# Patient Record
Sex: Male | Born: 1955 | Race: Black or African American | Hispanic: No | Marital: Single | Smoking: Current every day smoker
Health system: Southern US, Community
[De-identification: ages and names within clinical notes are randomized; demographics above are authoritative.]

## PROBLEM LIST (undated history)

## (undated) DIAGNOSIS — I480 Paroxysmal atrial fibrillation: Secondary | ICD-10-CM

## (undated) DIAGNOSIS — Z720 Tobacco use: Secondary | ICD-10-CM

## (undated) DIAGNOSIS — I2510 Atherosclerotic heart disease of native coronary artery without angina pectoris: Secondary | ICD-10-CM

## (undated) DIAGNOSIS — I493 Ventricular premature depolarization: Secondary | ICD-10-CM

## (undated) DIAGNOSIS — N529 Male erectile dysfunction, unspecified: Secondary | ICD-10-CM

## (undated) DIAGNOSIS — I739 Peripheral vascular disease, unspecified: Secondary | ICD-10-CM

## (undated) DIAGNOSIS — I10 Essential (primary) hypertension: Secondary | ICD-10-CM

## (undated) DIAGNOSIS — Z9289 Personal history of other medical treatment: Secondary | ICD-10-CM

## (undated) DIAGNOSIS — M109 Gout, unspecified: Secondary | ICD-10-CM

## (undated) DIAGNOSIS — I5022 Chronic systolic (congestive) heart failure: Secondary | ICD-10-CM

## (undated) DIAGNOSIS — E785 Hyperlipidemia, unspecified: Secondary | ICD-10-CM

## (undated) DIAGNOSIS — I4819 Other persistent atrial fibrillation: Secondary | ICD-10-CM

## (undated) HISTORY — DX: Male erectile dysfunction, unspecified: N52.9

## (undated) HISTORY — DX: Paroxysmal atrial fibrillation: I48.0

## (undated) HISTORY — DX: Peripheral vascular disease, unspecified: I73.9

## (undated) HISTORY — DX: Other persistent atrial fibrillation: I48.19

## (undated) HISTORY — PX: CARDIAC CATHETERIZATION: SHX172

## (undated) HISTORY — DX: Ventricular premature depolarization: I49.3

## (undated) HISTORY — DX: Essential (primary) hypertension: I10

## (undated) HISTORY — DX: Personal history of other medical treatment: Z92.89

---

## 2005-03-14 ENCOUNTER — Emergency Department: Payer: Self-pay | Admitting: General Practice

## 2005-04-09 ENCOUNTER — Other Ambulatory Visit: Payer: Self-pay

## 2005-04-09 ENCOUNTER — Emergency Department: Payer: Self-pay | Admitting: Emergency Medicine

## 2005-10-10 ENCOUNTER — Emergency Department: Payer: Self-pay | Admitting: Emergency Medicine

## 2006-07-20 ENCOUNTER — Emergency Department: Payer: Self-pay | Admitting: Emergency Medicine

## 2006-12-06 ENCOUNTER — Emergency Department: Payer: Self-pay | Admitting: Emergency Medicine

## 2007-05-06 ENCOUNTER — Emergency Department: Payer: Self-pay | Admitting: Emergency Medicine

## 2007-12-24 ENCOUNTER — Emergency Department: Payer: Self-pay | Admitting: Emergency Medicine

## 2008-02-01 ENCOUNTER — Emergency Department: Payer: Self-pay | Admitting: Emergency Medicine

## 2008-02-17 ENCOUNTER — Emergency Department: Payer: Self-pay | Admitting: Emergency Medicine

## 2008-09-02 ENCOUNTER — Emergency Department: Payer: Self-pay | Admitting: Emergency Medicine

## 2009-01-15 ENCOUNTER — Emergency Department: Payer: Self-pay | Admitting: Emergency Medicine

## 2009-01-15 ENCOUNTER — Emergency Department: Payer: Self-pay

## 2009-02-02 ENCOUNTER — Emergency Department: Payer: Self-pay | Admitting: Unknown Physician Specialty

## 2009-06-03 ENCOUNTER — Emergency Department: Payer: Self-pay | Admitting: Internal Medicine

## 2009-10-04 ENCOUNTER — Inpatient Hospital Stay: Payer: Self-pay | Admitting: Internal Medicine

## 2009-11-04 ENCOUNTER — Emergency Department: Payer: Self-pay | Admitting: Unknown Physician Specialty

## 2010-06-29 ENCOUNTER — Emergency Department: Payer: Self-pay | Admitting: Emergency Medicine

## 2010-07-18 ENCOUNTER — Emergency Department: Payer: Self-pay | Admitting: Emergency Medicine

## 2010-08-15 ENCOUNTER — Emergency Department: Payer: Self-pay | Admitting: Emergency Medicine

## 2010-08-16 ENCOUNTER — Emergency Department: Payer: Self-pay | Admitting: Emergency Medicine

## 2010-08-31 ENCOUNTER — Inpatient Hospital Stay: Payer: Self-pay | Admitting: Psychiatry

## 2010-11-01 ENCOUNTER — Emergency Department: Payer: Self-pay | Admitting: Unknown Physician Specialty

## 2010-11-11 ENCOUNTER — Emergency Department: Payer: Self-pay | Admitting: Unknown Physician Specialty

## 2010-12-17 ENCOUNTER — Emergency Department: Payer: Self-pay | Admitting: Emergency Medicine

## 2011-01-09 ENCOUNTER — Emergency Department: Payer: Self-pay | Admitting: Unknown Physician Specialty

## 2011-01-30 ENCOUNTER — Emergency Department: Payer: Self-pay | Admitting: Emergency Medicine

## 2011-02-10 ENCOUNTER — Inpatient Hospital Stay: Payer: Self-pay | Admitting: Internal Medicine

## 2011-02-10 DIAGNOSIS — I4891 Unspecified atrial fibrillation: Secondary | ICD-10-CM

## 2011-07-06 ENCOUNTER — Emergency Department: Payer: Self-pay | Admitting: Emergency Medicine

## 2011-07-06 LAB — COMPREHENSIVE METABOLIC PANEL
Albumin: 4.2 g/dL (ref 3.4–5.0)
BUN: 11 mg/dL (ref 7–18)
Calcium, Total: 8.7 mg/dL (ref 8.5–10.1)
Chloride: 105 mmol/L (ref 98–107)
Creatinine: 0.88 mg/dL (ref 0.60–1.30)
EGFR (African American): >60
EGFR (Non-African Amer.): >60
Glucose: 59 mg/dL — ABNORMAL LOW (ref 65–99)
Osmolality: 280 (ref 275–301)
SGOT(AST): 36 U/L (ref 15–37)
SGPT (ALT): 24 U/L
Total Protein: 8.2 g/dL (ref 6.4–8.2)

## 2011-07-06 LAB — CBC
HCT: 44 % (ref 40.0–52.0)
HGB: 14.9 g/dL (ref 13.0–18.0)
MCH: 32.2 pg (ref 26.0–34.0)
MCHC: 33.8 g/dL (ref 32.0–36.0)
MCV: 95 fL (ref 80–100)

## 2011-07-06 LAB — ETHANOL: Ethanol %: 0.327 % (ref 0.000–0.080)

## 2011-07-07 LAB — DRUG SCREEN, URINE
Cannabinoid 50 Ng, Ur ~~LOC~~: NEGATIVE (ref ?–50)
Cocaine Metabolite,Ur ~~LOC~~: NEGATIVE (ref ?–300)
MDMA (Ecstasy)Ur Screen: NEGATIVE (ref ?–500)
Phencyclidine (PCP) Ur S: NEGATIVE (ref ?–25)

## 2011-07-08 ENCOUNTER — Emergency Department: Payer: Self-pay | Admitting: Emergency Medicine

## 2011-07-08 LAB — ACETAMINOPHEN LEVEL: Acetaminophen: <2 ug/mL

## 2011-07-08 LAB — URINALYSIS, COMPLETE
Bacteria: NONE SEEN
Blood: NEGATIVE
Ketone: NEGATIVE
Protein: NEGATIVE
Specific Gravity: 1.002 (ref 1.003–1.030)
WBC UR: <1 /HPF (ref 0–5)

## 2011-07-08 LAB — DRUG SCREEN, URINE
Amphetamines, Ur Screen: NEGATIVE (ref ?–1000)
Barbiturates, Ur Screen: NEGATIVE (ref ?–200)
Benzodiazepine, Ur Scrn: NEGATIVE (ref ?–200)
Cannabinoid 50 Ng, Ur ~~LOC~~: NEGATIVE (ref ?–50)
Cocaine Metabolite,Ur ~~LOC~~: NEGATIVE (ref ?–300)
MDMA (Ecstasy)Ur Screen: NEGATIVE (ref ?–500)

## 2011-07-08 LAB — CBC
HGB: 14.5 g/dL (ref 13.0–18.0)
MCH: 32.3 pg (ref 26.0–34.0)
MCHC: 34.4 g/dL (ref 32.0–36.0)
RDW: 14.9 % — ABNORMAL HIGH (ref 11.5–14.5)

## 2011-07-08 LAB — COMPREHENSIVE METABOLIC PANEL
Alkaline Phosphatase: 81 U/L (ref 50–136)
BUN: 7 mg/dL (ref 7–18)
Bilirubin,Total: 0.3 mg/dL (ref 0.2–1.0)
Calcium, Total: 8.5 mg/dL (ref 8.5–10.1)
Chloride: 100 mmol/L (ref 98–107)
EGFR (Non-African Amer.): >60
Osmolality: 268 (ref 275–301)
Potassium: 3.6 mmol/L (ref 3.5–5.1)
SGOT(AST): 44 U/L — ABNORMAL HIGH (ref 15–37)
Sodium: 135 mmol/L — ABNORMAL LOW (ref 136–145)
Total Protein: 7.5 g/dL (ref 6.4–8.2)

## 2011-07-08 LAB — TSH: Thyroid Stimulating Horm: 1.18 u[IU]/mL

## 2011-07-09 LAB — ETHANOL
Ethanol %: <0.003 % (ref 0.000–0.080)
Ethanol: <3 mg/dL

## 2011-08-08 ENCOUNTER — Emergency Department: Payer: Self-pay | Admitting: Emergency Medicine

## 2011-08-08 LAB — URINALYSIS, COMPLETE
Bilirubin,UR: NEGATIVE
Blood: NEGATIVE
Glucose,UR: NEGATIVE mg/dL (ref 0–75)
Ketone: NEGATIVE
Ph: 6 (ref 4.5–8.0)
Squamous Epithelial: NONE SEEN
WBC UR: NONE SEEN /HPF (ref 0–5)

## 2011-08-08 LAB — CBC
HGB: 13.8 g/dL (ref 13.0–18.0)
MCH: 32.4 pg (ref 26.0–34.0)
MCHC: 34 g/dL (ref 32.0–36.0)
MCV: 95 fL (ref 80–100)
Platelet: 226 10*3/uL (ref 150–440)
RDW: 15.3 % — ABNORMAL HIGH (ref 11.5–14.5)

## 2011-08-08 LAB — COMPREHENSIVE METABOLIC PANEL
Albumin: 4.3 g/dL (ref 3.4–5.0)
Alkaline Phosphatase: 71 U/L (ref 50–136)
Calcium, Total: 8.9 mg/dL (ref 8.5–10.1)
Co2: 21 mmol/L (ref 21–32)
Creatinine: 0.85 mg/dL (ref 0.60–1.30)
EGFR (African American): >60
EGFR (Non-African Amer.): >60
Glucose: 69 mg/dL (ref 65–99)
Osmolality: 268 (ref 275–301)
SGOT(AST): 55 U/L — ABNORMAL HIGH (ref 15–37)

## 2011-08-08 LAB — DRUG SCREEN, URINE
Amphetamines, Ur Screen: NEGATIVE (ref ?–1000)
Benzodiazepine, Ur Scrn: NEGATIVE (ref ?–200)
Cannabinoid 50 Ng, Ur ~~LOC~~: NEGATIVE (ref ?–50)
Cocaine Metabolite,Ur ~~LOC~~: NEGATIVE (ref ?–300)
MDMA (Ecstasy)Ur Screen: NEGATIVE (ref ?–500)
Phencyclidine (PCP) Ur S: NEGATIVE (ref ?–25)
Tricyclic, Ur Screen: NEGATIVE (ref ?–1000)

## 2011-08-08 LAB — TROPONIN I: Troponin-I: 0.02 ng/mL

## 2011-08-08 LAB — SALICYLATE LEVEL: Salicylates, Serum: 5.2 mg/dL — ABNORMAL HIGH

## 2011-08-09 LAB — ETHANOL
Ethanol %: 0.125 % — ABNORMAL HIGH (ref 0.000–0.080)
Ethanol: 125 mg/dL

## 2011-09-07 ENCOUNTER — Emergency Department: Payer: Self-pay | Admitting: Emergency Medicine

## 2011-09-07 LAB — COMPREHENSIVE METABOLIC PANEL
Alkaline Phosphatase: 72 U/L (ref 50–136)
Anion Gap: 9 (ref 7–16)
BUN: 10 mg/dL (ref 7–18)
Co2: 27 mmol/L (ref 21–32)
Creatinine: 0.99 mg/dL (ref 0.60–1.30)
EGFR (Non-African Amer.): >60
Glucose: 80 mg/dL (ref 65–99)
Potassium: 4.7 mmol/L (ref 3.5–5.1)
SGPT (ALT): 44 U/L
Sodium: 140 mmol/L (ref 136–145)

## 2011-09-07 LAB — CBC
HCT: 43.3 % (ref 40.0–52.0)
HGB: 14.3 g/dL (ref 13.0–18.0)
MCH: 31.5 pg (ref 26.0–34.0)
MCHC: 33 g/dL (ref 32.0–36.0)
Platelet: 224 10*3/uL (ref 150–440)
RBC: 4.53 10*6/uL (ref 4.40–5.90)
WBC: 5.2 10*3/uL (ref 3.8–10.6)

## 2011-09-07 LAB — DRUG SCREEN, URINE
Barbiturates, Ur Screen: NEGATIVE (ref ?–200)
MDMA (Ecstasy)Ur Screen: NEGATIVE (ref ?–500)
Methadone, Ur Screen: NEGATIVE (ref ?–300)
Phencyclidine (PCP) Ur S: NEGATIVE (ref ?–25)
Tricyclic, Ur Screen: NEGATIVE (ref ?–1000)

## 2011-09-07 LAB — URINALYSIS, COMPLETE
Bacteria: NONE SEEN
Ph: 5 (ref 4.5–8.0)
Protein: NEGATIVE
Specific Gravity: 1.017 (ref 1.003–1.030)
Squamous Epithelial: 1
WBC UR: <1 /HPF (ref 0–5)

## 2011-09-07 LAB — ETHANOL: Ethanol: 5 mg/dL

## 2011-09-07 LAB — TSH: Thyroid Stimulating Horm: 0.334 u[IU]/mL — ABNORMAL LOW

## 2011-11-19 ENCOUNTER — Emergency Department: Payer: Self-pay | Admitting: Emergency Medicine

## 2011-12-05 ENCOUNTER — Inpatient Hospital Stay: Payer: Self-pay | Admitting: Internal Medicine

## 2011-12-05 LAB — COMPREHENSIVE METABOLIC PANEL
Albumin: 3.6 g/dL (ref 3.4–5.0)
Alkaline Phosphatase: 70 U/L (ref 50–136)
Calcium, Total: 8.5 mg/dL (ref 8.5–10.1)
Co2: 29 mmol/L (ref 21–32)
Creatinine: 1.42 mg/dL — ABNORMAL HIGH (ref 0.60–1.30)
Osmolality: 284 (ref 275–301)
Potassium: 4.1 mmol/L (ref 3.5–5.1)
SGOT(AST): 24 U/L (ref 15–37)
Sodium: 140 mmol/L (ref 136–145)
Total Protein: 6.9 g/dL (ref 6.4–8.2)

## 2011-12-05 LAB — PRO B NATRIURETIC PEPTIDE: B-Type Natriuretic Peptide: 157 pg/mL — ABNORMAL HIGH (ref 0–125)

## 2011-12-05 LAB — CK TOTAL AND CKMB (NOT AT ARMC)
CK, Total: 212 U/L (ref 35–232)
CK, Total: 168 U/L (ref 35–232)
CK-MB: 2.3 ng/mL (ref 0.5–3.6)
CK-MB: 2.5 ng/mL (ref 0.5–3.6)

## 2011-12-05 LAB — PROTIME-INR
INR: 0.8
Prothrombin Time: 11.2 secs — ABNORMAL LOW (ref 11.5–14.7)

## 2011-12-05 LAB — CBC
HCT: 43.4 % (ref 40.0–52.0)
HGB: 14.7 g/dL (ref 13.0–18.0)
MCHC: 33.9 g/dL (ref 32.0–36.0)
Platelet: 264 10*3/uL (ref 150–440)
RBC: 4.71 10*6/uL (ref 4.40–5.90)
RDW: 13.3 % (ref 11.5–14.5)

## 2011-12-06 LAB — LIPID PANEL
HDL Cholesterol: 34 mg/dL — ABNORMAL LOW (ref 40–60)
Ldl Cholesterol, Calc: 124 mg/dL — ABNORMAL HIGH (ref 0–100)
Triglycerides: 381 mg/dL — ABNORMAL HIGH (ref 0–200)
VLDL Cholesterol, Calc: 76 mg/dL — ABNORMAL HIGH (ref 5–40)

## 2011-12-06 LAB — CK TOTAL AND CKMB (NOT AT ARMC): CK-MB: 1.8 ng/mL (ref 0.5–3.6)

## 2011-12-06 LAB — TROPONIN I: Troponin-I: 0.03 ng/mL

## 2012-10-25 ENCOUNTER — Emergency Department: Payer: Self-pay | Admitting: Internal Medicine

## 2013-04-04 LAB — TSH: TSH: 1.13 u[IU]/mL (ref 0.41–5.90)

## 2013-04-16 ENCOUNTER — Emergency Department: Payer: Self-pay | Admitting: Emergency Medicine

## 2013-10-26 ENCOUNTER — Emergency Department: Payer: Self-pay | Admitting: Emergency Medicine

## 2013-10-26 LAB — URINALYSIS, COMPLETE
Bacteria: NONE SEEN
Bilirubin,UR: NEGATIVE
Blood: NEGATIVE
Glucose,UR: NEGATIVE mg/dL (ref 0–75)
Ketone: NEGATIVE
Nitrite: NEGATIVE
PH: 5 (ref 4.5–8.0)
PROTEIN: NEGATIVE
SQUAMOUS EPITHELIAL: NONE SEEN
Specific Gravity: 1.013 (ref 1.003–1.030)
WBC UR: 6 /HPF (ref 0–5)

## 2013-10-26 LAB — COMPREHENSIVE METABOLIC PANEL
ALBUMIN: 3.8 g/dL (ref 3.4–5.0)
AST: 28 U/L (ref 15–37)
Alkaline Phosphatase: 92 U/L
Anion Gap: 2 — ABNORMAL LOW (ref 7–16)
BUN: 14 mg/dL (ref 7–18)
Bilirubin,Total: 0.2 mg/dL (ref 0.2–1.0)
CO2: 28 mmol/L (ref 21–32)
Calcium, Total: 8.8 mg/dL (ref 8.5–10.1)
Chloride: 111 mmol/L — ABNORMAL HIGH (ref 98–107)
Creatinine: 1.2 mg/dL (ref 0.60–1.30)
EGFR (African American): >60
EGFR (Non-African Amer.): >60
Glucose: 63 mg/dL — ABNORMAL LOW (ref 65–99)
Osmolality: 280 (ref 275–301)
POTASSIUM: 3.9 mmol/L (ref 3.5–5.1)
SGPT (ALT): 25 U/L (ref 12–78)
Sodium: 141 mmol/L (ref 136–145)
Total Protein: 7.3 g/dL (ref 6.4–8.2)

## 2013-10-26 LAB — TROPONIN I
Troponin-I: <0.02 ng/mL
Troponin-I: <0.02 ng/mL

## 2013-10-26 LAB — CBC WITH DIFFERENTIAL/PLATELET
Basophil #: 0.1 10*3/uL (ref 0.0–0.1)
Basophil %: 1.1 %
Eosinophil #: 0.2 10*3/uL (ref 0.0–0.7)
Eosinophil %: 2.4 %
HCT: 46.7 % (ref 40.0–52.0)
HGB: 15.3 g/dL (ref 13.0–18.0)
Lymphocyte #: 3.1 10*3/uL (ref 1.0–3.6)
Lymphocyte %: 37.7 %
MCH: 29.8 pg (ref 26.0–34.0)
MCHC: 32.7 g/dL (ref 32.0–36.0)
MCV: 91 fL (ref 80–100)
Monocyte #: 0.7 x10 3/mm (ref 0.2–1.0)
Monocyte %: 8.6 %
NEUTROS PCT: 50.2 %
Neutrophil #: 4.1 10*3/uL (ref 1.4–6.5)
Platelet: 276 10*3/uL (ref 150–440)
RBC: 5.13 10*6/uL (ref 4.40–5.90)
RDW: 15.3 % — AB (ref 11.5–14.5)
WBC: 8.2 10*3/uL (ref 3.8–10.6)

## 2013-10-26 LAB — MAGNESIUM: Magnesium: 1.8 mg/dL

## 2013-10-26 LAB — CK TOTAL AND CKMB (NOT AT ARMC)
CK, TOTAL: 266 U/L
CK, Total: 237 U/L
CK-MB: 2.4 ng/mL (ref 0.5–3.6)
CK-MB: 2.1 ng/mL (ref 0.5–3.6)

## 2013-10-26 LAB — PROTIME-INR
INR: 0.9
Prothrombin Time: 11.9 secs (ref 11.5–14.7)

## 2013-10-26 LAB — TSH: Thyroid Stimulating Horm: 0.95 u[IU]/mL

## 2013-10-26 LAB — APTT: Activated PTT: 30.1 secs (ref 23.6–35.9)

## 2014-01-27 ENCOUNTER — Encounter (HOSPITAL_COMMUNITY): Payer: Self-pay | Admitting: Cardiovascular Disease

## 2014-01-27 ENCOUNTER — Ambulatory Visit: Payer: Self-pay | Admitting: Internal Medicine

## 2014-01-27 ENCOUNTER — Inpatient Hospital Stay (HOSPITAL_COMMUNITY)
Admission: EM | Admit: 2014-01-27 | Discharge: 2014-01-29 | DRG: 282 | Disposition: A | Discharge status: Home / Self Care | Payer: BC Managed Care – PPO | Attending: Cardiovascular Disease | Admitting: Cardiovascular Disease

## 2014-01-27 ENCOUNTER — Inpatient Hospital Stay
Admission: EM | Admit: 2014-01-27 | Payer: Self-pay | Source: Other Acute Inpatient Hospital | Admitting: Cardiovascular Disease

## 2014-01-27 ENCOUNTER — Encounter (HOSPITAL_COMMUNITY)
Admission: EM | Disposition: A | Discharge status: Home / Self Care | Payer: Self-pay | Source: Home / Self Care | Attending: Cardiovascular Disease

## 2014-01-27 ENCOUNTER — Inpatient Hospital Stay (HOSPITAL_COMMUNITY): Payer: BC Managed Care – PPO

## 2014-01-27 DIAGNOSIS — Z7901 Long term (current) use of anticoagulants: Secondary | ICD-10-CM

## 2014-01-27 DIAGNOSIS — Z7982 Long term (current) use of aspirin: Secondary | ICD-10-CM | POA: Diagnosis not present

## 2014-01-27 DIAGNOSIS — F1021 Alcohol dependence, in remission: Secondary | ICD-10-CM

## 2014-01-27 DIAGNOSIS — Z8249 Family history of ischemic heart disease and other diseases of the circulatory system: Secondary | ICD-10-CM

## 2014-01-27 DIAGNOSIS — I2109 ST elevation (STEMI) myocardial infarction involving other coronary artery of anterior wall: Principal | ICD-10-CM | POA: Diagnosis present

## 2014-01-27 DIAGNOSIS — R079 Chest pain, unspecified: Secondary | ICD-10-CM | POA: Diagnosis present

## 2014-01-27 DIAGNOSIS — F17200 Nicotine dependence, unspecified, uncomplicated: Secondary | ICD-10-CM | POA: Diagnosis present

## 2014-01-27 DIAGNOSIS — Z716 Tobacco abuse counseling: Secondary | ICD-10-CM

## 2014-01-27 DIAGNOSIS — Z720 Tobacco use: Secondary | ICD-10-CM | POA: Diagnosis present

## 2014-01-27 DIAGNOSIS — I214 Non-ST elevation (NSTEMI) myocardial infarction: Secondary | ICD-10-CM

## 2014-01-27 DIAGNOSIS — I480 Paroxysmal atrial fibrillation: Secondary | ICD-10-CM

## 2014-01-27 DIAGNOSIS — E785 Hyperlipidemia, unspecified: Secondary | ICD-10-CM | POA: Diagnosis present

## 2014-01-27 DIAGNOSIS — R7309 Other abnormal glucose: Secondary | ICD-10-CM | POA: Diagnosis present

## 2014-01-27 DIAGNOSIS — Z79899 Other long term (current) drug therapy: Secondary | ICD-10-CM

## 2014-01-27 DIAGNOSIS — I4891 Unspecified atrial fibrillation: Secondary | ICD-10-CM | POA: Diagnosis present

## 2014-01-27 DIAGNOSIS — I213 ST elevation (STEMI) myocardial infarction of unspecified site: Secondary | ICD-10-CM

## 2014-01-27 DIAGNOSIS — I2510 Atherosclerotic heart disease of native coronary artery without angina pectoris: Secondary | ICD-10-CM

## 2014-01-27 DIAGNOSIS — I10 Essential (primary) hypertension: Secondary | ICD-10-CM | POA: Diagnosis present

## 2014-01-27 HISTORY — PX: LEFT HEART CATH: SHX5478

## 2014-01-27 HISTORY — DX: Hyperlipidemia, unspecified: E78.5

## 2014-01-27 HISTORY — DX: Atherosclerotic heart disease of native coronary artery without angina pectoris: I25.10

## 2014-01-27 HISTORY — DX: Tobacco use: Z72.0

## 2014-01-27 LAB — CBC WITH DIFFERENTIAL/PLATELET
BASOS ABS: 0 10*3/uL (ref 0.0–0.1)
BASOS PCT: 0.6 %
Basophil #: 0 10*3/uL (ref 0.0–0.1)
Basophils Relative: 0 % (ref 0–1)
EOS ABS: 0.1 10*3/uL (ref 0.0–0.7)
EOS ABS: 0.1 10*3/uL (ref 0.0–0.7)
EOS PCT: 1.5 %
Eosinophils Relative: 1 % (ref 0–5)
HCT: 49.5 % (ref 40.0–52.0)
HCT: 42.6 % (ref 39.0–52.0)
HGB: 15.7 g/dL (ref 13.0–18.0)
Hemoglobin: 14.5 g/dL (ref 13.0–17.0)
LYMPHS ABS: 3.3 10*3/uL (ref 1.0–3.6)
LYMPHS ABS: 3.1 10*3/uL (ref 0.7–4.0)
LYMPHS PCT: 46.4 %
LYMPHS PCT: 47 % — AB (ref 12–46)
MCH: 29.3 pg (ref 26.0–34.0)
MCH: 29.8 pg (ref 26.0–34.0)
MCHC: 31.7 g/dL — AB (ref 32.0–36.0)
MCHC: 34 g/dL (ref 30.0–36.0)
MCV: 92 fL (ref 80–100)
MCV: 87.5 fL (ref 78.0–100.0)
Monocyte #: 0.6 x10 3/mm (ref 0.2–1.0)
Monocyte %: 8.1 %
Monocytes Absolute: 0.5 10*3/uL (ref 0.1–1.0)
Monocytes Relative: 8 % (ref 3–12)
NEUTROS ABS: 3.1 10*3/uL (ref 1.4–6.5)
NEUTROS PCT: 43.4 %
NEUTROS PCT: 44 % (ref 43–77)
Neutro Abs: 3 10*3/uL (ref 1.7–7.7)
Platelet: 284 10*3/uL (ref 150–440)
Platelets: 280 10*3/uL (ref 150–400)
RBC: 5.36 10*6/uL (ref 4.40–5.90)
RBC: 4.87 MIL/uL (ref 4.22–5.81)
RDW: 15.4 % — ABNORMAL HIGH (ref 11.5–14.5)
RDW: 16.2 % — AB (ref 11.5–15.5)
WBC: 7.1 10*3/uL (ref 3.8–10.6)
WBC: 6.8 10*3/uL (ref 4.0–10.5)

## 2014-01-27 LAB — COMPREHENSIVE METABOLIC PANEL
ALBUMIN: 3.6 g/dL (ref 3.4–5.0)
ALK PHOS: 77 U/L (ref 39–117)
ALT: 16 U/L (ref 0–53)
AST: 28 U/L (ref 15–37)
AST: 18 U/L (ref 0–37)
Albumin: 3.4 g/dL — ABNORMAL LOW (ref 3.5–5.2)
Alkaline Phosphatase: 87 U/L
Anion Gap: 8 (ref 7–16)
Anion gap: 10 (ref 5–15)
BUN: 10 mg/dL (ref 7–18)
BUN: 11 mg/dL (ref 6–23)
Bilirubin,Total: 0.4 mg/dL (ref 0.2–1.0)
CHLORIDE: 108 mmol/L — AB (ref 98–107)
CHLORIDE: 107 meq/L (ref 96–112)
CO2: 27 mmol/L (ref 21–32)
CO2: 23 mEq/L (ref 19–32)
Calcium, Total: 9 mg/dL (ref 8.5–10.1)
Calcium: 8.7 mg/dL (ref 8.4–10.5)
Creatinine, Ser: 0.94 mg/dL (ref 0.50–1.35)
Creatinine: 1.34 mg/dL — ABNORMAL HIGH (ref 0.60–1.30)
EGFR (Non-African Amer.): 58 — ABNORMAL LOW
GFR calc non Af Amer: >90 mL/min (ref >90)
GLUCOSE: 84 mg/dL (ref 70–99)
Glucose: 100 mg/dL — ABNORMAL HIGH (ref 65–99)
Osmolality: 284 (ref 275–301)
POTASSIUM: 4.1 meq/L (ref 3.7–5.3)
Potassium: 3.8 mmol/L (ref 3.5–5.1)
SGPT (ALT): 24 U/L
Sodium: 143 mmol/L (ref 136–145)
Sodium: 140 mEq/L (ref 137–147)
TOTAL PROTEIN: 7 g/dL (ref 6.4–8.2)
Total Bilirubin: 0.3 mg/dL (ref 0.3–1.2)
Total Protein: 6.1 g/dL (ref 6.0–8.3)

## 2014-01-27 LAB — CK TOTAL AND CKMB (NOT AT ARMC)
CK, TOTAL: 170 U/L
CK-MB: 1.8 ng/mL (ref 0.5–3.6)

## 2014-01-27 LAB — TROPONIN I
Troponin I: <0.3 ng/mL (ref <0.30)
Troponin I: <0.3 ng/mL (ref <0.30)
Troponin-I: <0.02 ng/mL

## 2014-01-27 LAB — PROTIME-INR
INR: 0.9
Prothrombin Time: 12.4 secs (ref 11.5–14.7)

## 2014-01-27 LAB — TSH: TSH: 0.64 u[IU]/mL (ref 0.350–4.500)

## 2014-01-27 LAB — MRSA PCR SCREENING: MRSA by PCR: NEGATIVE

## 2014-01-27 LAB — PRO B NATRIURETIC PEPTIDE: Pro B Natriuretic peptide (BNP): 145.5 pg/mL — ABNORMAL HIGH (ref 0–125)

## 2014-01-27 SURGERY — LEFT HEART CATH
Anesthesia: LOCAL | Laterality: Right

## 2014-01-27 MED ORDER — LIDOCAINE HCL (PF) 1 % IJ SOLN
INTRAMUSCULAR | Status: AC
  Filled 2014-01-27: qty 30

## 2014-01-27 MED ORDER — HEPARIN SODIUM (PORCINE) 1000 UNIT/ML IJ SOLN
INTRAMUSCULAR | Status: AC
  Filled 2014-01-27: qty 1

## 2014-01-27 MED ORDER — ACETAMINOPHEN 325 MG PO TABS: 650.0000 mg | ORAL_TABLET | ORAL | Status: DC | PRN

## 2014-01-27 MED ORDER — DILTIAZEM LOAD VIA INFUSION
20.0000 mg | Freq: Once | INTRAVENOUS | Status: AC
  Administered 2014-01-27: 20 mg via INTRAVENOUS
  Filled 2014-01-27: qty 20

## 2014-01-27 MED ORDER — DILTIAZEM HCL 60 MG PO TABS
60.0000 mg | ORAL_TABLET | Freq: Three times a day (TID) | ORAL | Status: DC
  Administered 2014-01-27 – 2014-01-28 (×2): 60 mg via ORAL
  Filled 2014-01-27 (×5): qty 1

## 2014-01-27 MED ORDER — HEPARIN (PORCINE) IN NACL 2-0.9 UNIT/ML-% IJ SOLN
INTRAMUSCULAR | Status: AC
  Filled 2014-01-27: qty 1000

## 2014-01-27 MED ORDER — ONDANSETRON HCL 4 MG/2ML IJ SOLN: 4.0000 mg | Freq: Four times a day (QID) | INTRAMUSCULAR | Status: DC | PRN

## 2014-01-27 MED ORDER — MIDAZOLAM HCL 2 MG/2ML IJ SOLN
INTRAMUSCULAR | Status: AC
  Filled 2014-01-27: qty 2

## 2014-01-27 MED ORDER — ASPIRIN EC 81 MG PO TBEC
81.0000 mg | DELAYED_RELEASE_TABLET | Freq: Every day | ORAL | Status: DC
  Administered 2014-01-27 – 2014-01-29 (×3): 81 mg via ORAL
  Filled 2014-01-27 (×3): qty 1

## 2014-01-27 MED ORDER — FENTANYL CITRATE 0.05 MG/ML IJ SOLN
INTRAMUSCULAR | Status: AC
  Filled 2014-01-27: qty 2

## 2014-01-27 MED ORDER — SODIUM CHLORIDE 0.9 % IV SOLN
INTRAVENOUS | Status: AC
  Administered 2014-01-27: 17:00:00 via INTRAVENOUS

## 2014-01-27 MED ORDER — METOPROLOL TARTRATE 1 MG/ML IV SOLN
INTRAVENOUS | Status: AC
  Filled 2014-01-27: qty 5

## 2014-01-27 MED ORDER — DILTIAZEM HCL 100 MG IV SOLR
5.0000 mg/h | INTRAVENOUS | Status: DC
  Administered 2014-01-27: 5 mg/h via INTRAVENOUS
  Filled 2014-01-27: qty 100

## 2014-01-27 MED ORDER — SODIUM CHLORIDE 0.9 % IV SOLN
INTRAVENOUS | Status: DC
  Administered 2014-01-27: 1000 mL via INTRAVENOUS

## 2014-01-27 MED ORDER — VERAPAMIL HCL 2.5 MG/ML IV SOLN
INTRAVENOUS | Status: AC
  Filled 2014-01-27: qty 2

## 2014-01-27 NOTE — ED Provider Notes (Signed)
Pt seen on arrival with dr cooper with cardiology Dr Excell Seltzer has decided to take patient emergently to cardiac cath lab. Pt awake/alert, stable for transport to cath lab   Joya Gaskins, MD 01/27/14 [phone]

## 2014-01-27 NOTE — Progress Notes (Addendum)
eLink Physician-Brief Progress Note Patient Name: Darren Rogers DOB: [birth year] MRN: [id number]   Date of Service  01/27/2014  HPI/Events of Note  Patient presented with palpatations and changes on EKG concerning for acute MI.  Patient also noted to have Afib.  Emergent cath performed and nonobstructive CAD found.  Patient on diltiazem drip and stable from hemodynamic and resp standpoint. Need for anticoagulation addressed by cardiology.  eICU Interventions  No acute interventions needed.  Chart reviewed.       Intervention Category Evaluation Type: New Patient Evaluation  Henry Russel, P 01/27/2014, 5:07 PM

## 2014-01-27 NOTE — H&P (Signed)
History and Physical  Patient ID: Darren Rogers MRN: [id number], SOB: 01-25-1956 58 y.o. Date of Encounter: 01/27/2014, 3:26 PM  Primary Physician: No primary provider on file. Primary Cardiologist: none  Chief Complaint: Chest pain/palpitations  HPI: 58 y.o. male w/ PMHx significant for palpitations who presented to [facility] on 01/27/2014 with complaints of chest pain.  The patient awoke this morning with palpitations. He has had this symptom periodically, last about 3 months ago. He has been told that he has atrial fibrillation in the past. The patient has never been on anticoagulant drugs. He went on to develop substernal chest discomfort this morning. He describes this as a pressure-like sensation. He went to the emergency department at [facility]. He was noted to have anteroseptal ST segment elevation and a code STEMI was called. The patient was given sublingual nitroglycerin and his chest pain improved with this. Upon arrival here, he is chest pain-free. Recheck another EKG but he continues to have residual ST segment elevation. We have elected to proceed with emergency cardiac catheterization and possible PCI.  The patient denies shortness of breath, orthopnea, or PND. He's had no diaphoresis, nausea, or vomiting. He denies any history of stroke or TIA. He denies any bleeding problems.  The patient takes simvastatin, diltiazem, and aspirin as his home medications. He denies any drug allergies.   Past Medical History  Diagnosis Date  . Hypertension   . Hyperlipidemia   . Tobacco abuse      Surgical History: No past surgical history on file.   Home Meds: Prior to Admission medications   Not on File    Allergies: Allergies not on file  History   Social History  . Marital Status: Single    Spouse Name: N/A    Number of Children: N/A  . Years of Education: N/A   Occupational History  . Not on file.   Social History Main Topics  .  Smoking status: Not on file  . Smokeless tobacco: Not on file  . Alcohol Use: Not on file  . Drug Use: Not on file  . Sexual Activity: Not on file   Other Topics Concern  . Not on file   Social History Narrative   The patient lives in [place] with his sister. He works in a substance abuse program. 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 He is a long-time smoker, one pack per day. No illicit drugs. He is not married.     Family History  Problem Relation Age of Onset  . Coronary artery disease Father 68    Review of Systems: General: negative for chills, fever, night sweats or weight changes.  ENT: negative for rhinorrhea or epistaxis Cardiovascular: See history of present illness Dermatological: negative for rash Respiratory: negative for cough or wheezing GI: negative for nausea, vomiting, diarrhea, bright red blood per rectum, melena, or hematemesis GU: no hematuria, urgency, or frequency Neurologic: negative for visual changes, syncope, headache, or dizziness Heme: no easy bruising or bleeding Endo: negative for excessive thirst, thyroid disorder, or flushing Musculoskeletal: negative for joint pain or swelling, negative for myalgias All other systems reviewed and are otherwise negative except as noted above.  Physical Exam: There were no vitals taken for this visit. General: Well developed, well nourished, alert and oriented, in no acute distress. HEENT: Normocephalic, atraumatic, sclera non-icteric, no xanthomas, nares are without discharge.  Neck: Supple. Carotids 2+ without bruits. JVP normal  Lungs: Clear bilaterally to auscultation without wheezes, rales, or rhonchi. Breathing is unlabored. Heart: RRR with normal S1 and S2. No murmurs, rubs, or gallops appreciated. Abdomen: Soft, non-tender, non-distended with normoactive bowel sounds. No hepatomegaly. No rebound/guarding. No obvious abdominal masses. Back: No CVA  tenderness Msk:  Strength and tone appear normal for age. Extremities: No clubbing, cyanosis, or edema.  Distal pedal pulses are 2+ and equal bilaterally. Neuro: CNII-XII intact, moves all extremities spontaneously. Psych:  Responds to questions appropriately with a normal affect.   Labs:   No results found for this basename: WBC,  HGB,  HCT,  MCV,  PLT   No results found for this basename: NA, K, CL, CO2, BUN, CREATININE, CALCIUM, LABALBU, PROT, BILITOT, ALKPHOS, ALT, AST, GLUCOSE,  in the last 168 hours No results found for this basename: CKTOTAL, CKMB, TROPONINI,  in the last 72 hours No results found for this basename: CHOL,  HDL,  LDLCALC,  TRIG   No results found for this basename: DDIMER    Radiology/Studies:  No results found.   EKG: Atrial fibrillation with rapid ventricular response, anteroseptal ST segment elevation consider acute injury. Diffuse ST/T-wave abnormality consider inferolateral ischemia.  ASSESSMENT AND PLAN:  58 year old gentleman with cardiovascular risk factors of hypertension, hyperlipidemia, and long-standing tobacco abuse, presenting with an acute anteroseptal myocardial infarction. His chest pain has resolved with nitroglycerin and heparin. However, he continues to have injury current on his EKG. We will proceed with emergency cardiac catheterization and possible PCI. I have reviewed the risks, indications, and alternatives with the patient. Emergency implied consent was obtained. Further plans pending cardiac catheterization results.  Atrial fibrillation with RVR. Will treat with either metoprolol or IV diltiazem depending on his stability following cardiac catheterization. He may ultimately require anticoagulation but we will need to further evaluate his history and medical compliance  Hypertension, essential. Currently stable.  Hyperlipidemia. Will check lipids and LFTs.  Tobacco abuse. Tobacco cessation counseling will be done.  Disposition: Pending  cardiac catheterization results.    Signed, Tonny Bollman MD 01/27/2014, 3:26 PM

## 2014-01-27 NOTE — CV Procedure (Signed)
    Cardiac Catheterization Procedure Note  Name: Jedediah Al MRN: [id number] DOB: 04-19-1956  Procedure: Left Heart Cath, Selective Coronary Angiography, LV angiography  Indication: ST segment elevation MI. This is a 58 year old gentleman who developed symptomatic atrial fibrillation this morning. He went to the [facility] emergency department and was found to have ST segment elevation in anteroseptal leads. He was referred emergently to the cardiac catheterization lab for diagnostic catheterization and possible PCI.   Procedural Details: The right wrist was prepped, draped, and anesthetized with 1% lidocaine. Using the modified Seldinger technique, a 5/6 French Slender sheath was introduced into the right radial artery. 3 mg of verapamil was administered through the sheath, weight-based unfractionated heparin was administered intravenously. Standard Judkins catheters were used for selective coronary angiography and left ventriculography. Catheter exchanges were performed over an exchange length guidewire. There were no immediate procedural complications. A TR band was used for radial hemostasis at the completion of the procedure.  The patient was transferred to the post catheterization recovery area for further monitoring.  Procedural Findings: Hemodynamics: AO 99/69 LV 96/10  Coronary angiography: Coronary dominance: Left  Left mainstem: Widely patent, arises from the left cusp, divides into the LAD and left circumflex. No obstruction noted.  Left anterior descending (LAD): Marked tortuosity noted. The LAD has mild diffuse disease without high-grade obstruction. The proximal vessel has luminal irregularities. The diagonal branches are patent. The mid vessel has 30-40% stenosis. The distal vessel is patent and it wraps around the LV apex.  Left circumflex (LCx): The left circumflex is tortuous. The vessel does a corkscrew up in its midportion. There are 3 obtuse  marginals and the PDA branch, none of which have significant obstruction. There are scattered 20-30% stenoses in the mid left circumflex.  Right coronary artery (RCA): Nondominant vessel, no obstructive disease identified.  Left ventriculography: LV function is hyperdynamic. The mid and distal ventricular chamber obliterates during systole. The LVEF is estimated at 70%. There is no significant mitral regurgitation.  Estimated Blood Loss: Minimal  Final Conclusions:   1. Mild diffuse nonobstructive coronary artery disease 2. Vigorous LV systolic function without regional wall motion abnormalities, but evidence of LVH  Recommendations: Will focus treatment on this patient's atrial fibrillation. His CHADS-Vasc score is 1. Would favor ASA alone. Start IV dilt for ventricular rate control.  Tonny Bollman MD, [facility] 01/27/2014, 4:02 PM

## 2014-01-27 NOTE — Progress Notes (Signed)
Chaplain responded to Code STEMI page for pt coming from [facility]. Chaplain called ED and learned that pt is in Cath Lab. Chaplain went to cath lab waiting area and found no family.

## 2014-01-27 NOTE — Progress Notes (Signed)
UR Completed.  Lautaro Koral Jane [phone] 01/27/2014  

## 2014-01-27 NOTE — Interval H&P Note (Signed)
Cath Lab Visit (complete for each Cath Lab visit)  Clinical Evaluation Leading to the Procedure:   ACS: Yes.    Non-ACS:    Anginal Classification: CCS IV  Anti-ischemic medical therapy: Minimal Therapy (1 class of medications)  Non-Invasive Test Results: No non-invasive testing performed  Prior CABG: No previous CABG      History and Physical Interval Note:  01/27/2014 3:30 PM  Darren Rogers  has presented today for surgery, with the diagnosis of STEMI  The various methods of treatment have been discussed with the patient and family. After consideration of risks, benefits and other options for treatment, the patient has consented to  Procedure(s): LEFT HEART CATH (Right) as a surgical intervention .  The patient's history has been reviewed, patient examined, no change in status, stable for surgery.  I have reviewed the patient's chart and labs.  Questions were answered to the patient's satisfaction.     Tonny Bollman

## 2014-01-28 DIAGNOSIS — I4891 Unspecified atrial fibrillation: Secondary | ICD-10-CM

## 2014-01-28 DIAGNOSIS — I219 Acute myocardial infarction, unspecified: Secondary | ICD-10-CM

## 2014-01-28 LAB — BASIC METABOLIC PANEL
Anion gap: 13 (ref 5–15)
BUN: 15 mg/dL (ref 6–23)
CALCIUM: 9.4 mg/dL (ref 8.4–10.5)
CO2: 21 mEq/L (ref 19–32)
CREATININE: 1.04 mg/dL (ref 0.50–1.35)
Chloride: 106 mEq/L (ref 96–112)
GFR calc Af Amer: 90 mL/min — ABNORMAL LOW (ref >90)
GFR calc non Af Amer: 77 mL/min — ABNORMAL LOW (ref >90)
Glucose, Bld: 93 mg/dL (ref 70–99)
POTASSIUM: 4.3 meq/L (ref 3.7–5.3)
SODIUM: 140 meq/L (ref 137–147)

## 2014-01-28 LAB — LIPID PANEL
CHOL/HDL RATIO: 6 ratio
Cholesterol: 191 mg/dL (ref 0–200)
HDL: 32 mg/dL — ABNORMAL LOW (ref >39)
LDL CALC: 114 mg/dL — AB (ref 0–99)
Triglycerides: 226 mg/dL — ABNORMAL HIGH (ref <150)
VLDL: 45 mg/dL — ABNORMAL HIGH (ref 0–40)

## 2014-01-28 LAB — T4, FREE: Free T4: 0.93 ng/dL (ref 0.80–1.80)

## 2014-01-28 LAB — HEMOGLOBIN A1C
Hgb A1c MFr Bld: 6.3 % — ABNORMAL HIGH (ref <5.7)
MEAN PLASMA GLUCOSE: 134 mg/dL — AB (ref <117)

## 2014-01-28 LAB — TROPONIN I

## 2014-01-28 MED ORDER — NICOTINE 14 MG/24HR TD PT24
14.0000 mg | MEDICATED_PATCH | Freq: Every day | TRANSDERMAL | Status: DC
  Administered 2014-01-28 – 2014-01-29 (×2): 14 mg via TRANSDERMAL
  Filled 2014-01-28 (×2): qty 1

## 2014-01-28 MED ORDER — DILTIAZEM HCL ER COATED BEADS 180 MG PO CP24
180.0000 mg | ORAL_CAPSULE | Freq: Every day | ORAL | Status: DC
  Administered 2014-01-28 – 2014-01-29 (×2): 180 mg via ORAL
  Filled 2014-01-28 (×2): qty 1

## 2014-01-28 MED ORDER — RIVAROXABAN 20 MG PO TABS
20.0000 mg | ORAL_TABLET | Freq: Every day | ORAL | Status: DC
  Administered 2014-01-28: 20 mg via ORAL
  Filled 2014-01-28 (×2): qty 1

## 2014-01-28 NOTE — Progress Notes (Signed)
Report called to 3W. Preparing to transfer pt now.  Emotional support provided.

## 2014-01-28 NOTE — Progress Notes (Addendum)
SUBJECTIVE:  No complaints  OBJECTIVE:   Vitals:   Filed Vitals:   01/28/14 0500 01/28/14 0600 01/28/14 0700 01/28/14 0728  BP: 96/64 95/72 100/64   Pulse: 78 92 83 93  Temp:      TempSrc:      Resp: 16 14 16 17   Height:      Weight:      SpO2: 97% 98% 99% 97%   I&O's:   Intake/Output Summary (Last 24 hours) at 01/28/14 0838 Last data filed at 01/28/14 0600  Gross per 24 hour  Intake 1051.25 ml  Output    550 ml  Net 501.25 ml   TELEMETRY: Reviewed telemetry pt in atrial fibrillation with CVR in the 90's     PHYSICAL EXAM General: Well developed, well nourished, in no acute distress Head: Eyes PERRLA, No xanthomas.   Normal cephalic and atramatic  Lungs:   Clear bilaterally to auscultation and percussion. Heart:   Irregularly irregular S1 S2 Pulses are 2+ & equal. Abdomen: Bowel sounds are positive, abdomen soft and non-tender without masses Extremities:   No clubbing, cyanosis or edema.  DP +1.  Right radial artery cath site without hematoma Neuro: Alert and oriented X 3. Psych:  Good affect, responds appropriately   LABS: Basic Metabolic Panel:  Recent Labs  52/08/02 1705 01/28/14 0431  NA 140 140  K 4.1 4.3  CL 107 106  CO2 23 21  GLUCOSE 84 93  BUN 11 15  CREATININE 0.94 1.04  CALCIUM 8.7 9.4   Liver Function Tests:  Recent Labs  01/27/14 1705  AST 18  ALT 16  ALKPHOS 77  BILITOT 0.3  PROT 6.1  ALBUMIN 3.4*   No results found for this basename: LIPASE, AMYLASE,  in the last 72 hours CBC:  Recent Labs  01/27/14 1705  WBC 6.8  NEUTROABS 3.0  HGB 14.5  HCT 42.6  MCV 87.5  PLT 280   Cardiac Enzymes:  Recent Labs  01/27/14 1705 01/27/14 2214 01/28/14 0431  TROPONINI <0.30 <0.30 <0.30   BNP: No components found with this basename: POCBNP,  D-Dimer: No results found for this basename: DDIMER,  in the last 72 hours Hemoglobin A1C: No results found for this basename: HGBA1C,  in the last 72 hours Fasting Lipid  Panel:  Recent Labs  01/28/14 0431  CHOL 191  HDL 32*  LDLCALC 114*  TRIG 226*  CHOLHDL 6.0   Thyroid Function Tests:  Recent Labs  01/27/14 1705  TSH 0.640   Anemia Panel: No results found for this basename: VITAMINB12, FOLATE, FERRITIN, TIBC, IRON, RETICCTPCT,  in the last 72 hours Coag Panel:   No results found for this basename: INR, PROTIME    RADIOLOGY: Portable Chest X-ray 1 View  01/27/2014   CLINICAL DATA:  Atrial fibrillation.  Shortness breath.  EXAM: PORTABLE CHEST - 1 VIEW  COMPARISON:  Chest x-ray 01/27/2014.  FINDINGS: Previously noted transcutaneous defibrillator pads been removed. Lung volumes are normal. No consolidative airspace disease. No pleural effusions. No pneumothorax. No pulmonary nodule or mass noted. Pulmonary vasculature and the cardiomediastinal silhouette are within normal limits. Atherosclerosis in the thoracic aorta.  IMPRESSION: 1. No radiographic evidence of acute cardiopulmonary disease. 2. Atherosclerosis.   Electronically Signed   By: Trudie Reed M.D.   On: 01/27/2014 17:22   ASSESSMENT AND PLAN:  58 year old gentleman with cardiovascular risk factors of hypertension, hyperlipidemia, and long-standing tobacco abuse, presenting with chest pain.  1.  Acute chest pain with STEMI in anteroseptal  leads- cath showed mild diffuse nonobstructive ASCAD with normal LVF and LVH.  Cardiac enzymes negative 2.  Atrial fibrillation with RVR - now rate controlled but remains in atrial fibrillation.  Will change Cardizem to CD  daily.  Start Xarelto  now and then daily. Will plan for TEE DCCV on Tuesday after his 3rd dose of Xarelto.  Of note he is on Cardizem at home and says that he has been having palpitations despite taking it.  He does not remember the dose so he will have his girlfriend bring it in.  We may need to consider adding antiarrhythmic therapy since he is having breakthrough on home dose of cardizem. 3.  Hypertension - controlled and  now on the soft side 4.  Hyperlipidemia - LDL is 114 - he is on simvastatin at home but he does not know the dose.  He is going to have his girlfriend bring his meds in so we can see what dose he is on and then adjust the dose upward to get LDL <70 5.  Tobacco abuse. Tobacco cessation counseling will be done.   Patient is stable for transfer to tele bed  Quintella Reichert, MD  01/28/2014  8:38 AM

## 2014-01-29 ENCOUNTER — Encounter: Payer: Self-pay | Admitting: Cardiovascular Disease

## 2014-01-29 ENCOUNTER — Encounter (HOSPITAL_COMMUNITY): Payer: Self-pay | Admitting: Physician Assistant

## 2014-01-29 DIAGNOSIS — Z7189 Other specified counseling: Secondary | ICD-10-CM

## 2014-01-29 DIAGNOSIS — F17200 Nicotine dependence, unspecified, uncomplicated: Secondary | ICD-10-CM

## 2014-01-29 DIAGNOSIS — Z720 Tobacco use: Secondary | ICD-10-CM | POA: Diagnosis present

## 2014-01-29 DIAGNOSIS — I214 Non-ST elevation (NSTEMI) myocardial infarction: Secondary | ICD-10-CM

## 2014-01-29 DIAGNOSIS — E785 Hyperlipidemia, unspecified: Secondary | ICD-10-CM | POA: Diagnosis present

## 2014-01-29 DIAGNOSIS — I213 ST elevation (STEMI) myocardial infarction of unspecified site: Secondary | ICD-10-CM

## 2014-01-29 LAB — POCT I-STAT, CHEM 8
BUN: 10 mg/dL (ref 6–23)
Calcium, Ion: 1.28 mmol/L — ABNORMAL HIGH (ref 1.12–1.23)
Chloride: 105 mEq/L (ref 96–112)
Creatinine, Ser: 1 mg/dL (ref 0.50–1.35)
Glucose, Bld: 87 mg/dL (ref 70–99)
HEMATOCRIT: 45 % (ref 39.0–52.0)
HEMOGLOBIN: 15.3 g/dL (ref 13.0–17.0)
POTASSIUM: 3.7 meq/L (ref 3.7–5.3)
SODIUM: 139 meq/L (ref 137–147)
TCO2: 23 mmol/L (ref 0–100)

## 2014-01-29 MED ORDER — ATORVASTATIN CALCIUM 80 MG PO TABS: 80.0000 mg | ORAL_TABLET | Freq: Every day | ORAL | Status: DC

## 2014-01-29 MED ORDER — RIVAROXABAN 20 MG PO TABS: 20.0000 mg | ORAL_TABLET | Freq: Every day | ORAL | Status: DC

## 2014-01-29 MED ORDER — NICOTINE 14 MG/24HR TD PT24: 14.0000 mg | MEDICATED_PATCH | Freq: Every day | TRANSDERMAL | Status: DC

## 2014-01-29 MED ORDER — DILTIAZEM HCL ER COATED BEADS 180 MG PO CP24: 180.0000 mg | ORAL_CAPSULE | Freq: Every day | ORAL | Status: DC

## 2014-01-29 NOTE — Discharge Summary (Signed)
Discharge Summary   Patient ID: Darren Rogers MRN: [id number], DOB/AGE: 59/21/1957 59 y.o. Admit date: 01/27/2014 D/C date:     01/29/2014  Primary Cardiologist: Dr. Kirke Rogers (new)  Principal Problem:   STEMI (ST elevation myocardial infarction) Active Problems:   Atrial fibrillation with rapid ventricular response   CAD (coronary artery disease)   Tobacco abuse   Hyperlipidemia   Hypertension   Admission Dates: 01/27/14-01/29/14 Discharge Diagnosis: atrial fibrillation with RVR and acute anteroseptal STEMI s/p LHC with mild diffuse nonobstructive CAD with no interventions.   HPI: Darren Rogers is a 58 y.o. male with a history of hypertension, hyperlipidemia, paroxysmal atrial fibrillation and long-standing tobacco abuse who presented to [facility] on 01/27/14 with chest pain and palpitations. He was noted to be in atrial fibrillation with RVR and have anteroseptal ST segment elevation and a code STEMI was called. He was transferred to [facility] for an emergent cardiac catheterization.   Hospital Course  Acute chest pain with STEMI in anteroseptal leads- cath showed mild diffuse nonobstructive ASCAD with normal LVF and LVH. Cardiac enzymes negative  -- Continue ASA, statin and BB  Atrial fibrillation with RVR - spontaneously converted to NSR on 01/28/14 during the night.  -- Currently on Cardizem to CD 180mg  daily and he was started on Xarelto on 01/28/14.  -- The plan was for TEE DCCV on Tuesday after his 3rd dose of Xarelto, but he spontaneously converted into NSR. Of note he was on Cardizem 120mg  at home and reported having palpitations despite taking it. Per Dr. Mayford Rogers "we may need to consider adding antiarrhythmic therapy since he is having breakthrough on home dose of cardizem. " His Cardizem was increased to 180mg  qd. If he continues to have break through palpitations on this dose then antiarrythmic therapy can be addressed as an outpatient.  Hypertension - controlled and have been on the soft  side. Continue Cardizem at 180 mg qd  Hyperlipidemia - TC 191; TG 226; HDL 32; LDL is 114 - he was on simvastatin 20mg  at home. Will change simvastatin to atorvastatin 80mg  to allow for more aggressive therapy particularly with his atherogenic lipid profile  Tobacco abuse. Tobacco cessation counseling - he states he is not willing to quit but will slow down some. Girlfriend offered to quit with him as well. Dr. Tresa Rogers stressed importance of quitting and I have sent him home with nicotine patches.   Pre- Diabetes- HgA1c 6.3. Counseled on diet and exercise. He will follow up with PCP about this.   The patient has had an uncomplicated hospital course and is recovering well. The radial catheter site is stable. He has been seen by Dr. Tresa Rogers today and deemed ready for discharge home. All follow-up appointments have been scheduled. Smoking cessation was disscussed in length. A work note was provided for the patient. He will stay out of work and abstain from sexual activity for 1 week or until he is medically cleared by Dr. Kirke Rogers on 02/06/14 at his follow up appointment.  Discharge medications are listed below.    Discharge Vitals: Blood pressure 111/66, pulse 60, temperature 97.8 F (36.6 C), temperature source Oral, resp. rate 18, height 5\' 6"  (1.676 m), weight 163 lb 2.3 oz (74 kg), SpO2 99.00%.  Labs: Lab Results  Component Value Date   WBC 6.8 01/27/2014   HGB 14.5 01/27/2014   HCT 42.6 01/27/2014   MCV 87.5 01/27/2014   PLT 280 01/27/2014     Recent Labs Lab 01/27/14 1705 01/28/14 0431  NA 140 140  K 4.1 4.3  CL 107 106  CO2 23 21  BUN 11 15  CREATININE 0.94 1.04  CALCIUM 8.7 9.4  PROT 6.1  --   BILITOT 0.3  --   ALKPHOS 77  --   ALT 16  --   AST 18  --   GLUCOSE 84 93    Recent Labs  01/27/14 1705 01/27/14 2214 01/28/14 0431  TROPONINI <0.30 <0.30 <0.30   Lab Results  Component Value Date   CHOL 191 01/28/2014   HDL 32* 01/28/2014   LDLCALC 114* 01/28/2014   TRIG 226*  01/28/2014     Diagnostic Studies/Procedures   Portable Chest X-ray 1 View  01/27/2014   CLINICAL DATA:  Atrial fibrillation.  Shortness breath.  EXAM: PORTABLE CHEST - 1 VIEW  COMPARISON:  Chest x-ray 01/27/2014.  FINDINGS: Previously noted transcutaneous defibrillator pads been removed. Lung volumes are normal. No consolidative airspace disease. No pleural effusions. No pneumothorax. No pulmonary nodule or mass noted. Pulmonary vasculature and the cardiomediastinal silhouette are within normal limits. Atherosclerosis in the thoracic aorta.  IMPRESSION: 1. No radiographic evidence of acute cardiopulmonary disease. 2. Atherosclerosis.      Cardiac Catheterization Procedure Note  Name: Darren Rogers  MRN: [id number]  DOB: 31-May-1956  Procedure: Left Heart Cath, Selective Coronary Angiography, LV angiography  Indication: ST segment elevation MI. This is a 58 year old gentleman who developed symptomatic atrial fibrillation this morning. He went to the [facility] emergency department and was found to have ST segment elevation in anteroseptal leads. He was referred emergently to the cardiac catheterization lab for diagnostic catheterization and possible PCI.  Procedural Details: The right wrist was prepped, draped, and anesthetized with 1% lidocaine. Using the modified Seldinger technique, a 5/6 French Slender sheath was introduced into the right radial artery. 3 mg of verapamil was administered through the sheath, weight-based unfractionated heparin was administered intravenously. Standard Judkins catheters were used for selective coronary angiography and left ventriculography. Catheter exchanges were performed over an exchange length guidewire. There were no immediate procedural complications. A TR band was used for radial hemostasis at the completion of the procedure. The patient was transferred to the post catheterization recovery area for further monitoring.  Procedural Findings:    Hemodynamics:  AO 99/69  LV 96/10  Coronary angiography:  Coronary dominance: Left  Left mainstem: Widely patent, arises from the left cusp, divides into the LAD and left circumflex. No obstruction noted.  Left anterior descending (LAD): Marked tortuosity noted. The LAD has mild diffuse disease without high-grade obstruction. The proximal vessel has luminal irregularities. The diagonal branches are patent. The mid vessel has 30-40% stenosis. The distal vessel is patent and it wraps around the LV apex.  Left circumflex (LCx): The left circumflex is tortuous. The vessel does a corkscrew up in its midportion. There are 3 obtuse marginals and the PDA branch, none of which have significant obstruction. There are scattered 20-30% stenoses in the mid left circumflex.  Right coronary artery (RCA): Nondominant vessel, no obstructive disease identified.  Left ventriculography: LV function is hyperdynamic. The mid and distal ventricular chamber obliterates during systole. The LVEF is estimated at 70%. There is no significant mitral regurgitation.  Estimated Blood Loss: Minimal  Final Conclusions:  1. Mild diffuse nonobstructive coronary artery disease  2. Vigorous LV systolic function without regional wall motion abnormalities, but evidence of LVH  Recommendations: Will focus treatment on this patient's atrial fibrillation. His CHADS-Vasc score is 1. Would favor ASA  alone. Start IV dilt for ventricular rate control.      Discharge Medications     Medication List    STOP taking these medications       BC HEADACHE POWDER PO     ibuprofen 200 MG tablet  Commonly known as:  ADVIL,MOTRIN     simvastatin 20 MG tablet  Commonly known as:  ZOCOR      TAKE these medications       aspirin 81 MG tablet  Take 81 mg by mouth daily.     atorvastatin 80 MG tablet  Commonly known as:  LIPITOR  Take 1 tablet (80 mg total) by mouth daily.     diltiazem 180 MG 24 hr capsule  Commonly known as:   CARDIZEM CD  Take 1 capsule (180 mg total) by mouth daily.     multivitamin tablet  Take 1 tablet by mouth daily.     nicotine 14 mg/24hr patch  Commonly known as:  NICODERM CQ - dosed in mg/24 hours  Place 1 patch (14 mg total) onto the skin daily.     rivaroxaban 20 MG Tabs tablet  Commonly known as:  XARELTO  Take 1 tablet (20 mg total) by mouth daily with supper.        Disposition   The patient will be discharged in stable condition to home.  Follow-up Information   Follow up with Lorine Bears, MD On 02/06/2014. (3:30pm)    Specialty:  Cardiology   Contact information:   [address] [place] [address] [phone]         Duration of Discharge Encounter: Greater than 30 minutes including physician and PA time.  Byrd Hesselbach R PA-C 01/29/2014, 11:54 AM

## 2014-01-29 NOTE — Care Management Note (Signed)
    Page 1 of 1   01/29/2014     12:36:24 PM CARE MANAGEMENT NOTE 01/29/2014  Patient:  Darren Rogers, Darren Rogers   Account Number:  [id number]  Date Initiated:  01/29/2014  Documentation initiated by:  GRAVES-BIGELOW,Jakaiya Netherland  Subjective/Objective Assessment:   Pt admitted for stemi. Plan for home on xarelto.     Action/Plan:   CM provided pt with coupon for 12 month @ no cost. Pt to call and activate. No furhter needs from CM at this time.   Anticipated DC Date:  01/29/2014   Anticipated DC Plan:  HOME/SELF CARE      DC Planning Services  CM consult      Choice offered to / List presented to:             Status of service:  Completed, signed off Medicare Important Message given?  NO (If response is "NO", the following Medicare IM given date fields will be blank) Date Medicare IM given:   Medicare IM given by:   Date Additional Medicare IM given:   Additional Medicare IM given by:    Discharge Disposition:  HOME/SELF CARE  Per UR Regulation:  Reviewed for med. necessity/level of care/duration of stay  If discussed at Long Length of Stay Meetings, dates discussed:    Comments:

## 2014-01-29 NOTE — Discharge Instructions (Signed)
Radial Site Care Refer to this sheet in the next few weeks. These instructions provide you with information on caring for yourself after your procedure. Your caregiver may also give you more specific instructions. Your treatment has been planned according to current medical practices, but problems sometimes occur. Call your caregiver if you have any problems or questions after your procedure. HOME CARE INSTRUCTIONS  You may shower the day after the procedure.Remove the bandage (dressing) and gently wash the site with plain soap and water.Gently pat the site dry.   Do not apply powder or lotion to the site.   Do not submerge the affected site in water for 3 to 5 days.   Inspect the site at least twice daily.   Do not flex or bend the affected arm for 24 hours.   No lifting over 5 pounds (2.3 kg) for 5 days after your procedure.   Do not drive home if you are discharged the same day of the procedure. Have someone else drive you.   You may drive 24 hours after the procedure unless otherwise instructed by your caregiver.   No sexual activity for 1 week What to expect:  Any bruising will usually fade within 1 to 2 weeks.   Blood that collects in the tissue (hematoma) may be painful to the touch. It should usually decrease in size and tenderness within 1 to 2 weeks.  SEEK IMMEDIATE MEDICAL CARE IF:  You have unusual pain at the radial site.   You have redness, warmth, swelling, or pain at the radial site.   You have drainage (other than a small amount of blood on the dressing).   You have chills.   You have a fever or persistent symptoms for more than 72 hours.   You have a fever and your symptoms suddenly get worse.   Your arm becomes pale, cool, tingly, or numb.   You have heavy bleeding from the site. Hold pressure on the site.

## 2014-01-29 NOTE — Progress Notes (Signed)
Patient Name: Darren Rogers Date of Encounter: 01/29/2014     Active Problems:   Atrial fibrillation   Atrial fibrillation with rapid ventricular response    SUBJECTIVE  Wants to go home. No more chest pressure or palpitations.   CURRENT MEDS . aspirin EC  81 mg Oral Daily  . diltiazem  180 mg Oral Daily  . nicotine  14 mg Transdermal Daily  . rivaroxaban  20 mg Oral Q supper    OBJECTIVE  Filed Vitals:   01/28/14 1018 01/28/14 1515 01/28/14 2055 01/29/14 0500  BP: 97/53 100/65 112/65 111/66  Pulse: 125 58 60 60  Temp: 98.3 F (36.8 C) 98.2 F (36.8 C) 98.3 F (36.8 C) 97.8 F (36.6 C)  TempSrc: Oral Oral Oral Oral  Resp: Height:      Weight:    163 lb 2.3 oz (74 kg)  SpO2: 96% 98% 99% 99%    Intake/Output Summary (Last 24 hours) at 01/29/14 1016 Last data filed at 01/29/14 0945  Gross per 24 hour  Intake    720 ml  Output      0 ml  Net    720 ml   Filed Weights   01/27/14 1620 01/28/14 0400 01/29/14 0500  Weight: 162 lb 7.7 oz (73.7 kg) 163 lb 2.3 oz (74 kg) 163 lb 2.3 oz (74 kg)    PHYSICAL EXAM  General: Pleasant, NAD. Neuro: Alert and oriented X 3. Moves all extremities spontaneously. Psych: Normal affect. HEENT:  Normal  Neck: Supple without bruits or JVD. Lungs:  Resp regular and unlabored, CTA. Heart: RRR no s3, s4, or murmurs. Abdomen: Soft, non-tender, non-distended, BS + x 4.  Extremities: No clubbing, cyanosis or edema. DP/PT/Radials 2+ and equal bilaterally.  Accessory Clinical Findings  CBC  Recent Labs  01/27/14 1705  WBC 6.8  NEUTROABS 3.0  HGB 14.5  HCT 42.6  MCV 87.5  PLT 280   Basic Metabolic Panel  Recent Labs  01/27/14 1705 01/28/14 0431  NA 140 140  K 4.1 4.3  CL 107 106  CO2 23 21  GLUCOSE 84 93  BUN 11 15  CREATININE 0.94 1.04  CALCIUM 8.7 9.4   Liver Function Tests  Recent Labs  01/27/14 1705  AST 18  ALT 16  ALKPHOS 77  BILITOT 0.3  PROT 6.1  ALBUMIN 3.4*    Cardiac  Enzymes  Recent Labs  01/27/14 1705 01/27/14 2214 01/28/14 0431  TROPONINI <0.30 <0.30 <0.30   Hemoglobin A1C  Recent Labs  01/27/14 1705  HGBA1C 6.3*   Fasting Lipid Panel  Recent Labs  01/28/14 0431  CHOL 191  HDL 32*  LDLCALC 114*  TRIG 226*  CHOLHDL 6.0   Thyroid Function Tests  Recent Labs  01/27/14 1705  TSH 0.640    TELE  NSR   Radiology/Studies  Portable Chest X-ray 1 View  01/27/2014   CLINICAL DATA:  Atrial fibrillation.  Shortness breath.  EXAM: PORTABLE CHEST - 1 VIEW  COMPARISON:  Chest x-ray 01/27/2014.  FINDINGS: Previously noted transcutaneous defibrillator pads been removed. Lung volumes are normal. No consolidative airspace disease. No pleural effusions. No pneumothorax. No pulmonary nodule or mass noted. Pulmonary vasculature and the cardiomediastinal silhouette are within normal limits. Atherosclerosis in the thoracic aorta.  IMPRESSION: 1. No radiographic evidence of acute cardiopulmonary disease. 2. Atherosclerosis.     ASSESSMENT AND PLAN 58 year old gentleman with a history of hypertension, hyperlipidemia, atrial fibrillation and long-standing tobacco abuse who presented  to [facility] on 01/27/14 with chest pain and palpitations. He was noted to have anteroseptal ST segment elevation and a code STEMI was called. He was transferred to [facility] for an emergent cardiac catheterization.   Acute chest pain with STEMI in anteroseptal leads- cath showed mild diffuse nonobstructive ASCAD with normal LVF and LVH. Cardiac enzymes negative   Atrial fibrillation with RVR - spontaneously converted to NSR sometime last night.  -- Currently on Cardizem to CD 180mg  daily and he was started on Xarelto yesterday.  -- The plan was for TEE DCCV on Tuesday after his 3rd dose of Xarelto, but he has now converted into NSR. Of note he was on Cardizem 120mg  at home and reported having palpitations despite taking it. Per Dr. Mayford Knife "we may need to consider adding antiarrhythmic  therapy since he is having breakthrough on home dose of cardizem. "  Hypertension - controlled and now on the soft side   Hyperlipidemia - TC 191; TG 226; HDL 32; LDL is 114 - he is on simvastatin 20mg  at home. Consider titrating dose upward to get LDL <70   Tobacco abuse. Tobacco cessation counseling - he states he is not willing to quit but will slow down some. Girlfriend offered to quit with him as well.   Dispo- Wants to go home. Asks when he is cleared for sexual activity. MD to see.    Billy Fischer PA-C  Pager [phone]   Patient seen and examined. Agree with assessment and plan. Converted to sinus ryhthm. Continue cardizem at 180 mg. Discussed complete smoking cessation. Will change simvastatin to atorvastatin to allow for more aggressive therapy particularly with his atherogenic lipid profile. Will dc today; f/u OV next week in [place].   Lennette Bihari, MD, [facility] 01/29/2014 11:06 AM

## 2014-02-06 ENCOUNTER — Encounter: Payer: Self-pay | Admitting: Cardiovascular Disease

## 2014-02-06 ENCOUNTER — Ambulatory Visit (INDEPENDENT_AMBULATORY_CARE_PROVIDER_SITE_OTHER): Payer: BC Managed Care – PPO | Admitting: Cardiovascular Disease

## 2014-02-06 ENCOUNTER — Encounter: Payer: Self-pay | Admitting: *Deleted

## 2014-02-06 VITALS — BP 110/70 | HR 69 | Ht 66.0 in | Wt 164.8 lb

## 2014-02-06 DIAGNOSIS — I480 Paroxysmal atrial fibrillation: Secondary | ICD-10-CM

## 2014-02-06 DIAGNOSIS — E785 Hyperlipidemia, unspecified: Secondary | ICD-10-CM

## 2014-02-06 DIAGNOSIS — I10 Essential (primary) hypertension: Secondary | ICD-10-CM

## 2014-02-06 DIAGNOSIS — R0602 Shortness of breath: Secondary | ICD-10-CM

## 2014-02-06 DIAGNOSIS — I4891 Unspecified atrial fibrillation: Secondary | ICD-10-CM

## 2014-02-06 MED ORDER — ATORVASTATIN CALCIUM 40 MG PO TABS: 40.0000 mg | ORAL_TABLET | Freq: Every day | ORAL | Status: DC

## 2014-02-06 NOTE — Assessment & Plan Note (Signed)
I decreased the dose of atorvastatin 40 mg once daily considering that there was no evidence of obstructive coronary artery disease on recent cath .

## 2014-02-06 NOTE — Patient Instructions (Signed)
Your physician has requested that you have an echocardiogram. Echocardiography is a painless test that uses sound waves to create images of your heart. It provides your doctor with information about the size and shape of your heart and how well your heart's chambers and valves are working. This procedure takes approximately one hour. There are no restrictions for this procedure.  Your physician has recommended that you wear a holter monitor. Holter monitors are medical devices that record the heart's electrical activity. Doctors most often use these monitors to diagnose arrhythmias. Arrhythmias are problems with the speed or rhythm of the heartbeat. The monitor is a small, portable device. You can wear one while you do your normal daily activities. This is usually used to diagnose what is causing palpitations/syncope (passing out).   Your physician has recommended you make the following change in your medication:  Stop Aspirin  Decrease Atorvastatin to 40 mg once daily   Your physician recommends that you schedule a follow-up appointment in:  1 month

## 2014-02-06 NOTE — Assessment & Plan Note (Signed)
The patient is maintaining in sinus rhythm. However, he is complaining of exertional dyspnea and palpitations which might represent breakthrough atrial fibrillation. He is usually very symptomatic when he goes into atrial fibrillation. For that reason, I requested a 48-hour Holter monitor. I also requested an echocardiogram to evaluate for left ventricular hypertrophy and takes an antiarrhythmic medication is needed. I favor continuing anticoagulation for now. However, this can probably be switched to aspirin in the near future given that his CAHDS2 VASc score is 1.

## 2014-02-06 NOTE — Assessment & Plan Note (Signed)
Blood pressure is controlled on diltiazem. The dose was recently increased for atrial fibrillation.

## 2014-02-06 NOTE — Progress Notes (Signed)
HPI  This is a 58 year old African American male who is here today for a followup visit after recent hospitalization at [facility] for atrial fibrillation with rapid ventricular response. He reports a history of paroxysmal atrial fibrillation over the last 2 years which required multiple hospitalizations. He was treated with metoprolol in the past and then transitioned to diltiazem. He reports possibly having an ablation procedure about 20 years ago at [facility]. However, the details are not available. He has known history of hypertension over the last 2 years and tobacco use.  he initially presented with symptoms suggestive of unstable angina with EKG changes worrisome for anterior ST elevation. He underwent cardiac catheterization which showed mild nonobstructive coronary artery disease. He was in atrial fibrillation with rapid ventricular response and was treated with IV diltiazem. TEE/cardioversion was planned but the patient converted to normal sinus rhythm. He reports 4 episodes of symptomatic atrial fibrillation this year alone. He is not aware of history of sleep apnea and does not have symptoms suggestive of it. He denies alcohol use.   he continues to complain of fatigue, shortness of breath and palpitations if he overexerts himself.  No Known Allergies   Current Outpatient Prescriptions on File Prior to Visit  Medication Sig Dispense Refill  . diltiazem (CARDIZEM CD) 180 MG 24 hr capsule Take 1 capsule (180 mg total) by mouth daily.  30 capsule  11  . Multiple Vitamin (MULTIVITAMIN) tablet Take 1 tablet by mouth daily.      . rivaroxaban (XARELTO) 20 MG TABS tablet Take 1 tablet (20 mg total) by mouth daily with supper.  30 tablet  11   No current facility-administered medications on file prior to visit.     Past Medical History  Diagnosis Date  . Tobacco abuse   . Paroxysmal atrial fibrillation   . Hypertension   . Hyperlipidemia      Past Surgical History  Procedure  Laterality Date  . Cardiac catheterization    . Cardiac catheterization      [place]     Family History  Problem Relation Age of Onset  . Coronary artery disease Father 30     History   Social History  . Marital Status: Single    Spouse Name: N/A    Number of Children: N/A  . Years of Education: N/A   Occupational History  . Not on file.   Social History Main Topics  . Smoking status: Current Every Day Smoker -- 1.00 packs/day for 30 years    Types: Cigarettes  . Smokeless tobacco: Never Used  . Alcohol Use: No  . Drug Use: No  . Sexual Activity: Not on file   Other Topics Concern  . Not on file   Social History Narrative   The patient lives in [place] with his sister. He works in a substance abuse program. 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 He is a long-time smoker, one pack per day. No illicit drugs. He is not married.     ROS A 10 point review of system was performed. It is negative other than that mentioned in the history of present illness.   PHYSICAL EXAM   BP 110/70  Pulse 69  Ht  (1.676 m)  Wt 164 lb 12.8 oz (74.753 kg)  BMI 26.61 kg/m2 Constitutional: He is oriented to person, place, and time. He appears well-developed and well-nourished. No distress.  HENT: No nasal discharge.  Head: Normocephalic  and atraumatic.  Eyes: Pupils are equal and round.  No discharge. Neck: Normal range of motion. Neck supple. No JVD present. No thyromegaly present.  Cardiovascular: Normal rate, regular rhythm, normal heart sounds. Exam reveals no gallop and no friction rub. No murmur heard.  Pulmonary/Chest: Effort normal and breath sounds normal. No stridor. No respiratory distress. He has no wheezes. He has no rales. He exhibits no tenderness.  Abdominal: Soft. Bowel sounds are normal. He exhibits no distension. There is no tenderness. There is no rebound and no guarding.  Musculoskeletal: Normal range of motion. He  exhibits no edema and no tenderness.  Neurological: He is alert and oriented to person, place, and time. Coordination normal.  Skin: Skin is warm and dry. No rash noted. He is not diaphoretic. No erythema. No pallor.  Psychiatric: He has a normal mood and affect. His behavior is normal. Judgment and thought content normal.       [id number]  Rhythm  -  T-abnormality  -Possible  Anterolateral  ischemia.   ABNORMAL     ASSESSMENT AND PLAN

## 2014-02-13 ENCOUNTER — Other Ambulatory Visit: Payer: BC Managed Care – PPO

## 2014-02-13 DIAGNOSIS — R0989 Other specified symptoms and signs involving the circulatory and respiratory systems: Secondary | ICD-10-CM

## 2014-03-02 ENCOUNTER — Other Ambulatory Visit: Payer: 59

## 2014-03-02 DIAGNOSIS — R0989 Other specified symptoms and signs involving the circulatory and respiratory systems: Secondary | ICD-10-CM

## 2014-03-12 ENCOUNTER — Ambulatory Visit: Payer: BC Managed Care – PPO | Admitting: Cardiovascular Disease

## 2014-03-27 ENCOUNTER — Other Ambulatory Visit: Payer: 59

## 2014-03-28 ENCOUNTER — Observation Stay: Payer: Self-pay | Admitting: Internal Medicine

## 2014-03-28 DIAGNOSIS — I255 Ischemic cardiomyopathy: Secondary | ICD-10-CM

## 2014-03-28 DIAGNOSIS — I4891 Unspecified atrial fibrillation: Secondary | ICD-10-CM

## 2014-03-28 DIAGNOSIS — I2510 Atherosclerotic heart disease of native coronary artery without angina pectoris: Secondary | ICD-10-CM

## 2014-03-28 LAB — URINALYSIS, COMPLETE
BLOOD: NEGATIVE
Bacteria: NONE SEEN
Bilirubin,UR: NEGATIVE
GLUCOSE, UR: NEGATIVE mg/dL (ref 0–75)
Ketone: NEGATIVE
LEUKOCYTE ESTERASE: NEGATIVE
Nitrite: NEGATIVE
Ph: 6 (ref 4.5–8.0)
Protein: NEGATIVE
RBC,UR: 1 /HPF (ref 0–5)
SQUAMOUS EPITHELIAL: NONE SEEN
Specific Gravity: 1.019 (ref 1.003–1.030)

## 2014-03-28 LAB — COMPREHENSIVE METABOLIC PANEL
ALK PHOS: 97 U/L
ALT: 28 U/L
Albumin: 3.8 g/dL (ref 3.4–5.0)
Anion Gap: 9 (ref 7–16)
BUN: 18 mg/dL (ref 7–18)
Bilirubin,Total: 0.3 mg/dL (ref 0.2–1.0)
CHLORIDE: 110 mmol/L — AB (ref 98–107)
CO2: 23 mmol/L (ref 21–32)
Calcium, Total: 9.5 mg/dL (ref 8.5–10.1)
Creatinine: 1.17 mg/dL (ref 0.60–1.30)
EGFR (African American): >60
EGFR (Non-African Amer.): >60
Glucose: 110 mg/dL — ABNORMAL HIGH (ref 65–99)
OSMOLALITY: 286 (ref 275–301)
POTASSIUM: 3.9 mmol/L (ref 3.5–5.1)
SGOT(AST): 25 U/L (ref 15–37)
Sodium: 142 mmol/L (ref 136–145)
Total Protein: 7.5 g/dL (ref 6.4–8.2)

## 2014-03-28 LAB — CBC
HCT: 49 % (ref 40.0–52.0)
HGB: 16.3 g/dL (ref 13.0–18.0)
MCH: 30.1 pg (ref 26.0–34.0)
MCHC: 33.2 g/dL (ref 32.0–36.0)
MCV: 91 fL (ref 80–100)
Platelet: 294 10*3/uL (ref 150–440)
RBC: 5.41 10*6/uL (ref 4.40–5.90)
RDW: 15.1 % — AB (ref 11.5–14.5)
WBC: 8.3 10*3/uL (ref 3.8–10.6)

## 2014-03-28 LAB — CK-MB
CK-MB: 4.6 ng/mL — ABNORMAL HIGH (ref 0.5–3.6)
CK-MB: 4.4 ng/mL — ABNORMAL HIGH (ref 0.5–3.6)

## 2014-03-28 LAB — PROTIME-INR
INR: 0.9
Prothrombin Time: 12.4 secs (ref 11.5–14.7)

## 2014-03-28 LAB — HEMOGLOBIN A1C: HEMOGLOBIN A1C: 6.4 % — AB (ref 4.2–6.3)

## 2014-03-28 LAB — TROPONIN I
TROPONIN-I: 0.36 ng/mL — AB
Troponin-I: 0.2 ng/mL — ABNORMAL HIGH

## 2014-03-28 LAB — PRO B NATRIURETIC PEPTIDE: B-Type Natriuretic Peptide: 156 pg/mL — ABNORMAL HIGH (ref 0–125)

## 2014-03-28 LAB — APTT: ACTIVATED PTT: 28.7 s (ref 23.6–35.9)

## 2014-03-29 DIAGNOSIS — I517 Cardiomegaly: Secondary | ICD-10-CM

## 2014-03-29 LAB — CK-MB: CK-MB: 4.1 ng/mL — AB (ref 0.5–3.6)

## 2014-03-30 ENCOUNTER — Ambulatory Visit: Payer: BC Managed Care – PPO | Admitting: Cardiovascular Disease

## 2014-03-30 ENCOUNTER — Encounter: Payer: Self-pay | Admitting: Cardiovascular Disease

## 2014-04-06 ENCOUNTER — Encounter: Payer: Self-pay | Admitting: Physician Assistant

## 2014-04-11 ENCOUNTER — Encounter: Payer: Self-pay | Admitting: Physician Assistant

## 2014-04-11 ENCOUNTER — Encounter: Payer: Self-pay | Admitting: *Deleted

## 2014-04-11 ENCOUNTER — Encounter: Payer: 59 | Admitting: Physician Assistant

## 2014-04-11 DIAGNOSIS — I2510 Atherosclerotic heart disease of native coronary artery without angina pectoris: Secondary | ICD-10-CM | POA: Insufficient documentation

## 2014-04-11 DIAGNOSIS — Z9289 Personal history of other medical treatment: Secondary | ICD-10-CM | POA: Insufficient documentation

## 2014-04-11 DIAGNOSIS — I4891 Unspecified atrial fibrillation: Secondary | ICD-10-CM

## 2014-04-11 NOTE — Progress Notes (Signed)
Patient was a no show.   This encounter was created in error - please disregard. 

## 2014-04-28 ENCOUNTER — Emergency Department: Payer: Self-pay | Admitting: Emergency Medicine

## 2014-04-28 LAB — BASIC METABOLIC PANEL
Anion Gap: 5 — ABNORMAL LOW (ref 7–16)
BUN: 17 mg/dL (ref 7–18)
Calcium, Total: 8.7 mg/dL (ref 8.5–10.1)
Chloride: 107 mmol/L (ref 98–107)
Co2: 27 mmol/L (ref 21–32)
Creatinine: 1.11 mg/dL (ref 0.60–1.30)
EGFR (African American): >60
EGFR (Non-African Amer.): >60
Glucose: 107 mg/dL — ABNORMAL HIGH (ref 65–99)
Osmolality: 280 (ref 275–301)
POTASSIUM: 4.6 mmol/L (ref 3.5–5.1)
SODIUM: 139 mmol/L (ref 136–145)

## 2014-04-28 LAB — CBC
HCT: 44.2 % (ref 40.0–52.0)
HGB: 14.7 g/dL (ref 13.0–18.0)
MCH: 30.1 pg (ref 26.0–34.0)
MCHC: 33.3 g/dL (ref 32.0–36.0)
MCV: 91 fL (ref 80–100)
PLATELETS: 286 10*3/uL (ref 150–440)
RBC: 4.88 10*6/uL (ref 4.40–5.90)
RDW: 14.7 % — ABNORMAL HIGH (ref 11.5–14.5)
WBC: 8.6 10*3/uL (ref 3.8–10.6)

## 2014-04-28 LAB — TROPONIN I: Troponin-I: <0.02 ng/mL

## 2014-04-28 LAB — ETHANOL: Ethanol: <3 mg/dL

## 2014-05-10 ENCOUNTER — Encounter (HOSPITAL_COMMUNITY): Payer: Self-pay | Admitting: Cardiovascular Disease

## 2014-05-12 ENCOUNTER — Emergency Department: Payer: Self-pay | Admitting: Emergency Medicine

## 2014-05-12 LAB — TROPONIN I
Troponin-I: <0.02 ng/mL
Troponin-I: 0.03 ng/mL

## 2014-05-12 LAB — COMPREHENSIVE METABOLIC PANEL
AST: 21 U/L (ref 15–37)
Albumin: 3.4 g/dL (ref 3.4–5.0)
Alkaline Phosphatase: 86 U/L
Anion Gap: 8 (ref 7–16)
BILIRUBIN TOTAL: 0.3 mg/dL (ref 0.2–1.0)
BUN: 14 mg/dL (ref 7–18)
CHLORIDE: 111 mmol/L — AB (ref 98–107)
CO2: 22 mmol/L (ref 21–32)
CREATININE: 1.07 mg/dL (ref 0.60–1.30)
Calcium, Total: 8.7 mg/dL (ref 8.5–10.1)
Glucose: 113 mg/dL — ABNORMAL HIGH (ref 65–99)
Osmolality: 283 (ref 275–301)
Potassium: 3.8 mmol/L (ref 3.5–5.1)
SGPT (ALT): 24 U/L
Sodium: 141 mmol/L (ref 136–145)
TOTAL PROTEIN: 6.9 g/dL (ref 6.4–8.2)

## 2014-05-12 LAB — CBC
HCT: 47.1 % (ref 40.0–52.0)
HGB: 15.2 g/dL (ref 13.0–18.0)
MCH: 29.5 pg (ref 26.0–34.0)
MCHC: 32.3 g/dL (ref 32.0–36.0)
MCV: 91 fL (ref 80–100)
Platelet: 272 10*3/uL (ref 150–440)
RBC: 5.15 10*6/uL (ref 4.40–5.90)
RDW: 15.3 % — ABNORMAL HIGH (ref 11.5–14.5)
WBC: 8.3 10*3/uL (ref 3.8–10.6)

## 2014-05-18 ENCOUNTER — Telehealth: Payer: Self-pay | Admitting: *Deleted

## 2014-05-18 NOTE — Telephone Encounter (Signed)
LVM to inform patient that per Dr. Kirke Corin his holter showed:  NSR with no significant arrhythmia  No Afib

## 2014-05-21 ENCOUNTER — Ambulatory Visit (INDEPENDENT_AMBULATORY_CARE_PROVIDER_SITE_OTHER): Payer: 59 | Admitting: *Deleted

## 2014-05-21 ENCOUNTER — Other Ambulatory Visit: Payer: Self-pay

## 2014-05-21 DIAGNOSIS — I4891 Unspecified atrial fibrillation: Secondary | ICD-10-CM

## 2014-07-03 ENCOUNTER — Telehealth: Payer: Self-pay | Admitting: *Deleted

## 2014-07-03 NOTE — Telephone Encounter (Signed)
Metoprolol 25mg twice a day 

## 2014-07-04 ENCOUNTER — Other Ambulatory Visit: Payer: Self-pay | Admitting: *Deleted

## 2014-07-04 MED ORDER — METOPROLOL TARTRATE 25 MG PO TABS: 25.0000 mg | ORAL_TABLET | Freq: Two times a day (BID) | ORAL | Status: DC | PRN

## 2014-07-04 NOTE — Telephone Encounter (Signed)
Pt is calling stating that he is now out two days on Metoprolol, please call him he needs it to be sent today.

## 2014-07-04 NOTE — Telephone Encounter (Signed)
Pt did not have metoprolol tartrate 25 mg that was prescribed by Kirke Corin in Oct. 2015 @ [facility] due to last admission. Pt was requesting refill #30 R#3 sent to [facility].

## 2014-07-31 ENCOUNTER — Emergency Department: Payer: Self-pay | Admitting: Internal Medicine

## 2014-09-19 ENCOUNTER — Emergency Department
Admit: 2014-09-19 | Disposition: A | Discharge status: Home / Self Care | Payer: Self-pay | Admitting: Emergency Medicine

## 2014-09-19 LAB — BASIC METABOLIC PANEL
Anion Gap: 7 (ref 7–16)
BUN: 24 mg/dL — AB
Calcium, Total: 8.9 mg/dL
Chloride: 108 mmol/L
Co2: 24 mmol/L
Creatinine: 1.15 mg/dL
EGFR (Non-African Amer.): >60
Glucose: 100 mg/dL — ABNORMAL HIGH
POTASSIUM: 3.9 mmol/L
Sodium: 139 mmol/L

## 2014-09-19 LAB — CBC WITH DIFFERENTIAL/PLATELET
BASOS PCT: 0.8 %
Basophil #: 0.1 10*3/uL (ref 0.0–0.1)
Eosinophil #: 0.2 10*3/uL (ref 0.0–0.7)
Eosinophil %: 1.9 %
HCT: 41.2 % (ref 40.0–52.0)
HGB: 13.6 g/dL (ref 13.0–18.0)
Lymphocyte #: 4.3 10*3/uL — ABNORMAL HIGH (ref 1.0–3.6)
Lymphocyte %: 53.5 %
MCH: 29.6 pg (ref 26.0–34.0)
MCHC: 33 g/dL (ref 32.0–36.0)
MCV: 90 fL (ref 80–100)
MONO ABS: 0.7 x10 3/mm (ref 0.2–1.0)
Monocyte %: 8.3 %
Neutrophil #: 2.9 10*3/uL (ref 1.4–6.5)
Neutrophil %: 35.5 %
PLATELETS: 263 10*3/uL (ref 150–440)
RBC: 4.6 10*6/uL (ref 4.40–5.90)
RDW: 14.9 % — ABNORMAL HIGH (ref 11.5–14.5)
WBC: 8.1 10*3/uL (ref 3.8–10.6)

## 2014-09-19 LAB — TROPONIN I

## 2014-09-21 ENCOUNTER — Observation Stay
Admit: 2014-09-21 | Disposition: A | Discharge status: Home / Self Care | Payer: Self-pay | Attending: Internal Medicine | Admitting: Internal Medicine

## 2014-09-21 DIAGNOSIS — I517 Cardiomegaly: Secondary | ICD-10-CM | POA: Diagnosis not present

## 2014-09-21 DIAGNOSIS — I2510 Atherosclerotic heart disease of native coronary artery without angina pectoris: Secondary | ICD-10-CM | POA: Diagnosis not present

## 2014-09-21 DIAGNOSIS — I480 Paroxysmal atrial fibrillation: Secondary | ICD-10-CM | POA: Diagnosis not present

## 2014-09-21 LAB — CBC
HCT: 43.3 % (ref 40.0–52.0)
HGB: 14.3 g/dL (ref 13.0–18.0)
MCH: 29.8 pg (ref 26.0–34.0)
MCHC: 33 g/dL (ref 32.0–36.0)
MCV: 90 fL (ref 80–100)
Platelet: 286 10*3/uL (ref 150–440)
RBC: 4.79 10*6/uL (ref 4.40–5.90)
RDW: 15 % — ABNORMAL HIGH (ref 11.5–14.5)
WBC: 9 10*3/uL (ref 3.8–10.6)

## 2014-09-21 LAB — BASIC METABOLIC PANEL
Anion Gap: 7 (ref 7–16)
BUN: 15 mg/dL
Calcium, Total: 8.9 mg/dL
Chloride: 109 mmol/L
Co2: 23 mmol/L
Creatinine: 1.1 mg/dL
EGFR (African American): >60
EGFR (Non-African Amer.): >60
Glucose: 114 mg/dL — ABNORMAL HIGH
POTASSIUM: 3.5 mmol/L
Sodium: 139 mmol/L

## 2014-09-21 LAB — TSH: Thyroid Stimulating Horm: 1.738 u[IU]/mL

## 2014-09-21 LAB — CK-MB
CK-MB: 3 ng/mL
CK-MB: 3.4 ng/mL
CK-MB: 3.3 ng/mL

## 2014-09-21 LAB — TROPONIN I
Troponin-I: <0.03 ng/mL
Troponin-I: <0.03 ng/mL

## 2014-09-21 LAB — MAGNESIUM: MAGNESIUM: 1.8 mg/dL

## 2014-09-22 NOTE — H&P (Signed)
PATIENT NAME:  Darren Rogers, Darren Rogers MR#:  [id number] DATE OF BIRTH:  [birth year]  DATE OF ADMISSION:  03/28/2014  PRIMARY CARE PHYSICIAN: None.  CARDIOLOGY: Muhammad A. Kirke Corin, MD   CHIEF COMPLAINT: Low blood sugars and palpitations with chest pain.   HISTORY OF PRESENT ILLNESS: A 59 year old African American male patient with history of paroxysmal atrial fibrillation, alcohol abuse in the past, hypertension, who has been off his rate control medications, was found to have dizziness and weakness at work. He also had palpitations and chest pain. His blood sugars were checked, which were low at 20, per patient, and he was given some orange juice and peanut butter. His blood sugars improved, was sent to the Emergency Room through EMS. He had elevated heart rate into the 160s with atrial fibrillation, was given a dose of 25 metoprolol and 1 liter of normal saline and his heart rate improved into the 80s. Initially troponin was normal. Plan was to send the patient home but his repeat troponin has come back elevated at 0.20. Presently, he is chest pain free, feels back to baseline.   The patient did have some changes on the EKG which is concerning for possible ST-elevation MI. An old EKG was compared from August and May of 2015, which looked similar. The patient's case was discussed with Dr. Juliann Pares who did not feel this was ST-elevation MI. As patient, for similar EKG changes, was shipped to [facility] in [place], had a cardiac catheterization which showed no obstructions per patient. No stents placed.   The patient mentions that he stopped taking his Cardizem as the patient has been more constipated since his Cardizem dose was increased from 120 mg to 180 mg at [facility]. He saw Dr. Kirke Corin in September 2015, but mentions he is unable to see the cardiologist secondary to the co-pay.    PAST MEDICAL HISTORY: 1.  Paroxysmal atrial fibrillation.  2.  History of alcohol abuse, but has not had any alcohol in 4  years.  3.  Hypertension.  4.  Anxiety.  5.  Hyperlipidemia.  6.  Tobacco abuse.  7.  Sleep apnea.  8.  Constipation.   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 He continues to smoke a pack a day. He has not had any alcohol in 4 years, but did abuse alcohol in the past.   CODE STATUS: Full code.   FAMILY HISTORY: Hypertension.  ALLERGIES: No known drug allergies.   HOME MEDICATIONS: Aspirin 81 mg daily. The patient is not taking his Cardizem or simvastatin.   REVIEW OF SYSTEMS: CONSTITUTIONAL: Complains of some fatigue.  EYES: No blurred vision, pain or redness.  EARS, NOSE AND THROAT: No tinnitus, ear pain, hearing loss.  RESPIRATORY: No cough, wheeze, bronchitis.  CARDIOVASCULAR: Had chest pain, palpitations earlier which are resolved.  GASTROINTESTINAL: No nausea, vomiting, diarrhea, abdominal pain.  GENITOURINARY: No dysuria, hematuria, frequency.  ENDOCRINE: No polyuria, nocturia, thyroid problems.  HEMATOLOGIC AND LYMPHATIC: No anemia, easy bruising, bleeding.  INTEGUMENTARY: No acne, rash, lesion.  MUSCULOSKELETAL: No back pain, arthritis.  NEUROLOGIC: No focal numbness, weakness, seizure.  PSYCHIATRIC: No anxiety or depression .  PHYSICAL EXAMINATION:  VITAL SIGNS: Shows temperature 97.8, blood pressure 140/99, initially pulse was 160, presently it is 75 in normal sinus rhythm.  GENERAL: Obese African American male patient lying in bed, seems comfortable, conversational, cooperative with examination.  PSYCHIATRIC: He is alert and oriented x 3. Mood and affect appropriate. Judgment intact.  HEENT: Atraumatic, normocephalic. Oral mucosa moist  and pink. External ears and nose normal. No pallor. No icterus. Pupils bilaterally equal and reactive to light.  NECK: Supple. No thyromegaly. No palpable lymph nodes. Trachea midline. No carotid bruit or JVD. CARDIOVASCULAR: S1, S2, without any murmurs. Peripheral pulses 2+. No edema.  RESPIRATORY: Normal  work of breathing. Clear to auscultation  GASTROINTESTINAL: Soft, abdomen nontender. Bowel sounds present. No organomegaly palpable.  SKIN: Warm and dry. No petechiae, rash, ulcers.  MUSCULOSKELETAL: No joint swelling, redness, effusion or enlarged joints. Normal muscle tone.  NEUROLOGICAL: Motor strength 5/5 in upper and lower extremities. Sensation was intact all over. LYMPHATICS: No cervical lymphadenopathy.   LABORATORY STUDIES: Show glucose of 137. BNP of 156, BUN 18, creatinine 1.17, sodium 142, potassium 3.9, chloride 110. HDL, ALT, alkaline phosphatase, bilirubin normal. Troponin less than 0.02 and 0.20.   WBC 8.3, hemoglobin 16.3, platelets of 294,000. INR and PTT normal. Urinalysis shows no bacteria, no wbc's.   EKG shows atrial fibrillation with some ST elevation in V2-3 leads with inversions in lateral leads.   Chest x-ray, portable, showed stable cardiomegaly, nothing acute.   ASSESSMENT AND PLAN:  1.  Atrial fibrillation with rapid ventricular rate secondary to the patient stopping his medications. He has not had any of his Cardizem and metoprolol in 2 weeks, which he stopped secondary to constipation. I have discussed with him regarding restarting medications. He is not keen on starting his Cardizem so I will start him on metoprolol 50 two times a day at this point as he responded to the Toprol given in the Emergency Room. The patient does have elevated troponin from less than 0.02 to 0.20, presently chest pain free. Once heart rate is improved, the elevation is secondary to the atrial fibrillation with RVR, but we will admit under observation on the telemetry floor for further enzymes. We will also have the patient evaluated by cardiology, Dr. Kirke Corin. Start aspirin, no need of any therapeutic Lovenox, heparin. The patient had a recent cardiac catheterization in August for some EKG changes which are stable at this point. He did not have any obstruction. No percutaneous coronary  intervention needed.  2.  Constipation. Put him on Colace daily along with MiraLax daily as needed. Increase fiber in diet. Discussed with the patient.  3.  Tobacco abuse. Counseled the patient to quit smoking for greater than 3 minutes.  4.  Hypoglycemia. 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 He is not diabetic, not on diabetes medications or insulin. Has been eating well. At this point, we will put him on Accu-Cheks without any insulin coverage. The patient will need further workup if he does not have any low blood sugars. Presently, his blood sugars are at 156.  5.  Deep venous thrombosis prophylaxis with Lovenox.   CODE STATUS: Full code.   TIME SPENT: Today on this case was 40 minutes.   ____________________________ Molinda Bailiff Jasaiah Karwowski, MD srs:TT D: 03/28/2014 16:19:58 ET T: 03/28/2014 16:58:02 ET JOB#: [id number]  cc: Wardell Heath R. Sorina Derrig, MD, <Dictator> Muhammad A. Kirke Corin, MD Orie Fisherman MD ELECTRONICALLY SIGNED 04/05/2014 14:56

## 2014-09-22 NOTE — Consult Note (Signed)
PATIENT NAME:  Darren Rogers, Darren Rogers MR#:  [id number] DATE OF BIRTH:  1955/06/16  DATE OF CONSULTATION:  05/12/2014  REFERRING PHYSICIAN:  Coolidge Breeze, MD in the Emergency Room.   FAMILY PHYSICIAN:  Muhammad A. Kirke Corin, MD.  REASON FOR CONSULTATION:   Rapid atrial fibrillation.   HISTORY OF PRESENT ILLNESS: The patient is a 59 year old male with a history of chronic atrial fibrillation and medical noncompliance. Has not been taking his Xarelto or diltiazem because of constipation. Presents to the Emergency Room with minimal chest pain and tachypalpitations. In the Emergency Room, the patient was noted to be in rapid atrial fibrillation. He was given IV and oral diltiazem with improvement of his rate. He is currently not having chest pain. Consultation was subsequently requested. Of note, the patient had cardiac catheterization done at [facility] 3 months ago which was negative for any significant coronary artery disease.   PAST MEDICAL HISTORY: 1.  Chronic atrial fibrillation.  2.  History of alcohol abuse.  3.  Cardiomyopathy.  4.  Benign hypertension.  5.  Hyperlipidemia.  6.  Anxiety/depression.  7.  History of sleep apnea.   MEDICATIONS: None.   ALLERGIES: No known drug allergies.   SOCIAL HISTORY: The patient does smoke. Does drink alcohol. Denies illicit drug use.   FAMILY HISTORY: Positive for coronary artery disease and diabetes.   REVIEW OF SYSTEMS:  CONSTITUTIONAL: No fever or change in weight.  EYES: No blurred or double vision. No glaucoma.   ENT: No tinnitus or hearing loss. No nasal discharge or bleeding. No difficulty swallowing.  RESPIRATORY: No cough or wheezing. Denies hemoptysis.  CARDIOVASCULAR: No orthopnea or syncope. Currently without chest pain.  GASTROINTESTINAL: No nausea, vomiting, or diarrhea. No abdominal pain. No change in bowel habits.  GENITOURINARY: No dysuria or hematuria. No incontinence.  ENDOCRINE: No polyuria or polydipsia. No heat or cold  intolerance.  HEMATOLOGIC: The patient denies anemia, easy bruising or bleeding.  LYMPHATIC: No swollen glands.  MUSCULOSKELETAL: The patient has pain in his neck, back, shoulders, knees, and hips. No gout.  NEUROLOGIC: No numbness or migraines. Denies stroke or seizures.  PSYCHIATRIC: The patient denies anxiety, insomnia or depression.   PHYSICAL EXAMINATION: GENERAL: The patient is in no acute distress.  VITAL SIGNS: Currently remarkable for a blood pressure of 117/78 with a heart rate of 97, respiratory rate of 16, temperature of 97.7, sat 96% on room air.  HEENT: Normocephalic, atraumatic. Pupils are equally round and reactive to light and accommodation. Extraocular movements are intact. Sclerae are not icteric. Conjunctivae are clear.   Oropharynx is clear.  NECK: Supple without JVD. No adenopathy or thyromegaly is noted.  LUNGS: Clear to auscultation and percussion without wheezes, rales or rhonchi. No dullness. Respiratory effort is normal.  CARDIAC: Irregularly irregular rhythm. No significant rubs or gallops. PMI is nondisplaced. Chest wall is nontender.  ABDOMEN: Soft, nontender, with normoactive bowel sounds. No organomegaly or masses were appreciated. No hernias or bruits were noted.  EXTREMITIES: Without clubbing, cyanosis or edema. Pulses were 2+ bilaterally.  SKIN: Warm and dry without rash or lesions.  NEUROLOGIC: Revealed cranial nerves II through XII grossly intact. Deep tendon reflexes were symmetric. Motor and sensory examination is nonfocal.  PSYCHIATRIC: Revealed a patient who is alert and oriented to person, place, and time. He was cooperative and used good judgment.   LABORATORY DATA: EKG revealed atrial fibrillation with no acute ischemic changes. Chest x-ray was unremarkable. His troponin was less than 0.02. Potassium 3.8. CBC was within  normal limits.   ASSESSMENT: 1.  Rapid atrial fibrillation, resolved.  2.  Medical noncompliance.  3.  Benign hypertension.  4.   Hyperlipidemia.   PLAN: The patient was reassured. He is to begin full-strength aspirin 325 mg p.o. daily with Lopressor 25 mg p.o. b.i.d. He will follow up with Dr. Kirke Corin,  his cardiologist, later this week. He is to return to the Emergency Room if his symptoms should recur or worsen.   Thank you for the consultation.   Total time spent on this patient was 45 minutes.    ____________________________ Duane Lope Judithann Sheen, MD jds:DT D: 05/12/2014 12:32:00 ET T: 05/12/2014 12:59:00 ET JOB#: [id number]  cc: Duane Lope. Judithann Sheen, MD, <Dictator> JEFFREY Rodena Medin MD ELECTRONICALLY SIGNED 05/13/2014 11:13

## 2014-09-22 NOTE — Consult Note (Signed)
General Aspect Primary Cardiologist: Dr. Fletcher Anon, MD ________________  59 year old male with history of nonobstructive CAD, PAF, HTN, HLD, & ongoing tobacco abuse who presents to [facility] ED today with dizziness, visual disturbance, palpitations and chest pain. While in the ED he was found to be in a-fib with RVR rate of 160 and an elevated TnI of <0.02--0.20. He has history of being quite symptomatic when in a-fib with RVR. Cardiology was consulted for further evaluation.  ________________  PMH:  1. Nonobstructive CAD - cath 01/27/2014 at [facility] 2. PAF 3. Ongoing tobacco abuse 4. HTN 5. HLD ________________   Present Illness 60 year old male with the above problem list who presented to [facility] ED today with dizziness, visual disturbance, palpitations, and chest pain. He was found to be in a-fib with RVR, rates at 160. TnI found to be <0.02--0.20.   Patient with history of longstanding PAF. He was initially treated with metoprolol then transitioned to diltiazem. Has undergone an ablation procedure a [facility] 20 years ago. He has been hospitalized multiple times over the past 2 years 2/2 a-fib with RVR. He recently had a hospitalization at [facility] in late August for symptoms concerning of unstable angina. EKG changes were concerning for anterior ST elevation. He underwent cardiac catheterization the showed mild nonobstructive disease. He did have vigorous LV systolic function without regional WMA. No LVH. He was in a-fib with RVR and he was treated accordingly with IV diltiazem. TEE/cardioversion was planned but the patient converted to normal sinus rhythm. He reports 4 episodes of symptomatic atrial fibrillation this year alone. He is not aware of history of sleep apnea and does not have symptoms suggestive of it. He denies alcohol use. He was last seen in our outpatient office on 02/06/2014 and advised to have an echo to evaluate for left ventricular hypertrophy. He was also advised to wear a Holter monitor to assess  how requently he is in a-fib as he is quite symptomatic when he goes into a-fib with RVR. He did not follow through with these plans.   Today he comes into [facility] with complaints of dizziness, visual disturbances (seeing blue spots today), palpitations, and chest pain while at work this morning. Patient was in his usual state of health upon waking this morning and had a breakfast of eggs and sausage. Upon arriving at work around 8-9 AM he began to develop the above symptoms. He checked in with the nurse who checked his blood sugar. It was found to be 20. He was given candy, OJ, and a glucose tab. He was taken to [facility]. It the process of all of that he began to develop palpitations and chest pain. His chest pain was located substernally and did not radiate. There was no associated diaphoresis, SOB, nausea, vomiting, presyncope, syncope, or edema.   Of note he complains of polydipsia, polyphagia, and polyuria over the past couple of months. While he was admitted at [facility] he was advised to keep an eye on his blood sugar and follow up with his PCP - he was yet to do so.   He also reports significant constipation with the increase of diltiazem from 120 mg to 180 mg. Because of this he stopped all of his medications sometime between his office visit with Dr. Fletcher Anon and now.   In the ED his TnI was found to be <0.02--0.20. Blood sugar was 110 (POCT glucose 137).   Patient is in NSR on telemetry with HR in the 70s. He is currently  chest pain free.   Physical Exam:  GEN well developed, well nourished, no acute distress   HEENT PERRL, hearing intact to voice, moist oral mucosa   NECK supple   RESP normal resp effort  bilateral wheezing   CARD Regular rate and rhythm  Normal, S1, S2  No murmur   ABD denies tenderness  soft  normal BS   EXTR negative edema   SKIN normal to palpation   NEURO cranial nerves intact   PSYCH alert, A+O to time, place, person, good insight   Review of Systems:  General:  Fatigue  Weakness   Skin: No Complaints   ENT: No Complaints   Eyes: No Complaints   Neck: No Complaints   Respiratory: No Complaints   Cardiovascular: Chest pain or discomfort  Tightness   Gastrointestinal: No Complaints   Genitourinary: Frequent urination   Vascular: No Complaints   Musculoskeletal: No Complaints   Neurologic: No Complaints   Hematologic: No Complaints   Endocrine: Excessive thirst or urination   Psychiatric: No Complaints   Review of Systems: All other systems were reviewed and found to be negative   Medications/Allergies Reviewed Medications/Allergies reviewed   Family & Social History:  Family and Social History:  Family History father cad   Social History negative ETOH, negative Illicit drugs   + Tobacco Current (within 1 year)     Atrial Fibrillation:    Anxiety:    Sleep apnea:    Depression:    Hypercholesterolemia:    HTN:    enlarged heart:    cardiac cath:    Alcohol Abuse:    card cath:   Home Medications: Medication Instructions Status  Diltiazem Hydrochloride CD 180 mg/24 hours oral capsule, extended release 1 cap(s) orally once a day Active  atorvastatin 80 mg oral tablet 1 tab(s) orally once a day (at bedtime) Active   Lab Results:  Hepatic:  28-Oct-15 11:28   Bilirubin, Total 0.3  Alkaline Phosphatase 97 (46-116 NOTE: New Reference Range 12/19/13)  SGPT (ALT) 28 (14-63 NOTE: New Reference Range 12/19/13)  SGOT (AST) 25  Total Protein, Serum 7.5  Albumin, Serum 3.8  Routine Chem:  28-Oct-15 11:28   Result Comment potassium/bun/ast - Slight hemolysis, interpret results with  - caution.  Result(s) reported on 28 Mar 2014 at 12:17PM.  B-Type Natriuretic Peptide [facility])  156 (Result(s) reported on 28 Mar 2014 at 12:35PM.)  Glucose, Serum  110  BUN 18  Creatinine (comp) 1.17  Sodium, Serum 142  Potassium, Serum 3.9  Chloride, Serum  110  CO2, Serum 23  Calcium (Total), Serum 9.5  Osmolality  (calc) 286  eGFR (African American) >60  eGFR (Non-African American) >60 (eGFR values <39m/min/1.73 m2 may be an indication of chronic kidney disease (CKD). Calculated eGFR, using the MRDR Study equation, is useful in  patients with stable renal function. The eGFR calculation will not be reliable in acutely ill patients when serum creatinine is changing rapidly. It is not useful in patients on dialysis. The eGFR calculation may not be applicable to patients at the low and high extremes of body sizes, pregnant women, and vetetarians.)  Anion Gap 9  Cardiac:  28-Oct-15 11:28   Troponin I < 0.02 (0.00-0.05 0.05 ng/mL or less: NEGATIVE  Repeat testing in 3-6 hrs  if clinically indicated. >0.05 ng/mL: POTENTIAL  MYOCARDIAL INJURY. Repeat  testing in 3-6 hrs if  clinically indicated. NOTE: An increase or decrease  of 30% or more on serial  testing suggests a  clinically important change)  Routine Coag:  28-Oct-15 11:28   Activated PTT (APTT) 28.7 (A HCT value >55% may artifactually increase the APTT. In one study, the increase was an average of 19%. Reference: "Effect on Routine and Special Coagulation Testing Values of Citrate Anticoagulant Adjustment in Patients with High HCT Values." American Journal of Clinical Pathology 2006;126:400-405.)  Prothrombin 12.4  INR 0.9 (INR reference interval applies to patients on anticoagulant therapy. A single INR therapeutic range for coumarins is not optimal for all indications; however, the suggested range for most indications is 2.0 - 3.0. Exceptions to the INR Reference Range may include: Prosthetic heart valves, acute myocardial infarction, prevention of myocardial infarction, and combinations of aspirin and anticoagulant. The need for a higher or lower target INR must be assessed individually. Reference: The Pharmacology and Management of the Vitamin K  antagonists: the seventh ACCP Conference on Antithrombotic and Thrombolytic  Therapy. ZESPQ.3300 Sept:126 (3suppl): [id number]. A HCT value >55% may artifactually increase the PT.  In one study,  the increase was an average of 25%. Reference:  "Effect on Routine and Special Coagulation Testing Values of Citrate Anticoagulant Adjustment in Patients with High HCT Values." American Journal of Clinical Pathology 2006;126:400-405.)  Routine Hem:  28-Oct-15 11:28   WBC (CBC) 8.3  RBC (CBC) 5.41  Hemoglobin (CBC) 16.3  Hematocrit (CBC) 49.0  Platelet Count (CBC) 294 (Result(s) reported on 28 Mar 2014 at 12:06PM.)  MCV 91  MCH 30.1  MCHC 33.2  RDW  15.1   Radiology Results: XRay:    28-Oct-15 12:21, Chest Portable Single View  Chest Portable Single View   REASON FOR EXAM:    Chest pain  COMMENTS:       PROCEDURE: DXR - DXR PORTABLE CHEST SINGLE VIEW  - Mar 28 2014 12:21PM     CLINICAL DATA:  Chest pain.  Atrial fibrillation.    EXAM:  PORTABLE CHEST - 1 VIEW    COMPARISON:  01/27/2014.    FINDINGS:  Mediastinum and hilar structures are normal. Stable cardiomegaly. No  evidence of over pulmonary edema. No pleural effusion or  pneumothorax. No acute bony abnormality. Degenerative changes both  shoulders and thoracic spine.     IMPRESSION:  1. Stablecardiomegaly. No evidence of overt congestive heart  failure.  2. No acute pulmonary disease.      Electronically Signed    By: Marcello Moores  Register    On: 03/28/2014 12:45         Verified By: Osa Craver, M.D., MD    No Known Allergies:    Impression 59 year old male with history of nonobstructive CAD, PAF, HTN, HLD, & ongoing tobacco abuse who presents to [facility] ED today with chest pain. While in the ED he was found to be in a-fib with RVR rate of 160 and an elevated TnI of <0.02--0.20. He has history of being quite symptomatic when in a-fib with RVR. Cardiology was consulted for further evaluation.   1. A-fib with RVR: -Currently in NSR with a rate in the 70s - self converted -Check an echo in  the morning to evaluate LV function and valve status - nl see below -Given his recent cardiac cath in August 2015 that did not show coronary disease recommend restarting low dose diltiazem CD 120 mg daily & flecainide 50 mg prn a-fib (it was discussed with him the risks of this antiarrhythmic, as well as the fact that he must remain on diltiazem while taking this medication)  -CHADSVASc: likely 2 at  this time (HTN and potentially DM) recommend continuing anticoagulation with Xarelto 20 mg qhs at this time -Should his a-fib remain symptomatic as an outpatient we can refer him to the a-fib clinic in [place]  2. Demand ischemia: -Likely 2/2 the above tachycardic heart rate seen with his RVR -He most likely has some small vessel disease wcich accounts for his chest pain -Would discontinue the aspirin at this time as he will be on Xarelto (no need for dual therapy currently)  3. Nonobstructive CAD: -Patient with cardiac cath 01/27/2014 at [facility] that demonstrated mild nonobstructive disease  4. Reported hypoglycemia in the field: -Patient reports a glucose of 20 this AM along with a history of glucose intolerance, polydipsia, polyphagia, and polyuria. He was likely experiencing elevated glucose levels, his body was over compensating with increased insulin secretion this morning when he did not eat a breakfast high in sugar, thus leading to a hypoglycemic event -Check an A1C -Routine glucometer checks  5. Constipation: -Diltiazem can lead to some constipation, it is unfortunate that he stopped his medication -Moving bowels currently, if problem persists can re visit/ obtain outpatient GI referral  -Miralax   Electronic Signatures for Addendum Section:  Leonie Man (MD) (Signed Addendum 29-Oct-15 00:28)  I have seen and examined the patient along with Mr. Idolina Primer  PA--C.   I have reviewed all of the available data  in the chart & on Epic.   I agree with hhis findings on examination, impression and  recommendations as we have discussed.  --  only notable difference is the dose of flecainide is higher.  200 mg  instead of 50 mg for PRN dosing.   recent cath nonobbstructive CAD would make positive troponin in  the ssetting of dementia ischemia related  event.   I would not evaluate further beyond the planned echocardiogram.  He spontaneously converted in normmal sinus rhythm , but is very symptomatic in A. fib.   He also ddid not tolerate thhe increased dose of calcium chaannnel very well --  complainiing of constipation and erectile dysfunction. -Check an echo in the morning to evaluate LV function and valve status - nl see below -Restart low dose diltiazem CD 120 mg daily & flecainide 200 mg prn a-fib (it was discussed with him the risks of this antiarrhythmic, as well as the fact that he must remain on diltiazem while taking this medication)  -CHADSVASc: likely 2 at this time (HTN and potentially DM) recommend continuing anticoagulation with Xarelto 20 mg qhs at this time -Should his a-fib remain symptomatic as an outpatient we can refer him to the a-fib clinic in [place]: Rise Mu (PA-C)  (Signed 28-Oct-15 18:22)  Authored: General Aspect/Present Illness, History and Physical Exam, Review of System, Family & Social History, Past Medical History, Home Medications, Labs, Radiology, Allergies, Impression/Plan Leonie Man (MD)  (Signed 29-Oct-15 00:28)  Co-Signer: General Aspect/Present Illness, Family & Social History, Past Medical History, Home Medications, Labs, Radiology, Allergies, Impression/Plan   Last Updated: 29-Oct-15 00:28 by Leonie Man (MD)

## 2014-09-22 NOTE — Discharge Summary (Signed)
PATIENT NAME:  Darren Rogers, Darren Rogers MR#:  [id number] DATE OF BIRTH:  1956-03-15  DATE OF ADMISSION:  03/28/2014 DATE OF DISCHARGE:  03/29/2014  ADMITTING PHYSICIAN: Srikar R. Sudini, MD   DISCHARGING PHYSICIAN: Enid Baas, MD   PRIMARY MD: None.   CONSULTATIONS IN THE HOSPITAL: Cardiology consultation by Antonieta Iba, MD.    DISCHARGE DIAGNOSES:  1.  Atrial fibrillation with rapid ventricular response.  2.  Paroxysmal atrial fibrillation.  3.  Hypertension.  4.  Hyperlipidemia.  5.  Tobacco use disorder.  6.  One episode of hypoglycemia of unknown significance.   DISCHARGE HOME MEDICATIONS:  1.  Atorvastatin 80 mg p.o. daily.  2.  Cardizem 120 mg p.o. daily.  3.  Colace 100 mg p.o. b.i.d.  4.  Aspirin 325 mg p.o. daily.  5.  Metoprolol 25 mg p.o. daily p.r.n. for palpitations, tachycardia.   DISCHARGE DIET: Low-sodium diet.   DISCHARGE ACTIVITY: As tolerated.    FOLLOWUP INSTRUCTIONS: Cardiology followup in 2 weeks.   LABORATORIES AND IMAGING STUDIES PRIOR TO DISCHARGE: WBC 8.3, hemoglobin 16.3, hematocrit 49.0, platelet count 294.   Sodium 142, potassium 3.9, chloride 110, bicarb 23, BUN 18, creatinine 1.17, glucose 110 and calcium of 9.5.   ALT 28, AST 25, alkaline phosphatase 97, total bilirubin 0.3 and albumin of 3.8. BNP is elevated at 156. Chest x-ray showing stable cardiomegaly. No evidence of congestive heart failure. Urinalysis negative for any infection. Troponin slightly elevated at 0.36. Echo Doppler showing normal LV ejection fraction, EF of 55 to 60%, mild LVH noted.   BRIEF HOSPITAL COURSE: Darren Rogers is a 59 year old African American male with past medical history significant for history of paroxysmal atrial fibrillation, hyperlipidemia, alcohol abuse and smoking, presented to the hospital secondary to atrial fibrillation and RVR.  1.  Atrial fibrillation and RVR. The patient is symptomatic whenever his rhythm changes. He was supposed to be on Cardizem and  metoprolol, which he stopped secondary to constipation. He immediately converted to normal sinus rhythm with medication and was seen by cardiologist. Echo was normal. Recommended to stay on Cardizem 120 mg p.o. daily. He also had a cardiac catheterization done recently at [facility], which showed normal coronaries. His CHADS score was 2 and he was advised to be on anticoagulation but the patient refused anticoagulation and just wanted to take an aspirin. The risks of strokes explained and he acknowledged but still wanted to be just on aspirin. He is being discharged on p.r.n. metoprolol whenever he feels the palpitations and rhythm change.  Otherwise he can take a daily Cardizem and aspirin for that.  2.  Hyperlipidemia.  He is on statin.  3.  Constipation. Take Colace b.i.d. every day and milk of magnesia p.r.n. for constipation.   His course has been otherwise uneventful in the hospital.   DISCHARGE CONDITION: Stable.   DISCHARGE DISPOSITION: Home.   TIME SPENT ON DISCHARGE: 45 minutes.    ____________________________ Enid Baas, MD rk:AT D: 03/29/2014 14:11:00 ET T: 03/29/2014 23:22:16 ET JOB#: [id number]  cc: Antonieta Iba, MD Enid Baas, MD, <Dictator>  Enid Baas MD ELECTRONICALLY SIGNED 04/03/2014 10:55

## 2014-09-23 NOTE — Discharge Summary (Signed)
PATIENT NAME:  Darren Rogers, Darren Rogers MR#:  [id number] DATE OF BIRTH:  15-Oct-1955  DATE OF ADMISSION:  12/05/2011 DATE OF DISCHARGE:  12/06/2011  PRESENTING COMPLAINT: Palpitations.  DISCHARGE DIAGNOSES:  1. Rapid atrial fibrillation, resolved, now in sinus rhythm.  2. Hypertension.  3. Chronic alcoholism undergoing treatment at [facility]. The patient has been sober for about 90 days.  4. Hyperlipidemia. The patient wants to try dietary restrictions.  LABORATORY, DIAGNOSTIC AND RADIOLOGIC DATA: Echo Doppler showed ejection fraction of 55%. Right ventricular systolic function is normal. There is moderate to severe tricuspid regurgitation. Left atrial size is normal. Right atrial size is normal.   Magnesium 1.8. Cholesterol 234 and LDL 124. Cardiac enzymes x3 were negative. No acute disease of the chest.   CBC within normal limits.   DISCHARGE FOLLOWUP/INSTRUCTIONS: Followup with the [facility] on your 12/10/2011 appointment.   DISCHARGE MEDICATIONS: 1. Aspirin 81 mg daily.  2. Vistaril 25 mg 1 tablet every 6 hours as needed for anxiety.  3. Visine eyedrops as needed.  4. Cardizem CD 120 mg p.o. daily.  DIET: Low sodium diet, low fat, low cholesterol diet.  BRIEF SUMMARY OF HOSPITAL COURSE: Mr. Millender is a 59 year old African American gentleman who comes in from [facility] where he is getting treatment for chronic alcoholism who comes in with palpitations and was admitted with:  1. Rapid atrial fibrillation. The patient was found to have heart rate in the 130s, received some IV Cardizem and p.o. Cardizem. His heart rate was stabilized and prior to discharge was down in the 60s. He is back in sinus rhythm. He will be continued on a baby aspirin. He was deemed not a good candidate for anticoagulation in the past due to his chronic alcoholism. The patient denies any chest pain.  2. Relative hypotension, resolved after IV fluids. Blood pressure stable.  3. Hyperlipidemia. The patient was counseled regarding  low fat, low cholesterol diet and exercise. The patient wants to try diet prior to starting statins.  4. History of chronic alcoholism. He is still at [facility]. He will return upon discharge and complete his treatment. The patient has been sober for about 90 days. He has his follow-up appointment with the [facility] on 12/10/2011, for which he is recommended to followup.   TIME SPENT: 40 minutes.  ____________________________ Wylie Hail Allena Katz, MD sap:slb D: 12/06/2011 11:51:30 ET T: 12/07/2011 14:50:10 ET JOB#: [id number]  cc: Nikiyah Fackler A. Allena Katz, MD, <Dictator> [facility] Willow Ora MD ELECTRONICALLY SIGNED 12/17/2011 13:46

## 2014-09-23 NOTE — H&P (Signed)
PATIENT NAME:  Darren Rogers, HOMEWOOD MR#:  [id number] DATE OF BIRTH:  1956-05-27  DATE OF ADMISSION:  12/05/2011  PRIMARY CARE PHYSICIAN:  None.  PRESENTING COMPLAINT: Palpitations, heart racing.   HISTORY OF PRESENT ILLNESS: Darren Rogers is a 59 year old African American gentleman well known to our service from previous admissions. Last admission was in September 2012 when he came in at that time with rapid atrial fibrillation. The patient has a history of paroxysmal atrial fibrillation. He was at that time discharged on metoprolol twice a day. However, the patient did not pick up his prescription and has not been on metoprolol. He has a longstanding history of chronic alcoholism. He currently has been sober more than 90 days and is undergoing treatment at residential treatment services. The patient said he is not going to be drinking alcohol anymore. He started noticing some palpitations last night that continued into this morning. He came to the ER and was found in rapid atrial fibrillation with heart rate in the 130s. He received IV diltiazem and Cardizem CD 120 mg. His heart rate during my evaluation was 85 to 95. He denies any complaints. His blood pressure was in the upper 90s; hence, he is being admitted for further evaluation and management.  The patient denies any chest pain or shortness of breath.   PAST MEDICAL HISTORY:  1. History of paroxysmal atrial fibrillation.  2. History of chronic alcoholism, now sober for more than 90 days at [facility].  3. Hypertension with relative hypotension at present.  4. Depression/anxiety.  5. Hypercholesterolemia.  6. Tobacco abuse.  7. History of sleep apnea.   SOCIAL HISTORY:  He smokes every day, less than a pack. He denies any drinking. He is at [facility] for his chronic alcoholism.   FAMILY HISTORY: Positive for hypertension.   CURRENT MEDICATIONS:  1. Vistaril 25 mg every six hours.  2. Visine 0.05 ophthalmic solution, two drops each eye once a day.   3. Clonidine 0.1 mg b.i.d.  4. Aspirin 81 mg daily.  ALLERGIES:  No known drug allergies.   REVIEW OF SYSTEMS:  CONSTITUTIONAL: No fever, fatigue, or weakness. EYES: No blurred or double vision. ENT: No tinnitus, ear pain, or hearing loss. RESPIRATORY: No cough, wheezing, or hemoptysis. CARDIOVASCULAR: Positive for palpitations and arrhythmia. GI: No nausea, vomiting, diarrhea, or abdominal pain. GU: No dysuria or hematuria.  ENDOCRINE: No polyuria or nocturia. HEMATOLOGY: No anemia or easy bruising. SKIN: No acne or rash. PSYCH: No anxiety or depression. All other systems reviewed and negative.   LABORATORY, DIAGNOSTIC, AND RADIOLOGICAL DATA: EKG shows rapid atrial fibrillation. Chest x-ray: No acute cardiopulmonary abnormality. B-type natriuretic peptide 157. Cardiac enzymes negative. CBC within normal limits. Comprehensive metabolic panel within normal limits except glucose of 107, BUN 23, creatinine 1.42. PT-INR within normal limits.   ASSESSMENT AND PLAN: 59 year old Darren Rogers with:  1. Rapid atrial fibrillation. The patient has history of paroxysmal atrial fibrillation, noncompliant with metoprolol.  He never got his prescription filled from September 2012. The patient currently has received Cardizem CD 120 mg.  His heart rate is 85 to 95. His blood pressure is in the upper 90s. He received one dose of IV diltiazem as well. We will admit the patient to the telemetry floor. We will give IV fluids, about two liters. He will be continued on Cardizem CD for now. Monitor his blood pressure. He was deemed not a candidate for anticoagulation in the past due to his chronic alcoholism. However, we will curbside cardiology to see  if the patient can be on anticoagulation. For now, will continue heparin for deep vein thrombosis prophylaxis and aspirin.  2. Relative hypotension in the setting of atrial fibrillation. We will give some IV fluids. Blood pressure is much better now.  3. Ex- alcoholism. The  patient is from [facility] and will return there upon discharge.   The above was discussed with the patient and the patient's brother who was present in the ER. Further work-up according to the patient's clinical course.       TIME SPENT: 50 minutes.  ____________________________ Wylie Hail Allena Katz, MD sap:bjt D: 12/05/2011 13:19:36 ET T: 12/05/2011 13:59:42 ET JOB#: [id number]  cc: Brenden Rudman A. Allena Katz, MD, <Dictator> Willow Ora MD ELECTRONICALLY SIGNED 12/17/2011 13:46

## 2014-09-28 ENCOUNTER — Telehealth: Payer: Self-pay | Admitting: Cardiovascular Disease

## 2014-09-28 NOTE — Telephone Encounter (Signed)
Called patient to see if he can come in today at 230 for appt.  Patient is hf and on wailist.  Left vm for patient to call back.

## 2014-09-30 NOTE — Discharge Summary (Signed)
PATIENT NAME:  Darren Rogers, Darren Rogers MR#:  [id number] DATE OF BIRTH:  [birth year]  DATE OF ADMISSION:  09/21/2014 DATE OF DISCHARGE:  09/21/2014  ADMISSION DIAGNOSES: Atrial fibrillation.  DISCHARGE DIAGNOSES: 1. Atrial fibrillation with rapid ventricular response, better control.  2. Essential hypertension.  CONSULTATION: Muhammad A. Kirke Corin, MD   LABORATORIES: Troponins x 3 were negative.   HOSPITAL COURSE: This is a 59 year old male status post ablation over 20 years ago for atrial fibrillation, presented with palpitations, found to have atrial fibrillation with RVR. For details, please refer to H and P. 1. Atrial fibrillation rapid ventricular response, paroxysmal. The patient is now converted to normal sinus rhythm. Heart rates are better controlled. He has been not compliant with his outpatient medications. Dr. Kirke Corin saw him in consultation and recommended diltiazem 180 mg daily. He has erectile dysfunction with metoprolol; therefore, not to take metoprolol. His CHADS score is 1. He does have prediabetes and will need outpatient monitoring for this. He will be on aspirin and diltiazem. Will follow up with Dr. Kirke Corin in 2 weeks.  2. Prediabetes. The patient is being referred to diabetes education as an outpatient and will also need to be followed up with his primary care physician.  3. Essential hypertension. The patient will continue metoprolol and have close follow-up. 4. Hyperlipidemia. Continue atorvastatin. 5. Tobacco (Dictation Anomaly). The patient was encouraged to stop smoking. He was counseled for 3 minutes. He will be discharged with nicotine patch.  DISCHARGE MEDICATIONS:  1. Aspirin 325 daily.  2. Diltiazem 160 mg daily. 3. Docusate 100 mg b.i.d.  4. Promethazine 5 mL q. 6 hours p.r.n.  5. Lidocaine topical 5 mL with 5 mL of Phenergan for swish and swallow. 6. Nicotine patch 21 mg for 24 hours  7. Cardizem 180 mg daily.  DISCHARGE DIET: Low-sodium.  DISCHARGE ACTIVITY: As  tolerated.  DISCHARGE FOLLOWUP: With Dr. Kirke Corin in 2 weeks.  TIME SPENT: Approximately 40 minutes.   The patient was stable for discharge.     ____________________________ Yetta Flock, MD ahm:mw D: 09/21/2014 14:10:00 ET T: 09/21/2014 16:54:08 ET JOB#: [id number]  cc: Yetta Flock, MD, <Dictator>

## 2014-09-30 NOTE — Discharge Summary (Signed)
PATIENT NAME:  Darren Rogers, Darren Rogers MR#:  [id number] DATE OF BIRTH:  1955/07/24  DATE OF ADMISSION:  09/21/2014 DATE OF DISCHARGE:  09/21/2014  ADMISSION DIAGNOSES: Atrial fibrillation.09/21/2014  DISCHARGE DIAGNOSES: 1. Atrial fibrillation with rapid ventricular response, better control.  2. Essential hypertension.  CONSULTATION: Muhammad A. Kirke Corin, MD   LABORATORIES: Troponins x 3 were negative.   HOSPITAL COURSE: This is a 59 year old male status post ablation over 20 years ago for atrial fibrillation, presented with palpitations, found to have atrial fibrillation with RVR. For details, please refer to H and P. 1. Atrial fibrillation rapid ventricular response, paroxysmal. The patient is now converted to normal sinus rhythm. Heart rates are better controlled. He has been not compliant with his outpatient medications. Dr. Kirke Corin saw him in consultation and recommended diltiazem 180 mg daily. He has erectile dysfunction with metoprolol; therefore, not to take metoprolol. His CHADS score is 1. He does have prediabetes and will need outpatient monitoring for this. He will be on aspirin and diltiazem. Will follow up with Dr. Kirke Corin in 2 weeks.  2. Prediabetes. The patient is being referred to diabetes education as an outpatient and will also need to be followed up with his primary care physician.  3. Essential hypertension. The patient will continue metoprolol and have close follow-up. 4. Hyperlipidemia. Continue atorvastatin. 5. Tobacco abuse. The patient was encouraged to stop smoking. He was counseled for 3 minutes. He will be discharged with nicotine patch.  DISCHARGE MEDICATIONS:  1. Aspirin 325 daily.  2. Diltiazem 160 mg daily. 3. Docusate 100 mg b.i.d.  4. Promethazine 5 mL q. 6 hours p.r.n.  5. Lidocaine topical 5 mL with 5 mL of Phenergan for swish and swallow. 6. Nicotine patch 21 mg for 24 hours  7. Cardizem 180 mg daily.  DISCHARGE DIET: Low-sodium.  DISCHARGE ACTIVITY: As  tolerated.  DISCHARGE FOLLOWUP: With Dr. Kirke Corin in 2 weeks.  TIME SPENT: Approximately 40 minutes.   The patient was stable for discharge.    ____________________________ Jaqlyn Gruenhagen P. Juliene Pina, MD spm:mw D: 09/21/2014 14:10:00 ET T: 09/21/2014 16:54:08 ET JOB#: [id number]  cc: Robi Mitter P. Juliene Pina, MD, <Dictator> Janyth Contes Aiman Noe MD ELECTRONICALLY SIGNED 09/27/2014 20:39

## 2014-09-30 NOTE — Consult Note (Signed)
General Aspect Primary Cardiologist: Dr. Fletcher Anon, MD ___________________  59 year old male with history of PAF (does not take daily long term anticoagulation, will take intermittently), nonobstructive CAD, HTN, HLD, and ongoing tobacco abuse who presented to [facility] ED on 4/20 with recurrence of a-fib with RVR and was discharged home. He returned to [facility] on 4/22 with a-fib with RVR and was admitted. ___________________  PMH: 1. PAF (does not take daily long term anticoagulation, will take intermittently) 2. Nonobstructive CAD by cardiac cath 12/2013 at [facility] 3. HTN 4. HLD 5. Ongoing tobacco abuse ____________________   Present Illness 59 year old male with the above problem list who presented to [facility] x 2 this week with a-fib with RVR. He has undergone an ablation procedure at [place] 20 years ago. He has been hospitalized multiple times over the past 2 years 2/2 a-fib with RVR. He recently had a hospitalization at [facility] in late August for symptoms concerning of unstable angina. EKG changes were concerning for anterior ST elevation. He underwent cardiac catheterization the showed mild nonobstructive disease. He did have vigorous LV systolic function without regional WMA. No LVH. He was in a-fib with RVR and he was treated accordingly with IV diltiazem. TEE/cardioversion was planned but the patient converted to normal sinus rhythm. He reports 4 episodes of symptomatic atrial fibrillation in 2015 alone. He is not aware of history of sleep apnea and does not have symptoms suggestive of it. He denies alcohol use.   He was admitted to [facility] 03/2014 for a-fib with RVR and treated accoringly with rate controlling agents with conversion to NSR. It was recommended to be discharged on Xarelto (CHADSVASc of 2) he however refused this medication stating he would only take aspirin. He apparently had a refill on file from his [facility] admission and began taking the Xarelto on his own accord. He stopped this approximately 2 weeks  ago on his own 2/2 "constipation." He was noted to have constipation at the time of his 03/2014 admission and self discontinued his diltiazem at that time as well. He has not seen GI for this.   He was last seen in our outpatient office on 02/06/2014 and advised to have an echo to evaluate for left ventricular hypertrophy. Holter 05/2014 no arrhythmia.   He presented to [facility] ED 4/20 and 4/22 for a-fib with RVR. He was discharged home on 4/20 and admitted on 4/22. He complains of sudden onset of tachy-palpitations with associated chest pain, rated 9/10. No associated SOB, diaphoresis, nausea, vomiting, presyncope, or syncope. He reports working at a very stressful job and has been under a lot of stress lately. He would like to get out of this job but states he is 59 years old (actual age is 54) and cannot leave. He asks for disability, he reports he has done this previously. He called EMS for his tachy-palpitations and was found to be in a-fib. He received IV Lopressor 5 mg x 1 with improvement in symptoms and asa 325 mg x 1.  Upon his arrival to [facility] he was found to be in a-fib with RVR. He received Cardizem CD 120 mg x 1. Heart rate has remained in the 150s-120s. Vitals show pulse in the 60s. Troponin negative x 3. K+ 3.9-->3.5. TSH pending. Mg pending. he notes palpitations during me exam.   Physical Exam:  GEN no acute distress   HEENT PERRL, hearing intact to voice, moist oral mucosa   NECK supple  trachea midline  no JVD  RESP normal resp effort  clear BS   CARD Irregular rate and rhythm  Tachycardic  No murmur   ABD denies tenderness  soft   EXTR negative edema   SKIN normal to palpation   NEURO cranial nerves intact   PSYCH alert, A+O to time, place, person   Review of Systems:  General: No Complaints   Skin: No Complaints   ENT: No Complaints   Eyes: No Complaints   Neck: No Complaints   Respiratory: No Complaints   Cardiovascular: Tightness  Palpitations    Gastrointestinal: No Complaints   Genitourinary: No Complaints   Vascular: No Complaints   Musculoskeletal: No Complaints   Neurologic: No Complaints   Hematologic: No Complaints   Endocrine: No Complaints   Psychiatric: No Complaints   Review of Systems: All other systems were reviewed and found to be negative   Medications/Allergies Reviewed Medications/Allergies reviewed   Family & Social History:  Family and Social History:  Family History Coronary Artery Disease  Hypertension  Diabetes Mellitus   Social History negative ETOH, negative Illicit drugs   + Tobacco Current (within 1 year)   Place of Living Home  lives with GF     Atrial Fibrillation:    Anxiety:    Sleep apnea:    Depression:    Hypercholesterolemia:    HTN:    enlarged heart:    Alcohol Abuse:    card cath:   Home Medications: Medication Instructions Status  IBU 800 mg oral tablet 1 tab(s) orally 3 times a day, As Needed - for Fever - for Headache  - for Pain  Active  lidocaine topical 2% mucous membrane solution 5 milliliter(s) mix with 5 ml of Phenergan DM for swish and swallow. Active  Promethazine DM 15 mg-6.25 mg/5 mL oral syrup 5 milliliter(s) orally every 6 hours, mix with 5 ml of Viscous Lidocaine for swish and swallow. Active  aspirin 325 mg oral tablet 1 tab(s) orally once a day Active  atorvastatin 80 mg oral tablet 1 tab(s) orally once a day (at bedtime) Active  Cardizem CD 120 mg/24 hours oral capsule, extended release 1 cap(s) orally once a day Active  docusate sodium 100 mg oral capsule 1 cap(s) orally 2 times a day Active   Lab Results:  Routine Chem:  22-Apr-16 02:00   Glucose, Serum  114 (65-99 NOTE: New Reference Range  08/07/14)  BUN 15 (6-20 NOTE: New Reference Range  08/07/14)  Creatinine (comp) 1.10 (0.61-1.24 NOTE: New Reference Range  08/07/14)  Sodium, Serum 139 (135-145 NOTE: New Reference Range  08/07/14)  Potassium, Serum 3.5 (3.5-5.1 NOTE: New  Reference Range  08/07/14)  Chloride, Serum 109 (101-111 NOTE: New Reference Range  08/07/14)  CO2, Serum 23 (22-32 NOTE: New Reference Range  08/07/14)  Calcium (Total), Serum 8.9 (8.9-10.3 NOTE: New Reference Range  08/07/14)  Anion Gap 7  eGFR (African American) >60  eGFR (Non-African American) >60 (eGFR values <56mL/min/1.73 m2 may be an indication of chronic kidney disease (CKD). Calculated eGFR is useful in patients with stable renal function. The eGFR calculation will not be reliable in acutely ill patients when serum creatinine is changing rapidly. It is not useful in patients on dialysis. The eGFR calculation may not be applicable to patients at the low and high extremes of body sizes, pregnant women, and vegetarians.)  Cardiac:  22-Apr-16 02:00   Troponin I <0.03 (0.00-0.03 0.03 ng/mL or less: NEGATIVE  Repeat testing in 3-6 hrs  if clinically indicated. >0.05 ng/mL:  POTENTIAL  MYOCARDIAL INJURY. Repeat  testing in 3-6 hrs if  clinically indicated. NOTE: An increase or decrease  of 30% or more on serial  testing suggests a  clinically important change NOTE: New Reference Range  08/07/14)    02:09   CPK-MB, Serum 3.0 (0.5-5.0 NOTE: New Reference Range  08/07/14)    06:32   Troponin I <0.03 (0.00-0.03 0.03 ng/mL or less: NEGATIVE  Repeat testing in 3-6 hrs  if clinically indicated. >0.05 ng/mL: POTENTIAL  MYOCARDIAL INJURY. Repeat  testing in 3-6 hrs if  clinically indicated. NOTE: An increase or decrease  of 30% or more on serial  testing suggests a  clinically important change NOTE: New Reference Range  08/07/14)  CPK-MB, Serum 3.4 (0.5-5.0 NOTE: New Reference Range  08/07/14)  Routine Hem:  22-Apr-16 02:00   WBC (CBC) 9.0  RBC (CBC) 4.79  Hemoglobin (CBC) 14.3  Hematocrit (CBC) 43.3  Platelet Count (CBC) 286 (Result(s) reported on 21 Sep 2014 at 07:14AM.)  MCV 90  MCH 29.8  MCHC 33.0  RDW  15.0   EKG:  EKG Interp. by me    Interpretation a-fib, 77 bpm, TWI I, II, V4-V6, 1 mm st depression aVF    No Known Allergies:   Vital Signs/Nurse's Notes: **Vital Signs.:   22-Apr-16 08:02  Vital Signs Type Routine  Temperature Temperature (F) 97.9  Celsius 36.6  Pulse Pulse 66  Respirations Respirations 19  Systolic BP Systolic BP 921  Diastolic BP (mmHg) Diastolic BP (mmHg) 83  Mean BP 96  Pulse Ox % Pulse Ox % 96  Pulse Ox Activity Level  At rest  Oxygen Delivery Room Air/ 21 %    Impression 59 year old male with history of PAF (does not take daily long term anticoagulation, will take intermittently), nonobstructive CAD, HTN, HLD, and ongoing tobacco abuse who presented to [facility] ED on 4/20 with recurrence of a-fib with RVR and was discharged home. He returned to [facility] on 4/22 with a-fib with RVR and was admitted.  1. Paroxysmal a-fib with RVR s/p ablation 20+ years ago at [facility]: - He has been taking Metoprolol only once daily due to concerns about ED.  -Has been taking Xarelto 20 mg intermittently at home (refused medication at time of discharge 03/2014), had refills on file with pharmacy from [facility] admission dating back to 12/2013, he filled Rx and began taking, stopped 2 weeks ago 2/2 constipation -CHADSVASc 1 (HTN ) - He is worried about anticoagulation side effects. Can use Aspirin for now and will consider anticoagulation if he develops DM.  -Should his episodes become more frequent and he become compliant could consider antiarrhythmics - Ok to discharge home on Diltiazem ER 180 mg once daily and Aspirin .  Follow up with me in 2 weeks.    2. Nonobstructive CAD by cardiac cath 12/2013: -Troponin negative x 3 -Anginal symptoms likely tachy-mediated  -Check echo to evaluate LV function and wall motion   3. HTN: -Well controlled -Continue current medications as above  4. Hyperglycemia:   Plan The patient was seen and examined. I modified the note to reflect my changes.   Rod Can   Electronic  Signatures: Kathlyn Sacramento (MD)  (Signed 22-Apr-16 14:07)  Authored: Impression/Plan  Co-Signer: General Aspect/Present Illness, History and Physical Exam, Review of System, Family & Social History, Past Medical History, Home Medications, Labs, EKG , Allergies, Vital Signs/Nurse's Notes, Impression/Plan Tiajah Oyster M (PA-C)  (Signed 22-Apr-16 11:06)  Authored: General Aspect/Present Illness, History and Physical  Exam, Review of System, Family & Social History, Past Medical History, Home Medications, Labs, EKG , Allergies, Vital Signs/Nurse's Notes, Impression/Plan   Last Updated: 22-Apr-16 14:07 by Kathlyn Sacramento (MD)

## 2014-09-30 NOTE — H&P (Signed)
PATIENT NAME:  Darren Rogers, Darren Rogers MR#:  [id number] DATE OF BIRTH:  07/01/1955  DATE OF ADMISSION:  09/21/2014  REFERRING PHYSICIAN: Enedina Finner. Manson Passey, MD   PRIMARY CARE PHYSICIAN: Nonlocal.   PRIMARY CARDIOLOGIST: Muhammad A. Kirke Corin, MD  ADMISSION DIAGNOSIS: Atrial fibrillation with rapid ventricular response.   HISTORY OF PRESENT ILLNESS: This is a 59 year old African American male who presents to the Emergency Department via EMS after complaining of palpitations and chest pain. The chest pain was 9/10 in severity and substernal. It did not radiate but was associated with shortness of breath as well as nausea. The patient denies any vomiting or diarrhea. EMS gave the patient aspirin 325 mg as well as 5 mg of IV  metoprolol that cause the pain to cease. The patient states this has happened many times before. In the Emergency Department, the patient was found to still have atrial fibrillation rhythm with rapid ventricular rate response, which prompted the Emergency Department to call for admission.   REVIEW OF SYSTEMS:  CONSTITUTIONAL: The patient denies fevers or weakness.  EYES: Denies blurred vision or inflammation.  EARS, NOSE AND THROAT: Denies tinnitus or sore throat.  RESPIRATORY: Denies cough, but admits to shortness of breath that was associated with chest pain. It is now resolved.  CARDIOVASCULAR: Admits to chest pain that is now resolved as well as palpitations, which he occasionally still feels. He denies orthopnea or paroxysmal nocturnal dyspnea.  GASTROINTESTINAL: Admits to nausea, which has resolved. He denies vomiting, diarrhea, or abdominal pain.  GENITOURINARY: Denies dysuria, increased frequency, or hesitancy of urination.  ENDOCRINE: Denies polyuria, polydipsia.  HEMATOLOGIC AND LYMPHATIC: Denies easy bruising or bleeding.  INTEGUMENTARY: Denies rashes or lesions.  MUSCULOSKELETAL: Denies arthralgias or myalgias.  NEUROLOGIC: Denies with numbness in his extremities or dysarthria.   PSYCHIATRIC: Denies depression or suicidal ideation.   PAST MEDICAL HISTORY: Atrial fibrillation.   SURGICAL HISTORY: Heart catheterization more than 30 years ago.   SOCIAL HISTORY: The patient lives with his sister. He has a 25-pack-year smoking history. He denies any drugs or alcohol use.   FAMILY HISTORY: The patient's mother and 2 sisters all suffer from diabetes. His father is deceased of coronary artery disease.   MEDICATIONS:  1.  Aspirin 325 mg 1 tablet p.o. daily.  2.  Atorvastatin 80 mg 1 tablet p.o. at bedtime.  3.  Cardizem CD 120 mg/24-hour capsule 1 capsule p.o. daily.  4.  Ibuprofen 800 mg 1 tablet p.o. t.i.d. as needed for fever, headache, or pain.  5.  Viscous lidocaine with Phenergan to swish and swallow as needed.  6.  Promethazine DM 15 mg/6.25 mg per 5 mL oral syrup 5 mL p.o. every 6 hours mixed with viscous lidocaine solution for swishing and swallowing.   ALLERGIES: No known drug allergies.   PERTINENT LABORATORY RESULTS AND RADIOGRAPHIC FINDINGS: Serum glucose is 114, BUN 15, creatinine 1.1, serum sodium 139, potassium 3.5, chloride is 109, bicarbonate is 23, calcium is 8.9. Troponin is negative. There is no chest x-ray.   PHYSICAL EXAMINATION:  VITAL SIGNS: Temperature is 98.2; pulse 97, although at bedside monitor is 116; respirations 18; blood pressure is 106/64; pulse oximetry is 98% on room air.  GENERAL: The patient is alert and oriented x 3 in no apparent distress.  HEENT: Normocephalic, atraumatic. Pupils equal, round, and reactive to light and accommodation. Extraocular movements are intact. Mucous membranes are moist.  NECK: Trachea is midline. No adenopathy. Thyroid nonpalpable and nontender.  CHEST: Symmetric and atraumatic.  CARDIOVASCULAR: Regular rate  and rhythm. Normal S1 and S2. No rubs, clicks, or murmurs appreciated.  LUNGS: Clear to auscultation bilaterally. Normal effort and excursion.  ABDOMEN: Positive bowel sounds. Soft, nontender,  nondistended. No hepatosplenomegaly.  GENITOURINARY: Deferred.  MUSCULOSKELETAL: The patient moves all 4 extremities equally. There is 5/5 strength in upper and lower extremities bilaterally. I have not observed the patient's gait.  SKIN: Warm and dry. There are no rashes or lesions.  EXTREMITIES: No clubbing, cyanosis, or edema.  NEUROLOGIC: Cranial nerves II-XII are grossly intact.  PSYCHIATRIC: Mood is normal. Affect is congruent. The patient has good insight and judgment into his medical condition.   ASSESSMENT AND PLAN: This is a 59 year old male admitted for atrial fibrillation with rapid ventricular rate.  1.  Atrial fibrillation: The patient's heart rate has slowed, but he still in the rapid ventricular rate. We will restart his oral medications, which should hopefully achieve rate control. I have consulted primary cardiologist, Dr. Kirke Corin. Also of note, the patient has chronic atrial fibrillation but is not on anticoagulation perhaps due to being a fall risk or perhaps work related hazard. We will continue his diltiazem for now.  2.  Chest pain: This was associated with palpitations but is currently resolved. We will follow the patient's cardiac biomarkers.  3.  Overweight: The patient's BMI is 27.5. I have encouraged a diet and exercise. 4.  Deep vein thrombosis prophylaxis: Heparin.  5.  Gastrointestinal prophylaxis: None, as the patient is not critically ill.   CODE STATUS: The patient is a full code.   TIME SPENT ON ADMISSION ORDERS AND PATIENT CARE: Approximately 40 minutes.   ____________________________ Kelton Pillar. Sheryle Hail, MD msd:bm D: 09/21/2014 05:32:51 ET T: 09/21/2014 05:42:48 ET JOB#: [id number]  cc: Kelton Pillar. Sheryle Hail, MD, <Dictator> Kelton Pillar Yamir Carignan MD ELECTRONICALLY SIGNED 09/23/2014 3:32

## 2014-10-04 ENCOUNTER — Encounter: Payer: 59 | Attending: Internal Medicine | Admitting: *Deleted

## 2014-10-04 DIAGNOSIS — R7303 Prediabetes: Secondary | ICD-10-CM

## 2014-10-04 NOTE — Progress Notes (Signed)
Pt came to initial appointment for diabetes accompanied by his fiance. He reported that he didn't have diabetes. He was referred by [facility] hospitalist for Type 2 program when he was hospitalized last month due to A-Fib. Pt doesn't have PCP and is followed by cardiologist. Per EPIC records his last A1C was 6.4 % done in October 2015. No recent A1C on file. Discussed FBG's and A1C for pre-diabetes and diabetes. Explained to patient that he could stay for this appointment or come to classes or be seen by RD or come to Pre-Diabetes Class.  He reports that he would rather have his labs drawn first to see if he actually has diabetes before coming to other appointments. He is scheduled to see Dr Kirke Corin this month. Pt decided not to stay today and will call back if he needs to schedule classes. Gave him phone number for [facility].

## 2014-10-18 ENCOUNTER — Encounter: Payer: Self-pay | Admitting: *Deleted

## 2014-10-18 ENCOUNTER — Other Ambulatory Visit: Payer: Self-pay | Admitting: Cardiovascular Disease

## 2014-10-18 ENCOUNTER — Encounter: Payer: Self-pay | Admitting: Cardiovascular Disease

## 2014-11-16 ENCOUNTER — Inpatient Hospital Stay: Payer: 59

## 2014-11-16 ENCOUNTER — Encounter: Payer: Self-pay | Admitting: Emergency Medicine

## 2014-11-16 ENCOUNTER — Inpatient Hospital Stay
Admission: EM | Admit: 2014-11-16 | Discharge: 2014-11-17 | DRG: 069 | Disposition: A | Discharge status: Home / Self Care | Payer: 59 | Attending: Internal Medicine | Admitting: Internal Medicine

## 2014-11-16 ENCOUNTER — Inpatient Hospital Stay: Admit: 2014-11-16 | Payer: 59

## 2014-11-16 ENCOUNTER — Emergency Department: Payer: 59

## 2014-11-16 DIAGNOSIS — I2510 Atherosclerotic heart disease of native coronary artery without angina pectoris: Secondary | ICD-10-CM | POA: Diagnosis present

## 2014-11-16 DIAGNOSIS — Z7982 Long term (current) use of aspirin: Secondary | ICD-10-CM | POA: Diagnosis not present

## 2014-11-16 DIAGNOSIS — G451 Carotid artery syndrome (hemispheric): Secondary | ICD-10-CM

## 2014-11-16 DIAGNOSIS — I482 Chronic atrial fibrillation: Secondary | ICD-10-CM | POA: Diagnosis present

## 2014-11-16 DIAGNOSIS — G8194 Hemiplegia, unspecified affecting left nondominant side: Secondary | ICD-10-CM | POA: Diagnosis present

## 2014-11-16 DIAGNOSIS — F17210 Nicotine dependence, cigarettes, uncomplicated: Secondary | ICD-10-CM | POA: Diagnosis present

## 2014-11-16 DIAGNOSIS — E785 Hyperlipidemia, unspecified: Secondary | ICD-10-CM | POA: Diagnosis present

## 2014-11-16 DIAGNOSIS — Z8249 Family history of ischemic heart disease and other diseases of the circulatory system: Secondary | ICD-10-CM | POA: Diagnosis not present

## 2014-11-16 DIAGNOSIS — G459 Transient cerebral ischemic attack, unspecified: Secondary | ICD-10-CM | POA: Diagnosis present

## 2014-11-16 DIAGNOSIS — I10 Essential (primary) hypertension: Secondary | ICD-10-CM | POA: Diagnosis present

## 2014-11-16 DIAGNOSIS — I639 Cerebral infarction, unspecified: Secondary | ICD-10-CM | POA: Diagnosis present

## 2014-11-16 DIAGNOSIS — H6122 Impacted cerumen, left ear: Secondary | ICD-10-CM | POA: Diagnosis present

## 2014-11-16 DIAGNOSIS — Z7901 Long term (current) use of anticoagulants: Secondary | ICD-10-CM | POA: Diagnosis not present

## 2014-11-16 DIAGNOSIS — I480 Paroxysmal atrial fibrillation: Secondary | ICD-10-CM | POA: Diagnosis present

## 2014-11-16 LAB — URINE DRUG SCREEN, QUALITATIVE (ARMC ONLY)
Amphetamines, Ur Screen: NOT DETECTED
BARBITURATES, UR SCREEN: NOT DETECTED
Benzodiazepine, Ur Scrn: NOT DETECTED
COCAINE METABOLITE, UR ~~LOC~~: NOT DETECTED
Cannabinoid 50 Ng, Ur ~~LOC~~: NOT DETECTED
MDMA (Ecstasy)Ur Screen: NOT DETECTED
Methadone Scn, Ur: NOT DETECTED
Opiate, Ur Screen: NOT DETECTED
Phencyclidine (PCP) Ur S: NOT DETECTED
TRICYCLIC, UR SCREEN: NOT DETECTED

## 2014-11-16 LAB — CBC WITH DIFFERENTIAL/PLATELET
Basophils Absolute: 0 10*3/uL (ref 0–0.1)
Basophils Relative: 1 %
EOS PCT: 3 %
Eosinophils Absolute: 0.2 10*3/uL (ref 0–0.7)
HEMATOCRIT: 40.9 % (ref 40.0–52.0)
Hemoglobin: 13.2 g/dL (ref 13.0–18.0)
LYMPHS ABS: 2.4 10*3/uL (ref 1.0–3.6)
Lymphocytes Relative: 33 %
MCH: 29.2 pg (ref 26.0–34.0)
MCHC: 32.4 g/dL (ref 32.0–36.0)
MCV: 90.3 fL (ref 80.0–100.0)
MONO ABS: 0.8 10*3/uL (ref 0.2–1.0)
Monocytes Relative: 11 %
Neutro Abs: 3.7 10*3/uL (ref 1.4–6.5)
Neutrophils Relative %: 52 %
Platelets: 240 10*3/uL (ref 150–440)
RBC: 4.53 MIL/uL (ref 4.40–5.90)
RDW: 15.2 % — AB (ref 11.5–14.5)
WBC: 7.2 10*3/uL (ref 3.8–10.6)

## 2014-11-16 LAB — COMPREHENSIVE METABOLIC PANEL
ALBUMIN: 3.8 g/dL (ref 3.5–5.0)
ALK PHOS: 71 U/L (ref 38–126)
ALT: 15 U/L — ABNORMAL LOW (ref 17–63)
ANION GAP: 6 (ref 5–15)
AST: 20 U/L (ref 15–41)
BUN: 16 mg/dL (ref 6–20)
CO2: 28 mmol/L (ref 22–32)
Calcium: 9.3 mg/dL (ref 8.9–10.3)
Chloride: 109 mmol/L (ref 101–111)
Creatinine, Ser: 1.17 mg/dL (ref 0.61–1.24)
GFR calc Af Amer: >60 mL/min (ref >60)
GFR calc non Af Amer: >60 mL/min (ref >60)
Glucose, Bld: 81 mg/dL (ref 65–99)
Potassium: 3.9 mmol/L (ref 3.5–5.1)
SODIUM: 143 mmol/L (ref 135–145)
TOTAL PROTEIN: 6.9 g/dL (ref 6.5–8.1)
Total Bilirubin: 0.4 mg/dL (ref 0.3–1.2)

## 2014-11-16 LAB — URINALYSIS COMPLETE WITH MICROSCOPIC (ARMC ONLY)
BACTERIA UA: NONE SEEN
Bilirubin Urine: NEGATIVE
GLUCOSE, UA: NEGATIVE mg/dL
Hgb urine dipstick: NEGATIVE
Ketones, ur: NEGATIVE mg/dL
Leukocytes, UA: NEGATIVE
NITRITE: NEGATIVE
Protein, ur: NEGATIVE mg/dL
Specific Gravity, Urine: 1.021 (ref 1.005–1.030)
pH: 5 (ref 5.0–8.0)

## 2014-11-16 LAB — APTT: APTT: 29 s (ref 24–36)

## 2014-11-16 LAB — TROPONIN I: Troponin I: <0.03 ng/mL (ref <0.031)

## 2014-11-16 LAB — PROTIME-INR
INR: 0.93
Prothrombin Time: 12.7 seconds (ref 11.4–15.0)

## 2014-11-16 MED ORDER — ACETAMINOPHEN 325 MG PO TABS
650.0000 mg | ORAL_TABLET | Freq: Four times a day (QID) | ORAL | Status: DC | PRN
Start: 2014-11-16 — End: 2014-11-17

## 2014-11-16 MED ORDER — ACETAMINOPHEN 650 MG RE SUPP: 650.0000 mg | Freq: Four times a day (QID) | RECTAL | Status: DC | PRN

## 2014-11-16 MED ORDER — ENOXAPARIN SODIUM 40 MG/0.4ML ~~LOC~~ SOLN
40.0000 mg | SUBCUTANEOUS | Status: DC
  Filled 2014-11-16 (×2): qty 0.4

## 2014-11-16 MED ORDER — ASPIRIN EC 81 MG PO TBEC
81.0000 mg | DELAYED_RELEASE_TABLET | Freq: Every day | ORAL | Status: DC
  Administered 2014-11-17: 81 mg via ORAL
  Filled 2014-11-16: qty 1

## 2014-11-16 MED ORDER — ASPIRIN 81 MG PO CHEW
CHEWABLE_TABLET | ORAL | Status: AC
  Administered 2014-11-16: 324 mg via ORAL
  Filled 2014-11-16: qty 4

## 2014-11-16 MED ORDER — ATORVASTATIN CALCIUM 20 MG PO TABS
40.0000 mg | ORAL_TABLET | Freq: Every day | ORAL | Status: DC
  Administered 2014-11-16 – 2014-11-17 (×2): 40 mg via ORAL
  Filled 2014-11-16: qty 2

## 2014-11-16 MED ORDER — CARBAMIDE PEROXIDE 6.5 % OT SOLN
5.0000 [drp] | Freq: Two times a day (BID) | OTIC | Status: DC
  Administered 2014-11-16: 5 [drp] via OTIC
  Filled 2014-11-16: qty 15

## 2014-11-16 MED ORDER — SODIUM CHLORIDE 0.9 % IJ SOLN
3.0000 mL | Freq: Two times a day (BID) | INTRAMUSCULAR | Status: DC
  Administered 2014-11-16 (×2): 3 mL via INTRAVENOUS

## 2014-11-16 MED ORDER — CLOPIDOGREL BISULFATE 75 MG PO TABS
75.0000 mg | ORAL_TABLET | Freq: Every day | ORAL | Status: DC
  Administered 2014-11-16 – 2014-11-17 (×2): 75 mg via ORAL
  Filled 2014-11-16 (×2): qty 1

## 2014-11-16 MED ORDER — ASPIRIN 81 MG PO CHEW
324.0000 mg | CHEWABLE_TABLET | Freq: Once | ORAL | Status: AC
  Administered 2014-11-16: 324 mg via ORAL

## 2014-11-16 NOTE — ED Provider Notes (Signed)
[facility] Emergency Department Provider Note  ____________________________________________  Time seen: On arrival  I have reviewed the triage vital signs and the nursing notes.   HISTORY  Chief Complaint Weakness      HPI Darren Rogers is a 59 y.o. male who presents with left arm weakness for 2 days. He notes his left arm has felt heavy and not right 2 days. He denies injury. He reports a history of atrial fibrillation and he says he takes metoprolol for this but no blood thinners. Denies fevers chills. He has no other weakness. No blurry vision. No chest pain. No difficult in speaking     Past Medical History  Diagnosis Date  . Tobacco abuse     a. ongoing  . Paroxysmal atrial fibrillation     a. s/p ablation procedure ~ 20 yrs ago; b. very symptomatic when goes into a-fib w/ RVR  . Hypertension   . Hyperlipidemia   . History of cardiac cath     a. cath 01/27/2014: LM widely patent, LAD mild diff dz w/o obs, LCx no sig obs - scattered 20-30% mLCx, RCA no obs dz, EF 70%  . History of echocardiogram     a. 03/29/2014: EF 55-60%, mild LVH, normal RVSP  . CAD (coronary artery disease) 04/11/2014    Patient Active Problem List   Diagnosis Date Noted  . CAD in native artery 04/11/2014  . History of echocardiogram   . Paroxysmal atrial fibrillation   . STEMI (ST elevation myocardial infarction) 01/29/2014  . Tobacco abuse   . Hyperlipidemia   . Hypertension   . Atrial fibrillation with rapid ventricular response 01/27/2014    Past Surgical History  Procedure Laterality Date  . Cardiac catheterization    . Cardiac catheterization      [facility]  . Left heart cath Right 01/27/2014    Procedure: LEFT HEART CATH;  Surgeon: Micheline Chapman, MD;  Location: [facility] CATH LAB;  Service: Cardiovascular;  Laterality: Right;    Current Outpatient Rx  Name  Route  Sig  Dispense  Refill  . aspirin EC 81 MG tablet   Oral   Take 81 mg by mouth daily.          Marland Kitchen diltiazem (CARDIZEM CD) 180 MG 24 hr capsule   Oral   Take 1 capsule (180 mg total) by mouth daily.   30 capsule   11   . metoprolol (LOPRESSOR) 50 MG tablet      TAKE ONE TABLET TWICE DAILY AS NEEDED   30 tablet   3   . Multiple Vitamin (MULTIVITAMIN) tablet   Oral   Take 1 tablet by mouth daily.         Marland Kitchen atorvastatin (LIPITOR) 40 MG tablet   Oral   Take 1 tablet (40 mg total) by mouth daily. Patient not taking: Reported on 11/16/2014   90 tablet   3   . rivaroxaban (XARELTO) 20 MG TABS tablet   Oral   Take 1 tablet (20 mg total) by mouth daily with supper. Patient not taking: Reported on 11/16/2014   30 tablet   11     Allergies Review of patient's allergies indicates no known allergies.  Family History  Problem Relation Age of Onset  . Coronary artery disease Father 77    Social History History  Substance Use Topics  . Smoking status: Current Every Day Smoker -- 1.00 packs/day for 30 years    Types: Cigarettes  . Smokeless tobacco:  Never Used  . Alcohol Use: No    Review of Systems  Constitutional: Negative for fever. Eyes: Negative for visual changes. ENT: Negative for sore throat Cardiovascular: Negative for chest pain. Respiratory: Negative for shortness of breath. Gastrointestinal: Negative for abdominal pain, vomiting and diarrhea. Genitourinary: Negative for dysuria. Musculoskeletal: Negative for back pain. Skin: Negative for rash. Neurological: Negative for headaches, positive for left arm weakness   10-point ROS otherwise negative.  ____________________________________________   PHYSICAL EXAM:  VITAL SIGNS: ED Triage Vitals  Enc Vitals Group     BP 11/16/14 0912 100/61 mmHg     Pulse Rate 11/16/14 0912 57     Resp 11/16/14 0912 18     Temp 11/16/14 0912 97.6 F (36.4 C)     Temp src --      SpO2 11/16/14 0912 98 %     Weight 11/16/14 0912 170 lb (77.111 kg)     Height 11/16/14 0912  (1.676 m)     Head Cir --       Peak Flow --      Pain Score --      Pain Loc --      Pain Edu? --      Excl. in GC? --      Constitutional: Alert and oriented. Well appearing and in no distress. Eyes: Conjunctivae are normal. PERRL. ENT   Head: Normocephalic and atraumatic.   Nose: No rhinnorhea.   Mouth/Throat: Mucous membranes are moist. Cardiovascular: Normal rate, regular rhythm. Normal and symmetric distal pulses are present in all extremities. No murmurs, rubs, or gallops. Respiratory: Normal respiratory effort without tachypnea nor retractions. Breath sounds are clear and equal bilaterally.  Gastrointestinal: Soft and non-tender in all quadrants. No distention. There is no CVA tenderness. Genitourinary: deferred Musculoskeletal: Mild objective weakness in the left arm Neurologic:  Normal speech and language. Left arm weakness which is mild. No other focal deficits noted. No facial droop Skin:  Skin is warm, dry and intact. No rash noted. Psychiatric: Mood and affect are normal. Patient exhibits appropriate insight and judgment.  ____________________________________________    LABS (pertinent positives/negatives)  Labs Reviewed  COMPREHENSIVE METABOLIC PANEL - Abnormal; Notable for the following:    ALT 15 (*)    All other components within normal limits  CBC WITH DIFFERENTIAL/PLATELET - Abnormal; Notable for the following:    RDW 15.2 (*)    All other components within normal limits  APTT  TROPONIN I  PROTIME-INR  URINE RAPID DRUG SCREEN, HOSP PERFORMED  URINALYSIS COMPLETEWITH MICROSCOPIC ([facility] ONLY)    ____________________________________________   EKG  ED ECG REPORT I, Jene Every, the attending physician, personally viewed and interpreted this ECG.   Date: 11/16/2014  EKG Time: 9:42 AM  Rate: 57  Rhythm: sinus bradycardia  Axis: Normal  Intervals:none  ST&T Change: T wave inversions V5 and V6   ____________________________________________    RADIOLOGY  CT  concerning for right internal capsule stroke  ____________________________________________   PROCEDURES  Procedure(s) performed: none  Critical Care performed: none  ____________________________________________   INITIAL IMPRESSION / ASSESSMENT AND PLAN / ED COURSE  Pertinent labs & imaging results that were available during my care of the patient were reviewed by me and considered in my medical decision making (see chart for details).  Patient with isolated left arm weakness concerning for CVA, especially given history of hypertension hyperlipidemia and A. Fib. Symptoms began greater than 48 hours ago. He is not a TPA candidate. NIH stroke scale:  1  ____________________________________________ ----------------------------------------- 11:05 AM on 11/16/2014 ----------------------------------------- CT concerning for right internal capsule stroke which would correlate with his left arm weakness. We will give aspirin and admit to the hospital service for further workup   FINAL CLINICAL IMPRESSION(S) / ED DIAGNOSES  Final diagnoses:  Cerebral infarction due to unspecified mechanism     Jene Every, MD 11/16/14 1106

## 2014-11-16 NOTE — ED Notes (Signed)
Patient transported to CT 

## 2014-11-16 NOTE — Consult Note (Signed)
CC: L sided weakness   HPI: Darren Rogers is an 59 y.o. male male with a known history of paroxysmal atrial fibrillation. Patient states that he started developing weakness on Wednesday morning in his LUE.  has some incoordination of his fingers especially when trying to call his pants. The patient had stopped his Xarelto a long time ago secondary to constipation. MRI brain no acute abnormality.   Past Medical History  Diagnosis Date  . Tobacco abuse     a. ongoing  . Paroxysmal atrial fibrillation     a. s/p ablation procedure ~ 20 yrs ago; b. very symptomatic when goes into a-fib w/ RVR  . Hypertension   . Hyperlipidemia   . History of cardiac cath     a. cath 01/27/2014: LM widely patent, LAD mild diff dz w/o obs, LCx no sig obs - scattered 20-30% mLCx, RCA no obs dz, EF 70%  . History of echocardiogram     a. 03/29/2014: EF 55-60%, mild LVH, normal RVSP  . CAD (coronary artery disease) 04/11/2014    Past Surgical History  Procedure Laterality Date  . Cardiac catheterization    . Cardiac catheterization      [facility]  . Left heart cath Right 01/27/2014    Procedure: LEFT HEART CATH;  Surgeon: Micheline Chapman, MD;  Location: [facility] CATH LAB;  Service: Cardiovascular;  Laterality: Right;    Family History  Problem Relation Age of Onset  . Coronary artery disease Father 66    Social History:  reports that he has been smoking Cigarettes.  He has a 30 pack-year smoking history. He has never used smokeless tobacco. He reports that he does not drink alcohol or use illicit drugs.  No Known Allergies  Medications: I have reviewed the patient's current medications.  ROS: History obtained from the patient  General ROS: negative for - chills, fatigue, fever, night sweats, weight gain or weight loss Psychological ROS: negative for - behavioral disorder, hallucinations, memory difficulties, mood swings or suicidal ideation Ophthalmic ROS: negative for - blurry vision, double vision, eye pain  or loss of vision ENT ROS: negative for - epistaxis, nasal discharge, oral lesions, sore throat, tinnitus or vertigo Allergy and Immunology ROS: negative for - hives or itchy/watery eyes Hematological and Lymphatic ROS: negative for - bleeding problems, bruising or swollen lymph nodes Endocrine ROS: negative for - galactorrhea, hair pattern changes, polydipsia/polyuria or temperature intolerance Respiratory ROS: negative for - cough, hemoptysis, shortness of breath or wheezing Cardiovascular ROS: negative for - chest pain, dyspnea on exertion, edema or irregular heartbeat Gastrointestinal ROS: negative for - abdominal pain, diarrhea, hematemesis, nausea/vomiting or stool incontinence Genito-Urinary ROS: negative for - dysuria, hematuria, incontinence or urinary frequency/urgency Musculoskeletal ROS: negative for - joint swelling or muscular weakness Neurological ROS: as noted in HPI Dermatological ROS: negative for rash and skin lesion changes  Physical Examination: Blood pressure 122/69, pulse 65, temperature 98 F (36.7 C), temperature source Oral, resp. rate 19, height  (1.676 m), weight 72.621 kg (160 lb 1.6 oz), SpO2 99 %.   Neurological Examination Mental Status: Alert, oriented, thought content appropriate.  Speech fluent without evidence of aphasia.  Able to follow 3 step commands without difficulty. Cranial Nerves: II: Discs flat bilaterally; Visual fields grossly normal, pupils equal, round, reactive to light and accommodation III,IV, VI: ptosis not present, extra-ocular motions intact bilaterally V,VII: smile symmetric, facial light touch sensation normal bilaterally VIII: hearing normal bilaterally IX,X: gag reflex present XI: bilateral shoulder shrug XII: midline  tongue extension Motor: Right : Upper extremity   5/5    Left:     Upper extremity   5/5  Lower extremity   5/5     Lower extremity   5/5  Weak L hand grip Tone and bulk:normal tone throughout; no atrophy  noted Sensory: Pinprick and light touch intact throughout, bilaterally Deep Tendon Reflexes: 2+ and symmetric throughout Plantars: Right: downgoing   Left: downgoing Cerebellar: normal finger-to-nose, normal rapid alternating movements and normal heel-to-shin test Gait: normal gait and station      Laboratory Studies:   Basic Metabolic Panel:  Recent Labs Lab 11/16/14 0949  NA 143  K 3.9  CL 109  CO2 28  GLUCOSE 81  BUN 16  CREATININE 1.17  CALCIUM 9.3    Liver Function Tests:  Recent Labs Lab 11/16/14 0949  AST 20  ALT 15*  ALKPHOS 71  BILITOT 0.4  PROT 6.9  ALBUMIN 3.8   No results for input(s): LIPASE, AMYLASE in the last 168 hours. No results for input(s): AMMONIA in the last 168 hours.  CBC:  Recent Labs Lab 11/16/14 0949  WBC 7.2  NEUTROABS 3.7  HGB 13.2  HCT 40.9  MCV 90.3  PLT 240    Cardiac Enzymes:  Recent Labs Lab 11/16/14 0949  TROPONINI <0.03    BNP: Invalid input(s): POCBNP  CBG: No results for input(s): GLUCAP in the last 168 hours.  Microbiology: Results for orders placed or performed during the hospital encounter of 01/27/14  MRSA PCR Screening     Status: None   Collection Time: 01/27/14  4:19 PM  Result Value Ref Range Status   MRSA by PCR NEGATIVE NEGATIVE Final    Comment:        The GeneXpert MRSA Assay (FDA approved for NASAL specimens only), is one component of a comprehensive MRSA colonization surveillance program. It is not intended to diagnose MRSA infection nor to guide or monitor treatment for MRSA infections.    Coagulation Studies:  Recent Labs  11/16/14 0949  LABPROT 12.7  INR 0.93    Urinalysis:  Recent Labs Lab 11/16/14 1255  COLORURINE YELLOW*  LABSPEC 1.021  PHURINE 5.0  GLUCOSEU NEGATIVE  HGBUR NEGATIVE  BILIRUBINUR NEGATIVE  KETONESUR NEGATIVE  PROTEINUR NEGATIVE  NITRITE NEGATIVE  LEUKOCYTESUR NEGATIVE    Lipid Panel:     Component Value Date/Time   CHOL 191  01/28/2014 0431   TRIG 226* 01/28/2014 0431   HDL 32* 01/28/2014 0431   CHOLHDL 6.0 01/28/2014 0431   VLDL 45* 01/28/2014 0431   LDLCALC 114* 01/28/2014 0431    HgbA1C:  Lab Results  Component Value Date   HGBA1C 6.3* 01/27/2014    Urine Drug Screen:     Component Value Date/Time   LABOPIA NONE DETECTED 11/16/2014 1255   LABBENZ NONE DETECTED 11/16/2014 1255   AMPHETMU NONE DETECTED 11/16/2014 1255   THCU NONE DETECTED 11/16/2014 1255   LABBARB NONE DETECTED 11/16/2014 1255    Alcohol Level: No results for input(s): ETH in the last 168 hours.  Other results: EKG: atrial fibrillation, rate 80s.  Imaging: Ct Head Wo Contrast  11/16/2014   CLINICAL DATA:  LEFT-sided weakness since 11/14/2014. No other symptoms.  EXAM: CT HEAD WITHOUT CONTRAST  TECHNIQUE: Contiguous axial images were obtained from the base of the skull through the vertex without intravenous contrast.  COMPARISON:  None.  FINDINGS: Subtle asymmetric hypoattenuation in the region of the RIGHT posterior limb internal capsule, possibly 1 cm in diameter,  could represent developing acute infarction. If no contraindications, MRI could better assess.  No cortical hypodensity, hemorrhage, mass lesion, hydrocephalus, or extra-axial fluid.  Slight premature for age atrophy. Possible early chronic microvascular ischemic change. Mild vascular calcification in the carotid, LEFT vertebral, and basilar arteries. No features suggestive of large vessel occlusion. No osseous lesions. Sinuses and mastoids are clear.  IMPRESSION: Cannot exclude a deep white matter infarct affecting the RIGHT posterior limb internal capsule. Correlate clinically for LEFT-sided weakness. If no contraindications, MRI brain without contrast recommended.   Electronically Signed   By: Elsie Stain M.D.   On: 11/16/2014 10:21   Mr Brain Wo Contrast  11/16/2014   CLINICAL DATA:  Left-sided weakness since 2 days ago.  EXAM: MRI HEAD WITHOUT CONTRAST  TECHNIQUE:  Multiplanar, multiecho pulse sequences of the brain and surrounding structures were obtained without intravenous contrast.  COMPARISON:  CT same day  FINDINGS: Diffusion imaging does not show any acute or subacute infarction. The brainstem and cerebellum are normal. The cerebral hemispheres are normal. No evidence of old or acute small or large vessel infarction. No mass lesion, hemorrhage, hydrocephalus or extra-axial collection. No pituitary mass. 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 Major vessels at the base of the brain show flow.  IMPRESSION: Normal appearance of the brain itself.  Left maxillary sinusitis.   Electronically Signed   By: Paulina Fusi M.D.   On: 11/16/2014 15:26     Assessment/Plan:  HPI: Darren Rogers is an 59 y.o. male male with a known history of paroxysmal atrial fibrillation. Patient states that he started developing weakness on Wednesday morning in his LUE.  has some incoordination of his fingers especially when trying to call his pants. The patient had stopped his Xarelto a long time ago secondary to constipation. MRI brain no acute abnormality. Only deficits on neuro exam is slight weakness on L hand grip.  - Chronic A-fib- would try switching to Elaquis. Pt states he would consider it - s/p echo and carotid doppler, awaiting for report - d/c planning in AM [facility] [place]    11/16/2014, 4:08 PM

## 2014-11-16 NOTE — ED Notes (Signed)
Reports left arm weakness since last night

## 2014-11-16 NOTE — Progress Notes (Signed)
PT Cancellation Note  Patient Details Name: Darren Rogers MRN: [id number] DOB: 10-08-1955   Cancelled Treatment:    Reason Eval/Treat Not Completed: Patient at procedure or test/unavailable (Consult received and chart reviewed.  Patient currently off unit for diagnostic testing.  Will re-attempt at later time/date as patient available and medically appropriate.)   Laury Axon 11/16/2014, 2:32 PM

## 2014-11-16 NOTE — H&P (Addendum)
[facility] - [place] at [facility]   PATIENT NAME: Darren Rogers    MR#:  [id number]  DATE OF BIRTH:  1955-11-07  DATE OF ADMISSION:  11/16/2014  PRIMARY CARE PHYSICIAN: No PCP Per Patient , cardiology Dr. Kirke Corin  REQUESTING/REFERRING PHYSICIAN: Jene Every  CHIEF COMPLAINT:   Chief Complaint  Patient presents with  . Weakness    HISTORY OF PRESENT ILLNESS:  Darren Rogers  is a 59 y.o. male with a known history of paroxysmal atrial fibrillation. Patient states that he started developing weakness on Wednesday morning. The weakness was on his left arm. He states that his motor skills of been off. His arm feels like there is a tickle and it. He has some incoordination of his fingers especially when trying to call his pants. The patient had stopped his Xarelto a long time ago secondary to constipation. He states that he will not go back on Xarelto ever. In the ER he had a CT scan of the head that could not rule out a infarct in the right posterior limb internal capsule. Hospitalist services were contacted for further evaluation.  PAST MEDICAL HISTORY:   Past Medical History  Diagnosis Date  . Tobacco abuse     a. ongoing  . Paroxysmal atrial fibrillation     a. s/p ablation procedure ~ 20 yrs ago; b. very symptomatic when goes into a-fib w/ RVR  . Hypertension   . Hyperlipidemia   . History of cardiac cath     a. cath 01/27/2014: LM widely patent, LAD mild diff dz w/o obs, LCx no sig obs - scattered 20-30% mLCx, RCA no obs dz, EF 70%  . History of echocardiogram     a. 03/29/2014: EF 55-60%, mild LVH, normal RVSP  . CAD (coronary artery disease) 04/11/2014    PAST SURGICAL HISTORY:   Past Surgical History  Procedure Laterality Date  . Cardiac catheterization    . Cardiac catheterization      [place]  . Left heart cath Right 01/27/2014    Procedure: LEFT HEART CATH;  Surgeon: Micheline Chapman, MD;  Location: [facility] CATH LAB;  Service: Cardiovascular;   Laterality: Right;    SOCIAL HISTORY:   History  Substance Use Topics  . Smoking status: Current Every Day Smoker -- 1.00 packs/day for 30 years    Types: Cigarettes  . Smokeless tobacco: Never Used  . Alcohol Use: No    FAMILY HISTORY:   Family History  Problem Relation Age of Onset  . Coronary artery disease Father 38    DRUG ALLERGIES:  No Known Allergies  REVIEW OF SYSTEMS:  CONSTITUTIONAL: No fever, fatigue, positive for left arm weakness  EYES: No blurred or double vision.  EARS, NOSE, AND THROAT: No tinnitus or ear pain. No sore throat RESPIRATORY: No cough, positive for occasional shortness of breath, no wheezing or hemoptysis.  CARDIOVASCULAR: No chest pain, orthopnea, edema.  GASTROINTESTINAL: No nausea, vomiting, diarrhea or abdominal pain. No blood in bowel movements. History of constipation. GENITOURINARY: No dysuria, hematuria.  ENDOCRINE: No polyuria, nocturia,  HEMATOLOGY: No anemia, easy bruising or bleeding SKIN: No rash or lesion. MUSCULOSKELETAL: No joint pain or arthritis.   NEUROLOGIC: Left sided weakness 4+ out of 5 on the left upper and lower extremity. Sensation intact bilaterally. Babinski negative. Cranial nerves II through XII grossly intact. PSYCHIATRY: No anxiety or depression.   MEDICATIONS AT HOME:   Prior to Admission medications   Medication Sig Start Date End Date Taking? Authorizing  Provider  aspirin EC 81 MG tablet Take 81 mg by mouth daily.   Yes Historical Provider, MD  diltiazem (CARDIZEM CD) 180 MG 24 hr capsule Take 1 capsule (180 mg total) by mouth daily. 01/29/14  Yes Janetta Hora, PA-C  metoprolol (LOPRESSOR) 50 MG tablet TAKE ONE TABLET TWICE DAILY AS NEEDED 10/19/14  Yes Iran Ouch, MD  Multiple Vitamin (MULTIVITAMIN) tablet Take 1 tablet by mouth daily.   Yes Historical Provider, MD  atorvastatin (LIPITOR) 40 MG tablet Take 1 tablet (40 mg total) by mouth daily. Patient not taking: Reported on 11/16/2014 02/06/14    Iran Ouch, MD  rivaroxaban (XARELTO) 20 MG TABS tablet Take 1 tablet (20 mg total) by mouth daily with supper. Patient not taking: Reported on 11/16/2014 01/29/14   Janetta Hora, PA-C    The patient states that the only medication that he is taking is aspirin and metoprolol.  VITAL SIGNS:  Blood pressure 103/65, pulse 57, temperature 97.6 F (36.4 C), resp. rate 18, height 5\' 6"  (1.676 m), weight 77.111 kg (170 lb), SpO2 98 %.  PHYSICAL EXAMINATION:  GENERAL:  59 y.o.-year-old patient lying in the bed with no acute distress.  EYES: Pupils equal, round, reactive to light and accommodation. No scleral icterus. Extraocular muscles intact.  HEENT: Head atraumatic, normocephalic. Oropharynx and nasopharynx clear. Left tympanic membrane cannot be visualized secondary to wax impaction. Right tympanic membrane no erythema. NECK:  Supple, no jugular venous distention. No thyroid enlargement, no tenderness.  LUNGS: Normal breath sounds bilaterally, no wheezing, rales,rhonchi or crepitation. No use of accessory muscles of respiration.  CARDIOVASCULAR: S1, S2 normal. No murmurs, rubs, or gallops.  ABDOMEN: Soft, nontender, nondistended. Bowel sounds present. No organomegaly or mass.  EXTREMITIES: No pedal edema, cyanosis, or clubbing.  NEUROLOGIC: Cranial nerves II through XII are intact. Muscle strength 5/5 in all extremities. Sensation intact. Gait not checked.  PSYCHIATRIC: The patient is alert and oriented x 3.  SKIN: No rash, lesion, or ulcer.   LABORATORY PANEL:   CBC  Recent Labs Lab 11/16/14 0949  WBC 7.2  HGB 13.2  HCT 40.9  PLT 240   Chemistries   Recent Labs Lab 11/16/14 0949  NA 143  K 3.9  CL 109  CO2 28  GLUCOSE 81  BUN 16  CREATININE 1.17  CALCIUM 9.3  AST 20  ALT 15*  ALKPHOS 71  BILITOT 0.4   Cardiac Enzymes  Recent Labs Lab 11/16/14 0949  TROPONINI <0.03   RADIOLOGY:  Ct Head Wo Contrast  11/16/2014   CLINICAL DATA:  LEFT-sided weakness  since 11/14/2014. No other symptoms.  EXAM: CT HEAD WITHOUT CONTRAST  TECHNIQUE: Contiguous axial images were obtained from the base of the skull through the vertex without intravenous contrast.  COMPARISON:  None.  FINDINGS: Subtle asymmetric hypoattenuation in the region of the RIGHT posterior limb internal capsule, possibly 1 cm in diameter, could represent developing acute infarction. If no contraindications, MRI could better assess.  No cortical hypodensity, hemorrhage, mass lesion, hydrocephalus, or extra-axial fluid.  Slight premature for age atrophy. Possible early chronic microvascular ischemic change. Mild vascular calcification in the carotid, LEFT vertebral, and basilar arteries. No features suggestive of large vessel occlusion. No osseous lesions. Sinuses and mastoids are clear.  IMPRESSION: Cannot exclude a deep white matter infarct affecting the RIGHT posterior limb internal capsule. Correlate clinically for LEFT-sided weakness. If no contraindications, MRI brain without contrast recommended.   Electronically Signed   By: [place].D.  On: 11/16/2014 10:21    EKG:   Sinus bradycardia 57 bpm left atrial enlargement nonspecific ST-T wave changes.  IMPRESSION AND PLAN:   1. Acute CVA with left-sided weakness. Since the patient takes aspirin at home I will give a dose of Plavix at this point. If an acute stroke is confirmed on MRI then a step up to Eliquis should be done. I will get an MRI of the brain, carotid ultrasound, echocardiogram. I will get a physical therapy and occupational therapy consultation. Start statin and check lipid profile 2. Essential hypertension- blood pressure on the lower side will hold metoprolol today. 3. History of atrial fibrillation. Currently in normal sinus rhythm. Monitor on telemetry. If this turns out being her stroke may have to start Eliquis. Patient is rate controlled at this point. 4. Tobacco abuse- smoking cessation counseling done 3 minutes by  me. 5. Impacted cerumen left ear- Debrox otic solution 5 drops twice a day.  All the records are reviewed and case discussed with ED provider. Management plans discussed with the patient, family and they are in agreement.  CODE STATUS: Full code TOTAL TIME TAKING CARE OF THIS PATIENT: 55 minutes.    Alford Highland M.D on 11/16/2014 at 12:16 PM  Between 7am to 6pm - Pager - [phone]  After 6pm call admission pager [phone]  [facility]  Office  [phone]  CC: Primary care physician; cardiologist Darrick Meigs

## 2014-11-16 NOTE — ED Notes (Signed)
Admitting MD at bedside.

## 2014-11-17 LAB — BASIC METABOLIC PANEL
Anion gap: 8 (ref 5–15)
BUN: 21 mg/dL — AB (ref 6–20)
CO2: 23 mmol/L (ref 22–32)
Calcium: 8.7 mg/dL — ABNORMAL LOW (ref 8.9–10.3)
Chloride: 109 mmol/L (ref 101–111)
Creatinine, Ser: 1.08 mg/dL (ref 0.61–1.24)
GFR calc non Af Amer: >60 mL/min (ref >60)
Glucose, Bld: 110 mg/dL — ABNORMAL HIGH (ref 65–99)
Potassium: 4 mmol/L (ref 3.5–5.1)
Sodium: 140 mmol/L (ref 135–145)

## 2014-11-17 LAB — LIPID PANEL
CHOLESTEROL: 260 mg/dL — AB (ref 0–200)
HDL: 40 mg/dL — ABNORMAL LOW (ref >40)
LDL Cholesterol: 191 mg/dL — ABNORMAL HIGH (ref 0–99)
TRIGLYCERIDES: 147 mg/dL (ref <150)
Total CHOL/HDL Ratio: 6.5 RATIO
VLDL: 29 mg/dL (ref 0–40)

## 2014-11-17 LAB — CBC
HCT: 41.3 % (ref 40.0–52.0)
Hemoglobin: 13.3 g/dL (ref 13.0–18.0)
MCH: 29 pg (ref 26.0–34.0)
MCHC: 32.3 g/dL (ref 32.0–36.0)
MCV: 90 fL (ref 80.0–100.0)
PLATELETS: 246 10*3/uL (ref 150–440)
RBC: 4.59 MIL/uL (ref 4.40–5.90)
RDW: 15.2 % — AB (ref 11.5–14.5)
WBC: 6.7 10*3/uL (ref 3.8–10.6)

## 2014-11-17 MED ORDER — AMOXICILLIN 500 MG PO TABS: 500.0000 mg | ORAL_TABLET | Freq: Two times a day (BID) | ORAL | Status: DC

## 2014-11-17 MED ORDER — APIXABAN 5 MG PO TABS: 5.0000 mg | ORAL_TABLET | Freq: Two times a day (BID) | ORAL | Status: DC

## 2014-11-17 NOTE — Discharge Instructions (Signed)
°  DIET:  Cardiac diet  DISCHARGE CONDITION:  Stable  ACTIVITY:  Activity as tolerated  OXYGEN:  Home Oxygen: No.   Oxygen Delivery: room air  DISCHARGE LOCATION:  home    ADDITIONAL DISCHARGE INSTRUCTION: make sure to take medication as prescribed   If you experience worsening of your admission symptoms, develop shortness of breath, life threatening emergency, suicidal or homicidal thoughts you must seek medical attention immediately by calling 911 or calling your MD immediately  if symptoms less severe.  You Must read complete instructions/literature along with all the possible adverse reactions/side effects for all the Medicines you take and that have been prescribed to you. Take any new Medicines after you have completely understood and accpet all the possible adverse reactions/side effects.   Please note  You were cared for by a hospitalist during your hospital stay. If you have any questions about your discharge medications or the care you received while you were in the hospital after you are discharged, you can call the unit and asked to speak with the hospitalist on call if the hospitalist that took care of you is not available. Once you are discharged, your primary care physician will handle any further medical issues. Please note that NO REFILLS for any discharge medications will be authorized once you are discharged, as it is imperative that you return to your primary care physician (or establish a relationship with a primary care physician if you do not have one) for your aftercare needs so that they can reassess your need for medications and monitor your lab values.

## 2014-11-17 NOTE — Discharge Summary (Signed)
Darren Rogers, 59 y.o., DOB [birth year], MRN [id number]. Admission date: 11/16/2014 Discharge Date 11/17/2014 Primary MD No PCP Per Patient Admitting Physician Alford Highland, MD  Admission Diagnosis   :  Left upper extremity weakness  Discharge Diagnosis    TIA History of paroxysmal A. Fib Hypertension Hyperlipidemia Tobacco abuse Coronary artery disease       Hospital Course  Patient is a 59 year old African-American male with history of paroxysmal atrial fibrillation who was on Xarelto at home but has not taken this for months due to the fear of erectile dysfunction according to him. Patient came to the hospital with complaint of left-sided weakness CT scan of the head was done which was negative. There was high suspicion for a CVA. He had MRI of the brain which was negative. Patient was seen by neurology and with his history of atrial fibrillation he strongly recommended the patient be on anticoagulation. He was switched to Eliquis. Patient had carotid Dopplers which did show some plaque but no significant stenosis. Patient also had an echocardiogram of the heart ordered but he stated that he had this recently and did not want to stay to have this done. I strongly recommended that he have this done. But he is not willing to wait for that. He was also offered outpatient physical therapy but states that he is not interested in outpatient physical therapy due to him him working.             Consults  neurology  Significant Tests:  See full reports for all details    Ct Head Wo Contrast  11/16/2014   CLINICAL DATA:  LEFT-sided weakness since 11/14/2014. No other symptoms.  EXAM: CT HEAD WITHOUT CONTRAST  TECHNIQUE: Contiguous axial images were obtained from the base of the skull through the vertex without intravenous contrast.  COMPARISON:  None.  FINDINGS: Subtle asymmetric hypoattenuation in the region of the RIGHT posterior limb internal capsule, possibly 1 cm in diameter,  could represent developing acute infarction. If no contraindications, MRI could better assess.  No cortical hypodensity, hemorrhage, mass lesion, hydrocephalus, or extra-axial fluid.  Slight premature for age atrophy. Possible early chronic microvascular ischemic change. Mild vascular calcification in the carotid, LEFT vertebral, and basilar arteries. No features suggestive of large vessel occlusion. No osseous lesions. Sinuses and mastoids are clear.  IMPRESSION: Cannot exclude a deep white matter infarct affecting the RIGHT posterior limb internal capsule. Correlate clinically for LEFT-sided weakness. If no contraindications, MRI brain without contrast recommended.   Electronically Signed   By: Elsie Stain M.D.   On: 11/16/2014 10:21   Mr Brain Wo Contrast  11/16/2014   CLINICAL DATA:  Left-sided weakness since 2 days ago.  EXAM: MRI HEAD WITHOUT CONTRAST  TECHNIQUE: Multiplanar, multiecho pulse sequences of the brain and surrounding structures were obtained without intravenous contrast.  COMPARISON:  CT same day  FINDINGS: Diffusion imaging does not show any acute or subacute infarction. The brainstem and cerebellum are normal. The cerebral hemispheres are normal. No evidence of old or acute small or large vessel infarction. No mass lesion, hemorrhage, hydrocephalus or extra-axial collection. No pituitary mass. 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 Major vessels at the base of the brain show flow.  IMPRESSION: Normal appearance of the brain itself.  Left maxillary sinusitis.   Electronically Signed   By: Paulina Fusi M.D.   On: 11/16/2014 15:26   US Carotid Bilateral  11/16/2014   CLINICAL DATA:  59 year old male with symptoms of transient  ischemic attack  EXAM: BILATERAL CAROTID DUPLEX ULTRASOUND  TECHNIQUE: Wallace Cullens scale imaging, color Doppler and duplex ultrasound were performed of bilateral carotid and vertebral arteries in the neck.  COMPARISON:  Brain MRI 11/16/2014  FINDINGS:  Criteria: Quantification of carotid stenosis is based on velocity parameters that correlate the residual internal carotid diameter with NASCET-based stenosis levels, using the diameter of the distal internal carotid lumen as the denominator for stenosis measurement.  The following velocity measurements were obtained:  RIGHT  ICA:  70/29 cm/sec  CCA:  88/18 cm/sec  SYSTOLIC ICA/CCA RATIO:  0.8  DIASTOLIC ICA/CCA RATIO:  1.6  ECA:  86 cm/sec  LEFT  ICA:  95/48 cm/sec  CCA:  84/16 cm/sec  SYSTOLIC ICA/CCA RATIO:  1.1  DIASTOLIC ICA/CCA RATIO:  2.9  ECA:  87 cm/sec  RIGHT CAROTID ARTERY: Trace heterogeneous atherosclerotic plaque in the carotid bifurcation without evidence of stenosis.  RIGHT VERTEBRAL ARTERY:  Pain with normal antegrade flow.  LEFT CAROTID ARTERY: Trace heterogeneous atherosclerotic plaque in the carotid bifurcation without evidence of stenosis.  LEFT VERTEBRAL ARTERY:  Patent with normal antegrade flow.  IMPRESSION: Trace heterogeneous atherosclerotic plaque in the bilateral carotid bifurcations without evidence of stenosis.  Vertebral arteries are patent with normal antegrade flow.  Signed,  Sterling Big, MD  [facility]  [facility]   Electronically Signed   By: Malachy Moan M.D.   On: 11/16/2014 16:53       Today   Subjective:   Darren Rogers  feels better left upper extremity weakness is mostly resolved  Objective:   Blood pressure 136/73, pulse 75, temperature 98.8 F (37.1 C), temperature source Oral, resp. rate 16, height  (1.676 m), weight 71.85 kg (158 lb 6.4 oz), SpO2 100 %.  .  Intake/Output Summary (Last 24 hours) at 11/17/14 1039 Last data filed at 11/17/14 0830  Gross per 24 hour  Intake    360 ml  Output    400 ml  Net    -40 ml    Exam VITAL SIGNS: Blood pressure 136/73, pulse 75, temperature 98.8 F (37.1 C), temperature source Oral, resp. rate 16, height  (1.676 m), weight 71.85 kg (158 lb  6.4 oz), SpO2 100 %.  GENERAL:  59 y.o.-year-old patient lying in the bed with no acute distress.  EYES: Pupils equal, round, reactive to light and accommodation. No scleral icterus. Extraocular muscles intact.  HEENT: Head atraumatic, normocephalic. Oropharynx and nasopharynx clear.  NECK:  Supple, no jugular venous distention. No thyroid enlargement, no tenderness.  LUNGS: Normal breath sounds bilaterally, no wheezing, rales,rhonchi or crepitation. No use of accessory muscles of respiration.  CARDIOVASCULAR: S1, S2 normal. No murmurs, rubs, or gallops.  ABDOMEN: Soft, nontender, nondistended. Bowel sounds present. No organomegaly or mass.  EXTREMITIES: No pedal edema, cyanosis, or clubbing.  NEUROLOGIC: Cranial nerves II through XII are intact. Muscle strength 5/5 in all extremities. Sensation intact. Gait not checked.  PSYCHIATRIC: The patient is alert and oriented x 3.  SKIN: No obvious rash, lesion, or ulcer.   Data Review     CBC w Diff: Lab Results  Component Value Date   WBC 6.7 11/17/2014   WBC 9.0 09/21/2014   HGB 13.3 11/17/2014   HGB 14.3 09/21/2014   HCT 41.3 11/17/2014   HCT 43.3 09/21/2014   PLT 246 11/17/2014   PLT 286 09/21/2014   LYMPHOPCT 33 11/16/2014   LYMPHOPCT 53.5 09/19/2014   MONOPCT 11 11/16/2014   MONOPCT  8.3 09/19/2014   EOSPCT 3 11/16/2014   EOSPCT 1.9 09/19/2014   BASOPCT 1 11/16/2014   BASOPCT 0.8 09/19/2014   CMP: Lab Results  Component Value Date   NA 140 11/17/2014   NA 139 09/21/2014   K 4.0 11/17/2014   K 3.5 09/21/2014   CL 109 11/17/2014   CL 109 09/21/2014   CO2 23 11/17/2014   CO2 23 09/21/2014   BUN 21* 11/17/2014   BUN 15 09/21/2014   CREATININE 1.08 11/17/2014   CREATININE 1.10 09/21/2014   PROT 6.9 11/16/2014   PROT 6.9 05/12/2014   ALBUMIN 3.8 11/16/2014   ALBUMIN 3.4 05/12/2014   BILITOT 0.4 11/16/2014   ALKPHOS 71 11/16/2014   ALKPHOS 86 05/12/2014   AST 20 11/16/2014   AST 21 05/12/2014   ALT 15* 11/16/2014    ALT 24 05/12/2014  .  Micro Results No results found for this or any previous visit (from the past 240 hour(s)).      Code Status Orders        Start     Ordered   11/16/14 1212  Full code   Continuous     11/16/14 1212          Follow-up Information    Follow up with pcp In 7 days.      Discharge Medications     Medication List    STOP taking these medications        rivaroxaban 20 MG Tabs tablet  Commonly known as:  XARELTO      TAKE these medications        amoxicillin 500 MG tablet  Commonly known as:  AMOXIL  Take 1 tablet (500 mg total) by mouth 2 (two) times daily.     apixaban 5 MG Tabs tablet  Commonly known as:  ELIQUIS  Take 1 tablet (5 mg total) by mouth 2 (two) times daily.     aspirin EC 81 MG tablet  Take 81 mg by mouth daily.     atorvastatin 40 MG tablet  Commonly known as:  LIPITOR  Take 1 tablet (40 mg total) by mouth daily.     diltiazem 180 MG 24 hr capsule  Commonly known as:  CARDIZEM CD  Take 1 capsule (180 mg total) by mouth daily.     metoprolol 50 MG tablet  Commonly known as:  LOPRESSOR  TAKE ONE TABLET TWICE DAILY AS NEEDED     multivitamin tablet  Take 1 tablet by mouth daily.           Total Time in preparing paper work, data evaluation and todays exam - 35 minutes  Auburn Bilberry M.D on 11/17/2014 at 10:39 AM  [facility]   Office  [phone]

## 2014-11-17 NOTE — Progress Notes (Signed)
Pt to be discharged today. Iv and tele removed. disch instructions with handouts given to pt to his understanding. Discharged to home accompanied by friend.

## 2014-12-03 ENCOUNTER — Encounter: Payer: Self-pay | Admitting: *Deleted

## 2014-12-03 ENCOUNTER — Emergency Department
Admission: EM | Admit: 2014-12-03 | Discharge: 2014-12-03 | Disposition: A | Discharge status: Home / Self Care | Payer: 59 | Attending: Emergency Medicine | Admitting: Emergency Medicine

## 2014-12-03 DIAGNOSIS — Z79899 Other long term (current) drug therapy: Secondary | ICD-10-CM | POA: Insufficient documentation

## 2014-12-03 DIAGNOSIS — M542 Cervicalgia: Secondary | ICD-10-CM | POA: Diagnosis present

## 2014-12-03 DIAGNOSIS — I10 Essential (primary) hypertension: Secondary | ICD-10-CM | POA: Diagnosis not present

## 2014-12-03 DIAGNOSIS — Z720 Tobacco use: Secondary | ICD-10-CM | POA: Insufficient documentation

## 2014-12-03 DIAGNOSIS — K115 Sialolithiasis: Secondary | ICD-10-CM | POA: Insufficient documentation

## 2014-12-03 MED ORDER — OXYCODONE-ACETAMINOPHEN 5-325 MG PO TABS
1.0000 | ORAL_TABLET | Freq: Once | ORAL | Status: AC
  Administered 2014-12-03: 1 via ORAL

## 2014-12-03 MED ORDER — OXYCODONE-ACETAMINOPHEN 5-325 MG PO TABS: 1.0000 | ORAL_TABLET | Freq: Three times a day (TID) | ORAL | Status: DC | PRN

## 2014-12-03 MED ORDER — AMOXICILLIN-POT CLAVULANATE 875-125 MG PO TABS: 1.0000 | ORAL_TABLET | Freq: Two times a day (BID) | ORAL | Status: AC

## 2014-12-03 MED ORDER — OXYCODONE-ACETAMINOPHEN 5-325 MG PO TABS
ORAL_TABLET | ORAL | Status: AC
  Administered 2014-12-03: 1 via ORAL
  Filled 2014-12-03: qty 1

## 2014-12-03 NOTE — Discharge Instructions (Signed)
Take medication as prescribed. Monitor area. Drink plenty of water. Suck on hard candies like lemon candies.  Follow up closely with her primary care physician this week. Follow-up in 2-3 days.  Return to the ER for increased pain, swelling, redness, fever, difficulty eating or swallowing., New or worsening concerns.  Salivary Stone Your exam shows you have a stone in one of your saliva glands. These small stones form around a mucous plug in the ducts of the glands and cause the saliva in the gland to be blocked. This makes the gland swollen and painful, especially when you eat. If repeated episodes occur, the gland can become infected. Sometimes these stones can be seen on x-ray. Treatment includes stimulating the production of saliva to push the stone out. You should suck on a lemon or sour candies several times daily. Antibiotic medicine may be needed if the gland is infected. Increasing fluids, applying warm compresses to the swollen area 3-4 times daily, and massaging the gland from back to front may encourage drainage and passage of the stone. Surgical treatment to remove the stone is sometimes necessary, so proper medical follow up is very important. Call your doctor for an appointment as recommended. Call right away if you have a high fever, severe headache, vomiting, uncontrolled pain, or other serious symptoms. Document Released: 06/25/2004 Document Revised: 08/10/2011 Document Reviewed: 05/18/2005 [facility] Patient Information 2015 [place], [place]. This information is not intended to replace advice given to you by your health care provider. Make sure you discuss any questions you have with your health care provider.

## 2014-12-03 NOTE — ED Provider Notes (Signed)
[facility] Emergency Department Provider Note  ____________________________________________  Time seen: Approximately 1:46 PM  I have reviewed the triage vital signs and the nursing notes.   HISTORY  Chief Complaint Neck Pain   HPI Darren Rogers is a 59 y.o. male presents to the ER for complaints of right neck pain below right jaw. Patient states that he has had pain in this area for one week. She reports history of similar but states that it resolved after a few days. States no previous treatment to make pain resolve .Patient states that pain is present at all times and increases with with eating as well as thinking of eating. Also reports pain increases with movement. Patient states pain stays in the same area and does not radiate. Also reports pain increases with palpation to that area.patient reports noticing intermittent swelling that comes and goes. States no swelling at this time.  Denies fever, posterior neck pain, difficulty swallowing, recent sickness, congestion, chest pain or shortness of breath.   Patient reports that he was recently admitted the hospital to 3 weeks ago for right arm weakness and states was admitted due to concerns for CVA. Patient states that no stroke was found patient states that while in the hospital he was put on oral amoxicillin due to left upper dental pain and swelling and states finish amoxicillin prescription last week. Denies dental pain. Denies changes in oral intake. Reports continues to eat and drink well.denies weakness, weakness in arms or legs or other complaints.   Past Medical History  Diagnosis Date  . Tobacco abuse     a. ongoing  . Paroxysmal atrial fibrillation     a. s/p ablation procedure ~ 20 yrs ago; b. very symptomatic when goes into a-fib w/ RVR  . Hypertension   . Hyperlipidemia   . History of cardiac cath     a. cath 01/27/2014: LM widely patent, LAD mild diff dz w/o obs, LCx no sig obs - scattered  20-30% mLCx, RCA no obs dz, EF 70%  . History of echocardiogram     a. 03/29/2014: EF 55-60%, mild LVH, normal RVSP  . CAD (coronary artery disease) 04/11/2014   Recently admitted to the hospital to rule out CVA. MRI during hospitalization negative for CVA. Bilateral carotid ultrasound completed during hospitalization which showed bilateral plaque without evidence of stenosis.     Patient Active Problem List   Diagnosis Date Noted  . CVA (cerebral infarction) 11/16/2014  . CAD in native artery 04/11/2014  . History of echocardiogram   . Paroxysmal atrial fibrillation   . STEMI (ST elevation myocardial infarction) 01/29/2014  . Tobacco abuse   . Hyperlipidemia   . Hypertension   . Atrial fibrillation with rapid ventricular response 01/27/2014    Past Surgical History  Procedure Laterality Date  . Cardiac catheterization    . Cardiac catheterization      [facility]  . Left heart cath Right 01/27/2014    Procedure: LEFT HEART CATH;  Surgeon: Micheline Chapman, MD;  Location: [facility] CATH LAB;  Service: Cardiovascular;  Laterality: Right;    Current Outpatient Rx  Name  Route  Sig  Dispense  Refill  . amoxicillin (AMOXIL) 500 MG tablet   Oral   Take 1 tablet (500 mg total) by mouth 2 (two) times daily.   28 tablet   0   . amoxicillin-clavulanate (AUGMENTIN) 875-125 MG per tablet   Oral   Take 1 tablet by mouth every 12 (twelve) hours.  20 tablet   0   . apixaban (ELIQUIS) 5 MG TABS tablet   Oral   Take 1 tablet (5 mg total) by mouth 2 (two) times daily.   60 tablet   3   . aspirin EC 81 MG tablet   Oral   Take 81 mg by mouth daily.         Marland Kitchen atorvastatin (LIPITOR) 40 MG tablet   Oral   Take 1 tablet (40 mg total) by mouth daily. Patient not taking: Reported on 11/16/2014   90 tablet   3   . diltiazem (CARDIZEM CD) 180 MG 24 hr capsule   Oral   Take 1 capsule (180 mg total) by mouth daily.   30 capsule   11   . metoprolol (LOPRESSOR) 50 MG tablet      TAKE ONE  TABLET TWICE DAILY AS NEEDED   30 tablet   3   . Multiple Vitamin (MULTIVITAMIN) tablet   Oral   Take 1 tablet by mouth daily.         Marland Kitchen oxyCODONE-acetaminophen (ROXICET) 5-325 MG per tablet   Oral   Take 1 tablet by mouth every 8 (eight) hours as needed for moderate pain or severe pain (Do not drive or operate heavy machinery while taking as can cause drowsiness.).   10 tablet   0     Allergies Review of patient's allergies indicates no known allergies.  Family History  Problem Relation Age of Onset  . Coronary artery disease Father 42    Social History History  Substance Use Topics  . Smoking status: Current Every Day Smoker -- 1.00 packs/day for 30 years    Types: Cigarettes  . Smokeless tobacco: Never Used  . Alcohol Use: No    Review of Systems Constitutional: No fever/chills Eyes: No visual changes. ENT: No sore throat.positive for right neck pain. Cardiovascular: Denies chest pain. Respiratory: Denies shortness of breath. Gastrointestinal: No abdominal pain.  No nausea, no vomiting.  No diarrhea.  No constipation. Genitourinary: Negative for dysuria. Musculoskeletal: Negative for back pain. Skin: Negative for rash. Neurological: Negative for headaches, focal weakness or numbness.  10-point ROS otherwise negative.  ____________________________________________   PHYSICAL EXAM:  VITAL SIGNS: ED Triage Vitals  Enc Vitals Group     BP 12/03/14 1212 122/70 mmHg     Pulse Rate 12/03/14 1212 51     Resp 12/03/14 1212 20     Temp 12/03/14 1212 98.6 F (37 C)     Temp Source 12/03/14 1212 Oral     SpO2 12/03/14 1212 99 %     Weight 12/03/14 1212 168 lb (76.204 kg)     Height 12/03/14 1212 5\' 6"  (1.676 m)     Head Cir --      Peak Flow --      Pain Score 12/03/14 1213 5     Pain Loc --      Pain Edu? --      Excl. in GC? --     Constitutional: Alert and oriented. Well appearing and in no acute distress. Eyes: Conjunctivae are normal. PERRL.  EOMI. Head: Atraumatic. Ears: no erythema, normal TMs.  Nose: No congestion/rhinnorhea. Mouth/Throat: Mucous membranes are moist.  Oropharynx non-erythematous. No tonsillar swelling. No uvular shift or deviation.widespread dental decay. No palpable dental tenderness. No visualized or palpable dental abscess. Neck: No stridor.  No cervical spine tenderness to palpation.no posterior cervical spine tenderness. No left-sided neck tenderness.moderate tender to palpation right submandibular area.  No carotid bruit auscultated. 2+ bilateral carotid pulses. No erythema or swelling. Hematological/Lymphatic/Immunilogical: No cervical lymphadenopathy. Cardiovascular: Normal rate, regular rhythm. Grossly normal heart sounds.  Good peripheral circulation. Respiratory: Normal respiratory effort.  No retractions. Lungs CTAB. Gastrointestinal: Soft and nontender. No distention. No abdominal bruits. No CVA tenderness. Musculoskeletal: No lower extremity tenderness nor edema.  No joint effusions.no thoracic or lumbar tenderness to palpation.steady gait. Changes positions quickly without difficulty or distress. Neurologic:  Normal speech and language. No gross focal neurologic deficits are appreciated. Speech is normal. No gait instability. Skin:  Skin is warm, dry and intact. No rash noted. Psychiatric: Mood and affect are normal. Speech and behavior are normal.  ____________________________________________   LABS (all labs ordered are listed, but only abnormal results are displayed)  Labs Reviewed - No data to display ____________________________________________    INITIAL IMPRESSION / ASSESSMENT AND PLAN / ED COURSE  Pertinent labs & imaging results that were available during my care of the patient were reviewed by me and considered in my medical decision making (see chart for details).  Discussed patient and planning care with Dr. Lenard Lance who also saw and examined patient. In discussion with Dr  Lenard Lance, suspect pain related to salivary sialoadenitis as pain is is located directly in the region of right submandibular salivary gland versus anterior cervical lymphadenitis..  Very well-appearing patient. No acute distress. Presents the ER for complaints of right submandibular neck pain present 1 week with history of similar. Reports nontraumatic, denies fever, denies appetite changes or other complaints. Denies pain radiation. Continues to eat and drink well  Discussed in detail with patient and spouse regarding further evaluation. Discussed evaluation by CT. Patient verbalized that he does not want a CT or lab work at this time. Patient refused CT and lab work. Patient alert and oriented with decisional capacity.Discussed importance of close follow-up and strict return parameters. We will treat patient with oral antibiotics Augmentin as well as when necessary pain medication as concerned for sialoadenitis. Counseled using hard sour candies lozenges to promote ductal secretions. Discussed follow-up with the primary care physician 2-3 days.patient and spouse verbalized understanding and agreed to plan. ____________________________________________   FINAL CLINICAL IMPRESSION(S) / ED DIAGNOSES  Final diagnoses:  Salivary stone and right submandibular sialoadenitis  Right neck pain      Renford Dills, NP 12/03/14 1626  Minna Antis, MD 12/03/14 2238

## 2014-12-03 NOTE — ED Notes (Signed)
Pt is riding home with his significant other

## 2014-12-03 NOTE — ED Notes (Signed)
Has pain in right neck for a week, has some pain around right ear, hx same in the paist

## 2014-12-03 NOTE — ED Provider Notes (Signed)
-----------------------------------------   2:28 PM on 12/03/2014 -----------------------------------------  Patient seen and evaluated by myself. Patient having complaints of right-sided neck pain times one week. Of note the patient was recently admitted to the hospital approximately 2-3 weeks ago for right arm symptoms, and concern for CVA. Ultimately CVA was ruled out with an MRI. Patient also notes recent dental infection on the left upper jaw, recently finished a course of amoxicillin for this. Has no dental complaints today. Patient's pain today is to the right neck/submandibular region. No bruit auscultated, 2+ carotid pulses. Patient denies fevers. States the pain is worse when turning his head. Patient has moderate tenderness to palpation over this area. The area of concern is in the region of the right submandibular salivary gland. I discussed the options with the patient as far as empiric treatment with Augmentin, and a mild amount of pain medication, versus CT scan to evaluate for abscess. Given his tenderness, and location of pain I believe the 2 most likely culprits for his symptoms or sialoadenitis versus anterior cervical lymphadenitis. No tenderness to tracheal rocking, do not suspect retropharyngeal abscess, do not suspect PTA. Patient has no sore throat, with a normal pharyngeal exam. Patient has no dental pain, and no sign of dental abscess on exam. Patient has opted to avoid a CT scan at this time. We'll place the patient on Augmentin, Norco as needed for pain control and the patient is to follow-up with his primary care doctor. I discussed with the patient and his significant other very strict return precautions including fever, worsening pain or any appreciable swelling, the patient is to return to the emergency department for CT scan if these were to occur. Patient is agreeable to plan and we'll discharge the patient home at this time.  Minna Antis, MD 12/03/14 [phone]

## 2014-12-03 NOTE — ED Notes (Signed)
Having pain to right side of neck for several days.feels a hard area under right ear

## 2014-12-05 ENCOUNTER — Telehealth: Payer: Self-pay

## 2014-12-05 NOTE — Telephone Encounter (Signed)
l mom to schedule ED f/u. Pt has seen Dr. Kirke Corin in 2015

## 2014-12-11 ENCOUNTER — Telehealth: Payer: Self-pay | Admitting: Cardiovascular Disease

## 2014-12-11 NOTE — Telephone Encounter (Signed)
Patient called for metoprolol refill but pharmacy has 2 refills for him.  Patient is OD for appt.  Scheduled with Kirke Corin for August.

## 2015-01-10 ENCOUNTER — Emergency Department
Admission: EM | Admit: 2015-01-10 | Discharge: 2015-01-10 | Disposition: A | Discharge status: Home / Self Care | Payer: 59 | Attending: Emergency Medicine | Admitting: Emergency Medicine

## 2015-01-10 ENCOUNTER — Emergency Department: Payer: 59

## 2015-01-10 ENCOUNTER — Encounter: Payer: Self-pay | Admitting: Emergency Medicine

## 2015-01-10 DIAGNOSIS — I10 Essential (primary) hypertension: Secondary | ICD-10-CM | POA: Diagnosis not present

## 2015-01-10 DIAGNOSIS — R079 Chest pain, unspecified: Secondary | ICD-10-CM | POA: Diagnosis present

## 2015-01-10 DIAGNOSIS — Z7982 Long term (current) use of aspirin: Secondary | ICD-10-CM | POA: Insufficient documentation

## 2015-01-10 DIAGNOSIS — Z720 Tobacco use: Secondary | ICD-10-CM | POA: Diagnosis not present

## 2015-01-10 DIAGNOSIS — Z79899 Other long term (current) drug therapy: Secondary | ICD-10-CM | POA: Insufficient documentation

## 2015-01-10 DIAGNOSIS — Z9889 Other specified postprocedural states: Secondary | ICD-10-CM | POA: Diagnosis not present

## 2015-01-10 DIAGNOSIS — I480 Paroxysmal atrial fibrillation: Secondary | ICD-10-CM | POA: Insufficient documentation

## 2015-01-10 LAB — BASIC METABOLIC PANEL
ANION GAP: 5 (ref 5–15)
BUN: 18 mg/dL (ref 6–20)
CALCIUM: 8.8 mg/dL — AB (ref 8.9–10.3)
CHLORIDE: 107 mmol/L (ref 101–111)
CO2: 25 mmol/L (ref 22–32)
Creatinine, Ser: 1.13 mg/dL (ref 0.61–1.24)
GFR calc Af Amer: >60 mL/min (ref >60)
GFR calc non Af Amer: >60 mL/min (ref >60)
Glucose, Bld: 102 mg/dL — ABNORMAL HIGH (ref 65–99)
POTASSIUM: 4.1 mmol/L (ref 3.5–5.1)
SODIUM: 137 mmol/L (ref 135–145)

## 2015-01-10 LAB — URINE DRUG SCREEN, QUALITATIVE (ARMC ONLY)
AMPHETAMINES, UR SCREEN: NOT DETECTED
Barbiturates, Ur Screen: NOT DETECTED
Benzodiazepine, Ur Scrn: NOT DETECTED
CANNABINOID 50 NG, UR ~~LOC~~: NOT DETECTED
Cocaine Metabolite,Ur ~~LOC~~: NOT DETECTED
MDMA (Ecstasy)Ur Screen: NOT DETECTED
Methadone Scn, Ur: NOT DETECTED
Opiate, Ur Screen: NOT DETECTED
Phencyclidine (PCP) Ur S: NOT DETECTED
Tricyclic, Ur Screen: NOT DETECTED

## 2015-01-10 LAB — CBC
HCT: 43 % (ref 40.0–52.0)
Hemoglobin: 14.2 g/dL (ref 13.0–18.0)
MCH: 29.8 pg (ref 26.0–34.0)
MCHC: 33.1 g/dL (ref 32.0–36.0)
MCV: 89.9 fL (ref 80.0–100.0)
Platelets: 233 10*3/uL (ref 150–440)
RBC: 4.78 MIL/uL (ref 4.40–5.90)
RDW: 15.5 % — AB (ref 11.5–14.5)
WBC: 7.4 10*3/uL (ref 3.8–10.6)

## 2015-01-10 LAB — PROTIME-INR
INR: 0.89
Prothrombin Time: 12.2 seconds (ref 11.4–15.0)

## 2015-01-10 LAB — TROPONIN I: Troponin I: <0.03 ng/mL (ref <0.031)

## 2015-01-10 LAB — BRAIN NATRIURETIC PEPTIDE: B Natriuretic Peptide: 50 pg/mL (ref 0.0–100.0)

## 2015-01-10 LAB — ETHANOL

## 2015-01-10 MED ORDER — ASPIRIN 81 MG PO CHEW
324.0000 mg | CHEWABLE_TABLET | Freq: Once | ORAL | Status: AC
  Administered 2015-01-10: 324 mg via ORAL
  Filled 2015-01-10: qty 4

## 2015-01-10 MED ORDER — DILTIAZEM HCL 25 MG/5ML IV SOLN
10.0000 mg | Freq: Once | INTRAVENOUS | Status: AC
  Administered 2015-01-10: 10 mg via INTRAVENOUS

## 2015-01-10 MED ORDER — DILTIAZEM HCL ER COATED BEADS 180 MG PO CP24
180.0000 mg | ORAL_CAPSULE | Freq: Once | ORAL | Status: AC
  Administered 2015-01-10: 180 mg via ORAL
  Filled 2015-01-10: qty 1

## 2015-01-10 MED ORDER — DILTIAZEM HCL 25 MG/5ML IV SOLN
10.0000 mg | Freq: Once | INTRAVENOUS | Status: AC
  Administered 2015-01-10: 10 mg via INTRAVENOUS
  Filled 2015-01-10: qty 5

## 2015-01-10 NOTE — Discharge Instructions (Signed)
Your records indicate that you were to stop your metoprolol but continue taking diltiazem. Please call your cardiologist today to clarify which medication you should be taking. Return to the ER for worsening or recurrent symptoms, persistent vomiting, difficulty breathing or other concerns.  Atrial Fibrillation Atrial fibrillation is a type of irregular heart rhythm (arrhythmia). During atrial fibrillation, the upper chambers of the heart (atria) quiver continuously in a chaotic pattern. This causes an irregular and often rapid heart rate.  Atrial fibrillation is the result of the heart becoming overloaded with disorganized signals that tell it to beat. These signals are normally released one at a time by a part of the right atrium called the sinoatrial node. They then travel from the atria to the lower chambers of the heart (ventricles), causing the atria and ventricles to contract and pump blood as they pass. In atrial fibrillation, parts of the atria outside of the sinoatrial node also release these signals. This results in two problems. First, the atria receive so many signals that they do not have time to fully contract. Second, the ventricles, which can only receive one signal at a time, beat irregularly and out of rhythm with the atria.  There are three types of atrial fibrillation:   Paroxysmal. Paroxysmal atrial fibrillation starts suddenly and stops on its own within a week.  Persistent. Persistent atrial fibrillation lasts for more than a week. It may stop on its own or with treatment.  Permanent. Permanent atrial fibrillation does not go away. Episodes of atrial fibrillation may lead to permanent atrial fibrillation. Atrial fibrillation can prevent your heart from pumping blood normally. It increases your risk of stroke and can lead to heart failure.  CAUSES   Heart conditions, including a heart attack, heart failure, coronary artery disease, and heart valve conditions.   Inflammation  of the sac that surrounds the heart (pericarditis).  Blockage of an artery in the lungs (pulmonary embolism).  Pneumonia or other infections.  Chronic lung disease.  Thyroid problems, especially if the thyroid is overactive (hyperthyroidism).  Caffeine, excessive alcohol use, and use of some illegal drugs.   Use of some medicines, including certain decongestants and diet pills.  Heart surgery.   Birth defects.  Sometimes, no cause can be found. When this happens, the atrial fibrillation is called lone atrial fibrillation. The risk of complications from atrial fibrillation increases if you have lone atrial fibrillation and you are age 88 years or older. RISK FACTORS  Heart failure.  Coronary artery disease.  Diabetes mellitus.   High blood pressure (hypertension).   Obesity.   Other arrhythmias.   Increased age. SIGNS AND SYMPTOMS   A feeling that your heart is beating rapidly or irregularly.   A feeling of discomfort or pain in your chest.   Shortness of breath.   Sudden light-headedness or weakness.   Getting tired easily when exercising.   Urinating more often than normal (mainly when atrial fibrillation first begins).  In paroxysmal atrial fibrillation, symptoms may start and suddenly stop. DIAGNOSIS  Your health care provider may be able to detect atrial fibrillation when taking your pulse. Your health care provider may have you take a test called an ambulatory electrocardiogram (ECG). An ECG records your heartbeat patterns over a 24-hour period. You may also have other tests, such as:  Transthoracic echocardiogram (TTE). During echocardiography, sound waves are used to evaluate how blood flows through your heart.  Transesophageal echocardiogram (TEE).  Stress test. There is more than one type of stress  test. If a stress test is needed, ask your health care provider about which type is best for you.  Chest X-ray exam.  Blood  tests.  Computed tomography (CT). TREATMENT  Treatment may include:  Treating any underlying conditions. For example, if you have an overactive thyroid, treating the condition may correct atrial fibrillation.  Taking medicine. Medicines may be given to control a rapid heart rate or to prevent blood clots, heart failure, or a stroke.  Having a procedure to correct the rhythm of the heart:  Electrical cardioversion. During electrical cardioversion, a controlled, low-energy shock is delivered to the heart through your skin. If you have chest pain, very low blood pressure, or sudden heart failure, this procedure may need to be done as an emergency.  Catheter ablation. During this procedure, heart tissues that send the signals that cause atrial fibrillation are destroyed.  Surgical ablation. During this surgery, thin lines of heart tissue that carry the abnormal signals are destroyed. This procedure can either be an open-heart surgery or a minimally invasive surgery. With the minimally invasive surgery, small cuts are made to access the heart instead of a large opening.  Pulmonary venous isolation. During this surgery, tissue around the veins that carry blood from the lungs (pulmonary veins) is destroyed. This tissue is thought to carry the abnormal signals. HOME CARE INSTRUCTIONS   Take medicines only as directed by your health care provider. Some medicines can make atrial fibrillation worse or recur.  If blood thinners were prescribed by your health care provider, take them exactly as directed. Too much blood-thinning medicine can cause bleeding. If you take too little, you will not have the needed protection against stroke and other problems.  Perform blood tests at home if directed by your health care provider. Perform blood tests exactly as directed.  Quit smoking if you smoke.  Do not drink alcohol.  Do not drink caffeinated beverages such as coffee, soda, and some teas. You may drink  decaffeinated coffee, soda, or tea.   Maintain a healthy weight.Do not use diet pills unless your health care provider approves. They may make heart problems worse.   Follow diet instructions as directed by your health care provider.  Exercise regularly as directed by your health care provider.  Keep all follow-up visits as directed by your health care provider. This is important. PREVENTION  The following substances can cause atrial fibrillation to recur:   Caffeinated beverages.  Alcohol.  Certain medicines, especially those used for breathing problems.  Certain herbs and herbal medicines, such as those containing ephedra or ginseng.  Illegal drugs, such as cocaine and amphetamines. Sometimes medicines are given to prevent atrial fibrillation from recurring. Proper treatment of any underlying condition is also important in helping prevent recurrence.  SEEK MEDICAL CARE IF:  You notice a change in the rate, rhythm, or strength of your heartbeat.  You suddenly begin urinating more frequently.  You tire more easily when exerting yourself or exercising. SEEK IMMEDIATE MEDICAL CARE IF:   You have chest pain, abdominal pain, sweating, or weakness.  You feel nauseous.  You have shortness of breath.  You suddenly have swollen feet and ankles.  You feel dizzy.  Your face or limbs feel numb or weak.  You have a change in your vision or speech. MAKE SURE YOU:   Understand these instructions.  Will watch your condition.  Will get help right away if you are not doing well or get worse. Document Released: 05/18/2005 Document Revised: 10/02/2013  Document Reviewed: 06/28/2012 [facility] Patient Information 2015 [place], [place]. This information is not intended to replace advice given to you by your health care provider. Make sure you discuss any questions you have with your health care provider.  Chest Pain (Nonspecific) It is often hard to give a specific diagnosis for the  cause of chest pain. There is always a chance that your pain could be related to something serious, such as a heart attack or a blood clot in the lungs. You need to follow up with your health care provider for further evaluation. CAUSES   Heartburn.  Pneumonia or bronchitis.  Anxiety or stress.  Inflammation around your heart (pericarditis) or lung (pleuritis or pleurisy).  A blood clot in the lung.  A collapsed lung (pneumothorax). It can develop suddenly on its own (spontaneous pneumothorax) or from trauma to the chest.  Shingles infection (herpes zoster virus). The chest wall is composed of bones, muscles, and cartilage. Any of these can be the source of the pain.  The bones can be bruised by injury.  The muscles or cartilage can be strained by coughing or overwork.  The cartilage can be affected by inflammation and become sore (costochondritis). DIAGNOSIS  Lab tests or other studies may be needed to find the cause of your pain. Your health care provider may have you take a test called an ambulatory electrocardiogram (ECG). An ECG records your heartbeat patterns over a 24-hour period. You may also have other tests, such as:  Transthoracic echocardiogram (TTE). During echocardiography, sound waves are used to evaluate how blood flows through your heart.  Transesophageal echocardiogram (TEE).  Cardiac monitoring. This allows your health care provider to monitor your heart rate and rhythm in real time.  Holter monitor. This is a portable device that records your heartbeat and can help diagnose heart arrhythmias. It allows your health care provider to track your heart activity for several days, if needed.  Stress tests by exercise or by giving medicine that makes the heart beat faster. TREATMENT   Treatment depends on what may be causing your chest pain. Treatment may include:  Acid blockers for heartburn.  Anti-inflammatory medicine.  Pain medicine for inflammatory  conditions.  Antibiotics if an infection is present.  You may be advised to change lifestyle habits. This includes stopping smoking and avoiding alcohol, caffeine, and chocolate.  You may be advised to keep your head raised (elevated) when sleeping. This reduces the chance of acid going backward from your stomach into your esophagus. Most of the time, nonspecific chest pain will improve within 2-3 days with rest and mild pain medicine.  HOME CARE INSTRUCTIONS   If antibiotics were prescribed, take them as directed. Finish them even if you start to feel better.  For the next few days, avoid physical activities that bring on chest pain. Continue physical activities as directed.  Do not use any tobacco products, including cigarettes, chewing tobacco, or electronic cigarettes.  Avoid drinking alcohol.  Only take medicine as directed by your health care provider.  Follow your health care provider's suggestions for further testing if your chest pain does not go away.  Keep any follow-up appointments you made. If you do not go to an appointment, you could develop lasting (chronic) problems with pain. If there is any problem keeping an appointment, call to reschedule. SEEK MEDICAL CARE IF:   Your chest pain does not go away, even after treatment.  You have a rash with blisters on your chest.  You have a  fever. SEEK IMMEDIATE MEDICAL CARE IF:   You have increased chest pain or pain that spreads to your arm, neck, jaw, back, or abdomen.  You have shortness of breath.  You have an increasing cough, or you cough up blood.  You have severe back or abdominal pain.  You feel nauseous or vomit.  You have severe weakness.  You faint.  You have chills. This is an emergency. Do not wait to see if the pain will go away. Get medical help at once. Call your local emergency services (911 in [place] Do not drive yourself to the hospital. MAKE SURE YOU:   Understand these  instructions.  Will watch your condition.  Will get help right away if you are not doing well or get worse. Document Released: 02/25/2005 Document Revised: 05/23/2013 Document Reviewed: 12/22/2007 [facility] Patient Information 2015 [place], [place]. This information is not intended to replace advice given to you by your health care provider. Make sure you discuss any questions you have with your health care provider.

## 2015-01-10 NOTE — ED Notes (Signed)
Pt to room 19 via w/c with no distress noted; pt reports left sided CP since 1230am accomp by dizziness; pt st hx afib

## 2015-01-10 NOTE — ED Notes (Signed)
Pharmacy called to get med

## 2015-01-10 NOTE — ED Provider Notes (Signed)
[facility] Emergency Department Provider Note  ____________________________________________  Time seen: Approximately 2:47 AM  I have reviewed the triage vital signs and the nursing notes.   HISTORY  Chief Complaint Chest Pain    HPI Darren Rogers is a 59 y.o. male who presents to the ED from home with complaints of palpitations and left-sided chest pain. Patient has a history of paroxysmal atrial fibrillation onaspirin therapy only. States he was resting in bed approximately 11:30 PM when he noted he was in A. Fib. Subsequently he experienced left-sided chest tightness not associated with diaphoresis, shortness of breath or nausea/vomiting. Patient currently takes metoprolol and reports he was taken off diltiazem approximately 2 months ago but does not know why. Denies recent travel/surgery. Denies fever, chills, abdominal pain, diarrhea, dysuria, headache, weakness, numbness/tingling.   Past Medical History  Diagnosis Date  . Tobacco abuse     a. ongoing  . Paroxysmal atrial fibrillation     a. s/p ablation procedure ~ 20 yrs ago; b. very symptomatic when goes into a-fib w/ RVR  . Hypertension   . Hyperlipidemia   . History of cardiac cath     a. cath 01/27/2014: LM widely patent, LAD mild diff dz w/o obs, LCx no sig obs - scattered 20-30% mLCx, RCA no obs dz, EF 70%  . History of echocardiogram     a. 03/29/2014: EF 55-60%, mild LVH, normal RVSP  . CAD (coronary artery disease) 04/11/2014    Patient Active Problem List   Diagnosis Date Noted  . CVA (cerebral infarction) 11/16/2014  . CAD in native artery 04/11/2014  . History of echocardiogram   . Paroxysmal atrial fibrillation   . STEMI (ST elevation myocardial infarction) 01/29/2014  . Tobacco abuse   . Hyperlipidemia   . Hypertension   . Atrial fibrillation with rapid ventricular response 01/27/2014    Past Surgical History  Procedure Laterality Date  . Cardiac catheterization    .  Cardiac catheterization      [facility]  . Left heart cath Right 01/27/2014    Procedure: LEFT HEART CATH;  Surgeon: Micheline Chapman, MD;  Location: [facility] CATH LAB;  Service: Cardiovascular;  Laterality: Right;    Current Outpatient Rx  Name  Route  Sig  Dispense  Refill  . aspirin EC 81 MG tablet   Oral   Take 81 mg by mouth 2 (two) times daily.          . metoprolol (LOPRESSOR) 50 MG tablet      TAKE ONE TABLET TWICE DAILY AS NEEDED   30 tablet   3   . amoxicillin (AMOXIL) 500 MG tablet   Oral   Take 1 tablet (500 mg total) by mouth 2 (two) times daily. Patient not taking: Reported on 01/10/2015   28 tablet   0   . apixaban (ELIQUIS) 5 MG TABS tablet   Oral   Take 1 tablet (5 mg total) by mouth 2 (two) times daily. Patient not taking: Reported on 01/10/2015   60 tablet   3   . atorvastatin (LIPITOR) 40 MG tablet   Oral   Take 1 tablet (40 mg total) by mouth daily. Patient not taking: Reported on 11/16/2014   90 tablet   3   . diltiazem (CARDIZEM CD) 180 MG 24 hr capsule   Oral   Take 1 capsule (180 mg total) by mouth daily.   30 capsule   11   . oxyCODONE-acetaminophen (ROXICET) 5-325 MG per tablet  Oral   Take 1 tablet by mouth every 8 (eight) hours as needed for moderate pain or severe pain (Do not drive or operate heavy machinery while taking as can cause drowsiness.).   10 tablet   0     Allergies Review of patient's allergies indicates no known allergies.  Family History  Problem Relation Age of Onset  . Coronary artery disease Father 33    Social History Social History  Substance Use Topics  . Smoking status: Current Every Day Smoker -- 1.00 packs/day for 30 years    Types: Cigarettes  . Smokeless tobacco: Never Used  . Alcohol Use: No    Review of Systems Constitutional: No fever/chills Eyes: No visual changes. ENT: No sore throat. Cardiovascular: Positive for chest pain. Respiratory: Denies shortness of breath. Gastrointestinal: No  abdominal pain.  No nausea, no vomiting.  No diarrhea.  No constipation. Genitourinary: Negative for dysuria. Musculoskeletal: Negative for back pain. Skin: Negative for rash. Neurological: Negative for headaches, focal weakness or numbness.  10-point ROS otherwise negative.  ____________________________________________   PHYSICAL EXAM:  VITAL SIGNS: ED Triage Vitals  Enc Vitals Group     BP 01/10/15 0221 135/90 mmHg     Pulse Rate 01/10/15 0221 123     Resp 01/10/15 0221 18     Temp 01/10/15 0221 97.1 F (36.2 C)     Temp Source 01/10/15 0221 Oral     SpO2 01/10/15 0221 99 %     Weight 01/10/15 0221 170 lb (77.111 kg)     Height 01/10/15 0221 5\' 6"  (1.676 m)     Head Cir --      Peak Flow --      Pain Score 01/10/15 0212 2     Pain Loc --      Pain Edu? --      Excl. in GC? --     Constitutional: Alert and oriented. Well appearing and in no acute distress. Eyes: Conjunctivae are normal. PERRL. EOMI. Head: Atraumatic. Nose: No congestion/rhinnorhea. Mouth/Throat: Mucous membranes are moist.  Oropharynx non-erythematous. Neck: No stridor.   Cardiovascular: Normal rate, irregular rhythm. Grossly normal heart sounds.  Good peripheral circulation. Respiratory: Normal respiratory effort.  No retractions. Lungs CTAB. Gastrointestinal: Soft and nontender. No distention. No abdominal bruits. No CVA tenderness. Musculoskeletal: No lower extremity tenderness nor edema.  No joint effusions. Neurologic:  Normal speech and language. No gross focal neurologic deficits are appreciated. No gait instability. Skin:  Skin is warm, dry and intact. No rash noted. Psychiatric: Mood and affect are normal. Speech and behavior are normal.  ____________________________________________   LABS (all labs ordered are listed, but only abnormal results are displayed)  Labs Reviewed  BASIC METABOLIC PANEL - Abnormal; Notable for the following:    Glucose, Bld 102 (*)    Calcium 8.8 (*)    All  other components within normal limits  CBC - Abnormal; Notable for the following:    RDW 15.5 (*)    All other components within normal limits  TROPONIN I  PROTIME-INR  BRAIN NATRIURETIC PEPTIDE  ETHANOL  URINE DRUG SCREEN, QUALITATIVE ([facility] ONLY)   ____________________________________________  EKG  ED ECG REPORT I, Carnie Bruemmer J, the attending physician, personally viewed and interpreted this ECG.   Date: 01/10/2015  EKG Time: 0215  Rate: 124  Rhythm: atrial fibrillation, rate 124  Axis: Normal  Intervals:none  ST&T Change: Nonspecific  ____________________________________________  RADIOLOGY  Portable chest x-ray (viewed by me, interpreted per Dr. Margarita Grizzle): No edema  or consolidation. ____________________________________________   PROCEDURES  Procedure(s) performed: None  Critical Care performed: No  ____________________________________________   INITIAL IMPRESSION / ASSESSMENT AND PLAN / ED COURSE  Pertinent labs & imaging results that were available during my care of the patient were reviewed by me and considered in my medical decision making (see chart for details).  59 year old male who presents with atrial fibrillation with rapid ventricular rate associated with left-sided chest tightness. IV diltiazem was administered with prompt resolution of patient's chest discomfort. He is currently resting at a rate of 97 in atrial fibrillation. Will check screening labs including troponin, chest x-ray and reassess.  ----------------------------------------- 5:11 AM on 01/10/2015 -----------------------------------------  Patient sleeping in no acute distress. Review of patient's records revealed that he was to discontinue metoprolol but continued diltiazem. Patient was under the assumption that he was to stop diltiazem 2 months ago and has only been taking metoprolol. PO diltiazem was given as well as a second IV bolus. Patient is currently resting in NAD. Will  continue to monitor.  ----------------------------------------- 6:17 AM on 01/10/2015 -----------------------------------------  Heart rate 80s to 90s. Patient denies chest pain; overall feeling better and smiling. I discussed with him extensively that he should call Dr. Jari Sportsman office this morning to clarify which medication (metoprolol versus diltiazem) he should be taking. Strict return precautions given. Patient verbalizes understanding and agrees with plan of care. ____________________________________________   FINAL CLINICAL IMPRESSION(S) / ED DIAGNOSES  Final diagnoses:  Chest pain, unspecified chest pain type  Paroxysmal atrial fibrillation      Irean Hong, MD 01/10/15 [phone]

## 2015-01-11 ENCOUNTER — Emergency Department (HOSPITAL_COMMUNITY)
Admission: EM | Admit: 2015-01-11 | Discharge: 2015-01-11 | Disposition: A | Discharge status: Home / Self Care | Payer: 59 | Attending: Emergency Medicine | Admitting: Emergency Medicine

## 2015-01-11 ENCOUNTER — Emergency Department
Admission: EM | Admit: 2015-01-11 | Discharge: 2015-01-11 | Payer: 59 | Attending: Emergency Medicine | Admitting: Emergency Medicine

## 2015-01-11 ENCOUNTER — Other Ambulatory Visit: Payer: Self-pay

## 2015-01-11 ENCOUNTER — Emergency Department (HOSPITAL_COMMUNITY): Payer: 59

## 2015-01-11 ENCOUNTER — Ambulatory Visit (HOSPITAL_COMMUNITY): Admit: 2015-01-11 | Payer: Self-pay | Admitting: Cardiovascular Disease

## 2015-01-11 ENCOUNTER — Encounter (HOSPITAL_COMMUNITY): Payer: Self-pay | Admitting: Emergency Medicine

## 2015-01-11 ENCOUNTER — Encounter: Payer: Self-pay | Admitting: Emergency Medicine

## 2015-01-11 ENCOUNTER — Emergency Department: Payer: 59

## 2015-01-11 DIAGNOSIS — Z79899 Other long term (current) drug therapy: Secondary | ICD-10-CM | POA: Insufficient documentation

## 2015-01-11 DIAGNOSIS — I2510 Atherosclerotic heart disease of native coronary artery without angina pectoris: Secondary | ICD-10-CM | POA: Insufficient documentation

## 2015-01-11 DIAGNOSIS — Z7901 Long term (current) use of anticoagulants: Secondary | ICD-10-CM | POA: Insufficient documentation

## 2015-01-11 DIAGNOSIS — Z720 Tobacco use: Secondary | ICD-10-CM | POA: Insufficient documentation

## 2015-01-11 DIAGNOSIS — R079 Chest pain, unspecified: Secondary | ICD-10-CM | POA: Insufficient documentation

## 2015-01-11 DIAGNOSIS — Z7982 Long term (current) use of aspirin: Secondary | ICD-10-CM | POA: Insufficient documentation

## 2015-01-11 DIAGNOSIS — I10 Essential (primary) hypertension: Secondary | ICD-10-CM | POA: Insufficient documentation

## 2015-01-11 DIAGNOSIS — Z9889 Other specified postprocedural states: Secondary | ICD-10-CM | POA: Insufficient documentation

## 2015-01-11 DIAGNOSIS — I480 Paroxysmal atrial fibrillation: Secondary | ICD-10-CM | POA: Diagnosis not present

## 2015-01-11 DIAGNOSIS — R9431 Abnormal electrocardiogram [ECG] [EKG]: Secondary | ICD-10-CM | POA: Diagnosis not present

## 2015-01-11 DIAGNOSIS — Z8679 Personal history of other diseases of the circulatory system: Secondary | ICD-10-CM

## 2015-01-11 DIAGNOSIS — R0789 Other chest pain: Secondary | ICD-10-CM

## 2015-01-11 DIAGNOSIS — I213 ST elevation (STEMI) myocardial infarction of unspecified site: Secondary | ICD-10-CM | POA: Insufficient documentation

## 2015-01-11 DIAGNOSIS — E785 Hyperlipidemia, unspecified: Secondary | ICD-10-CM | POA: Insufficient documentation

## 2015-01-11 LAB — BASIC METABOLIC PANEL
Anion gap: 8 (ref 5–15)
BUN: 12 mg/dL (ref 6–20)
CO2: 26 mmol/L (ref 22–32)
CREATININE: 1.3 mg/dL — AB (ref 0.61–1.24)
Calcium: 9.1 mg/dL (ref 8.9–10.3)
Chloride: 105 mmol/L (ref 101–111)
GFR calc Af Amer: >60 mL/min (ref >60)
GFR calc non Af Amer: 59 mL/min — ABNORMAL LOW (ref >60)
Glucose, Bld: 101 mg/dL — ABNORMAL HIGH (ref 65–99)
Potassium: 4.3 mmol/L (ref 3.5–5.1)
Sodium: 139 mmol/L (ref 135–145)

## 2015-01-11 LAB — COMPREHENSIVE METABOLIC PANEL
ALK PHOS: 70 U/L (ref 38–126)
ALT: 16 U/L — AB (ref 17–63)
AST: 24 U/L (ref 15–41)
Albumin: 3.8 g/dL (ref 3.5–5.0)
Anion gap: 10 (ref 5–15)
BILIRUBIN TOTAL: 0.5 mg/dL (ref 0.3–1.2)
BUN: 15 mg/dL (ref 6–20)
CHLORIDE: 107 mmol/L (ref 101–111)
CO2: 22 mmol/L (ref 22–32)
Calcium: 9.2 mg/dL (ref 8.9–10.3)
Creatinine, Ser: 1.19 mg/dL (ref 0.61–1.24)
GLUCOSE: 80 mg/dL (ref 65–99)
POTASSIUM: 3.7 mmol/L (ref 3.5–5.1)
Sodium: 139 mmol/L (ref 135–145)
Total Protein: 6.6 g/dL (ref 6.5–8.1)

## 2015-01-11 LAB — CBC
HCT: 43.4 % (ref 40.0–52.0)
HCT: 40.6 % (ref 39.0–52.0)
HEMOGLOBIN: 13.7 g/dL (ref 13.0–17.0)
Hemoglobin: 14.3 g/dL (ref 13.0–18.0)
MCH: 29.7 pg (ref 26.0–34.0)
MCH: 30.4 pg (ref 26.0–34.0)
MCHC: 33 g/dL (ref 32.0–36.0)
MCHC: 33.7 g/dL (ref 30.0–36.0)
MCV: 89.9 fL (ref 80.0–100.0)
MCV: 90 fL (ref 78.0–100.0)
Platelets: 236 10*3/uL (ref 150–440)
Platelets: 259 10*3/uL (ref 150–400)
RBC: 4.82 MIL/uL (ref 4.40–5.90)
RBC: 4.51 MIL/uL (ref 4.22–5.81)
RDW: 15.8 % — ABNORMAL HIGH (ref 11.5–14.5)
RDW: 15.9 % — AB (ref 11.5–15.5)
WBC: 9 10*3/uL (ref 3.8–10.6)
WBC: 9.1 10*3/uL (ref 4.0–10.5)

## 2015-01-11 LAB — TROPONIN I: Troponin I: <0.03 ng/mL (ref <0.031)

## 2015-01-11 LAB — APTT: APTT: 29 s (ref 24–36)

## 2015-01-11 LAB — I-STAT TROPONIN, ED: TROPONIN I, POC: 0.01 ng/mL (ref 0.00–0.08)

## 2015-01-11 LAB — PROTIME-INR
INR: 0.89
PROTHROMBIN TIME: 12.3 s (ref 11.4–15.0)

## 2015-01-11 SURGERY — LEFT HEART CATH AND CORONARY ANGIOGRAPHY
Anesthesia: LOCAL

## 2015-01-11 MED ORDER — HEPARIN SODIUM (PORCINE) 5000 UNIT/ML IJ SOLN
4000.0000 [IU] | Freq: Once | INTRAMUSCULAR | Status: AC
  Administered 2015-01-11: 4000 [IU] via INTRAVENOUS

## 2015-01-11 MED ORDER — NITROGLYCERIN 2 % TD OINT
1.0000 [in_us] | TOPICAL_OINTMENT | Freq: Four times a day (QID) | TRANSDERMAL | Status: DC
  Administered 2015-01-11: 1 [in_us] via TOPICAL

## 2015-01-11 MED ORDER — HEPARIN (PORCINE) IN NACL 100-0.45 UNIT/ML-% IJ SOLN
900.0000 [IU]/h | INTRAMUSCULAR | Status: DC
  Administered 2015-01-11: 900 [IU]/h via INTRAVENOUS
  Filled 2015-01-11: qty 250

## 2015-01-11 MED ORDER — METOPROLOL TARTRATE 100 MG PO TABS: 100.0000 mg | ORAL_TABLET | Freq: Two times a day (BID) | ORAL | Status: DC

## 2015-01-11 NOTE — ED Notes (Signed)
Pt verbalized understanding of d/c instructions to up dose of metoprolol and follow up with cardiology. Pt stable and NAD.

## 2015-01-11 NOTE — ED Notes (Signed)
Patient transported to X-ray 

## 2015-01-11 NOTE — ED Notes (Signed)
Pt brought by EMS from [facility] ED for further evaluation for possible stemi. Pt had a episode of Afib at work and when to ED on [place] ED EKG shows some ST depresion and pt transfer to [facility]. Pt seen by Cardiology on arrival code stemi cancel by Cardiology. Pt denies any CP or SOB at this time. SR on monitor.

## 2015-01-11 NOTE — ED Provider Notes (Signed)
CSN: [id number]     Arrival date & time 01/11/15  1927 History   First MD Initiated Contact with Patient 01/11/15 1946     Chief Complaint  Patient presents with  . Code STEMI     (Consider location/radiation/quality/duration/timing/severity/associated sxs/prior Treatment) The history is provided by the patient and medical records. No language interpreter was used.   patient was brought to our facility today as a code STEMI from [facility] emergency department. Patient was at work around 4 PM today whenever he had an episode of palpitations which he states is consistent with when he has A. fib with RVR. He states that at that point he felt lightheaded and also had chest pain as well. He went to the nurse that his work and she had started to check his blood pressure and also called 911. Patient relates that when EMS was on their way to evaluate him he spontaneously went back into a normal rhythm. He relates at that point his chest pain began to ease off however he was taken to [facility] for further evaluation. At that point they did note findings consistent on his EKG that appeared to be ST depressions and patient was socially taken to our facility for further evaluation. Patient states that 2 days ago he was seen again at the [facility] emergency department and was in A. fib with RVR. At that point a were able to chemically convert him with diltiazem. He relates that the following day he discontinued his normal metoprolol per ED instructions and started taking diltiazem. He relates that his episode today occurred shortly after that. He denies any recent fevers chills nausea vomiting or diarrhea. He relates his chest pain is totally resolved. He denies any lightheadedness currently. Patient denies any worsening or alleviating factors.  Past Medical History  Diagnosis Date  . Tobacco abuse     a. ongoing  . Paroxysmal atrial fibrillation     a. s/p ablation procedure ~ 20 yrs ago; b. very symptomatic when  goes into a-fib w/ RVR  . Hypertension   . Hyperlipidemia   . History of cardiac cath     a. cath 01/27/2014: LM widely patent, LAD mild diff dz w/o obs, LCx no sig obs - scattered 20-30% mLCx, RCA no obs dz, EF 70%  . History of echocardiogram     a. 03/29/2014: EF 55-60%, mild LVH, normal RVSP  . CAD (coronary artery disease) 04/11/2014   Past Surgical History  Procedure Laterality Date  . Cardiac catheterization    . Cardiac catheterization      [facility]  . Left heart cath Right 01/27/2014    Procedure: LEFT HEART CATH;  Surgeon: Micheline Chapman, MD;  Location: [facility] CATH LAB;  Service: Cardiovascular;  Laterality: Right;   Family History  Problem Relation Age of Onset  . Coronary artery disease Father 45   Social History  Substance Use Topics  . Smoking status: Current Every Day Smoker -- 1.00 packs/day for 30 years    Types: Cigarettes  . Smokeless tobacco: Never Used  . Alcohol Use: No    Review of Systems  Constitutional: Negative for fever and chills.  HENT: Negative for congestion and rhinorrhea.   Eyes: Negative for photophobia and visual disturbance.  Respiratory: Positive for shortness of breath (earlier, now resolved). Negative for chest tightness.   Cardiovascular: Positive for chest pain (earlier, as in HPI, now resolved) and palpitations (now resolved). Negative for leg swelling.  Gastrointestinal: Negative for nausea, vomiting and abdominal pain.  Genitourinary: Negative for dysuria and hematuria.  Musculoskeletal: Negative for back pain and neck pain.  Skin: Negative for color change and pallor.  Neurological: Negative for dizziness, light-headedness and numbness.  Psychiatric/Behavioral: Negative for confusion and agitation.  All other systems reviewed and are negative.     Allergies  Review of patient's allergies indicates no known allergies.  Home Medications   Prior to Admission medications   Medication Sig Start Date End Date Taking? Authorizing  Provider  amoxicillin (AMOXIL) 500 MG tablet Take 1 tablet (500 mg total) by mouth 2 (two) times daily. Patient not taking: Reported on 01/10/2015 11/17/14   Auburn Bilberry, MD  apixaban (ELIQUIS) 5 MG TABS tablet Take 1 tablet (5 mg total) by mouth 2 (two) times daily. Patient not taking: Reported on 01/10/2015 11/17/14   Auburn Bilberry, MD  aspirin EC 81 MG tablet Take 81 mg by mouth 2 (two) times daily.     Historical Provider, MD  atorvastatin (LIPITOR) 40 MG tablet Take 1 tablet (40 mg total) by mouth daily. Patient not taking: Reported on 11/16/2014 02/06/14   Iran Ouch, MD  diltiazem (CARDIZEM CD) 180 MG 24 hr capsule Take 1 capsule (180 mg total) by mouth daily. 01/29/14   Janetta Hora, PA-C  metoprolol (LOPRESSOR) 100 MG tablet Take 1 tablet (100 mg total) by mouth 2 (two) times daily. 01/11/15   Madolyn Frieze, MD  oxyCODONE-acetaminophen (ROXICET) 5-325 MG per tablet Take 1 tablet by mouth every 8 (eight) hours as needed for moderate pain or severe pain (Do not drive or operate heavy machinery while taking as can cause drowsiness.). 12/03/14   Renford Dills, NP   BP 125/68 mmHg  Pulse 69  Temp(Src) 98.1 F (36.7 C) (Oral)  Resp 17  Ht 5\' 10"  (1.778 m)  Wt 170 lb (77.111 kg)  BMI 24.39 kg/m2  SpO2 98% Physical Exam  Constitutional: He is oriented to person, place, and time. No distress.  HENT:  Head: Normocephalic and atraumatic.  Eyes: Conjunctivae and EOM are normal.  Neck: Normal range of motion. Neck supple.  Cardiovascular: Normal rate, regular rhythm and normal heart sounds.   No murmur heard. Pulmonary/Chest: Effort normal and breath sounds normal. No respiratory distress. He has no wheezes.  Abdominal: Soft. He exhibits no distension. There is no tenderness. There is no guarding.  Musculoskeletal: Normal range of motion. He exhibits no tenderness.  Neurological: He is alert and oriented to person, place, and time.  Skin: Skin is warm and dry. He is not diaphoretic.   Psychiatric: He has a normal mood and affect. His behavior is normal.    ED Course  Procedures (including critical care time) Labs Review Labs Reviewed  BASIC METABOLIC PANEL - Abnormal; Notable for the following:    Glucose, Bld 101 (*)    Creatinine, Ser 1.30 (*)    GFR calc non Af Amer 59 (*)    All other components within normal limits  CBC - Abnormal; Notable for the following:    RDW 15.9 (*)    All other components within normal limits  I-STAT TROPOININ, ED    Imaging Review Dg Chest 2 View  01/11/2015   CLINICAL DATA:  Chest pain  EXAM: CHEST  2 VIEW  COMPARISON:  01/11/2015  FINDINGS: Cardiomediastinal silhouette is stable. No acute infiltrate or pleural effusion. No pulmonary edema. Bony thorax is unremarkable.  IMPRESSION: No active cardiopulmonary disease.   Electronically Signed   By: Natasha Mead M.D.   On:  01/11/2015 20:21   Dg Chest Port 1 View  01/11/2015   CLINICAL DATA:  Chest pain and elevated heart rate  EXAM: PORTABLE CHEST - 1 VIEW  COMPARISON:  01/10/2015  FINDINGS: Mild cardiomegaly is noted. The lungs are well aerated with mild left basilar atelectasis. No focal confluent infiltrate is seen. Old rib fractures are noted on the right.  IMPRESSION: Mild left basilar atelectasis.  No acute infiltrate is seen.   Electronically Signed   By: Alcide Clever M.D.   On: 01/11/2015 19:12   Dg Chest Portable 1 View  01/10/2015   CLINICAL DATA:  Chest pain and dizziness  EXAM: PORTABLE CHEST - 1 VIEW  COMPARISON:  May 12, 2014  FINDINGS: Lungs are clear. Heart size is upper normal with pulmonary vascularity within normal limits. No adenopathy. No pneumothorax. No bone lesions.  IMPRESSION: No edema or consolidation.   Electronically Signed   By: Bretta Bang III M.D.   On: 01/10/2015 02:47   I, Madolyn Frieze, personally reviewed and evaluated these images and lab results as part of my medical decision-making.   EKG Interpretation   Date/Time:  Friday January 11 2015 19:33:30 EDT Ventricular Rate:  88 PR Interval:  152 QRS Duration: 78 QT Interval:  375 QTC Calculation: 454 R Axis:   66 Text Interpretation:  Sinus rhythm Biatrial enlargement Probable LVH with  secondary repol abnrm Anterior ST elevation, probably due to LVH Abnormal  ekg Confirmed by BEATON  MD, ROBERT ([id number]) on 01/11/2015 8:09:49 PM      MDM   Final diagnoses:  Chest pain, unspecified chest pain type  History of atrial fibrillation    Patient presents today from [facility] ED with concern for possible STEMI. On arrival cardiology was promptly at the bedside and review patient's EKG and prior heart catheterizations. They relate that they do not feel that this is ST elevation consistent with ischemia but more related to cardiac hypertrophy. They do not feel that any further intervention is indicated at this point. They do recommend checking a troponin here and if negative they relate patient can go home if asymptomatic. They recommend patient resumes his metoprolol on increases his dose to 100 mg twice daily.  Patient's troponin was notably negative on check here. Patient remained asymptomatic and hemodynamically stable. Given these reassuring symptoms and cardiology's consultation recommendations we'll discharge the patient home on metoprolol 100 mg twice a day. Patient is agreeable with this plan. Patient was given follow-up information with his cardiologist. Patient was stable at the time of discharge.    Madolyn Frieze, MD 01/12/15 [id number]  Nelva Nay, MD 01/12/15 [phone]

## 2015-01-11 NOTE — ED Provider Notes (Signed)
[facility] Emergency Department Provider Note   ____________________________________________  Time seen: 6 PM I have reviewed the triage vital signs and the triage nursing note.  HISTORY  Chief Complaint Chest Pain and Atrial Fibrillation   Historian Patient, somewhat poor historian  HPI Darren Rogers is a 60 y.o. male who has a history of paroxysmal atrial fibrillation, who had an episode of atrial fibrillation yesterday associated with chest pain for 2 seen in the emergency department and found to have a negative troponin and conversion to normal sinus rhythm. He was sent home last night. It's unclear whether or not he supposed to be on metoprolol or diltiazem, he was supposed to call and clarify this with his cardiologist Dr. Kirke Corin.  Today at work he had onset of fast irregular heartbeat with central moderate chest pain. He was given 4 baby aspirin, and then an additional 4 baby aspirin apparently when the fire department arrived. His heart rate did spontaneously convert to normal sinus rhythm and his chest pain is now gone.patient states he has had a catheterization for many years.    Past Medical History  Diagnosis Date  . Tobacco abuse     a. ongoing  . Paroxysmal atrial fibrillation     a. s/p ablation procedure ~ 20 yrs ago; b. very symptomatic when goes into a-fib w/ RVR  . Hypertension   . Hyperlipidemia   . History of cardiac cath     a. cath 01/27/2014: LM widely patent, LAD mild diff dz w/o obs, LCx no sig obs - scattered 20-30% mLCx, RCA no obs dz, EF 70%  . History of echocardiogram     a. 03/29/2014: EF 55-60%, mild LVH, normal RVSP  . CAD (coronary artery disease) 04/11/2014    Patient Active Problem List   Diagnosis Date Noted  . CVA (cerebral infarction) 11/16/2014  . CAD in native artery 04/11/2014  . History of echocardiogram   . Paroxysmal atrial fibrillation   . STEMI (ST elevation myocardial infarction) 01/29/2014  . Tobacco  abuse   . Hyperlipidemia   . Hypertension   . Atrial fibrillation with rapid ventricular response 01/27/2014    Past Surgical History  Procedure Laterality Date  . Cardiac catheterization    . Cardiac catheterization      [facility]  . Left heart cath Right 01/27/2014    Procedure: LEFT HEART CATH;  Surgeon: Micheline Chapman, MD;  Location: [facility] CATH LAB;  Service: Cardiovascular;  Laterality: Right;    Current Outpatient Rx  Name  Route  Sig  Dispense  Refill  . amoxicillin (AMOXIL) 500 MG tablet   Oral   Take 1 tablet (500 mg total) by mouth 2 (two) times daily. Patient not taking: Reported on 01/10/2015   28 tablet   0   . apixaban (ELIQUIS) 5 MG TABS tablet   Oral   Take 1 tablet (5 mg total) by mouth 2 (two) times daily. Patient not taking: Reported on 01/10/2015   60 tablet   3   . aspirin EC 81 MG tablet   Oral   Take 81 mg by mouth 2 (two) times daily.          Marland Kitchen atorvastatin (LIPITOR) 40 MG tablet   Oral   Take 1 tablet (40 mg total) by mouth daily. Patient not taking: Reported on 11/16/2014   90 tablet   3   . diltiazem (CARDIZEM CD) 180 MG 24 hr capsule   Oral   Take 1 capsule (  180 mg total) by mouth daily.   30 capsule   11   . metoprolol (LOPRESSOR) 50 MG tablet      TAKE ONE TABLET TWICE DAILY AS NEEDED   30 tablet   3   . oxyCODONE-acetaminophen (ROXICET) 5-325 MG per tablet   Oral   Take 1 tablet by mouth every 8 (eight) hours as needed for moderate pain or severe pain (Do not drive or operate heavy machinery while taking as can cause drowsiness.).   10 tablet   0     Allergies Review of patient's allergies indicates no known allergies.  Family History  Problem Relation Age of Onset  . Coronary artery disease Father 70    Social History Social History  Substance Use Topics  . Smoking status: Current Every Day Smoker -- 1.00 packs/day for 30 years    Types: Cigarettes  . Smokeless tobacco: Never Used  . Alcohol Use: No    Review of  Systems  Constitutional: Negative for fever. Eyes: Negative for visual changes. ENT: Negative for sore throat. Cardiovascular: positive for chest pain palpitations which are now relieved.Marland Kitchen Respiratory: Negative for shortness of breath. Gastrointestinal: Negative for abdominal pain, vomiting and diarrhea. Genitourinary: Negative for dysuria. Musculoskeletal: Negative for back pain. Skin: Negative for rash. Neurological: Negative for headaches, focal weakness or numbness. 10 point Review of Systems otherwise negative ____________________________________________   PHYSICAL EXAM:  VITAL SIGNS: ED Triage Vitals  Enc Vitals Group     BP 01/11/15 1745 114/69 mmHg     Pulse Rate 01/11/15 1745 76     Resp 01/11/15 1745 18     Temp 01/11/15 1745 98.1 F (36.7 C)     Temp Source 01/11/15 1745 Oral     SpO2 01/11/15 1745 97 %     Weight 01/11/15 1745 170 lb (77.111 kg)     Height 01/11/15 1745  (1.676 m)     Head Cir --      Peak Flow --      Pain Score 01/11/15 1746 0     Pain Loc --      Pain Edu? --      Excl. in GC? --      Constitutional: Alert and oriented. Well appearing and in no distress. Eyes: Conjunctivae are normal. PERRL. Normal extraocular movements. ENT   Head: Normocephalic and atraumatic.   Nose: No congestion/rhinnorhea.   Mouth/Throat: Mucous membranes are moist.   Neck: No stridor. Cardiovascular/Chest: Normal rate, regular rhythm.  No murmurs, rubs, or gallops. Respiratory: Normal respiratory effort without tachypnea nor retractions. Breath sounds are clear and equal bilaterally. No wheezes/rales/rhonchi. Gastrointestinal: Soft. No distention, no guarding, no rebound. Nontender\ Genitourinary/rectal:Deferred Musculoskeletal: Nontender with normal range of motion in all extremities. No joint effusions.  No lower extremity tenderness nor edema. Neurologic:  Normal speech and language. No gross or focal neurologic deficits are  appreciated. Skin:  Skin is warm, dry and intact. No rash noted. Psychiatric: Mood and affect are normal. Speech and behavior are normal. Patient exhibits appropriate insight and judgment.  ____________________________________________   EKG I, Governor Rooks, MD, the attending physician have personally viewed and interpreted all ECGs.  70 bpm. Normal sinus rhythm with PAC. Narrow QRS. ST segment 2 mm elevation in V1 and V2 with morphology most consistent with J-point elevation. T-wave inversions inferiorly and laterally with ST segment depression to, 3, aVF, and laterally.  In comparing to prior EKG, yesterday showed atrial fibrillation with rapid ventricular response and slight J-point  or ST segment elevation which appears to be worse today. The ST segment depression T-wave inversions are more significant/severe than previous. ____________________________________________  LABS (pertinent positives/negatives)  PENDING  ____________________________________________  RADIOLOGY All Xrays were viewed by me. Imaging interpreted by Radiologist.  Chest x-ray portable:renomegaly. __________________________________________  PROCEDURES  Procedure(s) performed: None  Critical Care performed: CRITICAL CARE Performed by: Governor Rooks   Total critical care time: 35 minutes  Critical care time was exclusive of separately billable procedures and treating other patients.  Critical care was necessary to treat or prevent imminent or life-threatening deterioration.  Critical care was time spent personally by me on the following activities: development of treatment plan with patient and/or surrogate as well as nursing, discussions with consultants, evaluation of patient's response to treatment, examination of patient, obtaining history from patient or surrogate, ordering and performing treatments and interventions, ordering and review of laboratory studies, ordering and review of radiographic  studies, pulse oximetry and re-evaluation of patient's condition.   ____________________________________________   ED COURSE / ASSESSMENT AND PLAN  CONSULTATIONS: phone consultation with Dr. Gery Pray, STEMI cardiologist [place]  Pertinent labs & imaging results that were available during my care of the patient were reviewed by me and considered in my medical decision making (see chart for details).   Patient's EKG was somewhat concerning for possibility of STEMI. The ST segment is elevated anteriorly with what appears to be a support changes. Clinically the patient is currently chest pain-free after resolution of an episode of irregularly irregular fast heartbeat prior to arrival. Morphology of the T-wave looks somewhat reassuring. However given the fact that the ST segment depressions and T-wave inversions look worse than previous EKG, I did call STEMI alert from [facility]. I spoke with physician Dr. Gery Pray who was able to look at a picture of the EKG. From the EKG he was not quite sure about going directly to the catheter lab, and requested that the patient stop in the emergency department to be seen by in-house cardiologist to make a decision about going to the Cath Lab.  Patient was given8. The aspirins prior to arrival, and in the emergency department he was given a 4000 unit heparin bolus, and Nitropaste. He is chest pain-free now.   Patient / Family / Caregiver informed of clinical course, medical decision-making process, and agree with plan.   ___________________________________________   FINAL CLINICAL IMPRESSION(S) / ED DIAGNOSES   Final diagnoses:  ST elevation myocardial infarction (STEMI), unspecified artery       Governor Rooks, MD 01/11/15 1846

## 2015-01-11 NOTE — Progress Notes (Signed)
ANTICOAGULATION CONSULT NOTE - Initial Consult  Pharmacy Consult for Heparin Indication: chest pain/ACS  No Known Allergies  Patient Measurements: Height: 5\' 6"  (167.6 cm) Weight: 170 lb (77.111 kg) IBW/kg (Calculated) : 63.8 Heparin Dosing Weight: 77.1 kg  Vital Signs: Temp: 98.1 F (36.7 C) (08/12 1745) Temp Source: Oral (08/12 1745) BP: 120/72 mmHg (08/12 1800) Pulse Rate: 75 (08/12 1800)  Labs:  Recent Labs  01/10/15 0230 01/10/15 0233  HGB  --  14.2  HCT  --  43.0  PLT  --  233  LABPROT 12.2  --   INR 0.89  --   CREATININE  --  1.13  TROPONINI  --  <0.03    Estimated Creatinine Clearance: 68.8 mL/min (by C-G formula based on Cr of 1.13).   Medical History: Past Medical History  Diagnosis Date  . Tobacco abuse     a. ongoing  . Paroxysmal atrial fibrillation     a. s/p ablation procedure ~ 20 yrs ago; b. very symptomatic when goes into a-fib w/ RVR  . Hypertension   . Hyperlipidemia   . History of cardiac cath     a. cath 01/27/2014: LM widely patent, LAD mild diff dz w/o obs, LCx no sig obs - scattered 20-30% mLCx, RCA no obs dz, EF 70%  . History of echocardiogram     a. 03/29/2014: EF 55-60%, mild LVH, normal RVSP  . CAD (coronary artery disease) 04/11/2014    Medications:   (Not in a hospital admission)  Assessment: 59 year old admitted with ACS, pt was on Eliquis 5 mg  PO BID at home for Afib.   Goal of Therapy:  Heparin level 0.3-0.7 units/ml Monitor platelets by anticoagulation protocol: Yes   Plan:  Will not bolus this pt due to home use of Eliquis.  Start heparin infusion at 900 units/hr. Will draw baseline aPTT and INR. Will draw HL and aPTT 6 hrs after start of drip.  Will use aPTT to guide heparin dosing until aPTT and HL coincide.   Rehan Holness D 01/11/2015,6:32 PM

## 2015-01-11 NOTE — Consult Note (Signed)
Admit date: 01/11/2015 Referring Physician  Dr. Radford Pax Primary Physician No PCP Per Patient Primary Cardiologist  Dr. Kirke Corin Reason for Consultation  ?STEMI  HPI: 59 year old male who was transported from [facility] to [facility] for possible ST elevation myocardial infarction. Yesterday he was in the emergency room after feeling a bout of atrial fibrillation that was short lived, paroxysmal. He was changed from metoprolol over to diltiazem.   He went to work today and approximately 4:30 PM he began to feel as heart racing again. During the rapid palpitations (atrial fibrillation presumed), he felt chest tightness. EMS was called. Once an EKG was performed which demonstrated significant J-point elevation in the anterior leads as well as T-wave inversions diffusely, a code STEMI was called and he was transported to [facility] hospital for further evaluation.  Here at [facility], after having a chance to review his prior medical history, he had a very similar episode on 01/27/14 where Dr. Tonny Bollman performed a urgent cardiac catheterization because of ST elevation myocardial infarction on EKG. Cardiac catheterization was unremarkable showing only 30-40% stenosis, mild diffuse nonobstructive CAD with vigorous LV systolic function without any regional wall motion abnormalities but evidence of LVH was noted (hence EKG abnormality).  Currently when talking with him, he is chest pain-free. Not having any difficulty. He certainly correlates rapid atrial fibrillation with chest discomfort he states.  In the past, he did not like to take diltiazem because he states that this caused erectile dysfunction. He was in the past on metoprolol tartrate 50 mg twice a day. He seemed to tolerate this well.    PMH:   Past Medical History  Diagnosis Date  . Tobacco abuse     a. ongoing  . Paroxysmal atrial fibrillation     a. s/p ablation procedure ~ 20 yrs ago; b. very symptomatic when goes into a-fib  w/ RVR  . Hypertension   . Hyperlipidemia   . History of cardiac cath     a. cath 01/27/2014: LM widely patent, LAD mild diff dz w/o obs, LCx no sig obs - scattered 20-30% mLCx, RCA no obs dz, EF 70%  . History of echocardiogram     a. 03/29/2014: EF 55-60%, mild LVH, normal RVSP  . CAD (coronary artery disease) 04/11/2014    PSH:   Past Surgical History  Procedure Laterality Date  . Cardiac catheterization    . Cardiac catheterization      [facility]  . Left heart cath Right 01/27/2014    Procedure: LEFT HEART CATH;  Surgeon: Micheline Chapman, MD;  Location: [facility] CATH LAB;  Service: Cardiovascular;  Laterality: Right;   Allergies:  Review of patient's allergies indicates no known allergies. Prior to Admit Meds:   Prior to Admission medications   Medication Sig Start Date End Date Taking? Authorizing Provider  amoxicillin (AMOXIL) 500 MG tablet Take 1 tablet (500 mg total) by mouth 2 (two) times daily. Patient not taking: Reported on 01/10/2015 11/17/14   Auburn Bilberry, MD  apixaban (ELIQUIS) 5 MG TABS tablet Take 1 tablet (5 mg total) by mouth 2 (two) times daily. Patient not taking: Reported on 01/10/2015 11/17/14   Auburn Bilberry, MD  aspirin EC 81 MG tablet Take 81 mg by mouth 2 (two) times daily.     Historical Provider, MD  atorvastatin (LIPITOR) 40 MG tablet Take 1 tablet (40 mg total) by mouth daily. Patient not taking: Reported on 11/16/2014 02/06/14   Iran Ouch, MD  diltiazem (CARDIZEM CD)  180 MG 24 hr capsule Take 1 capsule (180 mg total) by mouth daily. 01/29/14   Janetta Hora, PA-C  metoprolol (LOPRESSOR) 50 MG tablet TAKE ONE TABLET TWICE DAILY AS NEEDED 10/19/14   Iran Ouch, MD  oxyCODONE-acetaminophen (ROXICET) 5-325 MG per tablet Take 1 tablet by mouth every 8 (eight) hours as needed for moderate pain or severe pain (Do not drive or operate heavy machinery while taking as can cause drowsiness.). 12/03/14   Renford Dills, NP   Fam HX:    Family History  Problem  Relation Age of Onset  . Coronary artery disease Father 76   Social HX:    Social History   Social History  . Marital Status: Single    Spouse Name: N/A  . Number of Children: N/A  . Years of Education: N/A   Occupational History  . Not on file.   Social History Main Topics  . Smoking status: Current Every Day Smoker -- 1.00 packs/day for 30 years    Types: Cigarettes  . Smokeless tobacco: Never Used  . Alcohol Use: No  . Drug Use: No  . Sexual Activity: Not on file   Other Topics Concern  . Not on file   Social History Narrative   The patient lives in [place] with his sister. He works in a substance abuse program. 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 He is a long-time smoker, one pack per day. No illicit drugs. He is not married.     ROS:  Denies any syncope, bleeding, orthopnea, PND All 11 ROS were addressed and are negative except what is stated in the HPI   Physical Exam: Blood pressure 142/65, pulse 73, temperature 98.1 F (36.7 C), temperature source Oral, resp. rate 21, height 5\' 10"  (1.778 m), weight 170 lb (77.111 kg), SpO2 99 %.   General: Well developed, well nourished, in no acute distress Head: Eyes PERRLA, No xanthomas.   Normal cephalic and atramatic  Lungs:   Clear bilaterally to auscultation and percussion. Normal respiratory effort. No wheezes, no rales. Heart:   HRRR S1 S2 Pulses are 2+ & equal. No murmur, rubs, gallops.  No carotid bruit. No JVD.  No abdominal bruits.  Abdomen: Bowel sounds are positive, abdomen soft and non-tender without masses. No hepatosplenomegaly. Msk:  Back normal. Normal strength and tone for age. Extremities:  No clubbing, cyanosis or edema.  DP +1 Neuro: Alert and oriented X 3, non-focal, MAE x 4 GU: Deferred Rectal: Deferred Psych:  Good affect, responds appropriately      Labs: Lab Results  Component Value Date   WBC 9.0 01/11/2015   HGB 14.3 01/11/2015   HCT 43.4  01/11/2015   MCV 89.9 01/11/2015   PLT 236 01/11/2015     Recent Labs Lab 01/11/15 1815  NA 139  K 3.7  CL 107  CO2 22  BUN 15  CREATININE 1.19  CALCIUM 9.2  PROT 6.6  BILITOT 0.5  ALKPHOS 70  ALT 16*  AST 24  GLUCOSE 80    Recent Labs  01/10/15 0233 01/11/15 1815  TROPONINI <0.03 <0.03   Lab Results  Component Value Date   CHOL 260* 11/17/2014   HDL 40* 11/17/2014   LDLCALC 191* 11/17/2014   TRIG 147 11/17/2014   No results found for: DDIMER   Radiology:  Dg Chest Port 1 View  01/11/2015   CLINICAL DATA:  Chest pain and elevated heart rate  EXAM:  PORTABLE CHEST - 1 VIEW  COMPARISON:  01/10/2015  FINDINGS: Mild cardiomegaly is noted. The lungs are well aerated with mild left basilar atelectasis. No focal confluent infiltrate is seen. Old rib fractures are noted on the right.  IMPRESSION: Mild left basilar atelectasis.  No acute infiltrate is seen.   Electronically Signed   By: Alcide Clever M.D.   On: 01/11/2015 19:12   Dg Chest Portable 1 View  01/10/2015   CLINICAL DATA:  Chest pain and dizziness  EXAM: PORTABLE CHEST - 1 VIEW  COMPARISON:  May 12, 2014  FINDINGS: Lungs are clear. Heart size is upper normal with pulmonary vascularity within normal limits. No adenopathy. No pneumothorax. No bone lesions.  IMPRESSION: No edema or consolidation.   Electronically Signed   By: Bretta Bang III M.D.   On: 01/10/2015 02:47   Personally viewed.  EKG:  J-point elevation noted in lead V2, V3, V1, T-wave inversions noted throughout inferior and lateral leads as well as V4. LVH noted. ST segment elevation actually is less pronounced than it was last year. Personally viewed.   ASSESSMENT/PLAN:    59 year old male with paroxysmal fibrillation, symptomatic, with markedly abnormal EKG consistent with left ventricular hypertrophy, with recent cardiac catheterization demonstrating nonobstructive CAD. Currently chest pain-free.  1. Paroxysmal atrial fibrillation-  - we  will go ahead and increase his metoprolol from 50 mg twice a day up to 100 mg twice a day.  - Discontinue diltiazem long-acting  - He was aware of the medication sotalol. One of his friends may take this medication. Anti-arrhythmic would be the next step for him given his symptomatic nature with his atrial fibrillation.  - I will have him follow-up with Dr. Kirke Corin to discuss further, especially if higher dose metoprolol does not seem to work. (He was concerned about paying co-pay for Dr. Kirke Corin)  2. Markedly abnormal EKG  - He does demonstrate ST segment elevation, J-point elevation especially in the anterior precordial leads. This is not an ST elevation myocardial infarction however. His EKG abnormalities are a result of left ventricular hypertrophy. He has demonstrated similar EKGs in the past including one year ago when he ended up getting cardiac catheterization for what was being called an ST elevation myocardial infarction, thankfully nonobstructive CAD.  - Continue with aggressive control of hypertension.  3. Chest pain  - If most recent troponin is normal, I'm comfortable with him being discharged from the emergency department. He may very well feel symptomatic atrial fibrillation which hopefully increasing beta blocker will help.  - This is not a STEMI.  - Negative urine toxicology.   Donato Schultz, MD  01/11/2015  7:49 PM

## 2015-01-11 NOTE — ED Notes (Signed)
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

## 2015-01-22 ENCOUNTER — Telehealth: Payer: Self-pay | Admitting: Cardiovascular Disease

## 2015-01-22 ENCOUNTER — Ambulatory Visit (INDEPENDENT_AMBULATORY_CARE_PROVIDER_SITE_OTHER): Payer: 59 | Admitting: Cardiovascular Disease

## 2015-01-22 ENCOUNTER — Encounter: Payer: Self-pay | Admitting: Cardiovascular Disease

## 2015-01-22 VITALS — BP 118/74 | HR 62 | Ht 66.0 in | Wt 165.8 lb

## 2015-01-22 DIAGNOSIS — I2510 Atherosclerotic heart disease of native coronary artery without angina pectoris: Secondary | ICD-10-CM

## 2015-01-22 DIAGNOSIS — I4891 Unspecified atrial fibrillation: Secondary | ICD-10-CM

## 2015-01-22 DIAGNOSIS — E785 Hyperlipidemia, unspecified: Secondary | ICD-10-CM | POA: Diagnosis not present

## 2015-01-22 DIAGNOSIS — I480 Paroxysmal atrial fibrillation: Secondary | ICD-10-CM

## 2015-01-22 DIAGNOSIS — N522 Drug-induced erectile dysfunction: Secondary | ICD-10-CM

## 2015-01-22 DIAGNOSIS — I10 Essential (primary) hypertension: Secondary | ICD-10-CM | POA: Diagnosis not present

## 2015-01-22 MED ORDER — SILDENAFIL CITRATE 25 MG PO TABS: 25.0000 mg | ORAL_TABLET | Freq: Every day | ORAL | Status: DC | PRN

## 2015-01-22 MED ORDER — ATORVASTATIN CALCIUM 40 MG PO TABS: 40.0000 mg | ORAL_TABLET | Freq: Every day | ORAL | Status: DC

## 2015-01-22 NOTE — Assessment & Plan Note (Signed)
Lab Results  Component Value Date   CHOL 260* 11/17/2014   HDL 40* 11/17/2014   LDLCALC 191* 11/17/2014   TRIG 147 11/17/2014   CHOLHDL 6.5 11/17/2014   Given his mild nonobstructive coronary artery disease, I recommend an LDL target of less than 100. I added atorvastatin 40 mg once daily. Check fasting lipid and liver profile in 6 weeks.

## 2015-01-22 NOTE — Progress Notes (Signed)
HPI  This is a 59 year old African American male who is here today for a followup visit regarding paroxysmal atrial fibrillation.   He reports possibly having an ablation procedure about 20 years ago at [facility]. However, the details are not available. He has known history of hypertension, highly abnormal EKG likely due to hypertensive heart disease  and tobacco use. He presented in August 2015 with symptoms suggestive of unstable angina with EKG changes worrisome for anterior ST elevation. He underwent cardiac catheterization which showed mild nonobstructive coronary artery disease. He was in atrial fibrillation with rapid ventricular response and was treated with IV diltiazem. TEE/cardioversion was planned but the patient converted to normal sinus rhythm.  He presented again recently with similar symptoms and EKG changes. He was noted to be in atrial fibrillation with rapid ventricular response. He ruled out for myocardial infarction. The dose of metoprolol was increased to 100 mg twice daily. Since then, he reports no further palpitations. He is actually feeling very well and his only complaint is erectile dysfunction since increasing the dose of metoprolol. He cut down on smoking to half a pack per day.   No Known Allergies   Current Outpatient Prescriptions on File Prior to Visit  Medication Sig Dispense Refill  . aspirin EC 81 MG tablet Take 81 mg by mouth 2 (two) times daily.     . metoprolol (LOPRESSOR) 100 MG tablet Take 1 tablet (100 mg total) by mouth 2 (two) times daily. 60 tablet 0   No current facility-administered medications on file prior to visit.     Past Medical History  Diagnosis Date  . Tobacco abuse     a. ongoing  . Paroxysmal atrial fibrillation     a. s/p ablation procedure ~ 20 yrs ago; b. very symptomatic when goes into a-fib w/ RVR  . Hypertension   . Hyperlipidemia   . History of cardiac cath     a. cath 01/27/2014: LM widely patent, LAD mild diff dz w/o obs,  LCx no sig obs - scattered 20-30% mLCx, RCA no obs dz, EF 70%  . History of echocardiogram     a. 03/29/2014: EF 55-60%, mild LVH, normal RVSP  . CAD (coronary artery disease) 04/11/2014     Past Surgical History  Procedure Laterality Date  . Cardiac catheterization    . Cardiac catheterization      [facility]  . Left heart cath Right 01/27/2014    Procedure: LEFT HEART CATH;  Surgeon: Micheline Chapman, MD;  Location: [facility] CATH LAB;  Service: Cardiovascular;  Laterality: Right;     Family History  Problem Relation Age of Onset  . Coronary artery disease Father 16     Social History   Social History  . Marital Status: Single    Spouse Name: N/A  . Number of Children: N/A  . Years of Education: N/A   Occupational History  . Not on file.   Social History Main Topics  . Smoking status: Current Every Day Smoker -- 1.00 packs/day for 30 years    Types: Cigarettes  . Smokeless tobacco: Never Used  . Alcohol Use: No  . Drug Use: No  . Sexual Activity: Not on file   Other Topics Concern  . Not on file   Social History Narrative   The patient lives in [place] with his sister. He works in a substance abuse program. 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 He is a long-time  smoker, one pack per day. No illicit drugs. He is not married.     ROS A 10 point review of system was performed. It is negative other than that mentioned in the history of present illness.   PHYSICAL EXAM   BP 118/74 mmHg  Pulse 62  Ht  (1.676 m)  Wt 165 lb 12 oz (75.184 kg)  BMI 26.77 kg/m2 Constitutional: He is oriented to person, place, and time. He appears well-developed and well-nourished. No distress.  HENT: No nasal discharge.  Head: Normocephalic and atraumatic.  Eyes: Pupils are equal and round.  No discharge. Neck: Normal range of motion. Neck supple. No JVD present. No thyromegaly present.  Cardiovascular: Normal rate, regular rhythm, normal  heart sounds. Exam reveals no gallop and no friction rub. No murmur heard.  Pulmonary/Chest: Effort normal and breath sounds normal. No stridor. No respiratory distress. He has no wheezes. He has no rales. He exhibits no tenderness.  Abdominal: Soft. Bowel sounds are normal. He exhibits no distension. There is no tenderness. There is no rebound and no guarding.  Musculoskeletal: Normal range of motion. He exhibits no edema and no tenderness.  Neurological: He is alert and oriented to person, place, and time. Coordination normal.  Skin: Skin is warm and dry. No rash noted. He is not diaphoretic. No erythema. No pallor.  Psychiatric: He has a normal mood and affect. His behavior is normal. Judgment and thought content normal.       AVW:[id number]  Rhythm  -  T-abnormality  -Possible  Anterolateral  ischemia.   ABNORMAL     ASSESSMENT AND PLAN

## 2015-01-22 NOTE — Telephone Encounter (Signed)
Patient cannot get generic until pharmacy has approval to substitute.  Please call pharmacy .

## 2015-01-22 NOTE — Assessment & Plan Note (Signed)
He had mild nonobstructive coronary artery disease on previous catheterization. He has no symptoms of angina. Recommend aggressive medical therapy.

## 2015-01-22 NOTE — Assessment & Plan Note (Signed)
He is doing very well after recent increase of metoprolol to 100 mg twice daily with no further episodes of palpitations. If he develops recurrent symptoms, the next step would be an antiarrhythmics medication or consideration of A. fib ablation. He would be a candidate for class 1 C drugs.

## 2015-01-22 NOTE — Patient Instructions (Addendum)
Medication Instructions:  Your physician has recommended you make the following change in your medication:  START taking atorvastatin 40mg  once per day START taking viagra 25mg  as needed for ED   Labwork: Your physician recommends that you return for a FASTING lipid and liver profile in 6 weeks. Nothing to eat or drink after midnight the evening before your labs.   Testing/Procedures: none  Follow-Up: Your physician recommends that you schedule a follow-up appointment in: three months with Dr. Kirke Corin.    Any Other Special Instructions Will Be Listed Below (If Applicable).

## 2015-01-22 NOTE — Assessment & Plan Note (Signed)
Blood pressure is controlled on metoprolol. Given his extensive EKG changes, we should consider an echocardiogram in the near future.

## 2015-01-22 NOTE — Assessment & Plan Note (Signed)
Likely due to the high dose metoprolol. I discussed with him the option of switching to a different medication. However, diltiazem was not effective previously. An anti-arrhythmic medication is a consideration but he prefers to stay and metoprolol. Thus, I elected to prescribe low dose sildenafil.

## 2015-01-23 NOTE — Telephone Encounter (Signed)
S/w Kristen at pharmacy. Needs verbal order to substitute generic for Viagra. Verbal given. No further questions.

## 2015-02-14 ENCOUNTER — Other Ambulatory Visit: Payer: Self-pay

## 2015-02-14 MED ORDER — METOPROLOL TARTRATE 100 MG PO TABS: 100.0000 mg | ORAL_TABLET | Freq: Two times a day (BID) | ORAL | Status: DC

## 2015-02-14 NOTE — Telephone Encounter (Signed)
Refill sent for metoprolol tart 100 mg  

## 2015-03-04 ENCOUNTER — Other Ambulatory Visit (INDEPENDENT_AMBULATORY_CARE_PROVIDER_SITE_OTHER): Payer: 59 | Admitting: *Deleted

## 2015-03-04 DIAGNOSIS — E785 Hyperlipidemia, unspecified: Secondary | ICD-10-CM

## 2015-03-05 ENCOUNTER — Other Ambulatory Visit: Payer: 59

## 2015-03-05 LAB — LIPID PANEL
CHOLESTEROL TOTAL: 150 mg/dL (ref 100–199)
Chol/HDL Ratio: 3.6 ratio units (ref 0.0–5.0)
HDL: 42 mg/dL (ref >39)
LDL Calculated: 92 mg/dL (ref 0–99)
Triglycerides: 80 mg/dL (ref 0–149)
VLDL CHOLESTEROL CAL: 16 mg/dL (ref 5–40)

## 2015-03-05 LAB — HEPATIC FUNCTION PANEL
ALBUMIN: 3.9 g/dL (ref 3.5–5.5)
ALK PHOS: 98 IU/L (ref 39–117)
ALT: 14 IU/L (ref 0–44)
AST: 24 IU/L (ref 0–40)
Bilirubin Total: 0.3 mg/dL (ref 0.0–1.2)
Bilirubin, Direct: 0.12 mg/dL (ref 0.00–0.40)
Total Protein: 6.2 g/dL (ref 6.0–8.5)

## 2015-04-22 ENCOUNTER — Encounter: Payer: Self-pay | Admitting: Cardiovascular Disease

## 2015-04-22 ENCOUNTER — Ambulatory Visit (INDEPENDENT_AMBULATORY_CARE_PROVIDER_SITE_OTHER): Payer: 59 | Admitting: Cardiovascular Disease

## 2015-04-22 VITALS — BP 110/68 | HR 56 | Ht 66.0 in | Wt 165.5 lb

## 2015-04-22 DIAGNOSIS — I4892 Unspecified atrial flutter: Secondary | ICD-10-CM

## 2015-04-22 DIAGNOSIS — E785 Hyperlipidemia, unspecified: Secondary | ICD-10-CM

## 2015-04-22 DIAGNOSIS — R0602 Shortness of breath: Secondary | ICD-10-CM | POA: Diagnosis not present

## 2015-04-22 DIAGNOSIS — I480 Paroxysmal atrial fibrillation: Secondary | ICD-10-CM

## 2015-04-22 DIAGNOSIS — I10 Essential (primary) hypertension: Secondary | ICD-10-CM | POA: Diagnosis not present

## 2015-04-22 MED ORDER — ATORVASTATIN CALCIUM 40 MG PO TABS: 40.0000 mg | ORAL_TABLET | Freq: Every day | ORAL | Status: DC

## 2015-04-22 MED ORDER — METOPROLOL TARTRATE 100 MG PO TABS: 100.0000 mg | ORAL_TABLET | Freq: Two times a day (BID) | ORAL | Status: DC

## 2015-04-22 NOTE — Patient Instructions (Signed)
Medication Instructions:  Your physician recommends that you continue on your current medications as directed. Please refer to the Current Medication list given to you today.  Labwork: none  Testing/Procedures: Your physician has requested that you have an echocardiogram. Echocardiography is a painless test that uses sound waves to create images of your heart. It provides your doctor with information about the size and shape of your heart and how well your heart's chambers and valves are working. This procedure takes approximately one hour. There are no restrictions for this procedure.    Follow-Up: Your physician wants you to follow-up in: six months with Dr. [name].  You will receive a reminder letter in the mail two months in advance. If you don't receive a letter, please call our office to schedule the follow-up appointment.   Any Other Special Instructions Will Be Listed Below (If Applicable).     If you need a refill on your cardiac medications before your next appointment, please call your pharmacy.  Echocardiogram An echocardiogram, or echocardiography, uses sound waves (ultrasound) to produce an image of your heart. The echocardiogram is simple, painless, obtained within a short period of time, and offers valuable information to your health care provider. The images from an echocardiogram can provide information such as:  Evidence of coronary artery disease (CAD).  Heart size.  Heart muscle function.  Heart valve function.  Aneurysm detection.  Evidence of a past heart attack.  Fluid buildup around the heart.  Heart muscle thickening.  Assess heart valve function. LET YOUR HEALTH CARE PROVIDER KNOW ABOUT:  Any allergies you have.  All medicines you are taking, including vitamins, herbs, eye drops, creams, and over-the-counter medicines.  Previous problems you or members of your family have had with the use of anesthetics.  Any blood disorders you  have.  Previous surgeries you have had.  Medical conditions you have.  Possibility of pregnancy, if this applies. BEFORE THE PROCEDURE  No special preparation is needed. Eat and drink normally.  PROCEDURE   In order to produce an image of your heart, gel will be applied to your chest and a wand-like tool (transducer) will be moved over your chest. The gel will help transmit the sound waves from the transducer. The sound waves will harmlessly bounce off your heart to allow the heart images to be captured in real-time motion. These images will then be recorded.  You may need an IV to receive a medicine that improves the quality of the pictures. AFTER THE PROCEDURE You may return to your normal schedule including diet, activities, and medicines, unless your health care provider tells you otherwise.   This information is not intended to replace advice given to you by your health care provider. Make sure you discuss any questions you have with your health care provider.   Document Released: 05/15/2000 Document Revised: 06/08/2014 Document Reviewed: 01/23/2013 Elsevier Interactive Patient Education 2016 Elsevier Inc.  

## 2015-04-22 NOTE — Assessment & Plan Note (Signed)
I requested an echocardiogram for evaluation. He is a smoker and this is likely contributing.

## 2015-04-22 NOTE — Assessment & Plan Note (Signed)
Lab Results  Component Value Date   CHOL 150 03/04/2015   HDL 42 03/04/2015   LDLCALC 92 03/04/2015   TRIG 80 03/04/2015   CHOLHDL 3.6 03/04/2015   LDL improved significantly with atorvastatin and will be continued.

## 2015-04-22 NOTE — Progress Notes (Signed)
HPI  This is a 59 year old African American male who is here today for a followup visit regarding paroxysmal atrial fibrillation.   He reports possibly having an ablation procedure about 20 years ago at [facility]. However, the details are not available. He has known history of hypertension, highly abnormal EKG likely due to hypertensive heart disease  and tobacco use. He presented in August 2015 with symptoms suggestive of unstable angina with EKG changes worrisome for anterior ST elevation. He underwent cardiac catheterization which showed mild nonobstructive coronary artery disease. He was in atrial fibrillation with rapid ventricular response and was treated with IV diltiazem. TEE/cardioversion was planned but the patient converted to normal sinus rhythm.  He had a similar presentation in August of this year. The dose of metoprolol was increased to 100 mg twice daily. Since then, he had no recurrent palpitations. He denies any chest pain. He continues to complain of dyspnea with activities. He does feel palpitations if he misses a dose of metoprolol. Erectile dysfunction responded to sildenafil. He did have one brief episode of dizziness last week without syncope. His blood sugar was checked and it was fine. He also complains of bilateral thigh aching with walking. His femoral pulses are noted to be normal today.  No Known Allergies   Current Outpatient Prescriptions on File Prior to Visit  Medication Sig Dispense Refill  . aspirin EC 81 MG tablet Take 81 mg by mouth 2 (two) times daily.     Marland Kitchen atorvastatin (LIPITOR) 40 MG tablet Take 1 tablet (40 mg total) by mouth daily. 30 tablet 5  . metoprolol (LOPRESSOR) 100 MG tablet Take 1 tablet (100 mg total) by mouth 2 (two) times daily. 60 tablet 6  . sildenafil (VIAGRA) 25 MG tablet Take 1 tablet (25 mg total) by mouth daily as needed for erectile dysfunction. 8 tablet 3   No current facility-administered medications on file prior to visit.      Past Medical History  Diagnosis Date  . Tobacco abuse     a. ongoing  . Paroxysmal atrial fibrillation (HCC)     a. s/p ablation procedure ~ 20 yrs ago; b. very symptomatic when goes into a-fib w/ RVR  . Hypertension   . Hyperlipidemia   . History of cardiac cath     a. cath 01/27/2014: LM widely patent, LAD mild diff dz w/o obs, LCx no sig obs - scattered 20-30% mLCx, RCA no obs dz, EF 70%  . History of echocardiogram     a. 03/29/2014: EF 55-60%, mild LVH, normal RVSP  . CAD (coronary artery disease) 04/11/2014     Past Surgical History  Procedure Laterality Date  . Cardiac catheterization    . Cardiac catheterization      [facility]  . Left heart cath Right 01/27/2014    Procedure: LEFT HEART CATH;  Surgeon: Micheline Chapman, MD;  Location: [facility] CATH LAB;  Service: Cardiovascular;  Laterality: Right;     Family History  Problem Relation Age of Onset  . Coronary artery disease Father 62     Social History   Social History  . Marital Status: Single    Spouse Name: N/A  . Number of Children: N/A  . Years of Education: N/A   Occupational History  . Not on file.   Social History Main Topics  . Smoking status: Current Every Day Smoker -- 1.00 packs/day for 30 years    Types: Cigarettes  . Smokeless tobacco: Never Used  . Alcohol Use: No  .  Drug Use: No  . Sexual Activity: Not on file   Other Topics Concern  . Not on file   Social History Narrative   The patient lives in [place] with his sister. He works in a substance abuse program. 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 He is a long-time smoker, one pack per day. No illicit drugs. He is not married.     ROS A 10 point review of system was performed. It is negative other than that mentioned in the history of present illness.   PHYSICAL EXAM   BP 110/68 mmHg  Pulse 56  Ht 5\' 6"  (1.676 m)  Wt 165 lb 8 oz (75.07 kg)  BMI 26.73 kg/m2 Constitutional: He is oriented  to person, place, and time. He appears well-developed and well-nourished. No distress.  HENT: No nasal discharge.  Head: Normocephalic and atraumatic.  Eyes: Pupils are equal and round.  No discharge. Neck: Normal range of motion. Neck supple. No JVD present. No thyromegaly present.  Cardiovascular: Bradycardic, regular rhythm, normal heart sounds. Exam reveals no gallop and no friction rub. No murmur heard.  Pulmonary/Chest: Effort normal and breath sounds normal. No stridor. No respiratory distress. He has no wheezes. He has no rales. He exhibits no tenderness.  Abdominal: Soft. Bowel sounds are normal. He exhibits no distension. There is no tenderness. There is no rebound and no guarding.  Musculoskeletal: Normal range of motion. He exhibits no edema and no tenderness.  Neurological: He is alert and oriented to person, place, and time. Coordination normal.  Skin: Skin is warm and dry. No rash noted. He is not diaphoretic. No erythema. No pallor.  Psychiatric: He has a normal mood and affect. His behavior is normal. Judgment and thought content normal.  Femoral pulses are normal.     EKG: Sinus bradycardia. LVH with extensive inferior and anterolateral ST changes.  ASSESSMENT AND PLAN

## 2015-04-22 NOTE — Assessment & Plan Note (Signed)
He is maintaining in sinus rhythm with high dose metoprolol. CHADS VASc score is 1. He is on daily aspirin.

## 2015-04-22 NOTE — Assessment & Plan Note (Signed)
Blood pressure is well controlled on current medications. 

## 2015-05-13 ENCOUNTER — Other Ambulatory Visit: Payer: Self-pay

## 2015-05-13 ENCOUNTER — Ambulatory Visit (INDEPENDENT_AMBULATORY_CARE_PROVIDER_SITE_OTHER): Payer: 59

## 2015-05-13 DIAGNOSIS — R0602 Shortness of breath: Secondary | ICD-10-CM

## 2015-05-22 ENCOUNTER — Emergency Department
Admission: EM | Admit: 2015-05-22 | Discharge: 2015-05-22 | Disposition: A | Discharge status: Home / Self Care | Payer: 59 | Attending: Emergency Medicine | Admitting: Emergency Medicine

## 2015-05-22 ENCOUNTER — Telehealth: Payer: Self-pay

## 2015-05-22 ENCOUNTER — Emergency Department: Payer: 59

## 2015-05-22 ENCOUNTER — Encounter: Payer: Self-pay | Admitting: Medical Oncology

## 2015-05-22 ENCOUNTER — Other Ambulatory Visit: Payer: Self-pay

## 2015-05-22 DIAGNOSIS — Z79899 Other long term (current) drug therapy: Secondary | ICD-10-CM | POA: Insufficient documentation

## 2015-05-22 DIAGNOSIS — I10 Essential (primary) hypertension: Secondary | ICD-10-CM | POA: Insufficient documentation

## 2015-05-22 DIAGNOSIS — I2510 Atherosclerotic heart disease of native coronary artery without angina pectoris: Secondary | ICD-10-CM | POA: Diagnosis not present

## 2015-05-22 DIAGNOSIS — F17210 Nicotine dependence, cigarettes, uncomplicated: Secondary | ICD-10-CM | POA: Diagnosis not present

## 2015-05-22 DIAGNOSIS — I480 Paroxysmal atrial fibrillation: Secondary | ICD-10-CM | POA: Diagnosis not present

## 2015-05-22 DIAGNOSIS — Z7982 Long term (current) use of aspirin: Secondary | ICD-10-CM | POA: Insufficient documentation

## 2015-05-22 DIAGNOSIS — R079 Chest pain, unspecified: Secondary | ICD-10-CM | POA: Diagnosis present

## 2015-05-22 DIAGNOSIS — I4891 Unspecified atrial fibrillation: Secondary | ICD-10-CM

## 2015-05-22 LAB — CBC
HEMATOCRIT: 45 % (ref 40.0–52.0)
HEMOGLOBIN: 14.7 g/dL (ref 13.0–18.0)
MCH: 28.7 pg (ref 26.0–34.0)
MCHC: 32.7 g/dL (ref 32.0–36.0)
MCV: 87.9 fL (ref 80.0–100.0)
Platelets: 259 10*3/uL (ref 150–440)
RBC: 5.12 MIL/uL (ref 4.40–5.90)
RDW: 16.3 % — ABNORMAL HIGH (ref 11.5–14.5)
WBC: 7.9 10*3/uL (ref 3.8–10.6)

## 2015-05-22 LAB — BASIC METABOLIC PANEL
Anion gap: 6 (ref 5–15)
BUN: 22 mg/dL — ABNORMAL HIGH (ref 6–20)
CALCIUM: 9.5 mg/dL (ref 8.9–10.3)
CO2: 27 mmol/L (ref 22–32)
Chloride: 106 mmol/L (ref 101–111)
Creatinine, Ser: 1.2 mg/dL (ref 0.61–1.24)
GFR calc Af Amer: >60 mL/min (ref >60)
GFR calc non Af Amer: >60 mL/min (ref >60)
GLUCOSE: 115 mg/dL — AB (ref 65–99)
POTASSIUM: 4.1 mmol/L (ref 3.5–5.1)
Sodium: 139 mmol/L (ref 135–145)

## 2015-05-22 LAB — TROPONIN I
Troponin I: <0.03 ng/mL (ref <0.031)
Troponin I: <0.03 ng/mL (ref <0.031)

## 2015-05-22 MED ORDER — METOPROLOL TARTRATE 1 MG/ML IV SOLN
5.0000 mg | Freq: Once | INTRAVENOUS | Status: AC
  Administered 2015-05-22: 5 mg via INTRAVENOUS
  Filled 2015-05-22: qty 5

## 2015-05-22 MED ORDER — DILTIAZEM HCL 30 MG PO TABS
60.0000 mg | ORAL_TABLET | Freq: Once | ORAL | Status: AC
  Administered 2015-05-22: 60 mg via ORAL
  Filled 2015-05-22: qty 2

## 2015-05-22 MED ORDER — DILTIAZEM HCL 25 MG/5ML IV SOLN
20.0000 mg | Freq: Once | INTRAVENOUS | Status: AC
  Administered 2015-05-22: 20 mg via INTRAVENOUS
  Filled 2015-05-22: qty 5

## 2015-05-22 MED ORDER — SODIUM CHLORIDE 0.9 % IV BOLUS (SEPSIS)
1000.0000 mL | Freq: Once | INTRAVENOUS | Status: AC
  Administered 2015-05-22: 1000 mL via INTRAVENOUS

## 2015-05-22 NOTE — ED Notes (Signed)
Pt to triage with reports that he started having central chest pain yesterday- pain comes and goes, worsens with exertion. Pt also reports sob with pain.

## 2015-05-22 NOTE — ED Provider Notes (Signed)
[facility] Emergency Department Provider Note   ____________________________________________  Time seen: 1830  I have reviewed the triage vital signs and the nursing notes.   HISTORY  Chief Complaint Chest Pain   History limited by: Not Limited   HPI Darren Rogers is a 59 y.o. male with history of paroxysmal A. fib who presents to the emergency department today because of concerns for chest pain. He states that it started last night. He describes it as being on and off. He states that he usually has this pain when he was in A. fib, but he can normally since when he is in A. fib. He went to the nurse at his work who checked him out and did not think he was in A. fib. He states he went to bed but when he woke up he continued to have intermittent episodes of the pain. He states it initially feels like a sharp pain and then he feels like a boot is stepping on him. He denies any associated shortness of breath or diaphoresis.   Past Medical History  Diagnosis Date  . Tobacco abuse     a. ongoing  . Paroxysmal atrial fibrillation (HCC)     a. s/p ablation procedure ~ 20 yrs ago; b. very symptomatic when goes into a-fib w/ RVR  . Hypertension   . Hyperlipidemia   . History of cardiac cath     a. cath 01/27/2014: LM widely patent, LAD mild diff dz w/o obs, LCx no sig obs - scattered 20-30% mLCx, RCA no obs dz, EF 70%  . History of echocardiogram     a. 03/29/2014: EF 55-60%, mild LVH, normal RVSP  . CAD (coronary artery disease) 04/11/2014    Patient Active Problem List   Diagnosis Date Noted  . Shortness of breath 04/22/2015  . 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 CVA (cerebral infarction) 11/16/2014  . CAD in native artery 04/11/2014  . History of echocardiogram   . Paroxysmal atrial fibrillation (HCC)   . STEMI (ST elevation myocardial infarction) (HCC) 01/29/2014  . Tobacco abuse   . Hyperlipidemia   . Hypertension   . Atrial fibrillation  with rapid ventricular response (HCC) 01/27/2014    Past Surgical History  Procedure Laterality Date  . Cardiac catheterization    . Cardiac catheterization      [facility]  . Left heart cath Right 01/27/2014    Procedure: LEFT HEART CATH;  Surgeon: Micheline Chapman, MD;  Location: [facility] CATH LAB;  Service: Cardiovascular;  Laterality: Right;    Current Outpatient Rx  Name  Route  Sig  Dispense  Refill  . aspirin EC 81 MG tablet   Oral   Take 81 mg by mouth 2 (two) times daily.          Marland Kitchen atorvastatin (LIPITOR) 40 MG tablet   Oral   Take 1 tablet (40 mg total) by mouth daily.   30 tablet   5   . metoprolol (LOPRESSOR) 100 MG tablet   Oral   Take 1 tablet (100 mg total) by mouth 2 (two) times daily.   60 tablet   6   . sildenafil (VIAGRA) 25 MG tablet   Oral   Take 1 tablet (25 mg total) by mouth daily as needed for erectile dysfunction.   8 tablet   3     Allergies Review of patient's allergies indicates no known allergies.  Family History  Problem Relation Age of Onset  . Coronary artery disease  Father 54    Social History Social History  Substance Use Topics  . Smoking status: Current Every Day Smoker -- 1.00 packs/day for 30 years    Types: Cigarettes  . Smokeless tobacco: Never Used  . Alcohol Use: No    Review of Systems  Constitutional: Negative for fever. Cardiovascular: Positive for chest pain. Respiratory: Negative for shortness of breath. Gastrointestinal: Negative for abdominal pain, vomiting and diarrhea. Neurological: Negative for headaches, focal weakness or numbness.  10-point ROS otherwise negative.  ____________________________________________   PHYSICAL EXAM:  VITAL SIGNS: ED Triage Vitals  Enc Vitals Group     BP 05/22/15 1739 134/68 mmHg     Pulse Rate 05/22/15 1739 108     Resp 05/22/15 1739 18     Temp 05/22/15 1739 97.7 F (36.5 C)     Temp Source 05/22/15 1739 Oral     SpO2 05/22/15 1739 97 %     Weight 05/22/15 1739 165  lb (74.844 kg)     Height 05/22/15 1739 5\' 6"  (1.676 m)   Constitutional: Alert and oriented. Well appearing and in no distress. Eyes: Conjunctivae are normal. PERRL. Normal extraocular movements. ENT   Head: Normocephalic and atraumatic.   Nose: No congestion/rhinnorhea.   Mouth/Throat: Mucous membranes are moist.   Neck: No stridor. Hematological/Lymphatic/Immunilogical: No cervical lymphadenopathy. Cardiovascular: Irregularly irregular irregular rhythm tachycardic.  No murmurs, rubs, or gallops. Respiratory: Normal respiratory effort without tachypnea nor retractions. Breath sounds are clear and equal bilaterally. No wheezes/rales/rhonchi. Gastrointestinal: Soft and nontender. No distention. Genitourinary: Deferred Musculoskeletal: Normal range of motion in all extremities. No joint effusions.  No lower extremity tenderness nor edema. Neurologic:  Normal speech and language. No gross focal neurologic deficits are appreciated.  Skin:  Skin is warm, dry and intact. No rash noted. Psychiatric: Mood and affect are normal. Speech and behavior are normal. Patient exhibits appropriate insight and judgment.  ____________________________________________    LABS (pertinent positives/negatives)  Labs Reviewed  BASIC METABOLIC PANEL - Abnormal; Notable for the following:    Glucose, Bld 115 (*)    BUN 22 (*)    All other components within normal limits  CBC - Abnormal; Notable for the following:    RDW 16.3 (*)    All other components within normal limits  TROPONIN I  TROPONIN I     ____________________________________________   EKG  I, Phineas Semen, attending physician, personally viewed and interpreted this EKG  EKG Time: 1736 Rate: 122 Rhythm: atrial fibrillation with RVR Axis: normal Intervals: qtc 453 QRS: narrow ST changes: t wave inversion i II, V4, V5, V6, j point elevation Impression: abnormal ekg ____________________________________________     RADIOLOGY  CXR  IMPRESSION: No active cardiopulmonary disease. No significant change.  ____________________________________________   PROCEDURES  Procedure(s) performed: None  Critical Care performed: No  ____________________________________________   INITIAL IMPRESSION / ASSESSMENT AND PLAN / ED COURSE  Pertinent labs & imaging results that were available during my care of the patient were reviewed by me and considered in my medical decision making (see chart for details).  Patient presents to the emergency department today with concerns for chest pain. Patient was in A. fib with RVR. I did try metoprolol twice. I then tried diltiazem which controlled the patient's heart rate. 2 sets of troponin negative. Patient does have history of paroxysmal A. fib. Will plan on having patient follow up with Dr. Kirke Corin in the morning.  ____________________________________________   FINAL CLINICAL IMPRESSION(S) / ED DIAGNOSES  Final diagnoses:  Paroxysmal atrial fibrillation (HCC)  Atrial fibrillation with RVR (HCC)     Phineas Semen, MD 05/22/15 2258

## 2015-05-22 NOTE — Discharge Instructions (Signed)
Please seek medical attention for any high fevers, chest pain, shortness of breath, change in behavior, persistent vomiting, bloody stool or any other new or concerning symptoms.   Atrial Fibrillation Atrial fibrillation is a type of irregular or rapid heartbeat (arrhythmia). In atrial fibrillation, the heart quivers continuously in a chaotic pattern. This occurs when parts of the heart receive disorganized signals that make the heart unable to pump blood normally. This can increase the risk for stroke, heart failure, and other heart-related conditions. There are different types of atrial fibrillation, including:  Paroxysmal atrial fibrillation. This type starts suddenly, and it usually stops on its own shortly after it starts.  Persistent atrial fibrillation. This type often lasts longer than a week. It may stop on its own or with treatment.  Long-lasting persistent atrial fibrillation. This type lasts longer than 12 months.  Permanent atrial fibrillation. This type does not go away. Talk with your health care provider to learn about the type of atrial fibrillation that you have. CAUSES This condition is caused by some heart-related conditions or procedures, including:  A heart attack.  Coronary artery disease.  Heart failure.  Heart valve conditions.  High blood pressure.  Inflammation of the sac that surrounds the heart (pericarditis).  Heart surgery.  Certain heart rhythm disorders, such as Wolf-Parkinson-White syndrome. Other causes include:  Pneumonia.  Obstructive sleep apnea.  Blockage of an artery in the lungs (pulmonary embolism, or PE).  Lung cancer.  Chronic lung disease.  Thyroid problems, especially if the thyroid is overactive (hyperthyroidism).  Caffeine.  Excessive alcohol use or illegal drug use.  Use of some medicines, including certain decongestants and diet pills. Sometimes, the cause cannot be found. RISK FACTORS This condition is more likely  to develop in:  People who are older in age.  People who smoke.  People who have diabetes mellitus.  People who are overweight (obese).  Athletes who exercise vigorously. SYMPTOMS Symptoms of this condition include:  A feeling that your heart is beating rapidly or irregularly.  A feeling of discomfort or pain in your chest.  Shortness of breath.  Sudden light-headedness or weakness.  Getting tired easily during exercise. In some cases, there are no symptoms. DIAGNOSIS Your health care provider may be able to detect atrial fibrillation when taking your pulse. If detected, this condition may be diagnosed with:  An electrocardiogram (ECG).  A Holter monitor test that records your heartbeat patterns over a 24-hour period.  Transthoracic echocardiogram (TTE) to evaluate how blood flows through your heart.  Transesophageal echocardiogram (TEE) to view more detailed images of your heart.  A stress test.  Imaging tests, such as a CT scan or chest X-ray.  Blood tests. TREATMENT The main goals of treatment are to prevent blood clots from forming and to keep your heart beating at a normal rate and rhythm. The type of treatment that you receive depends on many factors, such as your underlying medical conditions and how you feel when you are experiencing atrial fibrillation. This condition may be treated with:  Medicine to slow down the heart rate, bring the heart's rhythm back to normal, or prevent clots from forming.  Electrical cardioversion. This is a procedure that resets your heart's rhythm by delivering a controlled, low-energy shock to the heart through your skin.  Different types of ablation, such as catheter ablation, catheter ablation with pacemaker, or surgical ablation. These procedures destroy the heart tissues that send abnormal signals. When the pacemaker is used, it is placed under your  skin to help your heart beat in a regular rhythm. HOME CARE  INSTRUCTIONS  Take over-the counter and prescription medicines only as told by your health care provider.  If your health care provider prescribed a blood-thinning medicine (anticoagulant), take it exactly as told. Taking too much blood-thinning medicine can cause bleeding. If you do not take enough blood-thinning medicine, you will not have the protection that you need against stroke and other problems.  Do not use tobacco products, including cigarettes, chewing tobacco, and e-cigarettes. If you need help quitting, ask your health care provider.  If you have obstructive sleep apnea, manage your condition as told by your health care provider.  Do not drink alcohol.  Do not drink beverages that contain caffeine, such as coffee, soda, and tea.  Maintain a healthy weight. Do not use diet pills unless your health care provider approves. Diet pills may make heart problems worse.  Follow diet instructions as told by your health care provider.  Exercise regularly as told by your health care provider.  Keep all follow-up visits as told by your health care provider. This is important. PREVENTION  Avoid drinking beverages that contain caffeine or alcohol.  Avoid certain medicines, especially medicines that are used for breathing problems.  Avoid certain herbs and herbal medicines, such as those that contain ephedra or ginseng.  Do not use illegal drugs, such as cocaine and amphetamines.  Do not smoke.  Manage your high blood pressure. SEEK MEDICAL CARE IF:  You notice a change in the rate, rhythm, or strength of your heartbeat.  You are taking an anticoagulant and you notice increased bruising.  You tire more easily when you exercise or exert yourself. SEEK IMMEDIATE MEDICAL CARE IF:  You have chest pain, abdominal pain, sweating, or weakness.  You feel nauseous.  You notice blood in your vomit, bowel movement, or urine.  You have shortness of breath.  You suddenly have  swollen feet and ankles.  You feel dizzy.  You have sudden weakness or numbness of the face, arm, or leg, especially on one side of the body.  You have trouble speaking, trouble understanding, or both (aphasia).  Your face or your eyelid droops on one side. These symptoms may represent a serious problem that is an emergency. Do not wait to see if the symptoms will go away. Get medical help right away. Call your local emergency services (911 in the [place] Do not drive yourself to the hospital.   This information is not intended to replace advice given to you by your health care provider. Make sure you discuss any questions you have with your health care provider.   Document Released: 05/18/2005 Document Revised: 02/06/2015 Document Reviewed: 09/12/2014 Elsevier Interactive Patient Education [employer]

## 2015-05-22 NOTE — Telephone Encounter (Signed)
Pt called to report SOB and chest pain yesterday at 3:30pm while he was at work.  Reported pain in his throat and neck area, radiating to his chest. Became more severe when he got home at 4pm. Took 4 baby aspirin, took deep breaths for 15 minutes, laid down and he states symptoms resolved. Symptoms returned around 12:30pm today. Nurse at work took BP and HR, pt reports it was "fine". He again took 4 baby aspirin which he states helped. He has noticed more pain w/exertion. States he has been in afib before but is sure he is not at this time.  Pt has hx of prior MI. Advised pt to seek immediate attention in the ER.  He is at work at this time and has someone to drive him. He is agreeable with plan and will proceed to the ER for further evaluation.

## 2015-05-22 NOTE — ED Notes (Signed)
Patient c/o chest pain on and off since last night. Patient states that the pain radiates up his neck and into left arm. Patient denies N/V, ShOB, and diaphoresis. Pain is worse with activity and is relieved with rest. Patient states that he has a hx/o A. Fib and sees Dr. Kirke Corin.

## 2015-05-22 NOTE — Telephone Encounter (Signed)
Pt c/o of Chest Pain: STAT if CP now or developed within 24 hours  1. Are you having CP right now? YES, very mild  2. Are you experiencing any other symptoms (ex. SOB, nausea, vomiting, sweating)? sob  3. How long have you been experiencing CP? Since yesterday  4. Is your CP continuous or coming and going? Comes and goes  5. Have you taken Nitroglycerin? No, just 4 aspirin last night ? States yesterday he had a spell where he blacked out. took 4 aspirin last night and pain subsided. States when he exerts himself he has SOB and CP

## 2015-05-23 ENCOUNTER — Telehealth: Payer: Self-pay

## 2015-05-23 NOTE — Telephone Encounter (Signed)
Pt called to let us know he went to the ED yesterday, and he was in afib. Would like to talk with you.

## 2015-05-23 NOTE — Telephone Encounter (Signed)
Left message on machine for patient to contact the office.   

## 2015-06-11 ENCOUNTER — Encounter: Payer: Self-pay | Admitting: Nurse Practitioner

## 2015-06-11 ENCOUNTER — Ambulatory Visit (INDEPENDENT_AMBULATORY_CARE_PROVIDER_SITE_OTHER): Payer: 59 | Admitting: Nurse Practitioner

## 2015-06-11 VITALS — BP 98/66 | HR 55 | Ht 66.0 in | Wt 168.8 lb

## 2015-06-11 DIAGNOSIS — I739 Peripheral vascular disease, unspecified: Secondary | ICD-10-CM | POA: Diagnosis not present

## 2015-06-11 DIAGNOSIS — I10 Essential (primary) hypertension: Secondary | ICD-10-CM | POA: Diagnosis not present

## 2015-06-11 DIAGNOSIS — I4891 Unspecified atrial fibrillation: Secondary | ICD-10-CM

## 2015-06-11 DIAGNOSIS — R55 Syncope and collapse: Secondary | ICD-10-CM

## 2015-06-11 DIAGNOSIS — I480 Paroxysmal atrial fibrillation: Secondary | ICD-10-CM

## 2015-06-11 DIAGNOSIS — I200 Unstable angina: Secondary | ICD-10-CM | POA: Diagnosis not present

## 2015-06-11 DIAGNOSIS — E785 Hyperlipidemia, unspecified: Secondary | ICD-10-CM

## 2015-06-11 MED ORDER — DILTIAZEM HCL 30 MG PO TABS: 30.0000 mg | ORAL_TABLET | Freq: Four times a day (QID) | ORAL | Status: DC | PRN

## 2015-06-11 NOTE — Patient Instructions (Addendum)
Medication Instructions:  Your physician has recommended you make the following change in your medication:  START taking diltiazem 30mg  every 6 hours AS NEEDED for palpitations   Labwork: none  Testing/Procedures: Your physician has requested that you have a lexiscan myoview. For further information please visit [email or web address] Please follow instruction sheet, as given.  [facility] MYOVIEW  Your caregiver has ordered a Stress Test with nuclear imaging. The purpose of this test is to evaluate the blood supply to your heart muscle. This procedure is referred to as a "Non-Invasive Stress Test." This is because other than having an IV started in your vein, nothing is inserted or "invades" your body. Cardiac stress tests are done to find areas of poor blood flow to the heart by determining the extent of coronary artery disease (CAD). Some patients exercise on a treadmill, which naturally increases the blood flow to your heart, while others who are  unable to walk on a treadmill due to physical limitations have a pharmacologic/chemical stress agent called Lexiscan . This medicine will mimic walking on a treadmill by temporarily increasing your coronary blood flow.   Please note: these test may take anywhere between 2-4 hours to complete  PLEASE REPORT TO [facility] ENTR[facility]  THE VOLUNTEERS AT THE FIRST DESK WILL DIRECT YOU WHERE TO GO  Date of Procedure: Monday, January 16 Arrival Time for Procedure: 7:45am  Instructions regarding medication:    _xx___:  Hold metoprolol the night before procedure and morning of procedure   PLEASE NOTIFY THE OFFICE AT LEAST 24 HOURS IN ADVANCE IF YOU ARE UNABLE TO KEEP YOUR APPOINTMENT.  [phone] AND  PLEASE NOTIFY NUCLEAR MEDICINE AT [facility] AT LEAST 24 HOURS IN ADVANCE IF YOU ARE UNABLE TO KEEP YOUR APPOINTMENT. [phone]  How to prepare for your Myoview test:   Do not eat or drink after midnight  No caffeine for 24 hours prior to  test  No smoking 24 hours prior to test.  Your medication may be taken with water.  If your doctor stopped a medication because of this test, do not take that medication.  Ladies, please do not wear dresses.  Skirts or pants are appropriate. Please wear a short sleeve shirt.  No perfume, cologne or lotion.  Wear comfortable walking shoes. No heels!          Your physician has requested that you have an ankle brachial index (ABI). During this test an ultrasound and blood pressure cuff are used to evaluate the arteries that supply the arms and legs with blood. Allow thirty minutes for this exam. There are no restrictions or special instructions.    Follow-Up: Your physician recommends that you schedule a follow-up appointment in: one month with Dr. Kirke Corin, [name], NP or Eula Listen, PA-C   Any Other Special Instructions Will Be Listed Below (If Applicable).     If you need a refill on your cardiac medications before your next appointment, please call your pharmacy.  Cardiac Nuclear Scanning A cardiac nuclear scan is used to check your heart for problems, such as the following:  A portion of the heart is not getting enough blood.  Part of the heart muscle has died, which happens with a heart attack.  The heart wall is not working normally.  In this test, a radioactive dye (tracer) is injected into your bloodstream. After the tracer has traveled to your heart, a scanning device is used to measure how much of the tracer is absorbed by or distributed to  various areas of your heart. LET [facility] CARE PROVIDER KNOW ABOUT:  Any allergies you have.  All medicines you are taking, including vitamins, herbs, eye drops, creams, and over-the-counter medicines.  Previous problems you or members of your family have had with the use of anesthetics.  Any blood disorders you have.  Previous surgeries you have had.  Medical conditions you have.  RISKS AND  COMPLICATIONS Generally, this is a safe procedure. However, as with any procedure, problems can occur. Possible problems include:   Serious chest pain.  Rapid heartbeat.  Sensation of warmth in your chest. This usually passes quickly. BEFORE THE PROCEDURE Ask your health care provider about changing or stopping your regular medicines. PROCEDURE This procedure is usually done at a hospital and takes 2-4 hours.  An IV tube is inserted into one of your veins.  Your health care provider will inject a small amount of radioactive tracer through the tube.  You will then wait for 20-40 minutes while the tracer travels through your bloodstream.  You will lie down on an exam table so images of your heart can be taken. Images will be taken for about 15-20 minutes.  You will exercise on a treadmill or stationary bike. While you exercise, your heart activity will be monitored with an electrocardiogram (ECG), and your blood pressure will be checked.  If you are unable to exercise, you may be given a medicine to make your heart beat faster.  When blood flow to your heart has peaked, tracer will again be injected through the IV tube.  After 20-40 minutes, you will get back on the exam table and have more images taken of your heart.  When the procedure is over, your IV tube will be removed. AFTER THE PROCEDURE  You will likely be able to leave shortly after the test. Unless your health care provider tells you otherwise, you may return to your normal schedule, including diet, activities, and medicines.  Make sure you find out how and when you will get your test results.   This information is not intended to replace advice given to you by your health care provider. Make sure you discuss any questions you have with your health care provider.   Document Released: 06/12/2004 Document Revised: 05/23/2013 Document Reviewed: 04/26/2013 Elsevier Interactive Patient Education 2016 [employer] Ankle-Brachial Index Test The ankle-brachial index (ABI) test is used to find peripheral vascular disease (PVD). PVD is also known as peripheral arterial disease (PAD). PVD is the blocking or hardening of the arteries anywhere within the circulatory system beyond the heart. PVD is caused by cholesterol deposits in your blood vessels (atherosclerosis). These deposits cause arteries to narrow. The delivery of oxygen to your tissues is impaired as a result. This can cause muscle pain and fatigue. This is called claudication. PVD means there may also be buildup of cholesterol in your:  Heart. This increases the risk of heart attacks.  Brain. This increases the risk of strokes. The ankle-brachial index test measures the blood flow in your arms and legs. This test also determines if blood vessels in your leg are narrowed by cholesterol deposits. There are additional causes of a reduced ankle-brachial index, such as inflammation of vessels or a clot in the vessels. However, these are much less common than narrowing due to cholesterol deposits. HOW IS THE ANKLE-BRACHIAL INDEX TEST DONE? The test is done while you are lying down and resting. Measurements are taken of the systolic pressure:  In your arm (brachial).  In your ankle at several points along your leg. Systolic pressure is the pressure inside your arteries when your heart pumps. The measurements are taken several times on both sides. Then, the highest systolic pressure of the ankle is divided by the highest brachial systolic pressure. The result is the ankle-brachial pressure ratio, or ABI. Sometimes this test is repeated after you have exercised on a treadmill for five minutes. You may have leg pain during the exercise portion of the test if you suffer from PAD. If the index number drops after exercise, this may show that PAD is present. A normal ABI ratio is between 0.9 and 1.4. A value below 0.9 is considered abnormal.    This  information is not intended to replace advice given to you by your health care provider. Make sure you discuss any questions you have with your health care provider.   Document Released: 05/22/2004 Document Revised: 06/08/2014 Document Reviewed: 12/22/2013 Elsevier Interactive Patient Education [employer]

## 2015-06-11 NOTE — Progress Notes (Signed)
Office Visit    Patient Name: Darren Rogers Date of Encounter: 06/11/2015  Primary Care Provider:  No PCP Per Patient Primary Cardiologist:  M. Kirke Corin, MD   Chief Complaint    60 year old male with a history of paroxysmal atrial fibrillation who presents for follow-up related to recurrent A. fib, chest pain, and claudication.  Past Medical History    Past Medical History  Diagnosis Date  . Tobacco abuse     a. ongoing - 1 ppd.  . Paroxysmal atrial fibrillation (HCC)     a. s/p ? ablation procedure ~ 20 yrs ago ([facility]); b. very symptomatic when goes into a-fib w/ RVR;  c. CHA2DS2VASc= 1-->ASA.  Marland Kitchen Essential hypertension   . Hyperlipidemia   . History of echocardiogram     a. 03/29/2014: EF 55-60%, mild LVH, normal RVSP;  b. 05/2015 Echo: EF 60-65%, no rwma, triv AI, mild MR, mildly dil LA.  . Non-obstructive CAD     a. 01/27/2014 Cath: LM nl, LAD mild diff dz w/o obs, LCx no sig obs - scattered 20-30% mLCx, RCA no obs dz, EF 70%  . Claudication (HCC)   . Erectile dysfunction    Past Surgical History  Procedure Laterality Date  . Cardiac catheterization    . Cardiac catheterization      [facility]  . Left heart cath Right 01/27/2014    Procedure: LEFT HEART CATH;  Surgeon: Micheline Chapman, MD;  Location: [facility] CATH LAB;  Service: Cardiovascular;  Laterality: Right;    Allergies  No Known Allergies  History of Present Illness    60 year old male with the above complex past medical history. He has a history of arrhythmias dating back approximately 20 years and says he had an ablation at [facility] at that time. He says the ablation was for atrial fibrillation although it is notable that the procedure was being performed 20 years ago. Over the past 10 years, he has had intermittent episodes of atrial fibrillation, each time associated with chest pain. He has undergone diagnostic catheterization in the setting of chest pain and A. fib, the last of which occurred in August 2015 revealing  nonobstructive CAD. Patient says that over the past year or so, he has had roughly 3-5 episodes of paroxysmal atrial fibrillation. The most recent episode occurred in December. Prior to his emergency room visit, he had been having exertional chest discomfort for a day or 2. When he went to the ER, he was found to be in A. fib with RVR. He says he was treated with IV metoprolol and finally IV diltiazem and then broke. He was discharged home from the emergency department. Since his ER visit, he has had no recurrence of A. fib. He has however had intermittent exertional chest pressure without associated symptoms, lasting 2-3 minutes, resolving spontaneously. There is no real rhyme or reason to the occurrence of this discomfort, as he was able to shovel snow over the weekend without any discomfort but walking up stairs or hills may cause him to have discomfort.  He has also noted progressive exertional bilateral thigh discomfort and burning that has been occurring for some time but now requires less and less activity prior to occurring. He has not had any change in sensation or hair distribution in his legs.  He also reports intermittent episodes of feeling as though he is going to black out with frank syncope resulting in a fall occurring approximately 3 weeks ago. He says these episodes are often preceded by lightheadedness  and then he feels as though he loses consciousness for 1-2 seconds prior to regaining consciousness. These episodes have occurred while standing and have not resulted in a fall or trauma. About 3 weeks ago, he was showering and felt lightheaded. He lost consciousness and fell in the shower. He felt weak afterwards and his girlfriend had to assist him back into the bed. He is adamant that he is not interested in wearing an event monitor.  He denies PND, orthopnea, edema, or early satiety.  Home Medications    Prior to Admission medications   Medication Sig Start Date End Date Taking?  Authorizing Provider  aspirin EC 81 MG tablet Take 81 mg by mouth 2 (two) times daily.    Yes Historical Provider, MD  atorvastatin (LIPITOR) 40 MG tablet Take 1 tablet (40 mg total) by mouth daily. 04/22/15  Yes Iran Ouch, MD  metoprolol (LOPRESSOR) 100 MG tablet Take 1 tablet (100 mg total) by mouth 2 (two) times daily. 04/22/15  Yes Iran Ouch, MD  sildenafil (VIAGRA) 25 MG tablet Take 1 tablet (25 mg total) by mouth daily as needed for erectile dysfunction. 01/22/15  Yes Iran Ouch, MD  diltiazem (CARDIZEM) 30 MG tablet Take 1 tablet (30 mg total) by mouth 4 (four) times daily as needed (Every 6 hours as needed for palpitations). 06/11/15   Ok Anis, NP    Review of Systems    As above, he has been having exertional chest pain, bilateral thigh claudication, intermittent atrial fibrillation associated with chest pain, intermittent presyncope and syncope, and also erectile dysfunction. He denies PND, orthopnea, edema, or early satiety.  All other systems reviewed and are otherwise negative except as noted above.  Physical Exam    VS:  BP 98/66 mmHg  Pulse 55  Ht 5\' 6"  (1.676 m)  Wt 168 lb 12.8 oz (76.567 kg)  BMI 27.26 kg/m2 , BMI Body mass index is 27.26 kg/(m^2). GEN: Well nourished, well developed, in no acute distress. HEENT: normal. Neck: Supple, no JVD, carotid bruits, or masses. Cardiac: RRR, no murmurs, rubs, or gallops. No clubbing, cyanosis, edema.  Radials/DP/PT 1+ and equal bilaterally. No abdominal or femoral bruits noted. Respiratory:  Respirations regular and unlabored, clear to auscultation bilaterally. GI: Soft, nontender, nondistended, BS + x 4. MS: no deformity or atrophy. Skin: warm and dry, no rash. Neuro:  Strength and sensation are intact. Psych: Normal affect.  Accessory Clinical Findings    ECG - sinus bradycardia, 55  LVH with repolarization abnormality. No acute changes.  Assessment & Plan    1.  Unstable angina: Over the  past 3-4 weeks, patient is experiencing exertional substernal chest discomfort. This started during an episode of atrial fibrillation but has been intermittent ever since. He does not believe that these had any more A. fib. Symptoms do not slowly occur with any rhyme or reason, as he was able to shovel his driveway the other day without any problems. He previously had a diagnostic catheterization in August 2015 revealing mild nonobstructive CAD. I will arrange for an exercise Cardiolite to rule out ischemia. He remains on aspirin, beta blocker, and statin therapy. I have advised that he not use Viagra at least until we have results from his stress test.  2. Bilateral lower extremity claudication: Patient reports a history of leg pain with walking that has progressed. Specifically, he has thigh burning and fatigue and symptoms resolve with rest. He has not previously had ABIs. I will arrange for  this. He has been counseled on importance of smoking cessation. Continue aspirin and statin.  3. Paroxysmal atrial fibrillation: Patient has had several hospitalizations related to recurrent A. fib, often in the setting of chest pain. He was recently seen in the emergency department at [facility] and broke following IV diltiazem. We discussed the management of paroxysmal atrial fibrillation. He remains on beta blocker therapy and was previously on long-acting diltiazem but this was switched secondary to the development of a erectile dysfunction. I have offered him referral to the A. fib clinic for consideration of antiarrhythmic versus ablative therapies however at this time, he would like to stay with a more simplified approach. I have provided him with a prescription for diltiazem 30 mg by mouth every 6 hours when necessary palpitations/A. fib in hopes that we may be able to avert repeat ER visits. If he has recurrent A. fib, I stressed that he would benefit from A. fib clinic referral and likely antiarrhythmic  therapy. He will think about it. He remains on aspirin only. CHA2DS2VASc is one in the setting of history of hypertension, though he does have at least nonobstructive cardiovascular disease and potentially obstructive disease. Await results of stress testing and ABIs. May need to rethink anticoagulation therapy.  4. Presyncope and syncope: Patient reports a relatively long history of presyncope and intermittent syncope. He had a syncopal spell while in the shower about 3 weeks ago. Symptoms were preceded by lightheadedness and he felt weak afterwards. He has been advised to wear a monitor in the past but says he never did because it would interfere with his work. He recently had an echo that showed normal LV function. I would question if he is potentially having A. fib with post termination pauses though episodes are not preceded by palpitations. Patient is not willing to reconsider wearing an event monitor. He was advised that he should not be driving.  5. Essential hypertension: Stable.  6. Ongoing tobacco abuse: Complete cessation advised. Continues to smoke 1 pack per day and is not considering quitting at this time.  7. Erectile dysfunction: I have advised that he should not be using Viagra at this time as he has been having exertional chest pain.   8. Hyperlipidemia: LDL was 92 and October with normal LFTs. Continue statin therapy.  9.  Disposition: Follow-up in one month or sooner if necessary following testing as outlined above.  Nicolasa Ducking, NP 06/11/2015, 12:03 PM

## 2015-06-14 ENCOUNTER — Other Ambulatory Visit: Payer: Self-pay | Admitting: Nurse Practitioner

## 2015-06-14 ENCOUNTER — Telehealth: Payer: Self-pay

## 2015-06-14 DIAGNOSIS — I739 Peripheral vascular disease, unspecified: Secondary | ICD-10-CM

## 2015-06-14 NOTE — Telephone Encounter (Signed)
Left detailed message on pt VM regarding treadmill myoview instructions. Left CB number in case of questions.

## 2015-06-17 ENCOUNTER — Telehealth: Payer: Self-pay | Admitting: Nurse Practitioner

## 2015-06-17 ENCOUNTER — Ambulatory Visit: Admission: RE | Admit: 2015-06-17 | Payer: 59 | Source: Ambulatory Visit

## 2015-06-17 NOTE — Telephone Encounter (Signed)
Patient missed nm study and needs to reschedule  Please call.

## 2015-06-18 ENCOUNTER — Ambulatory Visit: Payer: 59

## 2015-06-18 DIAGNOSIS — I739 Peripheral vascular disease, unspecified: Secondary | ICD-10-CM

## 2015-06-18 NOTE — Telephone Encounter (Signed)
S/w pt who states he missed treadmill myoview Monday d/t illness. Would like to reschedule and is agreeable w/Wednesday, Jan 25 at 8:30, arrival time 8:15am Pt agreeable w/plan

## 2015-06-25 ENCOUNTER — Telehealth: Payer: Self-pay

## 2015-06-25 NOTE — Telephone Encounter (Signed)
Reviewed treadmill myoview instructions w/pt who states he will be at the [facility] tomorrow at 8:15am. Pt verbalized understanding and had no further questions.

## 2015-06-26 ENCOUNTER — Ambulatory Visit
Admission: RE | Admit: 2015-06-26 | Discharge: 2015-06-26 | Disposition: A | Discharge status: Home / Self Care | Payer: 59 | Source: Ambulatory Visit | Attending: Nurse Practitioner | Admitting: Nurse Practitioner

## 2015-06-26 DIAGNOSIS — I200 Unstable angina: Secondary | ICD-10-CM | POA: Insufficient documentation

## 2015-06-26 LAB — NM MYOCAR MULTI W/SPECT W/WALL MOTION / EF
CHL CUP MPHR: 160 {beats}/min
CHL CUP NUCLEAR SSS: 0
CHL CUP STRESS STAGE 1 HR: 51 {beats}/min
CHL CUP STRESS STAGE 2 SPEED: 1 mph
CHL CUP STRESS STAGE 3 SPEED: 1 mph
CHL CUP STRESS STAGE 4 GRADE: 10 %
CHL CUP STRESS STAGE 4 HR: 96 {beats}/min
CHL CUP STRESS STAGE 4 SPEED: 1.7 mph
CHL CUP STRESS STAGE 6 GRADE: 0 %
CHL CUP STRESS STAGE 6 SPEED: 0 mph
CHL CUP STRESS STAGE 7 HR: 90 {beats}/min
CSEPED: 0 min
CSEPEDS: 0 s
CSEPEW: 7 METS
CSEPHR: 62 %
CSEPPHR: 116 {beats}/min
CSEPPMHR: 72 %
LV dias vol: 90 mL
LVSYSVOL: 35 mL
NUC STRESS TID: 1.08
Rest HR: 55 {beats}/min
SDS: 0
SRS: 0
Stage 2 Grade: 0 %
Stage 2 HR: 58 {beats}/min
Stage 3 Grade: 0 %
Stage 3 HR: 58 {beats}/min
Stage 5 Grade: 12 %
Stage 5 HR: 116 {beats}/min
Stage 5 Speed: 2.5 mph
Stage 6 HR: 94 {beats}/min
Stage 7 Grade: 0 %
Stage 7 Speed: 0 mph

## 2015-06-26 MED ORDER — TECHNETIUM TC 99M SESTAMIBI - CARDIOLITE
13.2100 | Freq: Once | INTRAVENOUS | Status: AC | PRN
  Administered 2015-06-26: 09:00:00 13.21 via INTRAVENOUS

## 2015-06-26 MED ORDER — REGADENOSON 0.4 MG/5ML IV SOLN
0.4000 mg | Freq: Once | INTRAVENOUS | Status: AC
  Administered 2015-06-26: 0.4 mg via INTRAVENOUS

## 2015-06-26 MED ORDER — TECHNETIUM TC 99M SESTAMIBI - CARDIOLITE
29.6800 | Freq: Once | INTRAVENOUS | Status: AC | PRN
  Administered 2015-06-26: 10:00:00 29.68 via INTRAVENOUS

## 2015-07-03 ENCOUNTER — Ambulatory Visit: Payer: 59 | Admitting: Nurse Practitioner

## 2015-07-18 ENCOUNTER — Ambulatory Visit: Payer: 59 | Admitting: Cardiovascular Disease

## 2015-07-26 ENCOUNTER — Ambulatory Visit: Payer: 59 | Admitting: Cardiovascular Disease

## 2015-07-29 ENCOUNTER — Ambulatory Visit (INDEPENDENT_AMBULATORY_CARE_PROVIDER_SITE_OTHER): Payer: 59 | Admitting: Cardiovascular Disease

## 2015-07-29 ENCOUNTER — Encounter: Payer: Self-pay | Admitting: Cardiovascular Disease

## 2015-07-29 VITALS — BP 124/68 | HR 64 | Ht 66.0 in | Wt 169.0 lb

## 2015-07-29 DIAGNOSIS — R079 Chest pain, unspecified: Secondary | ICD-10-CM

## 2015-07-29 DIAGNOSIS — R0602 Shortness of breath: Secondary | ICD-10-CM | POA: Diagnosis not present

## 2015-07-29 DIAGNOSIS — I4891 Unspecified atrial fibrillation: Secondary | ICD-10-CM

## 2015-07-29 NOTE — Patient Instructions (Addendum)
Medication Instructions:  Your physician recommends that you continue on your current medications as directed. Please refer to the Current Medication list given to you today.   Labwork: none  Testing/Procedures: Your physician has recommended that you wear a holter monitor. Holter monitors are medical devices that record the heart's electrical activity. Doctors most often use these monitors to diagnose arrhythmias. Arrhythmias are problems with the speed or rhythm of the heartbeat. The monitor is a small, portable device. You can wear one while you do your normal daily activities. This is usually used to diagnose what is causing palpitations/syncope (passing out).    Follow-Up: Your physician recommends that you schedule a follow-up appointment in: 3 months with Dr. Kirke Corin.    Any Other Special Instructions Will Be Listed Below (If Applicable).     If you need a refill on your cardiac medications before your next appointment, please call your pharmacy.  Holter Monitoring A Holter monitor is a small device that is used to detect abnormal heart rhythms. It clips to your clothing and is connected by wires to flat, sticky disks (electrodes) that attach to your chest. It is worn continuously for 24-48 hours. HOME CARE INSTRUCTIONS  Wear your Holter monitor at all times, even while exercising and sleeping, for as long as directed by your health care provider.  Make sure that the Holter monitor is safely clipped to your clothing or close to your body as recommended by your health care provider.  Do not get the monitor or wires wet.  Do not put body lotion or moisturizer on your chest.  Keep your skin clean.  Keep a diary of your daily activities, such as walking and doing chores. If you feel that your heartbeat is abnormal or that your heart is fluttering or skipping a beat:  Record what you are doing when it happens.  Record what time of day the symptoms occur.  Return your Holter  monitor as directed by your health care provider.  Keep all follow-up visits as directed by your health care provider. This is important. SEEK IMMEDIATE MEDICAL CARE IF:  You feel lightheaded or you faint.  You have trouble breathing.  You feel pain in your chest, upper arm, or jaw.  You feel sick to your stomach and your skin is pale, cool, or damp.  You heartbeat feels unusual or abnormal.   This information is not intended to replace advice given to you by your health care provider. Make sure you discuss any questions you have with your health care provider.   Document Released: 02/14/2004 Document Revised: 06/08/2014 Document Reviewed: 12/25/2013 Elsevier Interactive Patient Education [employer]

## 2015-07-29 NOTE — Progress Notes (Signed)
Cardiology Office Note   Date:  07/29/2015   ID:  Darren Rogers, DOB [birth year], MRN [id number]  PCP:  No PCP Per Patient  Cardiologist:   Lorine Bears, MD   Chief Complaint  Patient presents with  . other    1 month follow up. Meds reviewed by the patient verbally. Pt. c/o shortness of breath with exertion and chest pain.       History of Present Illness: Darren Rogers is a 60 y.o. male who presents for   A follow-up visit regarding chest pain and paroxysmal atrial fibrillation. He reports possibly having an ablation procedure about 20 years ago at [facility]. However, the details are not available. He has known history of hypertension, highly abnormal EKG likely due to hypertensive heart disease and tobacco use. He presented in August 2015 with symptoms suggestive of unstable angina with EKG changes worrisome for anterior ST elevation. He underwent cardiac catheterization which showed mild nonobstructive coronary artery disease. He was in atrial fibrillation with rapid ventricular response and was treated with IV diltiazem. TEE/cardioversion was planned but the patient converted to normal sinus rhythm.  He had a similar presentation in August of 2016. The dose of metoprolol was increased to 100 mg twice daily.  He was seen recently by [name]  For exertional chest pain and palpitations. He did have an episode of atrial fibrillation in December that required an emergency room visit. He converted to sinus rhythm with IV diltiazem. He was sent provided with short-acting diltiazem to be used as needed. He reports that he has to use this on average of twice weekly with good response.  due to exertional chest pain, he underwent a pharmacologic nuclear stress test which showed no evidence of ischemia with normal ejection fraction. He initially started as a treadmill stress test but was limited by fatigue, chest pain and leg pain.  He underwent noninvasive vascular evaluation which showed  mildly reduced ABI bilaterally with diffuse nonobstructive disease on duplex. He reports bilateral file pain with walking which is equal in both sides.  Past Medical History  Diagnosis Date  . Tobacco abuse     a. ongoing - 1 ppd.  . Paroxysmal atrial fibrillation (HCC)     a. s/p ? ablation procedure ~ 20 yrs ago ([facility]); b. very symptomatic when goes into a-fib w/ RVR;  c. CHA2DS2VASc= 1-->ASA.  Marland Kitchen Essential hypertension   . Hyperlipidemia   . History of echocardiogram     a. 03/29/2014: EF 55-60%, mild LVH, normal RVSP;  b. 05/2015 Echo: EF 60-65%, no rwma, triv AI, mild MR, mildly dil LA.  . Non-obstructive CAD     a. 01/27/2014 Cath: LM nl, LAD mild diff dz w/o obs, LCx no sig obs - scattered 20-30% mLCx, RCA no obs dz, EF 70%  . Claudication (HCC)   . Erectile dysfunction     Past Surgical History  Procedure Laterality Date  . Cardiac catheterization    . [facility]iac [facility]ization      [facility]  . Left heart cath Right 01/27/2014    Procedure: LEFT HEART CATH;  Surgeon: Micheline Chapman, MD;  [facility] C[facility] LAB;  Service: Cardiovascular;  Laterality: Right;     Current Outpatient Prescriptions  Medication Sig Dispense Refill  . aspirin EC 81 MG tablet Take 81 mg by mouth 2 (two) times daily.     Marland Kitchen atorvastatin (LIPITOR) 40 MG tablet Take 1 tablet (40 mg total) by mouth daily. 30 tablet 5  .  diltiazem (CARDIZEM) 30 MG tablet Take 1 tablet (30 mg total) by mouth 4 (four) times daily as needed (Every 6 hours as needed for palpitations). 30 tablet 2  . metoprolol (LOPRESSOR) 100 MG tablet Take 1 tablet (100 mg total) by mouth 2 (two) times daily. 60 tablet 6  . sildenafil (VIAGRA) 25 MG tablet Take 1 tablet (25 mg total) by mouth daily as needed for erectile dysfunction. 8 tablet 3   No current facility-administered medications for this visit.    Allergies:   Review of patient's allergies indicates no known allergies.    Social History:  The patient  reports that he has been  smoking Cigarettes.  He has a 30 pack-year smoking history. He has never used smokeless tobacco. He reports that he does not drink alcohol or use illicit drugs.   Family History:  The patient's family history includes Coronary artery disease (age of onset: 15) in his father.      PHYSICAL EXAM: VS:  BP 124/68 mmHg  Pulse 64  Ht 5\' 6"  (1.676 m)  Wt 169 lb (76.658 kg)  BMI 27.29 kg/m2 , BMI Body mass index is 27.29 kg/(m^2). GEN: Well nourished, well developed, in no acute distress HEENT: normal Neck: no JVD, carotid bruits, or masses Cardiac: RRR; no  rubs, or gallops,no edema . There is a 2/6 systolic ejection murmur in the pulmonic area Respiratory:  clear to auscultation bilaterally, normal work of breathing GI: soft, nontender, nondistended, + BS MS: no deformity or atrophy Skin: warm and dry, no rash Neuro:  Strength and sensation are intact Psych: euthymic mood, full affect Vascular: Femoral pulses are normal bilaterally. Distal pulses are faint but palpable.   EKG:  EKG is ordered today. The ekg ordered today demonstrates normal sinus rhythm with left ventricular hypertrophy with significant repolarization abnormalities especially in the anterolateral leads.   Recent Labs: 09/21/2014: Magnesium 1.8; Thyroid Stimulating Horm 1.738 01/10/2015: B Natriuretic Peptide 50.0 03/04/2015: ALT 14 05/22/2015: BUN 22*; Creatinine, Ser 1.20; Hemoglobin 14.7; Platelets 259; Potassium 4.1; Sodium 139    Lipid Panel    Component Value Date/Time   CHOL 150 03/04/2015 0830   CHOL 260* 11/17/2014 0427   CHOL 234* 12/06/2011 0132   TRIG 80 03/04/2015 0830   TRIG 381* 12/06/2011 0132   HDL 42 03/04/2015 0830   HDL 40* 11/17/2014 0427   HDL 34* 12/06/2011 0132   CHOLHDL 3.6 03/04/2015 0830   CHOLHDL 6.5 11/17/2014 0427   VLDL 29 11/17/2014 0427   VLDL 76* 12/06/2011 0132   LDLCALC 92 03/04/2015 0830   LDLCALC 191* 11/17/2014 0427   LDLCALC 124* 12/06/2011 0132      Wt Readings  from Last 3 Encounters:  07/29/15 169 lb (76.658 kg)  06/11/15 168 lb 12.8 oz (76.567 kg)  05/22/15 165 lb (74.844 kg)         ASSESSMENT AND PLAN:  1.  Paroxysmal atrial fibrillation : highly symptomatic when he goes into A. Fib. He is currently on metoprolol 100 mg twice daily. He has been having some breakthrough palpitations which responded well to diltiazem. I requested a 48-hour Holter monitor. It appears that he might be responding to diltiazem better than metoprolol. One option would be to add long acting diltiazem and decrease metoprolol. Antiarrhythmic medications are somewhat limited by significantly abnormal is on EKG with left ventricular hypertrophy. Thus, class IC drugs might not be a good option. We can consider referring him to the A. Fib clinic to consider ablation.  2. Exertional chest pain: this seems to be mostly associated with his palpitations similar to prior episodes. Recent pharmacologic nuclear stress test showed no evidence of ischemia with normal ejection fraction. Previous cardiac catheterization in 2015 showed mild nonobstructive coronary artery disease.  3. Bilateral thigh claudication: exact etiology of this is not entirely clear. His femoral pulses are relatively well-preserved. Recent ABI was moderately reduced but duplex showed no significant obstructive disease. His symptoms are overall mild and for now I recommend continuing medical therapy.  4. Tobacco use: discussed with him the importance of smoking cessation.   5. Hyperlipidemia: his LDL improved significantly on atorvastatin.    Disposition:   FU with me in 3 months  Signed, Lorine Bears, MD  07/29/2015 3:45 PM    [facility]

## 2015-08-22 ENCOUNTER — Emergency Department: Payer: 59

## 2015-08-22 ENCOUNTER — Encounter: Payer: Self-pay | Admitting: Emergency Medicine

## 2015-08-22 ENCOUNTER — Observation Stay
Admission: EM | Admit: 2015-08-22 | Discharge: 2015-08-22 | Disposition: A | Discharge status: Home / Self Care | Payer: 59 | Attending: Internal Medicine | Admitting: Internal Medicine

## 2015-08-22 DIAGNOSIS — I482 Chronic atrial fibrillation: Principal | ICD-10-CM | POA: Insufficient documentation

## 2015-08-22 DIAGNOSIS — I4891 Unspecified atrial fibrillation: Secondary | ICD-10-CM | POA: Insufficient documentation

## 2015-08-22 DIAGNOSIS — R002 Palpitations: Secondary | ICD-10-CM | POA: Diagnosis not present

## 2015-08-22 DIAGNOSIS — I10 Essential (primary) hypertension: Secondary | ICD-10-CM | POA: Insufficient documentation

## 2015-08-22 DIAGNOSIS — I480 Paroxysmal atrial fibrillation: Secondary | ICD-10-CM | POA: Diagnosis not present

## 2015-08-22 DIAGNOSIS — R079 Chest pain, unspecified: Secondary | ICD-10-CM | POA: Diagnosis present

## 2015-08-22 DIAGNOSIS — Z79899 Other long term (current) drug therapy: Secondary | ICD-10-CM | POA: Insufficient documentation

## 2015-08-22 DIAGNOSIS — N529 Male erectile dysfunction, unspecified: Secondary | ICD-10-CM | POA: Insufficient documentation

## 2015-08-22 DIAGNOSIS — R0602 Shortness of breath: Secondary | ICD-10-CM | POA: Insufficient documentation

## 2015-08-22 DIAGNOSIS — I2510 Atherosclerotic heart disease of native coronary artery without angina pectoris: Secondary | ICD-10-CM | POA: Insufficient documentation

## 2015-08-22 DIAGNOSIS — R000 Tachycardia, unspecified: Secondary | ICD-10-CM | POA: Diagnosis not present

## 2015-08-22 DIAGNOSIS — I739 Peripheral vascular disease, unspecified: Secondary | ICD-10-CM | POA: Diagnosis not present

## 2015-08-22 DIAGNOSIS — Z9889 Other specified postprocedural states: Secondary | ICD-10-CM | POA: Insufficient documentation

## 2015-08-22 DIAGNOSIS — Z720 Tobacco use: Secondary | ICD-10-CM | POA: Diagnosis not present

## 2015-08-22 DIAGNOSIS — R0600 Dyspnea, unspecified: Secondary | ICD-10-CM | POA: Diagnosis not present

## 2015-08-22 DIAGNOSIS — R0789 Other chest pain: Secondary | ICD-10-CM | POA: Diagnosis present

## 2015-08-22 DIAGNOSIS — I4901 Ventricular fibrillation: Secondary | ICD-10-CM | POA: Insufficient documentation

## 2015-08-22 DIAGNOSIS — Z7982 Long term (current) use of aspirin: Secondary | ICD-10-CM | POA: Diagnosis not present

## 2015-08-22 DIAGNOSIS — F17210 Nicotine dependence, cigarettes, uncomplicated: Secondary | ICD-10-CM | POA: Insufficient documentation

## 2015-08-22 DIAGNOSIS — E785 Hyperlipidemia, unspecified: Secondary | ICD-10-CM | POA: Insufficient documentation

## 2015-08-22 DIAGNOSIS — F17200 Nicotine dependence, unspecified, uncomplicated: Secondary | ICD-10-CM | POA: Insufficient documentation

## 2015-08-22 LAB — BASIC METABOLIC PANEL
ANION GAP: 6 (ref 5–15)
BUN: 17 mg/dL (ref 6–20)
CO2: 25 mmol/L (ref 22–32)
Calcium: 9.2 mg/dL (ref 8.9–10.3)
Chloride: 106 mmol/L (ref 101–111)
Creatinine, Ser: 1.1 mg/dL (ref 0.61–1.24)
GFR calc Af Amer: >60 mL/min (ref >60)
GFR calc non Af Amer: >60 mL/min (ref >60)
GLUCOSE: 103 mg/dL — AB (ref 65–99)
POTASSIUM: 3.8 mmol/L (ref 3.5–5.1)
Sodium: 137 mmol/L (ref 135–145)

## 2015-08-22 LAB — CBC
HEMATOCRIT: 42.6 % (ref 40.0–52.0)
HEMOGLOBIN: 14.2 g/dL (ref 13.0–18.0)
MCH: 29.5 pg (ref 26.0–34.0)
MCHC: 33.4 g/dL (ref 32.0–36.0)
MCV: 88.4 fL (ref 80.0–100.0)
Platelets: 232 10*3/uL (ref 150–440)
RBC: 4.81 MIL/uL (ref 4.40–5.90)
RDW: 16.2 % — ABNORMAL HIGH (ref 11.5–14.5)
WBC: 8.4 10*3/uL (ref 3.8–10.6)

## 2015-08-22 LAB — TROPONIN I
Troponin I: <0.03 ng/mL (ref <0.031)
Troponin I: <0.03 ng/mL (ref <0.031)
Troponin I: <0.03 ng/mL (ref <0.031)

## 2015-08-22 LAB — TSH: TSH: 1.918 u[IU]/mL (ref 0.350–4.500)

## 2015-08-22 LAB — HEMOGLOBIN A1C: Hgb A1c MFr Bld: 6 % (ref 4.0–6.0)

## 2015-08-22 MED ORDER — RIVAROXABAN 20 MG PO TABS
20.0000 mg | ORAL_TABLET | Freq: Every day | ORAL | Status: DC
  Administered 2015-08-22: 20 mg via ORAL
  Filled 2015-08-22 (×2): qty 1

## 2015-08-22 MED ORDER — DILTIAZEM HCL ER COATED BEADS 120 MG PO CP24: 120.0000 mg | ORAL_CAPSULE | Freq: Every day | ORAL | Status: DC

## 2015-08-22 MED ORDER — ACETAMINOPHEN 650 MG RE SUPP: 650.0000 mg | Freq: Four times a day (QID) | RECTAL | Status: DC | PRN

## 2015-08-22 MED ORDER — DOCUSATE SODIUM 100 MG PO CAPS: 100.0000 mg | ORAL_CAPSULE | Freq: Two times a day (BID) | ORAL | Status: DC

## 2015-08-22 MED ORDER — METOPROLOL TARTRATE 100 MG PO TABS
100.0000 mg | ORAL_TABLET | Freq: Two times a day (BID) | ORAL | Status: DC
  Administered 2015-08-22: 100 mg via ORAL
  Filled 2015-08-22 (×2): qty 1

## 2015-08-22 MED ORDER — RIVAROXABAN 20 MG PO TABS: 20.0000 mg | ORAL_TABLET | Freq: Every day | ORAL | Status: DC

## 2015-08-22 MED ORDER — DILTIAZEM HCL 25 MG/5ML IV SOLN
INTRAVENOUS | Status: AC
  Administered 2015-08-22: 10 mg via INTRAVENOUS
  Filled 2015-08-22: qty 5

## 2015-08-22 MED ORDER — ONDANSETRON HCL 4 MG PO TABS: 4.0000 mg | ORAL_TABLET | Freq: Four times a day (QID) | ORAL | Status: DC | PRN

## 2015-08-22 MED ORDER — ACETAMINOPHEN 325 MG PO TABS: 650.0000 mg | ORAL_TABLET | Freq: Four times a day (QID) | ORAL | Status: DC | PRN

## 2015-08-22 MED ORDER — DILTIAZEM HCL 30 MG PO TABS: 30.0000 mg | ORAL_TABLET | Freq: Four times a day (QID) | ORAL | Status: DC | PRN

## 2015-08-22 MED ORDER — ASPIRIN EC 81 MG PO TBEC
81.0000 mg | DELAYED_RELEASE_TABLET | Freq: Two times a day (BID) | ORAL | Status: DC
  Filled 2015-08-22: qty 1

## 2015-08-22 MED ORDER — ENOXAPARIN SODIUM 80 MG/0.8ML ~~LOC~~ SOLN: 1.0000 mg/kg | Freq: Two times a day (BID) | SUBCUTANEOUS | Status: DC

## 2015-08-22 MED ORDER — ATORVASTATIN CALCIUM 20 MG PO TABS
40.0000 mg | ORAL_TABLET | Freq: Every day | ORAL | Status: DC
  Administered 2015-08-22: 40 mg via ORAL
  Filled 2015-08-22 (×2): qty 2

## 2015-08-22 MED ORDER — ONDANSETRON HCL 4 MG/2ML IJ SOLN: 4.0000 mg | Freq: Four times a day (QID) | INTRAMUSCULAR | Status: DC | PRN

## 2015-08-22 MED ORDER — MORPHINE SULFATE (PF) 2 MG/ML IV SOLN: 2.0000 mg | INTRAVENOUS | Status: DC | PRN

## 2015-08-22 MED ORDER — DILTIAZEM HCL ER COATED BEADS 120 MG PO CP24
120.0000 mg | ORAL_CAPSULE | Freq: Every day | ORAL | Status: DC
  Administered 2015-08-22: 120 mg via ORAL
  Filled 2015-08-22: qty 1

## 2015-08-22 MED ORDER — SODIUM CHLORIDE 0.9% FLUSH: 3.0000 mL | Freq: Two times a day (BID) | INTRAVENOUS | Status: DC

## 2015-08-22 MED ORDER — DILTIAZEM HCL 25 MG/5ML IV SOLN
10.0000 mg | Freq: Once | INTRAVENOUS | Status: AC
  Administered 2015-08-22: 10 mg via INTRAVENOUS

## 2015-08-22 NOTE — ED Notes (Signed)
Pt asleep, equal rise and fall of chest noted.

## 2015-08-22 NOTE — ED Notes (Signed)
Pt asleep on R side equal rise and fall of chest, wife at bedside. This RN woke pt up to take vitals. Pt rolled to L side, went back to sleep after VS obtained.

## 2015-08-22 NOTE — ED Notes (Signed)
Pt presents to ED via EMS with irregular heartbeat and chest pain with sob since Tuesday. Chest pain radiates into his left arm and worsens with movement. Hx of the same. Pt states he did not want to come in to the hospital so he waited to see if would stop on his own. Pt currently has no increased work of breathing or distress noted. EMS report pt VS were within normal limits and FSBS was 88.

## 2015-08-22 NOTE — Discharge Summary (Signed)
[facility] - [place] at [facility]   PATIENT NAME: Hernan Turnage    MR#:  [id number]  DATE OF BIRTH:  1955-10-27  DATE OF ADMISSION:  08/22/2015 ADMITTING PHYSICIAN: Arnaldo Natal, MD  DATE OF DISCHARGE: 08/22/15  PRIMARY CARE PHYSICIAN: No PCP Per Patient    ADMISSION DIAGNOSIS:  Atrial fibrillation with rapid ventricular response (HCC) [I48.91] Chest pain, unspecified chest pain type [R07.9]  DISCHARGE DIAGNOSIS:  Acute on chronic A. fib with RVR improved now in sinus rhythm Oral anticoagulation started during this admission  SECONDARY DIAGNOSIS:   Past Medical History  Diagnosis Date  . Tobacco abuse     a. ongoing - 1 ppd.  . Paroxysmal atrial fibrillation (HCC)     a. s/p ? ablation procedure ~ 20 yrs ago ([facility]); b. very symptomatic when goes into a-fib w/ RVR;  c. CHA2DS2VASc= 1-->ASA.  Marland Kitchen Essential hypertension   . Hyperlipidemia   . History of echocardiogram     a. 03/29/2014: EF 55-60%, mild LVH, normal RVSP;  b. 05/2015 Echo: EF 60-65%, no rwma, triv AI, mild MR, mildly dil LA.  . Non-obstructive CAD     a. 01/27/2014 Cath: LM nl, LAD mild diff dz w/o obs, LCx no sig obs - scattered 20-30% mLCx, RCA no obs dz, EF 70%  . Claudication (HCC)   . Erectile dysfunction     HOSPITAL COURSE:  Mr Amirault  Is a 60 year-old male admitted for atrial fibrillation with rapid ventricular response and chest pain.  1. Acute on chronic A. fib with rapid ventricular response: Rate has improved patient on metoprolol 100 mg twice a day and now started on Cardizem CD 120 by mouth daily by Dr. Mariah Milling -now on po Xarelto  2. Chest pain: Likely secondary to demand ischemia due to persistent tachycardia.  3. Essential hypertension: Continue metoprolol and diltiazem  4. Tobacco abuse:  patient smokes half a pack per day. He opts not to use a NicoDerm patch while hospitalized.  5. DVT prophylaxis: Lovenox   patient is a full code.  Patient is medically  stable okay to discharge from cardiology standpoint.  CONSULTS OBTAINED:  Treatment Team:  Antonieta Iba, MD  DRUG ALLERGIES:  No Known Allergies  DISCHARGE MEDICATIONS:   Current Discharge Medication List    START taking these medications   Details  diltiazem (CARDIZEM CD) 120 MG 24 hr capsule Take 1 capsule (120 mg total) by mouth daily. Qty: 30 capsule, Refills: 2    rivaroxaban (XARELTO) 20 MG TABS tablet Take 1 tablet (20 mg total) by mouth daily with supper. Qty: 30 tablet, Refills: 2      CONTINUE these medications which have NOT CHANGED   Details  aspirin EC 81 MG tablet Take 81 mg by mouth 2 (two) times daily.     atorvastatin (LIPITOR) 40 MG tablet Take 1 tablet (40 mg total) by mouth daily. Qty: 30 tablet, Refills: 5    diltiazem (CARDIZEM) 30 MG tablet Take 30 mg by mouth 4 (four) times daily as needed (for palpitations).    metoprolol (LOPRESSOR) 100 MG tablet Take 1 tablet (100 mg total) by mouth 2 (two) times daily. Qty: 60 tablet, Refills: 6    sildenafil (VIAGRA) 25 MG tablet Take 1 tablet (25 mg total) by mouth daily as needed for erectile dysfunction. Qty: 8 tablet, Refills: 3        If you experience worsening of your admission symptoms, develop shortness of breath, life threatening emergency,  suicidal or homicidal thoughts you must seek medical attention immediately by calling 911 or calling your MD immediately  if symptoms less severe.  You Must read complete instructions/literature along with all the possible adverse reactions/side effects for all the Medicines you take and that have been prescribed to you. Take any new Medicines after you have completely understood and accept all the possible adverse reactions/side effects.   Please note  You were cared for by a hospitalist during your hospital stay. If you have any questions about your discharge medications or the care you received while you were in the hospital after you are discharged, you  can call the unit and asked to speak with the hospitalist on call if the hospitalist that took care of you is not available. Once you are discharged, your primary care physician will handle any further medical issues. Please note that NO REFILLS for any discharge medications will be authorized once you are discharged, as it is imperative that you return to your primary care physician (or establish a relationship with a primary care physician if you do not have one) for your aftercare needs so that they can reassess your need for medications and monitor your lab values. Today   SUBJECTIVE   Patient doing well. No palpitations or chest pain.  VITAL SIGNS:  Blood pressure 137/81, pulse 91, temperature 97.8 F (36.6 C), temperature source Oral, resp. rate 17, height 5\' 6"  (1.676 m), weight 74.753 kg (164 lb 12.8 oz), SpO2 99 %.  I/O:  No intake or output data in the 24 hours ending 08/22/15 1411  PHYSICAL EXAMINATION:  GENERAL:  60 y.o.-year-old patient lying in the bed with no acute distress.  EYES: Pupils equal, round, reactive to light and accommodation. No scleral icterus. Extraocular muscles intact.  HEENT: Head atraumatic, normocephalic. Oropharynx and nasopharynx clear.  NECK:  Supple, no jugular venous distention. No thyroid enlargement, no tenderness.  LUNGS: Normal breath sounds bilaterally, no wheezing, rales,rhonchi or crepitation. No use of accessory muscles of respiration.  CARDIOVASCULAR: S1, S2 normal. No murmurs, rubs, or gallops.  ABDOMEN: Soft, non-tender, non-distended. Bowel sounds present. No organomegaly or mass.  EXTREMITIES: No pedal edema, cyanosis, or clubbing.  NEUROLOGIC: Cranial nerves II through XII are intact. Muscle strength 5/5 in all extremities. Sensation intact. Gait not checked.  PSYCHIATRIC: The patient is alert and oriented x 3.  SKIN: No obvious rash, lesion, or ulcer.   DATA REVIEW:   CBC   Recent Labs Lab 08/22/15 0141  WBC 8.4  HGB 14.2  HCT  42.6  PLT 232    Chemistries   Recent Labs Lab 08/22/15 0141  NA 137  K 3.8  CL 106  CO2 25  GLUCOSE 103*  BUN 17  CREATININE 1.10  CALCIUM 9.2    Microbiology Results   No results found for this or any previous visit (from the past 240 hour(s)).  RADIOLOGY:  Dg Chest Port 1 View  08/22/2015  CLINICAL DATA:  60 year old male with dyspnea and palpitation EXAM: PORTABLE CHEST 1 VIEW COMPARISON:  Chest radiograph dated 05/22/2015 FINDINGS: The heart size and mediastinal contours are within normal limits. Both lungs are clear. The visualized skeletal structures are unremarkable. IMPRESSION: No active disease. Electronically Signed   By: Elgie Collard M.D.   On: 08/22/2015 01:43     Management plans discussed with the patient, family and they are in agreement.  CODE STATUS:     Code Status Orders        Start  Ordered   08/22/15 0737  Full code   Continuous     08/22/15 0736    Code Status History    Date Active Date Inactive Code Status Order ID Comments User Context   11/16/2014 12:12 PM 11/17/2014  1:57 PM Full Code [id number]  Alford Highland, MD ED   01/27/2014  4:26 PM 01/29/2014  4:05 PM Full Code [id number]  Tonny Bollman, MD Inpatient      TOTAL TIME TAKING CARE OF THIS PATIENT: 40 minutes.    Rayvon Brandvold M.D on 08/22/2015 at 2:11 PM  Between 7am to 6pm - Pager - [phone] After 6pm go to [email or web address] - password [email or web address] [facility]  [facility]  Office  [phone]  CC: Primary care physician; No PCP Per Patient

## 2015-08-22 NOTE — H&P (Signed)
Darren Rogers is an 60 y.o. male.   Chief Complaint: Chest pain HPI: The patient with past medical history of paroxysmal atrial fibrillation status post ablation presents emergency department complaining of chest pain and palpitations. He states that he began to feel his heart racing while he was walking at a normal pace. After a few moments of palpitations he began to feel substernal chest pain that felt as if it may radiate to the back. He admits to shortness of breath as well as some mild nausea at that time. In the emergency department the patient was found to have a heart rate in the 180s. Following diltiazem administration his heart rate slowed and he immediately began to feel relief of his chest pain. At the time of my interview the patient is chest pain-free. Due to his continued atrial fibrillation with rapid ventricular rate and intermittent chest pain emergency department staff called for admission.  Past Medical History  Diagnosis Date  . Tobacco abuse     a. ongoing - 1 ppd.  . Paroxysmal atrial fibrillation ([place])     a. s/p ? ablation procedure ~ 20 yrs ago ([facility]); b. very symptomatic when goes into a-fib w/ RVR;  c. CHA2DS2VASc= 1-->ASA.  Marland Kitchen Essential hypertension   . Hyperlipidemia   . History of echocardiogram     a. 03/29/2014: EF 55-60%, mild LVH, normal RVSP;  b. 05/2015 Echo: EF 60-65%, no rwma, triv AI, mild MR, mildly dil LA.  . Non-obstructive CAD     a. 01/27/2014 Cath: LM nl, LAD mild diff dz w/o obs, LCx no sig obs - scattered 20-30% mLCx, RCA no obs dz, EF 70%  . Claudication ([place])   . Erectile dysfunction     Past Surgical History  Procedure Laterality Date  . Cardiac catheterization    . Cardiac catheterization      [facility]  . Left heart cath Right 01/27/2014    Procedure: LEFT HEART CATH;  Surgeon: Blane Ohara, MD;  Location: [facility] CATH LAB;  Service: Cardiovascular;  Laterality: Right;    Family History  Problem Relation Age of Onset  . Coronary artery  disease Father 49   Social History:  reports that he has been smoking Cigarettes.  He has a 15 pack-year smoking history. He has never used smokeless tobacco. He reports that he does not drink alcohol or use illicit drugs.  Allergies: No Known Allergies  Prior to Admission medications   Medication Sig Start Date End Date Taking? Authorizing Provider  aspirin EC 81 MG tablet Take 81 mg by mouth 2 (two) times daily.     Historical Provider, MD  atorvastatin (LIPITOR) 40 MG tablet Take 1 tablet (40 mg total) by mouth daily. 04/22/15   Wellington Hampshire, MD  diltiazem (CARDIZEM) 30 MG tablet Take 1 tablet (30 mg total) by mouth 4 (four) times daily as needed (Every 6 hours as needed for palpitations). 06/11/15   Rogelia Mire, NP  metoprolol (LOPRESSOR) 100 MG tablet Take 1 tablet (100 mg total) by mouth 2 (two) times daily. 04/22/15   Wellington Hampshire, MD  sildenafil (VIAGRA) 25 MG tablet Take 1 tablet (25 mg total) by mouth daily as needed for erectile dysfunction. 01/22/15   Wellington Hampshire, MD     Results for orders placed or performed during the hospital encounter of 08/22/15 (from the past 48 hour(s))  Basic metabolic panel     Status: Abnormal   Collection Time: 08/22/15  1:41 AM  Result  Value Ref Range   Sodium 137 135 - 145 mmol/L   Potassium 3.8 3.5 - 5.1 mmol/L   Chloride 106 101 - 111 mmol/L   CO2 25 22 - 32 mmol/L   Glucose, Bld 103 (H) 65 - 99 mg/dL   BUN 17 6 - 20 mg/dL   Creatinine, Ser 1.10 0.61 - 1.24 mg/dL   Calcium 9.2 8.9 - 10.3 mg/dL   GFR calc non Af Amer >60 >60 mL/min   GFR calc Af Amer >60 >60 mL/min    Comment: (NOTE) The eGFR has been calculated using the CKD EPI equation. This calculation has not been validated in all clinical situations. eGFR's persistently <60 mL/min signify possible Chronic Kidney Disease.    Anion gap 6 5 - 15  CBC     Status: Abnormal   Collection Time: 08/22/15  1:41 AM  Result Value Ref Range   WBC 8.4 3.8 - 10.6 K/uL   RBC  4.81 4.40 - 5.90 MIL/uL   Hemoglobin 14.2 13.0 - 18.0 g/dL   HCT 42.6 40.0 - 52.0 %   MCV 88.4 80.0 - 100.0 fL   MCH 29.5 26.0 - 34.0 pg   MCHC 33.4 32.0 - 36.0 g/dL   RDW 16.2 (H) 11.5 - 14.5 %   Platelets 232 150 - 440 K/uL  Troponin I     Status: None   Collection Time: 08/22/15  1:41 AM  Result Value Ref Range   Troponin I <0.03 <0.031 ng/mL    Comment:        NO INDICATION OF MYOCARDIAL INJURY.    Dg Chest Port 1 View  08/22/2015  CLINICAL DATA:  60 year old male with dyspnea and palpitation EXAM: PORTABLE CHEST 1 VIEW COMPARISON:  Chest radiograph dated 05/22/2015 FINDINGS: The heart size and mediastinal contours are within normal limits. Both lungs are clear. The visualized skeletal structures are unremarkable. IMPRESSION: No active disease. Electronically Signed   By: Anner Crete M.D.   On: 08/22/2015 01:43    Review of Systems  Constitutional: Negative for fever and chills.  HENT: Negative for sore throat and tinnitus.   Eyes: Negative for blurred vision and redness.  Respiratory: Negative for cough and shortness of breath.   Cardiovascular: Positive for chest pain and palpitations. Negative for orthopnea and PND.  Gastrointestinal: Negative for nausea, vomiting, abdominal pain and diarrhea.  Genitourinary: Negative for dysuria, urgency and frequency.  Musculoskeletal: Negative for myalgias and joint pain.  Skin: Negative for rash.       No lesions  Neurological: Negative for speech change, focal weakness and weakness.  Endo/Heme/Allergies: Does not bruise/bleed easily.       No temperature intolerance  Psychiatric/Behavioral: Negative for depression and suicidal ideas.    Blood pressure 143/82, pulse 98, temperature 97.7 F (36.5 C), temperature source Oral, resp. rate 19, height _0  (1.676 m), weight 77.111 kg (170 lb), SpO2 98 %. Physical Exam  Constitutional: He is oriented to person, place, and time. He appears well-developed and well-nourished. No  distress.  HENT:  Head: Normocephalic and atraumatic.  Mouth/Throat: Oropharynx is clear and moist.  Eyes: Conjunctivae and EOM are normal. Pupils are equal, round, and reactive to light. No scleral icterus.  Neck: Normal range of motion. Neck supple. No JVD present. No tracheal deviation present. No thyromegaly present.  Cardiovascular: Regular rhythm and normal heart sounds.  Tachycardia present.   GI: Soft. Bowel sounds are normal. He exhibits no distension. There is no tenderness.  Genitourinary:  Deferred  Musculoskeletal: Normal range of motion. He exhibits no edema.  Lymphadenopathy:    He has no cervical adenopathy.  Neurological: He is alert and oriented to person, place, and time. No cranial nerve deficit.  Skin: Skin is warm and dry. No rash noted. No erythema.  Psychiatric: He has a normal mood and affect. His behavior is normal. Judgment and thought content normal.     Assessment/Plan This is a [age]-year-old male admitted for atrial fibrillation with rapid ventricular response and chest pain. 1. A. fib with rapid ventricular response: Rate has improved but is not yet controlled. Cardiology consulted for antiarrhythmic recommendations. Continue to monitor telemetry. 2. Chest pain: Likely secondary to demand ischemia due to persistent tachycardia. Continue to cycle cardiac enzymes. 3. Essential hypertension: Continue metoprolol and diltiazem 4. Tobacco abuse: The patient smokes half a pack per day. He opts not to use a NicoDerm patch while hospitalized. 5. DVT prophylaxis: Lovenox 6. GI prophylaxis: None The patient is a full code. Time spent on admission orders and patient care proximally 45 minutes  Harrie Foreman, MD 08/22/2015, 7:14 AM

## 2015-08-22 NOTE — Care Management (Signed)
Patient to be discharged on Xarelto. Printed a free $0 copay card and gave to patient. Instructed him to present it to the pharmacy. No additional needs identified. Case closed

## 2015-08-22 NOTE — ED Provider Notes (Signed)
[facility] Emergency Department Provider Note  ____________________________________________  Time seen: 1:45 AM  I have reviewed the triage vital signs and the nursing notes.   HISTORY  Chief Complaint Irregular Heart Beat; Palpitations; and Chest Pain      HPI Darren Rogers is a 60 y.o. male with history of atrial fibrillation presents via EMS with irregular heartbeat and chest pain 2 days patient states that he's had persistent left-sided chest pain radiation to his left arm that is worsened with exertion 2 days current pain score 5 out of 10. Patient also admits to palpitations and dizziness.     Past Medical History  Diagnosis Date  . Tobacco abuse     a. ongoing - 1 ppd.  . Paroxysmal atrial fibrillation (HCC)     a. s/p ? ablation procedure ~ 20 yrs ago ([facility]); b. very symptomatic when goes into a-fib w/ RVR;  c. CHA2DS2VASc= 1-->ASA.  Marland Kitchen Essential hypertension   . Hyperlipidemia   . History of echocardiogram     a. 03/29/2014: EF 55-60%, mild LVH, normal RVSP;  b. 05/2015 Echo: EF 60-65%, no rwma, triv AI, mild MR, mildly dil LA.  . Non-obstructive CAD     a. 01/27/2014 Cath: LM nl, LAD mild diff dz w/o obs, LCx no sig obs - scattered 20-30% mLCx, RCA no obs dz, EF 70%  . Claudication (HCC)   . Erectile dysfunction     Patient Active Problem List   Diagnosis Date Noted  . Claudication (HCC)   . Shortness of breath 04/22/2015  . 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 CVA (cerebral infarction) 11/16/2014  . CAD in native artery 04/11/2014  . History of echocardiogram   . Paroxysmal atrial fibrillation (HCC)   . STEMI (ST elevation myocardial infarction) (HCC) 01/29/2014  . Tobacco abuse   . Hyperlipidemia   . Hypertension   . Atrial fibrillation with rapid ventricular response (HCC) 01/27/2014    Past Surgical History  Procedure Laterality Date  . Cardiac catheterization    . [facility]iac [facility]      [facility]  . Left  heart cath Right 01/27/2014    Procedure: LEFT HEART CATH;  Surgeon: Micheline Chapman, MD;  Location: [facility] CATH LAB;  Service: Cardiovascular;  Laterality: Right;    Current Outpatient Rx  Name  Route  Sig  Dispense  Refill  . aspirin EC 81 MG tablet   Oral   Take 81 mg by mouth 2 (two) times daily.          Marland Kitchen atorvastatin (LIPITOR) 40 MG tablet   Oral   Take 1 tablet (40 mg total) by mouth daily.   30 tablet   5   . diltiazem (CARDIZEM) 30 MG tablet   Oral   Take 1 tablet (30 mg total) by mouth 4 (four) times daily as needed (Every 6 hours as needed for palpitations).   30 tablet   2   . metoprolol (LOPRESSOR) 100 MG tablet   Oral   Take 1 tablet (100 mg total) by mouth 2 (two) times daily.   60 tablet   6   . sildenafil (VIAGRA) 25 MG tablet   Oral   Take 1 tablet (25 mg total) by mouth daily as needed for erectile dysfunction.   8 tablet   3     Allergies No known drug allergies  Family History  Problem Relation Age of Onset  . Coronary artery disease Father 42    Social History Social  History  Substance Use Topics  . Smoking status: Current Every Day Smoker -- 0.50 packs/day for 30 years    Types: Cigarettes  . Smokeless tobacco: Never Used  . Alcohol Use: No    Review of Systems  Constitutional: Negative for fever. Eyes: Negative for visual changes. ENT: Negative for sore throat. Cardiovascular: Positive for chest pain and palpitations Respiratory: Negative for shortness of breath. Gastrointestinal: Negative for abdominal pain, vomiting and diarrhea. Genitourinary: Negative for dysuria. Musculoskeletal: Negative for back pain. Skin: Negative for rash. Neurological: Negative for headaches, focal weakness or numbness.   10-point ROS otherwise negative.  ____________________________________________   PHYSICAL EXAM:  VITAL SIGNS: ED Triage Vitals  Enc Vitals Group     BP 08/22/15 0105 141/105 mmHg     Pulse Rate 08/22/15 0105 153     Resp  08/22/15 0105 20     Temp 08/22/15 0105 97.7 F (36.5 C)     Temp Source 08/22/15 0105 Oral     SpO2 08/22/15 0105 98 %     Weight 08/22/15 0105 170 lb (77.111 kg)     Height 08/22/15 0105 5\' 6"  (1.676 m)     Head Cir --      Peak Flow --      Pain Score --      Pain Loc --      Pain Edu? --      Excl. in GC? --    Constitutional: Alert and oriented. Well appearing and in no distress. Eyes: Conjunctivae are normal. PERRL. Normal extraocular movements. ENT   Head: Normocephalic and atraumatic.   Nose: No congestion/rhinnorhea.   Mouth/Throat: Mucous membranes are moist.   Neck: No stridor. Hematological/Lymphatic/Immunilogical: No cervical lymphadenopathy. Cardiovascular:Irregular regular rhythm tachycardia Respiratory: Normal respiratory effort without tachypnea nor retractions. Breath sounds are clear and equal bilaterally. No wheezes/rales/rhonchi. Gastrointestinal: Soft and nontender. No distention. There is no CVA tenderness. Genitourinary: deferred Musculoskeletal: Nontender with normal range of motion in all extremities. No joint effusions.  No lower extremity tenderness nor edema. Neurologic:  Normal speech and language. No gross focal neurologic deficits are appreciated. Speech is normal.  Skin:  Skin is warm, dry and intact. No rash noted. Psychiatric: Mood and affect are normal. Speech and behavior are normal. Patient exhibits appropriate insight and judgment.  ____________________________________________    LABS (pertinent positives/negatives)  Labs Reviewed  BASIC METABOLIC PANEL - Abnormal; Notable for the following:    Glucose, Bld 103 (*)    All other components within normal limits  CBC - Abnormal; Notable for the following:    RDW 16.2 (*)    All other components within normal limits  TROPONIN I     ____________________________________________   EKG  ED ECG REPORT I, [place] Waylynn Benefiel, the attending physician, personally viewed and  interpreted this ECG.   Date: 08/22/2015  EKG Time: 1:24 AM  Rate: 103  Rhythm: Atrial fibrillation with rapid ventricular response  Axis: None  Intervals: Irregular  ST&T Change: None   ____________________________________________    RADIOLOGY   DG Chest Port 1 View (Final result) Result time: 08/22/15 01:43:50   Final result by Rad Results In Interface (08/22/15 01:43:50)   Narrative:   CLINICAL DATA: 60 year old male with dyspnea and palpitation  EXAM: PORTABLE CHEST 1 VIEW  COMPARISON: Chest radiograph dated 05/22/2015  FINDINGS: The heart size and mediastinal contours are within normal limits. Both lungs are clear. The visualized skeletal structures are unremarkable.  IMPRESSION: No active disease.   Electronically Signed  By: Elgie Collard M.D. On: 08/22/2015 01:43      Critical Care performed: CRITICAL CARE Performed by: Darci Current   Total critical care time: 30 minutes  Critical care time was exclusive of separately billable procedures and treating other patients.  Critical care was necessary to treat or prevent imminent or life-threatening deterioration.  Critical care was time spent personally by me on the following activities: development of treatment plan with patient and/or surrogate as well as nursing, discussions with consultants, evaluation of patient's response to treatment, examination of patient, obtaining history from patient or surrogate, ordering and performing treatments and interventions, ordering and review of laboratory studies, ordering and review of radiographic studies, pulse oximetry and re-evaluation of patient's condition.    ____________________________________________   INITIAL IMPRESSION / ASSESSMENT AND PLAN / ED COURSE  Pertinent labs & imaging results that were available during my care of the patient were reviewed by me and considered in my medical decision making (see chart for details).  Patient's  heart rate variable with highest rate of 172 EKG consistent with atrial fibrillation with rapid ventricular response as such patient received diltiazem 10 mg IV with resultant rate control atrial fibrillation. Given chest pain in the setting of atrial fibrillation with rapid ventricular response patient will be admitted to the hospital for further evaluation and management. Patient discussed with Dr. Sheryle Hail for admission  ____________________________________________   FINAL CLINICAL IMPRESSION(S) / ED DIAGNOSES  Final diagnoses:  Chest pain, unspecified chest pain type  Atrial fibrillation with rapid ventricular response [facility])      Darci Current, MD 08/22/15 [phone]

## 2015-08-22 NOTE — Progress Notes (Signed)
Converted from AFib to NSR.

## 2015-08-22 NOTE — Progress Notes (Signed)
Patient alert and oriented, vss.  No complaints of pain.  PIV and telemetry removed.    Converted to NSR, new medications include cardizem and xarelto.  Given patient education and patient able to repeat back understanding of new medications and f/u appt with [name] on 4/17.  No questions at this time.  Girlfriend picking up patient and he will be escorted out of hospital via wheelchair by volunteers.

## 2015-08-22 NOTE — Consult Note (Signed)
Cardiology Consultation Note  Patient ID: Darren Rogers, MRN: [id number], DOB/AGE: 61-Oct-1957 61 y.o. Admit date: 08/22/2015   Date of Consult: 08/22/2015 Primary Physician: No PCP Per Patient Primary Cardiologist: Dr. Kirke Corin, MD  Chief Complaint: Palpitations Reason for Consult: Afib with RVR  HPI: 60 y.o. male who presents with Afib with RVR. He has a h/o PAF not on long term full-dose anticoagulation given a CHADSVASc of 1 for HTN only s/p reported ablation approximately 20 years ago at [facility] though no details are available. He also has a history of nonobstructive CAD by cardiac cath in August 2015 in the setting of unstable angina and EKG changes worrisome for anterior ST elevation MI, HTN, tobacco abuse, HLD, and erectile dysfunction. At the time of his cardiac cath he was found to be in Afib with RVR which was treated with IV diltiazem. TEE/DCCV was planned bu he converted to NSR on his own. He had a similar presentation in 12/2014. At that time his metoprolol was increased to 100 mg bid. In January he was seen for exertional chest pain and palpitations s/p recent episode of Afib that required an ED visit. He converted to NSR with IV diltiazem. He was provided short-acting diltiazem to be used prn. Unfortunately he does not like to take this as he reports this leads to worsening ED, though he does report a good response with the medication. At his January office visit he was schedule for a treadmill Myoview but this was changed to pharmacologic given fatigue, chest pain, and leg pain. His Myoview on 06/26/2015 was low risk with no significant ischemia, EF 60%. He has also undergone lower extremity evaluation given his symptoms which showed diffuse nonobstructive disease.    He presented to [facility] on 3/22 with sudden onset of Afib with RVR that began on 3/21. He did take his prn diltiazem though it did not help this time. He notes tachy-palpitations and chest tightness if he is supine. Some SOB. Upon the  patient's arrival to [facility] they were found to have troponin negative x 1 to date, K+ 3.8, unremarkable CBC. ECG as below, CXR showed no active disease. He received IV Cardizem 10 mg x 1 with his HR improving to the low 100's.    Past Medical History  Diagnosis Date  . Tobacco abuse     a. ongoing - 1 ppd.  . Paroxysmal atrial fibrillation (HCC)     a. s/p ? ablation procedure ~ 20 yrs ago ([facility]); b. very symptomatic when goes into a-fib w/ RVR;  c. CHA2DS2VASc= 1-->ASA.  Marland Kitchen Essential hypertension   . Hyperlipidemia   . History of echocardiogram     a. 03/29/2014: EF 55-60%, mild LVH, normal RVSP;  b. 05/2015 Echo: EF 60-65%, no rwma, triv AI, mild MR, mildly dil LA.  . Non-obstructive CAD     a. 01/27/2014 Cath: LM nl, LAD mild diff dz w/o obs, LCx no sig obs - scattered 20-30% mLCx, RCA no obs dz, EF 70%  . Claudication (HCC)   . Erectile dysfunction       Most Recent Cardiac Studies: Echo 05/2015: Study Conclusions  - Left ventricle: The cavity size was normal. There was mild  concentric hypertrophy. Systolic function was normal. The  estimated ejection fraction was in the range of 60% to 65%. Wall  motion was normal; there were no regional wall motion  abnormalities. Left ventricular diastolic function parameters  were normal. - Aortic valve: There was trivial regurgitation. - Mitral valve: There was  mild regurgitation. - Left atrium: The atrium was mildly dilated.   Surgical History:  Past Surgical History  Procedure Laterality Date  . Cardiac catheterization    . Cardiac catheterization      [facility]  . Left heart cath Right 01/27/2014    Procedure: LEFT HEART CATH;  Surgeon: Micheline Chapman, MD;  Location: [facility] CATH LAB;  Service: Cardiovascular;  Laterality: Right;     Home Meds: Prior to Admission medications   Medication Sig Start Date End Date Taking? Authorizing Provider  aspirin EC 81 MG tablet Take 81 mg by mouth 2 (two) times daily.    Yes Historical Provider,  MD  atorvastatin (LIPITOR) 40 MG tablet Take 1 tablet (40 mg total) by mouth daily. 04/22/15  Yes Iran Ouch, MD  diltiazem (CARDIZEM) 30 MG tablet Take 30 mg by mouth 4 (four) times daily as needed (for palpitations).   Yes Historical Provider, MD  metoprolol (LOPRESSOR) 100 MG tablet Take 1 tablet (100 mg total) by mouth 2 (two) times daily. 04/22/15  Yes Iran Ouch, MD  sildenafil (VIAGRA) 25 MG tablet Take 1 tablet (25 mg total) by mouth daily as needed for erectile dysfunction. 01/22/15  Yes Iran Ouch, MD    Inpatient Medications:  . aspirin EC  81 mg Oral BID  . atorvastatin  40 mg Oral Daily  . docusate sodium  100 mg Oral BID  . enoxaparin (LOVENOX) injection  1 mg/kg Subcutaneous Q12H  . metoprolol  100 mg Oral BID  . sodium chloride flush  3 mL Intravenous Q12H      Allergies: No Known Allergies  Social History   Social History  . Marital Status: Single    Spouse Name: N/A  . Number of Children: N/A  . Years of Education: N/A   Occupational History  . Not on file.   Social History Main Topics  . Smoking status: Current Every Day Smoker -- 0.50 packs/day for 30 years    Types: Cigarettes  . Smokeless tobacco: Never Used  . Alcohol Use: No  . Drug Use: No  . Sexual Activity: Not on file   Other Topics Concern  . Not on file   Social History Narrative   The patient lives in [place] with his sister. He works in a substance abuse program. 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 He is a long-time smoker, one pack per day. No illicit drugs. He is not married.     Family History  Problem Relation Age of Onset  . Coronary artery disease Father 45     Review of Systems: Review of Systems  Constitutional: Positive for malaise/fatigue. Negative for fever, chills, weight loss and diaphoresis.  HENT: Negative for congestion.   Eyes: Negative for discharge and redness.  Respiratory: Positive for shortness of  breath. Negative for cough, hemoptysis, sputum production and wheezing.   Cardiovascular: Positive for chest pain and palpitations. Negative for orthopnea, claudication, leg swelling and PND.  Gastrointestinal: Negative for nausea, vomiting and abdominal pain.  Musculoskeletal: Negative for myalgias and falls.  Skin: Negative for rash.  Neurological: Positive for weakness. Negative for dizziness, tingling, tremors, sensory change, speech change and loss of consciousness.  Endo/Heme/Allergies: Does not bruise/bleed easily.  Psychiatric/Behavioral: The patient is not nervous/anxious.   All other systems reviewed and are negative.   Labs:  Recent Labs  08/22/15 0141  TROPONINI <0.03   Lab Results  Component Value Date  WBC 8.4 08/22/2015   HGB 14.2 08/22/2015   HCT 42.6 08/22/2015   MCV 88.4 08/22/2015   PLT 232 08/22/2015    Recent Labs Lab 08/22/15 0141  NA 137  K 3.8  CL 106  CO2 25  BUN 17  CREATININE 1.10  CALCIUM 9.2  GLUCOSE 103*   Lab Results  Component Value Date   CHOL 150 03/04/2015   HDL 42 03/04/2015   LDLCALC 92 03/04/2015   TRIG 80 03/04/2015   No results found for: DDIMER  Radiology/Studies:  Dg Chest Port 1 View  08/22/2015  CLINICAL DATA:  60 year old male with dyspnea and palpitation EXAM: PORTABLE CHEST 1 VIEW COMPARISON:  Chest radiograph dated 05/22/2015 FINDINGS: The heart size and mediastinal contours are within normal limits. Both lungs are clear. The visualized skeletal structures are unremarkable. IMPRESSION: No active disease. Electronically Signed   By: Elgie Collard M.D.   On: 08/22/2015 01:43    EKG: Afib with RVR, 103 bpm, baseline artifact and wandering, anterior nonspecific st changes (old), inferolateral TWI (old)  Weights: Filed Weights   08/22/15 0105 08/22/15 0824  Weight: 170 lb (77.111 kg) 164 lb 12.8 oz (74.753 kg)     Physical Exam: Blood pressure 137/81, pulse 91, temperature 97.8 F (36.6 C), temperature  source Oral, resp. rate 17, height 5\' 6"  (1.676 m), weight 164 lb 12.8 oz (74.753 kg), SpO2 99 %. Body mass index is 26.61 kg/(m^2). General: Well developed, well nourished, in no acute distress. Head: Normocephalic, atraumatic, sclera non-icteric, no xanthomas, nares are without discharge.  Neck: Negative for carotid bruits. JVD not elevated. Lungs: Clear bilaterally to auscultation without wheezes, rales, or rhonchi. Breathing is unlabored. Heart: Irregularly irregular with S1 S2. No murmurs, rubs, or gallops appreciated. Abdomen: Soft, non-tender, non-distended with normoactive bowel sounds. No hepatomegaly. No rebound/guarding. No obvious abdominal masses. Msk:  Strength and tone appear normal for age. Extremities: No clubbing or cyanosis. No edema.  Distal pedal pulses are 2+ and equal bilaterally. Neuro: Alert and oriented X 3. No facial asymmetry. No focal deficit. Moves all extremities spontaneously. Psych:  Responds to questions appropriately with a normal affect.    Assessment and Plan:   1. PAF with RVR: -Add Cardizem 120 mg daily -Continue Lopressor 100 mg bid -Given he has been in Afib with RVR for greater than 48 hours there is risk of LAA clot  -Discontinue aspirin and start Xarelto 20 mg q dinner, risks and benefits were discussed with him -He may continue his prn diltiazem for breakthrough tachy-palpitations  -Should he convert to sinus rhythm he is to call our office for EKG and possible medication management  -If he remains in Afib he may need either further medication management with antiarrhythmic vs DCCV s/p 3 weeks of anticoagulation vs repeat ablation s/p EP evaluation  -He denies any sleep apnea symptoms  -Weight loss would help  2. ED: -He remains fixated on this -Follow up with PCP  3. Chest pain: -Only with Afib with RVR -Recent nonischemic nuclear stress test -None currently -No further work up at this time  4. HTN: -Medications as above  5. Tobacco  abuse: -Cessation advised   Elinor Dodge, PA-C Pager: [phone] 08/22/2015, 10:12 AM

## 2015-08-23 ENCOUNTER — Emergency Department: Payer: 59

## 2015-08-23 ENCOUNTER — Telehealth: Payer: Self-pay | Admitting: Cardiovascular Disease

## 2015-08-23 ENCOUNTER — Observation Stay
Admission: EM | Admit: 2015-08-23 | Discharge: 2015-08-24 | Disposition: A | Discharge status: Home / Self Care | Payer: 59 | Attending: Specialist | Admitting: Specialist

## 2015-08-23 DIAGNOSIS — Z8781 Personal history of (healed) traumatic fracture: Secondary | ICD-10-CM | POA: Diagnosis not present

## 2015-08-23 DIAGNOSIS — I10 Essential (primary) hypertension: Secondary | ICD-10-CM | POA: Diagnosis present

## 2015-08-23 DIAGNOSIS — Z8249 Family history of ischemic heart disease and other diseases of the circulatory system: Secondary | ICD-10-CM | POA: Insufficient documentation

## 2015-08-23 DIAGNOSIS — R748 Abnormal levels of other serum enzymes: Secondary | ICD-10-CM | POA: Diagnosis not present

## 2015-08-23 DIAGNOSIS — I480 Paroxysmal atrial fibrillation: Secondary | ICD-10-CM | POA: Diagnosis present

## 2015-08-23 DIAGNOSIS — Z79899 Other long term (current) drug therapy: Secondary | ICD-10-CM | POA: Diagnosis not present

## 2015-08-23 DIAGNOSIS — R0602 Shortness of breath: Secondary | ICD-10-CM | POA: Diagnosis not present

## 2015-08-23 DIAGNOSIS — R9431 Abnormal electrocardiogram [ECG] [EKG]: Secondary | ICD-10-CM

## 2015-08-23 DIAGNOSIS — Z7901 Long term (current) use of anticoagulants: Secondary | ICD-10-CM | POA: Diagnosis not present

## 2015-08-23 DIAGNOSIS — E785 Hyperlipidemia, unspecified: Secondary | ICD-10-CM | POA: Diagnosis not present

## 2015-08-23 DIAGNOSIS — I739 Peripheral vascular disease, unspecified: Secondary | ICD-10-CM | POA: Diagnosis not present

## 2015-08-23 DIAGNOSIS — R002 Palpitations: Secondary | ICD-10-CM | POA: Diagnosis not present

## 2015-08-23 DIAGNOSIS — F17210 Nicotine dependence, cigarettes, uncomplicated: Secondary | ICD-10-CM | POA: Insufficient documentation

## 2015-08-23 DIAGNOSIS — R079 Chest pain, unspecified: Secondary | ICD-10-CM | POA: Diagnosis not present

## 2015-08-23 DIAGNOSIS — I2510 Atherosclerotic heart disease of native coronary artery without angina pectoris: Secondary | ICD-10-CM | POA: Diagnosis not present

## 2015-08-23 DIAGNOSIS — R7989 Other specified abnormal findings of blood chemistry: Secondary | ICD-10-CM

## 2015-08-23 DIAGNOSIS — R0789 Other chest pain: Secondary | ICD-10-CM | POA: Diagnosis present

## 2015-08-23 DIAGNOSIS — R001 Bradycardia, unspecified: Secondary | ICD-10-CM | POA: Insufficient documentation

## 2015-08-23 DIAGNOSIS — R778 Other specified abnormalities of plasma proteins: Secondary | ICD-10-CM

## 2015-08-23 LAB — COMPREHENSIVE METABOLIC PANEL
ALBUMIN: 3.9 g/dL (ref 3.5–5.0)
ALK PHOS: 97 U/L (ref 38–126)
ALT: 26 U/L (ref 17–63)
ANION GAP: 5 (ref 5–15)
AST: 33 U/L (ref 15–41)
BILIRUBIN TOTAL: 0.3 mg/dL (ref 0.3–1.2)
BUN: 17 mg/dL (ref 6–20)
CALCIUM: 8.6 mg/dL — AB (ref 8.9–10.3)
CO2: 24 mmol/L (ref 22–32)
CREATININE: 1.2 mg/dL (ref 0.61–1.24)
Chloride: 108 mmol/L (ref 101–111)
GFR calc Af Amer: >60 mL/min (ref >60)
GFR calc non Af Amer: >60 mL/min (ref >60)
GLUCOSE: 87 mg/dL (ref 65–99)
Potassium: 3.5 mmol/L (ref 3.5–5.1)
Sodium: 137 mmol/L (ref 135–145)
TOTAL PROTEIN: 7 g/dL (ref 6.5–8.1)

## 2015-08-23 LAB — CBC WITH DIFFERENTIAL/PLATELET
BASOS PCT: 1 %
Basophils Absolute: 0 10*3/uL (ref 0–0.1)
EOS ABS: 0.1 10*3/uL (ref 0–0.7)
EOS PCT: 1 %
HCT: 40.3 % (ref 40.0–52.0)
Hemoglobin: 13.4 g/dL (ref 13.0–18.0)
LYMPHS ABS: 2.7 10*3/uL (ref 1.0–3.6)
Lymphocytes Relative: 31 %
MCH: 29.4 pg (ref 26.0–34.0)
MCHC: 33.2 g/dL (ref 32.0–36.0)
MCV: 88.4 fL (ref 80.0–100.0)
MONO ABS: 0.7 10*3/uL (ref 0.2–1.0)
MONOS PCT: 8 %
NEUTROS PCT: 59 %
Neutro Abs: 5.1 10*3/uL (ref 1.4–6.5)
PLATELETS: 222 10*3/uL (ref 150–440)
RBC: 4.56 MIL/uL (ref 4.40–5.90)
RDW: 15.7 % — AB (ref 11.5–14.5)
WBC: 8.8 10*3/uL (ref 3.8–10.6)

## 2015-08-23 LAB — TROPONIN I
TROPONIN I: 0.3 ng/mL — AB (ref <0.031)
Troponin I: 0.11 ng/mL — ABNORMAL HIGH (ref <0.031)

## 2015-08-23 MED ORDER — MORPHINE SULFATE (PF) 2 MG/ML IV SOLN: 2.0000 mg | INTRAVENOUS | Status: DC | PRN

## 2015-08-23 MED ORDER — ACETAMINOPHEN 325 MG PO TABS: 650.0000 mg | ORAL_TABLET | Freq: Four times a day (QID) | ORAL | Status: DC | PRN

## 2015-08-23 MED ORDER — ATORVASTATIN CALCIUM 20 MG PO TABS
40.0000 mg | ORAL_TABLET | Freq: Every day | ORAL | Status: DC
  Administered 2015-08-24: 40 mg via ORAL
  Filled 2015-08-23: qty 2

## 2015-08-23 MED ORDER — ONDANSETRON HCL 4 MG/2ML IJ SOLN: 4.0000 mg | Freq: Four times a day (QID) | INTRAMUSCULAR | Status: DC | PRN

## 2015-08-23 MED ORDER — INFLUENZA VAC SPLIT QUAD 0.5 ML IM SUSY: 0.5000 mL | PREFILLED_SYRINGE | INTRAMUSCULAR | Status: DC

## 2015-08-23 MED ORDER — ACETAMINOPHEN 650 MG RE SUPP: 650.0000 mg | Freq: Four times a day (QID) | RECTAL | Status: DC | PRN

## 2015-08-23 MED ORDER — METOPROLOL TARTRATE 100 MG PO TABS
100.0000 mg | ORAL_TABLET | Freq: Two times a day (BID) | ORAL | Status: DC
  Administered 2015-08-24: 100 mg via ORAL
  Filled 2015-08-23: qty 1

## 2015-08-23 MED ORDER — RIVAROXABAN 20 MG PO TABS: 20.0000 mg | ORAL_TABLET | Freq: Every day | ORAL | Status: DC

## 2015-08-23 MED ORDER — ONDANSETRON HCL 4 MG PO TABS: 4.0000 mg | ORAL_TABLET | Freq: Four times a day (QID) | ORAL | Status: DC | PRN

## 2015-08-23 MED ORDER — DILTIAZEM HCL ER COATED BEADS 120 MG PO CP24
120.0000 mg | ORAL_CAPSULE | Freq: Every day | ORAL | Status: DC
  Administered 2015-08-24: 120 mg via ORAL
  Filled 2015-08-23: qty 1

## 2015-08-23 NOTE — ED Notes (Signed)
Pt continues to deny pain.

## 2015-08-23 NOTE — ED Notes (Signed)
Pt continues to deny pain, denies nauesa, shob. Pt states "i really want to go home."

## 2015-08-23 NOTE — ED Notes (Signed)
Pt arrived from home via EMS c/o chest pain. Per EMS pt has hx of afib, called today because of sever chest pain, pt diaphoretic, HR 212. EMS reported giving 120mg  of cardizem po, and 10 mg of cardizem IV, converting the pt back to NSR, HR 80.

## 2015-08-23 NOTE — ED Notes (Addendum)
CRITICAL TROPONIN: 0.11, April RN and Dr Darnelle Catalan notified

## 2015-08-23 NOTE — ED Provider Notes (Signed)
[facility] Emergency Department Provider Note  ____________________________________________  Time seen: Approximately 6:34 PM  I have reviewed the triage vital signs and the nursing notes.   HISTORY  Chief Complaint Chest Pain    HPI Darren Rogers is a 60 y.o. male patient recently discharged from the hospital for chest pain and A. fib had an episode of A. fib today just after he took his Xarelto came on very fast with chest pain which is unusual for him. He says he often gets A. fib but usually does not have chest pain with it. Patient reports he feels fine now he took a diltiazem at home and then EMS gave him 10 of diltiazem IV which converted him. Again patient now feels fine no chest pain or shortness of breath no other complaints.   Past Medical History  Diagnosis Date  . Tobacco abuse     a. ongoing - 1 ppd.  . Paroxysmal atrial fibrillation (HCC)     a. s/p ? ablation procedure ~ 20 yrs ago ([facility]); b. very symptomatic when goes into a-fib w/ RVR;  c. CHA2DS2VASc= 1-->ASA.  Marland Kitchen Essential hypertension   . Hyperlipidemia   . History of echocardiogram     a. 03/29/2014: EF 55-60%, mild LVH, normal RVSP;  b. 05/2015 Echo: EF 60-65%, no rwma, triv AI, mild MR, mildly dil LA.  . Non-obstructive CAD     a. 01/27/2014 Cath: LM nl, LAD mild diff dz w/o obs, LCx no sig obs - scattered 20-30% mLCx, RCA no obs dz, EF 70%  . Claudication (HCC)   . Erectile dysfunction     Patient Active Problem List   Diagnosis Date Noted  . Chest pain 08/22/2015  . Smoker   . Essential hypertension   . Claudication (HCC)   . Shortness of breath 04/22/2015  . 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 CVA (cerebral infarction) 11/16/2014  . CAD in native artery 04/11/2014  . History of echocardiogram   . Paroxysmal atrial fibrillation (HCC)   . STEMI (ST elevation myocardial infarction) (HCC) 01/29/2014  . Tobacco abuse   . Hyperlipidemia   . Atrial fibrillation  with rapid ventricular response (HCC) 01/27/2014    Past Surgical History  Procedure Laterality Date  . Cardiac catheterization    . Cardiac catheterization      [facility]  . Left heart cath Right 01/27/2014    Procedure: LEFT HEART CATH;  Surgeon: Micheline Chapman, MD;  Location: [facility] CATH LAB;  Service: Cardiovascular;  Laterality: Right;    Current Outpatient Rx  Name  Route  Sig  Dispense  Refill  . atorvastatin (LIPITOR) 40 MG tablet   Oral   Take 1 tablet (40 mg total) by mouth daily.   30 tablet   5   . diltiazem (CARDIZEM CD) 120 MG 24 hr capsule   Oral   Take 1 capsule (120 mg total) by mouth daily.   30 capsule   2   . metoprolol (LOPRESSOR) 100 MG tablet   Oral   Take 1 tablet (100 mg total) by mouth 2 (two) times daily.   60 tablet   6   . rivaroxaban (XARELTO) 20 MG TABS tablet   Oral   Take 1 tablet (20 mg total) by mouth daily with supper.   30 tablet   2   . sildenafil (VIAGRA) 25 MG tablet   Oral   Take 1 tablet (25 mg total) by mouth daily as needed for erectile dysfunction.  8 tablet   3     Allergies Review of patient's allergies indicates no known allergies.  Family History  Problem Relation Age of Onset  . Coronary artery disease Father 62    Social History Social History  Substance Use Topics  . Smoking status: Current Every Day Smoker -- 0.50 packs/day for 30 years    Types: Cigarettes  . Smokeless tobacco: Never Used  . Alcohol Use: No    Review of Systems Constitutional: No fever/chills Eyes: No visual changes. ENT: No sore throat. Cardiovascular: See history of present illness Respiratory: Denies shortness of breath. Gastrointestinal: No abdominal pain.  No nausea, no vomiting.  No diarrhea.  No constipation. Genitourinary: Negative for dysuria. Musculoskeletal: Negative for back pain. Skin: Negative for rash. Neurological: Negative for headaches, focal weakness or numbness.  10-point ROS otherwise  negative.  ____________________________________________   PHYSICAL EXAM:  VITAL SIGNS: ED Triage Vitals  Enc Vitals Group     BP --      Pulse --      Resp --      Temp --      Temp src --      SpO2 08/23/15 1832 96 %     Weight --      Height --      Head Cir --      Peak Flow --      Pain Score --      Pain Loc --      Pain Edu? --      Excl. in GC? --     Constitutional: Alert and oriented. Well appearing and in no acute distress. Eyes: Conjunctivae are normal. PERRL. EOMI. Head: Atraumatic. Nose: No congestion/rhinnorhea. Mouth/Throat: Mucous membranes are moist.  Oropharynx non-erythematous. Neck: No stridor.   Cardiovascular: Normal rate, regular rhythm. Grossly normal heart sounds.  Good peripheral circulation. Respiratory: Normal respiratory effort.  No retractions. Lungs CTAB. Gastrointestinal: Soft and nontender. No distention. No abdominal bruits. No CVA tenderness. Musculoskeletal: No lower extremity tenderness nor edema.  No joint effusions. Neurologic:  Normal speech and language. No gross focal neurologic deficits are appreciated. No gait instability. Skin:  Skin is warm, dry and intact. No rash noted. Psychiatric: Mood and affect are normal. Speech and behavior are normal.  ____________________________________________   LABS (all labs ordered are listed, but only abnormal results are displayed)  Labs Reviewed  COMPREHENSIVE METABOLIC PANEL - Abnormal; Notable for the following:    Calcium 8.6 (*)    All other components within normal limits  TROPONIN I - Abnormal; Notable for the following:    Troponin I 0.11 (*)    All other components within normal limits  CBC WITH DIFFERENTIAL/PLATELET - Abnormal; Notable for the following:    RDW 15.7 (*)    All other components within normal limits  TROPONIN I - Abnormal; Notable for the following:    Troponin I 0.30 (*)    All other components within normal limits    ____________________________________________  EKG  EKG #1 read and interpreted by me shows sinus rhythm at a rate of 76 normal axis there is T-wave inversion with ST segment depression inferiorly and laterally in 1 to 3L or 5 and 6 and also V3 this appears to be worse than previous EKGs from January and just before that. EKG was repeated about 45 minutes after the first one this shows what appears to be worse ST segment depression and T-wave inversion especially worse in lead 3 now there has  been ST elevation in V1 and V2 but this was present in January and on a previous one in March. That really does not appear to have changed. ____________________________________________  RADIOLOGY  Radiologist reads chest x-ray as no acute disease ____________________________________________   PROCEDURES    ____________________________________________   INITIAL IMPRESSION / ASSESSMENT AND PLAN / ED COURSE  Pertinent labs & imaging results that were available during my care of the patient were reviewed by me and considered in my medical decision making (see chart for details).  Initial troponin is elevated EKG is abnormal discussed the patient with the cardiologist on-call he says at present he thinks this could be demand ischemia since R rate was up to 100. I told discussed with the cardiologist that the fact that the patient was seen here just believe yesterday with negative troponins and EKG that didn't look quite as bad and heart rate that was similar 180 he thinks this still could be demand ischemia recommends holding off on aspirin and heparin repeating the troponin see if it goes up higher if it does then he would treat with aspirin and heparin etc. otherwise he would not he would be okay with the patient going home for observation.  Second troponin comes back 0.3 spoke with cardiologist again he advises admitting the patient to observe him we will not need to anticoagulate him since he  started taking Xarelto ____________________________________________   FINAL CLINICAL IMPRESSION(S) / ED DIAGNOSES  Final diagnoses:  Elevated troponin  Abnormal EKG      Arnaldo Natal, MD 08/23/15 2147

## 2015-08-23 NOTE — Telephone Encounter (Signed)
Subject: tcm/ph                      4/17  Dr Kirke Corin 3:30  Patient contacted regarding discharge from [facility] on March 23.  Patient understands to follow up with provider Kirke Corin on April 17 at 3:30 at [facility]. Patient understands discharge instructions? yes Patient understands medications and regiment? yes Patient understands to bring all medications to this visit? yes

## 2015-08-23 NOTE — ED Notes (Signed)
Pt updated on treatment plan. Pt verbalizes understanding.  

## 2015-08-23 NOTE — H&P (Signed)
[facility] - [place] at [facility]   PATIENT NAME: Darren Rogers    MR#:  [id number]  DATE OF BIRTH:  06-13-1955  DATE OF ADMISSION:  08/23/2015  PRIMARY CARE PHYSICIAN: No PCP Per Patient   REQUESTING/REFERRING PHYSICIAN: Darnelle Catalan, MD  CHIEF COMPLAINT:   Chief Complaint  Patient presents with  . Chest Pain    HISTORY OF PRESENT ILLNESS:  Darren Rogers  is a 60 y.o. male who presents with Palpitations and chest pain. Patient was just admitted here 2 days ago with atrial fibrillation with RVR, and discharged yesterday. He comes back again tonight after episode of significant palpitations with severe chest discomfort. He states that the discomfort abated when he was given IV Cardizem by EMS. Here in the ED he was noted to be in sinus rhythm, but his troponin was elevated at 0.1, and then subsequently 0.3. ED physician spoke with his cardiologist, Dr. Kirke Corin, who recommended monitor him overnight and trend his enzymes with cardiology consult the morning. Hospitalists were called for admission  PAST MEDICAL HISTORY:   Past Medical History  Diagnosis Date  . Tobacco abuse     a. ongoing - 1 ppd.  . Paroxysmal atrial fibrillation (HCC)     a. s/p ? ablation procedure ~ 20 yrs ago ([facility]); b. very symptomatic when goes into a-fib w/ RVR;  c. CHA2DS2VASc= 1-->ASA.  Marland Kitchen Essential hypertension   . Hyperlipidemia   . History of echocardiogram     a. 03/29/2014: EF 55-60%, mild LVH, normal RVSP;  b. 05/2015 Echo: EF 60-65%, no rwma, triv AI, mild MR, mildly dil LA.  . Non-obstructive CAD     a. 01/27/2014 Cath: LM nl, LAD mild diff dz w/o obs, LCx no sig obs - scattered 20-30% mLCx, RCA no obs dz, EF 70%  . Claudication (HCC)   . Erectile dysfunction     PAST SURGICAL HISTORY:   Past Surgical History  Procedure Laterality Date  . Cardiac catheterization    . Cardiac catheterization      [facility]  . Left heart cath Right 01/27/2014    Procedure: LEFT HEART CATH;   Surgeon: Micheline Chapman, MD;  Location: [facility] CATH LAB;  Service: Cardiovascular;  Laterality: Right;    SOCIAL HISTORY:   Social History  Substance Use Topics  . Smoking status: Current Every Day Smoker -- 0.50 packs/day for 30 years    Types: Cigarettes  . Smokeless tobacco: Never Used  . Alcohol Use: No    FAMILY HISTORY:   Family History  Problem Relation Age of Onset  . Coronary artery disease Father 21    DRUG ALLERGIES:  No Known Allergies  MEDICATIONS AT HOME:   Prior to Admission medications   Medication Sig Start Date End Date Taking? Authorizing Provider  atorvastatin (LIPITOR) 40 MG tablet Take 1 tablet (40 mg total) by mouth daily. 04/22/15  Yes Iran Ouch, MD  diltiazem (CARDIZEM CD) 120 MG 24 hr capsule Take 1 capsule (120 mg total) by mouth daily. 08/22/15  Yes Enedina Finner, MD  metoprolol (LOPRESSOR) 100 MG tablet Take 1 tablet (100 mg total) by mouth 2 (two) times daily. 04/22/15  Yes Iran Ouch, MD  rivaroxaban (XARELTO) 20 MG TABS tablet Take 1 tablet (20 mg total) by mouth daily with supper. 08/22/15  Yes Enedina Finner, MD  sildenafil (VIAGRA) 25 MG tablet Take 1 tablet (25 mg total) by mouth daily as needed for erectile dysfunction. 01/22/15  Yes Iran Ouch,  MD    REVIEW OF SYSTEMS:  Review of Systems  Constitutional: Negative for fever, chills, weight loss and malaise/fatigue.  HENT: Negative for ear pain, hearing loss and tinnitus.   Eyes: Negative for blurred vision, double vision, pain and redness.  Respiratory: Negative for cough, hemoptysis and shortness of breath.   Cardiovascular: Positive for chest pain and palpitations. Negative for orthopnea and leg swelling.  Gastrointestinal: Negative for nausea, vomiting, abdominal pain, diarrhea and constipation.  Genitourinary: Negative for dysuria, frequency and hematuria.  Musculoskeletal: Negative for back pain, joint pain and neck pain.  Skin:       No acne, rash, or lesions   Neurological: Negative for dizziness, tremors, focal weakness and weakness.  Endo/Heme/Allergies: Negative for polydipsia. Does not bruise/bleed easily.  Psychiatric/Behavioral: Negative for depression. The patient is not nervous/anxious and does not have insomnia.      VITAL SIGNS:   Filed Vitals:   08/23/15 2000 08/23/15 2030 08/23/15 2100 08/23/15 2130  BP: 130/85 129/79 122/75 130/80  Pulse: 72 73 72 67  Temp:      TempSrc:      Resp: 16 18 19 19   Height:      Weight:      SpO2: 98% 97% 97% 97%   Wt Readings from Last 3 Encounters:  08/23/15 74.39 kg (164 lb)  08/22/15 74.753 kg (164 lb 12.8 oz)  07/29/15 76.658 kg (169 lb)    PHYSICAL EXAMINATION:  Physical Exam  Vitals reviewed. Constitutional: He is oriented to person, place, and time. He appears well-developed and well-nourished. No distress.  HENT:  Head: Normocephalic and atraumatic.  Mouth/Throat: Oropharynx is clear and moist.  Eyes: Conjunctivae and EOM are normal. Pupils are equal, round, and reactive to light. No scleral icterus.  Neck: Normal range of motion. Neck supple. No JVD present. No thyromegaly present.  Cardiovascular: Normal rate, regular rhythm and intact distal pulses.  Exam reveals no gallop and no friction rub.   No murmur heard. Respiratory: Effort normal and breath sounds normal. No respiratory distress. He has no wheezes. He has no rales.  GI: Soft. Bowel sounds are normal. He exhibits no distension. There is no tenderness.  Musculoskeletal: Normal range of motion. He exhibits no edema.  No arthritis, no gout  Lymphadenopathy:    He has no cervical adenopathy.  Neurological: He is alert and oriented to person, place, and time. No cranial nerve deficit.  No dysarthria, no aphasia  Skin: Skin is warm and dry. No rash noted. No erythema.  Psychiatric: He has a normal mood and affect. His behavior is normal. Judgment and thought content normal.    LABORATORY PANEL:   CBC  Recent  Labs Lab 08/23/15 1833  WBC 8.8  HGB 13.4  HCT 40.3  PLT 222   ------------------------------------------------------------------------------------------------------------------  Chemistries   Recent Labs Lab 08/23/15 1833  NA 137  K 3.5  CL 108  CO2 24  GLUCOSE 87  BUN 17  CREATININE 1.20  CALCIUM 8.6*  AST 33  ALT 26  ALKPHOS 97  BILITOT 0.3   ------------------------------------------------------------------------------------------------------------------  Cardiac Enzymes  Recent Labs Lab 08/23/15 2047  TROPONINI 0.30*   ------------------------------------------------------------------------------------------------------------------  RADIOLOGY:  Dg Chest Portable 1 View  08/23/2015  CLINICAL DATA:  Irregular heartbeat, chest pain, and shortness of breath since Tuesday. Chest pain radiates to the left arm. EXAM: PORTABLE CHEST 1 VIEW COMPARISON:  08/22/2015 FINDINGS: Normal heart size and pulmonary vascularity. Central interstitial changes suggesting chronic bronchitis. Linear scarring in the left mid  lung. No focal airspace disease or consolidation. No blunting of costophrenic angles. No pneumothorax. Mediastinal contours appear intact. Old right rib fractures. IMPRESSION: Chronic bronchitic changes in the lungs. No evidence of active pulmonary disease. Electronically Signed   By: Burman Nieves M.D.   On: 08/23/2015 18:51   Dg Chest Port 1 View  08/22/2015  CLINICAL DATA:  60 year old male with dyspnea and palpitation EXAM: PORTABLE CHEST 1 VIEW COMPARISON:  Chest radiograph dated 05/22/2015 FINDINGS: The heart size and mediastinal contours are within normal limits. Both lungs are clear. The visualized skeletal structures are unremarkable. IMPRESSION: No active disease. Electronically Signed   By: Elgie Collard M.D.   On: 08/22/2015 01:43    EKG:   Orders placed or performed during the hospital encounter of 08/23/15  . EKG 12-Lead  . EKG 12-Lead  . ED EKG   . ED EKG  . EKG 12-Lead  . EKG 12-Lead  . Repeat EKG  . Repeat EKG  . EKG 12-Lead  . EKG 12-Lead    IMPRESSION AND PLAN:  Principal Problem:   Chest pain - occurred in conjunction with significant palpitations. Patient has a history of paroxysmal A. fib, with recent RVR. They said his heart rate when EMS arrived tonight was in the 200s. Question possibly flutter. Currently chest pain-free. Troponins elevated. We'll trend serially tonight, and get a cardiology consult. Active Problems:   Paroxysmal atrial fibrillation (HCC) - currently in sinus rhythm, continue home rate controlling medications, as well as anticoagulation   CAD in native artery - continue home meds   Essential hypertension - continue home meds   Hyperlipidemia - continue home meds  All the records are reviewed and case discussed with ED provider. Management plans discussed with the patient and/or family.  DVT PROPHYLAXIS: Systemic anticoagulation  GI PROPHYLAXIS: None  ADMISSION STATUS: Observation  CODE STATUS: Full Code Status History    Date Active Date Inactive Code Status Order ID Comments User Context   08/22/2015  7:36 AM 08/22/2015  7:39 PM Full Code [id number]  Arnaldo Natal, MD ED   11/16/2014 12:12 PM 11/17/2014  1:57 PM Full Code [id number]  Alford Highland, MD ED   01/27/2014  4:26 PM 01/29/2014  4:05 PM Full Code [id number]  Tonny Bollman, MD Inpatient      TOTAL TIME TAKING CARE OF THIS PATIENT: 40 minutes.    Sherra Kimmons FIELDING 08/23/2015, 9:55 PM  [facility]  Office  [phone]  CC: Primary care physician; No PCP Per Patient

## 2015-08-23 NOTE — ED Notes (Signed)
Dr. Darnelle Catalan notified of troponin of 0.11.

## 2015-08-23 NOTE — ED Notes (Signed)
No critical lab called from lab. Dr. Darnelle Catalan notified of troponin repeat level via value in written chart by this rn.

## 2015-08-23 NOTE — ED Notes (Signed)
Report from jerri, rn.  

## 2015-08-24 DIAGNOSIS — I209 Angina pectoris, unspecified: Secondary | ICD-10-CM

## 2015-08-24 DIAGNOSIS — R9431 Abnormal electrocardiogram [ECG] [EKG]: Secondary | ICD-10-CM | POA: Diagnosis not present

## 2015-08-24 DIAGNOSIS — R7989 Other specified abnormal findings of blood chemistry: Secondary | ICD-10-CM

## 2015-08-24 DIAGNOSIS — I480 Paroxysmal atrial fibrillation: Secondary | ICD-10-CM | POA: Diagnosis not present

## 2015-08-24 LAB — TROPONIN I
Troponin I: 0.26 ng/mL — ABNORMAL HIGH (ref <0.031)
Troponin I: 0.39 ng/mL — ABNORMAL HIGH (ref <0.031)
Troponin I: 0.52 ng/mL — ABNORMAL HIGH (ref <0.031)

## 2015-08-24 LAB — CBC
HEMATOCRIT: 40.2 % (ref 40.0–52.0)
HEMOGLOBIN: 13.4 g/dL (ref 13.0–18.0)
MCH: 29.1 pg (ref 26.0–34.0)
MCHC: 33.3 g/dL (ref 32.0–36.0)
MCV: 87.6 fL (ref 80.0–100.0)
Platelets: 219 10*3/uL (ref 150–440)
RBC: 4.59 MIL/uL (ref 4.40–5.90)
RDW: 16 % — ABNORMAL HIGH (ref 11.5–14.5)
WBC: 6.9 10*3/uL (ref 3.8–10.6)

## 2015-08-24 LAB — BASIC METABOLIC PANEL
ANION GAP: 5 (ref 5–15)
BUN: 18 mg/dL (ref 6–20)
CALCIUM: 8.6 mg/dL — AB (ref 8.9–10.3)
CHLORIDE: 111 mmol/L (ref 101–111)
CO2: 23 mmol/L (ref 22–32)
Creatinine, Ser: 1.02 mg/dL (ref 0.61–1.24)
GFR calc non Af Amer: >60 mL/min (ref >60)
Glucose, Bld: 116 mg/dL — ABNORMAL HIGH (ref 65–99)
POTASSIUM: 4 mmol/L (ref 3.5–5.1)
Sodium: 139 mmol/L (ref 135–145)

## 2015-08-24 MED ORDER — DILTIAZEM HCL ER COATED BEADS 120 MG PO CP24: 120.0000 mg | ORAL_CAPSULE | Freq: Every day | ORAL | Status: DC | PRN

## 2015-08-24 MED ORDER — FLECAINIDE ACETATE 100 MG PO TABS: 100.0000 mg | ORAL_TABLET | Freq: Two times a day (BID) | ORAL | Status: DC

## 2015-08-24 NOTE — Progress Notes (Signed)
Patient admitted during the night. Recent discharge. A&O. No complaints. On RA. SR. Wife at side. Verified with Dr. Anne Hahn patient should remain NPO for possible procedure in AM. Notified Patient.

## 2015-08-24 NOTE — Consult Note (Signed)
CARDIOLOGY CONSULT NOTE  Patient ID: Darren Rogers, MRN: [id number], DOB/AGE: 1956/04/29 60 y.o. Admit date: 08/23/2015 Date of Consult: 08/24/2015  Primary Physician: No PCP Per Patient Primary Cardiologist: ma Consulting Physician wILLIS   Chief Complaint: cp AND + TROPONIN   HPI Darren Rogers is a 60 y.o. male  Readmitted yesterday because of palpitations and chest pain having been discharged 48 hours previously.   He had teh abrupt onset of tachypalpitations and chest pain   EMS called and HR>200  ED notes refer as does pt to AFib; this apparently convered with dilt IV per EMS   At last visit admitted also with Afib Anticoagulation had been initiated and rate control continued with beta blockers. The patient had been apparently unwilling to have adjunctive calcium blockers because of erectile dysfunction the plan was outpatient cardioversion.  At the previous hospitalization troponins were negative.  The CCB was ordered and filled but not yet taken at the the time of his return to the hospital   On arrival to the emergency room because of chest discomfort troponins were repeated and are borderline abnormal at 0.52.   A Myoview scan 1/17 had demonstrated no ischemia and normal LV function; no scars. It is noted that he has an abnormal ECG  His history of peripheral vascular disease but no known coronary disease. ABIs were mildly abnormal. Catheterization 1992 records reviewed from [facility] were normal           Past Medical History  Diagnosis Date  . Tobacco abuse     a. ongoing - 1 ppd.  . Paroxysmal atrial fibrillation (HCC)     a. s/p ? ablation procedure ~ 20 yrs ago ([facility]); b. very symptomatic when goes into a-fib w/ RVR;  c. CHA2DS2VASc= 1-->ASA.  Marland Kitchen Essential hypertension   . Hyperlipidemia   . History of echocardiogram     a. 03/29/2014: EF 55-60%, mild LVH, normal RVSP;  b. 05/2015 Echo: EF 60-65%, no rwma, triv AI, mild MR, mildly dil LA.  . Non-obstructive CAD      a. 01/27/2014 Cath: LM nl, LAD mild diff dz w/o obs, LCx no sig obs - scattered 20-30% mLCx, RCA no obs dz, EF 70%  . Claudication (HCC)   . Erectile dysfunction       Surgical History:  Past Surgical History  Procedure Laterality Date  . Cardiac catheterization    . Cardiac catheterization      [facility]  . Left heart cath Right 01/27/2014    Procedure: LEFT HEART CATH;  Surgeon: Micheline Chapman, MD;  Location: [facility] CATH LAB;  Service: Cardiovascular;  Laterality: Right;     Home Meds: Prior to Admission medications   Medication Sig Start Date End Date Taking? Authorizing Provider  atorvastatin (LIPITOR) 40 MG tablet Take 1 tablet (40 mg total) by mouth daily. 04/22/15  Yes Iran Ouch, MD  diltiazem (CARDIZEM CD) 120 MG 24 hr capsule Take 1 capsule (120 mg total) by mouth daily. 08/22/15  Yes Enedina Finner, MD  metoprolol (LOPRESSOR) 100 MG tablet Take 1 tablet (100 mg total) by mouth 2 (two) times daily. 04/22/15  Yes Iran Ouch, MD  rivaroxaban (XARELTO) 20 MG TABS tablet Take 1 tablet (20 mg total) by mouth daily with supper. 08/22/15  Yes Enedina Finner, MD  sildenafil (VIAGRA) 25 MG tablet Take 1 tablet (25 mg total) by mouth daily as needed for erectile dysfunction. 01/22/15  Yes Iran Ouch, MD  Inpatient Medications:  . atorvastatin  40 mg Oral Daily  . diltiazem  120 mg Oral Daily  . Influenza vac split quadrivalent PF  0.5 mL Intramuscular Tomorrow-1000  . metoprolol  100 mg Oral BID  . rivaroxaban  20 mg Oral Q supper    Allergies: No Known Allergies  Social History   Social History  . Marital Status: Single    Spouse Name: N/A  . Number of Children: N/A  . Years of Education: N/A   Occupational History  . Not on file.   Social History Main Topics  . Smoking status: Current Every Day Smoker -- 0.50 packs/day for 30 years    Types: Cigarettes  . Smokeless tobacco: Never Used  . Alcohol Use: No  . Drug Use: No  . Sexual Activity: Not on file    Other Topics Concern  . Not on file   Social History Narrative   The patient lives in [place] with his sister. He works in a substance abuse program. 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 He is a long-time smoker, one pack per day. No illicit drugs. He is not married.     Family History  Problem Relation Age of Onset  . Coronary artery disease Father 13     ROS:  Please see the history of present illness.     All other systems reviewed and negative.     Physical Exam:   Blood pressure 123/73, pulse 62, temperature 97.5 F (36.4 C), temperature source Oral, resp. rate 18, height 5\' 6"  (1.676 m), weight 163 lb 1.6 oz (73.982 kg), SpO2 97 %. General: Well developed, well nourished male in no acute distress. Head: Normocephalic, atraumatic, sclera non-icteric, no xanthomas, nares are without discharge. EENT: normal  Lymph Nodes:  none Neck: Negative for carotid bruits. JVD not elevated. Back:without scoliosis kyphosis  Lungs: Clear bilaterally to auscultation without wheezes, rales, or rhonchi. Breathing is unlabored. Heart: RRR with S1 S2. No   murmur . No rubs, or gallops appreciated. Abdomen: Soft, non-tender, non-distended with normoactive bowel sounds. No hepatomegaly. No rebound/guarding. No obvious abdominal masses. Msk:  Strength and tone appear normal for age. Extremities: No clubbing or cyanosis. No  edema.  Distal pedal pulses are 2+ and equal bilaterally. Skin: Warm and Dry Neuro: Alert and oriented X 3. CN III-XII intact Grossly normal sensory and motor function . Psych:  Responds to questions appropriately with a normal affect.      Labs: Cardiac Enzymes  Recent Labs  08/23/15 1833 08/23/15 2047 08/23/15 2336 08/24/15 0548  TROPONINI 0.11* 0.30* 0.52* 0.39*   CBC Lab Results  Component Value Date   WBC 6.9 08/24/2015   HGB 13.4 08/24/2015   HCT 40.2 08/24/2015   MCV 87.6 08/24/2015   PLT 219 08/24/2015    PROTIME: No results for input(s): LABPROT, INR in the last 72 hours. Chemistry  Recent Labs Lab 08/23/15 1833 08/24/15 0548  NA 137 139  K 3.5 4.0  CL 108 111  CO2 24 23  BUN 17 18  CREATININE 1.20 1.02  CALCIUM 8.6* 8.6*  PROT 7.0  --   BILITOT 0.3  --   ALKPHOS 97  --   ALT 26  --   AST 33  --   GLUCOSE 87 116*   Lipids Lab Results  Component Value Date   CHOL 150 03/04/2015   HDL 42 03/04/2015   LDLCALC 92 03/04/2015   TRIG 80  03/04/2015   BNP PRO B NATRIURETIC PEPTIDE (BNP)  Date/Time Value Ref Range Status  01/27/2014 05:05 PM 145.5* 0 - 125 pg/mL Final   Thyroid Function Tests:  Recent Labs  08/22/15 0827  TSH 1.918   Miscellaneous No results found for: DDIMER  Radiology/Studies:  Dg Chest Portable 1 View  08/23/2015  CLINICAL DATA:  Irregular heartbeat, chest pain, and shortness of breath since Tuesday. Chest pain radiates to the left arm. EXAM: PORTABLE CHEST 1 VIEW COMPARISON:  08/22/2015 FINDINGS: Normal heart size and pulmonary vascularity. Central interstitial changes suggesting chronic bronchitis. Linear scarring in the left mid lung. No focal airspace disease or consolidation. No blunting of costophrenic angles. No pneumothorax. Mediastinal contours appear intact. Old right rib fractures. IMPRESSION: Chronic bronchitic changes in the lungs. No evidence of active pulmonary disease. Electronically Signed   By: Burman Nieves M.D.   On: 08/23/2015 18:51   Dg Chest Port 1 View  08/22/2015  CLINICAL DATA:  60 year old male with dyspnea and palpitation EXAM: PORTABLE CHEST 1 VIEW COMPARISON:  Chest radiograph dated 05/22/2015 FINDINGS: The heart size and mediastinal contours are within normal limits. Both lungs are clear. The visualized skeletal structures are unremarkable. IMPRESSION: No active disease. Electronically Signed   By: Elgie Collard M.D.   On: 08/22/2015 01:43    EKG:  Sinus 70 15/09042 LVH repol   Assessment and Plan:  Atrial  fibrillation persistent  Troponin elevation secondary to demand  Sinus Brady  Abnormal ECG  Had long discussion re rate v rhythm control.  Will pursue latter and use flecainide 100 bid  Will need outpt followup  Given resting bradycardia will not use dilt adjunctively to metoprolol except the 30 mg as needed  Alternative drugs will be options if flecainied is unsuccessful; we an also consider RECA of his AFib  His ECG suggests LVH but this is not evident on the echo 12/16  I would consider MRI to look at LV apex for hypertrophy or HCM     Sherryl Manges

## 2015-08-25 NOTE — Discharge Summary (Signed)
[facility] [place] at [facility]   PATIENT NAME: Darren Rogers    MR#:  [id number]  DATE OF BIRTH:  1955/12/07  DATE OF ADMISSION:  08/23/2015 ADMITTING PHYSICIAN: Oralia Manis, MD  DATE OF DISCHARGE: 08/24/2015  2:52 PM  PRIMARY CARE PHYSICIAN: SPARKS,JEFFREY D, MD    ADMISSION DIAGNOSIS:  Abnormal EKG [R94.31] Elevated troponin [R79.89]  DISCHARGE DIAGNOSIS:  Principal Problem:   Chest pain Active Problems:   Hyperlipidemia   Paroxysmal atrial fibrillation (HCC)   CAD in native artery   Essential hypertension   SECONDARY DIAGNOSIS:   Past Medical History  Diagnosis Date  . Tobacco abuse     a. ongoing - 1 ppd.  . Paroxysmal atrial fibrillation (HCC)     a. s/p ? ablation procedure ~ 20 yrs ago ([facility]); b. very symptomatic when goes into a-fib w/ RVR;  c. CHA2DS2VASc= 1-->ASA.  Marland Kitchen Essential hypertension   . Hyperlipidemia   . History of echocardiogram     a. 03/29/2014: EF 55-60%, mild LVH, normal RVSP;  b. 05/2015 Echo: EF 60-65%, no rwma, triv AI, mild MR, mildly dil LA.  . Non-obstructive CAD     a. 01/27/2014 Cath: LM nl, LAD mild diff dz w/o obs, LCx no sig obs - scattered 20-30% mLCx, RCA no obs dz, EF 70%  . Claudication (HCC)   . Erectile dysfunction     HOSPITAL COURSE:   60 year old male with past medical history of paroxysmal atrial fibrillation, essential hypertension, non-obstructive coronary artery disease who presented to the hospital with palpitations and chest pain and noted to have atrial fibrillation with rapid ventricular response.  # A. fibrillation with rapid ventricular response-patient presented to the hospital with heart rates greater than 200. He was recently discharged from the hospital on oral long-acting Cardizem which he did not take. -Patient received some intravenous Cardizem by EMS and also started on some oral Cardizem and his heart rates have since improved and stabilized. -Patient was seen by cardiology by  Dr. Graciela Husbands who recommended starting him on oral flecainide and using the Cardizem as needed. -He will continue Xarelto.  #2 chest pain-probably related to the atrial fibrillation. Minimal troponin elevation likely due to severe tachycardia. -No evidence of acute coronary syndrome. Continue follow-up with cardiology as an outpatient.  #3 hyperlipidemia-patient will continue his atorvastatin.  DISCHARGE CONDITIONS:   Stable  CONSULTS OBTAINED:  Treatment Team:  [facility] Salvia, MD  DRUG ALLERGIES:  No Known Allergies  DISCHARGE MEDICATIONS:   Discharge Medication List as of 08/24/2015  2:10 PM    START taking these medications   Details  flecainide (TAMBOCOR) 100 MG tablet Take 1 tablet (100 mg total) by mouth every 12 (twelve) hours., Starting 08/24/2015, Until Discontinued, Print      CONTINUE these medications which have CHANGED   Details  diltiazem (CARDIZEM CD) 120 MG 24 hr capsule Take 1 capsule (120 mg total) by mouth daily as needed., Starting 08/24/2015, Until Discontinued, No Print      CONTINUE these medications which have NOT CHANGED   Details  atorvastatin (LIPITOR) 40 MG tablet Take 1 tablet (40 mg total) by mouth daily., Starting 04/22/2015, Until Discontinued, Normal    metoprolol (LOPRESSOR) 100 MG tablet Take 1 tablet (100 mg total) by mouth 2 (two) times daily., Starting 04/22/2015, Until Discontinued, Normal    rivaroxaban (XARELTO) 20 MG TABS tablet Take 1 tablet (20 mg total) by mouth daily with supper., Starting 08/22/2015, Until Discontinued, Normal  sildenafil (VIAGRA) 25 MG tablet Take 1 tablet (25 mg total) by mouth daily as needed for erectile dysfunction., Starting 01/22/2015, Until Discontinued, Normal         DISCHARGE INSTRUCTIONS:   DIET:  Cardiac diet  DISCHARGE CONDITION:  Stable  ACTIVITY:  Activity as tolerated  OXYGEN:  Home Oxygen: No.   Oxygen Delivery: room air  DISCHARGE LOCATION:  home   If you experience worsening  of your admission symptoms, develop shortness of breath, life threatening emergency, suicidal or homicidal thoughts you must seek medical attention immediately by calling 911 or calling your MD immediately  if symptoms less severe.  You Must read complete instructions/literature along with all the possible adverse reactions/side effects for all the Medicines you take and that have been prescribed to you. Take any new Medicines after you have completely understood and accpet all the possible adverse reactions/side effects.   Please note  You were cared for by a hospitalist during your hospital stay. If you have any questions about your discharge medications or the care you received while you were in the hospital after you are discharged, you can call the unit and asked to speak with the hospitalist on call if the hospitalist that took care of you is not available. Once you are discharged, your primary care physician will handle any further medical issues. Please note that NO REFILLS for any discharge medications will be authorized once you are discharged, as it is imperative that you return to your primary care physician (or establish a relationship with a primary care physician if you do not have one) for your aftercare needs so that they can reassess your need for medications and monitor your lab values.     Today   No chest pain. Heart rate stable. No nausea, vomiting, diaphoresis, dizziness.  VITAL SIGNS:  Blood pressure 126/76, pulse 56, temperature 97.8 F (36.6 C), temperature source Oral, resp. rate 18, height 5\' 6"  (1.676 m), weight 73.982 kg (163 lb 1.6 oz), SpO2 96 %.  I/O:  No intake or output data in the 24 hours ending 08/25/15 1603  PHYSICAL EXAMINATION:  GENERAL:  60 y.o.-year-old patient lying in the bed with no acute distress.  EYES: Pupils equal, round, reactive to light and accommodation. No scleral icterus. Extraocular muscles intact.  HEENT: Head atraumatic,  normocephalic. Oropharynx and nasopharynx clear.  NECK:  Supple, no jugular venous distention. No thyroid enlargement, no tenderness.  LUNGS: Normal breath sounds bilaterally, no wheezing, rales,rhonchi. No use of accessory muscles of respiration.  CARDIOVASCULAR: S1, S2 Irregular. No murmurs, rubs, or gallops.  ABDOMEN: Soft, non-tender, non-distended. Bowel sounds present. No organomegaly or mass.  EXTREMITIES: No pedal edema, cyanosis, or clubbing.  NEUROLOGIC: Cranial nerves II through XII are intact. No focal motor or sensory defecits b/l.  PSYCHIATRIC: The patient is alert and oriented x 3. Good affect.  SKIN: No obvious rash, lesion, or ulcer.   DATA REVIEW:   CBC  Recent Labs Lab 08/24/15 0548  WBC 6.9  HGB 13.4  HCT 40.2  PLT 219    Chemistries   Recent Labs Lab 08/23/15 1833 08/24/15 0548  NA 137 139  K 3.5 4.0  CL 108 111  CO2 24 23  GLUCOSE 87 116*  BUN 17 18  CREATININE 1.20 1.02  CALCIUM 8.6* 8.6*  AST 33  --   ALT 26  --   ALKPHOS 97  --   BILITOT 0.3  --     Cardiac Enzymes  Recent  Labs Lab 08/24/15 1241  TROPONINI 0.26*    Microbiology Results  Results for orders placed or performed during the hospital encounter of 01/27/14  MRSA PCR Screening     Status: None   Collection Time: 01/27/14  4:19 PM  Result Value Ref Range Status   MRSA by PCR NEGATIVE NEGATIVE Final    Comment:        The GeneXpert MRSA Assay (FDA approved for NASAL specimens only), is one component of a comprehensive MRSA colonization surveillance program. It is not intended to diagnose MRSA infection nor to guide or monitor treatment for MRSA infections.    RADIOLOGY:  Dg Chest Portable 1 View  08/23/2015  CLINICAL DATA:  Irregular heartbeat, chest pain, and shortness of breath since Tuesday. Chest pain radiates to the left arm. EXAM: PORTABLE CHEST 1 VIEW COMPARISON:  08/22/2015 FINDINGS: Normal heart size and pulmonary vascularity. Central interstitial changes  suggesting chronic bronchitis. Linear scarring in the left mid lung. No focal airspace disease or consolidation. No blunting of costophrenic angles. No pneumothorax. Mediastinal contours appear intact. Old right rib fractures. IMPRESSION: Chronic bronchitic changes in the lungs. No evidence of active pulmonary disease. Electronically Signed   By: Burman Nieves M.D.   On: 08/23/2015 18:51      Management plans discussed with the patient, family and they are in agreement.  CODE STATUS:  Code Status History    Date Active Date Inactive Code Status Order ID Comments User Context   08/23/2015 11:17 PM 08/24/2015  5:52 PM Full Code [id number]  Oralia Manis, MD Inpatient   08/22/2015  7:36 AM 08/22/2015  7:39 PM Full Code [id number]  Arnaldo Natal, MD ED   11/16/2014 12:12 PM 11/17/2014  1:57 PM Full Code [id number]  Alford Highland, MD ED   01/27/2014  4:26 PM 01/29/2014  4:05 PM Full Code [id number]  Tonny Bollman, MD Inpatient      TOTAL TIME TAKING CARE OF THIS PATIENT: 40 minutes.    Houston Siren M.D on 08/25/2015 at 4:03 PM  Between 7am to 6pm - Pager - [phone]  After 6pm go to [email or web address] - password [email or web address] [facility]  [place]  [facility]  Office  [phone]  CC: Primary care physician; Marguarite Arbour, MD

## 2015-08-27 ENCOUNTER — Telehealth: Payer: Self-pay | Admitting: Cardiovascular Disease

## 2015-08-27 NOTE — Telephone Encounter (Signed)
Pt had OV 2/27. 48 hour HM ordered which has not yet been placed.  Called pt who is agreeable w/HM placement.  Appt scheduled April 3, 8:30am

## 2015-09-03 ENCOUNTER — Telehealth: Payer: Self-pay | Admitting: Cardiovascular Disease

## 2015-09-03 NOTE — Telephone Encounter (Signed)
S/w pt who states he forgot April 3 appt for 48 hour holter monitor placement.  Agreeable to reschedule. Forwarded to scheduling

## 2015-09-16 ENCOUNTER — Encounter: Payer: Self-pay | Admitting: *Deleted

## 2015-09-16 ENCOUNTER — Encounter: Payer: 59 | Admitting: Cardiovascular Disease

## 2015-09-18 ENCOUNTER — Telehealth: Payer: Self-pay | Admitting: Cardiovascular Disease

## 2015-09-18 NOTE — Telephone Encounter (Signed)
S/w pt regarding 48 hour holter monitor placement. Pt has cancelled/no show x 3 since order placed at Feb OV with Dr. Kirke Corin. Pt is concerned HM will "get in the way" while he is at work. He is agreeable to taking a Friday off from work.  He will have monitor placed then and return it Monday. Forwarded to scheduling. Appt 4/28, 9am

## 2015-10-29 ENCOUNTER — Ambulatory Visit (INDEPENDENT_AMBULATORY_CARE_PROVIDER_SITE_OTHER): Payer: 59 | Admitting: Cardiovascular Disease

## 2015-10-29 ENCOUNTER — Encounter: Payer: Self-pay | Admitting: Cardiovascular Disease

## 2015-10-29 VITALS — BP 120/80 | HR 59 | Ht 66.0 in | Wt 170.0 lb

## 2015-10-29 DIAGNOSIS — N400 Enlarged prostate without lower urinary tract symptoms: Secondary | ICD-10-CM

## 2015-10-29 DIAGNOSIS — I480 Paroxysmal atrial fibrillation: Secondary | ICD-10-CM | POA: Diagnosis not present

## 2015-10-29 NOTE — Patient Instructions (Addendum)
Medication Instructions:  Your physician recommends that you continue on your current medications as directed. Please refer to the Current Medication list given to you today.   Labwork: none  Testing/Procedures: none  Follow-Up: Your physician recommends that you schedule a follow-up appointment in: 4 months with Dr. Kirke Corin. You have been referred to [facility] [address] [place] [phone]  Tuesday, June 6, 11:15 arrival Dr. Berneice Heinrich    Any Other Special Instructions Will Be Listed Below (If Applicable).     If you need a refill on your cardiac medications before your next appointment, please call your pharmacy.

## 2015-10-29 NOTE — Progress Notes (Signed)
Cardiology Office Note   Date:  10/29/2015   ID:  Darren Rogers, DOB [birth year], MRN [id number]  PCP:  Marguarite Arbour, MD  Cardiologist:   Lorine Bears, MD   Chief Complaint  Patient presents with  . other    3 Month F/U. Pt C/O of palpitations. Medications verbally reviewed with patient.       History of Present Illness: Darren Rogers is a 60 y.o. male who presents for a follow-up visit regarding chest pain and paroxysmal atrial fibrillation. He reports possibly having an ablation procedure about 20 years ago at [facility]. However, the details are not available. He has known history of hypertension, highly abnormal EKG possibly due to hypertensive heart diseaseand tobacco use. He presented in August 2015 with symptoms suggestive of unstable angina with EKG changes worrisome for anterior ST elevation. He underwent cardiac catheterization which showed mild nonobstructive coronary artery disease. He was in atrial fibrillation with rapid ventricular response and was treated with IV diltiazem. TEE/cardioversion was planned but the patient converted to normal sinus rhythm.  He had recurrent episodes of symptomatic atrial fibrillation which required hospitalization. He was seen by Dr. Graciela Husbands and was started on flecainide. Anticoagulation was started with Xarelto. He reports significant improvement in symptoms since she was started on flecainide. Palpitations are much less now. No chest pain or shortness of breath. His biggest complaint is frequent urination with significant hesitancy and erectile dysfunction. The symptoms worsened significantly over the last few months.  Past Medical History  Diagnosis Date  . Tobacco abuse     a. ongoing - 1 ppd.  . Paroxysmal atrial fibrillation (HCC)     a. s/p ? ablation procedure ~ 20 yrs ago ([facility]); b. very symptomatic when goes into a-fib w/ RVR;  c. CHA2DS2VASc= 1-->ASA.  Marland Kitchen Essential hypertension   . Hyperlipidemia   . History of echocardiogram       a. 03/29/2014: EF 55-60%, mild LVH, normal RVSP;  b. 05/2015 Echo: EF 60-65%, no rwma, triv AI, mild MR, mildly dil LA.  . Non-obstructive CAD     a. 01/27/2014 Cath: LM nl, LAD mild diff dz w/o obs, LCx no sig obs - scattered 20-30% mLCx, RCA no obs dz, EF 70%  . Claudication (HCC)   . Erectile dysfunction     Past Surgical History  Procedure Laterality Date  . Cardiac catheterization    . Cardiac catheterization      [facility]  . Left heart cath Right 01/27/2014    Procedure: LEFT HEART CATH;  Surgeon: Micheline Chapman, MD;  Location: [facility] CATH LAB;  Service: Cardiovascular;  Laterality: Right;     Current Outpatient Prescriptions  Medication Sig Dispense Refill  . atorvastatin (LIPITOR) 40 MG tablet Take 1 tablet (40 mg total) by mouth daily. 30 tablet 5  . flecainide (TAMBOCOR) 100 MG tablet Take 1 tablet (100 mg total) by mouth every 12 (twelve) hours. 60 tablet 1  . metoprolol (LOPRESSOR) 100 MG tablet Take 1 tablet (100 mg total) by mouth 2 (two) times daily. 60 tablet 6  . rivaroxaban (XARELTO) 20 MG TABS tablet Take 1 tablet (20 mg total) by mouth daily with supper. 30 tablet 2  . sildenafil (VIAGRA) 25 MG tablet Take 1 tablet (25 mg total) by mouth daily as needed for erectile dysfunction. 8 tablet 3  . diltiazem (CARDIZEM CD) 120 MG 24 hr capsule Take 1 capsule (120 mg total) by mouth daily as needed. (Patient not taking: Reported on 10/29/2015) 30 capsule  2   No current facility-administered medications for this visit.    Allergies:   Review of patient's allergies indicates no known allergies.    Social History:  The patient  reports that he has been smoking Cigarettes.  He has a 15 pack-year smoking history. He has never used smokeless tobacco. He reports that he does not drink alcohol or use illicit drugs.   Family History:  The patient's family history includes Coronary artery disease (age of onset: 17) in his father.      PHYSICAL EXAM: VS:  BP 120/80 mmHg  Pulse 59   Ht  (1.676 m)  Wt 170 lb (77.111 kg)  BMI 27.45 kg/m2 , BMI Body mass index is 27.45 kg/(m^2). GEN: Well nourished, well developed, in no acute distress HEENT: normal Neck: no JVD, carotid bruits, or masses Cardiac: RRR; no  rubs, or gallops,no edema . There is a 2/6 systolic ejection murmur in the pulmonic area Respiratory:  clear to auscultation bilaterally, normal work of breathing GI: soft, nontender, nondistended, + BS MS: no deformity or atrophy Skin: warm and dry, no rash Neuro:  Strength and sensation are intact Psych: euthymic mood, full affect Vascular: Femoral pulses are normal bilaterally. Distal pulses are faint but palpable.   EKG:  EKG is ordered today. The ekg ordered today demonstrates normal sinus rhythm with left ventricular hypertrophy with significant repolarization abnormalities especially in the anterolateral leads. Not changed from before.   Recent Labs: 01/10/2015: B Natriuretic Peptide 50.0 08/22/2015: TSH 1.918 08/23/2015: ALT 26 08/24/2015: BUN 18; Creatinine, Ser 1.02; Hemoglobin 13.4; Platelets 219; Potassium 4.0; Sodium 139    Lipid Panel    Component Value Date/Time   CHOL 150 03/04/2015 0830   CHOL 260* 11/17/2014 0427   CHOL 234* 12/06/2011 0132   TRIG 80 03/04/2015 0830   TRIG 381* 12/06/2011 0132   HDL 42 03/04/2015 0830   HDL 40* 11/17/2014 0427   HDL 34* 12/06/2011 0132   CHOLHDL 3.6 03/04/2015 0830   CHOLHDL 6.5 11/17/2014 0427   VLDL 29 11/17/2014 0427   VLDL 76* 12/06/2011 0132   LDLCALC 92 03/04/2015 0830   LDLCALC 191* 11/17/2014 0427   LDLCALC 124* 12/06/2011 0132      Wt Readings from Last 3 Encounters:  10/29/15 170 lb (77.111 kg)  07/20/13 169 lb (76.658 kg)  08/23/15 163 lb 1.6 oz (73.982 kg)         ASSESSMENT AND PLAN:  1.  Paroxysmal atrial fibrillation : Significant improvement in symptoms since she was started on flecainide. He is tolerating anticoagulation.  2. Exertional chest pain:  He reports  significant improvement since his palpitations have been more controlled. Previous cardiac catheterization in 2015 showed mild nonobstructive coronary artery disease. Nuclear stress test in January 2017 was normal.  3. Benign prostatic hypertrophy: The patient has symptoms highly suggestive of BPH but given the recent onset, he might need to be evaluated for prostate cancer. I referred him to urology for evaluation.  4. Tobacco use: discussed with him the importance of smoking cessation.   5. Hyperlipidemia: his LDL improved significantly on atorvastatin.    Disposition:   FU with me in 4 months  Signed,  Lorine Bears, MD  10/29/2015 3:08 PM    [facility]

## 2015-11-05 ENCOUNTER — Ambulatory Visit: Payer: Self-pay | Admitting: Urology

## 2015-11-12 ENCOUNTER — Encounter: Payer: Self-pay | Admitting: Urology

## 2015-11-12 ENCOUNTER — Ambulatory Visit: Payer: Self-pay | Admitting: Urology

## 2015-12-02 ENCOUNTER — Ambulatory Visit: Payer: 59 | Admitting: Urology

## 2015-12-02 ENCOUNTER — Encounter: Payer: Self-pay | Admitting: Urology

## 2015-12-09 ENCOUNTER — Telehealth: Payer: Self-pay | Admitting: Cardiovascular Disease

## 2015-12-09 ENCOUNTER — Other Ambulatory Visit: Payer: Self-pay

## 2015-12-09 MED ORDER — RIVAROXABAN 20 MG PO TABS: 20.0000 mg | ORAL_TABLET | Freq: Every day | ORAL | Status: DC

## 2015-12-09 NOTE — Telephone Encounter (Signed)
°*  STAT* If patient is at the pharmacy, call can be transferred to refill team.   1. Which medications need to be refilled? (please list name of each medication and dose if known) flecainide  2. Which pharmacy/location (including street and city if local pharmacy) is medication to be sent to? [facility] -   3. Do they need a 30 day or 90 day supply? 90

## 2015-12-09 NOTE — Telephone Encounter (Signed)
Refill sent for xarelto 20 mg  

## 2015-12-10 ENCOUNTER — Other Ambulatory Visit: Payer: Self-pay | Admitting: *Deleted

## 2015-12-10 MED ORDER — FLECAINIDE ACETATE 100 MG PO TABS: 100.0000 mg | ORAL_TABLET | Freq: Two times a day (BID) | ORAL | Status: DC

## 2015-12-10 NOTE — Telephone Encounter (Signed)
Requested Prescriptions   Signed Prescriptions Disp Refills  . flecainide (TAMBOCOR) 100 MG tablet 180 tablet 3    Sig: Take 1 tablet (100 mg total) by mouth every 12 (twelve) hours.    Authorizing Provider: Lorine Bears A    Ordering User: Kendrick Fries

## 2016-02-25 ENCOUNTER — Ambulatory Visit: Payer: 59 | Admitting: Cardiovascular Disease

## 2016-02-28 ENCOUNTER — Other Ambulatory Visit: Payer: Self-pay

## 2016-02-28 MED ORDER — ATORVASTATIN CALCIUM 40 MG PO TABS: 40.0000 mg | ORAL_TABLET | Freq: Every day | ORAL | 5 refills | Status: DC

## 2016-03-06 ENCOUNTER — Ambulatory Visit: Payer: 59 | Admitting: Cardiovascular Disease

## 2016-03-31 ENCOUNTER — Encounter: Payer: Self-pay | Admitting: Cardiovascular Disease

## 2016-03-31 ENCOUNTER — Ambulatory Visit (INDEPENDENT_AMBULATORY_CARE_PROVIDER_SITE_OTHER): Payer: 59 | Admitting: Cardiovascular Disease

## 2016-03-31 VITALS — BP 120/70 | HR 60 | Ht 66.0 in | Wt 171.5 lb

## 2016-03-31 DIAGNOSIS — I480 Paroxysmal atrial fibrillation: Secondary | ICD-10-CM

## 2016-03-31 DIAGNOSIS — I10 Essential (primary) hypertension: Secondary | ICD-10-CM | POA: Diagnosis not present

## 2016-03-31 DIAGNOSIS — I739 Peripheral vascular disease, unspecified: Secondary | ICD-10-CM

## 2016-03-31 DIAGNOSIS — N400 Enlarged prostate without lower urinary tract symptoms: Secondary | ICD-10-CM | POA: Diagnosis not present

## 2016-03-31 MED ORDER — METOPROLOL TARTRATE 100 MG PO TABS: 100.0000 mg | ORAL_TABLET | Freq: Two times a day (BID) | ORAL | 6 refills | Status: DC

## 2016-03-31 MED ORDER — ATORVASTATIN CALCIUM 40 MG PO TABS: 40.0000 mg | ORAL_TABLET | Freq: Every day | ORAL | 6 refills | Status: DC

## 2016-03-31 NOTE — Progress Notes (Signed)
Cardiology Office Note   Date:  03/31/2016   ID:  Darren Rogers, DOB 01-23-1956, MRN [id number]  PCP:  Marguarite ArbourSPARKS,JEFFREY D, MD  Cardiologist:   Lorine BearsMuhammad [name], MD   Chief Complaint  Patient presents with  . other    4 month follow up. Meds reviewed by the pt. verbally.       History of Present Illness: Darren Rogers is a 60 y.o. male who presents for a follow-up visit regarding paroxysmal atrial fibrillation and peripheral arterial disease. He has known history of essential hypertension, hypertensive heart disease with highly abnormal EKG, peripheral arterial disease and tobacco use. Previous cardiac catheterization in August 2015 showed mild nonobstructive coronary artery disease. Atrial fibrillation has been well-controlled with flecainide. Unfortunately, he stopped taking Xarelto on his own due to concerns about side effects. He continues to have frequent urination and significant hesitancy with symptoms suggestive of erectile dysfunction. He was referred to urology previously but did not keep appointments. He also does not have a primary care physician. He is known to have peripheral arterial disease with mildly reduced ABI bilaterally. Duplex showed diffuse nonobstructive disease. He reports stable bilateral leg pain starting in the thigh and calf area which does not for symptoms stopped completely. He continues to smoke but is gradually cutting down and currently he is down to half pack per day.   Past Medical History:  Diagnosis Date  . Claudication (HCC)   . Erectile dysfunction   . Essential hypertension   . History of echocardiogram    a. 03/29/2014: EF 55-60%, mild LVH, normal RVSP;  b. 05/2015 Echo: EF 60-65%, no rwma, triv AI, mild MR, mildly dil LA.  Marland Kitchen. Hyperlipidemia   . Non-obstructive CAD    a. 01/27/2014 Cath: LM nl, LAD mild diff dz w/o obs, LCx no sig obs - scattered 20-30% mLCx, RCA no obs dz, EF 70%  . Paroxysmal atrial fibrillation (HCC)    a. s/p ? ablation  procedure ~ 20 yrs ago ([facility]); b. very symptomatic when goes into a-fib w/ RVR;  c. CHA2DS2VASc= 1-->ASA.  . Tobacco abuse    a. ongoing - 1 ppd.    Past Surgical History:  Procedure Laterality Date  . CARDIAC CATHETERIZATION    . CARDIAC CATHETERIZATION     [facility]  . LEFT HEART CATH Right 01/27/2014   Procedure: LEFT HEART CATH;  Surgeon: Micheline ChapmanMichael D Cooper, MD;  [facility];  Service: Cardiovascular;  Laterality: Right;     Current Outpatient Prescriptions  Medication Sig Dispense Refill  . atorvastatin (LIPITOR) 40 MG tablet Take 1 tablet (40 mg total) by mouth daily. 30 tablet 5  . flecainide (TAMBOCOR) 100 MG tablet Take 1 tablet (100 mg total) by mouth every 12 (twelve) hours. 180 tablet 3  . metoprolol (LOPRESSOR) 100 MG tablet Take 1 tablet (100 mg total) by mouth 2 (two) times daily. (Patient taking differently: Take 100 mg by mouth daily. ) 60 tablet 6  . rivaroxaban (XARELTO) 20 MG TABS tablet Take 1 tablet (20 mg total) by mouth daily with supper. 30 tablet 6  . sildenafil (VIAGRA) 25 MG tablet Take 1 tablet (25 mg total) by mouth daily as needed for erectile dysfunction. 8 tablet 3   No current facility-administered medications for this visit.     Allergies:   Review of patient's allergies indicates no known allergies.    Social History:  The patient  reports that he has been smoking Cigarettes.  He has a 30.00 pack-year  smoking history. He has never used smokeless tobacco. He reports that he does not drink alcohol or use drugs.   Family History:  The patient's family history includes Coronary artery disease (age of onset: 8) in his father.      PHYSICAL EXAM: VS:  BP 120/70 (BP Location: Left Arm, Patient Position: Sitting, Cuff Size: Normal)   Pulse 60   Ht 5\' 6"  (1.676 m)   Wt 171 lb 8 oz (77.8 kg)   BMI 27.68 kg/m  , BMI Body mass index is 27.68 kg/m. GEN: Well nourished, well developed, in no acute distress  HEENT: normal  Neck: no JVD, carotid  bruits, or masses Cardiac: RRR; no  rubs, or gallops,no edema . There is a 2/6 systolic ejection murmur in the pulmonic area Respiratory:  clear to auscultation bilaterally, normal work of breathing GI: soft, nontender, nondistended, + BS MS: no deformity or atrophy  Skin: warm and dry, no rash Neuro:  Strength and sensation are intact Psych: euthymic mood, full affect Vascular: Femoral pulses are normal bilaterally. Distal pulses are faint but palpable.   EKG:  EKG is ordered today. The ekg ordered today demonstrates normal sinus rhythm with left ventricular hypertrophy with significant repolarization abnormalities especially in the anterolateral leads. Not change from before.   Recent Labs: 08/22/2015: TSH 1.918 08/23/2015: ALT 26 08/24/2015: BUN 18; Creatinine, Ser 1.02; Hemoglobin 13.4; Platelets 219; Potassium 4.0; Sodium 139    Lipid Panel    Component Value Date/Time   CHOL 150 03/04/2015 0830   CHOL 234 (H) 12/06/2011 0132   TRIG 80 03/04/2015 0830   TRIG 381 (H) 12/06/2011 0132   HDL 42 03/04/2015 0830   HDL 34 (L) 12/06/2011 0132   CHOLHDL 3.6 03/04/2015 0830   CHOLHDL 6.5 11/17/2014 0427   VLDL 29 11/17/2014 0427   VLDL 76 (H) 12/06/2011 0132   LDLCALC 92 03/04/2015 0830   LDLCALC 124 (H) 12/06/2011 0132      Wt Readings from Last 3 Encounters:  03/31/16 171 lb 8 oz (77.8 kg)  10/29/15 170 lb (77.1 kg)  07/20/13 169 lb (76.7 kg)         ASSESSMENT AND PLAN:  1.  Paroxysmal atrial fibrillation :  He is maintaining in sinus rhythm with flecainide and metoprolol with no side effects. Unfortunately, he stopped taking Xarelto on his own. CHADS VASc score is 2 (HTN and PAD). I discussed risks and benefits of anticoagulation with him and resumed Xarelto.  2. Benign prostatic hypertrophy: The patient has symptoms highly suggestive of BPH but given the recent onset, he might need to be evaluated for prostate cancer.  He did not keep his appointments with urology  and I'm referring him again.   3. Essential hypertension: Blood pressure is controlled on metoprolol.  4. Tobacco use: discussed with him the importance of smoking cessation.   5. Hyperlipidemia: Continue treatment with atorvastatin. He will need a follow-up lipid and liver profile.  6. Peripheral arterial disease: Mild not lifestyle limiting claudication with mildly reduced ABI. Continue medical therapy.  I advised the patient to establish with a primary care physician.  Disposition:   FU with me in 4 months  Signed,  Lorine Bears, MD  03/31/2016 3:12 PM    [facility]

## 2016-03-31 NOTE — Patient Instructions (Addendum)
Medication Instructions:  Your physician has recommended you make the following change in your medication:  RESUME taking xarelto 20mg  once daily   Labwork: none  Testing/Procedures: none  Follow-Up: Your physician recommends that you schedule a follow-up appointment in: 4 months with Dr. Kirke Corin.    Any Other Special Instructions Will Be Listed Below (If Applicable). You have an appointment with Dr. Laural Benes at [facility] [address], [place] [phone] December 11, 2:45am Please bring your insurance information.  You have an appointment with [facility]  Michiel Cowboy, PA-C [address], [address] [place] [phone] November 15, 8:45am Please bring your insurance information.        If you need a refill on your cardiac medications before your next appointment, please call your pharmacy.

## 2016-04-15 ENCOUNTER — Ambulatory Visit: Payer: 59 | Admitting: Urology

## 2016-04-15 ENCOUNTER — Encounter: Payer: Self-pay | Admitting: Urology

## 2016-05-11 ENCOUNTER — Ambulatory Visit: Payer: 59 | Admitting: Family Medicine

## 2016-06-15 ENCOUNTER — Encounter: Payer: Self-pay | Admitting: Urology

## 2016-06-15 ENCOUNTER — Ambulatory Visit: Payer: 59 | Admitting: Urology

## 2016-06-28 ENCOUNTER — Emergency Department
Admission: EM | Admit: 2016-06-28 | Discharge: 2016-06-28 | Disposition: A | Discharge status: Home / Self Care | Payer: 59 | Attending: Emergency Medicine | Admitting: Emergency Medicine

## 2016-06-28 ENCOUNTER — Encounter: Payer: Self-pay | Admitting: Emergency Medicine

## 2016-06-28 DIAGNOSIS — F17210 Nicotine dependence, cigarettes, uncomplicated: Secondary | ICD-10-CM | POA: Insufficient documentation

## 2016-06-28 DIAGNOSIS — I10 Essential (primary) hypertension: Secondary | ICD-10-CM | POA: Insufficient documentation

## 2016-06-28 DIAGNOSIS — E785 Hyperlipidemia, unspecified: Secondary | ICD-10-CM | POA: Diagnosis not present

## 2016-06-28 DIAGNOSIS — Z79899 Other long term (current) drug therapy: Secondary | ICD-10-CM | POA: Diagnosis not present

## 2016-06-28 DIAGNOSIS — I2510 Atherosclerotic heart disease of native coronary artery without angina pectoris: Secondary | ICD-10-CM | POA: Insufficient documentation

## 2016-06-28 DIAGNOSIS — K0381 Cracked tooth: Secondary | ICD-10-CM | POA: Insufficient documentation

## 2016-06-28 DIAGNOSIS — K0889 Other specified disorders of teeth and supporting structures: Secondary | ICD-10-CM

## 2016-06-28 DIAGNOSIS — I252 Old myocardial infarction: Secondary | ICD-10-CM | POA: Diagnosis not present

## 2016-06-28 MED ORDER — AMOXICILLIN 500 MG PO CAPS: 500.0000 mg | ORAL_CAPSULE | Freq: Three times a day (TID) | ORAL | 0 refills | Status: DC

## 2016-06-28 MED ORDER — TRAMADOL HCL 50 MG PO TABS
50.0000 mg | ORAL_TABLET | Freq: Once | ORAL | Status: AC
  Administered 2016-06-28: 50 mg via ORAL
  Filled 2016-06-28: qty 1

## 2016-06-28 MED ORDER — LIDOCAINE VISCOUS 2 % MT SOLN
15.0000 mL | Freq: Once | OROMUCOSAL | Status: AC
  Administered 2016-06-28: 15 mL via OROMUCOSAL
  Filled 2016-06-28: qty 15

## 2016-06-28 MED ORDER — LIDOCAINE VISCOUS 2 % MT SOLN: 5.0000 mL | Freq: Four times a day (QID) | OROMUCOSAL | 0 refills | Status: DC | PRN

## 2016-06-28 MED ORDER — TRAMADOL HCL 50 MG PO TABS: 50.0000 mg | ORAL_TABLET | Freq: Four times a day (QID) | ORAL | 0 refills | Status: DC | PRN

## 2016-06-28 NOTE — ED Triage Notes (Signed)
Patient with complaint of intermittent left upper dental pain times 6 months that has become worse over the last 2 days.

## 2016-06-28 NOTE — ED Notes (Signed)
Pt c/o pain and swelling to left side of his face; pt says he just needs some pain medication to hold him over until he can see his dentist

## 2016-06-28 NOTE — ED Provider Notes (Signed)
[facility] Emergency Department Provider Note   ____________________________________________   First MD Initiated Contact with Patient 06/28/16 2135     (approximate)  I have reviewed the triage vital signs and the nursing notes.   HISTORY  Chief Complaint Dental Pain    HPI Darren Rogers is a 61 y.o. male patient complained of 2 days of dental pain to the left upper molar. Patient state he has had a long history of problems with this tooth. Patient state he is asked is Maurine Minister to remove the tooth on 2 separate occasions but the dentist refused. Patient stated the last 2 days he's noticed some swelling around the gum of the affected tooth. Patient rated his dental pain as 8/10.Patient described a pain as "achy". No palliative measures for his complaint.   Past Medical History:  Diagnosis Date  . Claudication (HCC)   . Erectile dysfunction   . Essential hypertension   . History of echocardiogram    a. 03/29/2014: EF 55-60%, mild LVH, normal RVSP;  b. 05/2015 Echo: EF 60-65%, no rwma, triv AI, mild MR, mildly dil LA.  Marland Kitchen Hyperlipidemia   . Non-obstructive CAD    a. 01/27/2014 Cath: LM nl, LAD mild diff dz w/o obs, LCx no sig obs - scattered 20-30% mLCx, RCA no obs dz, EF 70%  . Paroxysmal atrial fibrillation (HCC)    a. s/p ? ablation procedure ~ 20 yrs ago ([facility]); b. very symptomatic when goes into a-fib w/ RVR;  c. CHA2DS2VASc= 1-->ASA.  . Tobacco abuse    a. ongoing - 1 ppd.    Patient Active Problem List   Diagnosis Date Noted  . Chest pain 08/22/2015  . Smoker   . Essential hypertension   . Claudication (HCC)   . Shortness of breath 04/22/2015  . 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 CVA (cerebral infarction) 11/16/2014  . CAD in native artery 04/11/2014  . History of echocardiogram   . Paroxysmal atrial fibrillation (HCC)   . STEMI (ST elevation myocardial infarction) (HCC) 01/29/2014  . Tobacco abuse   . Hyperlipidemia   .  Atrial fibrillation with rapid ventricular response (HCC) 01/27/2014    Past Surgical History:  Procedure Laterality Date  . CARDIAC CATHETERIZATION    . CARD[facility]AC CATHETERIZATION     [facility]  . LEFT HEART CATH Right 01/27/2014   Procedure: LEFT HEART CATH;  Surgeon: Micheline Chapman, MD;  Location: [facility] CATH LAB;  Service: Cardiovascular;  Laterality: Right;    Prior to Admission medications   Medication Sig Start Date End Date Taking? Authorizing Provider  amoxicillin (AMOXIL) 500 MG capsule Take 1 capsule (500 mg total) by mouth 3 (three) times daily. 06/28/16   Joni Reining, PA-C  atorvastatin (LIPITOR) 40 MG tablet Take 1 tablet (40 mg total) by mouth daily. 03/31/16   Iran Ouch, MD  flecainide (TAMBOCOR) 100 MG tablet Take 1 tablet (100 mg total) by mouth every 12 (twelve) hours. 12/10/15   Iran Ouch, MD  lidocaine (XYLOCAINE) 2 % solution Use as directed 5 mLs in the mouth or throat every 6 (six) hours as needed for mouth pain. 06/28/16   Joni Reining, PA-C  metoprolol (LOPRESSOR) 100 MG tablet Take 1 tablet (100 mg total) by mouth 2 (two) times daily. 03/31/16   Iran Ouch, MD  rivaroxaban (XARELTO) 20 MG TABS tablet Take 1 tablet (20 mg total) by mouth daily with supper. 12/09/15   Iran Ouch, MD  sildenafil (VIAGRA)  25 MG tablet Take 1 tablet (25 mg total) by mouth daily as needed for erectile dysfunction. 01/22/15   Iran Ouch, MD  traMADol (ULTRAM) 50 MG tablet Take 1 tablet (50 mg total) by mouth every 6 (six) hours as needed for moderate pain. 06/28/16   Joni Reining, PA-C    Allergies Patient has no known allergies.  Family History  Problem Relation Age of Onset  . Coronary artery disease Father 68    Social History Social History  Substance Use Topics  . Smoking status: Current Every Day Smoker    Packs/day: 1.00    Years: 30.00    Types: Cigarettes  . Smokeless tobacco: Never Used  . Alcohol use No    Review of  Systems Constitutional: No fever/chills Eyes: No visual changes. ENT: No sore throat. Cardiovascular: Denies chest pain. Respiratory: Denies shortness of breath. Gastrointestinal: No abdominal pain.  No nausea, no vomiting.  No diarrhea.  No constipation. Genitourinary: Negative for dysuria. Musculoskeletal: Negative for back pain. Skin: Negative for rash. Neurological: Negative for headaches, focal weakness or numbness. Endocrine:Hypertension and hyperlipidemia. 10-point ROS otherwise negative.  ____________________________________________   PHYSICAL EXAM:  VITAL SIGNS: ED Triage Vitals  Enc Vitals Group     BP --      Pulse --      Resp --      Temp --      Temp src --      SpO2 --      Weight 06/28/16 2122 170 lb (77.1 kg)     Height 06/28/16 2122 5\' 6"  (1.676 m)     Head Circumference --      Peak Flow --      Pain Score 06/28/16 2123 8     Pain Loc --      Pain Edu? --      Excl. in GC? --     Constitutional: Alert and oriented. Well appearing and in no acute distress. Eyes: Conjunctivae are normal. PERRL. EOMI. Head: Atraumatic. Nose: No congestion/rhinnorhea. Mouth/Throat: Mucous membranes are moist.  Oropharynx non-erythematous.Fractured tooth #3 with mild gingival edema. Neck: No stridor.  No cervical spine tenderness to palpation. Hematological/Lymphatic/Immunilogical: No cervical lymphadenopathy. Cardiovascular: Normal rate, regular rhythm. Grossly normal heart sounds.  Good peripheral circulation. Respiratory: Normal respiratory effort.  No retractions. Lungs CTAB. Gastrointestinal: Soft and nontender. No distention. No abdominal bruits. No CVA tenderness. Musculoskeletal: No lower extremity tenderness nor edema.  No joint effusions. Neurologic:  Normal speech and language. No gross focal neurologic deficits are appreciated. No gait instability. Skin:  Skin is warm, dry and intact. No rash noted. Psychiatric: Mood and affect are normal. Speech and  behavior are normal.  ____________________________________________   LABS (all labs ordered are listed, but only abnormal results are displayed)  Labs Reviewed - No data to display ____________________________________________  EKG   ____________________________________________  RADIOLOGY   ____________________________________________   PROCEDURES  Procedure(s) performed: None  Procedures  Critical Care performed: No  ____________________________________________   INITIAL IMPRESSION / ASSESSMENT AND PLAN / ED COURSE  Pertinent labs & imaging results that were available during my care of the patient were reviewed by me and considered in my medical decision making (see chart for details).  Dental pain secondary to fractured tooth. Patient given discharge Instructions. Patient given a prescription for tramadol, viscous lidocaine, and amoxicillin. Patient advised to follow-up with dentist for definitive evaluation and treatment.      ____________________________________________   FINAL CLINICAL IMPRESSION(S) / ED DIAGNOSES  Final diagnoses:  Pain, dental      NEW MEDICATIONS STARTED DURING THIS VISIT:  New Prescriptions   AMOXICILLIN (AMOXIL) 500 MG CAPSULE    Take 1 capsule (500 mg total) by mouth 3 (three) times daily.   LIDOCAINE (XYLOCAINE) 2 % SOLUTION    Use as directed 5 mLs in the mouth or throat every 6 (six) hours as needed for mouth pain.   TRAMADOL (ULTRAM) 50 MG TABLET    Take 1 tablet (50 mg total) by mouth every 6 (six) hours as needed for moderate pain.     Note:  This document was prepared using Dragon voice recognition software and may include unintentional dictation errors.    Joni Reining, PA-C 06/28/16 2139    Jennye Moccasin, MD 06/28/16 2203

## 2016-07-23 ENCOUNTER — Ambulatory Visit (INDEPENDENT_AMBULATORY_CARE_PROVIDER_SITE_OTHER): Payer: 59 | Admitting: Cardiovascular Disease

## 2016-07-23 ENCOUNTER — Encounter: Payer: Self-pay | Admitting: Cardiovascular Disease

## 2016-07-23 VITALS — BP 114/70 | HR 55 | Ht 66.0 in | Wt 174.0 lb

## 2016-07-23 DIAGNOSIS — E785 Hyperlipidemia, unspecified: Secondary | ICD-10-CM | POA: Diagnosis not present

## 2016-07-23 DIAGNOSIS — Z125 Encounter for screening for malignant neoplasm of prostate: Secondary | ICD-10-CM | POA: Diagnosis not present

## 2016-07-23 DIAGNOSIS — I739 Peripheral vascular disease, unspecified: Secondary | ICD-10-CM | POA: Diagnosis not present

## 2016-07-23 DIAGNOSIS — I480 Paroxysmal atrial fibrillation: Secondary | ICD-10-CM | POA: Diagnosis not present

## 2016-07-23 MED ORDER — ASPIRIN EC 81 MG PO TBEC: 81.0000 mg | DELAYED_RELEASE_TABLET | Freq: Every day | ORAL | 3 refills | Status: DC

## 2016-07-23 NOTE — Progress Notes (Signed)
Cardiology Office Note   Date:  07/23/2016   ID:  Darren Rogers, DOB 12-Dec-1955, MRN [id number]  PCP:  Marguarite Arbour, MD  Cardiologist:   Lorine Bears, MD   Chief Complaint  Patient presents with  . other    Afib C/o chest pain, leg pain and sob. Pt has D/c Xarelto and would like Rx for Viagra. Meds reviewed verbally with pt.      History of Present Illness: Darren Rogers is a 61 y.o. male who presents for a follow-up visit regarding paroxysmal atrial fibrillation and peripheral arterial disease. He has known history of essential hypertension, hypertensive heart disease with highly abnormal EKG, peripheral arterial disease and tobacco use. Previous cardiac catheterization in August 2015 showed mild nonobstructive coronary artery disease. Atrial fibrillation has been well-controlled with flecainide.  He is known to have peripheral arterial disease with mildly reduced ABI bilaterally. Duplex showed diffuse nonobstructive disease. He again stopped Xarelto on his own as he is concerned about side effects and cost. She reports intermittent episodes of substernal chest pain both at rest and with physical activities. These episodes are typically mild and they last less than 1 minute.   Past Medical History:  Diagnosis Date  . Claudication (HCC)   . Erectile dysfunction   . Essential hypertension   . History of echocardiogram    a. 03/29/2014: EF 55-60%, mild LVH, normal RVSP;  b. 05/2015 Echo: EF 60-65%, no rwma, triv AI, mild MR, mildly dil LA.  Marland Kitchen Hyperlipidemia   . Non-obstructive CAD    a. 01/27/2014 Cath: LM nl, LAD mild diff dz w/o obs, LCx no sig obs - scattered 20-30% mLCx, RCA no obs dz, EF 70%  . Paroxysmal atrial fibrillation (HCC)    a. s/p ? ablation procedure ~ 20 yrs ago ([facility]); b. very symptomatic when goes into a-fib w/ RVR;  c. CHA2DS2VASc= 1-->ASA.  . Tobacco abuse    a. ongoing - 1 ppd.    Past Surgical History:  Procedure Laterality Date  . CARDIAC  CATHETERIZATION    . CARD[facility]AC CATHETERIZATION     [facility]  . LEFT HEART CATH Right 01/27/2014   Procedure: LEFT HEART CATH;  Surgeon: Micheline Chapman, MD;  Location: [facility] CATH LAB;  Service: Cardiovascular;  Laterality: Right;     Current Outpatient Prescriptions  Medication Sig Dispense Refill  . atorvastatin (LIPITOR) 40 MG tablet Take 1 tablet (40 mg total) by mouth daily. 30 tablet 6  . flecainide (TAMBOCOR) 100 MG tablet Take 1 tablet (100 mg total) by mouth every 12 (twelve) hours. 180 tablet 3  . metoprolol (LOPRESSOR) 100 MG tablet Take 1 tablet (100 mg total) by mouth 2 (two) times daily. 60 tablet 6  . sildenafil (VIAGRA) 25 MG tablet Take 1 tablet (25 mg total) by mouth daily as needed for erectile dysfunction. 8 tablet 3  . aspirin EC 81 MG tablet Take 1 tablet (81 mg total) by mouth daily. 90 tablet 3   No current facility-administered medications for this visit.     Allergies:   Patient has no known allergies.    Social History:  The patient  reports that he has been smoking Cigarettes.  He has a 30.00 pack-year smoking history. He has never used smokeless tobacco. He reports that he does not drink alcohol or use drugs.   Family History:  The patient's family history includes Coronary artery disease (age of onset: 73) in his father.      PHYSICAL EXAM: VS:  BP 114/70 (BP Location: Left Arm, Patient Position: Sitting, Cuff Size: Normal)   Pulse (!) 55   Ht 5\' 6"  (1.676 m)   Wt 174 lb (78.9 kg)   BMI 28.08 kg/m  , BMI Body mass index is 28.08 kg/m. GEN: Well nourished, well developed, in no acute distress  HEENT: normal  Neck: no JVD, carotid bruits, or masses Cardiac: RRR; no  rubs, or gallops,no edema . There is a 2/6 systolic ejection murmur in the pulmonic area Respiratory:  clear to auscultation bilaterally, normal work of breathing GI: soft, nontender, nondistended, + BS MS: no deformity or atrophy  Skin: warm and dry, no rash Neuro:  Strength and sensation  are intact Psych: euthymic mood, full affect Vascular: Femoral pulses are normal bilaterally. Distal pulses are faint but palpable.   EKG:  EKG is ordered today. The ekg ordered today demonstrates normal sinus rhythm with left ventricular hypertrophy with significant repolarization abnormalities especially in the anterolateral leads. Not change from before.   Recent Labs: 08/22/2015: TSH 1.918 08/23/2015: ALT 26 08/24/2015: BUN 18; Creatinine, Ser 1.02; Hemoglobin 13.4; Platelets 219; Potassium 4.0; Sodium 139    Lipid Panel    Component Value Date/Time   CHOL 150 03/04/2015 0830   CHOL 234 (H) 12/06/2011 0132   TRIG 80 03/04/2015 0830   TRIG 381 (H) 12/06/2011 0132   HDL 42 03/04/2015 0830   HDL 34 (L) 12/06/2011 0132   CHOLHDL 3.6 03/04/2015 0830   CHOLHDL 6.5 11/17/2014 0427   VLDL 29 11/17/2014 0427   VLDL 76 (H) 12/06/2011 0132   LDLCALC 92 03/04/2015 0830   LDLCALC 124 (H) 12/06/2011 0132      Wt Readings from Last 3 Encounters:  07/23/16 174 lb (78.9 kg)  06/28/16 170 lb (77.1 kg)  03/31/16 171 lb 8 oz (77.8 kg)         ASSESSMENT AND PLAN:  1.  Paroxysmal atrial fibrillation :  He is maintaining in sinus rhythm with flecainide and metoprolol with no side effects.  CHADS VASc score is 2 (HTN and PAD).  He again stopped taking Xarelto on his own and doesn't want anticoagulation at the present time. I switched him to aspirin 81 mg once daily. We can rediscuss this with him in the future.  2. Benign prostatic hypertrophy: The patient did not schedule an appointment with urology as he is concerned about cost. I'm going to check PSA.  3. Essential hypertension: Blood pressure is controlled on metoprolol.  4. Tobacco use: discussed with him the importance of smoking cessation.   5. Hyperlipidemia: Continue treatment with atorvastatin. I requested lipid and liver profile  6. Peripheral arterial disease: Mild not lifestyle limiting claudication with mildly  reduced ABI. Continue medical therapy.  I again advised the patient to establish with a primary care physician.  Disposition:   FU with me in 6 months  Signed,  Lorine Bears, MD  07/23/2016 6:22 PM    [facility]

## 2016-07-23 NOTE — Patient Instructions (Signed)
Medication Instructions:  Your physician has recommended you make the following change in your medication:  STOP taking xarelto START taking aspirin 81mg  once daily   Labwork: Lipid, liver, PSA today  Testing/Procedures: none  Follow-Up: Your physician wants you to follow-up in: six months with Dr. Kirke Corin.  You will receive a reminder letter in the mail two months in advance. If you don't receive a letter, please call our office to schedule the follow-up appointment.   Any Other Special Instructions Will Be Listed Below (If Applicable).     If you need a refill on your cardiac medications before your next appointment, please call your pharmacy.

## 2016-07-24 LAB — LIPID PANEL
CHOL/HDL RATIO: 4.7 (ref 0.0–5.0)
Cholesterol, Total: 198 mg/dL (ref 100–199)
HDL: 42 mg/dL (ref >39)
LDL CALC: 139 — AB (ref 0–99)
Triglycerides: 87 mg/dL (ref 0–149)
VLDL Cholesterol Cal: 17 (ref 5–40)

## 2016-07-24 LAB — HEPATIC FUNCTION PANEL
ALT: 20 IU/L (ref 0–44)
AST: 19 IU/L (ref 0–40)
Albumin: 4.3 g/dL (ref 3.6–4.8)
Alkaline Phosphatase: 70 IU/L (ref 39–117)
BILIRUBIN, DIRECT: 0.06 mg/dL (ref 0.00–0.40)
Bilirubin Total: 0.2 mg/dL (ref 0.0–1.2)
TOTAL PROTEIN: 6.6 g/dL (ref 6.0–8.5)

## 2016-07-24 LAB — PSA: Prostate Specific Ag, Serum: 1.1 ng/mL (ref 0.0–4.0)

## 2016-07-29 ENCOUNTER — Other Ambulatory Visit: Payer: Self-pay

## 2016-07-29 DIAGNOSIS — E785 Hyperlipidemia, unspecified: Secondary | ICD-10-CM

## 2016-07-29 MED ORDER — ROSUVASTATIN CALCIUM 40 MG PO TABS: 40.0000 mg | ORAL_TABLET | Freq: Every day | ORAL | 3 refills | Status: DC

## 2016-07-30 ENCOUNTER — Ambulatory Visit (INDEPENDENT_AMBULATORY_CARE_PROVIDER_SITE_OTHER): Payer: 59 | Admitting: Family Medicine

## 2016-07-30 ENCOUNTER — Encounter: Payer: Self-pay | Admitting: Family Medicine

## 2016-07-30 VITALS — BP 138/76 | HR 56 | Temp 98.4°F | Resp 17 | Ht 66.0 in | Wt 173.0 lb

## 2016-07-30 DIAGNOSIS — R739 Hyperglycemia, unspecified: Secondary | ICD-10-CM | POA: Diagnosis not present

## 2016-07-30 DIAGNOSIS — Z23 Encounter for immunization: Secondary | ICD-10-CM

## 2016-07-30 DIAGNOSIS — I2510 Atherosclerotic heart disease of native coronary artery without angina pectoris: Secondary | ICD-10-CM

## 2016-07-30 DIAGNOSIS — I480 Paroxysmal atrial fibrillation: Secondary | ICD-10-CM | POA: Diagnosis not present

## 2016-07-30 DIAGNOSIS — E782 Mixed hyperlipidemia: Secondary | ICD-10-CM

## 2016-07-30 DIAGNOSIS — Z720 Tobacco use: Secondary | ICD-10-CM

## 2016-07-30 DIAGNOSIS — N401 Enlarged prostate with lower urinary tract symptoms: Secondary | ICD-10-CM | POA: Insufficient documentation

## 2016-07-30 DIAGNOSIS — R351 Nocturia: Secondary | ICD-10-CM | POA: Diagnosis not present

## 2016-07-30 DIAGNOSIS — M5432 Sciatica, left side: Secondary | ICD-10-CM

## 2016-07-30 DIAGNOSIS — I10 Essential (primary) hypertension: Secondary | ICD-10-CM | POA: Diagnosis not present

## 2016-07-30 DIAGNOSIS — R7301 Impaired fasting glucose: Secondary | ICD-10-CM | POA: Diagnosis not present

## 2016-07-30 MED ORDER — BUPROPION HCL ER (SR) 150 MG PO TB12: ORAL_TABLET | ORAL | 3 refills | Status: DC

## 2016-07-30 MED ORDER — TAMSULOSIN HCL 0.4 MG PO CAPS: 0.4000 mg | ORAL_CAPSULE | Freq: Every day | ORAL | 3 refills | Status: DC

## 2016-07-30 NOTE — Assessment & Plan Note (Signed)
Will start him on flomax. PSA normal. Recheck 1 month. Call with any concerns.

## 2016-07-30 NOTE — Assessment & Plan Note (Signed)
Under good control. Continue current regimen. Continue to monitor. Call with any concerns. 

## 2016-07-30 NOTE — Progress Notes (Signed)
BP 138/76 (BP Location: Left Arm, Patient Position: Sitting, Cuff Size: Normal)   Pulse (!) 56   Temp 98.4 F (36.9 C) (Oral)   Resp 17   Ht 5\' 6"  (1.676 m)   Wt 173 lb (78.5 kg)   SpO2 97%   BMI 27.92 kg/m    Subjective:    Patient ID: Darren Rogers, male    DOB: February 06, 1956, 61 y.o.   MRN: [id number]  HPI: Darren Rogers is a 61 y.o. male  Chief Complaint  Patient presents with  . Establish Care   BPH- has been having a lot of issues with his urine. Peeing frequently. Has been going on for about 2 years BPH status: exacerbated Satisfied with current treatment?: no Medication side effects: Not on anything Duration: About 2 years Nocturia: 2-3x per night Urinary frequency:yes Incomplete voiding: no Urgency: yes Weak urinary stream: no Straining to start stream: no Dysuria: no Onset: gradual Severity: moderate  HYPERTENSION / HYPERLIPIDEMIA- follows with Dr. Kirke Corin. Just had his cholesterol checked and was not fasting. Due to go back on Monday to have recheck.  Satisfied with current treatment? yes Duration of hypertension: chronic BP monitoring frequency: not checking BP medication side effects: no Duration of hyperlipidemia: chronic Cholesterol medication side effects: no Cholesterol supplements: none Past cholesterol medications: rosuvastatin (crestor) Medication compliance: excellent compliance Aspirin: yes Recent stressors: no Recurrent headaches: no Visual changes: no Palpitations: no Dyspnea: no Chest pain: no Lower extremity edema: no Dizzy/lightheaded: no  SMOKING CESSATION Smoking Status: Current every day smoker Smoking Amount: 1ppd Smoking Onset: 30 years Smoking Quit Date: Not set Smoking triggers: after meal, after sex, doing nothing.  Type of tobacco use: cigarettes Children in the house: no Other household members who smoke: yes Treatments attempted: cold [place] Pneumovax: given today Hx of Low-dose CT: No- ordered today  Has been  having some numbness on the L side when he stands for a long period of time. No pain. Goes away when he's moving around a bit more. He had sciatica in the past and saw chiropractic previously.  Active Ambulatory Problems    Diagnosis Date Noted  . Tobacco abuse   . Hyperlipidemia   . Paroxysmal atrial fibrillation (HCC)   . CAD in native artery 04/11/2014  . 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 Claudication (HCC)   . Essential hypertension   . BPH associated with nocturia 07/30/2016  . IFG (impaired fasting glucose) 07/30/2016   Resolved Ambulatory Problems    Diagnosis Date Noted  . Atrial fibrillation with rapid ventricular response (HCC) 01/27/2014  . STEMI (ST elevation myocardial infarction) (HCC) 01/29/2014  . CAD (coronary artery disease) 04/11/2014  . History of echocardiogram   . CVA (cerebral infarction) 11/16/2014  . Shortness of breath 04/22/2015  . Chest pain 08/22/2015  . Smoker    Past Medical History:  Diagnosis Date  . Atrial fibrillation with rapid ventricular response (HCC) 01/27/2014  . Claudication (HCC)   . Erectile dysfunction   . Essential hypertension   . History of echocardiogram   . Hyperlipidemia   . Non-obstructive CAD   . Paroxysmal atrial fibrillation (HCC)   . STEMI (ST elevation myocardial infarction) (HCC) 01/29/2014  . Tobacco abuse    Past Surgical History:  Procedure Laterality Date  . CARDIAC CATHETERIZATION    . CARDIAC CATHETERIZATION     [facility]  . LEFT HEART CATH Right 01/27/2014   Procedure: LEFT HEART CATH;  Surgeon: Micheline Chapman, MD;  Location: [facility] CATH LAB;  Service: Cardiovascular;  Laterality: Right;   Outpatient Encounter Prescriptions as of 07/30/2016  Medication Sig  . aspirin EC 81 MG tablet Take 1 tablet (81 mg total) by mouth daily.  Marland Kitchen docusate sodium (COLACE) 100 MG capsule Take 100 mg by mouth daily as needed for mild constipation.  . flecainide (TAMBOCOR) 100 MG tablet Take 1 tablet (100 mg total) by  mouth every 12 (twelve) hours.  . metoprolol (LOPRESSOR) 100 MG tablet Take 1 tablet (100 mg total) by mouth 2 (two) times daily.  . rosuvastatin (CRESTOR) 40 MG tablet Take 1 tablet (40 mg total) by mouth daily.  . sildenafil (VIAGRA) 25 MG tablet Take 1 tablet (25 mg total) by mouth daily as needed for erectile dysfunction.  Marland Kitchen buPROPion (WELLBUTRIN SR) 150 MG 12 hr tablet 1 tab qAM for 3 days, then 1 tab BID after that. Pick a day in the 2nd week  . tamsulosin (FLOMAX) 0.4 MG CAPS capsule Take 1 capsule (0.4 mg total) by mouth daily.   No facility-administered encounter medications on file as of 07/30/2016.    No Known Allergies  Social History   Social History  . Marital status: Single    Spouse name: N/A  . Number of children: N/A  . Years of education: N/A   Occupational History  . Not on file.   Social History Main Topics  . Smoking status: Current Every Day Smoker    Packs/day: 1.00    Years: 30.00    Types: Cigarettes  . Smokeless tobacco: Never Used  . Alcohol use No  . Drug use: No  . Sexual activity: Not on file   Other Topics Concern  . Not on file   Social History Narrative   The patient lives in [place] with his sister. He works in a substance abuse program. 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 He is a long-time smoker, one pack per day. No illicit drugs. He is not married.   Family History  Problem Relation Age of Onset  . Coronary artery disease Father 54  . Diabetes Mother   . Diabetes Sister   . Diabetes Brother   . Diabetes Maternal Grandmother   . Diabetes Sister   . Diabetes Sister     Review of Systems  Constitutional: Negative.   Respiratory: Negative.   Cardiovascular: Negative.   Genitourinary: Positive for frequency. Negative for decreased urine volume, difficulty urinating, discharge, dysuria, enuresis, flank pain, genital sores, hematuria, penile pain, penile swelling, scrotal swelling,  testicular pain and urgency.  Psychiatric/Behavioral: Negative.     Per HPI unless specifically indicated above     Objective:    BP 138/76 (BP Location: Left Arm, Patient Position: Sitting, Cuff Size: Normal)   Pulse (!) 56   Temp 98.4 F (36.9 C) (Oral)   Resp 17   Ht 5\' 6"  (1.676 m)   Wt 173 lb (78.5 kg)   SpO2 97%   BMI 27.92 kg/m   Wt Readings from Last 3 Encounters:  07/30/16 173 lb (78.5 kg)  07/23/16 174 lb (78.9 kg)  06/28/16 170 lb (77.1 kg)    Physical Exam  Constitutional: He is oriented to person, place, and time. He appears well-developed and well-nourished. No distress.  HENT:  Head: Normocephalic and atraumatic.  Right Ear: Hearing normal.  Left Ear: Hearing normal.  Nose: Nose normal.  Eyes: Conjunctivae and lids are normal. Right eye exhibits no discharge. Left eye exhibits no discharge.  No scleral icterus.  Cardiovascular: Normal rate, regular rhythm, normal heart sounds and intact distal pulses.  Exam reveals no gallop and no friction rub.   No murmur heard. Pulmonary/Chest: Effort normal and breath sounds normal. No respiratory distress. He has no wheezes. He has no rales. He exhibits no tenderness.  Musculoskeletal: Normal range of motion.  Neurological: He is alert and oriented to person, place, and time.  Skin: Skin is warm, dry and intact. No rash noted. No erythema. No pallor.  Psychiatric: He has a normal mood and affect. His speech is normal and behavior is normal. Judgment and thought content normal. Cognition and memory are normal.  Nursing note and vitals reviewed.   Results for orders placed or performed in visit on 07/23/16  Lipid panel  Result Value Ref Range   Cholesterol, Total 198 100 - 199 mg/dL   Triglycerides 87 0 - 149 mg/dL   HDL 42 >16 mg/dL   VLDL Cholesterol Cal 17 5 - 40   LDL Calculated 139 (H) 0 - 99   Chol/HDL Ratio 4.7 0.0 - 5.0  Hepatic function panel  Result Value Ref Range   Total Protein 6.6 6.0 - 8.5 g/dL    Albumin 4.3 3.6 - 4.8 g/dL   Bilirubin Total 0.2 0.0 - 1.2 mg/dL   Bilirubin, Direct 1.09 0.00 - 0.40 mg/dL   Alkaline Phosphatase 70 39 - 117 IU/L   AST 19 0 - 40 IU/L   ALT 20 0 - 44 IU/L  PSA  Result Value Ref Range   Prostate Specific Ag, Serum 1.1 0.0 - 4.0 ng/mL      Assessment & Plan:   Problem List Items Addressed This Visit      Cardiovascular and Mediastinum   Paroxysmal atrial fibrillation (HCC)    Continue to follow with cardiology. Call with any concerns.       CAD in native artery    No pain. Continue to follow with cardiology. Call with any concerns.       Essential hypertension    Under good control. Continue current regimen. Continue to monitor. Call with any concerns.       Relevant Orders   Basic metabolic panel   Microalbumin, Urine Waived   TSH     Endocrine   IFG (impaired fasting glucose)    A1c 6.1- work on diet and exercise. Call with any concerns.         Genitourinary   BPH associated with nocturia - Primary    Will start him on flomax. PSA normal. Recheck 1 month. Call with any concerns.       Relevant Medications   tamsulosin (FLOMAX) 0.4 MG CAPS capsule   Other Relevant Orders   Basic metabolic panel   UA/M w/rflx Culture, Routine     Other   Tobacco abuse    Will get low dose CT- message to [facility] sent today. Will start wellbutrin for smoking cessation. Pneumovax given today. Recheck 1 month.       Relevant Orders   Basic metabolic panel   UA/M w/rflx Culture, Routine   Pneumococcal polysaccharide vaccine 23-valent greater than or equal to 2yo subcutaneous/IM (Completed)   Hyperlipidemia    Monitored by cardiology. Await results. Call with any concerns.       Relevant Orders   Basic metabolic panel    Other Visit Diagnoses    Sciatica of left side       Exercises given. Recheck 1 month. Call if not getting  better.    Relevant Medications   buPROPion (WELLBUTRIN SR) 150 MG 12 hr tablet   Other Relevant Orders   Basic  metabolic panel   Elevated blood sugar       Will check A1c   Relevant Orders   Bayer DCA Hb A1c Waived   Basic metabolic panel   UA/M w/rflx Culture, Routine       Follow up plan: Return in about 4 weeks (around 08/27/2016) for BPH/Smoking follow up with [facility].

## 2016-07-30 NOTE — Assessment & Plan Note (Signed)
A1c 6.1- work on diet and exercise. Call with any concerns.

## 2016-07-30 NOTE — Assessment & Plan Note (Signed)
Continue to follow with cardiology. Call with any concerns.  

## 2016-07-30 NOTE — Assessment & Plan Note (Signed)
Will get low dose CT- message to [facility] sent today. Will start wellbutrin for smoking cessation. Pneumovax given today. Recheck 1 month.

## 2016-07-30 NOTE — Assessment & Plan Note (Signed)
No pain. Continue to follow with cardiology. Call with any concerns.

## 2016-07-30 NOTE — Assessment & Plan Note (Signed)
Monitored by cardiology. Await results. Call with any concerns.

## 2016-07-30 NOTE — Patient Instructions (Addendum)
Sciatica Rehab Ask your health care provider which exercises are safe for you. Do exercises exactly as told by your health care provider and adjust them as directed. It is normal to feel mild stretching, pulling, tightness, or discomfort as you do these exercises, but you should stop right away if you feel sudden pain or your pain gets worse.Do not begin these exercises until told by your health care provider. Stretching and range of motion exercises These exercises warm up your muscles and joints and improve the movement and flexibility of your hips and your back. These exercises also help to relieve pain, numbness, and tingling. Exercise A: Sciatic nerve glide  1. Sit in a chair with your head facing down toward your chest. Place your hands behind your back. Let your shoulders slump forward. 2. Slowly straighten one of your knees while you tilt your head back as if you are looking toward the ceiling. Only straighten your leg as far as you can without making your symptoms worse. 3. Hold for __________ seconds. 4. Slowly return to the starting position. 5. Repeat with your other leg. Repeat __________ times. Complete this exercise __________ times a day. Exercise B: Knee to chest with hip adduction and internal rotation    1. Lie on your back on a firm surface with both legs straight. 2. Bend one of your knees and move it up toward your chest until you feel a gentle stretch in your lower back and buttock. Then, move your knee toward the shoulder that is on the opposite side from your leg. ? Hold your leg in this position by holding onto the front of your knee. 3. Hold for __________ seconds. 4. Slowly return to the starting position. 5. Repeat with your other leg. Repeat __________ times. Complete this exercise __________ times a day. Exercise C: Prone extension on elbows    1. Lie on your abdomen on a firm surface. A bed may be too soft for this exercise. 2. Prop yourself up on your  elbows. 3. Use your arms to help lift your chest up until you feel a gentle stretch in your abdomen and your lower back. ? This will place some of your body weight on your elbows. If this is uncomfortable, try stacking pillows under your chest. ? Your hips should stay down, against the surface that you are lying on. Keep your hip and back muscles relaxed. 4. Hold for __________ seconds. 5. Slowly relax your upper body and return to the starting position. Repeat __________ times. Complete this exercise __________ times a day. Strengthening exercises These exercises build strength and endurance in your back. Endurance is the ability to use your muscles for a long time, even after they get tired. Exercise D: Pelvic tilt  1. Lie on your back on a firm surface. Bend your knees and keep your feet flat. 2. Tense your abdominal muscles. Tip your pelvis up toward the ceiling and flatten your lower back into the floor. ? To help with this exercise, you may place a small towel under your lower back and try to push your back into the towel. 3. Hold for __________ seconds. 4. Let your muscles relax completely before you repeat this exercise. Repeat __________ times. Complete this exercise __________ times a day. Exercise E: Alternating arm and leg raises    1. Get on your hands and knees on a firm surface. If you are on a hard floor, you may want to use padding to cushion your knees, such as   an exercise mat. 2. Line up your arms and legs. Your hands should be below your shoulders, and your knees should be below your hips. 3. Lift your left leg behind you. At the same time, raise your right arm and straighten it in front of you. ? Do not lift your leg higher than your hip. ? Do not lift your arm higher than your shoulder. ? Keep your abdominal and back muscles tight. ? Keep your hips facing the ground. ? Do not arch your back. ? Keep your balance carefully, and do not hold your breath. 4. Hold for  __________ seconds. 5. Slowly return to the starting position and repeat with your right leg and your left arm. Repeat __________ times. Complete this exercise __________ times a day. Posture and body mechanics    Body mechanics refers to the movements and positions of your body while you do your daily activities. Posture is part of body mechanics. Good posture and healthy body mechanics can help to relieve stress in your body's tissues and joints. Good posture means that your spine is in its natural S-curve position (your spine is neutral), your shoulders are pulled back slightly, and your head is not tipped forward. The following are general guidelines for applying improved posture and body mechanics to your everyday activities. Standing     When standing, keep your spine neutral and your feet about hip-width apart. Keep a slight bend in your knees. Your ears, shoulders, and hips should line up.  When you do a task in which you stand in one place for a long time, place one foot up on a stable object that is 2-4 inches (5-10 cm) high, such as a footstool. This helps keep your spine neutral. Sitting     When sitting, keep your spine neutral and keep your feet flat on the floor. Use a footrest, if necessary, and keep your thighs parallel to the floor. Avoid rounding your shoulders, and avoid tilting your head forward.  When working at a desk or a computer, keep your desk at a height where your hands are slightly lower than your elbows. Slide your chair under your desk so you are close enough to maintain good posture.  When working at a computer, place your monitor at a height where you are looking straight ahead and you do not have to tilt your head forward or downward to look at the screen. Resting     When lying down and resting, avoid positions that are most painful for you.  If you have pain with activities such as sitting, bending, stooping, or squatting (flexion-based  activities), lie in a position in which your body does not bend very much. For example, avoid curling up on your side with your arms and knees near your chest (fetal position).  If you have pain with activities such as standing for a long time or reaching with your arms (extension-based activities), lie with your spine in a neutral position and bend your knees slightly. Try the following positions: ? Lying on your side with a pillow between your knees. ? Lying on your back with a pillow under your knees. Lifting     When lifting objects, keep your feet at least shoulder-width apart and tighten your abdominal muscles.  Bend your knees and hips and keep your spine neutral. It is important to lift using the strength of your legs, not your back. Do not lock your knees straight out.  Always ask for help to lift heavy   to replace advice given to you by your health care provider. Make sure you discuss any questions you have with your health care provider. Document Released: 05/18/2005 Document Revised: 01/23/2016 Document Reviewed: 02/01/2015 Elsevier Interactive Patient Education  2017 [employer]  Benign Prostatic Hypertrophy The prostate gland is part of the reproductive system of men. A normal prostate is about the size and shape of a walnut. The prostate gland produces a fluid that is mixed with sperm to make semen. This gland surrounds the urethra and is located in front of the rectum and just below the bladder. The bladder is where urine is stored. The urethra is the tube through which urine passes from the bladder to get out of the body. The prostate grows as a man ages. An enlarged prostate not caused by cancer is called benign prostatic hypertrophy (BPH). An enlarged prostate can press on the urethra. This can make it harder to pass urine. In the early stages of enlargement, the bladder can get by with a narrowed urethra by forcing the urine through. If the  problem gets worse, medical or surgical treatment may be required. This condition should be followed by your health care provider. The accumulation of urine in the bladder can cause infection. Back pressure and infection can progress to bladder damage and kidney (renal) failure. If needed, your health care provider may refer you to a specialist in kidney and prostate disease (urologist). What are the causes? BPH is a common health problem in men older than 50 years. This condition is a normal part of aging. However, not all men will develop problems from this condition. If the enlargement grows away from the urethra, then there will not be any compression of the urethra and resistance to urine flow.If the growth is toward the urethra and compresses it, you will experience difficulty urinating. What are the signs or symptoms?  Not able to completely empty your bladder.  Getting up often during the night to urinate.  Need to urinate frequently during the day.  Difficultly starting urine flow.  Decrease in size and strength of your urine stream.  Dribbling after urination.  Pain on urination (more common with infection).  Inability to pass urine. This needs immediate treatment.  The development of a urinary tract infection. How is this diagnosed? These tests will help your health care provider understand your problem:  A thorough history and physical examination.  A urination history, with the number of times you urinate, the amounts of urine, the strength of the urine stream, and the feeling of emptiness or fullness after urinating.  A postvoid bladder scan that measures any amount of urine that may remain in your bladder after you finish urinating.  Digital rectal exam. In a rectal exam, your health care provider checks your prostate by putting a gloved, lubricated finger into your rectum to feel the back of your prostate gland. This exam detects the size of your gland and abnormal  lumps or growths.  Exam of your urine (urinalysis).  Prostate specific antigen (PSA) screening. This is a blood test used to screen for prostate cancer.  Rectal ultrasonography. This test uses sound waves to electronically produce a picture of your prostate gland. How is this treated? Once symptoms begin, your health care provider will monitor your condition. Of the men with this condition, one third will have symptoms that stabilize, one third will have symptoms that improve, and one third will have symptoms that progress in the first year. Mild symptoms may  not need treatment. Simple observation and yearly exams may be all that is required. Medicines and surgery are options for more severe problems. Your health care provider can help you make an informed decision for what is best. Two classes of medicines are available for relief of prostate symptoms:  Medicines that shrink the prostate. This helps relieve symptoms. These medicines take time to work, and it may be months before any improvement is seen.  Uncommon side effects include problems with sexual function.  Medicines to relax the muscle of the prostate. This also relieves the obstruction by reducing any compression on the urethra.This group of medicines work much faster than those that reduce the size of the prostate gland. Usually, one can experience improvement in days to weeks..  Side effects can include dizziness, fatigue, lightheadedness, and retrograde ejaculation (diminished volume of ejaculate). Several types of surgical treatments are available for relief of prostate symptoms:  Transurethral resection of the prostate (TURP)-In this treatment, an instrument is inserted through opening at the tip of the penis. It is used to cut away pieces of the inner core of the prostate. The pieces are removed through the same opening of the penis. This removes the obstruction and helps get rid of the symptoms.  Transurethral incision  (TUIP)-In this procedure, small cuts are made in the prostate. This lessens the prostates pressure on the urethra.  Transurethral microwave thermotherapy (TUMT)-This procedure uses microwaves to create heat. The heat destroys and removes a small amount of prostate tissue.  Transurethral needle ablation (TUNA)-This is a procedure that uses radio frequencies to do the same as TUMT.  Interstitial laser coagulation (ILC)-This is a procedure that uses a laser to do the same as TUMT and TUNA.  Transurethral electrovaporization (TUVP)-This is a procedure that uses electrodes to do the same as the procedures listed above. Contact a health care provider if:  You develop a fever.  There is unexplained back pain.  Symptoms are not helped by medicines prescribed.  You develop side effects from the medicine you are taking.  Your urine becomes very dark or has a bad smell.  Your lower abdomen becomes distended and you have difficulty passing your urine. Get help right away if:  You are suddenly unable to urinate. This is an emergency. You should be seen immediately.  There are large amounts of blood or clots in the urine.  Your urinary problems become unmanageable.  You develop lightheadedness, severe dizziness, or you feel faint.  You develop moderate to severe low back or flank pain.  You develop chills or fever. This information is not intended to replace advice given to you by your health care provider. Make sure you discuss any questions you have with your health care provider. Document Released: 05/18/2005 Document Revised: 10/30/2015 Document Reviewed: 12/01/2012 Elsevier Interactive Patient Education  2017 [employer] Pneumococcal Polysaccharide Vaccine: What You Need to Know 1. Why get vaccinated? Vaccination can protect older adults (and some children and younger adults) from pneumococcal disease. Pneumococcal disease is caused by bacteria that can spread from person to person  through close contact. It can cause ear infections, and it can also lead to more serious infections of the:  Lungs (pneumonia),  Blood (bacteremia), and  Covering of the brain and spinal cord (meningitis). Meningitis can cause deafness and brain damage, and it can be fatal. Anyone can get pneumococcal disease, but children under 110 years of age, people with certain medical conditions, adults over 77 years of age, and cigarette smokers  are at the highest risk. About 18,000 older adults die each year from pneumococcal disease in the [place]. Treatment of pneumococcal infections with penicillin and other drugs used to be more effective. But some strains of the disease have become resistant to these drugs. This makes prevention of the disease, through vaccination, even more important. 2. Pneumococcal polysaccharide vaccine (PPSV23) Pneumococcal polysaccharide vaccine (PPSV23) protects against 23 types of pneumococcal bacteria. It will not prevent all pneumococcal disease. PPSV23 is recommended for:  All adults 37 years of age and older,  Anyone 2 through 62 years of age with certain long-term health problems,  Anyone 2 through 62 years of age with a weakened immune system,  Adults 66 through 62 years of age who smoke cigarettes or have asthma. Most people need only one dose of PPSV. A second dose is recommended for certain high-risk groups. People 44 and older should get a dose even if they have gotten one or more doses of the vaccine before they turned 65. Your healthcare provider can give you more information about these recommendations. Most healthy adults develop protection within 2 to 3 weeks of getting the shot. 3. Some people should not get this vaccine  Anyone who has had a life-threatening allergic reaction to PPSV should not get another dose.  Anyone who has a severe allergy to any component of PPSV should not receive it. Tell your provider if you have any severe  allergies.  Anyone who is moderately or severely ill when the shot is scheduled may be asked to wait until they recover before getting the vaccine. Someone with a mild illness can usually be vaccinated.  Children less than 15 years of age should not receive this vaccine.  There is no evidence that PPSV is harmful to either a pregnant woman or to her fetus. However, as a precaution, women who need the vaccine should be vaccinated before becoming pregnant, if possible. 4. Risks of a vaccine reaction With any medicine, including vaccines, there is a chance of side effects. These are usually mild and go away on their own, but serious reactions are also possible. About half of people who get PPSV have mild side effects, such as redness or pain where the shot is given, which go away within about two days. Less than 1 out of 100 people develop a fever, muscle aches, or more severe local reactions. Problems that could happen after any vaccine:   People sometimes faint after a medical procedure, including vaccination. Sitting or lying down for about 15 minutes can help prevent fainting, and injuries caused by a fall. Tell your doctor if you feel dizzy, or have vision changes or ringing in the ears.  Some people get severe pain in the shoulder and have difficulty moving the arm where a shot was given. This happens very rarely.  Any medication can cause a severe allergic reaction. Such reactions from a vaccine are very rare, estimated at about 1 in a million doses, and would happen within a few minutes to a few hours after the vaccination. As with any medicine, there is a very remote chance of a vaccine causing a serious injury or death. The safety of vaccines is always being monitored. For more information, visit: http://floyd.org/ 5. What if there is a serious reaction? What should I look for?  Look for anything that concerns you, such as signs of a severe allergic reaction, very high fever, or  unusual behavior. Signs of a severe allergic reaction can include hives,  swelling of the face and throat, difficulty breathing, a fast heartbeat, dizziness, and weakness. These would usually start a few minutes to a few hours after the vaccination. What should I do?  If you think it is a severe allergic reaction or other emergency that can't wait, call 9-1-1 or get to the nearest hospital. Otherwise, call your doctor. Afterward, the reaction should be reported to the Vaccine Adverse Event Reporting System (VAERS). Your doctor might file this report, or you can do it yourself through the VAERS web site at www.vaers.LAgents.no, or by calling 1-(934) 345-2887. VAERS does not give medical advice.  6. How can I learn more?  Ask your doctor. He or she can give you the vaccine package insert or suggest other sources of information.  Call your local or state health department.  Contact the Centers for Disease Control and Prevention (CDC):  Call 438 059 0575 (1-800-CDC-INFO) or  Visit CDC's website at PicCapture.uy CDC Pneumococcal Polysaccharide Vaccine VIS (09/22/13) This information is not intended to replace advice given to you by your health care provider. Make sure you discuss any questions you have with your health care provider. Document Released: 03/15/2006 Document Revised: 02/06/2016 Document Reviewed: 02/06/2016 Elsevier Interactive Patient Education  2017 [employer] Inc. Diabetes Mellitus and Food It is important for you to manage your blood sugar (glucose) level. Your blood glucose level can be greatly affected by what you eat. Eating healthier foods in the appropriate amounts throughout the day at about the same time each day will help you control your blood glucose level. It can also help slow or prevent worsening of your diabetes mellitus. Healthy eating may even help you improve the level of your blood pressure and reach or maintain a healthy weight. General recommendations for  healthful eating and cooking habits include:  Eating meals and snacks regularly. Avoid going long periods of time without eating to lose weight.  Eating a diet that consists mainly of plant-based foods, such as fruits, vegetables, nuts, legumes, and whole grains.  Using low-heat cooking methods, such as baking, instead of high-heat cooking methods, such as deep frying. Work with your dietitian to make sure you understand how to use the Nutrition Facts information on food labels. How can food affect me? Carbohydrates  Carbohydrates affect your blood glucose level more than any other type of food. Your dietitian will help you determine how many carbohydrates to eat at each meal and teach you how to count carbohydrates. Counting carbohydrates is important to keep your blood glucose at a healthy level, especially if you are using insulin or taking certain medicines for diabetes mellitus. Alcohol  Alcohol can cause sudden decreases in blood glucose (hypoglycemia), especially if you use insulin or take certain medicines for diabetes mellitus. Hypoglycemia can be a life-threatening condition. Symptoms of hypoglycemia (sleepiness, dizziness, and disorientation) are similar to symptoms of having too much alcohol. If your health care provider has given you approval to drink alcohol, do so in moderation and use the following guidelines:  Women should not have more than one drink per day, and men should not have more than two drinks per day. One drink is equal to:  12 oz of beer.  5 oz of wine.  1 oz of hard liquor.  Do not drink on an empty stomach.  Keep yourself hydrated. Have water, diet soda, or unsweetened iced tea.  Regular soda, juice, and other mixers might contain a lot of carbohydrates and should be counted. What foods are not recommended? As you make food  choices, it is important to remember that all foods are not the same. Some foods have fewer nutrients per serving than other foods,  even though they might have the same number of calories or carbohydrates. It is difficult to get your body what it needs when you eat foods with fewer nutrients. Examples of foods that you should avoid that are high in calories and carbohydrates but low in nutrients include:  Trans fats (most processed foods list trans fats on the Nutrition Facts label).  Regular soda.  Juice.  Candy.  Sweets, such as cake, pie, doughnuts, and cookies.  Fried foods. What foods can I eat? Eat nutrient-rich foods, which will nourish your body and keep you healthy. The food you should eat also will depend on several factors, including:  The calories you need.  The medicines you take.  Your weight.  Your blood glucose level.  Your blood pressure level.  Your cholesterol level. You should eat a variety of foods, including:  Protein.  Lean cuts of meat.  Proteins low in saturated fats, such as fish, egg whites, and beans. Avoid processed meats.  Fruits and vegetables.  Fruits and vegetables that may help control blood glucose levels, such as apples, mangoes, and yams.  Dairy products.  Choose fat-free or low-fat dairy products, such as milk, yogurt, and cheese.  Grains, bread, pasta, and rice.  Choose whole grain products, such as multigrain bread, whole oats, and brown rice. These foods may help control blood pressure.  Fats.  Foods containing healthful fats, such as nuts, avocado, olive oil, canola oil, and fish. Does everyone with diabetes mellitus have the same meal plan? Because every person with diabetes mellitus is different, there is not one meal plan that works for everyone. It is very important that you meet with a dietitian who will help you create a meal plan that is just right for you. This information is not intended to replace advice given to you by your health care provider. Make sure you discuss any questions you have with your health care provider. Document Released:  02/12/2005 Document Revised: 10/24/2015 Document Reviewed: 04/14/2013 Elsevier Interactive Patient Education  2017 [employer].

## 2016-07-31 ENCOUNTER — Encounter: Payer: Self-pay | Admitting: Family Medicine

## 2016-07-31 ENCOUNTER — Telehealth: Payer: Self-pay | Admitting: *Deleted

## 2016-07-31 DIAGNOSIS — Z87891 Personal history of nicotine dependence: Secondary | ICD-10-CM

## 2016-07-31 LAB — UA/M W/RFLX CULTURE, ROUTINE
BILIRUBIN UA: NEGATIVE
GLUCOSE, UA: NEGATIVE
Leukocytes, UA: NEGATIVE
Nitrite, UA: NEGATIVE
Protein, UA: NEGATIVE
RBC, UA: NEGATIVE
Specific Gravity, UA: 1.025 (ref 1.005–1.030)
UUROB: 0.2 mg/dL (ref 0.2–1.0)
pH, UA: 6 (ref 5.0–7.5)

## 2016-07-31 LAB — BASIC METABOLIC PANEL
BUN / CREAT RATIO: 16 (ref 10–24)
BUN: 18 mg/dL (ref 8–27)
CALCIUM: 9.4 mg/dL (ref 8.6–10.2)
CHLORIDE: 102 mmol/L (ref 96–106)
CO2: 25 mmol/L (ref 18–29)
Creatinine, Ser: 1.16 mg/dL (ref 0.76–1.27)
GFR, EST AFRICAN AMERICAN: 78 mL/min/{1.73_m2} (ref >59)
GFR, EST NON AFRICAN AMERICAN: 68 mL/min/{1.73_m2} (ref >59)
Glucose: 98 mg/dL (ref 65–99)
POTASSIUM: 4.7 mmol/L (ref 3.5–5.2)
Sodium: 143 mmol/L (ref 134–144)

## 2016-07-31 LAB — MICROALBUMIN, URINE WAIVED
Creatinine, Urine Waived: 300 mg/dL (ref 10–300)
Microalb, Ur Waived: 10 mg/L (ref 0–19)

## 2016-07-31 LAB — BAYER DCA HB A1C WAIVED: HB A1C: 6.1 % (ref <7.0)

## 2016-07-31 LAB — TSH: TSH: 1.08 u[IU]/mL (ref 0.450–4.500)

## 2016-07-31 NOTE — Telephone Encounter (Signed)
Received referral for low dose lung cancer screening CT scan. Voicemail left at phone number listed in EMR for patient to call me back to facilitate scheduling scan.  

## 2016-07-31 NOTE — Telephone Encounter (Signed)
Received referral for initial lung cancer screening scan. Contacted patient and obtained smoking history,(current, 30 pack year) as well as answering questions related to screening process. Patient denies signs of lung cancer such as weight loss or hemoptysis. Patient denies comorbidity that would prevent curative treatment if lung cancer were found. Patient is tentatively scheduled for shared decision making visit and CT scan on 08/10/16, pending insurance approval from business office.

## 2016-08-05 ENCOUNTER — Telehealth: Payer: Self-pay | Admitting: Family Medicine

## 2016-08-05 NOTE — Telephone Encounter (Signed)
Patient called because he is having side effect to the flomax and is hoping there is another medication Dr Laural Benes could put him on to take the place of it.  Side effects are nose bleeds, dryness, sinus issues.  Thanks  Xyan [phone]

## 2016-08-05 NOTE — Telephone Encounter (Signed)
Please advise 

## 2016-08-06 MED ORDER — DUTASTERIDE 0.5 MG PO CAPS: 0.5000 mg | ORAL_CAPSULE | Freq: Every day | ORAL | 3 refills | Status: DC

## 2016-08-06 NOTE — Telephone Encounter (Signed)
Informed patient to stop flomax and start new med Dr. Laural Benes sent to pharmacy.

## 2016-08-06 NOTE — Telephone Encounter (Signed)
Left message for patient to call me back regarding the change in flomax per Dr Laural Benes.

## 2016-08-06 NOTE — Telephone Encounter (Signed)
I've sent a new medicine to his pharmacy. He can stop the flomax and try this one. Thanks!

## 2016-08-10 ENCOUNTER — Ambulatory Visit: Payer: 59

## 2016-08-10 ENCOUNTER — Inpatient Hospital Stay: Payer: 59 | Attending: Oncology | Admitting: Oncology

## 2016-08-17 ENCOUNTER — Inpatient Hospital Stay (HOSPITAL_BASED_OUTPATIENT_CLINIC_OR_DEPARTMENT_OTHER): Payer: 59 | Admitting: Oncology

## 2016-08-17 ENCOUNTER — Ambulatory Visit
Admission: RE | Admit: 2016-08-17 | Discharge: 2016-08-17 | Disposition: A | Discharge status: Home / Self Care | Payer: Commercial Managed Care - HMO | Source: Ambulatory Visit | Attending: Oncology | Admitting: Oncology

## 2016-08-17 DIAGNOSIS — F17200 Nicotine dependence, unspecified, uncomplicated: Secondary | ICD-10-CM | POA: Insufficient documentation

## 2016-08-17 DIAGNOSIS — I700 Atherosclerosis of aorta: Secondary | ICD-10-CM | POA: Diagnosis not present

## 2016-08-17 DIAGNOSIS — Z122 Encounter for screening for malignant neoplasm of respiratory organs: Secondary | ICD-10-CM

## 2016-08-17 DIAGNOSIS — Z87891 Personal history of nicotine dependence: Secondary | ICD-10-CM

## 2016-08-17 DIAGNOSIS — I2510 Atherosclerotic heart disease of native coronary artery without angina pectoris: Secondary | ICD-10-CM | POA: Diagnosis not present

## 2016-08-17 DIAGNOSIS — F17210 Nicotine dependence, cigarettes, uncomplicated: Secondary | ICD-10-CM

## 2016-08-17 NOTE — Assessment & Plan Note (Signed)
In accordance with CMS guidelines, patient has met eligibility criteria including age, absence of signs or symptoms of lung cancer.  Social History  Substance Use Topics  . Smoking status: Current Every Day Smoker    Packs/day: 1.00    Years: 30.00    Types: Cigarettes  . Smokeless tobacco: Never Used  . Alcohol use No     A shared decision-making session was conducted prior to the performance of CT scan. This includes one or more decision aids, includes benefits and harms of screening, follow-up diagnostic testing, over-diagnosis, false positive rate, and total radiation exposure.  Counseling on the importance of adherence to annual lung cancer LDCT screening, impact of co-morbidities, and ability or willingness to undergo diagnosis and treatment is imperative for compliance of the program.  Counseling on the importance of continued smoking cessation for former smokers; the importance of smoking cessation for current smokers, and information about tobacco cessation interventions have been given to patient including Agency and [facility] quit [place] programs.  Written order for lung cancer screening with LDCT has been given to the patient and any and all questions have been answered to the best of my abilities.   Yearly follow up will be coordinated by Burgess Estelle, Thoracic Navigator.

## 2016-08-17 NOTE — Progress Notes (Signed)
Personal history of tobacco use, presenting hazards to health In accordance with CMS guidelines, patient has met eligibility criteria including age, absence of signs or symptoms of lung cancer.  Social History  Substance Use Topics  . Smoking status: Current Every Day Smoker    Packs/day: 1.00    Years: 30.00    Types: Cigarettes  . Smokeless tobacco: Never Used  . Alcohol use No     A shared decision-making session was conducted prior to the performance of CT scan. This includes one or more decision aids, includes benefits and harms of screening, follow-up diagnostic testing, over-diagnosis, false positive rate, and total radiation exposure.  Counseling on the importance of adherence to annual lung cancer LDCT screening, impact of co-morbidities, and ability or willingness to undergo diagnosis and treatment is imperative for compliance of the program.  Counseling on the importance of continued smoking cessation for former smokers; the importance of smoking cessation for current smokers, and information about tobacco cessation interventions have been given to patient including [place] and 1800 quit [place] programs.  Written order for lung cancer screening with LDCT has been given to the patient and any and all questions have been answered to the best of my abilities.   Yearly follow up will be coordinated by Burgess Estelle, Thoracic Navigator.   Lucendia Herrlich, NP  08/17/16 2:56 PM

## 2016-08-19 ENCOUNTER — Encounter: Payer: Self-pay | Admitting: *Deleted

## 2016-09-23 IMAGING — CR DG CHEST 1V PORT
1 series · 1 of 1 positions shown · non-contrast
Comparison: May 12, 2014

CLINICAL DATA: Chest pain and dizziness

EXAM:
PORTABLE CHEST - 1 VIEW

[portable]
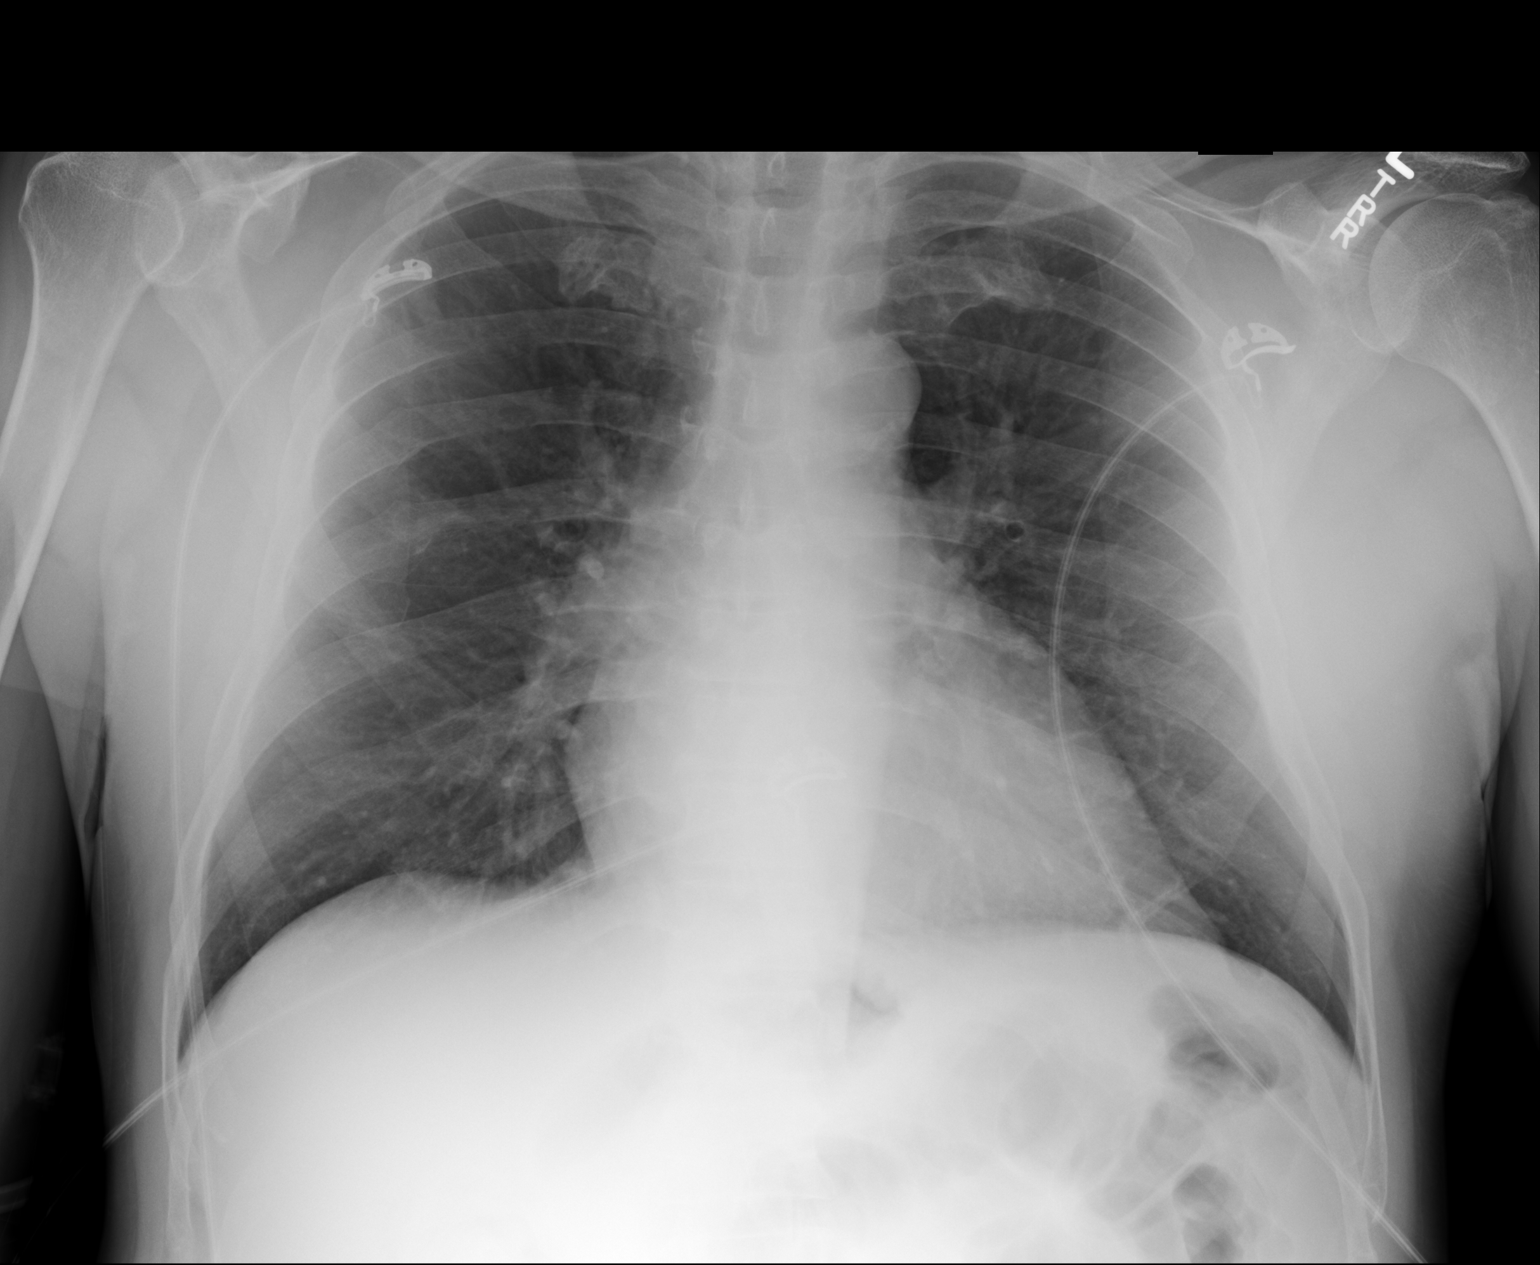

[1 of 1 positions shown; findings below may reference images not displayed]

FINDINGS: Lungs are clear. Heart size is upper normal with pulmonary
vascularity within normal limits. No adenopathy. No pneumothorax. No
bone lesions.
IMPRESSION: No edema or consolidation.

## 2016-10-20 ENCOUNTER — Telehealth: Payer: Self-pay | Admitting: Cardiovascular Disease

## 2016-10-20 NOTE — Telephone Encounter (Signed)
S/w pt of need for repeat lipid and liver profile at the [facility] outpatient lab as cholesterol medication was changed in February. He understands these are fasting labs; nothing to eat or drink after midnight the evening before lab. Pt states he will have these drawn this week.

## 2017-01-14 ENCOUNTER — Emergency Department: Payer: 59

## 2017-01-14 ENCOUNTER — Observation Stay
Admission: EM | Admit: 2017-01-14 | Discharge: 2017-01-15 | Disposition: A | Discharge status: Home / Self Care | Payer: 59 | Attending: Internal Medicine | Admitting: Internal Medicine

## 2017-01-14 ENCOUNTER — Encounter: Payer: Self-pay | Admitting: *Deleted

## 2017-01-14 DIAGNOSIS — I10 Essential (primary) hypertension: Secondary | ICD-10-CM | POA: Diagnosis present

## 2017-01-14 DIAGNOSIS — Z720 Tobacco use: Secondary | ICD-10-CM | POA: Diagnosis present

## 2017-01-14 DIAGNOSIS — Z7901 Long term (current) use of anticoagulants: Secondary | ICD-10-CM | POA: Insufficient documentation

## 2017-01-14 DIAGNOSIS — I252 Old myocardial infarction: Secondary | ICD-10-CM | POA: Insufficient documentation

## 2017-01-14 DIAGNOSIS — Z9114 Patient's other noncompliance with medication regimen: Secondary | ICD-10-CM | POA: Diagnosis not present

## 2017-01-14 DIAGNOSIS — I119 Hypertensive heart disease without heart failure: Secondary | ICD-10-CM | POA: Insufficient documentation

## 2017-01-14 DIAGNOSIS — Z833 Family history of diabetes mellitus: Secondary | ICD-10-CM | POA: Insufficient documentation

## 2017-01-14 DIAGNOSIS — Z79899 Other long term (current) drug therapy: Secondary | ICD-10-CM | POA: Diagnosis not present

## 2017-01-14 DIAGNOSIS — Z7982 Long term (current) use of aspirin: Secondary | ICD-10-CM | POA: Insufficient documentation

## 2017-01-14 DIAGNOSIS — E785 Hyperlipidemia, unspecified: Secondary | ICD-10-CM | POA: Diagnosis present

## 2017-01-14 DIAGNOSIS — F17210 Nicotine dependence, cigarettes, uncomplicated: Secondary | ICD-10-CM | POA: Insufficient documentation

## 2017-01-14 DIAGNOSIS — Z8249 Family history of ischemic heart disease and other diseases of the circulatory system: Secondary | ICD-10-CM | POA: Insufficient documentation

## 2017-01-14 DIAGNOSIS — R002 Palpitations: Secondary | ICD-10-CM | POA: Insufficient documentation

## 2017-01-14 DIAGNOSIS — R079 Chest pain, unspecified: Secondary | ICD-10-CM

## 2017-01-14 DIAGNOSIS — I2510 Atherosclerotic heart disease of native coronary artery without angina pectoris: Secondary | ICD-10-CM | POA: Diagnosis present

## 2017-01-14 DIAGNOSIS — I4891 Unspecified atrial fibrillation: Secondary | ICD-10-CM | POA: Diagnosis present

## 2017-01-14 DIAGNOSIS — I480 Paroxysmal atrial fibrillation: Principal | ICD-10-CM | POA: Diagnosis present

## 2017-01-14 LAB — BASIC METABOLIC PANEL
Anion gap: 7 (ref 5–15)
BUN: 17 mg/dL (ref 6–20)
CALCIUM: 9.4 mg/dL (ref 8.9–10.3)
CHLORIDE: 111 mmol/L (ref 101–111)
CO2: 21 mmol/L — AB (ref 22–32)
CREATININE: 1.21 mg/dL (ref 0.61–1.24)
GFR calc non Af Amer: >60 mL/min (ref >60)
Glucose, Bld: 143 mg/dL — ABNORMAL HIGH (ref 65–99)
Potassium: 3.6 mmol/L (ref 3.5–5.1)
Sodium: 139 mmol/L (ref 135–145)

## 2017-01-14 LAB — APTT: aPTT: >160 seconds (ref 24–36)

## 2017-01-14 LAB — CBC
HCT: 42.9 % (ref 40.0–52.0)
Hemoglobin: 14.4 g/dL (ref 13.0–18.0)
MCH: 30.2 pg (ref 26.0–34.0)
MCHC: 33.5 g/dL (ref 32.0–36.0)
MCV: 90.3 fL (ref 80.0–100.0)
PLATELETS: 220 10*3/uL (ref 150–440)
RBC: 4.75 MIL/uL (ref 4.40–5.90)
RDW: 15.2 % — AB (ref 11.5–14.5)
WBC: 7.5 10*3/uL (ref 3.8–10.6)

## 2017-01-14 LAB — TROPONIN I

## 2017-01-14 LAB — PROTIME-INR
INR: 0.94
Prothrombin Time: 12.6 seconds (ref 11.4–15.2)

## 2017-01-14 MED ORDER — HEPARIN (PORCINE) IN NACL 100-0.45 UNIT/ML-% IJ SOLN
1100.0000 [IU]/h | INTRAMUSCULAR | Status: DC
  Administered 2017-01-14: 1100 [IU]/h via INTRAVENOUS
  Filled 2017-01-14: qty 250

## 2017-01-14 MED ORDER — ASPIRIN EC 81 MG PO TBEC
81.0000 mg | DELAYED_RELEASE_TABLET | Freq: Every day | ORAL | Status: DC
  Administered 2017-01-14 – 2017-01-15 (×2): 81 mg via ORAL
  Filled 2017-01-14 (×2): qty 1

## 2017-01-14 MED ORDER — METOPROLOL TARTRATE 5 MG/5ML IV SOLN: 2.5000 mg | INTRAVENOUS | Status: DC | PRN

## 2017-01-14 MED ORDER — LORAZEPAM 2 MG/ML IJ SOLN
0.5000 mg | Freq: Once | INTRAMUSCULAR | Status: AC
  Administered 2017-01-14: 0.5 mg via INTRAVENOUS
  Filled 2017-01-14: qty 1

## 2017-01-14 MED ORDER — FLECAINIDE ACETATE 100 MG PO TABS
100.0000 mg | ORAL_TABLET | Freq: Once | ORAL | Status: AC
  Administered 2017-01-14: 100 mg via ORAL
  Filled 2017-01-14: qty 1

## 2017-01-14 MED ORDER — ONDANSETRON HCL 4 MG/2ML IJ SOLN: 4.0000 mg | Freq: Four times a day (QID) | INTRAMUSCULAR | Status: DC | PRN

## 2017-01-14 MED ORDER — FLECAINIDE ACETATE 100 MG PO TABS
100.0000 mg | ORAL_TABLET | Freq: Two times a day (BID) | ORAL | Status: DC
  Administered 2017-01-15: 100 mg via ORAL
  Filled 2017-01-14 (×2): qty 1

## 2017-01-14 MED ORDER — NITROGLYCERIN 2 % TD OINT
0.5000 [in_us] | TOPICAL_OINTMENT | Freq: Four times a day (QID) | TRANSDERMAL | Status: DC
  Administered 2017-01-14: 0.5 [in_us] via TOPICAL
  Filled 2017-01-14: qty 1

## 2017-01-14 MED ORDER — ROSUVASTATIN CALCIUM 10 MG PO TABS
40.0000 mg | ORAL_TABLET | Freq: Every day | ORAL | Status: DC
  Administered 2017-01-15: 40 mg via ORAL
  Filled 2017-01-14: qty 4

## 2017-01-14 MED ORDER — DOCUSATE SODIUM 100 MG PO CAPS
100.0000 mg | ORAL_CAPSULE | Freq: Two times a day (BID) | ORAL | Status: DC
  Administered 2017-01-14: 100 mg via ORAL
  Filled 2017-01-14: qty 1

## 2017-01-14 MED ORDER — HEPARIN BOLUS VIA INFUSION
4000.0000 [IU] | Freq: Once | INTRAVENOUS | Status: AC
  Administered 2017-01-14: 4000 [IU] via INTRAVENOUS
  Filled 2017-01-14: qty 4000

## 2017-01-14 MED ORDER — METOPROLOL TARTRATE 50 MG PO TABS
100.0000 mg | ORAL_TABLET | Freq: Two times a day (BID) | ORAL | Status: DC
  Filled 2017-01-14: qty 2

## 2017-01-14 MED ORDER — METOPROLOL TARTRATE 5 MG/5ML IV SOLN
5.0000 mg | Freq: Once | INTRAVENOUS | Status: AC
  Administered 2017-01-14: 5 mg via INTRAVENOUS
  Filled 2017-01-14: qty 5

## 2017-01-14 MED ORDER — SODIUM CHLORIDE 0.9 % IV SOLN
INTRAVENOUS | Status: DC
  Administered 2017-01-14: 21:00:00 via INTRAVENOUS

## 2017-01-14 MED ORDER — ACETAMINOPHEN 325 MG PO TABS: 650.0000 mg | ORAL_TABLET | Freq: Four times a day (QID) | ORAL | Status: DC | PRN

## 2017-01-14 MED ORDER — ONDANSETRON HCL 4 MG PO TABS: 4.0000 mg | ORAL_TABLET | Freq: Four times a day (QID) | ORAL | Status: DC | PRN

## 2017-01-14 MED ORDER — NITROGLYCERIN 2 % TD OINT
TOPICAL_OINTMENT | TRANSDERMAL | Status: AC
  Administered 2017-01-14: 0.5 [in_us] via TOPICAL
  Filled 2017-01-14: qty 1

## 2017-01-14 MED ORDER — ACETAMINOPHEN 650 MG RE SUPP: 650.0000 mg | Freq: Four times a day (QID) | RECTAL | Status: DC | PRN

## 2017-01-14 MED ORDER — BISACODYL 5 MG PO TBEC: 5.0000 mg | DELAYED_RELEASE_TABLET | Freq: Every day | ORAL | Status: DC | PRN

## 2017-01-14 MED ORDER — DOCUSATE SODIUM 100 MG PO CAPS: 100.0000 mg | ORAL_CAPSULE | Freq: Every day | ORAL | Status: DC | PRN

## 2017-01-14 NOTE — ED Notes (Signed)
Meal given

## 2017-01-14 NOTE — Progress Notes (Signed)
ANTICOAGULATION CONSULT NOTE  Pharmacy Consult for Heparin Indication: atrial fibrillation  No Known Allergies  Patient Measurements: Height: 5\' 6"  (167.6 cm) Weight: 170 lb (77.1 kg) IBW/kg (Calculated) : 63.8 Heparin Dosing Weight: 77.1 kg  Vital Signs: Temp: 97.8 F (36.6 C) (08/16 1739) Temp Source: Oral (08/16 1739) BP: 129/92 (08/16 2000) Pulse Rate: 35 (08/16 2000)  Labs:  Recent Labs  01/14/17 1735  HGB 14.4  HCT 42.9  PLT 220  LABPROT 12.6  INR 0.94  CREATININE 1.21  TROPONINI <0.03    Estimated Creatinine Clearance: 62.7 mL/min (by C-G formula based on SCr of 1.21 mg/dL).   Medical History: Past Medical History:  Diagnosis Date  . Atrial fibrillation with rapid ventricular response (HCC) 01/27/2014  . Claudication (HCC)   . Erectile dysfunction   . Essential hypertension   . History of echocardiogram    a. 03/29/2014: EF 55-60%, mild LVH, normal RVSP;  b. 05/2015 Echo: EF 60-65%, no rwma, triv AI, mild MR, mildly dil LA.  Marland Kitchen Hyperlipidemia   . Non-obstructive CAD    a. 01/27/2014 Cath: LM nl, LAD mild diff dz w/o obs, LCx no sig obs - scattered 20-30% mLCx, RCA no obs dz, EF 70%  . Paroxysmal atrial fibrillation (HCC)    a. s/p ? ablation procedure ~ 20 yrs ago ([facility]); b. very symptomatic when goes into a-fib w/ RVR;  c. CHA2DS2VASc= 1-->ASA.  Marland Kitchen STEMI (ST elevation myocardial infarction) (HCC) 01/29/2014  . Tobacco abuse    a. ongoing - 1 ppd.    Assessment: 61 y/o M with a h/o atrial fibrillation admitted with atrial fibrillation rapid ventricular response.   Goal of Therapy:  Heparin level 0.3-0.7 units/ml Monitor platelets by anticoagulation protocol: Yes   Plan:  Give 4000 units bolus x 1 Start heparin infusion at 1100 units/hr Check anti-Xa level in 6 hours and daily while on heparin Continue to monitor H&H and platelets  Luisa Hart D 01/14/2017,8:30 PM

## 2017-01-14 NOTE — ED Triage Notes (Signed)
Pt arrives via EMS from home, EMS reports HR 180s AFIB RVR upon arrival, sttaes they gave 81 mg ASA, 1 spray of nitro and 5 mg IV metoprolol, arrives awake and alert, states missing some of his medications recently, EDP at bedside upon arrival

## 2017-01-14 NOTE — H&P (Signed)
[facility] [place] [facility]   PATIENT NAME: Darren Rogers    MR#:  [id number]  DATE OF BIRTH:  June 11, 1955  DATE OF ADMISSION:  01/14/2017  PRIMARY CARE PHYSICIAN: Dorcas Carrow, DO   REQUESTING/REFERRING PHYSICIAN: Daryel November  CHIEF COMPLAINT: Shortness of breath and palpitations    Chief Complaint  Patient presents with  . Tachycardia    HISTORY OF PRESENT ILLNESS:  Darren Rogers  is a 61 y.o. male with a known history ofSimilar atrial fibrillation, essential hypertension came in because of palpitations started this evening. Patient noted to have pressure in the left side of the chest associated with nausea, dizziness followed by palpitations. When he came to emergency room. Patient found to have rapid A. fib with heart rate 1 80 bpm when he arrived by EMS. Patient takes aspirin, metoprolol, peptide at home. Patient was given nitroglycerin glycerin, IV metoprolol by EMS. first set of troponins are negative.  PAST MEDICAL HISTORY:   Past Medical History:  Diagnosis Date  . Atrial fibrillation with rapid ventricular response (HCC) 01/27/2014  . Claudication (HCC)   . Erectile dysfunction   . Essential hypertension   . History of echocardiogram    a. 03/29/2014: EF 55-60%, mild LVH, normal RVSP;  b. 05/2015 Echo: EF 60-65%, no rwma, triv AI, mild MR, mildly dil LA.  Marland Kitchen Hyperlipidemia   . Non-obstructive CAD    a. 01/27/2014 Cath: LM nl, LAD mild diff dz w/o obs, LCx no sig obs - scattered 20-30% mLCx, RCA no obs dz, EF 70%  . Paroxysmal atrial fibrillation (HCC)    a. s/p ? ablation procedure ~ 20 yrs ago ([facility]); b. very symptomatic when goes into a-fib w/ RVR;  c. CHA2DS2VASc= 1-->ASA.  Marland Kitchen STEMI (ST elevation myocardial infarction) (HCC) 01/29/2014  . Tobacco abuse    a. ongoing - 1 ppd.    PAST SURGICAL HISTOIRY:   Past Surgical History:  Procedure Laterality Date  . CARDIAC CATHETERIZATION    . [facility]IZ[facility]     [facility]  .  LEFT HEART CATH Right 01/27/2014   Procedure: LEFT HEART CATH;  Surgeon: Micheline Chapman, MD;  Location: [facility] CATH LAB;  Service: Cardiovascular;  Laterality: Right;    SOCIAL HISTORY:   Social History  Substance Use Topics  . Smoking status: Current Every Day Smoker    Packs/day: 1.00    Years: 30.00    Types: Cigarettes  . Smokeless tobacco: Never Used  . Alcohol use No    FAMILY HISTORY:   Family History  Problem Relation Age of Onset  . Coronary artery disease Father 63  . Diabetes Mother   . Diabetes Sister   . Diabetes Brother   . Diabetes Maternal Grandmother   . Diabetes Sister   . Diabetes Sister     DRUG ALLERGIES:  No Known Allergies  REVIEW OF SYSTEMS:  CONSTITUTIONAL: No fever, fatigue or weakness.  EYES: No blurred or double vision.  EARS, NOSE, AND THROAT: No tinnitus or ear pain.  RESPIRATORY: No cough, shortness of breath, wheezing or hemoptysis.  CARDIOVASCULAR: Chest pain, nausea, dizziness since this evening.  GASTROINTESTINAL: No nausea, vomiting, diarrhea or abdominal pain.  GENITOURINARY: No dysuria, hematuria.  ENDOCRINE: No polyuria, nocturia,  HEMATOLOGY: No anemia, easy bruising or bleeding SKIN: No rash or lesion. MUSCULOSKELETAL: No joint pain or arthritis.   NEUROLOGIC: No tingling, numbness, weakness.  PSYCHIATRY: No anxiety or depression.   MEDICATIONS AT HOME:   Prior to Admission  medications   Medication Sig Start Date End Date Taking? Authorizing Provider  aspirin EC 81 MG tablet Take 1 tablet (81 mg total) by mouth daily. 07/23/16  Yes Iran Ouch, MD  docusate sodium (COLACE) 100 MG capsule Take 100 mg by mouth daily as needed for mild constipation.   Yes [provider]  flecainide (TAMBOCOR) 100 MG tablet Take 1 tablet (100 mg total) by mouth every 12 (twelve) hours. 12/10/15  Yes Iran Ouch, MD  metoprolol (LOPRESSOR) 100 MG tablet Take 1 tablet (100 mg total) by mouth 2 (two) times daily. 03/31/16  Yes  Iran Ouch, MD  sildenafil (VIAGRA) 25 MG tablet Take 1 tablet (25 mg total) by mouth daily as needed for erectile dysfunction. 01/22/15  Yes Iran Ouch, MD  buPROPion (WELLBUTRIN SR) 150 MG 12 hr tablet 1 tab qAM for 3 days, then 1 tab BID after that. Pick a day in the 2nd week Patient not taking: Reported on 01/14/2017 07/30/16   Olevia Perches P, DO  dutasteride (AVODART) 0.5 MG capsule Take 1 capsule (0.5 mg total) by mouth daily. Patient not taking: Reported on 01/14/2017 08/06/16   Olevia Perches P, DO  rosuvastatin (CRESTOR) 40 MG tablet Take 1 tablet (40 mg total) by mouth daily. 07/29/16 10/27/16  Iran Ouch, MD      VITAL SIGNS:  Blood pressure (!) 129/92, pulse (!) 35, temperature 97.8 F (36.6 C), temperature source Oral, resp. rate 18, height 5\' 6"  (1.676 m), weight 77.1 kg (170 lb), SpO2 98 %.  PHYSICAL EXAMINATION:  GENERAL:  61 y.o.-year-old patient lying in the bed with no acute distress.  EYES: Pupils equal, round, reactive to light.No scleral icterus. Extraocular muscles intact.  HEENT: Head atraumatic, normocephalic. Oropharynx and nasopharynx clear.  NECK:  Supple, no jugular venous distention. No thyroid enlargement, no tenderness.  LUNGS: Normal breath sounds bilaterally, no wheezing, rales,rhonchi or crepitation. No use of accessory muscles of respiration.  CARDIOVASCULAR: S1, S2 normal. No murmurs, rubs, or gallops.  ABDOMEN: Soft, nontender, nondistended. Bowel sounds present. No organomegaly or mass.  EXTREMITIES: No pedal edema, cyanosis, or clubbing.  NEUROLOGIC: Cranial nerves II through XII are intact. Muscle strength 5/5 in all extremities. Sensation intact. Gait not checked.  PSYCHIATRIC: The patient is alert and oriented x 3.  SKIN: No obvious rash, lesion, or ulcer.   LABORATORY PANEL:   CBC  Recent Labs Lab 01/14/17 1735  WBC 7.5  HGB 14.4  HCT 42.9  PLT 220    ------------------------------------------------------------------------------------------------------------------  Chemistries   Recent Labs Lab 01/14/17 1735  NA 139  K 3.6  CL 111  CO2 21*  GLUCOSE 143*  BUN 17  CREATININE 1.21  CALCIUM 9.4   ------------------------------------------------------------------------------------------------------------------  Cardiac Enzymes  Recent Labs Lab 01/14/17 1735  TROPONINI <0.03   ------------------------------------------------------------------------------------------------------------------  RADIOLOGY:  Dg Chest Port 1 View  Result Date: 01/14/2017 CLINICAL DATA:  Chest pain today, atrial fibrillation EXAM: PORTABLE CHEST 1 VIEW COMPARISON:  Portable exam 1757 hours compared to 08/23/2015 FINDINGS: Normal heart size, mediastinal contours and pulmonary vascularity. Atherosclerotic calcification aorta. Lungs clear. No pulmonary infiltrate, pleural effusion or pneumothorax. Bones demineralized. IMPRESSION: No acute abnormalities. Electronically Signed   By: Ulyses Southward M.D.   On: 01/14/2017 18:16    EKG:   Orders placed or performed during the hospital encounter of 01/14/17  . ED EKG  . ED EKG  . EKG 12-Lead  . EKG 12-Lead   EKG shows atrial fibrillation with 1 33  bpm, T-wave inversions in V4, V5, V6. IMPRESSION AND PLAN:   #1. A. fib with RVR: Rate improved at this time, at time of my visit heart rate was 122 and he was in A. fib. Patient received metoprolol 5 mg IV one time, recommended 100 mg, were given in the emergency room. Spoke with Dr. Jens Som , on call for [facility] cardiology he recommended continuing his home dose flecainide, metoprolol and use when necessary either Cardizem or metoprolol for heart rate control. And continue heparin drip and consult cardiology, 2. chest pain, nausea and dizziness: First set of troponins negative, continue nitroglycerin for chest pain as needed, cycle the troponins, continue  heparin drip, continue beta blockers, statins.  #3. essential hypertension: Continue metoprolol. #4. history of BPH  All the records are reviewed and case discussed with ED provider. Management plans discussed with the patient, family and they are in agreement.  CODE STATUS: full  TOTAL TIME TAKING CARE OF THIS PATIENT: .    Katha Hamming M.D on 01/14/2017 at 8:39 PM  Between 7am to 6pm - Pager - [phone]  After 6pm go to [email or web address] - password [email or web address] [facility]  [facility]  Office  [phone]  CC: Primary care physician; Dorcas Carrow, DO  Note: This dictation was prepared with Dragon dictation along with smaller phrase technology. Any transcriptional errors that result from this process are unintentional.

## 2017-01-14 NOTE — ED Provider Notes (Signed)
[facility] Emergency Department Provider Note       Time seen: ----------------------------------------- 5:37 PM on 01/14/2017 -----------------------------------------     I have reviewed the triage vital signs and the nursing notes.   HISTORY   Chief Complaint Tachycardia    HPI Darren Rogers is a 61 y.o. male who presents to the ED for rapid heartbeat. He arrives by EMS from home with a heart rate in the 180s. Reportedly he has a history of atrial fibrillation that was beating rapidly at home. He was given aspirin, nitroglycerin and IV metoprolol with improvement in his symptoms. Patient states he had been missing some of his medications recently but took them prior to EMS arrival.   Past Medical History:  Diagnosis Date  . Atrial fibrillation with rapid ventricular response (HCC) 01/27/2014  . Claudication (HCC)   . Erectile dysfunction   . Essential hypertension   . History of echocardiogram    a. 03/29/2014: EF 55-60%, mild LVH, normal RVSP;  b. 05/2015 Echo: EF 60-65%, no rwma, triv AI, mild MR, mildly dil LA.  Marland Kitchen Hyperlipidemia   . Non-obstructive CAD    a. 01/27/2014 Cath: LM nl, LAD mild diff dz w/o obs, LCx no sig obs - scattered 20-30% mLCx, RCA no obs dz, EF 70%  . Paroxysmal atrial fibrillation (HCC)    a. s/p ? ablation procedure ~ 20 yrs ago ([facility]); b. very symptomatic when goes into a-fib w/ RVR;  c. CHA2DS2VASc= 1-->ASA.  Marland Kitchen STEMI (ST elevation myocardial infarction) (HCC) 01/29/2014  . Tobacco abuse    a. ongoing - 1 ppd.    Patient Active Problem List   Diagnosis Date Noted  . Personal history of tobacco use, presenting hazards to health 08/17/2016  . BPH associated with nocturia 07/30/2016  . IFG (impaired fasting glucose) 07/30/2016  . Essential hypertension   . Claudication (HCC)   . 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 CAD in native artery 04/11/2014  . Paroxysmal atrial fibrillation (HCC)   . Tobacco abuse    . Hyperlipidemia     Past Surgical History:  Procedure Laterality Date  . CARDIAC CATHETERIZATION    . CARD[facility] CATHETERIZATION     [facility]  . LEFT HEART CATH Right 01/27/2014   Procedure: LEFT HEART CATH;  Surgeon: Micheline Chapman, MD;  Location: [facility] CATH LAB;  Service: Cardiovascular;  Laterality: Right;    Allergies Patient has no known allergies.  Social History Social History  Substance Use Topics  . Smoking status: Current Every Day Smoker    Packs/day: 1.00    Years: 30.00    Types: Cigarettes  . Smokeless tobacco: Never Used  . Alcohol use No    Review of Systems Constitutional: Negative for fever. Eyes: Negative for vision changes ENT:  Negative for congestion, sore throat Cardiovascular: Positive for fast heartbeat Respiratory: Negative for shortness of breath. Gastrointestinal: Negative for abdominal pain, vomiting and diarrhea. Genitourinary: Negative for dysuria. Musculoskeletal: Negative for back pain. Skin: Negative for rash. Neurological: Negative for headaches, focal weakness or numbness.  All systems negative/normal/unremarkable except as stated in the HPI  ____________________________________________   PHYSICAL EXAM:  VITAL SIGNS: ED Triage Vitals [01/14/17 1737]  Enc Vitals Group     BP      Pulse      Resp      Temp      Temp src      SpO2      Weight 170 lb (77.1 kg)  Height 5\' 6"  (1.676 m)     Head Circumference      Peak Flow      Pain Score 4     Pain Loc      Pain Edu?      Excl. in GC?     Constitutional: Alert and oriented. Well appearing and in no distress. Eyes: Conjunctivae are normal. Normal extraocular movements. ENT   Head: Normocephalic and atraumatic.   Nose: No congestion/rhinnorhea.   Mouth/Throat: Mucous membranes are moist.   Neck: No stridor. Cardiovascular: Rapid rate, irregular rhythm Respiratory: Normal respiratory effort without tachypnea nor retractions. Breath sounds are clear and equal  bilaterally. No wheezes/rales/rhonchi. Gastrointestinal: Soft and nontender. Normal bowel sounds Musculoskeletal: Nontender with normal range of motion in extremities. No lower extremity tenderness nor edema. Neurologic:  Normal speech and language. No gross focal neurologic deficits are appreciated.  Skin:  Skin is warm, dry and intact. No rash noted. Psychiatric: Mood and affect are normal. Speech and behavior are normal.  ____________________________________________  EKG: Interpreted by me. Age fibrillation with a rate of 122 bpm, PVC, right axis deviation, LVH  ____________________________________________  ED COURSE:  Pertinent labs & imaging results that were available during my care of the patient were reviewed by me and considered in my medical decision making (see chart for details). Patient presents for A. fib with RVR, we will assess with labs and imaging as indicated.   Procedures ____________________________________________   LABS (pertinent positives/negatives)  Labs Reviewed  BASIC METABOLIC PANEL - Abnormal; Notable for the following:       Result Value   CO2 21 (*)    Glucose, Bld 143 (*)    All other components within normal limits  CBC - Abnormal; Notable for the following:    RDW 15.2 (*)    All other components within normal limits  TROPONIN I  PROTIME-INR    RADIOLOGY  Chest x-ray IMPRESSION: No acute abnormalities. ____________________________________________  FINAL ASSESSMENT AND PLAN  Atrial fibrillation with a rapid ventricular response, chest pain  Plan: Patient's labs and imaging were dictated above. Patient had presented for Rapid atrial fibrillation and was given the next dose of IV metoprolol as well as oral flecainide Without significant improvement in his heart rate. He still has rapid atrial fibrillation and is still having chest pain. Some of his symptoms may be anxiety related so he was given Ativan. I did discuss with cardiology who  recommended intermittent treatment with Lopressor but no current drips. I will discuss with the hospitalist for admission.   Emily Filbert, MD   Note: This note was generated in part or whole with voice recognition software. Voice recognition is usually quite accurate but there are transcription errors that can and very often do occur. I apologize for any typographical errors that were not detected and corrected.     Emily Filbert, MD 01/14/17 2014

## 2017-01-15 ENCOUNTER — Telehealth: Payer: Self-pay | Admitting: Cardiovascular Disease

## 2017-01-15 ENCOUNTER — Other Ambulatory Visit: Payer: Self-pay | Admitting: *Deleted

## 2017-01-15 DIAGNOSIS — I10 Essential (primary) hypertension: Secondary | ICD-10-CM | POA: Diagnosis not present

## 2017-01-15 DIAGNOSIS — I2510 Atherosclerotic heart disease of native coronary artery without angina pectoris: Secondary | ICD-10-CM

## 2017-01-15 DIAGNOSIS — E782 Mixed hyperlipidemia: Secondary | ICD-10-CM | POA: Diagnosis not present

## 2017-01-15 DIAGNOSIS — I4891 Unspecified atrial fibrillation: Secondary | ICD-10-CM | POA: Diagnosis not present

## 2017-01-15 DIAGNOSIS — Z720 Tobacco use: Secondary | ICD-10-CM | POA: Diagnosis not present

## 2017-01-15 DIAGNOSIS — R079 Chest pain, unspecified: Secondary | ICD-10-CM

## 2017-01-15 LAB — HEPARIN LEVEL (UNFRACTIONATED): HEPARIN UNFRACTIONATED: 1.17 [IU]/mL — AB (ref 0.30–0.70)

## 2017-01-15 LAB — BASIC METABOLIC PANEL
ANION GAP: 5 (ref 5–15)
BUN: 20 mg/dL (ref 6–20)
CALCIUM: 8.6 mg/dL — AB (ref 8.9–10.3)
CO2: 23 mmol/L (ref 22–32)
CREATININE: 1.03 mg/dL (ref 0.61–1.24)
Chloride: 112 mmol/L — ABNORMAL HIGH (ref 101–111)
GFR calc Af Amer: >60 mL/min (ref >60)
GLUCOSE: 107 mg/dL — AB (ref 65–99)
Potassium: 4.2 mmol/L (ref 3.5–5.1)
Sodium: 140 mmol/L (ref 135–145)

## 2017-01-15 LAB — CBC
HCT: 39.5 % — ABNORMAL LOW (ref 40.0–52.0)
HEMOGLOBIN: 13.4 g/dL (ref 13.0–18.0)
MCH: 30.9 pg (ref 26.0–34.0)
MCHC: 34.1 g/dL (ref 32.0–36.0)
MCV: 90.7 fL (ref 80.0–100.0)
PLATELETS: 205 10*3/uL (ref 150–440)
RBC: 4.35 MIL/uL — ABNORMAL LOW (ref 4.40–5.90)
RDW: 15.3 % — AB (ref 11.5–14.5)
WBC: 7.7 10*3/uL (ref 3.8–10.6)

## 2017-01-15 LAB — TROPONIN I

## 2017-01-15 MED ORDER — RIVAROXABAN 20 MG PO TABS: 20.0000 mg | ORAL_TABLET | Freq: Every day | ORAL | Status: DC

## 2017-01-15 MED ORDER — ROSUVASTATIN CALCIUM 40 MG PO TABS: 40.0000 mg | ORAL_TABLET | Freq: Every day | ORAL | 0 refills | Status: DC

## 2017-01-15 MED ORDER — FLECAINIDE ACETATE 100 MG PO TABS: 100.0000 mg | ORAL_TABLET | Freq: Two times a day (BID) | ORAL | 0 refills | Status: DC

## 2017-01-15 MED ORDER — SODIUM CHLORIDE 0.9% FLUSH
3.0000 mL | Freq: Two times a day (BID) | INTRAVENOUS | Status: DC
  Administered 2017-01-15: 3 mL via INTRAVENOUS

## 2017-01-15 MED ORDER — METOPROLOL TARTRATE 100 MG PO TABS: 50.0000 mg | ORAL_TABLET | Freq: Two times a day (BID) | ORAL | 0 refills | Status: DC

## 2017-01-15 MED ORDER — RIVAROXABAN 20 MG PO TABS: 20.0000 mg | ORAL_TABLET | Freq: Every day | ORAL | 0 refills | Status: DC

## 2017-01-15 MED ORDER — HEPARIN (PORCINE) IN NACL 100-0.45 UNIT/ML-% IJ SOLN: 900.0000 [IU]/h | INTRAMUSCULAR | Status: DC

## 2017-01-15 MED ORDER — METOPROLOL TARTRATE 50 MG PO TABS
50.0000 mg | ORAL_TABLET | Freq: Two times a day (BID) | ORAL | Status: DC
Start: 2017-01-15 — End: 2017-01-15

## 2017-01-15 NOTE — Care Management (Signed)
Informed that patient has communicated he stopped taking his Xarelto due to high copay.  Spoke with patient and showed him to copay assist Xarelto card.  Recognizes card and when asked if he 'activated" the card" he says 'No."  Verbally confirms he has pharmacy coverage with his united health care insurance, "but I have to pay a percentage, and for this medicine it is pretty high."  Can not remember the amount of his copay.  He stopped taking it early after it ws started.  Instructed patient to activate the copay assist card prior to discharge so CM can assist if there are any issues.  Verbalized understanding

## 2017-01-15 NOTE — Progress Notes (Signed)
ANTICOAGULATION CONSULT NOTE  Pharmacy Consult for Heparin Indication: atrial fibrillation  No Known Allergies  Patient Measurements: Height: 5\' 6"  (167.6 cm) Weight: 169 lb 6.4 oz (76.8 kg) IBW/kg (Calculated) : 63.8 Heparin Dosing Weight: 77.1 kg  Vital Signs: Temp: 98 F (36.7 C) (08/16 2138) Temp Source: Oral (08/16 2138) BP: 114/74 (08/17 0517) Pulse Rate: 62 (08/17 0517)  Labs:  Recent Labs  01/14/17 1735 01/14/17 2143 01/15/17 0233  HGB 14.4  --  13.4  HCT 42.9  --  39.5*  PLT 220  --  205  APTT  --  >160*  --   LABPROT 12.6  --   --   INR 0.94  --   --   HEPARINUNFRC  --   --  1.17*  CREATININE 1.21  --  1.03  TROPONINI <0.03 <0.03 <0.03    Estimated Creatinine Clearance: 73.5 mL/min (by C-G formula based on SCr of 1.03 mg/dL).   Medical History: Past Medical History:  Diagnosis Date  . Atrial fibrillation with rapid ventricular response (HCC) 01/27/2014  . Claudication (HCC)   . Erectile dysfunction   . Essential hypertension   . History of echocardiogram    a. 03/29/2014: EF 55-60%, mild LVH, normal RVSP;  b. 05/2015 Echo: EF 60-65%, no rwma, triv AI, mild MR, mildly dil LA.  Marland Kitchen Hyperlipidemia   . Non-obstructive CAD    a. 01/27/2014 Cath: LM nl, LAD mild diff dz w/o obs, LCx no sig obs - scattered 20-30% mLCx, RCA no obs dz, EF 70%  . Paroxysmal atrial fibrillation (HCC)    a. s/p ? ablation procedure ~ 20 yrs ago ([facility]); b. very symptomatic when goes into a-fib w/ RVR;  c. CHA2DS2VASc= 1-->ASA.  Marland Kitchen STEMI (ST elevation myocardial infarction) (HCC) 01/29/2014  . Tobacco abuse    a. ongoing - 1 ppd.    Assessment: 61 y/o M with a h/o atrial fibrillation admitted with atrial fibrillation rapid ventricular response.   Goal of Therapy:  Heparin level 0.3-0.7 units/ml Monitor platelets by anticoagulation protocol: Yes   Plan:  Give 4000 units bolus x 1 Start heparin infusion at 1100 units/hr Check anti-Xa level in 6 hours and daily while on  heparin Continue to monitor H&H and platelets   8/17 @ 0233 HL 1.17 supratherapeutic. Will hold heparin drip for 1 hour and will restart rate at 900 units/hr and will recheck HL @ 1230. Hgb down by 1 unit, but stable. Will continue to monitor.  Thomasene Ripple, PharmD, BCPS Clinical Pharmacist 01/15/2017

## 2017-01-15 NOTE — Telephone Encounter (Signed)
[facility] for STEMI needs 1 week Dr. Kirke Corin Scheduled with berge 9/11 at 1020 added to wait list

## 2017-01-15 NOTE — Consult Note (Signed)
Cardiology Consultation Note  Patient ID: Darren Rogers, MRN: [id number], DOB/AGE: 62/17/1957 62 y.o. Admit date: 01/14/2017   Date of Consult: 01/15/2017 Primary Physician: Dorcas Carrow, DO Primary Cardiologist: Dr. Kirke Corin, MD Requesting Physician: Dr. Luberta Mutter, MD  Chief Complaint: Chest pain/palpitations Reason for Consult: Afib with RVR  HPI: Darren Rogers is a 61 y.o. male who is being seen today for the evaluation of Afib with RVR at the request of Dr. Luberta Mutter, MD. Patient has a h/o PAF previously on Xarelto though self discontinued, ongoing tobacco abuse, hypertensive heart disease, HLD, PAD and abnormal EKG who presented to [facility] on 8/16 with onset of chest pain and palpitations. He was noted to be in Afib with RVR with heart rates in the 180s bpm.  Prior cardiac cath in 12/2013 showed mild nonobstructive CAD. Afib had previously been well controlled on flecainide and metoprolol bid. He was previously on Xarelto, though self discontinued this 2/2 financial reasons. Most recent echo from 2016 showed EF 60-65%, normal wall motion, trivial AI, mild MR, and mildly dilated left atrium. He was most recently seen in the office in 07/2016 and doing well at that time. Patient has been under a lot of stress lately at home and at work. He continues to stress about money. Because of his finances, he began taking flecainide and Lopressor once daily ~ 1 month ago rather than bid. He was in his usual state of health until the evening of 8/16 when he got up from dinner. He developed sudden onset of palpitations and sharp chest pain. He knew he was back in Afib with RVR. He attempted to drive himself to the hospital but his pain and palpitations were too great. He pulled over on the side of the road and called 911. Upon his arrival to [facility] he was noted to be in Afib with RVR with heart rates into the 180s bpm. Pain hhad improved to a 2/10 with slowly improving heart rate. BP stable. Labs were unrevealing.  Troponin negative x 2. CXR not acute. CKG showed Afib as below. He was continued on flecainide and metoprolol with prn rate control. He was started on a heparin gtt. At 22:02 he converted to sinus rhythm and has remained in sinus rhythm since with a heart rate in the 60s bpm currently. He is symptom-free and wants to go home.    Past Medical History:  Diagnosis Date  . Atrial fibrillation with rapid ventricular response (HCC) 01/27/2014  . Claudication (HCC)   . Erectile dysfunction   . Essential hypertension   . History of echocardiogram    a. 03/29/2014: EF 55-60%, mild LVH, normal RVSP;  b. 05/2015 Echo: EF 60-65%, no rwma, triv AI, mild MR, mildly dil LA.  Marland Kitchen Hyperlipidemia   . Non-obstructive CAD    a. 01/27/2014 Cath: LM nl, LAD mild diff dz w/o obs, LCx no sig obs - scattered 20-30% mLCx, RCA no obs dz, EF 70%  . Paroxysmal atrial fibrillation (HCC)    a. s/p ? ablation procedure ~ 20 yrs ago ([facility]); b. very symptomatic when goes into a-fib w/ RVR;  c. CHA2DS2VASc= 1-->ASA.  Marland Kitchen STEMI (ST elevation myocardial infarction) (HCC) 01/29/2014  . Tobacco abuse    a. ongoing - 1 ppd.      Most Recent Cardiac Studies: As above   Surgical History:  Past Surgical History:  Procedure Laterality Date  . CARDIAC CATHETERIZATION    . CARDIAC CATHETERIZATION     [facility]  . LEFT HEART CATH  Right 01/27/2014   Procedure: LEFT HEART CATH;  Surgeon: Micheline Chapman, MD;  Location: [facility] CATH LAB;  Service: Cardiovascular;  Laterality: Right;     Home Meds: Prior to Admission medications   Medication Sig Start Date End Date Taking? Authorizing Provider  aspirin EC 81 MG tablet Take 1 tablet (81 mg total) by mouth daily. 07/23/16  Yes Iran Ouch, MD  docusate sodium (COLACE) 100 MG capsule Take 100 mg by mouth daily as needed for mild constipation.   Yes [provider]  flecainide (TAMBOCOR) 100 MG tablet Take 1 tablet (100 mg total) by mouth every 12 (twelve) hours. 12/10/15  Yes Iran Ouch, MD  metoprolol (LOPRESSOR) 100 MG tablet Take 1 tablet (100 mg total) by mouth 2 (two) times daily. 03/31/16  Yes Iran Ouch, MD  sildenafil (VIAGRA) 25 MG tablet Take 1 tablet (25 mg total) by mouth daily as needed for erectile dysfunction. 01/22/15  Yes Iran Ouch, MD  buPROPion (WELLBUTRIN SR) 150 MG 12 hr tablet 1 tab qAM for 3 days, then 1 tab BID after that. Pick a day in the 2nd week Patient not taking: Reported on 01/14/2017 07/30/16   Olevia Perches P, DO  dutasteride (AVODART) 0.5 MG capsule Take 1 capsule (0.5 mg total) by mouth daily. Patient not taking: Reported on 01/14/2017 08/06/16   Olevia Perches P, DO  rosuvastatin (CRESTOR) 40 MG tablet Take 1 tablet (40 mg total) by mouth daily. 07/29/16 10/27/16  Iran Ouch, MD    Inpatient Medications:  . aspirin EC  81 mg Oral Daily  . docusate sodium  100 mg Oral BID  . flecainide  100 mg Oral Q12H  . metoprolol tartrate  100 mg Oral BID  . nitroGLYCERIN  0.5 inch Topical Q6H  . rosuvastatin  40 mg Oral Daily  . sodium chloride flush  3 mL Intravenous Q12H   . sodium chloride 50 mL/hr at 01/14/17 2055  . heparin 900 Units/hr (01/15/17 0981)    Allergies: No Known Allergies  Social History   Social History  . Marital status: Single    Spouse name: N/A  . Number of children: N/A  . Years of education: N/A   Occupational History  . Not on file.   Social History Main Topics  . Smoking status: Current Every Day Smoker    Packs/day: 1.00    Years: 30.00    Types: Cigarettes  . Smokeless tobacco: Never Used  . Alcohol use No  . Drug use: No  . Sexual activity: Not on file   Other Topics Concern  . Not on file   Social History Narrative   The patient lives in [place] with his sister. He works in a substance abuse program. 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 He is a long-time smoker, one pack per day. No illicit drugs. He is not married.      Family History  Problem Relation Age of Onset  . Coronary artery disease Father 8  . Diabetes Mother   . Diabetes Sister   . Diabetes Brother   . Diabetes Maternal Grandmother   . Diabetes Sister   . Diabetes Sister      Review of Systems: Review of Systems  Constitutional: Positive for malaise/fatigue. Negative for chills, diaphoresis, fever and weight loss.  HENT: Negative for congestion.   Eyes: Negative for discharge and redness.  Respiratory: Positive for shortness of breath. Negative  for cough, hemoptysis, sputum production and wheezing.   Cardiovascular: Positive for chest pain and palpitations. Negative for orthopnea, claudication, leg swelling and PND.  Gastrointestinal: Negative for abdominal pain, blood in stool, heartburn, melena, nausea and vomiting.  Genitourinary: Negative for hematuria.  Musculoskeletal: Negative for falls and myalgias.  Skin: Negative for rash.  Neurological: Positive for weakness. Negative for dizziness, tingling, tremors, sensory change, speech change, focal weakness and loss of consciousness.  Endo/Heme/Allergies: Does not bruise/bleed easily.  Psychiatric/Behavioral: Negative for substance abuse. The patient is not nervous/anxious.   All other systems reviewed and are negative.   Labs:  Recent Labs  01/14/17 1735 01/14/17 2143 01/15/17 0233  TROPONINI <0.03 <0.03 <0.03   Lab Results  Component Value Date   WBC 7.7 01/15/2017   HGB 13.4 01/15/2017   HCT 39.5 (L) 01/15/2017   MCV 90.7 01/15/2017   PLT 205 01/15/2017     Recent Labs Lab 01/15/17 0233  NA 140  K 4.2  CL 112*  CO2 23  BUN 20  CREATININE 1.03  CALCIUM 8.6*  GLUCOSE 107*   Lab Results  Component Value Date   CHOL 198 07/23/2016   HDL 42 07/23/2016   LDLCALC 139 (H) 07/23/2016   TRIG 87 07/23/2016   No results found for: DDIMER  Radiology/Studies:  Dg Chest Port 1 View  Result Date: 01/14/2017 CLINICAL DATA:  Chest pain today, atrial fibrillation  EXAM: PORTABLE CHEST 1 VIEW COMPARISON:  Portable exam 1757 hours compared to 08/23/2015 FINDINGS: Normal heart size, mediastinal contours and pulmonary vascularity. Atherosclerotic calcification aorta. Lungs clear. No pulmonary infiltrate, pleural effusion or pneumothorax. Bones demineralized. IMPRESSION: No acute abnormalities. Electronically Signed   By: Ulyses Southward M.D.   On: 01/14/2017 18:16    EKG: Interpreted by me showed: Afib with RVR, 122 bpm, right axis deviation, early repolarization with LVH, inferolateral TWI Telemetry: Interpreted by me showed: Afib with RVR into the 170s bpm converting to NSR at 22:02 and has remained in NSR with heart rates in the 60s bpm since  Weights: Filed Weights   01/14/17 1737 01/14/17 2138 01/15/17 0517  Weight: 170 lb (77.1 kg) 169 lb 6.4 oz (76.8 kg) 169 lb 6.4 oz (76.8 kg)     Physical Exam: Blood pressure 114/74, pulse 62, temperature 98 F (36.7 C), temperature source Oral, resp. rate 16, height 5\' 6"  (1.676 m), weight 169 lb 6.4 oz (76.8 kg), SpO2 98 %. Body mass index is 27.34 kg/m. General: Well developed, well nourished, in no acute distress. Head: Normocephalic, atraumatic, sclera non-icteric, no xanthomas, nares are without discharge.  Neck: Negative for carotid bruits. JVD not elevated. Lungs: Clear bilaterally to auscultation without wheezes, rales, or rhonchi. Breathing is unlabored. Heart: RRR with S1 S2. No murmurs, rubs, or gallops appreciated. Abdomen: Soft, non-tender, non-distended with normoactive bowel sounds. No hepatomegaly. No rebound/guarding. No obvious abdominal masses. Msk:  Strength and tone appear normal for age. Extremities: No clubbing or cyanosis. No edema. Distal pedal pulses are 2+ and equal bilaterally. Neuro: Alert and oriented X 3. No facial asymmetry. No focal deficit. Moves all extremities spontaneously. Psych:  Responds to questions appropriately with a normal affect.    Assessment and Plan:  Principal  Problem:   Atrial fibrillation with rapid ventricular response (HCC) Active Problems:   Tobacco abuse   Hyperlipidemia   Paroxysmal atrial fibrillation (HCC)   CAD in native artery   Essential hypertension    1. PAF with RVR: -Converted to sinus rhythm with heart rates  in the 60s bpm at 22:02 and has remained in sinus rhythm since -Likely in the setting of taking flecainide and Lopressor only once daily coupled with increased stress  -Recommend restarting flecainide and Lopressor at bid dosing -Recommend restarting Xarelto, he is agreeable to this -CHADS2VASc at least 2 (HTN, vascular disease) -Ambulate -If heart rate remains controlled, likely discharge today  2. Chest pain: -Likely in the setting of #1 -Symptoms improved with rate control -He declines inpatient ischemic evaluation -Resolved -If symptoms return consider outpatient evaluation   3. Hypertensive heart disease: -Controlled -Continue PTA medications  4. Financial hardships: -Smoking cessation advised -Consult case manager    Signed, Eula Listen, PA-C [facility] Pager: [phone] 01/15/2017, 8:39 AM

## 2017-01-15 NOTE — Discharge Instructions (Signed)

## 2017-01-15 NOTE — Progress Notes (Signed)
Notified Dr. Emmit Pomfret via text message of APTT > 160.

## 2017-01-15 NOTE — Progress Notes (Signed)
IV and tele removed from patient. Discharge instructions given to patient. Patient verbalized understanding. No distress at this time. Sister is at bedside and will be transporting patient home.

## 2017-01-15 NOTE — Telephone Encounter (Signed)
Please advise if ok to refill, [name] not original prescriber.

## 2017-01-15 NOTE — Telephone Encounter (Signed)
Pt discharged today from [facility] where he was admitted for afib RVR. Will look for sooner appt

## 2017-01-16 LAB — HIV ANTIBODY (ROUTINE TESTING W REFLEX): HIV Screen 4th Generation wRfx: NONREACTIVE

## 2017-01-16 NOTE — Discharge Summary (Signed)
[facility] [place] [facility]   PATIENT NAME: Darren Rogers    MR#:  [id number]  DATE OF BIRTH:  05-06-1956  DATE OF ADMISSION:  01/14/2017   ADMITTING PHYSICIAN: Katha Hamming, MD  DATE OF DISCHARGE: 01/15/2017  2:02 PM  PRIMARY CARE PHYSICIAN: Dorcas Carrow, DO   ADMISSION DIAGNOSIS:  Atrial fibrillation with RVR (HCC) [I48.91] Chest pain, unspecified type [R07.9] DISCHARGE DIAGNOSIS:  Principal Problem:   Atrial fibrillation with rapid ventricular response (HCC) Active Problems:   Tobacco abuse   Hyperlipidemia   Paroxysmal atrial fibrillation (HCC)   CAD in native artery   Essential hypertension   A-fib (HCC)  SECONDARY DIAGNOSIS:   Past Medical History:  Diagnosis Date  . Atrial fibrillation with rapid ventricular response (HCC) 01/27/2014  . Claudication (HCC)   . Erectile dysfunction   . Essential hypertension   . History of echocardiogram    a. 03/29/2014: EF 55-60%, mild LVH, normal RVSP;  b. 05/2015 Echo: EF 60-65%, no rwma, triv AI, mild MR, mildly dil LA.  Marland Kitchen Hyperlipidemia   . Non-obstructive CAD    a. 01/27/2014 Cath: LM nl, LAD mild diff dz w/o obs, LCx no sig obs - scattered 20-30% mLCx, RCA no obs dz, EF 70%  . Paroxysmal atrial fibrillation (HCC)    a. s/p ? ablation procedure ~ 20 yrs ago ([facility]); b. very symptomatic when goes into a-fib w/ RVR;  c. CHA2DS2VASc= 1-->ASA.  Marland Kitchen STEMI (ST elevation myocardial infarction) (HCC) 01/29/2014  . Tobacco abuse    a. ongoing - 1 ppd.   HOSPITAL COURSE:  61 y.o. male with  k/h/o PAF previously on Xarelto though self discontinued, ongoing tobacco abuse, hypertensive heart disease, HLD, PAD and abnormal EKG admitted with new onset of chest pain and palpitations. He was noted to be in Afib with RVR with heart rates in the 180s bpm.  Prior cardiac cath in 12/2013 showed mild nonobstructive CAD. Afib had previously been well controlled on flecainide and metoprolol bid. He was previously  on Xarelto, though self discontinued this 2/2 financial reasons. Most recent echo from 2016 showed EF 60-65%, normal wall motion, trivial AI, mild MR, and mildly dilated left atrium. Patient has been under a lot of stress lately at home and at work. He continues to stress about money. Because of his finances, he began taking flecainide and Lopressor once daily ~ 1 month ago rather than bid.    1. PAF with RVR: -Converted to sinus rhythm with heart rates in the 60s bpm and has remained in sinus rhythm since -Likely in the setting of taking flecainide and Lopressor only once daily coupled with increased stress  -Cardio recommends restarting flecainide and Lopressor at bid dosing - Also restarting Xarelto, he is agreeable to this (activating his free coupon card) -CHADS2VASc at least 2 (HTN, vascular disease)   2. Chest pain: -Likely in the setting of #1 -Symptoms improved with rate control -He declines inpatient ischemic evaluation -Resolved -If symptoms return consider outpatient evaluation   3. Hypertensive heart disease: -Controlled  4. Financial hardships: -Smoking cessation advised - case manager talked with him  He is symptom-free and wants to go home. Ambulated and his heart rate remained controlled so being discharged DISCHARGE CONDITIONS:  stable CONSULTS OBTAINED:  Treatment Team:  Antonieta Iba, MD DRUG ALLERGIES:  No Known Allergies DISCHARGE MEDICATIONS:   Allergies as of 01/15/2017   No Known Allergies     Medication List    TAKE  these medications   aspirin EC 81 MG tablet Take 1 tablet (81 mg total) by mouth daily.   buPROPion 150 MG 12 hr tablet Commonly known as:  WELLBUTRIN SR 1 tab qAM for 3 days, then 1 tab BID after that. Pick a day in the 2nd week   docusate sodium 100 MG capsule Commonly known as:  COLACE Take 100 mg by mouth daily as needed for mild constipation.   dutasteride 0.5 MG capsule Commonly known as:  AVODART Take 1 capsule  (0.5 mg total) by mouth daily.   flecainide 100 MG tablet Commonly known as:  TAMBOCOR Take 1 tablet (100 mg total) by mouth every 12 (twelve) hours.   metoprolol tartrate 100 MG tablet Commonly known as:  LOPRESSOR Take 0.5 tablets (50 mg total) by mouth 2 (two) times daily. What changed:  how much to take   rivaroxaban 20 MG Tabs tablet Commonly known as:  XARELTO Take 1 tablet (20 mg total) by mouth daily with supper.   rosuvastatin 40 MG tablet Commonly known as:  CRESTOR Take 1 tablet (40 mg total) by mouth daily.   sildenafil 25 MG tablet Commonly known as:  VIAGRA Take 1 tablet (25 mg total) by mouth daily as needed for erectile dysfunction.        DISCHARGE INSTRUCTIONS:   DIET:  Regular diet DISCHARGE CONDITION:  Good ACTIVITY:  Activity as tolerated OXYGEN:  Home Oxygen: No.  Oxygen Delivery: room air DISCHARGE LOCATION:  home   If you experience worsening of your admission symptoms, develop shortness of breath, life threatening emergency, suicidal or homicidal thoughts you must seek medical attention immediately by calling 911 or calling your MD immediately  if symptoms less severe.  You Must read complete instructions/literature along with all the possible adverse reactions/side effects for all the Medicines you take and that have been prescribed to you. Take any new Medicines after you have completely understood and accpet all the possible adverse reactions/side effects.   Please note  You were cared for by a hospitalist during your hospital stay. If you have any questions about your discharge medications or the care you received while you were in the hospital after you are discharged, you can call the unit and asked to speak with the hospitalist on call if the hospitalist that took care of you is not available. Once you are discharged, your primary care physician will handle any further medical issues. Please note that NO REFILLS for any discharge  medications will be authorized once you are discharged, as it is imperative that you return to your primary care physician (or establish a relationship with a primary care physician if you do not have one) for your aftercare needs so that they can reassess your need for medications and monitor your lab values.    On the day of Discharge:  VITAL SIGNS:  Blood pressure 116/74, pulse (!) 55, temperature 97.7 F (36.5 C), temperature source Oral, resp. rate 18, height 5\' 6"  (1.676 m), weight 76.8 kg (169 lb 6.4 oz), SpO2 98 %. PHYSICAL EXAMINATION:  GENERAL:  61 y.o.-year-old patient lying in the bed with no acute distress.  EYES: Pupils equal, round, reactive to light and accommodation. No scleral icterus. Extraocular muscles intact.  HEENT: Head atraumatic, normocephalic. Oropharynx and nasopharynx clear.  NECK:  Supple, no jugular venous distention. No thyroid enlargement, no tenderness.  LUNGS: Normal breath sounds bilaterally, no wheezing, rales,rhonchi or crepitation. No use of accessory muscles of respiration.  CARDIOVASCULAR: S1, S2  normal. No murmurs, rubs, or gallops.  ABDOMEN: Soft, non-tender, non-distended. Bowel sounds present. No organomegaly or mass.  EXTREMITIES: No pedal edema, cyanosis, or clubbing.  NEUROLOGIC: Cranial nerves II through XII are intact. Muscle strength 5/5 in all extremities. Sensation intact. Gait not checked.  PSYCHIATRIC: The patient is alert and oriented x 3.  SKIN: No obvious rash, lesion, or ulcer.  DATA REVIEW:   CBC  Recent Labs Lab 01/15/17 0233  WBC 7.7  HGB 13.4  HCT 39.5*  PLT 205    Chemistries   Recent Labs Lab 01/15/17 0233  NA 140  K 4.2  CL 112*  CO2 23  GLUCOSE 107*  BUN 20  CREATININE 1.03  CALCIUM 8.6*     Follow-up Information    Johnson, Megan P, DO. Go on 01/29/2017.   Specialty:  Family Medicine Why:  Appointment Time: 10:30am with Roosvelt Maser Contact information: [address] [place]  [address] [phone]        Iran Ouch, MD. Go on 02/09/2017.   Specialty:  Cardiology Why:  Appointment Time: 10:30am Contact information: [address] [place] [address] [phone]           Management plans discussed with the patient, family and they are in agreement.  CODE STATUS: Prior   TOTAL TIME TAKING CARE OF THIS PATIENT: 45 minutes.    Delfino Lovett M.D on 01/16/2017 at 4:55 PM  Between 7am to 6pm - Pager - [phone]  After 6pm go to [email or web address] - Social research officer, government  [facility]  Office  [phone]  CC: Primary care physician; Dorcas Carrow, DO   Note: This dictation was prepared with Dragon dictation along with smaller phrase technology. Any transcriptional errors that result from this process are unintentional.

## 2017-01-19 NOTE — Telephone Encounter (Signed)
Pt agreeable to 8/28, 3pm w/Chris Brion Aliment, NP

## 2017-01-19 NOTE — Telephone Encounter (Signed)
Patient contacted regarding discharge from [facility] on 01/15/17.  Patient understands to follow up with provider [name] on 9/11 at 10:30am at [facility] [place]. Patient understands discharge instructions? yes Patient understands medications and regiment? yes Patient understands to bring all medications to this visit? yes  Pt needs a sooner f/u office visit. He reports availability anytime this week or next. He understands I will call him back with a sooner appt.

## 2017-01-26 ENCOUNTER — Ambulatory Visit: Payer: 59 | Admitting: Nurse Practitioner

## 2017-01-29 ENCOUNTER — Inpatient Hospital Stay: Payer: 59 | Admitting: Family Medicine

## 2017-02-09 ENCOUNTER — Ambulatory Visit: Payer: 59 | Admitting: Nurse Practitioner

## 2017-02-18 ENCOUNTER — Encounter: Payer: Self-pay | Admitting: Nurse Practitioner

## 2017-02-18 ENCOUNTER — Ambulatory Visit (INDEPENDENT_AMBULATORY_CARE_PROVIDER_SITE_OTHER): Payer: 59 | Admitting: Nurse Practitioner

## 2017-02-18 VITALS — BP 100/68 | HR 59 | Ht 66.0 in | Wt 172.0 lb

## 2017-02-18 DIAGNOSIS — I739 Peripheral vascular disease, unspecified: Secondary | ICD-10-CM

## 2017-02-18 DIAGNOSIS — R079 Chest pain, unspecified: Secondary | ICD-10-CM | POA: Diagnosis not present

## 2017-02-18 DIAGNOSIS — Z720 Tobacco use: Secondary | ICD-10-CM

## 2017-02-18 DIAGNOSIS — I480 Paroxysmal atrial fibrillation: Secondary | ICD-10-CM | POA: Diagnosis not present

## 2017-02-18 DIAGNOSIS — E782 Mixed hyperlipidemia: Secondary | ICD-10-CM

## 2017-02-18 DIAGNOSIS — I10 Essential (primary) hypertension: Secondary | ICD-10-CM | POA: Diagnosis not present

## 2017-02-18 MED ORDER — ROSUVASTATIN CALCIUM 40 MG PO TABS: 40.0000 mg | ORAL_TABLET | Freq: Every day | ORAL | 5 refills | Status: DC

## 2017-02-18 MED ORDER — RIVAROXABAN 20 MG PO TABS
20.0000 mg | ORAL_TABLET | Freq: Every day | ORAL | 5 refills | Status: DC
Start: 2017-02-18 — End: 2017-04-30

## 2017-02-18 NOTE — Progress Notes (Signed)
Office Visit    Patient Name: Darren Rogers Date of Encounter: 02/18/2017  Primary Care Provider:  Dorcas Carrow, DO Primary Cardiologist:  Judie Petit. Kirke Corin, MD   Chief Complaint    61 y/o ? With a history of paroxysmal atrial fibrillation, claudication with evidence of peripheral arterial disease, hypertension, hyperlipidemia, and tobacco abuse, who presents for follow-up after recent hospitalization related to A. fib and chest pain.  Past Medical History    Past Medical History:  Diagnosis Date  . Claudication (HCC)    a. 06/2015 ABI: R - 0.73, L - 0.73. 30-49% bilat SFA stenosis. 50-74% R Profunda stenosis.  . Erectile dysfunction   . Essential hypertension   . History of echocardiogram    a. 03/29/2014: EF 55-60%, mild LVH, normal RVSP;  b. 05/2015 Echo: EF 60-65%, no rwma, triv AI, mild MR, mildly dil LA.  Marland Kitchen Hyperlipidemia   . Non-obstructive CAD    a. 01/27/2014 Cath: LM nl, LAD mild diff dz w/o obs, LCx no sig obs - scattered 20-30% mLCx, RCA no obs dz, EF 70%; b. 06/2015 MV; EF 60%, no nischemia.  Marland Kitchen PAF (paroxysmal atrial fibrillation) (HCC)    a. Pt says Dx >65yrs ago w/ ? RFCA @ [facility];  b. Recurrent 12/2013;  b. Rx Flecainide and Xarelto-->Intermittent compliance.  . Tobacco abuse    a. ongoing - 1 ppd.   Past Surgical History:  Procedure Laterality Date  . CARDIAC CATHETERIZATION    . CARDIAC CATHETERIZATION     [facility]  . LEFT HEART CATH Right 01/27/2014   Procedure: LEFT HEART CATH;  Surgeon: Micheline Chapman, MD;  Location: [facility] CATH LAB;  Service: Cardiovascular;  Laterality: Right;    Allergies  No Known Allergies  History of Present Illness    60 year old male with the above complex past medical history including paroxysmal atrial fibrillation, noncompliance, claudication, hypertension, hyperlipidemia, erectile dysfunction, and tobacco abuse. He says that he was diagnosed with atrial fibrillation greater than 20 years ago and questions whether or not he had some sort  of ablative type procedure at [facility], though A. fib ablation was not available at that time. More recently, he had recurrent A. fib in August 2015 and has been treated with flecainide and Xarelto. He underwent catheterization in 2015 which showed nonobstructive CAD. In early 2017, he had recurrent chest pain in the setting of A. fib and stress testing was performed and was low risk. He also reported bilateral lower extremity claudication and ABIs were performed and did suggest bilateral SFA and right profunda disease. He has been encouraged to quit smoking however, he continues to smoke. He was recently admitted to [facility] with recurrent A. fib and chest pain in the setting of noncompliance with flecainide and Xarelto. Once reinitiated on flecainide, he did convert to sinus rhythm and was subsequently discharged home on flecainide, metoprolol, and Xarelto. It was advised during hospitalization that he undergo ischemic evaluation related to the chest pain that he had during A. fib. Patient wished to defer at that time. He has not had any recurrent chest pain since discharge and is now willing to undergo outpatient stress testing. He reports that prior to his auscultation, he was having fairly frequent exertional chest discomfort. He also notes bilateral lower extremity claudication primarily impacting his thighs and calves. He says at this point, he can only walk about 50 yards prior to having to stop. He denies PND, orthopnea, dizziness, syncope, edema, or early satiety, though he does  note dyspnea on exertion.  Home Medications    Prior to Admission medications   Medication Sig Start Date End Date Taking? Authorizing Provider  aspirin EC 81 MG tablet Take 1 tablet (81 mg total) by mouth daily. 07/23/16  Yes Iran Ouch, MD  docusate sodium (COLACE) 100 MG capsule Take 100 mg by mouth daily as needed for mild constipation.   Yes [provider]  flecainide (TAMBOCOR) 100 MG tablet Take  1 tablet (100 mg total) by mouth every 12 (twelve) hours. 01/15/17  Yes Delfino Lovett, MD  metoprolol tartrate (LOPRESSOR) 100 MG tablet Take 0.5 tablets (50 mg total) by mouth 2 (two) times daily. 01/15/17  Yes Delfino Lovett, MD  rosuvastatin (CRESTOR) 40 MG tablet Take 1 tablet (40 mg total) by mouth daily. 02/18/17  Yes Ok Anis, NP  sildenafil (VIAGRA) 25 MG tablet Take 1 tablet (25 mg total) by mouth daily as needed for erectile dysfunction. 01/22/15  Yes Iran Ouch, MD  rivaroxaban (XARELTO) 20 MG TABS tablet Take 1 tablet (20 mg total) by mouth daily with supper. 02/18/17   Ok Anis, NP    Review of Systems    As above, he had been experiencing exertional chest discomfort and also dyspnea on exertion. He also gets occasional palpitations and bilateral lower extremity claudication. He denies PND, orthopnea, dizziness, syncope, edema, or early satiety.  All other systems reviewed and are otherwise negative except as noted above.  Physical Exam    VS:  BP 100/68 (BP Location: Left Arm, Patient Position: Sitting, Cuff Size: Normal)   Pulse (!) 59   Ht 5\' 6"  (1.676 m)   Wt 172 lb (78 kg)   BMI 27.76 kg/m  , BMI Body mass index is 27.76 kg/m. GEN: Well nourished, well developed, in no acute distress.  HEENT: normal.  Neck: Supple, no JVD, carotid bruits, or masses. Cardiac: RRR, no murmurs, rubs, or gallops. No clubbing, cyanosis, edema.  Radials/DP/PT 2+ and equal bilaterally.  Respiratory:  Respirations regular and unlabored, Coarse breath sounds with faint expiratory wheezing. GI: Soft, nontender, nondistended, BS + x 4. MS: no deformity or atrophy. Skin: warm and dry, no rash. Neuro:  Strength and sensation are intact. Psych: Normal affect.  Accessory Clinical Findings    ECG - Sinus bradycardia, 59, lateral T-wave inversion and anteroseptal J-point elevation. No acute changes.  Assessment & Plan    1.  Paroxysmal atrial fibrillation: Patient was  recently admitted with recurrent atrial fibrillation and chest pain. Troponins were normal. He was noncompliant with flecainide prior to admission and this has been resumed. He says he has taken flecainide as prescribed as well as metoprolol. He was also resumed on Xarelto at discharge and he was able to fill prescription for just $10, but he says he is not taking it because he doesn't feel like he is going to have a stroke. We discussed stroke risk at length and he has agreed to take xarelto as prescribed.  2. Chest pain: Prior to his hospitalization, patient was experiencing exertional chest discomfort and dyspnea, often in the setting of palpitations. He has a prior history of chest pain in the setting of A. fib and did have similar symptoms leading up to his hospitalization. Stress testing was recommended during the hospitalization however he deferred. He is now willing to undergo stress testing and I will arrange for a Lexiscan Myoview.  3. Bilateral lower extremity claudication: Patient has known abnormal ABIs from January 2017-0.73 bilaterally. He  has significant lifestyle limiting claudication at this point, walking only 50 yards prior to having significant thigh and calf pain causing him to stop and rest. He continues to smoke and we discussed how it will be important for him to stop smoking. I will obtain repeat ABIs and arrange for follow-up with Dr. Kirke Corin as it sounds as though he may have had progression of disease and may require angiography.  4. Essential hypertension: Stable on beta blocker.  5. Hyperlipidemia: Last lipids that we have are from February 2018, when LDL was 139. He has not been compliant with statin. I'm refilling his Crestor today.  6. Tobacco abuse: We spent approximately 20 minutes discussing nicotine addiction and the importance of smoking cessation. He has Prevacid tried Wellbutrin but says he stopped it because it made his cigarettes taste bad and his goal was never  actually to quit but cut back. We discussed the importance of complete cessation especially in light of known peripheral arterial disease. He may give Wellbutrin another try but I advised that he wait until he is truly committed to quitting in order to increase his likelihood of success.  7. Disposition: Follow-up stress test and ABIs as outlined above. Follow up in clinic in the next 3-4 weeks.  Nicolasa Ducking, NP 02/18/2017, 4:34 PM

## 2017-02-18 NOTE — Patient Instructions (Addendum)
Medication Instructions:  Your physician recommends that you continue on your current medications as directed. Please refer to the Current Medication list given to you today. Please resume xarelto 20mg  once daily  Labwork: none  Testing/Procedures: Your physician has requested that you have an ankle brachial index (ABI). During this test an ultrasound and blood pressure cuff are used to evaluate the arteries that supply the arms and legs with blood. Allow thirty minutes for this exam. There are no restrictions or special instructions.  Your physician has requested that you have a lexiscan myoview. For further information please visit [email or web address] Please follow instruction sheet, as given.  [facility] MYOVIEW  Your caregiver has ordered a Stress Test with nuclear imaging. The purpose of this test is to evaluate the blood supply to your heart muscle. This procedure is referred to as a "Non-Invasive Stress Test." This is because other than having an IV started in your vein, nothing is inserted or "invades" your body. Cardiac stress tests are done to find areas of poor blood flow to the heart by determining the extent of coronary artery disease (CAD). Some patients exercise on a treadmill, which naturally increases the blood flow to your heart, while others who are  unable to walk on a treadmill due to physical limitations have a pharmacologic/chemical stress agent called Lexiscan . This medicine will mimic walking on a treadmill by temporarily increasing your coronary blood flow.   Please note: these test may take anywhere between 2-4 hours to complete  PLEASE REPORT TO [facility] MEDICAL MALL ENTRANCE  THE VOLUNTEERS AT THE FIRST DESK WILL DIRECT YOU WHERE TO GO  Date of Procedure:__Wednesday, Sept 26______  Arrival Time for Procedure:__7:15am___  Instructions regarding medication:     __xx__:  Hold metoprolol the night before procedure and morning of procedure    PLEASE NOTIFY THE OFFICE  AT LEAST 24 HOURS IN ADVANCE IF YOU ARE UNABLE TO KEEP YOUR APPOINTMENT.  [phone] AND  PLEASE NOTIFY NUCLEAR MEDICINE AT [facility] AT LEAST 24 HOURS IN ADVANCE IF YOU ARE UNABLE TO KEEP YOUR APPOINTMENT. [phone]  How to prepare for your Myoview test:  1. Do not eat or drink after midnight 2. No caffeine for 24 hours prior to test 3. No smoking 24 hours prior to test. 4. Your medication may be taken with water.  If your doctor stopped a medication because of this test, do not take that medication. 5. Ladies, please do not wear dresses.  Skirts or pants are appropriate. Please wear a short sleeve shirt. 6. No perfume, cologne or lotion. 7. Wear comfortable walking shoes. No heels!            Follow-Up: Your physician recommends that you schedule a follow-up appointment in: 3-4 weeks with Dr. Kirke Corin.    Any Other Special Instructions Will Be Listed Below (If Applicable).     If you need a refill on your cardiac medications before your next appointment, please call your pharmacy.  Ankle-Brachial Index Test The ankle-brachial index (ABI) test is used to find peripheral vascular disease (PVD). PVD is also known as peripheral arterial disease (PAD). PVD is the blocking or hardening of the arteries anywhere within the circulatory system beyond the heart. PVD is caused by cholesterol deposits in your blood vessels (atherosclerosis). These deposits cause arteries to narrow. The delivery of oxygen to your tissues is impaired as a result. This can cause muscle pain and fatigue. This is called claudication. PVD means there may also be buildup of cholesterol in  your:  Heart. This increases the risk of heart attacks.  Brain. This increases the risk of strokes.  The ankle-brachial index test measures the blood flow in your arms and legs. This test also determines if blood vessels in your leg are narrowed by cholesterol deposits. There are additional causes of a reduced ankle-brachial  index, such as inflammation of vessels or a clot in the vessels. However, these are much less common than narrowing due to cholesterol deposits. What is being tested? The test is done while you are lying down and resting. Measurements are taken of the systolic pressure:  In your arm (brachial).  In your ankle at several points along your leg.  Systolic pressure is the pressure inside your arteries when your heart pumps. The measurements are taken several times on both sides. Then, the highest systolic pressure of the ankle is divided by the highest brachial systolic pressure. The result is the ankle-brachial pressure ratio, or ABI. Sometimes this test is repeated after you have exercised on a treadmill for five minutes. You may have leg pain during the exercise portion of the test if you suffer from PAD. If the index number drops after exercise, this may show that PAD is present. A normal ABI ratio is between 0.9 and 1.4. A value below 0.9 is considered abnormal. This information is not intended to replace advice given to you by your health care provider. Make sure you discuss any questions you have with your health care provider. Document Released: 05/22/2004 Document Revised: 10/24/2015 Document Reviewed: 12/22/2013 Elsevier Interactive Patient Education  2018 [employer]. Cardiac Nuclear Scan A cardiac nuclear scan is a test that measures blood flow to the heart when a person is resting and when he or she is exercising. The test looks for problems such as:  Not enough blood reaching a portion of the heart.  The heart muscle not working normally.  You may need this test if:  You have heart disease.  You have had abnormal lab results.  You have had heart surgery or angioplasty.  You have chest pain.  You have shortness of breath.  In this test, a radioactive dye (tracer) is injected into your bloodstream. After the tracer has traveled to your heart, an imaging device is used to  measure how much of the tracer is absorbed by or distributed to various areas of your heart. This procedure is usually done at a hospital and takes 2-4 hours. Tell a health care provider about:  Any allergies you have.  All medicines you are taking, including vitamins, herbs, eye drops, creams, and over-the-counter medicines.  Any problems you or family members have had with the use of anesthetic medicines.  Any blood disorders you have.  Any surgeries you have had.  Any medical conditions you have.  Whether you are pregnant or may be pregnant. What are the risks? Generally, this is a safe procedure. However, problems may occur, including:  Serious chest pain and heart attack. This is only a risk if the stress portion of the test is done.  Rapid heartbeat.  Sensation of warmth in your chest. This usually passes quickly.  What happens before the procedure?  Ask your health care provider about changing or stopping your regular medicines. This is especially important if you are taking diabetes medicines or blood thinners.  Remove your jewelry on the day of the procedure. What happens during the procedure?  An IV tube will be inserted into one of your veins.  Your health  care provider will inject a small amount of radioactive tracer through the tube.  You will wait for 20-40 minutes while the tracer travels through your bloodstream.  Your heart activity will be monitored with an electrocardiogram (ECG).  You will lie down on an exam table.  Images of your heart will be taken for about 15-20 minutes.  You may be asked to exercise on a treadmill or stationary bike. While you exercise, your heart's activity will be monitored with an ECG, and your blood pressure will be checked. If you are unable to exercise, you may be given a medicine to increase blood flow to parts of your heart.  When blood flow to your heart has peaked, a tracer will again be injected through the IV  tube.  After 20-40 minutes, you will get back on the exam table and have more images taken of your heart.  When the procedure is over, your IV tube will be removed. The procedure may vary among health care providers and hospitals. Depending on the type of tracer used, scans may need to be repeated 3-4 hours later. What happens after the procedure?  Unless your health care provider tells you otherwise, you may return to your normal schedule, including diet, activities, and medicines.  Unless your health care provider tells you otherwise, you may increase your fluid intake. This will help flush the contrast dye from your body. Drink enough fluid to keep your urine clear or pale yellow.  It is up to you to get your test results. Ask your health care provider, or the department that is doing the test, when your results will be ready. Summary  A cardiac nuclear scan measures the blood flow to the heart when a person is resting and when he or she is exercising.  You may need this test if you are at risk for heart disease.  Tell your health care provider if you are pregnant.  Unless your health care provider tells you otherwise, increase your fluid intake. This will help flush the contrast dye from your body. Drink enough fluid to keep your urine clear or pale yellow. This information is not intended to replace advice given to you by your health care provider. Make sure you discuss any questions you have with your health care provider. Document Released: 06/12/2004 Document Revised: 05/20/2016 Document Reviewed: 04/26/2013 Elsevier Interactive Patient Education  2017 [employer].

## 2017-02-19 ENCOUNTER — Telehealth: Payer: Self-pay | Admitting: Cardiovascular Disease

## 2017-02-19 ENCOUNTER — Other Ambulatory Visit: Payer: Self-pay

## 2017-02-19 DIAGNOSIS — I739 Peripheral vascular disease, unspecified: Secondary | ICD-10-CM

## 2017-02-19 NOTE — Telephone Encounter (Signed)
Moved LEA/ABI appt to 9/26, 2pm and f/u w/Dr. Kirke Corin 10/2, 2pm Pt agreeable.

## 2017-02-22 ENCOUNTER — Other Ambulatory Visit: Payer: Self-pay | Admitting: *Deleted

## 2017-02-22 MED ORDER — FLECAINIDE ACETATE 100 MG PO TABS: 100.0000 mg | ORAL_TABLET | Freq: Two times a day (BID) | ORAL | 3 refills | Status: DC

## 2017-02-22 NOTE — Telephone Encounter (Signed)
Requested Prescriptions   Signed Prescriptions Disp Refills  . flecainide (TAMBOCOR) 100 MG tablet 60 tablet 3    Sig: Take 1 tablet (100 mg total) by mouth every 12 (twelve) hours.    Authorizing Provider: Lorine Bears A    Ordering User: Kendrick Fries

## 2017-02-23 ENCOUNTER — Other Ambulatory Visit: Payer: Self-pay | Admitting: Cardiovascular Disease

## 2017-02-23 DIAGNOSIS — I739 Peripheral vascular disease, unspecified: Secondary | ICD-10-CM

## 2017-02-24 ENCOUNTER — Ambulatory Visit: Payer: 59

## 2017-02-24 ENCOUNTER — Encounter
Admission: RE | Admit: 2017-02-24 | Discharge: 2017-02-24 | Disposition: A | Discharge status: Home / Self Care | Payer: 59 | Source: Ambulatory Visit | Attending: Nurse Practitioner | Admitting: Nurse Practitioner

## 2017-02-24 DIAGNOSIS — R079 Chest pain, unspecified: Secondary | ICD-10-CM | POA: Insufficient documentation

## 2017-02-24 DIAGNOSIS — I739 Peripheral vascular disease, unspecified: Secondary | ICD-10-CM | POA: Diagnosis not present

## 2017-03-01 ENCOUNTER — Other Ambulatory Visit: Payer: Self-pay | Admitting: *Deleted

## 2017-03-01 MED ORDER — METOPROLOL TARTRATE 100 MG PO TABS: 50.0000 mg | ORAL_TABLET | Freq: Two times a day (BID) | ORAL | 2 refills | Status: DC

## 2017-03-02 ENCOUNTER — Ambulatory Visit (INDEPENDENT_AMBULATORY_CARE_PROVIDER_SITE_OTHER): Payer: 59 | Admitting: Cardiovascular Disease

## 2017-03-02 ENCOUNTER — Encounter: Payer: Self-pay | Admitting: Cardiovascular Disease

## 2017-03-02 VITALS — BP 100/58 | HR 57 | Ht 67.0 in | Wt 171.0 lb

## 2017-03-02 DIAGNOSIS — I10 Essential (primary) hypertension: Secondary | ICD-10-CM | POA: Diagnosis not present

## 2017-03-02 DIAGNOSIS — I480 Paroxysmal atrial fibrillation: Secondary | ICD-10-CM

## 2017-03-02 DIAGNOSIS — E782 Mixed hyperlipidemia: Secondary | ICD-10-CM

## 2017-03-02 DIAGNOSIS — Z720 Tobacco use: Secondary | ICD-10-CM | POA: Diagnosis not present

## 2017-03-02 DIAGNOSIS — I739 Peripheral vascular disease, unspecified: Secondary | ICD-10-CM

## 2017-03-02 MED ORDER — ROSUVASTATIN CALCIUM 40 MG PO TABS: 20.0000 mg | ORAL_TABLET | Freq: Every day | ORAL | 2 refills | Status: DC

## 2017-03-02 NOTE — Patient Instructions (Addendum)
Medication Instructions:  Your physician has recommended you make the following change in your medication:  STOP taking aspirin DECREASE rosuvastatin to  (1/2 tablet) once a day   Labwork: none  Testing/Procedures: Your physician has requested that you have a lexiscan myoview. For further information please visit [email or web address] Please follow instruction sheet, as given.  [facility] MYOVIEW  Your caregiver has ordered a Stress Test with nuclear imaging. The purpose of this test is to evaluate the blood supply to your heart muscle. This procedure is referred to as a "Non-Invasive Stress Test." This is because other than having an IV started in your vein, nothing is inserted or "invades" your body. Cardiac stress tests are done to find areas of poor blood flow to the heart by determining the extent of coronary artery disease (CAD). Some patients exercise on a treadmill, which naturally increases the blood flow to your heart, while others who are  unable to walk on a treadmill due to physical limitations have a pharmacologic/chemical stress agent called Lexiscan . This medicine will mimic walking on a treadmill by temporarily increasing your coronary blood flow.   Please note: these test may take anywhere between 2-4 hours to complete  PLEASE REPORT TO [facility] ENTR[facility]  THE VOLUNTEERS AT THE FIRST DESK WILL DIRECT YOU WHERE TO GO  Date of Procedure:___Wednesday, Oct 10______  Arrival Time for Procedure:____7:45am______  Instructions regarding medication:     _xx___:  Hold metoprolol the night before procedure and morning of procedure    PLEASE NOTIFY THE OFFICE AT LEAST 24 HOURS IN ADVANCE IF YOU ARE UNABLE TO KEEP YOUR APPOINTMENT.  [phone] AND  PLEASE NOTIFY NUCLEAR MEDICINE AT [facility] AT LEAST 24 HOURS IN ADVANCE IF YOU ARE UNABLE TO KEEP YOUR APPOINTMENT. [phone]  How to prepare for your Myoview test:  1. Do not eat or drink after midnight 2. No caffeine  for 24 hours prior to test 3. No smoking 24 hours prior to test. 4. Your medication may be taken with water.  If your doctor stopped a medication because of this test, do not take that medication. 5. Ladies, please do not wear dresses.  Skirts or pants are appropriate. Please wear a short sleeve shirt. 6. No perfume, cologne or lotion. 7. Wear comfortable walking shoes. No heels!            Follow-Up: Your physician recommends that you schedule a follow-up appointment in: 3 months with Dr. Kirke Corin.   Any Other Special Instructions Will Be Listed Below (If Applicable).     If you need a refill on your cardiac medications before your next appointment, please call your pharmacy.  Cardiac Nuclear Scan A cardiac nuclear scan is a test that measures blood flow to the heart when a person is resting and when he or she is exercising. The test looks for problems such as:  Not enough blood reaching a portion of the heart.  The heart muscle not working normally.  You may need this test if:  You have heart disease.  You have had abnormal lab results.  You have had heart surgery or angioplasty.  You have chest pain.  You have shortness of breath.  In this test, a radioactive dye (tracer) is injected into your bloodstream. After the tracer has traveled to your heart, an imaging device is used to measure how much of the tracer is absorbed by or distributed to various areas of your heart. This procedure is usually done at a hospital and takes  2-4 hours. Tell a health care provider about:  Any allergies you have.  All medicines you are taking, including vitamins, herbs, eye drops, creams, and over-the-counter medicines.  Any problems you or family members have had with the use of anesthetic medicines.  Any blood disorders you have.  Any surgeries you have had.  Any medical conditions you have.  Whether you are pregnant or may be pregnant. What are the risks? Generally, this is  a safe procedure. However, problems may occur, including:  Serious chest pain and heart attack. This is only a risk if the stress portion of the test is done.  Rapid heartbeat.  Sensation of warmth in your chest. This usually passes quickly.  What happens before the procedure?  Ask your health care provider about changing or stopping your regular medicines. This is especially important if you are taking diabetes medicines or blood thinners.  Remove your jewelry on the day of the procedure. What happens during the procedure?  An IV tube will be inserted into one of your veins.  Your health care provider will inject a small amount of radioactive tracer through the tube.  You will wait for 20-40 minutes while the tracer travels through your bloodstream.  Your heart activity will be monitored with an electrocardiogram (ECG).  You will lie down on an exam table.  Images of your heart will be taken for about 15-20 minutes.  You may be asked to exercise on a treadmill or stationary bike. While you exercise, your heart's activity will be monitored with an ECG, and your blood pressure will be checked. If you are unable to exercise, you may be given a medicine to increase blood flow to parts of your heart.  When blood flow to your heart has peaked, a tracer will again be injected through the IV tube.  After 20-40 minutes, you will get back on the exam table and have more images taken of your heart.  When the procedure is over, your IV tube will be removed. The procedure may vary among health care providers and hospitals. Depending on the type of tracer used, scans may need to be repeated 3-4 hours later. What happens after the procedure?  Unless your health care provider tells you otherwise, you may return to your normal schedule, including diet, activities, and medicines.  Unless your health care provider tells you otherwise, you may increase your fluid intake. This will help flush the  contrast dye from your body. Drink enough fluid to keep your urine clear or pale yellow.  It is up to you to get your test results. Ask your health care provider, or the department that is doing the test, when your results will be ready. Summary  A cardiac nuclear scan measures the blood flow to the heart when a person is resting and when he or she is exercising.  You may need this test if you are at risk for heart disease.  Tell your health care provider if you are pregnant.  Unless your health care provider tells you otherwise, increase your fluid intake. This will help flush the contrast dye from your body. Drink enough fluid to keep your urine clear or pale yellow. This information is not intended to replace advice given to you by your health care provider. Make sure you discuss any questions you have with your health care provider. Document Released: 06/12/2004 Document Revised: 05/20/2016 Document Reviewed: 04/26/2013 Elsevier Interactive Patient Education  2017 [employer].

## 2017-03-02 NOTE — Progress Notes (Signed)
Cardiology Office Note   Date:  03/02/2017   ID:  Darren Rogers, DOB 23-Jul-1955, MRN [id number]  PCP:  Dorcas Carrow, DO  Cardiologist:   Lorine Bears, MD   Chief Complaint  Patient presents with  . other    Follow up after testing. Patient c/o chest pain, SOB, and leg pain. He has been breaking out in a rash that does not itch but he thinks it can be coming from a medication. Meds reviewed verbally with patient.       History of Present Illness: Adrew Rogers is a 61 y.o. male who presents for a follow-up visit regarding paroxysmal atrial fibrillation and peripheral arterial disease. He has known history of essential hypertension, hypertensive heart disease with highly abnormal EKG, peripheral arterial disease and tobacco use. Previous cardiac catheterization in August 2015 showed mild nonobstructive coronary artery disease. Atrial fibrillation has been well-controlled with flecainide.  He is known to have peripheral arterial disease with mildly reduced ABI bilaterally. Duplex showed diffuse nonobstructive disease.  He was hospitalized in August with chest pain in the setting of atrial fibrillation with rapid ventricular response. He was not taking Xarelto and flecainide. Flecainide was resumed with restoration of sinus rhythm. He has been doing better since then. He was seen by Thayer Ohm last month and the dose of metoprolol was decreased to 50 g twice daily due to bradycardia. The patient continues to have intermittent episodes of substernal chest tightness with activities which has been random. He did not show up for his stress test. He also complains of bilateral leg pain with walking. Recent lower symptoms he'll Doppler showed mildly reduced ABI which seems slightly better than before. He complains of generalized aching. He continues to smoke half a pack per day.   Past Medical History:  Diagnosis Date  . Claudication (HCC)    a. 06/2015 ABI: R - 0.73, L - 0.73. 30-49% bilat  SFA stenosis. 50-74% R Profunda stenosis.  . Erectile dysfunction   . Essential hypertension   . History of echocardiogram    a. 03/29/2014: EF 55-60%, mild LVH, normal RVSP;  b. 05/2015 Echo: EF 60-65%, no rwma, triv AI, mild MR, mildly dil LA.  Marland Kitchen Hyperlipidemia   . Non-obstructive CAD    a. 01/27/2014 Cath: LM nl, LAD mild diff dz w/o obs, LCx no sig obs - scattered 20-30% mLCx, RCA no obs dz, EF 70%; b. 06/2015 MV; EF 60%, no nischemia.  Marland Kitchen PAF (paroxysmal atrial fibrillation) (HCC)    a. Pt says Dx >24yrs ago w/ ? RFCA @ [facility];  b. Recurrent 12/2013;  b. Rx Flecainide and Xarelto-->Intermittent compliance.  . Tobacco abuse    a. ongoing - 1 ppd.    Past Surgical History:  Procedure Laterality Date  . CARDIAC CATHETERIZATION    . CARDIAC CATHETERIZATION     [facility]  . LEFT HEART CATH Right 01/27/2014   Procedure: LEFT HEART CATH;  Surgeon: Micheline Chapman, MD;  Location[facility] CATH LAB;  Service: Cardiovascular;  Laterality: Right;     Current Outpatient Prescriptions  Medication Sig Dispense Refill  . aspirin EC 81 MG tablet Take 1 tablet (81 mg total) by mouth daily. 90 tablet 3  . docusate sodium (COLACE) 100 MG capsule Take 100 mg by mouth daily as needed for mild constipation.    . flecainide (TAMBOCOR) 100 MG tablet Take 1 tablet (100 mg total) by mouth every 12 (twelve) hours. 60 tablet 3  . metoprolol tartrate (LOPRESSOR) 100 MG  tablet Take 0.5 tablets (50 mg total) by mouth 2 (two) times daily. 60 tablet 2  . rivaroxaban (XARELTO) 20 MG TABS tablet Take 1 tablet (20 mg total) by mouth daily with supper. 30 tablet 5  . rosuvastatin (CRESTOR) 40 MG tablet Take 1 tablet (40 mg total) by mouth daily. 90 tablet 5  . sildenafil (VIAGRA) 25 MG tablet Take 1 tablet (25 mg total) by mouth daily as needed for erectile dysfunction. 8 tablet 3   No current facility-administered medications for this visit.     Allergies:   Patient has no known allergies.    Social History:  The patient   reports that he has been smoking Cigarettes.  He has a 15.00 pack-year smoking history. He has never used smokeless tobacco. He reports that he does not drink alcohol or use drugs.   Family History:  The patient's family history includes Coronary artery disease (age of onset: 65) in his father; Diabetes in his brother, maternal grandmother, mother, sister, sister, and sister.      PHYSICAL EXAM: VS:  BP (!) 100/58 (BP Location: Left Arm, Patient Position: Sitting, Cuff Size: Normal)   Pulse (!) 57   Ht  (1.702 m)   Wt 171 lb (77.6 kg)   BMI 26.78 kg/m  , BMI Body mass index is 26.78 kg/m. GEN: Well nourished, well developed, in no acute distress  HEENT: normal  Neck: no JVD, carotid bruits, or masses Cardiac: RRR; no  rubs, or gallops,no edema . There is a 2/6 systolic ejection murmur At the base Respiratory:  clear to auscultation bilaterally, normal work of breathing GI: soft, nontender, nondistended, + BS MS: no deformity or atrophy  Skin: warm and dry, no rash Neuro:  Strength and sensation are intact Psych: euthymic mood, full affect Vascular: Femoral pulses are normal bilaterally. Distal pulses are faint but palpable.   EKG:  EKG is ordered today. The ekg ordered today demonstrates normal sinus rhythm with left ventricular hypertrophy with significant repolarization abnormalities especially in the anterolateral leads.    Recent Labs: 07/23/2016: ALT 20 07/30/2016: TSH 1.080 01/15/2017: BUN 20; Creatinine, Ser 1.03; Hemoglobin 13.4; Platelets 205; Potassium 4.2; Sodium 140    Lipid Panel    Component Value Date/Time   CHOL 198 07/23/2016 1206   CHOL 234 (H) 12/06/2011 0132   TRIG 87 07/23/2016 1206   TRIG 381 (H) 12/06/2011 0132   HDL 42 07/23/2016 1206   HDL 34 (L) 12/06/2011 0132   CHOLHDL 4.7 07/23/2016 1206   CHOLHDL 6.5 11/17/2014 0427   VLDL 29 11/17/2014 0427   VLDL 76 (H) 12/06/2011 0132   LDLCALC 139 (H) 07/23/2016 1206   LDLCALC 124 (H) 12/06/2011  0132      Wt Readings from Last 3 Encounters:  03/02/17 171 lb (77.6 kg)  02/18/17 172 lb (78 kg)  01/15/17 169 lb 6.4 oz (76.8 kg)       ASSESSMENT AND PLAN:  1.  Paroxysmal atrial fibrillation :  He is maintaining in sinus rhythm with flecainide and metoprolol with no side effects.  CHADS VASc score is 2 (HTN and PAD).   I discussed with him the importance of taking flecainide and Xarelto. He is now taking Xarelto in a consistent fashion. I asked him to discontinue aspirin.  2. Chest pain: He continues to complain of chest pain. Previous cardiac catheterization showed no obstructive coronary artery disease. I'm going to reschedule his [employer].  3. Essential hypertension: Blood pressure is controlled on  metoprolol.  4. Tobacco use: discussed with him the importance of smoking cessation.   5. Hyperlipidemia: He was on atorvastatin before but his LDL was not at target. He was switched to high-dose rosuvastatin 40 mg daily. He is complaining of what seems to be generalized myalgia. Thus, I elected to decrease the dose of 20 mg daily.  6. Peripheral arterial disease: Leg pain seems to be out of proportion to his peripheral arterial disease. Femoral pulses are normal. His ABI was only mildly reduced. Continue medical therapy.    Disposition:   FU with me in 3 months  Signed,  Lorine Bears, MD  03/02/2017 2:12 PM    [facility]

## 2017-03-10 ENCOUNTER — Telehealth: Payer: Self-pay | Admitting: Cardiovascular Disease

## 2017-03-10 NOTE — Telephone Encounter (Signed)
Called patient. Patient had car troubles and was unable to make it to the appointment today and did not have a chance to call us. Car is fixed now. Patient's Lexiscan rescheduled for Friday, 03/12/17 at 0800, arrival 0745.  Reviewed preprocedural instructions and he verbalized understanding.  He will call us tomorrow if unable to make it Friday.

## 2017-03-10 NOTE — Telephone Encounter (Signed)
New message    Abby is calling stating that pt did not show up for test.

## 2017-03-12 ENCOUNTER — Telehealth: Payer: Self-pay | Admitting: Nurse Practitioner

## 2017-03-12 NOTE — Telephone Encounter (Signed)
Spoke with patient and due to weather and power being out he did not make it to his stress test this AM. Rescheduled his stress test for 03/19/17 at 09:30AM with arrival time of 09:15 AM. He verbalized understanding of all information and had no further questions or concerns at this time.

## 2017-03-12 NOTE — Telephone Encounter (Signed)
[facility] called to let us know pt did not show for stress test.

## 2017-03-29 NOTE — Telephone Encounter (Signed)
Patient no show for nm testing on 10-19  This is the 5th time   Deferred in [place]

## 2017-03-29 NOTE — Telephone Encounter (Signed)
Called patient. Patient very apologetic that he missed his last appointment. Rescheduled patient's lexiscan for 03/31/17 at 0800, arrival 0745. We reviewed pre-procedural instructions and he verbalized understanding. Message sent to pre-cert.

## 2017-04-06 ENCOUNTER — Ambulatory Visit: Payer: 59 | Admitting: Nurse Practitioner

## 2017-04-29 ENCOUNTER — Telehealth: Payer: Self-pay | Admitting: Cardiovascular Disease

## 2017-04-29 NOTE — Telephone Encounter (Signed)
Has had a strange heartbeat lately  It will be beating then a huge skip and back to beating Has been happening for a couple of days Please call to advise

## 2017-04-29 NOTE — Telephone Encounter (Signed)
S/w patient.  He reports having a feeling in his heart described as a "jump or skip." It occurs frequently throughout the day. Denies chest pain, shortness of breath, dizziness, swelling, N/V or sweating. States he's never had this feeling before and it is different than when he has felt he was in A. Fib in the past.  He takes all his medications as prescribed except for the Xarelto. He quit taking his Xarelto about 1 month ago. We discussed the importance of taking Xarelto in order to prevent stroke and that he is not protected it he does not take it. He said his insurance got it to where it was affordable he just did not want to take. He stated he would call his pharmacy today and go get the Xarelto to resume today. Patient does not monitor BP/HR at home.  His employee nurse checks it about once a month and says it is normal. I encouraged patient to monitor and record BP and HR over the next week so we could have some readings. Patient would like to know what these "skips" are. Patient was supposed to have Lexiscan which was rescheduled several times and then the last time had to be cancelled due to him changing insurance companies and it was never rescheduled after that.  Patient scheduled to see [name], NP tomorrow, 04/30/17 at 2:30 pm. Patient verbalized understanding. Routing to Dr Kirke Corin for review.

## 2017-04-30 ENCOUNTER — Ambulatory Visit (INDEPENDENT_AMBULATORY_CARE_PROVIDER_SITE_OTHER): Payer: BLUE CROSS/BLUE SHIELD

## 2017-04-30 ENCOUNTER — Ambulatory Visit (INDEPENDENT_AMBULATORY_CARE_PROVIDER_SITE_OTHER): Payer: BLUE CROSS/BLUE SHIELD | Admitting: Nurse Practitioner

## 2017-04-30 ENCOUNTER — Encounter: Payer: Self-pay | Admitting: Nurse Practitioner

## 2017-04-30 ENCOUNTER — Other Ambulatory Visit
Admission: RE | Admit: 2017-04-30 | Discharge: 2017-04-30 | Disposition: A | Discharge status: Home / Self Care | Payer: BLUE CROSS/BLUE SHIELD | Source: Ambulatory Visit | Attending: Nurse Practitioner | Admitting: Nurse Practitioner

## 2017-04-30 ENCOUNTER — Other Ambulatory Visit: Payer: Self-pay | Admitting: *Deleted

## 2017-04-30 VITALS — BP 110/60 | HR 67 | Ht 66.0 in | Wt 177.2 lb

## 2017-04-30 DIAGNOSIS — I480 Paroxysmal atrial fibrillation: Secondary | ICD-10-CM

## 2017-04-30 DIAGNOSIS — I2510 Atherosclerotic heart disease of native coronary artery without angina pectoris: Secondary | ICD-10-CM | POA: Diagnosis not present

## 2017-04-30 DIAGNOSIS — I10 Essential (primary) hypertension: Secondary | ICD-10-CM

## 2017-04-30 DIAGNOSIS — I208 Other forms of angina pectoris: Secondary | ICD-10-CM

## 2017-04-30 DIAGNOSIS — E782 Mixed hyperlipidemia: Secondary | ICD-10-CM

## 2017-04-30 DIAGNOSIS — R002 Palpitations: Secondary | ICD-10-CM

## 2017-04-30 DIAGNOSIS — I2089 Other forms of angina pectoris: Secondary | ICD-10-CM

## 2017-04-30 LAB — BASIC METABOLIC PANEL
ANION GAP: 7 (ref 5–15)
BUN: 19 mg/dL (ref 6–20)
CALCIUM: 9.6 mg/dL (ref 8.9–10.3)
CO2: 26 mmol/L (ref 22–32)
CREATININE: 1.37 mg/dL — AB (ref 0.61–1.24)
Chloride: 108 mmol/L (ref 101–111)
GFR calc Af Amer: >60 mL/min (ref >60)
GFR calc non Af Amer: 54 mL/min — ABNORMAL LOW (ref >60)
GLUCOSE: 117 mg/dL — AB (ref 65–99)
Potassium: 4.4 mmol/L (ref 3.5–5.1)
Sodium: 141 mmol/L (ref 135–145)

## 2017-04-30 LAB — MAGNESIUM: Magnesium: 1.9 mg/dL (ref 1.7–2.4)

## 2017-04-30 LAB — TSH: TSH: 1.405 u[IU]/mL (ref 0.350–4.500)

## 2017-04-30 MED ORDER — RIVAROXABAN 20 MG PO TABS: 20.0000 mg | ORAL_TABLET | Freq: Every day | ORAL | 0 refills | Status: DC

## 2017-04-30 MED ORDER — RIVAROXABAN 20 MG PO TABS: 20.0000 mg | ORAL_TABLET | Freq: Every day | ORAL | 3 refills | Status: DC

## 2017-04-30 NOTE — Progress Notes (Signed)
Office Visit    Patient Name: Darren Rogers Date of Encounter: 04/30/2017  Primary Care Provider:  Dorcas Carrow, DO Primary Cardiologist:  Judie Petit. Kirke Corin, MD   Chief Complaint    61 y/o ? with a h/o PAF, nonobs CAD, HTN, HL, ED, claudication, and tob abuse, who presents for f/u related to palpitations.  Past Medical History    Past Medical History:  Diagnosis Date  . Claudication (HCC)    a. 06/2015 ABI: R - 0.73, L - 0.73. 30-49% bilat SFA stenosis. 50-74% R Profunda stenosis.  . Erectile dysfunction   . Essential hypertension   . History of echocardiogram    a. 03/29/2014: EF 55-60%, mild LVH, normal RVSP;  b. 05/2015 Echo: EF 60-65%, no rwma, triv AI, mild MR, mildly dil LA.  Marland Kitchen Hyperlipidemia   . Non-obstructive CAD    a. 01/27/2014 Cath: LM nl, LAD mild diff dz w/o obs, LCx no sig obs - scattered 20-30% mLCx, RCA no obs dz, EF 70%; b. 06/2015 MV; EF 60%, no nischemia.  Marland Kitchen PAF (paroxysmal atrial fibrillation) (HCC)    a. Pt says Dx >77yrs ago w/ ? RFCA @ [facility];  b. Recurrent 12/2013;  b. Rx Flecainide and Xarelto-->Intermittent compliance.  . Tobacco abuse    a. ongoing - 1 ppd.   Past Surgical History:  Procedure Laterality Date  . CARDIAC CATHETERIZATION    . CARD[facility]AC CATHETERIZATION     [facility]  . LEFT HEART CATH Right 01/27/2014   Procedure: LEFT HEART CATH;  Surgeon: Micheline Chapman, MD;  Location: [facility] CATH LAB;  Service: Cardiovascular;  Laterality: Right;    Allergies  No Known Allergies  History of Present Illness    61 year old male with the above complex past medical history including paroxysmal atrial fibrillation, noncompliance, claudication, hypertension, hyperlipidemia, erectile dysfunction, and tobacco abuse.  He was diagnosed with atrial fibrillation greater than 20 years ago but more recently had recurrent A. fib in August 2015 and has been treated with flecainide and Xarelto ever since.  He underwent diagnostic catheterization 2015 in the setting of chest  pain during atrial for ablation.  That showed nonobstructive disease.  He had recurrent chest pain and A. fib in early 2017 with nonischemic Myoview at that time.  He also has a history of lower extremity claudication with ABIs previously suggesting bilateral SFA and right profunda disease.  He has been encouraged to quit smoking but unfortunately, continues to smoke.  In August 2018, he was admitted to [facility] in the setting of noncompliance with his flecainide and recurrent atrial fibrillation.  Flecainide, metoprolol, and Xarelto were reinitiated and he converted to sinus rhythm.  It was advised that he undergo stress testing though he initially deferred.  Upon follow-up in September, stress testing was ordered but he no showed for the test.  This was reordered again in October but he had changed insurances and thus needed a different prior authorization.  As of today, he has yet to have his stress test.    He reports that over the past few months, he notes intermittent exertional dyspnea and occasionally mild substernal chest tightness.  He thinks he is just out of shape as he is gained some weight since quitting drinking a few years ago.  He has not had any recurrent atrial fibrillation over the past 2 months but over the past week, has been experiencing frequent palpitations, up to 3 times per minute, described as a flip-flop in his chest.  Symptoms  are not initially persistent but are quite bothersome.  After some discussion, he says that he thinks may be symptoms are more likely to occur on days that he chooses not to take his metoprolol in the morning.  He denies PND, orthopnea, dizziness, syncope, edema, or early satiety.  Home Medications    Prior to Admission medications   Medication Sig Start Date End Date Taking? Authorizing Provider  docusate sodium (COLACE) 100 MG capsule Take 100 mg by mouth daily as needed for mild constipation.   Yes [provider]  flecainide  (TAMBOCOR) 100 MG tablet Take 1 tablet (100 mg total) by mouth every 12 (twelve) hours. 02/22/17  Yes Iran OuchArida, Muhammad A, MD  metoprolol tartrate (LOPRESSOR) 100 MG tablet Take 0.5 tablets (50 mg total) by mouth 2 (two) times daily. 03/01/17  Yes Iran OuchArida, Muhammad A, MD  rivaroxaban (XARELTO) 20 MG TABS tablet Take 1 tablet (20 mg total) by mouth daily with supper. 04/30/17  Yes Ok AnisBerge, Glenford Garis R, NP  rosuvastatin (CRESTOR) 40 MG tablet Take 0.5 tablets (20 mg total) by mouth daily. 03/02/17  Yes Iran OuchArida, Muhammad A, MD  sildenafil (VIAGRA) 25 MG tablet Take 1 tablet (25 mg total) by mouth daily as needed for erectile dysfunction. Patient not taking: Reported on 04/30/2017 01/22/15   Iran OuchArida, Muhammad A, MD    Review of Systems    Exertional dyspnea, occasional chest tightness, and palpitations as outlined above.  He denies PND, orthopnea, dizziness, syncope, edema, or early satiety.  All other systems reviewed and are otherwise negative except as noted above.  Physical Exam    VS:  BP 110/60 (BP Location: Left Arm, Patient Position: Sitting, Cuff Size: Normal)   Pulse 67   Ht 5\' 6"  (1.676 m)   Wt 177 lb 4 oz (80.4 kg)   BMI 28.61 kg/m  , BMI Body mass index is 28.61 kg/m. GEN: Well nourished, well developed, in no acute distress.  HEENT: normal.  Neck: Supple, no JVD, carotid bruits, or masses. Cardiac: RRR, 2/6 systolic murmur along the left sternal border, no rubs, or gallops. No clubbing, cyanosis, edema.  Radials/DP/PT 2+ and equal bilaterally.  Respiratory:  Respirations regular and unlabored, clear to auscultation bilaterally. GI: Soft, nontender, nondistended, BS + x 4. MS: no deformity or atrophy. Skin: warm and dry, no rash. Neuro:  Strength and sensation are intact. Psych: Normal affect.  Accessory Clinical Findings    ECG -regular sinus rhythm, 67, inferolateral ST and T abnormalities.  No acute changes.  Assessment & Plan    1.  Paroxysmal atrial fibrillation: Patient has  not had any recurrence of A. fib.  It sounds like he is mostly compliant with his medications but sometimes does miss his metoprolol.  In that setting, he has been having some palpitations, which sound like PVCs based on his description.  Continue Xarelto,  Flecainide, and metoprolol.  2.  Exertional chest discomfort and dyspnea: Patient has a prior history of experience in chest pain and dyspnea in the setting of A. fib.  Since the summer though, he is noted intermittent dyspnea on exertion which is sometimes associated with a tightness in his chest.  It was for this reason that he was advised to have a stress test dating back to August.  He has yet to have this done.  He is now willing to proceed with this.  He does not think he can walk on a treadmill and I have arranged for a Lexiscan Myoview.  Of note, he  had a negative Myoview in January 2017 and nonobstructive CAD on cath in 2015.  He is on beta-blocker and statin therapy.  3.  Essential hypertension: Stable.  4.  Palpitations: Patient describes flip-flop in his chest occurring up to 3 times per minute.  There are no associated symptoms but the palpitations are bothersome.  Based on his description, I suspect these experience and PVCs.  He has not had any recent change in his diet, smoking, caffeine habits.  I will check a basic metabolic panel, magnesium, and TSH today.  I will arrange for 48-hour zio monitor (he requested something without wires) to document what he is experiencing.  It sounds like these are more likely to occur when he misses metoprolol.  I advised that he not miss his metoprolol.  5.  Hyperlipidemia: LDL was 139 in February 2018.  He now reports compliance with statin therapy.  We can plan to follow this up next year.  6.  Peripheral arterial disease/claudication: Currently stable.  Continue statin therapy.  7.  Erectile dysfunction: He has asked for a refill on his Viagra.  I advised that we will have to wait until after his  stress test given that he has been having some exertional dyspnea and chest tightness.  8.  Disposition: I will plan to see him back in about a month to reassess symptoms.  Follow-up with Dr. Kirke Corin in 6 months or sooner if necessary.   Nicolasa Ducking, NP 04/30/2017, 4:09 PM

## 2017-04-30 NOTE — Patient Instructions (Addendum)
Medication Instructions:  Your physician recommends that you continue on your current medications as directed. Please refer to the Current Medication list given to you today.   Labwork: Your physician recommends that you return for lab work in: TODAY (BMET, MG, TSH) AT THE MEDICAL MALL. - [facility]. You will check in at the front desk to the right as you walk into the atrium. Valet Parking is offered if needed.    Testing/Procedures: Your physician has recommended that you wear an 48 HOUR ZIO event monitor. Event monitors are medical devices that record the heart's electrical activity. Doctors most often us these monitors to diagnose arrhythmias. Arrhythmias are problems with the speed or rhythm of the heartbeat. The monitor is a small, portable device. You can wear one while you do your normal daily activities. This is usually used to diagnose what is causing palpitations/syncope (passing out). - A Zio Patch Event Heart monitor will be applied to your chest today.  You will wear the patch for 48 HOUR. After 24 hours, you may shower with the heart monitor on. If you feel any SYMPTOMS, you may press and release the button in the middle of the monitor. - REMOVE ON 05/02/17 AT 4 PM.     Your physician has requested that you have a lexiscan myoview. For further information please visit [email or web address]. Please follow instruction sheet, as given.  [facility] LEXISCAN MYOVIEW  Your caregiver has ordered a Stress Test with nuclear imaging. The purpose of this test is to evaluate the blood supply to your heart muscle. This procedure is referred to as a "Non-Invasive Stress Test." This is because other than having an IV started in your vein, nothing is inserted or "invades" your body. Cardiac stress tests are done to find areas of poor blood flow to the heart by determining the extent of coronary artery disease (CAD). Some patients exercise on a treadmill, which naturally  increases the blood flow to your heart, while others who are  unable to walk on a treadmill due to physical limitations have a pharmacologic/chemical stress agent called Lexiscan . This medicine will mimic walking on a treadmill by temporarily increasing your coronary blood flow.   Please note: these test may take anywhere between 2-4 hours to complete  PLEASE REPORT TO [facility] ENTR[facility]  THE VOLUNTEERS AT THE FIRST DESK WILL DIRECT YOU WHERE TO GO  Date of Procedure:______12/6/18______________  Arrival Time for Procedure:_____07:45 AM__________  Instructions regarding medication:    _X_:  Hold betablocker(s) night before procedure and morning of procedure   PLEASE NOTIFY THE OFFICE AT LEAST 24 HOURS IN ADVANCE IF YOU ARE UNABLE TO KEEP YOUR APPOINTMENT.  [phone] AND  PLEASE NOTIFY NUCLEAR MEDICINE AT [facility] AT LEAST 24 HOURS IN ADVANCE IF YOU ARE UNABLE TO KEEP YOUR APPOINTMENT. [phone]  How to prepare for your Myoview test:  1. Do not eat or drink after midnight 2. No caffeine for 24 hours prior to test 3. No smoking 24 hours prior to test. 4. Your medication may be taken with water.  If your doctor stopped a medication because of this test, do not take that medication. 5. Ladies, please do not wear dresses.  Skirts or pants are appropriate. Please wear a short sleeve shirt. 6. No perfume, cologne or lotion. 7. Wear comfortable walking shoes. No heels!       Follow-Up: Your physician recommends that you schedule a follow-up appointment in: 1 MONTH WITH CHRIS BERGE, NP.  Your physician wants you to follow-up in: 6 MONTHS WITH DR [name].  You will receive a reminder letter in the mail two months in advance. If you don't receive a letter, please call our office to schedule the follow-up appointment.    If you need a refill on your cardiac medications before your next appointment, please call your pharmacy.    Cardiac Nuclear Scan A cardiac nuclear scan is  a test that measures blood flow to the heart when a person is resting and when he or she is exercising. The test looks for problems such as:  Not enough blood reaching a portion of the heart.  The heart muscle not working normally.  You may need this test if:  You have heart disease.  You have had abnormal lab results.  You have had heart surgery or angioplasty.  You have chest pain.  You have shortness of breath.  In this test, a radioactive dye (tracer) is injected into your bloodstream. After the tracer has traveled to your heart, an imaging device is used to measure how much of the tracer is absorbed by or distributed to various areas of your heart. This procedure is usually done at a hospital and takes 2-4 hours. Tell a health care provider about:  Any allergies you have.  All medicines you are taking, including vitamins, herbs, eye drops, creams, and over-the-counter medicines.  Any problems you or family members have had with the use of anesthetic medicines.  Any blood disorders you have.  Any surgeries you have had.  Any medical conditions you have.  Whether you are pregnant or may be pregnant. What are the risks? Generally, this is a safe procedure. However, problems may occur, including:  Serious chest pain and heart attack. This is only a risk if the stress portion of the test is done.  Rapid heartbeat.  Sensation of warmth in your chest. This usually passes quickly.  What happens before the procedure?  Ask your health care provider about changing or stopping your regular medicines. This is especially important if you are taking diabetes medicines or blood thinners.  Remove your jewelry on the day of the procedure. What happens during the procedure?  An IV tube will be inserted into one of your veins.  Your health care provider will inject a small amount of radioactive tracer through the tube.  You will wait for 20-40 minutes while the tracer travels  through your bloodstream.  Your heart activity will be monitored with an electrocardiogram (ECG).  You will lie down on an exam table.  Images of your heart will be taken for about 15-20 minutes.  You may be asked to exercise on a treadmill or stationary bike. While you exercise, your heart's activity will be monitored with an ECG, and your blood pressure will be checked. If you are unable to exercise, you may be given a medicine to increase blood flow to parts of your heart.  When blood flow to your heart has peaked, a tracer will again be injected through the IV tube.  After 20-40 minutes, you will get back on the exam table and have more images taken of your heart.  When the procedure is over, your IV tube will be removed. The procedure may vary among health care providers and hospitals. Depending on the type of tracer used, scans may need to be repeated 3-4 hours later. What happens after the procedure?  Unless your health care provider tells you otherwise, you may return to  your normal schedule, including diet, activities, and medicines.  Unless your health care provider tells you otherwise, you may increase your fluid intake. This will help flush the contrast dye from your body. Drink enough fluid to keep your urine clear or pale yellow.  It is up to you to get your test results. Ask your health care provider, or the department that is doing the test, when your results will be ready. Summary  A cardiac nuclear scan measures the blood flow to the heart when a person is resting and when he or she is exercising.  You may need this test if you are at risk for heart disease.  Tell your health care provider if you are pregnant.  Unless your health care provider tells you otherwise, increase your fluid intake. This will help flush the contrast dye from your body. Drink enough fluid to keep your urine clear or pale yellow. This information is not intended to replace advice given to you  by your health care provider. Make sure you discuss any questions you have with your health care provider. Document Released: 06/12/2004 Document Revised: 05/20/2016 Document Reviewed: 04/26/2013 Elsevier Interactive Patient Education  2017 [employer].

## 2017-05-03 NOTE — Addendum Note (Signed)
Addended by: Iverson Alamin C on: 05/03/2017 11:13 AM   Modules accepted: Orders

## 2017-05-13 ENCOUNTER — Encounter: Payer: Self-pay | Admitting: *Deleted

## 2017-05-13 NOTE — Telephone Encounter (Addendum)
Patient did not show/ cancel  for this test .  Order in wq .  Patient has been r/s for nm study several times.  Please advise if this should be taken out of wq

## 2017-05-13 NOTE — Telephone Encounter (Signed)
Sending letter to patient to call when he is able to reschedule testing.

## 2017-07-22 NOTE — Progress Notes (Deleted)
Cardiology Office Note Date:  07/22/2017  Patient ID:  Darren Rogers, Darren Rogers 08-05-1955, MRN [id number] PCP:  Dorcas Carrow, DO  Cardiologist:  Dr. Kirke Corin, MD  ***refresh   Chief Complaint: ***  History of Present Illness: Darren Rogers is a 62 y.o. male with history of nonobstructive CAD, PAF on Xarelto with intermittent compliance, HTN, HLD, ED, claudication, and tobacco abuse who presents for ***  He was diagnosed with Afib greater than 20 years ago, but more recently had recurrent Afib in 12/2013 and has been treated with flecainide and Xarelto ever since. He underwent diagnostic catheterization 2015 in the setting of chest pain during Afib. That showed nonobstructive disease. He had recurrent chest pain and Afib in early 2017 with nonischemic Myoview at that time. He also has a history of lower extremity claudication with ABIs previously suggesting bilateral SFA and right profunda disease. He has been encouraged to quit smoking but unfortunately, continues to smoke. In 12/2016, he was admitted to [facility] in the setting of noncompliance with his flecainide and recurrent Afib. Flecainide, metoprolol, and Xarelto were reinitiated and he converted to sinus rhythm. It was advised that he undergo stress testing though he initially deferred. Upon follow-up in September, stress testing was ordered but he no showed for the test. This was reordered again in October but he had changed insurances and thus needed a different prior authorization. He was most recently seen in the clinic on 04/30/2017 for follow up and noted intermittent exertional dyspnea, mild substernal chest tightness, and palpitations. He felt like his symptoms were more likely on days that he decides to not take his metoprolol. Labs showed SCr 1.37 (baseline 1.0-1.1), K+ 4.4, glucose 117, magnesium 1.9, TSH normal. He again no-showed for his Myoview. Zio monitor showed NSR, no evidence of Afib, 1 short run of SVT lasting 4 beats with a maximal  heart rate of 146 bpm, occasional PVCs with a total of 4,000 beats in 48 hours, representing 2% burden.   ***   Past Medical History:  Diagnosis Date  . Claudication (HCC)    a. 06/2015 ABI: R - 0.73, L - 0.73. 30-49% bilat SFA stenosis. 50-74% R Profunda stenosis.  . Erectile dysfunction   . Essential hypertension   . History of echocardiogram    a. 03/29/2014: EF 55-60%, mild LVH, normal RVSP;  b. 05/2015 Echo: EF 60-65%, no rwma, triv AI, mild MR, mildly dil LA.  Marland Kitchen Hyperlipidemia   . Non-obstructive CAD    a. 01/27/2014 Cath: LM nl, LAD mild diff dz w/o obs, LCx no sig obs - scattered 20-30% mLCx, RCA no obs dz, EF 70%; b. 06/2015 MV; EF 60%, no nischemia.  Marland Kitchen PAF (paroxysmal atrial fibrillation) (HCC)    a. Pt says Dx >67yrs ago w/ ? RFCA @ [facility];  b. Recurrent 12/2013;  b. Rx Flecainide and Xarelto-->Intermittent compliance.  . Tobacco abuse    a. ongoing - 1 ppd.    Past Surgical History:  Procedure Laterality Date  . CARDIAC CATHETERIZATION    . [facility]     [facility]  . LEFT HEART CATH Right 01/27/2014   Procedure: LEFT HEART CATH;  Surgeon: Micheline Chapman, MD;  Location: [facility] CATH LAB;  Service: Cardiovascular;  Laterality: Right;    No outpatient medications have been marked as taking for the 07/29/17 encounter (Appointment) with Sondra Barges, PA-C.    Allergies:   Patient has no known allergies.   Social History:  The patient  reports that he  has been smoking cigarettes.  He has a 15.00 pack-year smoking history. he has never used smokeless tobacco. He reports that he does not drink alcohol or use drugs.   Family History:  The patient's family history includes Coronary artery disease (age of onset: 23) in his father; Diabetes in his brother, maternal grandmother, mother, sister, sister, and sister.  ROS:   ROS   PHYSICAL EXAM: *** VS:  There were no vitals taken for this visit. BMI: There is no height or weight on file to calculate BMI.  Physical  Exam   EKG:  Was ordered and interpreted by me today. Shows ***  Recent Labs: 07/23/2016: ALT 20 01/15/2017: Hemoglobin 13.4; Platelets 205 04/30/2017: BUN 19; Creatinine, Ser 1.37; Magnesium 1.9; Potassium 4.4; Sodium 141; TSH 1.405  07/23/2016: Chol/HDL Ratio 4.7; Cholesterol, Total 198; HDL 42; LDL Calculated 139; Triglycerides 87   CrCl cannot be calculated (Patient's most recent lab result is older than the maximum 21 days allowed.).   Wt Readings from Last 3 Encounters:  04/30/17 177 lb 4 oz (80.4 kg)  03/02/17 171 lb (77.6 kg)  02/18/17 172 lb (78 kg)     Other studies reviewed: Additional studies/records reviewed today include: summarized above  ASSESSMENT AND PLAN:  1. ***  Disposition: F/u with *** in   Current medicines are reviewed at length with the patient today.  The patient did not have any concerns regarding medicines.  Signed, Eula Listen, PA-C 07/22/2017 7:16 AM     [facility] [place] [address] [place], [place] [address] [phone]

## 2017-07-29 ENCOUNTER — Telehealth: Payer: Self-pay | Admitting: Cardiovascular Disease

## 2017-07-29 ENCOUNTER — Emergency Department: Payer: BLUE CROSS/BLUE SHIELD

## 2017-07-29 ENCOUNTER — Encounter: Payer: Self-pay | Admitting: Emergency Medicine

## 2017-07-29 ENCOUNTER — Other Ambulatory Visit: Payer: Self-pay

## 2017-07-29 ENCOUNTER — Ambulatory Visit: Payer: BLUE CROSS/BLUE SHIELD | Admitting: Physician Assistant

## 2017-07-29 ENCOUNTER — Emergency Department
Admission: EM | Admit: 2017-07-29 | Discharge: 2017-07-29 | Disposition: A | Discharge status: Home / Self Care | Payer: BLUE CROSS/BLUE SHIELD | Attending: Emergency Medicine | Admitting: Emergency Medicine

## 2017-07-29 DIAGNOSIS — Z7901 Long term (current) use of anticoagulants: Secondary | ICD-10-CM | POA: Insufficient documentation

## 2017-07-29 DIAGNOSIS — I10 Essential (primary) hypertension: Secondary | ICD-10-CM | POA: Diagnosis not present

## 2017-07-29 DIAGNOSIS — I2510 Atherosclerotic heart disease of native coronary artery without angina pectoris: Secondary | ICD-10-CM | POA: Insufficient documentation

## 2017-07-29 DIAGNOSIS — I4891 Unspecified atrial fibrillation: Secondary | ICD-10-CM

## 2017-07-29 DIAGNOSIS — R079 Chest pain, unspecified: Secondary | ICD-10-CM

## 2017-07-29 DIAGNOSIS — F17210 Nicotine dependence, cigarettes, uncomplicated: Secondary | ICD-10-CM | POA: Diagnosis not present

## 2017-07-29 DIAGNOSIS — I480 Paroxysmal atrial fibrillation: Secondary | ICD-10-CM | POA: Insufficient documentation

## 2017-07-29 DIAGNOSIS — Z7982 Long term (current) use of aspirin: Secondary | ICD-10-CM | POA: Diagnosis not present

## 2017-07-29 DIAGNOSIS — E785 Hyperlipidemia, unspecified: Secondary | ICD-10-CM | POA: Insufficient documentation

## 2017-07-29 LAB — CBC
HCT: 42.6 % (ref 40.0–52.0)
Hemoglobin: 13.9 g/dL (ref 13.0–18.0)
MCH: 29.7 pg (ref 26.0–34.0)
MCHC: 32.6 g/dL (ref 32.0–36.0)
MCV: 91.1 fL (ref 80.0–100.0)
PLATELETS: 247 10*3/uL (ref 150–440)
RBC: 4.68 MIL/uL (ref 4.40–5.90)
RDW: 15.3 % — ABNORMAL HIGH (ref 11.5–14.5)
WBC: 8.2 10*3/uL (ref 3.8–10.6)

## 2017-07-29 LAB — BASIC METABOLIC PANEL
Anion gap: 8 (ref 5–15)
BUN: 19 mg/dL (ref 6–20)
CALCIUM: 8.7 mg/dL — AB (ref 8.9–10.3)
CO2: 22 mmol/L (ref 22–32)
CREATININE: 1.14 mg/dL (ref 0.61–1.24)
Chloride: 110 mmol/L (ref 101–111)
Glucose, Bld: 119 mg/dL — ABNORMAL HIGH (ref 65–99)
Potassium: 3.5 mmol/L (ref 3.5–5.1)
SODIUM: 140 mmol/L (ref 135–145)

## 2017-07-29 LAB — TROPONIN I

## 2017-07-29 MED ORDER — METOPROLOL TARTRATE 5 MG/5ML IV SOLN
INTRAVENOUS | Status: AC
  Administered 2017-07-29: 5 mg via INTRAVENOUS
  Filled 2017-07-29: qty 5

## 2017-07-29 MED ORDER — METOPROLOL TARTRATE 5 MG/5ML IV SOLN
5.0000 mg | Freq: Once | INTRAVENOUS | Status: AC
  Administered 2017-07-29: 5 mg via INTRAVENOUS

## 2017-07-29 NOTE — ED Provider Notes (Addendum)
Repeat troponin is negative, patient was initially scheduled to see cardiology at 930 this morning but the appointment was canceled.  I have discussed the patient with Dr. Kirke Corin we will contact him for follow-up.  EKG findings appear to be unchanged.   Emily Filbert, MD 07/29/17 [id number]    Emily Filbert, MD 07/29/17 [phone]

## 2017-07-29 NOTE — ED Triage Notes (Signed)
Patient to ER from home via [facility] for chest pain. Patient has h/o A-Fib, felt he was in A-Fib at the time. Upon EKG by EMS, patient was in A-Fib with RVR with rate up to 160. Patient was given 1 nitro spray, 324mg  ASA, and NS bolus by EMS.  BP was 157/88 initial, 122 systolic palpated after 1 Nitro spra. Patient states nitro spray has not changed pain to chest at all.

## 2017-07-29 NOTE — ED Notes (Signed)
Report from [place], [place]. Care assumed by this RN.

## 2017-07-29 NOTE — ED Notes (Addendum)
Pt requesting that his appt with Dr. Kirke Corin is cancelled this AM and to inform them that he is in ER being evaluated.  Done by this RN.

## 2017-07-29 NOTE — Telephone Encounter (Signed)
Iran Ouch, MD  Shon Baton, RN; Sandre Kitty F Cc: Darren Barges, PA-C        This patient was supposed to see [name] today but he went to the emergency room with A. fib with RVR. He converted to sinus rhythm. Please reschedule appointment. He has to be seen within 1 week.     I spoke with patient who is agreeable to appointment with [name], NP, March 5, 9:30am. Added to scheduled.

## 2017-07-29 NOTE — ED Notes (Addendum)
Pt states that his girlfriend can come pick him up, but she is in a doctor's appt until 930AM in [place].

## 2017-07-29 NOTE — ED Provider Notes (Signed)
[facility] Emergency Department Provider Note _   First MD Initiated Contact with Patient 07/29/17 [phone]     (approximate)  I have reviewed the triage vital signs and the nursing notes.   HISTORY  Chief Complaint Chest Pain   HPI Darren Rogers is a 62 y.o. male with below list of chronic medical conditions including atrial fibrillation with previous rapid ventricular response episodes presents to the emergency department with acute onset of 8 out of 10 central nonradiating chest pain which began this morning accompanied by rapid and irregular heartbeat.  Patient states as a result he took a full dose of his Lopressor (100 mg) with noted improvement in his rapid heartbeat since doing so.  Patient denies any shortness of breath.  Patient denies any diaphoresis or dizziness.  On EMS arrival patient noted to have atrial fibrillation with rapid ventricular response with the highest rate noted at 160.  EMS administered 324 mg of aspirin.  On my arrival to the room the patient's highest heart rate noted was 122.  Patient states that chest pain is improved at this time with maximum intensity of 1 out of 10 present.   Past Medical History:  Diagnosis Date  . Claudication (HCC)    a. 06/2015 ABI: R - 0.73, L - 0.73. 30-49% bilat SFA stenosis. 50-74% R Profunda stenosis.  . Erectile dysfunction   . Essential hypertension   . History of echocardiogram    a. 03/29/2014: EF 55-60%, mild LVH, normal RVSP;  b. 05/2015 Echo: EF 60-65%, no rwma, triv AI, mild MR, mildly dil LA.  Marland Kitchen Hyperlipidemia   . Non-obstructive CAD    a. 01/27/2014 Cath: LM nl, LAD mild diff dz w/o obs, LCx no sig obs - scattered 20-30% mLCx, RCA no obs dz, EF 70%; b. 06/2015 MV; EF 60%, no nischemia.  Marland Kitchen PAF (paroxysmal atrial fibrillation) (HCC)    a. Pt says Dx >49yrs ago w/ ? RFCA @ [facility];  b. Recurrent 12/2013;  b. Rx Flecainide and Xarelto-->Intermittent compliance.  . Tobacco abuse    a. ongoing - 1 ppd.      Patient Active Problem List   Diagnosis Date Noted  . A-fib (HCC) 01/15/2017  . Personal history of tobacco use, presenting hazards to health 08/17/2016  . BPH associated with nocturia 07/30/2016  . IFG (impaired fasting glucose) 07/30/2016  . Essential hypertension   . Claudication (HCC)   . 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 CAD in native artery 04/11/2014  . Paroxysmal atrial fibrillation (HCC)   . Tobacco abuse   . Hyperlipidemia   . Atrial fibrillation with rapid ventricular response (HCC) 01/27/2014    Past Surgical History:  Procedure Laterality Date  . CARDIAC CATHETERIZATION    . [facility]     [facility]  . LEFT HEART CATH Right 01/27/2014   Procedure: LEFT HEART CATH;  Surgeon: Micheline Chapman, MD;  Location: [facility] CATH LAB;  Service: Cardiovascular;  Laterality: Right;    Prior to Admission medications   Medication Sig Start Date End Date Taking? Authorizing Provider  aspirin EC 81 MG tablet Take 81 mg by mouth daily.   Yes [provider]  flecainide (TAMBOCOR) 100 MG tablet Take 1 tablet (100 mg total) by mouth every 12 (twelve) hours. 02/22/17  Yes Iran Ouch, MD  metoprolol tartrate (LOPRESSOR) 100 MG tablet Take 0.5 tablets (50 mg total) by mouth 2 (two) times daily. 03/01/17  Yes Iran Ouch, MD  rosuvastatin (CRESTOR)  40 MG tablet Take 0.5 tablets (20 mg total) by mouth daily. 03/02/17  Yes Iran Ouch, MD  rivaroxaban (XARELTO) 20 MG TABS tablet Take 1 tablet (20 mg total) by mouth daily with supper. Patient not taking: Reported on 07/29/2017 04/30/17   Creig Hines, NP  sildenafil (VIAGRA) 25 MG tablet Take 1 tablet (25 mg total) by mouth daily as needed for erectile dysfunction. Patient not taking: Reported on 04/30/2017 01/22/15   Iran Ouch, MD    Allergies No known drug allergies  Family History  Problem Relation Age of Onset  . Coronary artery disease Father 95  . Diabetes Mother    . Diabetes Sister   . Diabetes Brother   . Diabetes Maternal Grandmother   . Diabetes Sister   . Diabetes Sister     Social History Social History   Tobacco Use  . Smoking status: Current Every Day Smoker    Packs/day: 0.50    Years: 30.00    Pack years: 15.00    Types: Cigarettes  . Smokeless tobacco: Never Used  Substance Use Topics  . Alcohol use: No  . Drug use: No    Review of Systems Constitutional: No fever/chills Eyes: No visual changes. ENT: No sore throat. Cardiovascular: Positive for chest pain.  Positive for palpitations and rapid heartbeat Respiratory: Denies shortness of breath. Gastrointestinal: No abdominal pain.  No nausea, no vomiting.  No diarrhea.  No constipation. Genitourinary: Negative for dysuria. Musculoskeletal: Negative for neck pain.  Negative for back pain. Integumentary: Negative for rash. Neurological: Negative for headaches, focal weakness or numbness.   ____________________________________________   PHYSICAL EXAM:  VITAL SIGNS: ED Triage Vitals  Enc Vitals Group     BP 07/29/17 0604 (!) 153/99     Pulse Rate 07/29/17 0604 93     Resp 07/29/17 0604 (!) 24     Temp 07/29/17 0604 97.6 F (36.4 C)     Temp Source 07/29/17 0604 Oral     SpO2 07/29/17 0604 99 %     Weight 07/29/17 0606 77.1 kg (170 lb)     Height 07/29/17 0606 1.676 m (5\' 6" )     Head Circumference --      Peak Flow --      Pain Score 07/29/17 0606 1     Pain Loc --      Pain Edu? --      Excl. in GC? --     Constitutional: Alert and oriented. Well appearing and in no acute distress. Eyes: Conjunctivae are normal.  Head: Atraumatic. Mouth/Throat: Mucous membranes are moist. Oropharynx non-erythematous. Neck: No stridor.  Cardiovascular: Tachycardia with a regular rhythm. Good peripheral circulation. Grossly normal heart sounds. Respiratory: Normal respiratory effort.  No retractions. Lungs CTAB. Gastrointestinal: Soft and nontender. No distention.   Musculoskeletal: No lower extremity tenderness nor edema. No gross deformities of extremities. Neurologic:  Normal speech and language. No gross focal neurologic deficits are appreciated.  Skin:  Skin is warm, dry and intact. No rash noted. Psychiatric: Mood and affect are normal. Speech and behavior are normal. ____________________________________________   LABS (all labs ordered are listed, but only abnormal results are displayed)  Labs Reviewed  BASIC METABOLIC PANEL - Abnormal; Notable for the following components:      Result Value   Glucose, Bld 119 (*)    Calcium 8.7 (*)    All other components within normal limits  CBC - Abnormal; Notable for the following components:   RDW 15.3 (*)  All other components within normal limits  TROPONIN I   ____________________________________________  EKG  ED ECG REPORT I, [place] N BROWN, the attending physician, personally viewed and interpreted this ECG.   Date: 07/29/2017  EKG Time: 6:06 AM  Rate: 108  Rhythm: Atrial fibrillation with rapid ventricular response.  Axis: Normal  Intervals:Irregular RR interval  ST&T Change: Inferior lateral T wave inversion  ____________________________________________  RADIOLOGY I, [place] N BROWN, personally viewed and evaluated these images (plain radiographs) as part of my medical decision making, as well as reviewing the written report by the radiologist.  ED MD interpretation: No acute cardiopulmonary abnormality noted on chest x-ray  Official radiology report(s): Dg Chest Port 1 View  Result Date: 07/29/2017 CLINICAL DATA:  61 year old male with chest pain. EXAM: PORTABLE CHEST 1 VIEW COMPARISON:  Chest radiograph dated 01/14/2017 FINDINGS: There is mild cardiomegaly. No vascular congestion or edema. The lungs are clear. There is no pleural effusion or pneumothorax. No acute osseous pathology. IMPRESSION: No active disease. Electronically Signed   By: Elgie Collard M.D.   On:  07/29/2017 06:29    ____________________________________________   PROCEDURES  Critical Care performed: CRITICAL CARE Performed by: Darci Current   Total critical care time: 30 minutes  Critical care time was exclusive of separately billable procedures and treating other patients.  Critical care was necessary to treat or prevent imminent or life-threatening deterioration.  Critical care was time spent personally by me on the following activities: development of treatment plan with patient and/or surrogate as well as nursing, discussions with consultants, evaluation of patient's response to treatment, examination of patient, obtaining history from patient or surrogate, ordering and performing treatments and interventions, ordering and review of laboratory studies, ordering and review of radiographic studies, pulse oximetry and re-evaluation of patient's condition.   Procedures   ____________________________________________   INITIAL IMPRESSION / ASSESSMENT AND PLAN / ED COURSE  As part of my medical decision making, I reviewed the following data within the electronic MEDICAL RECORD NUMBER   62 year old male presented with above-stated history and physical exam of atrial fibrillation chest pain.  Patient given  IV metoprolol 5 mg further rate improvement.  Maximal heart rate noted at present 101.  Laboratory data thus far unremarkable with a troponin of less than 0.03 plan to obtain a repeat troponin.  Patient has an appointment this morning with Dr. Kennith Maes his cardiologist at 9:00 AM.  If repeat troponin negative anticipate discharge to the patient's cardiologist appointment today.     ____________________________________________  FINAL CLINICAL IMPRESSION(S) / ED DIAGNOSES  Final diagnoses:  Atrial fibrillation with rapid ventricular response (HCC)  Chest pain, unspecified type     MEDICATIONS GIVEN DURING THIS VISIT:  Medications  metoprolol tartrate (LOPRESSOR)  injection 5 mg (5 mg Intravenous Given 07/29/17 2103)     ED Discharge Orders    None       Note:  This document was prepared using Dragon voice recognition software and may include unintentional dictation errors.    Darci Current, MD 07/29/17 [phone]

## 2017-08-03 ENCOUNTER — Encounter: Payer: Self-pay | Admitting: Nurse Practitioner

## 2017-08-03 ENCOUNTER — Ambulatory Visit: Payer: BLUE CROSS/BLUE SHIELD | Admitting: Nurse Practitioner

## 2017-08-03 NOTE — Progress Notes (Signed)
Cardiology Office Note Date:  08/04/2017  Patient ID:  Darren Rogers, DOB 03-Jun-1955, MRN [id number] PCP:  Dorcas Carrow, DO  Cardiologist:  Dr. Kirke Corin, MD    Chief Complaint: ED follow up  History of Present Illness: Darren Rogers is a 62 y.o. male with history of nonobstructive CAD, PAF on Xarelto with intermittent compliance, HTN, HLD, ED, claudication, and tobacco abuse who presents for ED follow up.   He was diagnosed with Afib greater than 20 years ago, but more recently had recurrent Afib in 12/2013 and has been treated with flecainide and Xarelto ever since. He underwent diagnostic catheterization 2015 in the setting of chest pain, during Afib, that showed nonobstructive disease. He had recurrent chest pain and Afib in early 2017 with nonischemic Myoview at that time. He also has a history of lower extremity claudication with ABIs previously suggesting bilateral SFA and right profunda disease. He has been encouraged to quit smoking, but continues to smoke. In 12/2016, he was admitted to [facility] in the setting of noncompliance with his flecainide and recurrent Afib. Flecainide, metoprolol, and Xarelto were reinitiated and he converted to sinus rhythm. It was advised that he undergo stress testing though he initially deferred. Upon follow-up in September, stress testing was ordered but he no showed for the test. This was reordered again in October but he had changed insurances and thus needed a different prior authorization. He was seen in the clinic on 04/30/2017 for follow up and noted intermittent exertional dyspnea, mild substernal chest tightness, and palpitations. He felt like his symptoms were more likely on days that he decides to not take his metoprolol. Labs showed SCr 1.37 (baseline 1.0-1.1), K+ 4.4, glucose 117, magnesium 1.9, TSH normal. He again no-showed for his Myoview following his 04/2017 visit. Zio monitor showed NSR, no evidence of Afib, 1 short run of SVT lasting 4 beats with a  maximal heart rate of 146 bpm, occasional PVCs with a total of 4,000 beats in 48 hours, representing 2% burden.   He was originally scheduled to see me on 2/28, though this appointment was cancelled as he was in the ED that morning for Afib with RVR. Patient presented to [facility] ED on 2/28 with chest pain and palpitations that began that morning. Upon his arrival to the ED he was noted to be in Afib with RVR with heart rates in the 160s bpm. BP 153/99. Troponin negative x 2, K+ 3.5, SCr 1.14, WBC 8.2, HGB 13.9, PLT 247. CXR without active disease. He was given IV Lopressor with improvement in his heart rate and discharged to outpatient follow up.  He comes in doing well today.  He has not noted any further episodes of palpitations concerning for A. fib since his ED visit on 2/28 as above.  He does report occasionally missing both his evening doses of flecainide and metoprolol.  He reports compliance with Xarelto.  He is out of Crestor.  He now has a system in place where he takes his evening medications with him if he feels like he may be away from his house past 7 p.m.  Since implementing this change she has not missed any doses of his medications.  He does continue to note intermittent exertional substernal chest discomfort and dyspnea.  Symptoms are resolved with rest.  No associated tachypalpitations with this.  He feels like he would like to revisit the above previously scheduled nuclear stress testing.  He reports he will show for the test this time.  No recent falls.  No dizziness, presyncope, or syncope.  No melena or BRBPR.  He denies any orthopnea, PND, lower extremity swelling, abdominal distention, or early satiety.  He continues to smoke though has cut back significantly.  He reports he has previously been successful in quitting by smoking 1 cigarette per hour and has been doing this with plans to taper off completely.  He reports he does have Wellbutrin at home though prefers not to take  this.   Past Medical History:  Diagnosis Date  . Claudication (HCC)    a. 06/2015 ABI: R - 0.73, L - 0.73. 30-49% bilat SFA stenosis. 50-74% R Profunda stenosis.  . Erectile dysfunction   . Essential hypertension   . History of echocardiogram    a. 03/29/2014: EF 55-60%, mild LVH, normal RVSP;  b. 05/2015 Echo: EF 60-65%, no rwma, triv AI, mild MR, mildly dil LA.  Marland Kitchen Hyperlipidemia   . Non-obstructive CAD    a. 01/27/2014 Cath: LM nl, LAD mild diff dz w/o obs, LCx no sig obs - scattered 20-30% mLCx, RCA no obs dz, EF 70%; b. 06/2015 MV; EF 60%, no nischemia.  Marland Kitchen PAF (paroxysmal atrial fibrillation) (HCC)    a. Pt says Dx >62yrs ago w/ ? RFCA @ [facility];  b. Recurrent 12/2013;  b. Rx Flecainide and Xarelto-->Intermittent compliance.  . Tobacco abuse    a. ongoing - 1 ppd.    Past Surgical History:  Procedure Laterality Date  . CARDIAC CATHETERIZATION    . CARD[facility]AC CATHETERIZATION     [facility]  . LEFT HEART CATH Right 01/27/2014   Procedure: LEFT HEART CATH;  Surgeon: Micheline Chapman, MD;  Location: [facility] CATH LAB;  Service: Cardiovascular;  Laterality: Right;    Current Meds  Medication Sig  . aspirin EC 81 MG tablet Take 81 mg by mouth daily.  . flecainide (TAMBOCOR) 100 MG tablet Take 1 tablet (100 mg total) by mouth every 12 (twelve) hours.  . metoprolol tartrate (LOPRESSOR) 100 MG tablet Take 0.5 tablets (50 mg total) by mouth 2 (two) times daily.  . rivaroxaban (XARELTO) 20 MG TABS tablet Take 1 tablet (20 mg total) by mouth daily with supper.  . rosuvastatin (CRESTOR) 40 MG tablet Take 0.5 tablets (20 mg total) by mouth daily.  . sildenafil (VIAGRA) 25 MG tablet Take 1 tablet (25 mg total) by mouth daily as needed for erectile dysfunction.    Allergies:   Patient has no known allergies.   Social History:  The patient  reports that he has been smoking cigarettes.  He has a 15.00 pack-year smoking history. he has never used smokeless tobacco. He reports that he does not drink alcohol or use  drugs.   Family History:  The patient's family history includes Coronary artery disease (age of onset: 56) in his father; Diabetes in his brother, maternal grandmother, mother, sister, sister, and sister.  ROS:   Review of Systems  Constitutional: Positive for malaise/fatigue. Negative for chills, diaphoresis, fever and weight loss.  HENT: Negative for congestion.   Eyes: Negative for discharge and redness.  Respiratory: Positive for shortness of breath. Negative for cough, hemoptysis, sputum production and wheezing.   Cardiovascular: Positive for chest pain and palpitations. Negative for orthopnea, claudication, leg swelling and PND.  Gastrointestinal: Negative for abdominal pain, blood in stool, heartburn, melena, nausea and vomiting.  Genitourinary: Negative for hematuria.  Musculoskeletal: Negative for falls and myalgias.  Skin: Negative for rash.  Neurological: Negative for dizziness, tingling, tremors, sensory change,  speech change, focal weakness, loss of consciousness and weakness.  Endo/Heme/Allergies: Does not bruise/bleed easily.  Psychiatric/Behavioral: Negative for substance abuse. The patient is not nervous/anxious.   All other systems reviewed and are negative.    PHYSICAL EXAM:  VS:  BP 122/62 (BP Location: Left Arm, Patient Position: Sitting, Cuff Size: Normal)   Pulse (!) 58   Ht 5\' 6"  (1.676 m)   Wt 176 lb (79.8 kg)   SpO2 98%   BMI 28.41 kg/m  BMI: Body mass index is 28.41 kg/m.  Physical Exam  Constitutional: He is oriented to person, place, and time. He appears well-developed and well-nourished.  HENT:  Head: Normocephalic and atraumatic.  Eyes: Right eye exhibits no discharge. Left eye exhibits no discharge.  Neck: Normal range of motion. No JVD present.  Cardiovascular: Regular rhythm, S1 normal, S2 normal and normal heart sounds. Bradycardia present. Exam reveals no distant heart sounds, no friction rub, no midsystolic click and no opening snap.  No  murmur heard. Pulses:      Posterior tibial pulses are 2+ on the right side, and 2+ on the left side.  Pulmonary/Chest: Effort normal and breath sounds normal. No respiratory distress. He has no decreased breath sounds. He has no wheezes. He has no rales. He exhibits no tenderness.  Abdominal: Soft. He exhibits no distension. There is no tenderness.  Musculoskeletal: He exhibits no edema.  Neurological: He is alert and oriented to person, place, and time.  Skin: Skin is warm and dry. No cyanosis. Nails show no clubbing.  Psychiatric: He has a normal mood and affect. His speech is normal and behavior is normal. Judgment and thought content normal.     EKG:  Was ordered and interpreted by me today. Shows sinus bradycardia, 58 bpm, LVH with early repolarization, inferior lateral T wave inversion (unchanged from previous)  Recent Labs: 04/30/2017: Magnesium 1.9; TSH 1.405 07/29/2017: BUN 19; Creatinine, Ser 1.14; Hemoglobin 13.9; Platelets 247; Potassium 3.5; Sodium 140  No results found for requested labs within last 8760 hours.   Estimated Creatinine Clearance: 66.7 mL/min (by C-G formula based on SCr of 1.14 mg/dL).   Wt Readings from Last 3 Encounters:  08/04/17 176 lb (79.8 kg)  07/29/17 170 lb (77.1 kg)  04/30/17 177 lb 4 oz (80.4 kg)     Other studies reviewed: Additional studies/records reviewed today include: summarized above  ASSESSMENT AND PLAN:  1. PAF: Currently in sinus rhythm with a mildly bradycardic heart rate.  He denies any recurrence of A. fib since he was seen in the ED on 2/28.  He continues to have intermittent compliance with both his metoprolol and flecainide though reports complete compliance with his Xarelto.  He now has a system in place where he takes his evening medications with him if he is going to be out of the house the past 7 PM.  This has allowed for him to be compliant with his medications and has not missed any doses in the past week.  We will check a  BMP, CBC, magnesium, and TSH.  Continue Xarelto, flecainide, and Lopressor.   2. Exertional chest discomfort/dyspnea: Currently symptom-free.  Has been a no-show for several nuclear stress tests dating back to 2018.  I offered repeat scheduling of nuclear stress test given his continued, intermittent exertional chest discomfort with dyspnea.  He reports he is very interested in having this stress test done.  He indicates he will be compliant and follow through with stress testing as ordered at this  time.  We will plan for a Lexiscan Myoview as I do not feel like he would be able to successfully treadmill secondary to deconditioning, exertional chest discomfort/dyspnea, history of PAF, and lower extremity claudication.  For now, it is reasonable to continue him on aspirin until his stress test is completed.  If stress test is normal I recommend discontinuation of aspirin therapy with continuation of Xarelto only.  Continue beta-blocker therapy as above and statin therapy as below.  3. Palpitations: Recent outpatient cardiac monitoring as above.  Significantly improved since he was last seen in 04/2017.  He does report an intermittent "flip-flop" that has previously been felt to be PVCs.  We will check BMP, magnesium, CBC, and TSH as above.  Continue to avoid caffeine.  He notes less "flip-flop" sensation when he is compliant with medications.  4. Hyperlipidemia: Refill Crestor.  Plan for fasting lipid and liver function at follow-up visit.  5. PAD: Stable.  Continue Crestor.  Follow-up with primary cardiologist.  6. Tobacco abuse: Complete cessation is advised.   Disposition: F/u with Dr. Kirke Corin in 3 months.  Current medicines are reviewed at length with the patient today.  The patient did not have any concerns regarding medicines.  Signed, Eula Listen, PA-C 08/04/2017 3:16 PM     [facility] [address] [place], [place] [address] [phone]

## 2017-08-04 ENCOUNTER — Ambulatory Visit (INDEPENDENT_AMBULATORY_CARE_PROVIDER_SITE_OTHER): Payer: BLUE CROSS/BLUE SHIELD | Admitting: Physician Assistant

## 2017-08-04 ENCOUNTER — Encounter: Payer: Self-pay | Admitting: Physician Assistant

## 2017-08-04 VITALS — BP 122/62 | HR 58 | Ht 66.0 in | Wt 176.0 lb

## 2017-08-04 DIAGNOSIS — R079 Chest pain, unspecified: Secondary | ICD-10-CM | POA: Diagnosis not present

## 2017-08-04 DIAGNOSIS — I739 Peripheral vascular disease, unspecified: Secondary | ICD-10-CM | POA: Diagnosis not present

## 2017-08-04 DIAGNOSIS — R002 Palpitations: Secondary | ICD-10-CM | POA: Diagnosis not present

## 2017-08-04 DIAGNOSIS — Z720 Tobacco use: Secondary | ICD-10-CM | POA: Diagnosis not present

## 2017-08-04 DIAGNOSIS — I480 Paroxysmal atrial fibrillation: Secondary | ICD-10-CM | POA: Diagnosis not present

## 2017-08-04 DIAGNOSIS — E782 Mixed hyperlipidemia: Secondary | ICD-10-CM

## 2017-08-04 MED ORDER — ROSUVASTATIN CALCIUM 20 MG PO TABS: 20.0000 mg | ORAL_TABLET | Freq: Every day | ORAL | 3 refills | Status: DC

## 2017-08-04 NOTE — Patient Instructions (Addendum)
Medication Instructions:  Your physician recommends that you continue on your current medications as directed. Please refer to the Current Medication list given to you today.   Labwork: BMET, CBC, TSH, magnesium  Testing/Procedures: Your physician has requested that you have a lexiscan myoview. For further information please visit [email or web address] Please follow instruction sheet, as given.  [facility] MYOVIEW  Your caregiver has ordered a Stress Test with nuclear imaging. The purpose of this test is to evaluate the blood supply to your heart muscle. This procedure is referred to as a "Non-Invasive Stress Test." This is because other than having an IV started in your vein, nothing is inserted or "invades" your body. Cardiac stress tests are done to find areas of poor blood flow to the heart by determining the extent of coronary artery disease (CAD). Some patients exercise on a treadmill, which naturally increases the blood flow to your heart, while others who are  unable to walk on a treadmill due to physical limitations have a pharmacologic/chemical stress agent called Lexiscan . This medicine will mimic walking on a treadmill by temporarily increasing your coronary blood flow.   Please note: these test may take anywhere between 2-4 hours to complete  PLEASE REPORT TO [facility] MEDICAL MALL ENTRANCE  THE VOLUNTEERS AT THE FIRST DESK WILL DIRECT YOU WHERE TO GO  Date of Procedure:_____________________________________  Arrival Time for Procedure:______________________________  Instructions regarding medication:   You may take your morning medications with a sip of water.   PLEASE NOTIFY THE OFFICE AT LEAST 24 HOURS IN ADVANCE IF YOU ARE UNABLE TO KEEP YOUR APPOINTMENT.  [phone] AND  PLEASE NOTIFY NUCLEAR MEDICINE AT [facility] AT LEAST 24 HOURS IN ADVANCE IF YOU ARE UNABLE TO KEEP YOUR APPOINTMENT. [phone]  How to prepare for your Myoview test:  1. Do not eat or drink after  midnight 2. No caffeine for 24 hours prior to test 3. No smoking 24 hours prior to test. 4. Your medication may be taken with water.  If your doctor stopped a medication because of this test, do not take that medication. 5. Ladies, please do not wear dresses.  Skirts or pants are appropriate. Please wear a short sleeve shirt. 6. No perfume, cologne or lotion. 7. Wear comfortable walking shoes. No heels!            Follow-Up: Your physician recommends that you schedule a follow-up appointment in: 3 months with Dr. Kirke Corin.    Any Other Special Instructions Will Be Listed Below (If Applicable).     If you need a refill on your cardiac medications before your next appointment, please call your pharmacy.  Cardiac Nuclear Scan A cardiac nuclear scan is a test that measures blood flow to the heart when a person is resting and when he or she is exercising. The test looks for problems such as:  Not enough blood reaching a portion of the heart.  The heart muscle not working normally.  You may need this test if:  You have heart disease.  You have had abnormal lab results.  You have had heart surgery or angioplasty.  You have chest pain.  You have shortness of breath.  In this test, a radioactive dye (tracer) is injected into your bloodstream. After the tracer has traveled to your heart, an imaging device is used to measure how much of the tracer is absorbed by or distributed to various areas of your heart. This procedure is usually done at a hospital and takes 2-4 hours. Tell a  health care provider about:  Any allergies you have.  All medicines you are taking, including vitamins, herbs, eye drops, creams, and over-the-counter medicines.  Any problems you or family members have had with the use of anesthetic medicines.  Any blood disorders you have.  Any surgeries you have had.  Any medical conditions you have.  Whether you are pregnant or may be pregnant. What are the  risks? Generally, this is a safe procedure. However, problems may occur, including:  Serious chest pain and heart attack. This is only a risk if the stress portion of the test is done.  Rapid heartbeat.  Sensation of warmth in your chest. This usually passes quickly.  What happens before the procedure?  Ask your health care provider about changing or stopping your regular medicines. This is especially important if you are taking diabetes medicines or blood thinners.  Remove your jewelry on the day of the procedure. What happens during the procedure?  An IV tube will be inserted into one of your veins.  Your health care provider will inject a small amount of radioactive tracer through the tube.  You will wait for 20-40 minutes while the tracer travels through your bloodstream.  Your heart activity will be monitored with an electrocardiogram (ECG).  You will lie down on an exam table.  Images of your heart will be taken for about 15-20 minutes.  You may be asked to exercise on a treadmill or stationary bike. While you exercise, your heart's activity will be monitored with an ECG, and your blood pressure will be checked. If you are unable to exercise, you may be given a medicine to increase blood flow to parts of your heart.  When blood flow to your heart has peaked, a tracer will again be injected through the IV tube.  After 20-40 minutes, you will get back on the exam table and have more images taken of your heart.  When the procedure is over, your IV tube will be removed. The procedure may vary among health care providers and hospitals. Depending on the type of tracer used, scans may need to be repeated 3-4 hours later. What happens after the procedure?  Unless your health care provider tells you otherwise, you may return to your normal schedule, including diet, activities, and medicines.  Unless your health care provider tells you otherwise, you may increase your fluid  intake. This will help flush the contrast dye from your body. Drink enough fluid to keep your urine clear or pale yellow.  It is up to you to get your test results. Ask your health care provider, or the department that is doing the test, when your results will be ready. Summary  A cardiac nuclear scan measures the blood flow to the heart when a person is resting and when he or she is exercising.  You may need this test if you are at risk for heart disease.  Tell your health care provider if you are pregnant.  Unless your health care provider tells you otherwise, increase your fluid intake. This will help flush the contrast dye from your body. Drink enough fluid to keep your urine clear or pale yellow. This information is not intended to replace advice given to you by your health care provider. Make sure you discuss any questions you have with your health care provider. Document Released: 06/12/2004 Document Revised: 05/20/2016 Document Reviewed: 04/26/2013 Elsevier Interactive Patient Education  2017 [employer].

## 2017-08-05 LAB — BASIC METABOLIC PANEL
BUN/Creatinine Ratio: 16 (ref 10–24)
BUN: 18 mg/dL (ref 8–27)
CHLORIDE: 106 mmol/L (ref 96–106)
CO2: 25 mmol/L (ref 20–29)
Calcium: 9.5 mg/dL (ref 8.6–10.2)
Creatinine, Ser: 1.16 mg/dL (ref 0.76–1.27)
GFR calc Af Amer: 78 mL/min/{1.73_m2} (ref >59)
GFR, EST NON AFRICAN AMERICAN: 67 mL/min/{1.73_m2} (ref >59)
GLUCOSE: 75 mg/dL (ref 65–99)
POTASSIUM: 4.2 mmol/L (ref 3.5–5.2)
Sodium: 144 mmol/L (ref 134–144)

## 2017-08-05 LAB — CBC
Hematocrit: 41.6 % (ref 37.5–51.0)
Hemoglobin: 13.6 g/dL (ref 13.0–17.7)
MCH: 30.3 pg (ref 26.6–33.0)
MCHC: 32.7 g/dL (ref 31.5–35.7)
MCV: 93 fL (ref 79–97)
PLATELETS: 265 10*3/uL (ref 150–379)
RBC: 4.49 x10E6/uL (ref 4.14–5.80)
RDW: 15.2 % (ref 12.3–15.4)
WBC: 6.3 10*3/uL (ref 3.4–10.8)

## 2017-08-05 LAB — MAGNESIUM: MAGNESIUM: 2 mg/dL (ref 1.6–2.3)

## 2017-08-05 LAB — TSH: TSH: 1.42 u[IU]/mL (ref 0.450–4.500)

## 2017-08-12 ENCOUNTER — Encounter: Payer: BLUE CROSS/BLUE SHIELD | Attending: Physician Assistant

## 2017-08-19 ENCOUNTER — Other Ambulatory Visit: Payer: Self-pay

## 2017-08-19 ENCOUNTER — Telehealth: Payer: Self-pay | Admitting: *Deleted

## 2017-08-19 MED ORDER — FLECAINIDE ACETATE 100 MG PO TABS: 100.0000 mg | ORAL_TABLET | Freq: Two times a day (BID) | ORAL | 3 refills | Status: DC

## 2017-08-19 NOTE — Telephone Encounter (Signed)
Left message for patient to notify them that it is time to schedule annual low dose lung cancer screening CT scan. Instructed patient to call back to verify information prior to the scan being scheduled.  

## 2017-08-24 ENCOUNTER — Telehealth: Payer: Self-pay | Admitting: *Deleted

## 2017-08-24 DIAGNOSIS — Z122 Encounter for screening for malignant neoplasm of respiratory organs: Secondary | ICD-10-CM

## 2017-08-24 DIAGNOSIS — Z87891 Personal history of nicotine dependence: Secondary | ICD-10-CM

## 2017-08-24 NOTE — Telephone Encounter (Signed)
Notified patient that annual lung cancer screening low dose CT scan is due currently or will be in near future. Confirmed that patient is within the age range of 55-77, and asymptomatic, (no signs or symptoms of lung cancer). Patient denies illness that would prevent curative treatment for lung cancer if found. Verified smoking history, (current, 30.5 pack year). The shared decision making visit was done 08/17/16. Patient is agreeable for CT scan being scheduled.

## 2017-09-01 ENCOUNTER — Telehealth: Payer: Self-pay | Admitting: *Deleted

## 2017-09-01 ENCOUNTER — Ambulatory Visit: Admission: RE | Admit: 2017-09-01 | Payer: BLUE CROSS/BLUE SHIELD | Source: Ambulatory Visit

## 2017-09-01 NOTE — Telephone Encounter (Signed)
voicemail left in attempt to reschedule no show for lung screening scan.

## 2017-09-23 ENCOUNTER — Telehealth: Payer: Self-pay | Admitting: Cardiovascular Disease

## 2017-09-23 NOTE — Telephone Encounter (Signed)
This is just fine for pt to take, there are no interactions with Robaxin and any of his other medications.

## 2017-09-23 NOTE — Telephone Encounter (Signed)
Patient reports pulled muscle in his arm and inquires if he can take tylenol and/or Robaxin.  The nurse at his work would like to prescribe Robaxin but needs confirmation this is acceptable with his current medications. We also reviewed medications to avoid such as NSAIDs as he takes aspirin 81mg  and Xarelto. Will route to pharmacist for advice regarding Robaxin.

## 2017-09-23 NOTE — Telephone Encounter (Signed)
Pt has a pulled muscle and wants to know if he can take tylenol and Robaxin. Please call and advise

## 2017-09-23 NOTE — Telephone Encounter (Signed)
Patient notified of pharmacy recommendations. He is appreciative of the information.

## 2017-11-04 ENCOUNTER — Encounter: Payer: Self-pay | Admitting: Cardiovascular Disease

## 2017-11-04 ENCOUNTER — Ambulatory Visit (INDEPENDENT_AMBULATORY_CARE_PROVIDER_SITE_OTHER): Payer: BLUE CROSS/BLUE SHIELD | Admitting: Cardiovascular Disease

## 2017-11-04 VITALS — BP 110/70 | HR 62 | Ht 66.0 in | Wt 167.5 lb

## 2017-11-04 DIAGNOSIS — Z720 Tobacco use: Secondary | ICD-10-CM

## 2017-11-04 DIAGNOSIS — I480 Paroxysmal atrial fibrillation: Secondary | ICD-10-CM

## 2017-11-04 DIAGNOSIS — E785 Hyperlipidemia, unspecified: Secondary | ICD-10-CM

## 2017-11-04 DIAGNOSIS — I10 Essential (primary) hypertension: Secondary | ICD-10-CM | POA: Diagnosis not present

## 2017-11-04 DIAGNOSIS — I739 Peripheral vascular disease, unspecified: Secondary | ICD-10-CM | POA: Diagnosis not present

## 2017-11-04 NOTE — Patient Instructions (Signed)
Medication Instructions:  Your physician recommends that you continue on your current medications as directed. Please refer to the Current Medication list given to you today.   Labwork: Lipid and Lft today  Testing/Procedures: None ordered  Follow-Up: Your physician recommends that you schedule a follow-up appointment in: 6 months with Dr.[name]   Any Other Special Instructions Will Be Listed Below (If Applicable).     If you need a refill on your cardiac medications before your next appointment, please call your pharmacy.

## 2017-11-04 NOTE — Progress Notes (Signed)
Cardiology Office Note   Date:  11/04/2017   ID:  Darren Rogers, DOB 1956-01-12, MRN [id number]  PCP:  Dorcas Carrow, DO  Cardiologist:   Lorine Bears, MD   Chief Complaint  Patient presents with  . Other    3 month follow up. Patient c/o Leg pain when walking. Patient states SOB has improved. Meds reviewed verbally with patient.       History of Present Illness: Darren Rogers is a 62 y.o. male who presents for a follow-up visit regarding paroxysmal atrial fibrillation and peripheral arterial disease. He has known history of essential hypertension, hypertensive heart disease with highly abnormal EKG, peripheral arterial disease and tobacco use. Previous cardiac catheterization in August 2015 showed mild nonobstructive coronary artery disease. Atrial fibrillation has been well-controlled with flecainide.  He is known to have peripheral arterial disease with mildly reduced ABI bilaterally. Duplex showed diffuse nonobstructive disease.  He had an episode of atrial fibrillation with rapid ventricular response in February which required an emergency room visit.  There was some compliance issues with the evening dose of flecainide and metoprolol.  He has been doing well overall and reports improvement in symptoms.  No recent chest pain.  He describes brief palpitations but no prolonged tachycardia.  Shortness of breath is stable.  He reports that his leg pain with walking is also stable.  Usually it happens if he is walking uphill after about 1000 feet.  He describes pain in the anterior thighs and calfs.  He continues to smoke half a pack per day.  He has not been taking Xarelto because he does not like blood thinners.  Past Medical History:  Diagnosis Date  . Claudication (HCC)    a. 06/2015 ABI: R - 0.73, L - 0.73. 30-49% bilat SFA stenosis. 50-74% R Profunda stenosis.  . Erectile dysfunction   . Essential hypertension   . History of echocardiogram    a. 03/29/2014: EF 55-60%,  mild LVH, normal RVSP;  b. 05/2015 Echo: EF 60-65%, no rwma, triv AI, mild MR, mildly dil LA.  Marland Kitchen Hyperlipidemia   . Non-obstructive CAD    a. 01/27/2014 Cath: LM nl, LAD mild diff dz w/o obs, LCx no sig obs - scattered 20-30% mLCx, RCA no obs dz, EF 70%; b. 06/2015 MV; EF 60%, no nischemia.  Marland Kitchen PAF (paroxysmal atrial fibrillation) (HCC)    a. Pt says Dx >24yrs ago w/ ? RFCA @ [facility];  b. Recurrent 12/2013;  b. Rx Flecainide and Xarelto-->Intermittent compliance.  . Tobacco abuse    a. ongoing - 1 ppd.    Past Surgical History:  Procedure Laterality Date  . CARDIAC CATHETERIZATION    . CARD[facility]IZATION     [facility]  . LEFT HEART CATH Right 01/27/2014   Procedure: LEFT HEART CATH;  Surgeon: Micheline Chapman, MD;  Location: [facility] CATH LAB;  Service: Cardiovascular;  Laterality: Right;     Current Outpatient Medications  Medication Sig Dispense Refill  . aspirin EC 81 MG tablet Take 81 mg by mouth daily.    . flecainide (TAMBOCOR) 100 MG tablet Take 1 tablet (100 mg total) by mouth every 12 (twelve) hours. 60 tablet 3  . metoprolol tartrate (LOPRESSOR) 100 MG tablet Take 0.5 tablets (50 mg total) by mouth 2 (two) times daily. 60 tablet 2  . rosuvastatin (CRESTOR) 20 MG tablet Take 1 tablet (20 mg total) by mouth daily. 90 tablet 3  . sildenafil (VIAGRA) 25 MG tablet Take 1 tablet (25 mg  total) by mouth daily as needed for erectile dysfunction. 8 tablet 3  . rivaroxaban (XARELTO) 20 MG TABS tablet Take 1 tablet (20 mg total) by mouth daily with supper. (Patient not taking: Reported on 11/04/2017) 90 tablet 3   No current facility-administered medications for this visit.     Allergies:   Patient has no known allergies.    Social History:  The patient  reports that he has been smoking cigarettes.  He has a 15.00 pack-year smoking history. He has never used smokeless tobacco. He reports that he does not drink alcohol or use drugs.   Family History:  The patient's family history includes Coronary  artery disease (age of onset: 67) in his father; Diabetes in his brother, maternal grandmother, mother, sister, sister, and sister.      PHYSICAL EXAM: VS:  BP 110/70 (BP Location: Left Arm, Patient Position: Sitting, Cuff Size: Normal)   Pulse 62   Ht 5\' 6"  (1.676 m)   Wt 167 lb 8 oz (76 kg)   BMI 27.04 kg/m  , BMI Body mass index is 27.04 kg/m. GEN: Well nourished, well developed, in no acute distress  HEENT: normal  Neck: no JVD, carotid bruits, or masses Cardiac: RRR; no  rubs, or gallops,no edema . There is a 2/6 systolic ejection murmur At the base Respiratory:  clear to auscultation bilaterally, normal work of breathing GI: soft, nontender, nondistended, + BS MS: no deformity or atrophy  Skin: warm and dry, no rash Neuro:  Strength and sensation are intact Psych: euthymic mood, full affect Vascular: Femoral pulses are normal bilaterally. Distal pulses are faint but palpable.   EKG:  EKG is ordered today. The ekg ordered today demonstrates normal sinus rhythm with minimal LVH.  Chronic anterolateral ST elevation and T wave inversion from V3 to V6.  Recent Labs: 08/04/2017: BUN 18; Creatinine, Ser 1.16; Hemoglobin 13.6; Magnesium 2.0; Platelets 265; Potassium 4.2; Sodium 144; TSH 1.420    Lipid Panel    Component Value Date/Time   CHOL 198 07/23/2016 1206   CHOL 234 (H) 12/06/2011 0132   TRIG 87 07/23/2016 1206   TRIG 381 (H) 12/06/2011 0132   HDL 42 07/23/2016 1206   HDL 34 (L) 12/06/2011 0132   CHOLHDL 4.7 07/23/2016 1206   CHOLHDL 6.5 11/17/2014 0427   VLDL 29 11/17/2014 0427   VLDL 76 (H) 12/06/2011 0132   LDLCALC 139 (H) 07/23/2016 1206   LDLCALC 124 (H) 12/06/2011 0132      Wt Readings from Last 3 Encounters:  11/04/17 167 lb 8 oz (76 kg)  08/04/17 176 lb (79.8 kg)  07/29/17 170 lb (77.1 kg)       ASSESSMENT AND PLAN:  1.  Paroxysmal atrial fibrillation :  He is maintaining in sinus rhythm with flecainide and metoprolol with no side effects.   CHADS VASc score is 2 (HTN and PAD).  He has not been taking Xarelto in spite of multiple discussions with him in the past.  He does not want to be on a blood thinner at the present time.  Continue aspirin for now.  2. Chest pain: He has history of chronic atypical chest pain with no previous obstructive disease on prior cardiac catheterization.  He reports no recent chest pain.  3. Essential hypertension: Blood pressure is controlled on metoprolol.  4. Tobacco use: discussed with him the importance of smoking cessation.   5. Hyperlipidemia:  Currently on rosuvastatin 20 mg daily.  Check fasting lipid and liver profile today.  6. Peripheral arterial disease: Stable mild claudication with known mildly reduced ABI.  Continue to treat medically for now and consider repeat Doppler later this year.   Disposition:   FU with me in 6 months  Signed,  Lorine Bears, MD  11/04/2017 8:58 AM    [facility]

## 2017-11-05 ENCOUNTER — Encounter: Payer: Self-pay | Admitting: *Deleted

## 2017-11-05 LAB — LIPID PANEL
CHOL/HDL RATIO: 3.6 ratio (ref 0.0–5.0)
Cholesterol, Total: 151 mg/dL (ref 100–199)
HDL: 42 mg/dL (ref >39)
LDL CALC: 86 mg/dL (ref 0–99)
Triglycerides: 115 mg/dL (ref 0–149)
VLDL Cholesterol Cal: 23 mg/dL (ref 5–40)

## 2017-11-05 LAB — HEPATIC FUNCTION PANEL
ALBUMIN: 4.1 g/dL (ref 3.6–4.8)
ALT: 21 IU/L (ref 0–44)
AST: 23 IU/L (ref 0–40)
Alkaline Phosphatase: 69 IU/L (ref 39–117)
BILIRUBIN TOTAL: 0.3 mg/dL (ref 0.0–1.2)
Bilirubin, Direct: 0.1 mg/dL (ref 0.00–0.40)
TOTAL PROTEIN: 6.4 g/dL (ref 6.0–8.5)

## 2017-11-17 ENCOUNTER — Encounter: Payer: Self-pay | Admitting: *Deleted

## 2017-12-21 ENCOUNTER — Telehealth: Payer: Self-pay | Admitting: Cardiovascular Disease

## 2017-12-21 ENCOUNTER — Other Ambulatory Visit: Payer: Self-pay

## 2017-12-21 MED ORDER — METOPROLOL TARTRATE 100 MG PO TABS: 50.0000 mg | ORAL_TABLET | Freq: Two times a day (BID) | ORAL | 0 refills | Status: DC

## 2017-12-21 NOTE — Telephone Encounter (Signed)
metoprolol tartrate (LOPRESSOR) 100 MG tablet 90 tablet 0 12/21/2017    Sig - Route: Take 0.5 tablets (50 mg total) by mouth 2 (two) times daily. - Oral   Sent to pharmacy as: metoprolol tartrate (LOPRESSOR) 100 MG tablet   E-Prescribing Status: Receipt confirmed by pharmacy (12/21/2017 11:34 AM EDT)   Pharmacy   [facility] - [place], [place] - [address]

## 2017-12-21 NOTE — Telephone Encounter (Signed)
°*  STAT* If patient is at the pharmacy, call can be transferred to refill team.   1. Which medications need to be refilled? (please list name of each medication and dose if known) Metoprolol 100 mg   2. Which pharmacy/location (including street and city if local pharmacy) is medication to be sent to? [facility]   3. Do they need a 30 day or 90 day supply? 90 day

## 2018-03-21 ENCOUNTER — Encounter: Payer: Self-pay | Admitting: Emergency Medicine

## 2018-03-21 ENCOUNTER — Other Ambulatory Visit: Payer: Self-pay

## 2018-03-21 ENCOUNTER — Emergency Department
Admission: EM | Admit: 2018-03-21 | Discharge: 2018-03-21 | Disposition: A | Discharge status: Home / Self Care | Payer: BLUE CROSS/BLUE SHIELD | Attending: Emergency Medicine | Admitting: Emergency Medicine

## 2018-03-21 DIAGNOSIS — I2510 Atherosclerotic heart disease of native coronary artery without angina pectoris: Secondary | ICD-10-CM | POA: Insufficient documentation

## 2018-03-21 DIAGNOSIS — Z7901 Long term (current) use of anticoagulants: Secondary | ICD-10-CM | POA: Insufficient documentation

## 2018-03-21 DIAGNOSIS — K029 Dental caries, unspecified: Secondary | ICD-10-CM | POA: Insufficient documentation

## 2018-03-21 DIAGNOSIS — K0889 Other specified disorders of teeth and supporting structures: Secondary | ICD-10-CM

## 2018-03-21 DIAGNOSIS — F17210 Nicotine dependence, cigarettes, uncomplicated: Secondary | ICD-10-CM | POA: Diagnosis not present

## 2018-03-21 DIAGNOSIS — Z7982 Long term (current) use of aspirin: Secondary | ICD-10-CM | POA: Diagnosis not present

## 2018-03-21 DIAGNOSIS — I480 Paroxysmal atrial fibrillation: Secondary | ICD-10-CM | POA: Diagnosis not present

## 2018-03-21 DIAGNOSIS — Z79899 Other long term (current) drug therapy: Secondary | ICD-10-CM | POA: Insufficient documentation

## 2018-03-21 DIAGNOSIS — I10 Essential (primary) hypertension: Secondary | ICD-10-CM | POA: Insufficient documentation

## 2018-03-21 MED ORDER — KETOROLAC TROMETHAMINE 10 MG PO TABS: 10.0000 mg | ORAL_TABLET | Freq: Once | ORAL | Status: DC

## 2018-03-21 MED ORDER — LIDOCAINE VISCOUS HCL 2 % MT SOLN
15.0000 mL | Freq: Once | OROMUCOSAL | Status: AC
  Administered 2018-03-21: 15 mL via OROMUCOSAL
  Filled 2018-03-21: qty 15

## 2018-03-21 MED ORDER — TRAMADOL HCL 50 MG PO TABS: 50.0000 mg | ORAL_TABLET | Freq: Four times a day (QID) | ORAL | 0 refills | Status: AC | PRN

## 2018-03-21 MED ORDER — TRAMADOL HCL 50 MG PO TABS
50.0000 mg | ORAL_TABLET | Freq: Once | ORAL | Status: AC
  Administered 2018-03-21: 50 mg via ORAL
  Filled 2018-03-21: qty 1

## 2018-03-21 MED ORDER — IBUPROFEN 800 MG PO TABS: 800.0000 mg | ORAL_TABLET | Freq: Three times a day (TID) | ORAL | 0 refills | Status: DC | PRN

## 2018-03-21 MED ORDER — AMOXICILLIN 500 MG PO CAPS: 500.0000 mg | ORAL_CAPSULE | Freq: Three times a day (TID) | ORAL | 0 refills | Status: AC

## 2018-03-21 NOTE — ED Triage Notes (Signed)
Pt arrives POV and ambulatory to triage with c/o right sided left lower dental pain that "woke me up outa my sleep". Pt is in NAD.

## 2018-03-21 NOTE — ED Provider Notes (Signed)
[facility] Emergency Department Provider Note   First MD Initiated Contact with Patient 03/21/18 [phone]     (approximate)  I have reviewed the triage vital signs and the nursing notes.   HISTORY  Chief Complaint Dental Pain    HPI Darren Rogers is a 62 y.o. male presents to the emergency department with acute onset of right mandibular molar pain which awoke the patient from sleep this morning.  Patient denies any fever no difficulty swallowing.  Patient does admit to multiple dental caries.  Patient states current pain score is 10 out of 10.  Past Medical History:  Diagnosis Date  . Claudication (HCC)    a. 06/2015 ABI: R - 0.73, L - 0.73. 30-49% bilat SFA stenosis. 50-74% R Profunda stenosis.  . Erectile dysfunction   . Essential hypertension   . History of echocardiogram    a. 03/29/2014: EF 55-60%, mild LVH, normal RVSP;  b. 05/2015 Echo: EF 60-65%, no rwma, triv AI, mild MR, mildly dil LA.  Marland Kitchen Hyperlipidemia   . Non-obstructive CAD    a. 01/27/2014 Cath: LM nl, LAD mild diff dz w/o obs, LCx no sig obs - scattered 20-30% mLCx, RCA no obs dz, EF 70%; b. 06/2015 MV; EF 60%, no nischemia.  Marland Kitchen PAF (paroxysmal atrial fibrillation) (HCC)    a. Pt says Dx >73yrs ago w/ ? RFCA @ [facility];  b. Recurrent 12/2013;  b. Rx Flecainide and Xarelto-->Intermittent compliance.  . Tobacco abuse    a. ongoing - 1 ppd.    Patient Active Problem List   Diagnosis Date Noted  . A-fib (HCC) 01/15/2017  . Personal history of tobacco use, presenting hazards to health 08/17/2016  . BPH associated with nocturia 07/30/2016  . IFG (impaired fasting glucose) 07/30/2016  . Essential hypertension   . Claudication (HCC)   . 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 CAD in native artery 04/11/2014  . Paroxysmal atrial fibrillation (HCC)   . Tobacco abuse   . Hyperlipidemia   . Atrial fibrillation with rapid ventricular response (HCC) 01/27/2014    Past Surgical History:    Procedure Laterality Date  . CARDIAC CATHETERIZATION    . [facility]     [facility]  . LEFT HEART CATH Right 01/27/2014   Procedure: LEFT HEART CATH;  Surgeon: Micheline Chapman, MD;  Location: [facility] CATH LAB;  Service: Cardiovascular;  Laterality: Right;    Prior to Admission medications   Medication Sig Start Date End Date Taking? Authorizing Provider  amoxicillin (AMOXIL) 500 MG capsule Take 1 capsule (500 mg total) by mouth 3 (three) times daily for 10 days. 03/21/18 03/31/18  Darci Current, MD  aspirin EC 81 MG tablet Take 81 mg by mouth daily.    [provider]  flecainide (TAMBOCOR) 100 MG tablet Take 1 tablet (100 mg total) by mouth every 12 (twelve) hours. 08/19/17   Iran Ouch, MD  metoprolol tartrate (LOPRESSOR) 100 MG tablet Take 0.5 tablets (50 mg total) by mouth 2 (two) times daily. 12/21/17   Iran Ouch, MD  rivaroxaban (XARELTO) 20 MG TABS tablet Take 1 tablet (20 mg total) by mouth daily with supper. Patient not taking: Reported on 11/04/2017 04/30/17   Creig Hines, NP  rosuvastatin (CRESTOR) 20 MG tablet Take 1 tablet (20 mg total) by mouth daily. 08/04/17   Dunn, Raymon Mutton, PA-C  sildenafil (VIAGRA) 25 MG tablet Take 1 tablet (25 mg total) by mouth daily as needed for erectile dysfunction. 01/22/15  Iran Ouch, MD  traMADol (ULTRAM) 50 MG tablet Take 1 tablet (50 mg total) by mouth every 6 (six) hours as needed. 03/21/18 03/21/19  Darci Current, MD    Allergies No known drug allergies  Family History  Problem Relation Age of Onset  . Coronary artery disease Father 59  . Diabetes Mother   . Diabetes Sister   . Diabetes Brother   . Diabetes Maternal Grandmother   . Diabetes Sister   . Diabetes Sister     Social History Social History   Tobacco Use  . Smoking status: Current Every Day Smoker    Packs/day: 0.50    Years: 30.00    Pack years: 15.00    Types: Cigarettes  . Smokeless tobacco: Never Used   Substance Use Topics  . Alcohol use: No  . Drug use: No    Review of Systems Constitutional: No fever/chills Eyes: No visual changes. ENT: No sore throat.  Positive for dental pain Cardiovascular: Denies chest pain. Respiratory: Denies shortness of breath. Gastrointestinal: No abdominal pain.  No nausea, no vomiting.  No diarrhea.  No constipation. Genitourinary: Negative for dysuria. Musculoskeletal: Negative for neck pain.  Negative for back pain. Integumentary: Negative for rash. Neurological: Negative for headaches, focal weakness or numbness.  ____________________________________________   PHYSICAL EXAM:  VITAL SIGNS: ED Triage Vitals  Enc Vitals Group     BP 03/21/18 0505 (!) 146/95     Pulse Rate 03/21/18 0505 77     Resp 03/21/18 0505 18     Temp 03/21/18 0505 98.1 F (36.7 C)     Temp Source 03/21/18 0505 Oral     SpO2 03/21/18 0505 97 %     Weight 03/21/18 0504 77.1 kg (170 lb)     Height 03/21/18 0504 1.676 m (5\' 6" )     Head Circumference --      Peak Flow --      Pain Score 03/21/18 0504 9     Pain Loc --      Pain Edu? --      Excl. in GC? --     Constitutional: Alert and oriented. Well appearing and in no acute distress. Eyes: Conjunctivae are normal.  Mouth/Throat: Mucous membranes are moist.  Oropharynx non-erythematous.  Multiple dental caries.  No abscess noted Neck: No stridor.  No cervical lymphadenopathy Cardiovascular: Normal rate, regular rhythm. Good peripheral circulation. Grossly normal heart sounds. Respiratory: Normal respiratory effort.  No retractions. Lungs CTAB. Skin:  Skin is warm, dry and intact. No rash noted. Psychiatric: Mood and affect are normal. Speech and behavior are normal.  ____________________________________________    Procedures   ____________________________________________   INITIAL IMPRESSION / ASSESSMENT AND PLAN / ED COURSE  As part of my medical decision making, I reviewed the following data within  the electronic MEDICAL RECORD NUMBER   62 year old male presented with above-stated history and physical exam consistent with dental caries/dental pain.  Patient given viscous lidocaine swish and spit and tramadol  with resolution of pain. ____________________________________________  FINAL CLINICAL IMPRESSION(S) / ED DIAGNOSES  Final diagnoses:  Pain, dental  Dental caries     MEDICATIONS GIVEN DURING THIS VISIT:  Medications  lidocaine (XYLOCAINE) 2 % viscous mouth solution 15 mL (15 mLs Mouth/Throat Given 03/21/18 0543)  traMADol (ULTRAM) tablet 50 mg (50 mg Oral Given 03/21/18 0543)     ED Discharge Orders         Ordered    ibuprofen (ADVIL,MOTRIN) 800 MG tablet  Every  8 hours PRN,   Status:  Discontinued     03/21/18 0529    amoxicillin (AMOXIL) 500 MG capsule  3 times daily     03/21/18 0529    traMADol (ULTRAM) 50 MG tablet  Every 6 hours PRN     03/21/18 0531           Note:  This document was prepared using Dragon voice recognition software and may include unintentional dictation errors.    Darci Current, MD 03/21/18 [id number]

## 2018-04-07 ENCOUNTER — Other Ambulatory Visit: Payer: Self-pay | Admitting: *Deleted

## 2018-04-07 MED ORDER — FLECAINIDE ACETATE 100 MG PO TABS: 100.0000 mg | ORAL_TABLET | Freq: Two times a day (BID) | ORAL | 0 refills | Status: DC

## 2018-06-10 ENCOUNTER — Telehealth: Payer: Self-pay

## 2018-06-10 MED ORDER — FLECAINIDE ACETATE 100 MG PO TABS: 100.0000 mg | ORAL_TABLET | Freq: Two times a day (BID) | ORAL | 0 refills | Status: DC

## 2018-06-10 NOTE — Telephone Encounter (Signed)
Flecainide refill sent to [facility].

## 2018-06-22 ENCOUNTER — Ambulatory Visit: Payer: Self-pay | Admitting: Family Medicine

## 2018-09-28 IMAGING — DX DG CHEST 1V PORT
1 series · 1 of 1 positions shown · non-contrast
Comparison: Portable exam 5181 hours compared to 08/23/2015

CLINICAL DATA: Chest pain today, atrial fibrillation

EXAM:
PORTABLE CHEST 1 VIEW

[chest ap]
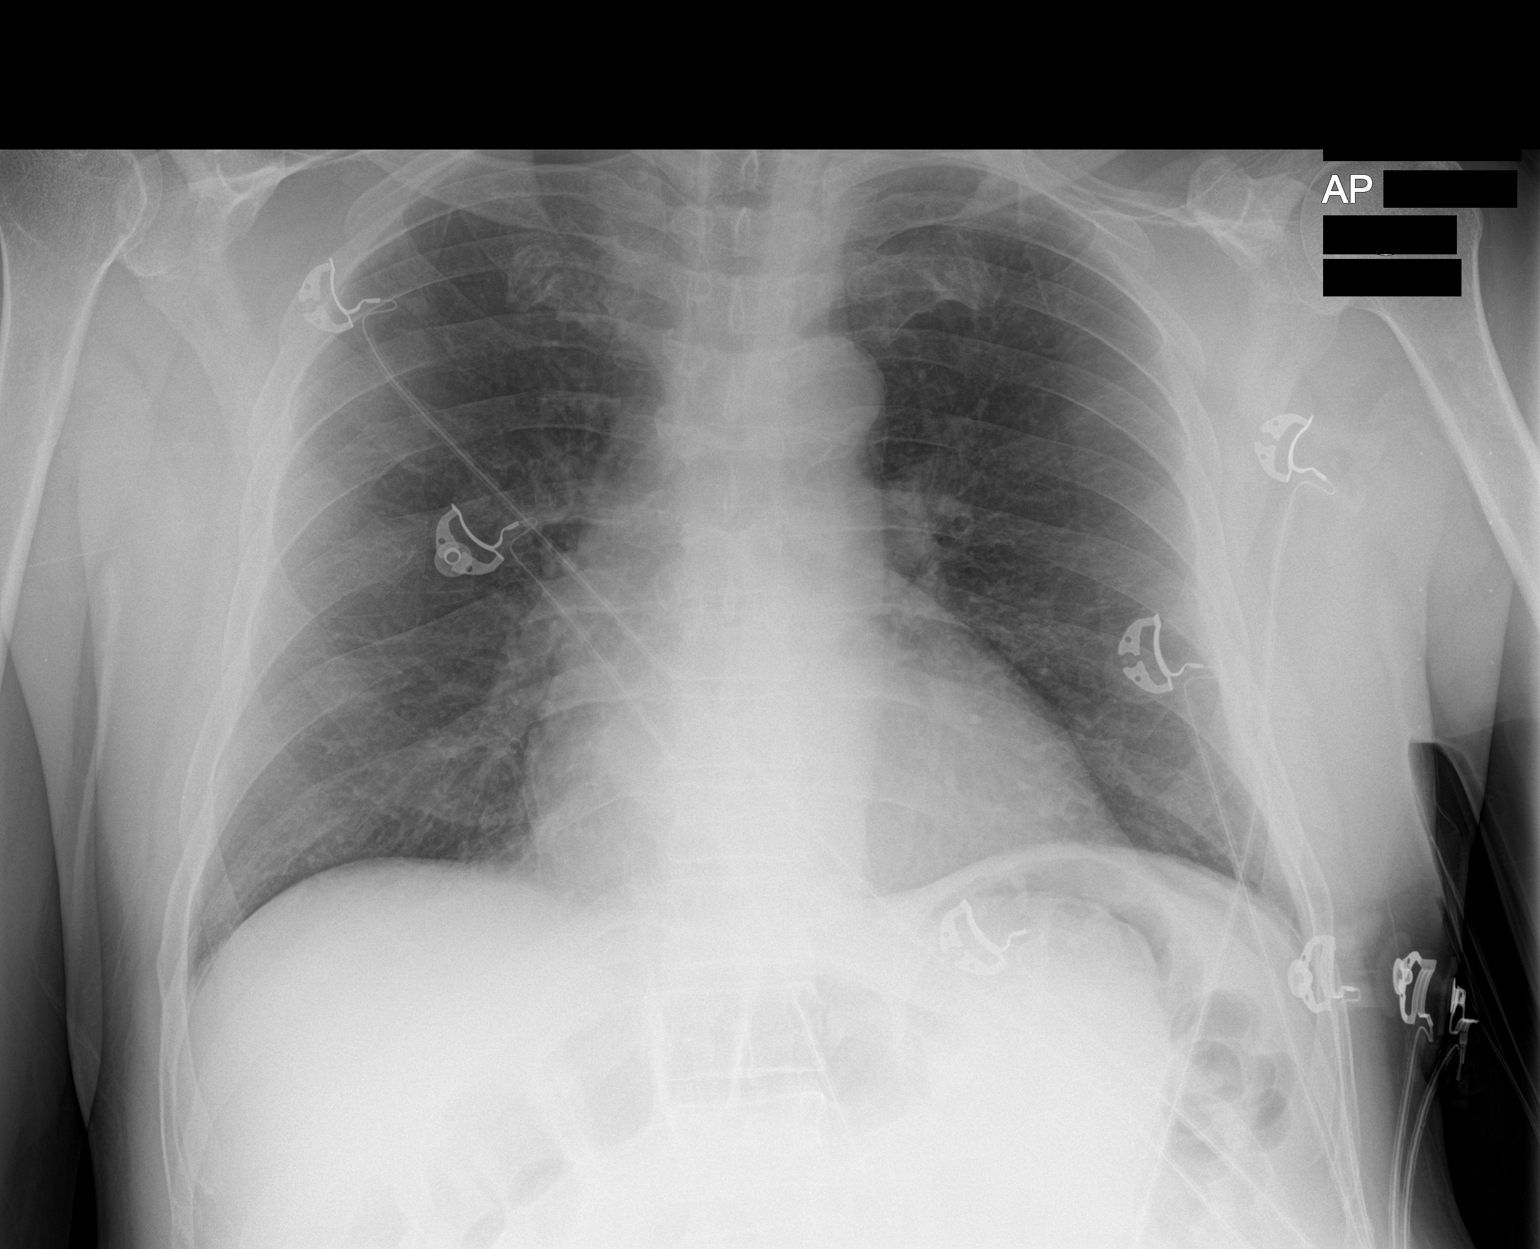

[1 of 1 positions shown; findings below may reference images not displayed]

FINDINGS: Normal heart size, mediastinal contours and pulmonary vascularity.

Atherosclerotic calcification aorta.

Lungs clear.

No pulmonary infiltrate, pleural effusion or pneumothorax.

Bones demineralized.
IMPRESSION: No acute abnormalities.

## 2019-02-02 ENCOUNTER — Ambulatory Visit: Payer: BLUE CROSS/BLUE SHIELD | Admitting: Nurse Practitioner

## 2019-02-02 ENCOUNTER — Encounter: Payer: Self-pay | Admitting: Nurse Practitioner

## 2019-02-02 NOTE — Progress Notes (Deleted)
Office Visit    Patient Name: Darren Rogers Date of Encounter: 02/02/2019  Primary Care Provider:  Dorcas Carrow, DO Primary Cardiologist:  Lorine Bears, MD  Chief Complaint    63 year old male with a history of paroxysmal atrial fibrillation, nonobstructive CAD, peripheral arterial disease, hypertension, hyperlipidemia, erectile dysfunction, and tobacco abuse, who presents for follow-up of all of the above.  Past Medical History    Past Medical History:  Diagnosis Date  . Claudication (HCC)    a. 06/2015 ABI: R - 0.73, L - 0.73. 30-49% bilat SFA stenosis. 50-74% R Profunda stenosis; b. 01/2017 ABI: R 0.89, L 0.88, TBI R 0.65, L 1.0.  . Erectile dysfunction   . Essential hypertension   . History of echocardiogram    a. 03/29/2014: EF 55-60%, mild LVH, normal RVSP;  b. 05/2015 Echo: EF 60-65%, no rwma, triv AI, mild MR, mildly dil LA.  Marland Kitchen Hyperlipidemia   . Non-obstructive CAD    a. 01/27/2014 Cath: LM nl, LAD mild diff dz w/o obs, LCx no sig obs - scattered 20-30% mLCx, RCA no obs dz, EF 70%; b. 06/2015 MV; EF 60%, no nischemia.  Marland Kitchen PAF (paroxysmal atrial fibrillation) (HCC)    a. Pt says Dx >18yrs ago w/ ? RFCA @ [facility];  b. Recurrent 12/2013;  b. Rx Flecainide and Xarelto-->Intermittent compliance.  Marland Kitchen PVC's (premature ventricular contractions)    a. 05/2017 Zio: 4000 PVCs in 48 hrs (2%). Brief run of SVT.  . Tobacco abuse    a. ongoing - 1 ppd.   Past Surgical History:  Procedure Laterality Date  . CARDIAC CATHETERIZATION    . CARD[facility]AC CATHETERIZATION     [facility]  . LEFT HEART CATH Right 01/27/2014   Procedure: LEFT HEART CATH;  Surgeon: Micheline Chapman, MD;  Location: [facility] CATH LAB;  Service: Cardiovascular;  Laterality: Right;    Allergies  No Known Allergies  History of Present Illness    63 year old male with a history of paroxysmal atrial fibrillation, nonobstructive CAD, hypertension, hyperlipidemia, erectile dysfunction, claudication, and tobacco abuse.  He was  diagnosed with atrial fibrillation greater than 20 years ago but more recently had recurrent atrial fibrillation in August 2015 and was treated with flecainide and Xarelto.  He underwent diagnostic catheterization 2015 the setting of chest pain during atrial fibrillation.  This showed nonobstructive disease.  He had recurrent chest pain and A. fib in early 2017 with a nonischemic Myoview at that time.  He also has a history of lower extremity claudication with ABIs previously suggesting bilateral SFA and right profunda disease.  In August 2018, he was admitted to [facility] in the setting of noncompliance with flecainide and recurrent A. fib.  Flecainide, metoprolol, and Xarelto were reinitiated and he converted to sinus rhythm.  Though he has been advised to undergo treadmill testing in the setting of flecainide therapy, he has not been compliant in follow-up for that.  A Zio monitor in December 2018 was performed in the setting of palpitations which was notable for PVCs (4000 in 48 hours representing a 2% burden).  He was last seen in clinic in June 2019 following an episode of A. fib with RVR requiring ER visit in February 2019.  It was identified that this was most likely secondary to noncompliance with flecainide and metoprolol.  Since that ER visit however, he had been doing relatively well with stable, mild claudication (walking 1000 feet).  Home Medications    Prior to Admission medications   Medication  Sig Start Date End Date Taking? Authorizing Provider  aspirin EC 81 MG tablet Take 81 mg by mouth daily.    [provider]  flecainide (TAMBOCOR) 100 MG tablet Take 1 tablet (100 mg total) by mouth every 12 (twelve) hours. 06/10/18   Wellington Hampshire, MD  metoprolol tartrate (LOPRESSOR) 100 MG tablet Take 0.5 tablets (50 mg total) by mouth 2 (two) times daily. 12/21/17   Wellington Hampshire, MD  rivaroxaban (XARELTO) 20 MG TABS tablet Take 1 tablet (20 mg total) by mouth daily with  supper. Patient not taking: Reported on 11/04/2017 04/30/17   Theora Gianotti, NP  rosuvastatin (CRESTOR) 20 MG tablet Take 1 tablet (20 mg total) by mouth daily. 08/04/17   Dunn, Areta Haber, PA-C  sildenafil (VIAGRA) 25 MG tablet Take 1 tablet (25 mg total) by mouth daily as needed for erectile dysfunction. 01/22/15   Wellington Hampshire, MD  traMADol (ULTRAM) 50 MG tablet Take 1 tablet (50 mg total) by mouth every 6 (six) hours as needed. 03/21/18 03/21/19  Gregor Hams, MD    Review of Systems    ***.  All other systems reviewed and are otherwise negative except as noted above.  Physical Exam    VS:  There were no vitals taken for this visit. , BMI There is no height or weight on file to calculate BMI. GEN: Well nourished, well developed, in no acute distress. HEENT: normal. Neck: Supple, no JVD, carotid bruits, or masses. Cardiac: RRR, no murmurs, rubs, or gallops. No clubbing, cyanosis, edema.  Radials/DP/PT 2+ and equal bilaterally.  Respiratory:  Respirations regular and unlabored, clear to auscultation bilaterally. GI: Soft, nontender, nondistended, BS + x 4. MS: no deformity or atrophy. Skin: warm and dry, no rash. Neuro:  Strength and sensation are intact. Psych: Normal affect.  Accessory Clinical Findings    ECG personally reviewed by me today - *** - no acute changes.  Lab Results  Component Value Date   WBC 6.3 08/04/2017   HGB 13.6 08/04/2017   HCT 41.6 08/04/2017   MCV 93 08/04/2017   PLT 265 08/04/2017   Lab Results  Component Value Date   CREATININE 1.16 08/04/2017   BUN 18 08/04/2017   NA 144 08/04/2017   K 4.2 08/04/2017   CL 106 08/04/2017   CO2 25 08/04/2017   Lab Results  Component Value Date   ALT 21 11/04/2017   AST 23 11/04/2017   ALKPHOS 69 11/04/2017   BILITOT 0.3 11/04/2017   Lab Results  Component Value Date   CHOL 151 11/04/2017   HDL 42 11/04/2017   LDLCALC 86 11/04/2017   TRIG 115 11/04/2017   CHOLHDL 3.6 11/04/2017      Assessment & Plan    1.  ***   Murray Hodgkins, NP 02/02/2019, 8:29 AM

## 2019-05-04 ENCOUNTER — Other Ambulatory Visit: Payer: Self-pay

## 2019-05-04 ENCOUNTER — Emergency Department
Admission: EM | Admit: 2019-05-04 | Discharge: 2019-05-05 | Disposition: A | Discharge status: Home / Self Care | Payer: Self-pay | Attending: Emergency Medicine | Admitting: Emergency Medicine

## 2019-05-04 DIAGNOSIS — Y907 Blood alcohol level of 200-239 mg/100 ml: Secondary | ICD-10-CM | POA: Insufficient documentation

## 2019-05-04 DIAGNOSIS — Z79899 Other long term (current) drug therapy: Secondary | ICD-10-CM | POA: Insufficient documentation

## 2019-05-04 DIAGNOSIS — F1010 Alcohol abuse, uncomplicated: Secondary | ICD-10-CM | POA: Insufficient documentation

## 2019-05-04 DIAGNOSIS — F17210 Nicotine dependence, cigarettes, uncomplicated: Secondary | ICD-10-CM | POA: Insufficient documentation

## 2019-05-04 DIAGNOSIS — Z7982 Long term (current) use of aspirin: Secondary | ICD-10-CM | POA: Insufficient documentation

## 2019-05-04 LAB — SALICYLATE LEVEL: Salicylate Lvl: <7 mg/dL (ref 2.8–30.0)

## 2019-05-04 LAB — COMPREHENSIVE METABOLIC PANEL
ALT: 23 U/L (ref 0–44)
AST: 38 U/L (ref 15–41)
Albumin: 4.1 g/dL (ref 3.5–5.0)
Alkaline Phosphatase: 80 U/L (ref 38–126)
Anion gap: 10 (ref 5–15)
BUN: 13 mg/dL (ref 8–23)
CO2: 26 mmol/L (ref 22–32)
Calcium: 8.9 mg/dL (ref 8.9–10.3)
Chloride: 104 mmol/L (ref 98–111)
Creatinine, Ser: 1.04 mg/dL (ref 0.61–1.24)
GFR calc Af Amer: >60 mL/min (ref >60)
GFR calc non Af Amer: >60 mL/min (ref >60)
Glucose, Bld: 100 mg/dL — ABNORMAL HIGH (ref 70–99)
Potassium: 3.6 mmol/L (ref 3.5–5.1)
Sodium: 140 mmol/L (ref 135–145)
Total Bilirubin: 0.4 mg/dL (ref 0.3–1.2)
Total Protein: 7.3 g/dL (ref 6.5–8.1)

## 2019-05-04 LAB — URINE DRUG SCREEN, QUALITATIVE (ARMC ONLY)
Amphetamines, Ur Screen: NOT DETECTED
Barbiturates, Ur Screen: NOT DETECTED
Benzodiazepine, Ur Scrn: NOT DETECTED
Cannabinoid 50 Ng, Ur ~~LOC~~: NOT DETECTED
Cocaine Metabolite,Ur ~~LOC~~: NOT DETECTED
MDMA (Ecstasy)Ur Screen: NOT DETECTED
Methadone Scn, Ur: NOT DETECTED
Opiate, Ur Screen: NOT DETECTED
Phencyclidine (PCP) Ur S: NOT DETECTED
Tricyclic, Ur Screen: NOT DETECTED

## 2019-05-04 LAB — ACETAMINOPHEN LEVEL: Acetaminophen (Tylenol), Serum: <10 ug/mL — ABNORMAL LOW (ref 10–30)

## 2019-05-04 LAB — ETHANOL: Alcohol, Ethyl (B): 221 mg/dL — ABNORMAL HIGH (ref <10)

## 2019-05-05 LAB — CBC
HCT: 45.3 % (ref 39.0–52.0)
Hemoglobin: 15.2 g/dL (ref 13.0–17.0)
MCH: 31.3 pg (ref 26.0–34.0)
MCHC: 33.6 g/dL (ref 30.0–36.0)
MCV: 93.2 fL (ref 80.0–100.0)
Platelets: 267 10*3/uL (ref 150–400)
RBC: 4.86 MIL/uL (ref 4.22–5.81)
RDW: 16.9 % — ABNORMAL HIGH (ref 11.5–15.5)
WBC: 8.2 10*3/uL (ref 4.0–10.5)
nRBC: 0 % (ref 0.0–0.2)

## 2019-05-05 MED ORDER — METOPROLOL TARTRATE 50 MG PO TABS
100.0000 mg | ORAL_TABLET | Freq: Once | ORAL | Status: AC
  Administered 2019-05-05: 100 mg via ORAL
  Filled 2019-05-05: qty 2

## 2019-05-05 MED ORDER — METOPROLOL TARTRATE 100 MG PO TABS: 50.0000 mg | ORAL_TABLET | Freq: Two times a day (BID) | ORAL | 0 refills | Status: DC

## 2019-05-05 MED ORDER — ASPIRIN EC 81 MG PO TBEC: 81.0000 mg | DELAYED_RELEASE_TABLET | Freq: Every day | ORAL | 0 refills | Status: DC

## 2019-05-05 MED ORDER — ROSUVASTATIN CALCIUM 20 MG PO TABS: 20.0000 mg | ORAL_TABLET | Freq: Every day | ORAL | 0 refills | Status: DC

## 2019-05-05 NOTE — BH Assessment (Signed)
Pt has been accepted to [facility] and should be contacted when pt's d/c paperwork is complete so someone can come pick him up from the hospital.

## 2019-05-05 NOTE — ED Notes (Signed)
Called [facility], they are sending transportation for patient. Will send with discharge papers and prescriptions. Patient agreeable to transport

## 2019-05-05 NOTE — ED Notes (Signed)
Patient to be transferred to detox facility tonight

## 2019-05-05 NOTE — ED Notes (Signed)
Patient wheeled to lobby where he will wait for [facility] to pick him up. First nurse aware

## 2019-05-05 NOTE — ED Provider Notes (Signed)
[facility] Emergency Department Provider Note ____   First MD Initiated Contact with Patient 05/05/19 0021     (approximate)  I have reviewed the triage vital signs and the nursing notes.   HISTORY  Chief Complaint Drug / Alcohol Assessment    HPI Darren Rogers is a 63 y.o. male with below list of previous medical conditions presents to the emergency department secondary to request for alcohol detox.  Patient denies any suicidal or homicidal ideation at present.  Patient states that he did have an episode few days ago where he had thoughts of hurting himself.  However the patient states at present "I would never do that".        Past Medical History:  Diagnosis Date  . Claudication (HCC)    a. 06/2015 ABI: R - 0.73, L - 0.73. 30-49% bilat SFA stenosis. 50-74% R Profunda stenosis; b. 01/2017 ABI: R 0.89, L 0.88, TBI R 0.65, L 1.0.  . Erectile dysfunction   . Essential hypertension   . History of echocardiogram    a. 03/29/2014: EF 55-60%, mild LVH, normal RVSP;  b. 05/2015 Echo: EF 60-65%, no rwma, triv AI, mild MR, mildly dil LA.  Marland Kitchen Hyperlipidemia   . Non-obstructive CAD    a. 01/27/2014 Cath: LM nl, LAD mild diff dz w/o obs, LCx no sig obs - scattered 20-30% mLCx, RCA no obs dz, EF 70%; b. 06/2015 MV; EF 60%, no nischemia.  Marland Kitchen PAF (paroxysmal atrial fibrillation) (HCC)    a. Pt says Dx >48yrs ago w/ ? RFCA @ [facility];  b. Recurrent 12/2013;  b. Rx Flecainide and Xarelto-->Intermittent compliance.  Marland Kitchen PVC's (premature ventricular contractions)    a. 05/2017 Zio: 4000 PVCs in 48 hrs (2%). Brief run of SVT.  . Tobacco abuse    a. ongoing - 1 ppd.    Patient Active Problem List   Diagnosis Date Noted  . A-fib (HCC) 01/15/2017  . Personal history of tobacco use, presenting hazards to health 08/17/2016  . BPH associated with nocturia 07/30/2016  . IFG (impaired fasting glucose) 07/30/2016  . Essential hypertension   . Claudication (HCC)   . Drug-induced  erectile dysfunction 01/22/2015  . CAD in native artery 04/11/2014  . Paroxysmal atrial fibrillation (HCC)   . Tobacco abuse   . Hyperlipidemia   . Atrial fibrillation with rapid ventricular response (HCC) 01/27/2014    Past Surgical History:  Procedure Laterality Date  . CARDIAC CATHETERIZATION    . [facility]ATION     [facility]  . LEFT HEART CATH Right 01/27/2014   Procedure: LEFT HEART CATH;  Surgeon: Micheline Chapman, MD;  Location: [facility] CATH LAB;  Service: Cardiovascular;  Laterality: Right;    Prior to Admission medications   Medication Sig Start Date End Date Taking? Authorizing Provider  aspirin EC 81 MG tablet Take 81 mg by mouth daily.    [provider]  flecainide (TAMBOCOR) 100 MG tablet Take 1 tablet (100 mg total) by mouth every 12 (twelve) hours. 06/10/18   Iran Ouch, MD  metoprolol tartrate (LOPRESSOR) 100 MG tablet Take 0.5 tablets (50 mg total) by mouth 2 (two) times daily. 12/21/17   Iran Ouch, MD  rivaroxaban (XARELTO) 20 MG TABS tablet Take 1 tablet (20 mg total) by mouth daily with supper. Patient not taking: Reported on 11/04/2017 04/30/17   Creig Hines, NP  rosuvastatin (CRESTOR) 20 MG tablet Take 1 tablet (20 mg total) by mouth daily. 08/04/17  Rise Mu, PA-C  sildenafil (VIAGRA) 25 MG tablet Take 1 tablet (25 mg total) by mouth daily as needed for erectile dysfunction. 01/22/15   Wellington Hampshire, MD    Allergies Patient has no known allergies.  Family History  Problem Relation Age of Onset  . Coronary artery disease Father 34  . Diabetes Mother   . Diabetes Sister   . Diabetes Brother   . Diabetes Maternal Grandmother   . Diabetes Sister   . Diabetes Sister     Social History Social History   Tobacco Use  . Smoking status: Current Every Day Smoker    Packs/day: 0.50    Years: 30.00    Pack years: 15.00    Types: Cigarettes  . Smokeless tobacco: Never Used  Substance Use Topics  . Alcohol use: Yes   . Drug use: No    Review of Systems Constitutional: No fever/chills Eyes: No visual changes. ENT: No sore throat. Cardiovascular: Denies chest pain. Respiratory: Denies shortness of breath. Gastrointestinal: No abdominal pain.  No nausea, no vomiting.  No diarrhea.  No constipation. Genitourinary: Negative for dysuria. Musculoskeletal: Negative for neck pain.  Negative for back pain. Integumentary: Negative for rash. Neurological: Negative for headaches, focal weakness or numbness. Psychiatric:  Positive for alcohol abuse ____________________________________________   PHYSICAL EXAM:  VITAL SIGNS: ED Triage Vitals  Enc Vitals Group     BP 05/04/19 2211 (!) 142/85     Pulse Rate 05/04/19 2211 99     Resp 05/04/19 2211 18     Temp 05/04/19 2211 98.7 F (37.1 C)     Temp Source 05/04/19 2211 Oral     SpO2 05/04/19 2211 99 %     Weight 05/04/19 2212 68 kg (150 lb)     Height 05/04/19 2212 1.702 m (5\' 7" )     Head Circumference --      Peak Flow --      Pain Score 05/04/19 2211 0     Pain Loc --      Pain Edu? --      Excl. in [place]? --     Constitutional: Alert and oriented.  Appears intoxicated  eyes: Conjunctivae are normal.  Head: Atraumatic. Mouth/Throat: Patient is wearing a mask. Neck: No stridor.  No meningeal signs.   Cardiovascular: Normal rate, regular rhythm. Good peripheral circulation. Grossly normal heart sounds. Respiratory: Normal respiratory effort.  No retractions. Gastrointestinal: Soft and nontender. No distention.  Musculoskeletal: No lower extremity tenderness nor edema. No gross deformities of extremities. Neurologic:  Normal speech and language. No gross focal neurologic deficits are appreciated.  Skin:  Skin is warm, dry and intact. Psychiatric: Mood and affect are normal. Speech and behavior are normal.  ____________________________________________   LABS (all labs ordered are listed, but only abnormal results are displayed)  Labs Reviewed   COMPREHENSIVE METABOLIC PANEL - Abnormal; Notable for the following components:      Result Value   Glucose, Bld 100 (*)    All other components within normal limits  ETHANOL - Abnormal; Notable for the following components:   Alcohol, Ethyl (B) 221 (*)    All other components within normal limits  ACETAMINOPHEN LEVEL - Abnormal; Notable for the following components:   Acetaminophen (Tylenol), Serum <10 (*)    All other components within normal limits  CBC - Abnormal; Notable for the following components:   RDW 16.9 (*)    All other components within normal limits  SALICYLATE LEVEL  URINE DRUG  SCREEN, QUALITATIVE ([facility] ONLY)   ____________________________________________  EKG  ED ECG REPORT I, [place] BROWN, the attending physician, personally viewed and interpreted this ECG.   Date: 05/05/2019  EKG Time: 12:42 AM  Rate: 89  Rhythm: Normal sinus rhythm left ventricular hypertrophy  Axis: Normal  Intervals: Normal  ST&T Change: None    Procedures   ____________________________________________   INITIAL IMPRESSION / MDM / ASSESSMENT AND PLAN / ED COURSE  As part of my medical decision making, I reviewed the following data within the electronic MEDICAL RECORD NUMBER   63 year old male presenting with above-stated history and physical exam secondary to request for alcohol detox.  Patient accepted at [facility]      ____________________________________________  FINAL CLINICAL IMPRESSION(S) / ED DIAGNOSES  Final diagnoses:  Alcohol abuse     MEDICATIONS GIVEN DURING THIS VISIT:  Medications - No data to display   ED Discharge Orders    None      *Please note:  Darren Rogers was evaluated in Emergency Department on 05/05/2019 for the symptoms described in the history of present illness. He was evaluated in the context of the global COVID-19 pandemic, which necessitated consideration that the patient might be at risk for infection with the SARS-CoV-2 virus that causes  COVID-19. Institutional protocols and algorithms that pertain to the evaluation of patients at risk for COVID-19 are in a state of rapid change based on information released by regulatory bodies including the CDC and federal and state organizations. These policies and algorithms were followed during the patient's care in the ED.  Some ED evaluations and interventions may be delayed as a result of limited staffing during the pandemic.*  Note:  This document was prepared using Dragon voice recognition software and may include unintentional dictation errors.   Darci Current, MD 05/05/19 [phone]

## 2019-05-05 NOTE — ED Notes (Addendum)
Patient dressed out for [facility]- dressed in red scrubs, and socks  2 shirts, 1 tank top 1 pair black jeans 1 tan hat 2 pairs socks 1 wallet clip 1 silver watch 2 silver rings 1 cell phone and cell clip

## 2019-05-05 NOTE — ED Notes (Signed)
Returned all belongings to patient

## 2019-08-16 ENCOUNTER — Other Ambulatory Visit: Payer: Self-pay

## 2019-08-16 ENCOUNTER — Emergency Department: Payer: Self-pay

## 2019-08-16 ENCOUNTER — Encounter: Payer: Self-pay | Admitting: Emergency Medicine

## 2019-08-16 ENCOUNTER — Emergency Department
Admission: EM | Admit: 2019-08-16 | Discharge: 2019-08-16 | Disposition: A | Discharge status: Home / Self Care | Payer: Self-pay | Attending: Emergency Medicine | Admitting: Emergency Medicine

## 2019-08-16 DIAGNOSIS — I2510 Atherosclerotic heart disease of native coronary artery without angina pectoris: Secondary | ICD-10-CM | POA: Insufficient documentation

## 2019-08-16 DIAGNOSIS — I4821 Permanent atrial fibrillation: Secondary | ICD-10-CM | POA: Insufficient documentation

## 2019-08-16 DIAGNOSIS — I10 Essential (primary) hypertension: Secondary | ICD-10-CM | POA: Insufficient documentation

## 2019-08-16 DIAGNOSIS — Z7901 Long term (current) use of anticoagulants: Secondary | ICD-10-CM | POA: Insufficient documentation

## 2019-08-16 DIAGNOSIS — R079 Chest pain, unspecified: Secondary | ICD-10-CM

## 2019-08-16 DIAGNOSIS — F17210 Nicotine dependence, cigarettes, uncomplicated: Secondary | ICD-10-CM | POA: Insufficient documentation

## 2019-08-16 LAB — CBC
HCT: 44.2 % (ref 39.0–52.0)
Hemoglobin: 15.2 g/dL (ref 13.0–17.0)
MCH: 32.1 pg (ref 26.0–34.0)
MCHC: 34.4 g/dL (ref 30.0–36.0)
MCV: 93.2 fL (ref 80.0–100.0)
Platelets: 251 10*3/uL (ref 150–400)
RBC: 4.74 MIL/uL (ref 4.22–5.81)
RDW: 15.7 % — ABNORMAL HIGH (ref 11.5–15.5)
WBC: 5.7 10*3/uL (ref 4.0–10.5)
nRBC: 0 % (ref 0.0–0.2)

## 2019-08-16 LAB — COMPREHENSIVE METABOLIC PANEL
ALT: 18 U/L (ref 0–44)
AST: 26 U/L (ref 15–41)
Albumin: 3.7 g/dL (ref 3.5–5.0)
Alkaline Phosphatase: 66 U/L (ref 38–126)
Anion gap: 9 (ref 5–15)
BUN: 15 mg/dL (ref 8–23)
CO2: 22 mmol/L (ref 22–32)
Calcium: 9.1 mg/dL (ref 8.9–10.3)
Chloride: 108 mmol/L (ref 98–111)
Creatinine, Ser: 0.96 mg/dL (ref 0.61–1.24)
GFR calc Af Amer: >60 mL/min (ref >60)
GFR calc non Af Amer: >60 mL/min (ref >60)
Glucose, Bld: 110 mg/dL — ABNORMAL HIGH (ref 70–99)
Potassium: 4 mmol/L (ref 3.5–5.1)
Sodium: 139 mmol/L (ref 135–145)
Total Bilirubin: 0.7 mg/dL (ref 0.3–1.2)
Total Protein: 6.7 g/dL (ref 6.5–8.1)

## 2019-08-16 LAB — TROPONIN I (HIGH SENSITIVITY)
Troponin I (High Sensitivity): 26 ng/L — ABNORMAL HIGH (ref <18)
Troponin I (High Sensitivity): 25 ng/L — ABNORMAL HIGH (ref <18)

## 2019-08-16 MED ORDER — FLECAINIDE ACETATE 100 MG PO TABS
100.0000 mg | ORAL_TABLET | Freq: Once | ORAL | Status: AC
  Administered 2019-08-16: 100 mg via ORAL
  Filled 2019-08-16: qty 1

## 2019-08-16 MED ORDER — METOPROLOL TARTRATE 50 MG PO TABS
100.0000 mg | ORAL_TABLET | Freq: Once | ORAL | Status: AC
  Administered 2019-08-16: 100 mg via ORAL
  Filled 2019-08-16: qty 2

## 2019-08-16 MED ORDER — RIVAROXABAN 20 MG PO TABS: 20.0000 mg | ORAL_TABLET | Freq: Every day | ORAL | 3 refills | Status: DC

## 2019-08-16 MED ORDER — ROSUVASTATIN CALCIUM 20 MG PO TABS: 20.0000 mg | ORAL_TABLET | Freq: Every day | ORAL | 1 refills | Status: DC

## 2019-08-16 MED ORDER — ASPIRIN 81 MG PO CHEW
324.0000 mg | CHEWABLE_TABLET | Freq: Once | ORAL | Status: AC
  Administered 2019-08-16: 324 mg via ORAL
  Filled 2019-08-16: qty 4

## 2019-08-16 MED ORDER — METOPROLOL TARTRATE 100 MG PO TABS: 50.0000 mg | ORAL_TABLET | Freq: Two times a day (BID) | ORAL | 1 refills | Status: DC

## 2019-08-16 MED ORDER — ASPIRIN EC 81 MG PO TBEC: 81.0000 mg | DELAYED_RELEASE_TABLET | Freq: Every day | ORAL | 1 refills | Status: DC

## 2019-08-16 MED ORDER — DILTIAZEM HCL 25 MG/5ML IV SOLN
10.0000 mg | Freq: Once | INTRAVENOUS | Status: AC
  Administered 2019-08-16: 10 mg via INTRAVENOUS
  Filled 2019-08-16: qty 5

## 2019-08-16 MED ORDER — FLECAINIDE ACETATE 100 MG PO TABS: 100.0000 mg | ORAL_TABLET | Freq: Two times a day (BID) | ORAL | 1 refills | Status: DC

## 2019-08-16 NOTE — ED Notes (Signed)
Pt provided with phone to call sister ride home. Pt provided with blue scrubs.

## 2019-08-16 NOTE — ED Notes (Signed)
Pt st "I ran out of metrolol and flacainide. 2 weeks ago". Pt st "I was on xarelto but I had nosebleeds and I quit it months ago".

## 2019-08-16 NOTE — ED Notes (Signed)
This RN Biomedical engineer on med  from pharmacy.

## 2019-08-16 NOTE — ED Notes (Signed)
ED Provider Williams at bedside. 

## 2019-08-16 NOTE — ED Notes (Signed)
Darren Rogers at bedside. Pt provided with water/saltines.

## 2019-08-16 NOTE — ED Notes (Signed)
Med verification sent to pharmacy.

## 2019-08-16 NOTE — ED Notes (Signed)
Pt unable to keep BP cuff on/pulse ox. This RN educated and advised pt to keep monitor equipment on. Pt agrees. This RN will continue to monitor pt.

## 2019-08-16 NOTE — ED Triage Notes (Addendum)
Pt from home via [facility]. Per EMS, pt on Afib with RVR at 220bpm. Given 5mg  metropolol in route resulting in HR 120-170's. Pt st lef-sided CP radiated to left shoulder started last night at 11pm "it woke me up" reports feeling nauseous and lightheaded.NAD noted upon arrival. EDP [place] at bedside.

## 2019-08-16 NOTE — ED Provider Notes (Signed)
[facility] Emergency Department Provider Note       Time seen: ----------------------------------------- 9:24 AM on 08/16/2019 -----------------------------------------   I have reviewed the triage vital signs and the nursing notes.  HISTORY  Chief Complaint No chief complaint on file.   HPI Darren Rogers is a 64 y.o. male with a history of hypertension, hyperlipidemia, paroxysmal atrial fibrillation who presents to the ED for chest pain.  Patient had ongoing chest pain since last night, has been out of his metoprolol for some time.  He was given metoprolol in route by EMS which took his pain from 10-0.  He denies any other complaints now.  Past Medical History:  Diagnosis Date  . Claudication (HCC)    a. 06/2015 ABI: R - 0.73, L - 0.73. 30-49% bilat SFA stenosis. 50-74% R Profunda stenosis; b. 01/2017 ABI: R 0.89, L 0.88, TBI R 0.65, L 1.0.  . Erectile dysfunction   . Essential hypertension   . History of echocardiogram    a. 03/29/2014: EF 55-60%, mild LVH, normal RVSP;  b. 05/2015 Echo: EF 60-65%, no rwma, triv AI, mild MR, mildly dil LA.  Marland Kitchen Hyperlipidemia   . Non-obstructive CAD    a. 01/27/2014 Cath: LM nl, LAD mild diff dz w/o obs, LCx no sig obs - scattered 20-30% mLCx, RCA no obs dz, EF 70%; b. 06/2015 MV; EF 60%, no nischemia.  Marland Kitchen PAF (paroxysmal atrial fibrillation) (HCC)    a. Pt says Dx >74yrs ago w/ ? RFCA @ [facility];  b. Recurrent 12/2013;  b. Rx Flecainide and Xarelto-->Intermittent compliance.  Marland Kitchen PVC's (premature ventricular contractions)    a. 05/2017 Zio: 4000 PVCs in 48 hrs (2%). Brief run of SVT.  . Tobacco abuse    a. ongoing - 1 ppd.    Patient Active Problem List   Diagnosis Date Noted  . A-fib (HCC) 01/15/2017  . Personal history of tobacco use, presenting hazards to health 08/17/2016  . BPH associated with nocturia 07/30/2016  . IFG (impaired fasting glucose) 07/30/2016  . Essential hypertension   . Claudication (HCC)   .  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 CAD in native artery 04/11/2014  . Paroxysmal atrial fibrillation (HCC)   . Tobacco abuse   . Hyperlipidemia   . Atrial fibrillation with rapid ventricular response (HCC) 01/27/2014    Past Surgical History:  Procedure Laterality Date  . CARDIAC CATHETERIZATION    . [facility]IZ[facility]     [facility]  . LEFT HEART CATH Right 01/27/2014   Procedure: LEFT HEART CATH;  Surgeon: Micheline Chapman, MD;  Location: [facility] CATH LAB;  Service: Cardiovascular;  Laterality: Right;    Allergies Patient has no known allergies.  Social History Social History   Tobacco Use  . Smoking status: Current Every Day Smoker    Packs/day: 0.50    Years: 30.00    Pack years: 15.00    Types: Cigarettes  . Smokeless tobacco: Never Used  Substance Use Topics  . Alcohol use: Yes  . Drug use: No    Review of Systems Constitutional: Negative for fever. Cardiovascular: Positive for chest pain, palpitations Respiratory: Negative for shortness of breath. Gastrointestinal: Negative for abdominal pain, vomiting and diarrhea. Musculoskeletal: Negative for back pain. Skin: Negative for rash. Neurological: Negative for headaches, focal weakness or numbness.  All systems negative/normal/unremarkable except as stated in the HPI  ____________________________________________   PHYSICAL EXAM:  VITAL SIGNS: ED Triage Vitals  Enc Vitals Group     BP  Pulse      Resp      Temp      Temp src      SpO2      Weight      Height      Head Circumference      Peak Flow      Pain Score      Pain Loc      Pain Edu?      Excl. in [place]?     Constitutional: Alert and oriented. Well appearing and in no distress. Eyes: Conjunctivae are normal. Normal extraocular movements. Cardiovascular: Rapid rate, irregular rhythm. No murmurs, rubs, or gallops. Respiratory: Normal respiratory effort without tachypnea nor retractions. Breath sounds are clear and equal  bilaterally. No wheezes/rales/rhonchi. Gastrointestinal: Soft and nontender. Normal bowel sounds Musculoskeletal: Nontender with normal range of motion in extremities. No lower extremity tenderness nor edema. Neurologic:  Normal speech and language. No gross focal neurologic deficits are appreciated.  Skin:  Skin is warm, dry and intact. No rash noted. Psychiatric: Mood and affect are normal. Speech and behavior are normal.  ____________________________________________  EKG: Interpreted by me.  Atrial fibrillation with a rate of 134 bpm, LVH with repolarization abnormality, possible septal infarct  ____________________________________________  ED COURSE:  As part of my medical decision making, I reviewed the following data within the [place] History obtained from family if available, nursing notes, old chart and ekg, as well as notes from prior ED visits. Patient presented for chest pain and atrial fibrillation, we will assess with labs and imaging as indicated at this time. Clinical Course as of Aug 15 1241  Wed Aug 16, 2019  1036 Heart rate under much better control after IV Cardizem.  Patient reports running out of his flecainide, metoprolol and Xarelto.   [JW]    Clinical Course User Index [JW] Earleen Newport, MD   Procedures  Darren Rogers was evaluated in Emergency Department on 08/16/2019 for the symptoms described in the history of present illness. He was evaluated in the context of the global COVID-19 pandemic, which necessitated consideration that the patient might be at risk for infection with the SARS-CoV-2 virus that causes COVID-19. Institutional protocols and algorithms that pertain to the evaluation of patients at risk for COVID-19 are in a state of rapid change based on information released by regulatory bodies including the CDC and federal and state organizations. These policies and algorithms were followed during the patient's care in the ED.   ____________________________________________   LABS (pertinent positives/negatives)  Labs Reviewed  CBC - Abnormal; Notable for the following components:      Result Value   RDW 15.7 (*)    All other components within normal limits  COMPREHENSIVE METABOLIC PANEL - Abnormal; Notable for the following components:   Glucose, Bld 110 (*)    All other components within normal limits  TROPONIN I (HIGH SENSITIVITY) - Abnormal; Notable for the following components:   Troponin I (High Sensitivity) 26 (*)    All other components within normal limits  TROPONIN I (HIGH SENSITIVITY) - Abnormal; Notable for the following components:   Troponin I (High Sensitivity) 25 (*)    All other components within normal limits    RADIOLOGY Images were viewed by me  Chest x-ray IMPRESSION:  No active disease.  ____________________________________________   DIFFERENTIAL DIAGNOSIS   Atrial fibrillation, MI, unstable angina, arrhythmia  FINAL ASSESSMENT AND PLAN  Chest pain, atrial fibrillation   Plan: The patient had presented  for atrial fibrillation and chest pain.  Pain started last night around 11 PM. Patient's labs reveal a slightly elevated troponin but this is not increasing. Patient's imaging was reassuring.  Patient was given initially his metoprolol and a dose of IV Cardizem.  We subsequently restarted his flecainide as well and gave him aspirin.  His heart rate has been under control and he is much less tachycardic approaching 100 bpm.  He is agreeable with going home and has no complaints at this time.  I will advise close outpatient follow-up with his cardiologist.   Ulice Dash, MD    Note: This note was generated in part or whole with voice recognition software. Voice recognition is usually quite accurate but there are transcription errors that can and very often do occur. I apologize for any typographical errors that were not detected and corrected.     Emily Filbert, MD 08/16/19 [phone]

## 2019-08-16 NOTE — ED Notes (Signed)
Pt denies CP/SHOB/dizziness/nause at this time.

## 2019-08-16 NOTE — ED Notes (Signed)
Pt provided with a blanket at this time

## 2019-08-21 ENCOUNTER — Ambulatory Visit: Payer: Self-pay | Admitting: Family

## 2019-08-21 NOTE — Progress Notes (Deleted)
Cardiology Office Note    Date:  08/21/2019   ID:  Darren Rogers, DOB 1955-07-11, MRN [id number]  PCP:  No primary care provider on file.  Cardiologist:  Kathlyn Sacramento, MD  Electrophysiologist:  None   Chief Complaint: ED follow-up  History of Present Illness:   Darren Rogers is a 64 y.o. male with history of nonobstructive CAD, PAF on Xarelto with intermittent medication adherence, HTN with hypertensive heart disease and highly abnormal EKG, HLD, ED, PAD, and ongoing tobacco use who presents for ED follow-up as detailed below.  He was diagnosed with A. fib greater than 20 years prior, though more recently had recurrent A. fib in 12/2013 and has been treated with flecainide and Xarelto since.  He underwent diagnostic LHC in 2015 in the setting of chest pain, during A. fib, which showed nonobstructive disease.  He had recurrent chest pain and A. fib in 2017 with a nonischemic Myoview at that time.  He has known PAD with mildly reduced ABI bilaterally with duplex showing diffuse nonobstructive disease.  He had a recurrent episode of A. fib in 07/2017, in the setting of medication adherence issues, which required a visit to the ED.  He was last seen in the office in 10/2017 and reported improvement in his symptoms with brief palpitations and stable dyspnea.  He also noted his claudication was stable.  He continues to smoke 1/2 pack/day and was not taking Xarelto "because he does not like blood thinners."  He continued to decline anticoagulation at that visit.  He was seen in the ED on 08/16/2019 with chest pain in the setting of being out of metoprolol.  With resumption of this medication by EMS his pain improved from a 10 out of 10 to a 0 out of 10.  Initial high-sensitivity troponin of 26 with a delta of 25.  Chest x-ray with no active disease.  EKG showed coarse A. fib versus atrial flutter with RVR with ventricular rate of 134 bpm, along with known ST-T changes.  He was subsequently given IV Cardizem  and restarted on his prior flecainide as well as aspirin given his refusal for anticoagulation.  At time of discharge he was less tachycardic and advised to follow-up as an outpatient.  ***   Labs independently reviewed: 07/2019 - potassium 4.0, BUN 15, serum creatinine 0.96, albumin 3.7, AST/ALT normal, Hgb 15.2, PLT 251 10/2017 - TC 151, TG 115, HDL 42, LDL 86 07/2017 - TSH normal, magnesium 2.0  Past Medical History:  Diagnosis Date  . Claudication ([place])    a. 06/2015 ABI: R - 0.73, L - 0.73. 30-49% bilat SFA stenosis. 50-74% R Profunda stenosis; b. 01/2017 ABI: R 0.89, L 0.88, TBI R 0.65, L 1.0.  . Erectile dysfunction   . Essential hypertension   . History of echocardiogram    a. 03/29/2014: EF 55-60%, mild LVH, normal RVSP;  b. 05/2015 Echo: EF 60-65%, no rwma, triv AI, mild MR, mildly dil LA.  Marland Kitchen Hyperlipidemia   . Non-obstructive CAD    a. 01/27/2014 Cath: LM nl, LAD mild diff dz w/o obs, LCx no sig obs - scattered 20-30% mLCx, RCA no obs dz, EF 70%; b. 06/2015 MV; EF 60%, no nischemia.  Marland Kitchen PAF (paroxysmal atrial fibrillation) ([place])    a. Pt says Dx >24yrs ago w/ ? RFCA @ [facility];  b. Recurrent 12/2013;  b. Rx Flecainide and Xarelto-->Intermittent compliance.  Marland Kitchen PVC's (premature ventricular contractions)    a. 05/2017 Zio: 4000 PVCs in 48 hrs (  2%). Brief run of SVT.  . Tobacco abuse    a. ongoing - 1 ppd.    Past Surgical History:  Procedure Laterality Date  . CARDIAC CATHETERIZATION    . [facility]ATION     [facility]  . LEFT HEART CATH Right 01/27/2014   Procedure: LEFT HEART CATH;  Surgeon: Micheline Chapman, MD;  Location[facility] C[facility] LAB;  Service: Cardiovascular;  Laterality: Right;    Current Medications: No outpatient medications have been marked as taking for the 08/24/19 encounter (Appointment) with Sondra Barges, PA-C.    Allergies:   Patient has no known allergies.   Social History   Socioeconomic History  . Marital status: Single    Spouse name: Not on file  .  Number of children: Not on file  . Years of education: Not on file  . Highest education level: Not on file  Occupational History  . Not on file  Tobacco Use  . Smoking status: Current Every Day Smoker    Packs/day: 0.50    Years: 30.00    Pack years: 15.00    Types: Cigarettes  . Smokeless tobacco: Never Used  Substance and Sexual Activity  . Alcohol use: Yes  . Drug use: No  . Sexual activity: Not on file  Other Topics Concern  . Not on file  Social History Narrative   The patient lives in [place] with his sister. He works in a substance abuse program. 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 He is a long-time smoker, one pack per day. No illicit drugs. He is not married.   Social Determinants of Health   Financial Resource Strain:   . Difficulty of Paying Living Expenses:   Food Insecurity:   . Worried About Programme researcher, broadcasting/film/video in the Last Year:   . Barista in the Last Year:   Transportation Needs:   . Freight forwarder (Medical):   Marland Kitchen Lack of Transportation (Non-Medical):   Physical Activity:   . Days of Exercise per Week:   . Minutes of Exercise per Session:   Stress:   . Feeling of Stress :   Social Connections:   . Frequency of Communication with Friends and Family:   . Frequency of Social Gatherings with Friends and Family:   . Attends Religious Services:   . Active Member of Clubs or Organizations:   . Attends Banker Meetings:   Marland Kitchen Marital Status:      Family History:  The patient's family history includes Coronary artery disease (age of onset: 58) in his father; Diabetes in his brother, maternal grandmother, mother, sister, sister, and sister.  ROS:   ROS   EKGs/Labs/Other Studies Reviewed:    Studies reviewed were summarized above. The additional studies were reviewed today:  Zio patch 04/2017: Normal sinus rhythm. No evidence of atrial fibrillation. 1 short run of SVT lasting 4 beats  with a maximal rate of 146 bpm. Occasional PVCs with a total of 4000 beats in 48 hours representing 2% burden. __________  2D echo 05/2015: - Left ventricle: The cavity size was normal. There was mild  concentric hypertrophy. Systolic function was normal. The  estimated ejection fraction was in the range of 60% to 65%. Wall  motion was normal; there were no regional wall motion  abnormalities. Left ventricular diastolic function parameters  were normal.  - Aortic valve: There was trivial regurgitation.  - Mitral valve: There was  mild regurgitation.  - Left atrium: The atrium was mildly dilated.  __________    EKG:  EKG is ordered today.  The EKG ordered today demonstrates ***  Recent Labs: 08/16/2019: ALT 18; BUN 15; Creatinine, Ser 0.96; Hemoglobin 15.2; Platelets 251; Potassium 4.0; Sodium 139  Recent Lipid Panel    Component Value Date/Time   CHOL 151 11/04/2017 0900   CHOL 234 (H) 12/06/2011 0132   TRIG 115 11/04/2017 0900   TRIG 381 (H) 12/06/2011 0132   HDL 42 11/04/2017 0900   HDL 34 (L) 12/06/2011 0132   CHOLHDL 3.6 11/04/2017 0900   CHOLHDL 6.5 11/17/2014 0427   VLDL 29 11/17/2014 0427   VLDL 76 (H) 12/06/2011 0132   LDLCALC 86 11/04/2017 0900   LDLCALC 124 (H) 12/06/2011 0132    PHYSICAL EXAM:    VS:  There were no vitals taken for this visit.  BMI: There is no height or weight on file to calculate BMI.  Physical Exam  Wt Readings from Last 3 Encounters:  08/16/19 150 lb (68 kg)  05/04/19 150 lb (68 kg)  03/21/18 170 lb (77.1 kg)     ASSESSMENT & PLAN:   1. ***  Disposition: F/u with Dr. Kirke Corin or an APP in ***.   Medication Adjustments/Labs and Tests Ordered: Current medicines are reviewed at length with the patient today.  Concerns regarding medicines are outlined above. Medication changes, Labs and Tests ordered today are summarized above and listed in the Patient Instructions accessible in Encounters.   Signed, Eula Listen,  PA-C 08/21/2019 1:36 PM     [facility] [place] [address] [place], [place] [address] [phone]

## 2019-08-24 ENCOUNTER — Ambulatory Visit: Payer: Self-pay | Admitting: Physician Assistant

## 2019-08-25 ENCOUNTER — Encounter: Payer: Self-pay | Admitting: Physician Assistant

## 2019-09-29 ENCOUNTER — Emergency Department
Admission: EM | Admit: 2019-09-29 | Discharge: 2019-09-29 | Disposition: A | Discharge status: Home / Self Care | Payer: Self-pay | Attending: Emergency Medicine | Admitting: Emergency Medicine

## 2019-09-29 ENCOUNTER — Emergency Department
Admission: EM | Admit: 2019-09-29 | Discharge: 2019-09-30 | Payer: Self-pay | Attending: Emergency Medicine | Admitting: Emergency Medicine

## 2019-09-29 ENCOUNTER — Other Ambulatory Visit: Payer: Self-pay

## 2019-09-29 ENCOUNTER — Encounter: Payer: Self-pay | Admitting: Emergency Medicine

## 2019-09-29 DIAGNOSIS — Z20822 Contact with and (suspected) exposure to covid-19: Secondary | ICD-10-CM | POA: Insufficient documentation

## 2019-09-29 DIAGNOSIS — Z5321 Procedure and treatment not carried out due to patient leaving prior to being seen by health care provider: Secondary | ICD-10-CM | POA: Insufficient documentation

## 2019-09-29 DIAGNOSIS — F1029 Alcohol dependence with unspecified alcohol-induced disorder: Secondary | ICD-10-CM

## 2019-09-29 DIAGNOSIS — I2510 Atherosclerotic heart disease of native coronary artery without angina pectoris: Secondary | ICD-10-CM | POA: Insufficient documentation

## 2019-09-29 DIAGNOSIS — Z79899 Other long term (current) drug therapy: Secondary | ICD-10-CM | POA: Insufficient documentation

## 2019-09-29 DIAGNOSIS — I119 Hypertensive heart disease without heart failure: Secondary | ICD-10-CM | POA: Insufficient documentation

## 2019-09-29 DIAGNOSIS — Z7901 Long term (current) use of anticoagulants: Secondary | ICD-10-CM | POA: Insufficient documentation

## 2019-09-29 DIAGNOSIS — I480 Paroxysmal atrial fibrillation: Secondary | ICD-10-CM | POA: Insufficient documentation

## 2019-09-29 DIAGNOSIS — F10220 Alcohol dependence with intoxication, uncomplicated: Secondary | ICD-10-CM | POA: Insufficient documentation

## 2019-09-29 DIAGNOSIS — F1490 Cocaine use, unspecified, uncomplicated: Secondary | ICD-10-CM | POA: Insufficient documentation

## 2019-09-29 DIAGNOSIS — F1099 Alcohol use, unspecified with unspecified alcohol-induced disorder: Secondary | ICD-10-CM | POA: Insufficient documentation

## 2019-09-29 DIAGNOSIS — Y907 Blood alcohol level of 200-239 mg/100 ml: Secondary | ICD-10-CM | POA: Insufficient documentation

## 2019-09-29 DIAGNOSIS — F17210 Nicotine dependence, cigarettes, uncomplicated: Secondary | ICD-10-CM | POA: Insufficient documentation

## 2019-09-29 LAB — TROPONIN I (HIGH SENSITIVITY): Troponin I (High Sensitivity): 17 ng/L (ref <18)

## 2019-09-29 LAB — COMPREHENSIVE METABOLIC PANEL
ALT: 33 U/L (ref 0–44)
AST: 47 U/L — ABNORMAL HIGH (ref 15–41)
Albumin: 4.3 g/dL (ref 3.5–5.0)
Alkaline Phosphatase: 80 U/L (ref 38–126)
Anion gap: 15 (ref 5–15)
BUN: 9 mg/dL (ref 8–23)
CO2: 20 mmol/L — ABNORMAL LOW (ref 22–32)
Calcium: 8.9 mg/dL (ref 8.9–10.3)
Chloride: 101 mmol/L (ref 98–111)
Creatinine, Ser: 0.89 mg/dL (ref 0.61–1.24)
GFR calc Af Amer: >60 mL/min (ref >60)
GFR calc non Af Amer: >60 mL/min (ref >60)
Glucose, Bld: 85 mg/dL (ref 70–99)
Potassium: 4.4 mmol/L (ref 3.5–5.1)
Sodium: 136 mmol/L (ref 135–145)
Total Bilirubin: 0.7 mg/dL (ref 0.3–1.2)
Total Protein: 8.1 g/dL (ref 6.5–8.1)

## 2019-09-29 LAB — CBC
HCT: 45.1 % (ref 39.0–52.0)
Hemoglobin: 16.1 g/dL (ref 13.0–17.0)
MCH: 32.5 pg (ref 26.0–34.0)
MCHC: 35.7 g/dL (ref 30.0–36.0)
MCV: 90.9 fL (ref 80.0–100.0)
Platelets: 235 10*3/uL (ref 150–400)
RBC: 4.96 MIL/uL (ref 4.22–5.81)
RDW: 16 % — ABNORMAL HIGH (ref 11.5–15.5)
WBC: 8.6 10*3/uL (ref 4.0–10.5)
nRBC: 0 % (ref 0.0–0.2)

## 2019-09-29 LAB — ACETAMINOPHEN LEVEL: Acetaminophen (Tylenol), Serum: <10 ug/mL — ABNORMAL LOW (ref 10–30)

## 2019-09-29 LAB — SALICYLATE LEVEL: Salicylate Lvl: <7 mg/dL — ABNORMAL LOW (ref 7.0–30.0)

## 2019-09-29 LAB — ETHANOL: Alcohol, Ethyl (B): 210 mg/dL — ABNORMAL HIGH (ref <10)

## 2019-09-29 NOTE — ED Triage Notes (Signed)
Pt arrives to ED via POV from home requesting detox from alcohol. Pt reports last ETOH use was 30 mins PTA. Pt is A&O, in NAD; RR even, regular, and unlabored.

## 2019-09-29 NOTE — ED Notes (Signed)
Pt requesting detox from alcohol. Reports sober for 8 years and started drinking a few months ago. Now drinking several 40 oz beers a day.

## 2019-09-29 NOTE — ED Notes (Signed)
No answer when called from lobby x2. 

## 2019-09-29 NOTE — ED Notes (Signed)
No answer when called from lobby x1 

## 2019-09-29 NOTE — ED Notes (Signed)
Pt given [place] sandwich and ginger ale with ice. No other needs voiced at this time.

## 2019-09-29 NOTE — ED Triage Notes (Signed)
Patient states that he is here for help with detox from alcohol. Patient states that he drinks 5- 6 40 oz beers a day and has been drinking the last 30 days.

## 2019-09-29 NOTE — ED Notes (Signed)
No answer when called from lobby x3. 

## 2019-09-29 NOTE — ED Provider Notes (Signed)
[facility] Emergency Department Provider Note  Time seen: 9:46 PM  I have reviewed the triage vital signs and the nursing notes.   HISTORY  Chief Complaint Addiction Problem   HPI Darren Rogers is a 64 y.o. male with a past medical history of hypertension, hyperlipidemia, CAD, atrial fibrillation, presents to the emergency department for alcohol use disorder.   According to the patient he had been 8 years sober until approximately a year and a half ago due to family issues he began drinking once again.  Patient states he is now drinking heavily admits to drinking earlier today and using crack cocaine 2 days ago.  Patient used to work at [employer], spoke to a Biomedical engineer who recommended he come to the emergency department to help with placement in for medical clearance.  Here the patient appears well calm cooperative.  States he has been experiencing some intermittent chest pain over the past several weeks but denies any currently.  Largely negative review of systems otherwise.  Past Medical History:  Diagnosis Date  . Claudication ([place])    a. 06/2015 ABI: R - 0.73, L - 0.73. 30-49% bilat SFA stenosis. 50-74% R Profunda stenosis; b. 01/2017 ABI: R 0.89, L 0.88, TBI R 0.65, L 1.0.  . Erectile dysfunction   . Essential hypertension   . History of echocardiogram    a. 03/29/2014: EF 55-60%, mild LVH, normal RVSP;  b. 05/2015 Echo: EF 60-65%, no rwma, triv AI, mild MR, mildly dil LA.  Marland Kitchen Hyperlipidemia   . Non-obstructive CAD    a. 01/27/2014 Cath: LM nl, LAD mild diff dz w/o obs, LCx no sig obs - scattered 20-30% mLCx, RCA no obs dz, EF 70%; b. 06/2015 MV; EF 60%, no nischemia.  Marland Kitchen PAF (paroxysmal atrial fibrillation) ([place])    a. Pt says Dx >2yrs ago w/ ? RFCA @ [facility];  b. Recurrent 12/2013;  b. Rx Flecainide and Xarelto-->Intermittent compliance.  Marland Kitchen PVC's (premature ventricular contractions)    a. 05/2017 Zio: 4000 PVCs in 48 hrs (2%). Brief run of SVT.  . Tobacco abuse    a. ongoing - 1 ppd.    Patient Active Problem List   Diagnosis Date Noted  . A-fib ([place]) 01/15/2017  . Personal history of tobacco use, presenting hazards to health 08/17/2016  . BPH associated with nocturia 07/30/2016  . IFG (impaired fasting glucose) 07/30/2016  . Essential hypertension   . Claudication ([place])   . 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 CAD in native artery 04/11/2014  . Paroxysmal atrial fibrillation ([facility])   . Tobacco abuse   . Hyperlipidemia   . Atrial fibrillation with rapid ventricular response ([place]) 01/27/2014    Past Surgical History:  Procedure Laterality Date  . CARDIAC CATHETERIZATION    . [facility]     [facility]  . LEFT HEART CATH Right 01/27/2014   Procedure: LEFT HEART CATH;  Surgeon: Blane Ohara, MD;  Location: [facility] CATH LAB;  Service: Cardiovascular;  Laterality: Right;    Prior to Admission medications   Medication Sig Start Date End Date Taking? Authorizing Provider  aspirin EC 81 MG tablet Take 1 tablet (81 mg total) by mouth daily. 08/16/19   Earleen Newport, MD  flecainide (TAMBOCOR) 100 MG tablet Take 1 tablet (100 mg total) by mouth every 12 (twelve) hours. 08/16/19   Earleen Newport, MD  metoprolol tartrate (LOPRESSOR) 100 MG tablet Take 0.5 tablets (50 mg total) by mouth 2 (two) times daily. 08/16/19  Emily Filbert, MD  rivaroxaban (XARELTO) 20 MG TABS tablet Take 1 tablet (20 mg total) by mouth daily with supper. 08/16/19   Emily Filbert, MD  rosuvastatin (CRESTOR) 20 MG tablet Take 1 tablet (20 mg total) by mouth daily. 08/16/19   Emily Filbert, MD  sildenafil (VIAGRA) 25 MG tablet Take 1 tablet (25 mg total) by mouth daily as needed for erectile dysfunction. 01/22/15   Iran Ouch, MD    No Known Allergies  Family History  Problem Relation Age of Onset  . Coronary artery disease Father 76  . Diabetes Mother   . Diabetes Sister   . Diabetes Brother   . Diabetes Maternal  Grandmother   . Diabetes Sister   . Diabetes Sister     Social History Social History   Tobacco Use  . Smoking status: Current Every Day Smoker    Packs/day: 0.50    Years: 30.00    Pack years: 15.00    Types: Cigarettes  . Smokeless tobacco: Never Used  Substance Use Topics  . Alcohol use: Yes  . Drug use: No    Review of Systems Constitutional: Negative for fever. Cardiovascular: Positive for intermittent chest pain Respiratory: Negative for shortness of breath. Gastrointestinal: Negative for abdominal pain, vomiting  Musculoskeletal: Negative for musculoskeletal complaints Neurological: Negative for headache All other ROS negative  ____________________________________________   PHYSICAL EXAM:  VITAL SIGNS: ED Triage Vitals  Enc Vitals Group     BP 09/29/19 2034 (!) 164/89     Pulse Rate 09/29/19 2034 (!) 112     Resp 09/29/19 2034 18     Temp 09/29/19 2034 98.3 F (36.8 C)     Temp Source 09/29/19 2034 Oral     SpO2 09/29/19 2034 98 %     Weight 09/29/19 2035 150 lb (68 kg)     Height 09/29/19 2035 5\' 6"  (1.676 m)     Head Circumference --      Peak Flow --      Pain Score 09/29/19 2035 2     Pain Loc --      Pain Edu? --      Excl. in GC? --    Constitutional: Alert and oriented. Well appearing and in no distress. Eyes: Normal exam ENT      Head: Normocephalic and atraumatic.      Mouth/Throat: Mucous membranes are moist. Cardiovascular: Normal rate, regular rhythm.  Respiratory: Normal respiratory effort without tachypnea nor retractions. Breath sounds are clear  Gastrointestinal: Soft and nontender. No distention.   Musculoskeletal: Nontender with normal range of motion in all extremities.  Neurologic:  Normal speech and language. No gross focal neurologic deficits  Skin:  Skin is warm, dry and intact.  Psychiatric: Mood and affect are normal.   ____________________________________________    EKG  EKG viewed and interpreted by myself shows  a normal sinus rhythm 87 bpm with a narrow QRS, normal axis, normal intervals.  Patient does have inferolateral T wave inversions however largely unchanged from prior EKG.  ____________________________________________   INITIAL IMPRESSION / ASSESSMENT AND PLAN / ED COURSE  Pertinent labs & imaging results that were available during my care of the patient were reviewed by me and considered in my medical decision making (see chart for details).   Patient presents to the emergency department hoping to detox from alcohol.  Does admit to recent crack cocaine use 2 days ago.  Also admits intermittent chest pain over the past several weeks.  EKG is abnormal however unchanged from prior EKG.  Lab work is reassuring including a negative troponin.  No chest pain at this time.  We will continue to closely monitor the patient in the emergency department.  We have consulted TTS to help arrange for placement hopefully to [facility].  Patient agreeable and is here voluntarily.  Darren Rogers was evaluated in Emergency Department on 09/29/2019 for the symptoms described in the history of present illness. He was evaluated in the context of the global COVID-19 pandemic, which necessitated consideration that the patient might be at risk for infection with the SARS-CoV-2 virus that causes COVID-19. Institutional protocols and algorithms that pertain to the evaluation of patients at risk for COVID-19 are in a state of rapid change based on information released by regulatory bodies including the CDC and federal and state organizations. These policies and algorithms were followed during the patient's care in the ED.  ____________________________________________   FINAL CLINICAL IMPRESSION(S) / ED DIAGNOSES  Alcohol use disorder   Minna Antis, MD 09/29/19 2317

## 2019-09-30 LAB — URINE DRUG SCREEN, QUALITATIVE (ARMC ONLY)
Amphetamines, Ur Screen: NOT DETECTED
Barbiturates, Ur Screen: NOT DETECTED
Benzodiazepine, Ur Scrn: NOT DETECTED
Cannabinoid 50 Ng, Ur ~~LOC~~: NOT DETECTED
Cocaine Metabolite,Ur ~~LOC~~: POSITIVE — AB
MDMA (Ecstasy)Ur Screen: NOT DETECTED
Methadone Scn, Ur: NOT DETECTED
Opiate, Ur Screen: NOT DETECTED
Phencyclidine (PCP) Ur S: NOT DETECTED
Tricyclic, Ur Screen: NOT DETECTED

## 2019-09-30 LAB — RESPIRATORY PANEL BY RT PCR (FLU A&B, COVID)
Influenza A by PCR: NEGATIVE
Influenza B by PCR: NEGATIVE
SARS Coronavirus 2 by RT PCR: NEGATIVE

## 2019-09-30 NOTE — BH Assessment (Signed)
Patient has been accepted to [facility].   Representative was [place].   ER Staff is aware of it:  Dr. Dolores Frame, ER MD  Dewayne Hatch Patient's Nurse

## 2019-09-30 NOTE — ED Notes (Signed)
Pt informed of bed at [facility] available and they will come to pick him up in the next half hour. Pt agreeable.

## 2019-09-30 NOTE — Discharge Instructions (Addendum)
Return to the ER for worsening symptoms, persistent vomiting, difficulty breathing or other concerns. °

## 2019-09-30 NOTE — ED Provider Notes (Signed)
-----------------------------------------   3:45 AM on 09/30/2019 -----------------------------------------  Patient accepted to [facility].  Transport will be here around 4:15 AM.  Strict return precautions given.  Patient verbalizes understanding and agrees with plan of care.   Irean Hong, MD 09/30/19 [phone]

## 2019-09-30 NOTE — BH Assessment (Signed)
Patient has been faxed to the following inpatient detox facilities:   [facility] [phone])

## 2019-09-30 NOTE — BH Assessment (Signed)
Assessment Note  Darren Rogers is an 64 y.o. male presenting to [facility] ED voluntarily, per triage note Pt arrives to ED via POV from home requesting detox from alcohol. Pt reports last ETOH use was 30 mins PTA. Pt is A&O, in NAD; RR even, regular, and unlabored. During assessment patient appeared alert and oriented x4, pleasant and cooperative. Patient reported that he relapsed 6 months ago and has been drinking alcohol daily since. Patient reported that he was clean for the past 8 years but relapsed due to a recent break-up in his relationship. Patient reported currently drinking 4-5 40oz beers and reports age of first use at 38. Patient reported his last drink being at 12:30pm on 09/29/19. Patient denies any current withdrawal symptoms.  Patient is recommended detox treatment and verbally consented to Release of information to find detox treatment.  Diagnosis: Alcohol Abuse, Alcohol Use Disorder Severe  Past Medical History:  Past Medical History:  Diagnosis Date  . Claudication ([facility])    a. 06/2015 ABI: R - 0.73, L - 0.73. 30-49% bilat SFA stenosis. 50-74% R Profunda stenosis; b. 01/2017 ABI: R 0.89, L 0.88, TBI R 0.65, L 1.0.  . Erectile dysfunction   . Essential hypertension   . History of echocardiogram    a. 03/29/2014: EF 55-60%, mild LVH, normal RVSP;  b. 05/2015 Echo: EF 60-65%, no rwma, triv AI, mild MR, mildly dil LA.  Marland Kitchen Hyperlipidemia   . Non-obstructive CAD    a. 01/27/2014 Cath: LM nl, LAD mild diff dz w/o obs, LCx no sig obs - scattered 20-30% mLCx, RCA no obs dz, EF 70%; b. 06/2015 MV; EF 60%, no nischemia.  Marland Kitchen PAF (paroxysmal atrial fibrillation) ([facility])    a. Pt says Dx >67yrs ago w/ ? RFCA @ [facility];  b. Recurrent 12/2013;  b. Rx Flecainide and Xarelto-->Intermittent compliance.  Marland Kitchen PVC's (premature ventricular contractions)    a. 05/2017 Zio: 4000 PVCs in 48 hrs (2%). Brief run of SVT.  . Tobacco abuse    a. ongoing - 1 ppd.    Past Surgical History:  Procedure Laterality Date  .  CARDIAC CATHETERIZATION    . [facility]IZATION     [facility]  . LEFT HEART CATH Right 01/27/2014   Procedure: LEFT HEART CATH;  Surgeon: Micheline Chapman, MD;  Location: [facility] CATH LAB;  Service: Cardiovascular;  Laterality: Right;    Family History:  Family History  Problem Relation Age of Onset  . Coronary artery disease Father 57  . Diabetes Mother   . Diabetes Sister   . Diabetes Brother   . Diabetes Maternal Grandmother   . Diabetes Sister   . Diabetes Sister     Social History:  reports that he has been smoking cigarettes. He has a 15.00 pack-year smoking history. He has never used smokeless tobacco. He reports current alcohol use. He reports that he does not use drugs.  Additional Social History:  Alcohol / Drug Use Pain Medications: See MAR Prescriptions: See MAR Over the Counter: See MAR History of alcohol / drug use?: Yes Substance #1 Name of Substance 1: Alcohol 1 - Age of First Use: 11 1 - Amount (size/oz): 4-5 40oz beers 1 - Frequency: daily 1 - Duration: 6 months 1 - Last Use / Amount: 09/29/19  CIWA: CIWA-Ar BP: (!) 164/89 Pulse Rate: (!) 112 COWS:    Allergies: No Known Allergies  Home Medications: (Not in a hospital admission)   OB/GYN Status:  No LMP for male patient.  General Assessment  Data Location of Assessment: [facility] ED TTS Assessment: In system Is this a Tele or Face-to-Face Assessment?: Face-to-Face Is this an Initial Assessment or a Re-assessment for this encounter?: Initial Assessment Patient Accompanied by:: N/A Language Other than English: No Living Arrangements: Other (Comment)(Private Residence) What gender do you identify as?: Male Marital status: Single Pregnancy Status: No Living Arrangements: Other relatives Can pt return to current living arrangement?: Yes Admission Status: Voluntary Is patient capable of signing voluntary admission?: Yes Referral Source: Self/Family/Friend Insurance type: None  Medical Screening Exam [facility]  Walk-in ONLY) Medical Exam completed: Yes  Crisis Care Plan Living Arrangements: Other relatives Legal Guardian: Other:(Self) Name of Psychiatrist: None Name of Therapist: None  Education Status Is patient currently in school?: No Is the patient employed, unemployed or receiving disability?: Unemployed  Risk to self with the past 6 months Suicidal Ideation: No Has patient been a risk to self within the past 6 months prior to admission? : No Suicidal Intent: No Has patient had any suicidal intent within the past 6 months prior to admission? : No Is patient at risk for suicide?: No Suicidal Plan?: No Has patient had any suicidal plan within the past 6 months prior to admission? : No Access to Means: No What has been your use of drugs/alcohol within the last 12 months?: Alcohol abuse Previous Attempts/Gestures: No How many times?: 0 Triggers for Past Attempts: None known Intentional Self Injurious Behavior: None Family Suicide History: No Recent stressful life event(s): Other (Comment)(Recent relationship break up) Persecutory voices/beliefs?: No Depression: No Substance abuse history and/or treatment for substance abuse?: Yes Suicide prevention information given to non-admitted patients: Not applicable  Risk to Others within the past 6 months Homicidal Ideation: No Does patient have any lifetime risk of violence toward others beyond the six months prior to admission? : No Thoughts of Harm to Others: No Current Homicidal Intent: No Current Homicidal Plan: No Access to Homicidal Means: No Identified Victim: None History of harm to others?: No Assessment of Violence: None Noted Violent Behavior Description: None Does patient have access to weapons?: No Criminal Charges Pending?: No Does patient have a court date: No Is patient on probation?: No  Psychosis Hallucinations: None noted Delusions: None noted  Mental Status Report Appearance/Hygiene: In scrubs Eye  Contact: Good Motor Activity: Freedom of movement Speech: Logical/coherent Level of Consciousness: Alert Mood: Pleasant Affect: Appropriate to circumstance Anxiety Level: None Thought Processes: Coherent Judgement: Unimpaired Orientation: Person, Place, Time, Situation, Appropriate for developmental age Obsessive Compulsive Thoughts/Behaviors: None  Cognitive Functioning Concentration: Normal Memory: Recent Intact, Remote Intact Is patient IDD: No Insight: Good Impulse Control: Fair Appetite: Fair Have you had any weight changes? : No Change Sleep: Decreased Total Hours of Sleep: 0 Vegetative Symptoms: None  ADLScreening [facility] Assessment Services) Patient's cognitive ability adequate to safely complete daily activities?: Yes Patient able to express need for assistance with ADLs?: Yes Independently performs ADLs?: Yes (appropriate for developmental age)  Prior Inpatient Therapy Prior Inpatient Therapy: No  Prior Outpatient Therapy Prior Outpatient Therapy: No Does patient have an ACCT team?: No Does patient have Intensive In-House Services?  : No Does patient have [facility] services? : No Does patient have P4CC services?: No  ADL Screening (condition at time of admission) Patient's cognitive ability adequate to safely complete daily activities?: Yes Is the patient deaf or have difficulty hearing?: No Does the patient have difficulty seeing, even when wearing glasses/contacts?: No Does the patient have difficulty concentrating, remembering, or making decisions?: No Patient able to  express need for assistance with ADLs?: Yes Does the patient have difficulty dressing or bathing?: No Independently performs ADLs?: Yes (appropriate for developmental age) Does the patient have difficulty walking or climbing stairs?: No Weakness of Legs: None Weakness of Arms/Hands: None  Home Assistive Devices/Equipment Home Assistive Devices/Equipment: None  Therapy Consults (therapy  consults require a physician order) PT Evaluation Needed: No OT Evalulation Needed: No SLP Evaluation Needed: No Abuse/Neglect Assessment (Assessment to be complete while patient is alone) Abuse/Neglect Assessment Can Be Completed: Yes Physical Abuse: Denies Verbal Abuse: Denies Sexual Abuse: Denies Exploitation of patient/patient's resources: Denies Self-Neglect: Denies Values / Beliefs Cultural Requests During Hospitalization: None Spiritual Requests During Hospitalization: None Consults Spiritual Care Consult Needed: No Transition of Care Team Consult Needed: No Advance Directives (For Healthcare) Does Patient Have a Medical Advance Directive?: No          Disposition: Patient is recommended detox treatment and verbally consented to Release of information to find detox treatment. Disposition Initial Assessment Completed for this Encounter: Yes Patient referred to: [facility]  On Site Evaluation by:   Reviewed with Physician:    Leonie Douglas MS [place] 09/30/2019 2:07 AM

## 2019-10-06 ENCOUNTER — Emergency Department: Payer: Self-pay

## 2019-10-06 ENCOUNTER — Other Ambulatory Visit: Payer: Self-pay

## 2019-10-06 ENCOUNTER — Emergency Department
Admission: EM | Admit: 2019-10-06 | Discharge: 2019-10-06 | Disposition: A | Discharge status: Home / Self Care | Payer: Self-pay | Attending: Emergency Medicine | Admitting: Emergency Medicine

## 2019-10-06 DIAGNOSIS — Y904 Blood alcohol level of 80-99 mg/100 ml: Secondary | ICD-10-CM | POA: Insufficient documentation

## 2019-10-06 DIAGNOSIS — I10 Essential (primary) hypertension: Secondary | ICD-10-CM | POA: Insufficient documentation

## 2019-10-06 DIAGNOSIS — F17210 Nicotine dependence, cigarettes, uncomplicated: Secondary | ICD-10-CM | POA: Insufficient documentation

## 2019-10-06 DIAGNOSIS — R0789 Other chest pain: Secondary | ICD-10-CM | POA: Insufficient documentation

## 2019-10-06 DIAGNOSIS — I2510 Atherosclerotic heart disease of native coronary artery without angina pectoris: Secondary | ICD-10-CM | POA: Insufficient documentation

## 2019-10-06 DIAGNOSIS — R079 Chest pain, unspecified: Secondary | ICD-10-CM

## 2019-10-06 DIAGNOSIS — R778 Other specified abnormalities of plasma proteins: Secondary | ICD-10-CM | POA: Insufficient documentation

## 2019-10-06 DIAGNOSIS — Z79899 Other long term (current) drug therapy: Secondary | ICD-10-CM | POA: Insufficient documentation

## 2019-10-06 DIAGNOSIS — F10929 Alcohol use, unspecified with intoxication, unspecified: Secondary | ICD-10-CM | POA: Insufficient documentation

## 2019-10-06 LAB — BASIC METABOLIC PANEL
Anion gap: 10 (ref 5–15)
BUN: 12 mg/dL (ref 8–23)
CO2: 24 mmol/L (ref 22–32)
Calcium: 8.7 mg/dL — ABNORMAL LOW (ref 8.9–10.3)
Chloride: 103 mmol/L (ref 98–111)
Creatinine, Ser: 1.13 mg/dL (ref 0.61–1.24)
GFR calc Af Amer: >60 mL/min (ref >60)
GFR calc non Af Amer: >60 mL/min (ref >60)
Glucose, Bld: 78 mg/dL (ref 70–99)
Potassium: 3.8 mmol/L (ref 3.5–5.1)
Sodium: 137 mmol/L (ref 135–145)

## 2019-10-06 LAB — CBC
HCT: 44.9 % (ref 39.0–52.0)
Hemoglobin: 15.3 g/dL (ref 13.0–17.0)
MCH: 32.4 pg (ref 26.0–34.0)
MCHC: 34.1 g/dL (ref 30.0–36.0)
MCV: 95.1 fL (ref 80.0–100.0)
Platelets: 236 10*3/uL (ref 150–400)
RBC: 4.72 MIL/uL (ref 4.22–5.81)
RDW: 15.1 % (ref 11.5–15.5)
WBC: 6.8 10*3/uL (ref 4.0–10.5)
nRBC: 0 % (ref 0.0–0.2)

## 2019-10-06 LAB — TROPONIN I (HIGH SENSITIVITY)
Troponin I (High Sensitivity): 42 ng/L — ABNORMAL HIGH (ref <18)
Troponin I (High Sensitivity): 46 ng/L — ABNORMAL HIGH (ref <18)

## 2019-10-06 LAB — HEPATIC FUNCTION PANEL
ALT: 23 U/L (ref 0–44)
AST: 26 U/L (ref 15–41)
Albumin: 3.8 g/dL (ref 3.5–5.0)
Alkaline Phosphatase: 79 U/L (ref 38–126)
Bilirubin, Direct: <0.1 mg/dL (ref 0.0–0.2)
Total Bilirubin: 0.3 mg/dL (ref 0.3–1.2)
Total Protein: 7.5 g/dL (ref 6.5–8.1)

## 2019-10-06 LAB — LIPASE, BLOOD: Lipase: 28 U/L (ref 11–51)

## 2019-10-06 LAB — ETHANOL: Alcohol, Ethyl (B): 99 mg/dL — ABNORMAL HIGH (ref <10)

## 2019-10-06 MED ORDER — ASPIRIN 81 MG PO CHEW
324.0000 mg | CHEWABLE_TABLET | Freq: Once | ORAL | Status: AC
  Administered 2019-10-06: 324 mg via ORAL
  Filled 2019-10-06: qty 4

## 2019-10-06 NOTE — ED Triage Notes (Signed)
Pt arrives to ED via [facility] from his sister's house with c/o CP. Pt reports CP started 2 hrs PTA; reports pain is centralized and described as "sharp". Pt reports (+) nausea, but denies V/D. No SHOB or fever. Pt also reports recently being seen here for ETOH detox (EMS reports last ETOH use was tonight just PTA).

## 2019-10-06 NOTE — ED Provider Notes (Addendum)
[facility] Emergency Department Provider Note  ____________________________________________   First MD Initiated Contact with Patient 10/06/19 0130     (approximate)  I have reviewed the triage vital signs and the nursing notes.   HISTORY  Chief Complaint Chest Pain    HPI Darren Rogers is a 64 y.o. male with medical history as listed below which also notably includes chronic alcohol abuse.  He presents tonight by EMS for evaluation of acute onset sharp chest pain that he describes as being across both sides of his upper chest.  He has some nausea but no vomiting and no diarrhea.  No abdominal pain.  He has not had any shortness of breath, cough, dizziness, or fever/chills.  He has had several ED visits recently for alcohol abuse and detox including a stay at [facility].  He admits to some alcohol use tonight but adamantly denies any drug use including cocaine.  He said he is intermittently compliant with his medications including flecainide and metoprolol but he says that he did take them tonight.  He said that he felt that he was having an episode of A. fib and he was able to get it to relax by massaging his neck (he gestured towards his carotid artery).  He said that currently he is not having any pain.  Nothing in particular made the symptoms better or worse.  He had a cardiac catheterization years ago but did not have a stent placement.  He sees Dr. Kirke Corin for his cardiology care.         Past Medical History:  Diagnosis Date  . Claudication (HCC)    a. 06/2015 ABI: R - 0.73, L - 0.73. 30-49% bilat SFA stenosis. 50-74% R Profunda stenosis; b. 01/2017 ABI: R 0.89, L 0.88, TBI R 0.65, L 1.0.  . Erectile dysfunction   . Essential hypertension   . History of echocardiogram    a. 03/29/2014: EF 55-60%, mild LVH, normal RVSP;  b. 05/2015 Echo: EF 60-65%, no rwma, triv AI, mild MR, mildly dil LA.  Marland Kitchen Hyperlipidemia   . Non-obstructive CAD    a. 01/27/2014 Cath: LM nl,  LAD mild diff dz w/o obs, LCx no sig obs - scattered 20-30% mLCx, RCA no obs dz, EF 70%; b. 06/2015 MV; EF 60%, no nischemia.  Marland Kitchen PAF (paroxysmal atrial fibrillation) (HCC)    a. Pt says Dx >40yrs ago w/ ? RFCA @ [facility];  b. Recurrent 12/2013;  b. Rx Flecainide and Xarelto-->Intermittent compliance.  Marland Kitchen PVC's (premature ventricular contractions)    a. 05/2017 Zio: 4000 PVCs in 48 hrs (2%). Brief run of SVT.  . Tobacco abuse    a. ongoing - 1 ppd.    Patient Active Problem List   Diagnosis Date Noted  . A-fib (HCC) 01/15/2017  . Personal history of tobacco use, presenting hazards to health 08/17/2016  . BPH associated with nocturia 07/30/2016  . IFG (impaired fasting glucose) 07/30/2016  . Essential hypertension   . Claudication (HCC)   . 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 CAD in native artery 04/11/2014  . Paroxysmal atrial fibrillation (HCC)   . Tobacco abuse   . Hyperlipidemia   . Atrial fibrillation with rapid ventricular response (HCC) 01/27/2014    Past Surgical History:  Procedure Laterality Date  . CARDIAC CATHETERIZATION    . [facility]     [facility]  . LEFT HEART CATH Right 01/27/2014   Procedure: LEFT HEART CATH;  Surgeon: Micheline Chapman, MD;  Location[facility]  CATH LAB;  Service: Cardiovascular;  Laterality: Right;    Prior to Admission medications   Medication Sig Start Date End Date Taking? Authorizing Provider  aspirin EC 81 MG tablet Take 1 tablet (81 mg total) by mouth daily. 08/16/19   Emily Filbert, MD  flecainide (TAMBOCOR) 100 MG tablet Take 1 tablet (100 mg total) by mouth every 12 (twelve) hours. 08/16/19   Emily Filbert, MD  metoprolol tartrate (LOPRESSOR) 100 MG tablet Take 0.5 tablets (50 mg total) by mouth 2 (two) times daily. 08/16/19   Emily Filbert, MD  rivaroxaban (XARELTO) 20 MG TABS tablet Take 1 tablet (20 mg total) by mouth daily with supper. 08/16/19   Emily Filbert, MD  rosuvastatin (CRESTOR) 20 MG tablet  Take 1 tablet (20 mg total) by mouth daily. 08/16/19   Emily Filbert, MD  sildenafil (VIAGRA) 25 MG tablet Take 1 tablet (25 mg total) by mouth daily as needed for erectile dysfunction. 01/22/15   Iran Ouch, MD    Allergies Patient has no known allergies.  Family History  Problem Relation Age of Onset  . Coronary artery disease Father 35  . Diabetes Mother   . Diabetes Sister   . Diabetes Brother   . Diabetes Maternal Grandmother   . Diabetes Sister   . Diabetes Sister     Social History Social History   Tobacco Use  . Smoking status: Current Every Day Smoker    Packs/day: 0.50    Years: 30.00    Pack years: 15.00    Types: Cigarettes  . Smokeless tobacco: Never Used  Substance Use Topics  . Alcohol use: Yes  . Drug use: No    Review of Systems Constitutional: No fever/chills Eyes: No visual changes. ENT: No sore throat. Cardiovascular: +chest pain. Respiratory: Denies shortness of breath. Gastrointestinal: No abdominal pain.  No nausea, no vomiting.  No diarrhea.  No constipation. Genitourinary: Negative for dysuria. Musculoskeletal: Negative for neck pain.  Negative for back pain. Integumentary: Negative for rash. Neurological: Negative for headaches, focal weakness or numbness.   ____________________________________________   PHYSICAL EXAM:  VITAL SIGNS: ED Triage Vitals  Enc Vitals Group     BP 10/06/19 0126 111/72     Pulse Rate 10/06/19 0126 77     Resp 10/06/19 0126 17     Temp 10/06/19 0126 98 F (36.7 C)     Temp Source 10/06/19 0126 Oral     SpO2 10/06/19 0119 96 %     Weight 10/06/19 0122 68 kg (150 lb)     Height 10/06/19 0122 1.676 m (5\' 6" )     Head Circumference --      Peak Flow --      Pain Score 10/06/19 0122 8     Pain Loc --      Pain Edu? --      Excl. in GC? --     Constitutional: Alert and oriented.  No acute distress at this time. Eyes: Conjunctivae are normal.  Head: Atraumatic. Nose: No  congestion/rhinnorhea. Mouth/Throat: Patient is wearing a mask. Neck: No stridor.  No meningeal signs.   Cardiovascular: Normal rate, regular rhythm. Good peripheral circulation. Grossly normal heart sounds. Respiratory: Normal respiratory effort.  No retractions. Gastrointestinal: Soft and nontender. No distention.  Musculoskeletal: No lower extremity tenderness nor edema. No gross deformities of extremities. Neurologic:  Normal speech and language. No gross focal neurologic deficits are appreciated.  Skin:  Skin is warm, dry and intact. Psychiatric:  Mood and affect are normal. Speech and behavior are normal.  ____________________________________________   LABS (all labs ordered are listed, but only abnormal results are displayed)  Labs Reviewed  ETHANOL - Abnormal; Notable for the following components:      Result Value   Alcohol, Ethyl (B) 99 (*)    All other components within normal limits  BASIC METABOLIC PANEL - Abnormal; Notable for the following components:   Calcium 8.7 (*)    All other components within normal limits  TROPONIN I (HIGH SENSITIVITY) - Abnormal; Notable for the following components:   Troponin I (High Sensitivity) 42 (*)    All other components within normal limits  TROPONIN I (HIGH SENSITIVITY) - Abnormal; Notable for the following components:   Troponin I (High Sensitivity) 46 (*)    All other components within normal limits  CBC  LIPASE, BLOOD  HEPATIC FUNCTION PANEL  URINE DRUG SCREEN, QUALITATIVE ([facility] ONLY)   ____________________________________________  EKG  ED ECG REPORT I, Hinda Kehr, the attending physician, personally viewed and interpreted this ECG.  Date: 10/06/2019 EKG Time: 1:30 AM Rate: 80 Rhythm: normal sinus rhythm QRS Axis: normal Intervals: normal ST/T Wave abnormalities: T wave inversions in leads I, II, V4, V5, and V6.  There is ST segment elevation in leads V1 and V2.  However, the EKG is essentially unchanged from the  last EKG from 4 days ago. Narrative Interpretation: no definitive evidence of acute ischemia; does not meet STEMI criteria.  Similar morphology to prior EKG from 4 days ago.   ____________________________________________  RADIOLOGY I, Hinda Kehr, personally viewed and evaluated these images (plain radiographs) as part of my medical decision making, as well as reviewing the written report by the radiologist.  ED MD interpretation: No acute abnormalities identified on chest x-ray  Official radiology report(s): DG Chest 2 View  Result Date: 10/06/2019 CLINICAL DATA:  Chest pain began 2 hours prior to admission EXAM: CHEST - 2 VIEW COMPARISON:  Radiograph 08/16/2019 FINDINGS: No consolidation, features of edema, pneumothorax, or effusion. Pulmonary vascularity is normally distributed. The cardiomediastinal contours are unremarkable. No acute osseous or soft tissue abnormality. Remote fifth through seventh posterolateral right rib deformities. Degenerative changes are present in the imaged spine and shoulders. Vascular calcium at the level of the right carotid bifurcation. IMPRESSION: 1. No acute cardiopulmonary abnormality. Electronically Signed   By: Lovena Le M.D.   On: 10/06/2019 01:58    ____________________________________________   PROCEDURES   Procedure(s) performed (including Critical Care):  .1-3 Lead EKG Interpretation Performed by: Hinda Kehr, MD Authorized by: Hinda Kehr, MD     Interpretation: normal     ECG rate:  74   ECG rate assessment: normal     Rhythm: sinus rhythm     Ectopy: none     Conduction: normal       ____________________________________________   INITIAL IMPRESSION / MDM / ASSESSMENT AND PLAN / ED COURSE  As part of my medical decision making, I reviewed the following data within the [facility] notes reviewed and incorporated, Labs reviewed , EKG interpreted , Old chart reviewed, Radiograph reviewed , Notes from  prior ED visits and [place] Controlled Substance Database   Differential diagnosis includes, but is not limited to, ACS, stable angina, musculoskeletal pain, pneumonia, pneumothorax, AAS, substance-induced chest pain.  The patient's EKG has an abnormal appearance but it is unchanged from prior and not indicative of acute ischemia.  By the time he came to the emergency department his  chest pain had gone away.  He is intoxicated with an ethanol level of about 99 but was clinically sober and able to give me a reasonable history and description of his symptoms.  He has a normal hepatic function tests and lipase and CBC.  Basic metabolic panel is also within normal limits.  Patient's initial troponin was 42 and his repeat was 46.  This is elevated but looking back at his prior troponins he is frequently elevated in the 20s range.  Given that he is asymptomatic with no more chest pain for more than 4 hours, has no significant delta in the troponin test, and that he has a history of atrial fibrillation and he has been feeling palpitations, I do not believe he is having ACS nor would he be a good candidate for emergent cardiology intervention.  And I discussed this and he is comfortable with the plan for close follow-up with Dr. Kirke Corin.  I sent a message through [facility] to Dr. Renato Gails and ask him to reach out to his hospital staff and see if they could contact the patient and schedule the next available follow-up appointment and the patient knows to do so as well.  I gave him a full dose aspirin and he is comfortable with the plan for discharge and is pain-free at the time of my predischarge assessment.  The patient was on the cardiac monitor to evaluate for evidence of arrhythmia and/or significant heart rate changes.         ____________________________________________  FINAL CLINICAL IMPRESSION(S) / ED DIAGNOSES  Final diagnoses:  Chest pain, unspecified type  Alcoholic intoxication with complication (HCC)    Elevated troponin level     MEDICATIONS GIVEN DURING THIS VISIT:  Medications  aspirin chewable tablet 324 mg (324 mg Oral Given 10/06/19 0508)     ED Discharge Orders    None      *Please note:  Darren Rogers was evaluated in Emergency Department on 10/06/2019 for the symptoms described in the history of present illness. He was evaluated in the context of the global COVID-19 pandemic, which necessitated consideration that the patient might be at risk for infection with the SARS-CoV-2 virus that causes COVID-19. Institutional protocols and algorithms that pertain to the evaluation of patients at risk for COVID-19 are in a state of rapid change based on information released by regulatory bodies including the CDC and federal and state organizations. These policies and algorithms were followed during the patient's care in the ED.  Some ED evaluations and interventions may be delayed as a result of limited staffing during the pandemic.*  Note:  This document was prepared using Dragon voice recognition software and may include unintentional dictation errors.   Loleta Rose, MD 10/06/19 0017    Loleta Rose, MD 10/06/19 [phone]

## 2019-10-06 NOTE — ED Notes (Signed)
Pt given a sandwich tray and Ginger Ale at this time per verbal okay by Dr York Cerise.

## 2019-10-06 NOTE — Discharge Instructions (Signed)

## 2019-10-13 ENCOUNTER — Ambulatory Visit: Payer: Self-pay | Admitting: Family

## 2019-10-13 NOTE — Progress Notes (Deleted)
Office Visit    Patient Name: Darren Rogers Date of Encounter: 10/13/2019  Primary Care Provider:  Patient, No Pcp Per Primary Cardiologist:  Lorine Bears, MD Electrophysiologist:  None   Chief Complaint    Darren Rogers is a 64 y.o. male with a hx of *** presents today for follow up after ED visit for chest pain.    Past Medical History    Past Medical History:  Diagnosis Date  . Claudication (HCC)    a. 06/2015 ABI: R - 0.73, L - 0.73. 30-49% bilat SFA stenosis. 50-74% R Profunda stenosis; b. 01/2017 ABI: R 0.89, L 0.88, TBI R 0.65, L 1.0.  . Erectile dysfunction   . Essential hypertension   . History of echocardiogram    a. 03/29/2014: EF 55-60%, mild LVH, normal RVSP;  b. 05/2015 Echo: EF 60-65%, no rwma, triv AI, mild MR, mildly dil LA.  Marland Kitchen Hyperlipidemia   . Non-obstructive CAD    a. 01/27/2014 Cath: LM nl, LAD mild diff dz w/o obs, LCx no sig obs - scattered 20-30% mLCx, RCA no obs dz, EF 70%; b. 06/2015 MV; EF 60%, no nischemia.  Marland Kitchen PAF (paroxysmal atrial fibrillation) (HCC)    a. Pt says Dx >82yrs ago w/ ? RFCA @ [facility];  b. Recurrent 12/2013;  b. Rx Flecainide and Xarelto-->Intermittent compliance.  Marland Kitchen PVC's (premature ventricular contractions)    a. 05/2017 Zio: 4000 PVCs in 48 hrs (2%). Brief run of SVT.  . Tobacco abuse    a. ongoing - 1 ppd.   Past Surgical History:  Procedure Laterality Date  . CARDIAC CATHETERIZATION    . [facility] CAT[facility]TERIZATION     [facility]  . LEFT HEART CATH Right 01/27/2014   Procedure: LEFT HEART CATH;  Surgeon: Micheline Chapman, MD;  Location: [facility] CATH LAB;  Service: Cardiovascular;  Laterality: Right;    Allergies  No Known Allergies  History of Present Illness    Darren Rogers is a 64 y.o. male with a hx of PAF, PAD, HTN, hypertensive heart disease with abnormal EKG, tobacco use, alcohol abuse.  He was last seen 10/2017 by Dr. Kirke Corin.  Previous cardiac catheterization August 2015 with mild nonobstructive CAD.  Lexiscan 2017 low risk study.   He is known PAD with mildly reduced ABI bilaterally.  Duplex with showing diffuse nonobstructive disease.  Noted history of noncompliance with medications.  Recent ED visits:  05/04/19 - Alcohol abuse - requested etoh detox, transferred to [facility]  08/16/19 - Nonspecific chest pain/Atrial fibrillation - In setting of being out of Metoprolol. Resolved with metoprolol, IV Cardizem. Flecainide was resumed.   09/29/19 - Alcohol dependence - Transferred to [facility]. Also noted use of cocaine 2 days prior. EKG with stable inferolateral TWI. Troponin negative.   10/06/19 - Chest pain - Acute onset sharp pain associated with nausea. Reported intermittent compliance with medications and that it felt like atrial fibrillation. HS-TroponinI of 42 and 46.   8 years sober and starting drinking again approx 1.5 years ago.   EKGs/Labs/Other Studies Reviewed:   The following studies were reviewed today: ***  EKG:  EKG is *** ordered today.  The ekg ordered today demonstrates ***  Recent Labs: 10/06/2019: ALT 23; BUN 12; Creatinine, Ser 1.13; Hemoglobin 15.3; Platelets 236; Potassium 3.8; Sodium 137  Recent Lipid Panel    Component Value Date/Time   CHOL 151 11/04/2017 0900   CHOL 234 (H) 12/06/2011 0132   TRIG 115 11/04/2017 0900   TRIG 381 (H) 12/06/2011 0132  HDL 42 11/04/2017 0900   HDL 34 (L) 12/06/2011 0132   CHOLHDL 3.6 11/04/2017 0900   CHOLHDL 6.5 11/17/2014 0427   VLDL 29 11/17/2014 0427   VLDL 76 (H) 12/06/2011 0132   LDLCALC 86 11/04/2017 0900   LDLCALC 124 (H) 12/06/2011 0132    Home Medications   No outpatient medications have been marked as taking for the 10/13/19 encounter (Appointment) with Loel Dubonnet, NP.      Review of Systems    ***   ROS All other systems reviewed and are otherwise negative except as noted above.  Physical Exam    VS:  There were no vitals taken for this visit. , BMI There is no height or weight on file to calculate BMI. GEN: Well nourished, well  developed, in no acute distress. HEENT: normal. Neck: Supple, no JVD, carotid bruits, or masses. Cardiac: ***RRR, no murmurs, rubs, or gallops. No clubbing, cyanosis, edema.  ***Radials/DP/PT 2+ and equal bilaterally.  Respiratory:  ***Respirations regular and unlabored, clear to auscultation bilaterally. GI: Soft, nontender, nondistended, BS + x 4. MS: No deformity or atrophy. Skin: Warm and dry, no rash. Neuro:  Strength and sensation are intact. Psych: Normal affect.  Accessory Clinical Findings    ECG personally reviewed by me today - *** - no acute changes.  Assessment & Plan    1. ***  Disposition: Follow up {follow up:15908} with ***   Loel Dubonnet, NP 10/13/2019, 12:56 PM

## 2019-10-16 ENCOUNTER — Encounter: Payer: Self-pay | Admitting: Family

## 2019-10-19 ENCOUNTER — Emergency Department
Admission: EM | Admit: 2019-10-19 | Discharge: 2019-10-20 | Disposition: A | Discharge status: Home / Self Care | Payer: Self-pay | Attending: Student | Admitting: Student

## 2019-10-19 ENCOUNTER — Other Ambulatory Visit: Payer: Self-pay

## 2019-10-19 ENCOUNTER — Emergency Department: Payer: Self-pay

## 2019-10-19 DIAGNOSIS — Z7901 Long term (current) use of anticoagulants: Secondary | ICD-10-CM | POA: Insufficient documentation

## 2019-10-19 DIAGNOSIS — F329 Major depressive disorder, single episode, unspecified: Secondary | ICD-10-CM | POA: Insufficient documentation

## 2019-10-19 DIAGNOSIS — K7010 Alcoholic hepatitis without ascites: Secondary | ICD-10-CM | POA: Insufficient documentation

## 2019-10-19 DIAGNOSIS — F1010 Alcohol abuse, uncomplicated: Secondary | ICD-10-CM | POA: Diagnosis present

## 2019-10-19 DIAGNOSIS — Z7982 Long term (current) use of aspirin: Secondary | ICD-10-CM | POA: Insufficient documentation

## 2019-10-19 DIAGNOSIS — I10 Essential (primary) hypertension: Secondary | ICD-10-CM | POA: Insufficient documentation

## 2019-10-19 DIAGNOSIS — R45851 Suicidal ideations: Secondary | ICD-10-CM | POA: Insufficient documentation

## 2019-10-19 DIAGNOSIS — F17210 Nicotine dependence, cigarettes, uncomplicated: Secondary | ICD-10-CM | POA: Insufficient documentation

## 2019-10-19 DIAGNOSIS — Y907 Blood alcohol level of 200-239 mg/100 ml: Secondary | ICD-10-CM | POA: Insufficient documentation

## 2019-10-19 DIAGNOSIS — Z79899 Other long term (current) drug therapy: Secondary | ICD-10-CM | POA: Insufficient documentation

## 2019-10-19 DIAGNOSIS — F32A Depression, unspecified: Secondary | ICD-10-CM | POA: Diagnosis present

## 2019-10-19 DIAGNOSIS — I2510 Atherosclerotic heart disease of native coronary artery without angina pectoris: Secondary | ICD-10-CM | POA: Insufficient documentation

## 2019-10-19 DIAGNOSIS — Z87891 Personal history of nicotine dependence: Secondary | ICD-10-CM

## 2019-10-19 DIAGNOSIS — Z20822 Contact with and (suspected) exposure to covid-19: Secondary | ICD-10-CM | POA: Insufficient documentation

## 2019-10-19 DIAGNOSIS — Z720 Tobacco use: Secondary | ICD-10-CM | POA: Diagnosis present

## 2019-10-19 LAB — COMPREHENSIVE METABOLIC PANEL
ALT: 295 U/L — ABNORMAL HIGH (ref 0–44)
AST: 509 U/L — ABNORMAL HIGH (ref 15–41)
Albumin: 4.3 g/dL (ref 3.5–5.0)
Alkaline Phosphatase: 127 U/L — ABNORMAL HIGH (ref 38–126)
Anion gap: 14 (ref 5–15)
BUN: 9 mg/dL (ref 8–23)
CO2: 22 mmol/L (ref 22–32)
Calcium: 8.7 mg/dL — ABNORMAL LOW (ref 8.9–10.3)
Chloride: 99 mmol/L (ref 98–111)
Creatinine, Ser: 0.84 mg/dL (ref 0.61–1.24)
GFR calc Af Amer: >60 mL/min (ref >60)
GFR calc non Af Amer: >60 mL/min (ref >60)
Glucose, Bld: 83 mg/dL (ref 70–99)
Potassium: 4.5 mmol/L (ref 3.5–5.1)
Sodium: 135 mmol/L (ref 135–145)
Total Bilirubin: 0.8 mg/dL (ref 0.3–1.2)
Total Protein: 7.8 g/dL (ref 6.5–8.1)

## 2019-10-19 LAB — CBC
HCT: 42.8 % (ref 39.0–52.0)
Hemoglobin: 15 g/dL (ref 13.0–17.0)
MCH: 31.8 pg (ref 26.0–34.0)
MCHC: 35 g/dL (ref 30.0–36.0)
MCV: 90.7 fL (ref 80.0–100.0)
Platelets: 241 10*3/uL (ref 150–400)
RBC: 4.72 MIL/uL (ref 4.22–5.81)
RDW: 14.7 % (ref 11.5–15.5)
WBC: 4.4 10*3/uL (ref 4.0–10.5)
nRBC: 0 % (ref 0.0–0.2)

## 2019-10-19 LAB — PROTIME-INR
INR: 0.9 (ref 0.8–1.2)
Prothrombin Time: 11.3 seconds — ABNORMAL LOW (ref 11.4–15.2)

## 2019-10-19 LAB — LIPASE, BLOOD: Lipase: 42 U/L (ref 11–51)

## 2019-10-19 LAB — ACETAMINOPHEN LEVEL: Acetaminophen (Tylenol), Serum: <10 ug/mL — ABNORMAL LOW (ref 10–30)

## 2019-10-19 LAB — TROPONIN I (HIGH SENSITIVITY)
Troponin I (High Sensitivity): 20 ng/L — ABNORMAL HIGH (ref <18)
Troponin I (High Sensitivity): 16 ng/L (ref <18)

## 2019-10-19 LAB — ETHANOL: Alcohol, Ethyl (B): 209 mg/dL — ABNORMAL HIGH (ref <10)

## 2019-10-19 LAB — AMMONIA: Ammonia: 34 umol/L (ref 9–35)

## 2019-10-19 LAB — SARS CORONAVIRUS 2 BY RT PCR (HOSPITAL ORDER, PERFORMED IN ~~LOC~~ HOSPITAL LAB): SARS Coronavirus 2: NEGATIVE

## 2019-10-19 MED ORDER — THIAMINE HCL 100 MG PO TABS
100.0000 mg | ORAL_TABLET | Freq: Every day | ORAL | Status: DC
  Administered 2019-10-20: 100 mg via ORAL
  Filled 2019-10-19: qty 1

## 2019-10-19 MED ORDER — ROSUVASTATIN CALCIUM 20 MG PO TABS
20.0000 mg | ORAL_TABLET | Freq: Every day | ORAL | Status: DC
  Filled 2019-10-19: qty 1

## 2019-10-19 MED ORDER — THIAMINE HCL 100 MG/ML IJ SOLN
100.0000 mg | Freq: Once | INTRAMUSCULAR | Status: AC
  Administered 2019-10-19: 100 mg via INTRAVENOUS
  Filled 2019-10-19: qty 2

## 2019-10-19 MED ORDER — LORAZEPAM 2 MG/ML IJ SOLN
0.0000 mg | Freq: Four times a day (QID) | INTRAMUSCULAR | Status: DC
  Administered 2019-10-19 (×2): 1 mg via INTRAVENOUS
  Filled 2019-10-19 (×2): qty 1

## 2019-10-19 MED ORDER — ASPIRIN 81 MG PO CHEW
324.0000 mg | CHEWABLE_TABLET | Freq: Once | ORAL | Status: AC
  Administered 2019-10-19: 324 mg via ORAL
  Filled 2019-10-19: qty 4

## 2019-10-19 MED ORDER — ONDANSETRON HCL 4 MG/2ML IJ SOLN
4.0000 mg | Freq: Once | INTRAMUSCULAR | Status: DC
  Filled 2019-10-19: qty 2

## 2019-10-19 MED ORDER — ASPIRIN EC 81 MG PO TBEC: 81.0000 mg | DELAYED_RELEASE_TABLET | Freq: Every day | ORAL | Status: DC

## 2019-10-19 MED ORDER — LORAZEPAM 2 MG PO TABS: 0.0000 mg | ORAL_TABLET | Freq: Four times a day (QID) | ORAL | Status: DC

## 2019-10-19 MED ORDER — LORAZEPAM 2 MG PO TABS: 0.0000 mg | ORAL_TABLET | Freq: Two times a day (BID) | ORAL | Status: DC

## 2019-10-19 MED ORDER — RIVAROXABAN 20 MG PO TABS: 20.0000 mg | ORAL_TABLET | Freq: Every day | ORAL | Status: DC

## 2019-10-19 MED ORDER — FLECAINIDE ACETATE 100 MG PO TABS
100.0000 mg | ORAL_TABLET | Freq: Two times a day (BID) | ORAL | Status: DC
  Administered 2019-10-20: 100 mg via ORAL
  Filled 2019-10-19 (×4): qty 1

## 2019-10-19 MED ORDER — ASPIRIN EC 81 MG PO TBEC
81.0000 mg | DELAYED_RELEASE_TABLET | Freq: Every day | ORAL | Status: DC
  Administered 2019-10-20: 81 mg via ORAL
  Filled 2019-10-19: qty 1

## 2019-10-19 MED ORDER — FOLIC ACID 5 MG/ML IJ SOLN
1.0000 mg | Freq: Once | INTRAMUSCULAR | Status: AC
  Administered 2019-10-19: 1 mg via INTRAVENOUS
  Filled 2019-10-19: qty 0.2

## 2019-10-19 MED ORDER — LORAZEPAM 2 MG/ML IJ SOLN: 0.0000 mg | Freq: Two times a day (BID) | INTRAMUSCULAR | Status: DC

## 2019-10-19 MED ORDER — METOPROLOL TARTRATE 25 MG PO TABS
50.0000 mg | ORAL_TABLET | Freq: Two times a day (BID) | ORAL | Status: DC
  Administered 2019-10-19 – 2019-10-20 (×2): 50 mg via ORAL
  Filled 2019-10-19: qty 2
  Filled 2019-10-19: qty 1

## 2019-10-19 MED ORDER — SODIUM CHLORIDE 0.9 % IV BOLUS
1000.0000 mL | Freq: Once | INTRAVENOUS | Status: AC
  Administered 2019-10-19: 1000 mL via INTRAVENOUS

## 2019-10-19 MED ORDER — THIAMINE HCL 100 MG/ML IJ SOLN
100.0000 mg | Freq: Every day | INTRAMUSCULAR | Status: DC
  Administered 2019-10-19: 100 mg via INTRAVENOUS
  Filled 2019-10-19: qty 2

## 2019-10-19 NOTE — ED Notes (Signed)
Pt refused offered meal tray.

## 2019-10-19 NOTE — ED Provider Notes (Signed)
[facility] Emergency Department Provider Note  ____________________________________________   First MD Initiated Contact with Patient 10/19/19 1318     (approximate)  I have reviewed the triage vital signs and the nursing notes.   HISTORY  Chief Complaint Psychiatric Evaluation    HPI Darren Rogers is a 64 y.o. male with past medical history of coronary disease, chronic alcoholism, hyperlipidemia, A. fib, here with suicidal ideation. The patient arrives under IVC from his girlfriend. Reportedly, he has recently relapsed in alcohol, and has been drinking for the last 3 months. He endorses worsening depression, dysphoria, and thoughts of hopelessness. He has been drinking extremely heavily. He states that over the last several days, he has felt like he has not wanted to live. He reportedly told his significant other that he did not want to live. He stated that if he had a gun, he would shoot himself. She subsequently filled out paperwork, and patient was brought to the ED. On my assessment, the patient admits to drinking, but denies any other complaints. He does endorse depressed mood. He is somewhat evasive when asked about suicidal ideation. Denies any other complaints. Denies any abdominal pain, nausea, or vomiting. Denies any fevers or chills.       Past Medical History:  Diagnosis Date  . Claudication (HCC)    a. 06/2015 ABI: R - 0.73, L - 0.73. 30-49% bilat SFA stenosis. 50-74% R Profunda stenosis; b. 01/2017 ABI: R 0.89, L 0.88, TBI R 0.65, L 1.0.  . Erectile dysfunction   . Essential hypertension   . History of echocardiogram    a. 03/29/2014: EF 55-60%, mild LVH, normal RVSP;  b. 05/2015 Echo: EF 60-65%, no rwma, triv AI, mild MR, mildly dil LA.  Marland Kitchen Hyperlipidemia   . Non-obstructive CAD    a. 01/27/2014 Cath: LM nl, LAD mild diff dz w/o obs, LCx no sig obs - scattered 20-30% mLCx, RCA no obs dz, EF 70%; b. 06/2015 MV; EF 60%, no nischemia.  Marland Kitchen PAF  (paroxysmal atrial fibrillation) (HCC)    a. Pt says Dx >4yrs ago w/ ? RFCA @ [facility];  b. Recurrent 12/2013;  b. Rx Flecainide and Xarelto-->Intermittent compliance.  Marland Kitchen PVC's (premature ventricular contractions)    a. 05/2017 Zio: 4000 PVCs in 48 hrs (2%). Brief run of SVT.  . Tobacco abuse    a. ongoing - 1 ppd.    Patient Active Problem List   Diagnosis Date Noted  . A-fib (HCC) 01/15/2017  . Personal history of tobacco use, presenting hazards to health 08/17/2016  . BPH associated with nocturia 07/30/2016  . IFG (impaired fasting glucose) 07/30/2016  . Essential hypertension   . Claudication (HCC)   . 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 CAD in native artery 04/11/2014  . Paroxysmal atrial fibrillation (HCC)   . Tobacco abuse   . Hyperlipidemia   . Atrial fibrillation with rapid ventricular response (HCC) 01/27/2014    Past Surgical History:  Procedure Laterality Date  . CARDIAC CATHETERIZATION    . CARD[facility]IZATION     [facility]  . LEFT HEART CATH Right 01/27/2014   Procedure: LEFT HEART CATH;  Surgeon: Micheline Chapman, MD;  Location: [facility] CATH LAB;  Service: Cardiovascular;  Laterality: Right;    Prior to Admission medications   Medication Sig Start Date End Date Taking? Authorizing Provider  aspirin EC 81 MG tablet Take 1 tablet (81 mg total) by mouth daily. 08/16/19   Emily Filbert, MD  flecainide [facility]) 100  MG tablet Take 1 tablet (100 mg total) by mouth every 12 (twelve) hours. 08/16/19   Emily Filbert, MD  metoprolol tartrate (LOPRESSOR) 100 MG tablet Take 0.5 tablets (50 mg total) by mouth 2 (two) times daily. 08/16/19   Emily Filbert, MD  rivaroxaban (XARELTO) 20 MG TABS tablet Take 1 tablet (20 mg total) by mouth daily with supper. 08/16/19   Emily Filbert, MD  rosuvastatin (CRESTOR) 20 MG tablet Take 1 tablet (20 mg total) by mouth daily. 08/16/19   Emily Filbert, MD  sildenafil (VIAGRA) 25 MG tablet Take 1 tablet (25 mg  total) by mouth daily as needed for erectile dysfunction. 01/22/15   Iran Ouch, MD    Allergies Patient has no known allergies.  Family History  Problem Relation Age of Onset  . Coronary artery disease Father 64  . Diabetes Mother   . Diabetes Sister   . Diabetes Brother   . Diabetes Maternal Grandmother   . Diabetes Sister   . Diabetes Sister     Social History Social History   Tobacco Use  . Smoking status: Current Every Day Smoker    Packs/day: 0.50    Years: 30.00    Pack years: 15.00    Types: Cigarettes  . Smokeless tobacco: Never Used  Substance Use Topics  . Alcohol use: Yes  . Drug use: No    Review of Systems  Review of Systems  Constitutional: Positive for fatigue. Negative for chills and fever.  HENT: Negative for sore throat.   Respiratory: Negative for shortness of breath.   Cardiovascular: Negative for chest pain.  Gastrointestinal: Negative for abdominal pain.  Genitourinary: Negative for flank pain.  Musculoskeletal: Negative for neck pain.  Skin: Negative for rash and wound.  Allergic/Immunologic: Negative for immunocompromised state.  Neurological: Positive for weakness. Negative for numbness.  Hematological: Does not bruise/bleed easily.  Psychiatric/Behavioral: Positive for decreased concentration, dysphoric mood and suicidal ideas.  All other systems reviewed and are negative.    ____________________________________________  PHYSICAL EXAM:      VITAL SIGNS: ED Triage Vitals  Enc Vitals Group     BP 10/19/19 1209 (!) 115/99     Pulse Rate 10/19/19 1209 (!) 104     Resp 10/19/19 1209 18     Temp 10/19/19 1209 98.2 F (36.8 C)     Temp Source 10/19/19 1209 Oral     SpO2 10/19/19 1209 92 %     Weight 10/19/19 1208 149 lb 14.6 oz (68 kg)     Height 10/19/19 1208 5\' 6"  (1.676 m)     Head Circumference --      Peak Flow --      Pain Score 10/19/19 1208 0     Pain Loc --      Pain Edu? --      Excl. in GC? --      Physical  Exam Vitals and nursing note reviewed.  Constitutional:      General: He is not in acute distress.    Appearance: He is well-developed.  HENT:     Head: Normocephalic and atraumatic.  Eyes:     Conjunctiva/sclera: Conjunctivae normal.  Cardiovascular:     Rate and Rhythm: Normal rate and regular rhythm.     Heart sounds: Normal heart sounds. No murmur. No friction rub.  Pulmonary:     Effort: Pulmonary effort is normal. No respiratory distress.     Breath sounds: Normal breath sounds. No wheezing or  rales.  Abdominal:     General: There is no distension.     Palpations: Abdomen is soft.     Tenderness: There is no abdominal tenderness.  Musculoskeletal:     Cervical back: Neck supple.  Skin:    General: Skin is warm.     Capillary Refill: Capillary refill takes less than 2 seconds.     Findings: No rash.  Neurological:     Mental Status: He is alert and oriented to person, place, and time.     Motor: No abnormal muscle tone.       ____________________________________________   LABS (all labs ordered are listed, but only abnormal results are displayed)  Labs Reviewed  COMPREHENSIVE METABOLIC PANEL - Abnormal; Notable for the following components:      Result Value   Calcium 8.7 (*)    AST 509 (*)    ALT 295 (*)    Alkaline Phosphatase 127 (*)    All other components within normal limits  ETHANOL - Abnormal; Notable for the following components:   Alcohol, Ethyl (B) 209 (*)    All other components within normal limits  PROTIME-INR - Abnormal; Notable for the following components:   Prothrombin Time 11.3 (*)    All other components within normal limits  ACETAMINOPHEN LEVEL - Abnormal; Notable for the following components:   Acetaminophen (Tylenol), Serum <10 (*)    All other components within normal limits  TROPONIN I (HIGH SENSITIVITY) - Abnormal; Notable for the following components:   Troponin I (High Sensitivity) 20 (*)    All other components within normal  limits  SARS CORONAVIRUS 2 BY RT PCR (HOSPITAL ORDER, [place] [facility])  CBC  AMMONIA  LIPASE, BLOOD  URINE DRUG SCREEN, QUALITATIVE ([facility] ONLY)  COMPREHENSIVE METABOLIC PANEL  TROPONIN I (HIGH SENSITIVITY)    ____________________________________________  EKG: Normal sinus rhythm, VR 93. PR 148, QRS 76, QTc 422. No acute ST elevations or depressions. TWI noted diffusely, primarily in inferior and lateral leads. However, no significant change when compared to prior. ________________________________________  RADIOLOGY All imaging, including plain films, CT scans, and ultrasounds, independently reviewed by me, and interpretations confirmed via formal radiology reads.  ED MD interpretation:   None  Official radiology report(s): DG Chest Portable 1 View  Result Date: 10/19/2019 CLINICAL DATA:  Chest pain EXAM: PORTABLE CHEST 1 VIEW COMPARISON:  Oct 06, 2019 FINDINGS: There is no edema or airspace opacity. Heart size and pulmonary vascularity are normal. No adenopathy. No pneumothorax. No bone lesions. IMPRESSION: Lungs clear.  Cardiac silhouette within normal limits. Electronically Signed   By: Lowella Grip III M.D.   On: 10/19/2019 15:44    ____________________________________________  PROCEDURES   Procedure(s) performed (including Critical Care):  .1-3 Lead EKG Interpretation Performed by: Duffy Bruce, MD Authorized by: Duffy Bruce, MD     Interpretation: normal     ECG rate:  80-100   ECG rate assessment: normal     Rhythm: sinus rhythm     Ectopy: PVCs     Conduction: normal   Comments:     Indication: Chest pain    ____________________________________________  INITIAL IMPRESSION / MDM / [place] / ED COURSE  As part of my medical decision making, I reviewed the following data within the [place] notes reviewed and incorporated, Old chart reviewed, Notes from prior ED visits, and [place] Controlled  Substance Database       *[place]wood was evaluated in  Emergency Department on 10/19/2019 for the symptoms described in the history of present illness. He was evaluated in the context of the global COVID-19 pandemic, which necessitated consideration that the patient might be at risk for infection with the SARS-CoV-2 virus that causes COVID-19. Institutional protocols and algorithms that pertain to the evaluation of patients at risk for COVID-19 are in a state of rapid change based on information released by regulatory bodies including the CDC and federal and state organizations. These policies and algorithms were followed during the patient's care in the ED.  Some ED evaluations and interventions may be delayed as a result of limited staffing during the pandemic.*     Medical Decision Making: 64 year old male here with depression and suicidal ideation. The patient endorses recent relapse and alcohol use and arrives under IVC. Agree with IVC and this was continued. Lab work reviewed, and is consistent with likely ongoing alcoholic hepatitis. He denies any nausea, vomiting, abdominal pain, and INR is normal with normal bilirubin. Suspect this can be managed symptomatically/supportively with fluids, abstinence. Will plan to repeat in the morning. Otherwise, will consult TTS. He did complain of some moderate chest pain transiently. Unclear if this is GI related. EKG shows chronic T wave inversions, initial troponin negative. Will plan to trend, clear for TTS/Psych if negative.  ____________________________________________  FINAL CLINICAL IMPRESSION(S) / ED DIAGNOSES  Final diagnoses:  Alcoholic hepatitis without ascites  Suicidal ideation     MEDICATIONS GIVEN DURING THIS VISIT:  Medications  LORazepam (ATIVAN) injection 0-4 mg (1 mg Intravenous Given 10/19/19 1650)    Or  LORazepam (ATIVAN) tablet 0-4 mg ( Oral See Alternative 10/19/19 1650)  LORazepam (ATIVAN) injection 0-4 mg (has no  administration in time range)    Or  LORazepam (ATIVAN) tablet 0-4 mg (has no administration in time range)  thiamine tablet 100 mg ( Oral See Alternative 10/19/19 1652)    Or  thiamine (B-1) injection 100 mg (100 mg Intravenous Given 10/19/19 1652)  flecainide (TAMBOCOR) tablet 100 mg (has no administration in time range)  metoprolol tartrate (LOPRESSOR) tablet 50 mg (has no administration in time range)  rivaroxaban (XARELTO) tablet 20 mg (has no administration in time range)  rosuvastatin (CRESTOR) tablet 20 mg (has no administration in time range)  aspirin EC tablet 81 mg (has no administration in time range)  ondansetron (ZOFRAN) injection 4 mg (has no administration in time range)  sodium chloride 0.9 % bolus 1,000 mL (0 mLs Intravenous Stopped 10/19/19 1553)  thiamine (B-1) injection 100 mg (100 mg Intravenous Given 10/19/19 1423)  folic acid injection 1 mg (1 mg Intravenous Given 10/19/19 1425)  aspirin chewable tablet 324 mg (324 mg Oral Given 10/19/19 1525)     ED Discharge Orders    None       Note:  This document was prepared using Dragon voice recognition software and may include unintentional dictation errors.   Shaune Pollack, MD 10/19/19 [phone]

## 2019-10-19 NOTE — ED Notes (Addendum)
Pt belongings include one pair underwear, one pair jeans, one yellow tshirt, two black socks, two black tennis shoes, one yellow and gray watch, one blue hat. In police lock up folder (also in belongings) includes wallet, phone, one set of keys, and two small pocket knives.

## 2019-10-19 NOTE — ED Triage Notes (Signed)
Pt comes in with officer IVC r/t SI. Pt recently relapsed on alcohol. Pt reports to officer that his girlfriend recently left him and he wants to kill himself. Pt also has some confusion about where he is. Oriented to self only.

## 2019-10-19 NOTE — ED Provider Notes (Addendum)
Repeat troponin improved and downtrending to 16. Had one episode of vomiting managed with Zofran, though suspect this was due to him trying eat a large volume of food rapidly.  As such, feel patient is medically cleared.  TTS and psychiatry consult pending.     Miguel Aschoff., MD 10/19/19 [phone]

## 2019-10-19 NOTE — ED Notes (Signed)
Pt provided w/ sandwich box per pt request and EDP permission.

## 2019-10-19 NOTE — ED Notes (Signed)
Pt reporting chest discomfort and chest pain. EDP made aware and at bedside.

## 2019-10-20 DIAGNOSIS — F1010 Alcohol abuse, uncomplicated: Secondary | ICD-10-CM | POA: Diagnosis present

## 2019-10-20 DIAGNOSIS — F32A Depression, unspecified: Secondary | ICD-10-CM | POA: Diagnosis present

## 2019-10-20 MED ORDER — RIVAROXABAN 20 MG PO TABS
20.0000 mg | ORAL_TABLET | Freq: Every day | ORAL | Status: DC
  Filled 2019-10-20 (×2): qty 1

## 2019-10-20 NOTE — ED Notes (Signed)
Patient state's not hungry at this time please leave it in his room

## 2019-10-20 NOTE — BH Assessment (Signed)
Assessment Note  Darren Rogers is an 64 y.o. male who presents to the ER via law enforcement due to having thoughts of ending his life because of his recent relapse on alcohol. Patient states he was sober for approximately seven years and recently relapse three months ago. Due to his relapse, he's been depressed, having feelings of hopelessness, helplessness and worthlessness.  During the interview, the patient was calm, cooperative and pleasant. He was able to provide the appropriate answers to the questions. Throughout the interview the patient denied SI/HI and AV/H.   Diagnosis: Alcohol Use Disorder  Past Medical History:  Past Medical History:  Diagnosis Date  . Claudication (HCC)    a. 06/2015 ABI: R - 0.73, L - 0.73. 30-49% bilat SFA stenosis. 50-74% R Profunda stenosis; b. 01/2017 ABI: R 0.89, L 0.88, TBI R 0.65, L 1.0.  . Erectile dysfunction   . Essential hypertension   . History of echocardiogram    a. 03/29/2014: EF 55-60%, mild LVH, normal RVSP;  b. 05/2015 Echo: EF 60-65%, no rwma, triv AI, mild MR, mildly dil LA.  Marland Kitchen Hyperlipidemia   . Non-obstructive CAD    a. 01/27/2014 Cath: LM nl, LAD mild diff dz w/o obs, LCx no sig obs - scattered 20-30% mLCx, RCA no obs dz, EF 70%; b. 06/2015 MV; EF 60%, no nischemia.  Marland Kitchen PAF (paroxysmal atrial fibrillation) (HCC)    a. Pt says Dx >13yrs ago w/ ? RFCA @ [facility];  b. Recurrent 12/2013;  b. Rx Flecainide and Xarelto-->Intermittent compliance.  Marland Kitchen PVC's (premature ventricular contractions)    a. 05/2017 Zio: 4000 PVCs in 48 hrs (2%). Brief run of SVT.  . Tobacco abuse    a. ongoing - 1 ppd.    Past Surgical History:  Procedure Laterality Date  . CARDIAC CATHETERIZATION    . [facility]IZATION     [facility]  . LEFT HEART CATH Right 01/27/2014   Procedure: LEFT HEART CATH;  Surgeon: Micheline Chapman, MD;  Location: [facility] CATH LAB;  Service: Cardiovascular;  Laterality: Right;    Family History:  Family History  Problem Relation Age of Onset  .  Coronary artery disease Father 12  . Diabetes Mother   . Diabetes Sister   . Diabetes Brother   . Diabetes Maternal Grandmother   . Diabetes Sister   . Diabetes Sister     Social History:  reports that he has been smoking cigarettes. He has a 15.00 pack-year smoking history. He has never used smokeless tobacco. He reports current alcohol use. He reports that he does not use drugs.  Additional Social History:  Alcohol / Drug Use Pain Medications: See PTA Prescriptions: See PTA Over the Counter: See PTA History of alcohol / drug use?: Yes Longest period of sobriety (when/how long): Seven years Substance #1 Name of Substance 1: Alcohol Substance #2 Name of Substance 2: Cocaine  CIWA: CIWA-Ar BP: (!) 141/77 Pulse Rate: 87 Nausea and Vomiting: no nausea and no vomiting Tactile Disturbances: none Tremor: two Auditory Disturbances: not present Paroxysmal Sweats: no sweat visible Visual Disturbances: very mild sensitivity Anxiety: mildly anxious Headache, Fullness in Head: mild Agitation: somewhat more than normal activity Orientation and Clouding of Sensorium: oriented and can do serial additions CIWA-Ar Total: 7 COWS:    Allergies: No Known Allergies  Home Medications: (Not in a hospital admission)   OB/GYN Status:  No LMP for male patient.  General Assessment Data Location of Assessment: [facility] ED TTS Assessment: In system Is this a Tele  or Face-to-Face Assessment?: Face-to-Face Is this an Initial Assessment or a Re-assessment for this encounter?: Initial Assessment Patient Accompanied by:: N/A Language Other than English: No Living Arrangements: Other (Comment)(Private Residence) What gender do you identify as?: Male Marital status: Single Maiden name: n/a Pregnancy Status: No Living Arrangements: Other relatives Can pt return to current living arrangement?: Yes Admission Status: Involuntary Petitioner: Police Is patient capable of signing voluntary admission?:  No(Under IVC) Referral Source: Risk manager) Insurance type: None  Medical Screening Exam ([place] [facility] ONLY) Medical Exam completed: Yes  Crisis Care Plan Living Arrangements: Other relatives Legal Guardian: Other:(Self) Name of Psychiatrist: None Name of Therapist: None  Education Status Is patient currently in school?: No Is the patient employed, unemployed or receiving disability?: Unemployed  Risk to self with the past 6 months Suicidal Ideation: No Has patient been a risk to self within the past 6 months prior to admission? : No Suicidal Intent: No Has patient had any suicidal intent within the past 6 months prior to admission? : No Is patient at risk for suicide?: No Suicidal Plan?: No Has patient had any suicidal plan within the past 6 months prior to admission? : No Access to Means: No What has been your use of drugs/alcohol within the last 12 months?: Alcohol & Cocaine Previous Attempts/Gestures: No How many times?: 0 Other Self Harm Risks: Alcohol & Cocaine Triggers for Past Attempts: None known Intentional Self Injurious Behavior: None Family Suicide History: No Recent stressful life event(s): Other (Comment) Persecutory voices/beliefs?: No Depression: Yes Depression Symptoms: Isolating, Guilt Substance abuse history and/or treatment for substance abuse?: Yes Suicide prevention information given to non-admitted patients: Not applicable  Risk to Others within the past 6 months Homicidal Ideation: No Does patient have any lifetime risk of violence toward others beyond the six months prior to admission? : No Thoughts of Harm to Others: No Current Homicidal Intent: No Current Homicidal Plan: No Access to Homicidal Means: No Identified Victim: Reports of none History of harm to others?: No Assessment of Violence: None Noted Violent Behavior Description: Reports of none Does patient have access to weapons?: No Criminal Charges Pending?: No Does patient  have a court date: No Is patient on probation?: No  Psychosis Hallucinations: None noted Delusions: None noted  Mental Status Report Appearance/Hygiene: In scrubs Eye Contact: Fair Motor Activity: Freedom of movement, Unremarkable Speech: Logical/coherent Level of Consciousness: Alert Mood: Anxious, Sad, Pleasant Affect: Appropriate to circumstance, Sad Anxiety Level: Minimal Thought Processes: Coherent, Relevant Judgement: Unimpaired Orientation: Person, Place, Time, Situation, Appropriate for developmental age Obsessive Compulsive Thoughts/Behaviors: Minimal  Cognitive Functioning Concentration: Normal Memory: Recent Intact, Remote Intact Is patient IDD: No Insight: Fair Impulse Control: Fair Appetite: Good Have you had any weight changes? : No Change Sleep: No Change Total Hours of Sleep: 8 Vegetative Symptoms: None  ADLScreening [facility] Assessment Services) Patient's cognitive ability adequate to safely complete daily activities?: Yes Patient able to express need for assistance with ADLs?: Yes Independently performs ADLs?: Yes (appropriate for developmental age)  Prior Inpatient Therapy Prior Inpatient Therapy: No  Prior Outpatient Therapy Prior Outpatient Therapy: No Does patient have an ACCT team?: No Does patient have Intensive In-House Services?  : No Does patient have [facility] services? : No Does patient have P4CC services?: No  ADL Screening (condition at time of admission) Patient's cognitive ability adequate to safely complete daily activities?: Yes Is the patient deaf or have difficulty hearing?: No Does the patient have difficulty seeing, even when wearing glasses/contacts?: No Does the  patient have difficulty concentrating, remembering, or making decisions?: No Patient able to express need for assistance with ADLs?: Yes Does the patient have difficulty dressing or bathing?: No Independently performs ADLs?: Yes (appropriate for developmental age) Does  the patient have difficulty walking or climbing stairs?: No Weakness of Legs: None Weakness of Arms/Hands: None  Home Assistive Devices/Equipment Home Assistive Devices/Equipment: None  Therapy Consults (therapy consults require a physician order) PT Evaluation Needed: No OT Evalulation Needed: No SLP Evaluation Needed: No Abuse/Neglect Assessment (Assessment to be complete while patient is alone) Abuse/Neglect Assessment Can Be Completed: Yes Physical Abuse: Denies Verbal Abuse: Denies Sexual Abuse: Denies Exploitation of patient/patient's resources: Denies Self-Neglect: Denies Values / Beliefs Cultural Requests During Hospitalization: None Spiritual Requests During Hospitalization: None Consults Spiritual Care Consult Needed: No Transition of Care Team Consult Needed: No Advance Directives (For Healthcare) Does Patient Have a Medical Advance Directive?: No  Child/Adolescent Assessment Running Away Risk: Denies(Patient is an adult )  Disposition:  Disposition Initial Assessment Completed for this Encounter: Yes Patient referred to: RTS  On Site Evaluation by:   Reviewed with Physician:    Lilyan Gilford MS, LCAS, [facility], [facility] Therapeutic Triage Specialist 10/20/2019 1:18 AM

## 2019-10-20 NOTE — Discharge Instructions (Addendum)
Please seek medical attention and help for any thoughts about wanting to harm yourself, harm others, any concerning change in behavior, severe depression, inappropriate drug use or any other new or concerning symptoms. ° °

## 2019-10-20 NOTE — Progress Notes (Signed)
[place]  Candise Bowens MD  Progress Note Brief  10/20/19  Here in followup  He will go home in cab today as beds are full RTS --he is agreeable and has no active SI HI or plans.  He will go to programs on Monday after staying with sister   No other new medical problems psych issues  He wants to go to outpatient treatment for medications if needed  Brief  MS  Alert cooperative oriented times four No active SI HI or plans Contracts for safety  Not clouded or fluctuant  No active psychosis or mania No severe shakes and tremors -- Mood and affect improving  No severe anxiety  Vitals are stable    no other new signs   Patient to be discharged -- To his sister's house via cab voucher He is agreeable to this   TTS getting cab available for voucher

## 2019-10-20 NOTE — ED Notes (Signed)
E-signature not functional. Cab voucher given to patient. Patient respresenative calling the cab. Patient had a steady gait upon discharge.

## 2019-10-20 NOTE — BH Assessment (Signed)
TTS reassessment completed. Pt reports to be feeling better and requested to return to [facility]. Pt understands [facility] 30 day policy and agreed to follow up with additional SA resources provided (Al-Anon). Pt denied any current SI/HI/AH/VH and contracted his safety.  Per Dr.Rao pt is to be discharged with the recommendation to follow up the resources provided

## 2019-10-20 NOTE — BH Assessment (Signed)
TTS consulted with [facility] as originally planned for pt and was informed by Eber Jones of pt being unable to return for treatment until June 1st due to their 30 day return policy and to currently have no beds available.

## 2019-10-20 NOTE — Consult Note (Addendum)
[facility] Face-to-Face Psychiatry Consult   Reason for Consult: Psychiatric Evaluation Referring Physician: Dr. Norva Karvonen Patient Identification: Darren Rogers MRN:  [id number] Principal Diagnosis: <principal problem not specified> Diagnosis:  Active Problems:   Tobacco abuse   Personal history of tobacco use, presenting hazards to health   Alcohol abuse   Depression   Total Time spent with patient: 30 minutes  Subjective: "If I go out there, I am going to drink. I need to be here and go into recovery for my drinking." Darren Rogers is a 64 y.o. male patient presented to [facility] ED via law enforcement under involuntary commitment status (IVC). The patient is here because of thoughts of wanting to end his life. He voiced of a recent relapse with alcohol. The patient states he had been doing very well with his sobriety. He discussed being sober for seven years, and with a recent break-up, he relapsed and has been drinking for the last three months. The patient discussed since his relapse; he has been having suicidal thoughts, depression, feeling worthless, and hopelessness. The patient was seen face-to-face by this provider; chart reviewed and consulted with Dr. Colon Branch on 10/19/2019 due to the patient's care. It was discussed with the EDP that the patient does not meet the criteria to be admitted to the psychiatric inpatient unit. He would benefit from  Rehab to assist him in recovery from his alcohol used disorder diagnoses.  On evaluation, the patient is alert and oriented x 4, calm and cooperative, and mood-congruent with affect. The patient does not appear to be responding to internal or external stimuli. Neither is the patient presenting with any delusional thinking. The patient denies auditory or visual hallucinations. The patient denies any suicidal, homicidal, or self-harm ideations. The patient is not presenting with any psychotic or paranoid behaviors. During an encounter with the patient, he was able to  answer questions appropriately.   Plan: The patient is not a safety risk to self or others and does not require psychiatric inpatient admission for stabilization and treatment.  He could benefit from rehab to assist him with recovery from his alcohol use disorder diagnosis.  HPI: Per Dr. Erma Heritage: Darren Rogers is a 64 y.o. male with past medical history of coronary disease, chronic alcoholism, hyperlipidemia, A. fib, here with suicidal ideation. The patient arrives under IVC from his girlfriend. Reportedly, he has recently relapsed in alcohol, and has been drinking for the last 3 months. He endorses worsening depression, dysphoria, and thoughts of hopelessness. He has been drinking extremely heavily. He states that over the last several days, he has felt like he has not wanted to live. He reportedly told his significant other that he did not want to live. He stated that if he had a gun, he would shoot himself. She subsequently filled out paperwork, and patient was brought to the ED. On my assessment, the patient admits to drinking, but denies any other complaints. He does endorse depressed mood. He is somewhat evasive when asked about suicidal ideation. Denies any other complaints. Denies any abdominal pain, nausea, or vomiting. Denies any fevers or chills.  Past Psychiatric History:  Risk to Self: Suicidal Ideation: No Suicidal Intent: No Is patient at risk for suicide?: No Suicidal Plan?: No Access to Means: No What has been your use of drugs/alcohol within the last 12 months?: Alcohol & Cocaine How many times?: 0 Other Self Harm Risks: Alcohol & Cocaine Triggers for Past Attempts: None known Intentional Self Injurious Behavior: None Risk to Others: Homicidal Ideation: No  Thoughts of Harm to Others: No Current Homicidal Intent: No Current Homicidal Plan: No Access to Homicidal Means: No Identified Victim: Reports of none History of harm to others?: No Assessment of Violence: None  Noted Violent Behavior Description: Reports of none Does patient have access to weapons?: No Criminal Charges Pending?: No Does patient have a court date: No Prior Inpatient Therapy: Prior Inpatient Therapy: No Prior Outpatient Therapy: Prior Outpatient Therapy: No Does patient have an ACCT team?: No Does patient have Intensive In-House Services?  : No Does patient have [facility] services? : No Does patient have P4CC services?: No  Past Medical History:  Past Medical History:  Diagnosis Date  . Claudication (HCC)    a. 06/2015 ABI: R - 0.73, L - 0.73. 30-49% bilat SFA stenosis. 50-74% R Profunda stenosis; b. 01/2017 ABI: R 0.89, L 0.88, TBI R 0.65, L 1.0.  . Erectile dysfunction   . Essential hypertension   . History of echocardiogram    a. 03/29/2014: EF 55-60%, mild LVH, normal RVSP;  b. 05/2015 Echo: EF 60-65%, no rwma, triv AI, mild MR, mildly dil LA.  Marland Kitchen Hyperlipidemia   . Non-obstructive CAD    a. 01/27/2014 Cath: LM nl, LAD mild diff dz w/o obs, LCx no sig obs - scattered 20-30% mLCx, RCA no obs dz, EF 70%; b. 06/2015 MV; EF 60%, no nischemia.  Marland Kitchen PAF (paroxysmal atrial fibrillation) (HCC)    a. Pt says Dx >22yrs ago w/ ? RFCA @ [facility];  b. Recurrent 12/2013;  b. Rx Flecainide and Xarelto-->Intermittent compliance.  Marland Kitchen PVC's (premature ventricular contractions)    a. 05/2017 Zio: 4000 PVCs in 48 hrs (2%). Brief run of SVT.  . Tobacco abuse    a. ongoing - 1 ppd.    Past Surgical History:  Procedure Laterality Date  . CARDIAC CATHETERIZATION    . CARDIAC CATHETERIZATION     [facility]  . LEFT HEART CATH Right 01/27/2014   Procedure: LEFT HEART CATH;  Surgeon: Micheline Chapman, MD;  Location: [facility] CATH LAB;  Service: Cardiovascular;  Laterality: Right;   Family History:  Family History  Problem Relation Age of Onset  . Coronary artery disease Father 34  . Diabetes Mother   . Diabetes Sister   . Diabetes Brother   . Diabetes Maternal Grandmother   . Diabetes Sister   . Diabetes Sister     Family Psychiatric  History:  Social History:  Social History   Substance and Sexual Activity  Alcohol Use Yes     Social History   Substance and Sexual Activity  Drug Use No    Social History   Socioeconomic History  . Marital status: Single    Spouse name: Not on file  . Number of children: Not on file  . Years of education: Not on file  . Highest education level: Not on file  Occupational History  . Not on file  Tobacco Use  . Smoking status: Current Every Day Smoker    Packs/day: 0.50    Years: 30.00    Pack years: 15.00    Types: Cigarettes  . Smokeless tobacco: Never Used  Substance and Sexual Activity  . Alcohol use: Yes  . Drug use: No  . Sexual activity: Not on file  Other Topics Concern  . Not on file  Social History Narrative   The patient lives in [place] with his sister. He works in a substance abuse program. He has a history of alcoholism but has been  clean for 4 years. He is a long-time smoker, one pack per day. No illicit drugs. He is not married.   Social Determinants of Health   Financial Resource Strain:   . Difficulty of Paying Living Expenses:   Food Insecurity:   . Worried About Charity fundraiser in the Last Year:   . Arboriculturist in the Last Year:   Transportation Needs:   . Film/video editor (Medical):   Marland Kitchen Lack of Transportation (Non-Medical):   Physical Activity:   . Days of Exercise per Week:   . Minutes of Exercise per Session:   Stress:   . Feeling of Stress :   Social Connections:   . Frequency of Communication with Friends and Family:   . Frequency of Social Gatherings with Friends and Family:   . Attends Religious Services:   . Active Member of Clubs or Organizations:   . Attends Archivist Meetings:   Marland Kitchen Marital Status:    Additional Social History:    Allergies:  No Known Allergies  Labs:  Results for orders placed or performed during the hospital encounter of 10/19/19 (from  the past 48 hour(s))  Comprehensive metabolic panel     Status: Abnormal   Collection Time: 10/19/19 12:12 PM  Result Value Ref Range   Sodium 135 135 - 145 mmol/L   Potassium 4.5 3.5 - 5.1 mmol/L   Chloride 99 98 - 111 mmol/L   CO2 22 22 - 32 mmol/L   Glucose, Bld 83 70 - 99 mg/dL    Comment: Glucose reference range applies only to samples taken after fasting for at least 8 hours.   BUN 9 8 - 23 mg/dL   Creatinine, Ser 0.84 0.61 - 1.24 mg/dL   Calcium 8.7 (L) 8.9 - 10.3 mg/dL   Total Protein 7.8 6.5 - 8.1 g/dL   Albumin 4.3 3.5 - 5.0 g/dL   AST 509 (H) 15 - 41 U/L   ALT 295 (H) 0 - 44 U/L   Alkaline Phosphatase 127 (H) 38 - 126 U/L   Total Bilirubin 0.8 0.3 - 1.2 mg/dL   GFR calc non Af Amer >60 >60 mL/min   GFR calc Af Amer >60 >60 mL/min   Anion gap 14 5 - 15    Comment: Performed at [facility], [address] [place], [place] [address]  Ethanol     Status: Abnormal   Collection Time: 10/19/19 12:12 PM  Result Value Ref Range   Alcohol, Ethyl (B) 209 (H) <10 mg/dL    Comment: (NOTE) Lowest detectable limit for serum alcohol is 10 mg/dL. For medical purposes only. Performed at [facility], [address] [place], [place] [address]   cbc     Status: None   Collection Time: 10/19/19 12:12 PM  Result Value Ref Range   WBC 4.4 4.0 - 10.5 K/uL   RBC 4.72 4.22 - 5.81 MIL/uL   Hemoglobin 15.0 13.0 - 17.0 g/dL   HCT 42.8 39.0 - 52.0 %   MCV 90.7 80.0 - 100.0 fL   MCH 31.8 26.0 - 34.0 pg   MCHC 35.0 30.0 - 36.0 g/dL   RDW 14.7 11.5 - 15.5 %   Platelets 241 150 - 400 K/uL   nRBC 0.0 0.0 - 0.2 %    Comment: Performed at [facility], [address] [place], [place] [address]  Acetaminophen level     Status: Abnormal   Collection Time: 10/19/19  1:33  PM  Result Value Ref Range   Acetaminophen (Tylenol), Serum <10 (L) 10 - 30 ug/mL    Comment: (NOTE) Therapeutic concentrations vary significantly. A range of 10-30 ug/mL  may be an effective  concentration for many patients. However, some  are best treated at concentrations outside of this range. Acetaminophen concentrations >150 ug/mL at 4 hours after ingestion  and >50 ug/mL at 12 hours after ingestion are often associated with  toxic reactions. Performed at [facility], [address] [place], [place] [address]   Protime-INR     Status: Abnormal   Collection Time: 10/19/19  1:34 PM  Result Value Ref Range   Prothrombin Time 11.3 (L) 11.4 - 15.2 seconds   INR 0.9 0.8 - 1.2    Comment: (NOTE) INR goal varies based on device and disease states. Performed at [facility], [address] [place], [place] [address]   Ammonia     Status: None   Collection Time: 10/19/19  1:34 PM  Result Value Ref Range   Ammonia 34 9 - 35 umol/L    Comment: Performed at [facility], [address] [place], [place] [address]  Lipase, blood     Status: None   Collection Time: 10/19/19  1:34 PM  Result Value Ref Range   Lipase 42 11 - 51 U/L    Comment: Performed at [facility], [address] [place], [place] [address]  Troponin I (High Sensitivity)     Status: Abnormal   Collection Time: 10/19/19  3:12 PM  Result Value Ref Range   Troponin I (High Sensitivity) 20 (H) <18 ng/L    Comment: (NOTE) Elevated high sensitivity troponin I (hsTnI) values and significant  changes across serial measurements may suggest ACS but many other  chronic and acute conditions are known to elevate hsTnI results.  Refer to the "Links" section for chest pain algorithms and additional  guidance. Performed at [facility], [address] [place], [place] [address]   SARS Coronavirus 2 by RT PCR (hospital order, performed in [facility] hospital lab) Nasopharyngeal Nasopharyngeal Swab     Status: None   Collection Time: 10/19/19  6:47 PM   Specimen: Nasopharyngeal Swab  Result Value Ref Range   SARS Coronavirus 2 NEGATIVE NEGATIVE    Comment:  (NOTE) SARS-CoV-2 target nucleic acids are NOT DETECTED. The SARS-CoV-2 RNA is generally detectable in upper and lower respiratory specimens during the acute phase of infection. The lowest concentration of SARS-CoV-2 viral copies this assay can detect is 250 copies / mL. A negative result does not preclude SARS-CoV-2 infection and should not be used as the sole basis for treatment or other patient management decisions.  A negative result may occur with improper specimen collection / handling, submission of specimen other than nasopharyngeal swab, presence of viral mutation(s) within the areas targeted by this assay, and inadequate number of viral copies (<250 copies / mL). A negative result must be combined with clinical observations, patient history, and epidemiological information. Fact Sheet for Patients:   BoilerBrush.com.cy Fact Sheet for Healthcare Providers: https://pope.com/ This test is not yet approved or cleared  by the [place] FDA and has been authorized for detection and/or diagnosis of SARS-CoV-2 by FDA under an Emergency Use Authorization (EUA).  This EUA will remain in effect (meaning this test can be used) for the duration of the COVID-19 declaration under Section 564(b)(1) of the Act, 21 U.S.C. section 360bbb-3(b)(1), unless the authorization is terminated or revoked  sooner. Performed at [facility], [address] [place], [place] [address]   Troponin I (High Sensitivity)     Status: None   Collection Time: 10/19/19  6:47 PM  Result Value Ref Range   Troponin I (High Sensitivity) 16 <18 ng/L    Comment: (NOTE) Elevated high sensitivity troponin I (hsTnI) values and significant  changes across serial measurements may suggest ACS but many other  chronic and acute conditions are known to elevate hsTnI results.  Refer to the "Links" section for chest pain algorithms and additional  guidance. Performed  at [facility], [address] [place], [place] [address]     Current Facility-Administered Medications  Medication Dose Route Frequency Provider Last Rate Last Admin  . aspirin EC tablet 81 mg  81 mg Oral Daily Shaune Pollack, MD      . flecainide [address] [facility]) tablet 100 mg  100 mg Oral Q12H Shaune Pollack, MD      . LORazepam (ATIVAN) injection 0-4 mg  0-4 mg Intravenous Q6H Shaune Pollack, MD   1 mg at 10/19/19 2220   Or  . LORazepam (ATIVAN) tablet 0-4 mg  0-4 mg Oral Q6H Shaune Pollack, MD      . Melene Muller ON 10/22/2019] LORazepam (ATIVAN) injection 0-4 mg  0-4 mg Intravenous Q12H Shaune Pollack, MD       Or  . Melene Muller ON 10/22/2019] LORazepam (ATIVAN) tablet 0-4 mg  0-4 mg Oral Q12H Shaune Pollack, MD      . metoprolol tartrate (LOPRESSOR) tablet 50 mg  50 mg Oral BID Shaune Pollack, MD   50 mg at 10/19/19 2221  . ondansetron (ZOFRAN) injection 4 mg  4 mg Intravenous Once Miguel Aschoff., MD      . rivaroxaban Carlena Hurl) tablet 20 mg  20 mg Oral Q supper Miguel Aschoff., MD      . rosuvastatin (CRESTOR) tablet 20 mg  20 mg Oral Daily Miguel Aschoff., MD      . thiamine tablet 100 mg  100 mg Oral Daily Shaune Pollack, MD       Or  . thiamine (B-1) injection 100 mg  100 mg Intravenous Daily Shaune Pollack, MD   100 mg at 10/19/19 1652   Current Outpatient Medications  Medication Sig Dispense Refill  . aspirin EC 81 MG tablet Take 1 tablet (81 mg total) by mouth daily. 30 tablet 1  . flecainide (TAMBOCOR) 100 MG tablet Take 1 tablet (100 mg total) by mouth every 12 (twelve) hours. 60 tablet 1  . metoprolol tartrate (LOPRESSOR) 100 MG tablet Take 0.5 tablets (50 mg total) by mouth 2 (two) times daily. 30 tablet 1  . rivaroxaban (XARELTO) 20 MG TABS tablet Take 1 tablet (20 mg total) by mouth daily with supper. 90 tablet 3  . rosuvastatin (CRESTOR) 20 MG tablet Take 1 tablet (20 mg total) by mouth daily. 30 tablet 1  . sildenafil (VIAGRA) 25 MG tablet Take 1 tablet (25 mg  total) by mouth daily as needed for erectile dysfunction. 8 tablet 3    Musculoskeletal: Strength & Muscle Tone: within normal limits Gait & Station: normal Patient leans: N/A  Psychiatric Specialty Exam: Physical Exam  Nursing note and vitals reviewed. Constitutional: He is oriented to person, place, and time.  Musculoskeletal:        General: Normal range of motion.     Cervical back: Normal range of motion and neck supple.  Neurological: He is alert and oriented to person, place, and  time.  Psychiatric: His behavior is normal. Thought content normal.    Review of Systems  Psychiatric/Behavioral: Positive for sleep disturbance. The patient is nervous/anxious.   All other systems reviewed and are negative.   Blood pressure (!) 141/77, pulse 87, temperature 98.8 F (37.1 C), temperature source Oral, resp. rate (!) 21, height 5\' 6"  (1.676 m), weight 68 kg, SpO2 98 %.Body mass index is 24.2 kg/m.  General Appearance: Casual  Eye Contact:  Good  Speech:  Clear and Coherent  Volume:  Decreased  Mood:  Anxious, Depressed and Hopeless  Affect:  Congruent  Thought Process:  Coherent  Orientation:  Full (Time, Place, and Person)  Thought Content:  Logical  Suicidal Thoughts:  No  Homicidal Thoughts:  No  Memory:  Immediate;   Good Recent;   Good Remote;   Good  Judgement:  Intact  Insight:  Good  Psychomotor Activity:  Normal  Concentration:  Concentration: Good and Attention Span: Good  Recall:  Good  Fund of Knowledge:  Good  Language:  Good  Akathisia:  Negative  Handed:  Right  AIMS (if indicated):     Assets:  Communication Skills Desire for Improvement Financial Resources/Insurance Physical Health Resilience Social Support  ADL's:  Intact  Cognition:  WNL  Sleep:    Insomnia     Treatment Plan Summary: Medication management and Plan The patient will remain under observation overnight and reassess in the a.m. The patient discussed wanting to go to a rehab  facility to assist him with recovery from alcohol use disorder diagnosis.  Disposition: No evidence of imminent risk to self or others at present.   Patient does not meet criteria for psychiatric inpatient admission. Supportive therapy provided about ongoing stressors. Discussed crisis plan, support from social network, calling 911, coming to the Emergency Department, and calling Suicide Hotline.  Gillermo Murdoch, NP 10/20/2019 2:55 AM

## 2019-12-07 ENCOUNTER — Encounter: Payer: Self-pay | Admitting: Emergency Medicine

## 2019-12-07 ENCOUNTER — Other Ambulatory Visit: Payer: Self-pay

## 2019-12-07 ENCOUNTER — Emergency Department
Admission: EM | Admit: 2019-12-07 | Discharge: 2019-12-07 | Disposition: A | Discharge status: Home / Self Care | Payer: Self-pay | Attending: Emergency Medicine | Admitting: Emergency Medicine

## 2019-12-07 DIAGNOSIS — K047 Periapical abscess without sinus: Secondary | ICD-10-CM | POA: Insufficient documentation

## 2019-12-07 DIAGNOSIS — I2510 Atherosclerotic heart disease of native coronary artery without angina pectoris: Secondary | ICD-10-CM | POA: Insufficient documentation

## 2019-12-07 DIAGNOSIS — K029 Dental caries, unspecified: Secondary | ICD-10-CM | POA: Insufficient documentation

## 2019-12-07 DIAGNOSIS — F17210 Nicotine dependence, cigarettes, uncomplicated: Secondary | ICD-10-CM | POA: Insufficient documentation

## 2019-12-07 DIAGNOSIS — Z5321 Procedure and treatment not carried out due to patient leaving prior to being seen by health care provider: Secondary | ICD-10-CM | POA: Insufficient documentation

## 2019-12-07 DIAGNOSIS — Z7982 Long term (current) use of aspirin: Secondary | ICD-10-CM | POA: Insufficient documentation

## 2019-12-07 DIAGNOSIS — Z79899 Other long term (current) drug therapy: Secondary | ICD-10-CM | POA: Insufficient documentation

## 2019-12-07 DIAGNOSIS — K0889 Other specified disorders of teeth and supporting structures: Secondary | ICD-10-CM | POA: Insufficient documentation

## 2019-12-07 DIAGNOSIS — I119 Hypertensive heart disease without heart failure: Secondary | ICD-10-CM | POA: Insufficient documentation

## 2019-12-07 MED ORDER — LIDOCAINE VISCOUS HCL 2 % MT SOLN
15.0000 mL | Freq: Once | OROMUCOSAL | Status: AC
  Administered 2019-12-07: 15 mL via OROMUCOSAL
  Filled 2019-12-07: qty 15

## 2019-12-07 MED ORDER — AMOXICILLIN 500 MG PO CAPS
500.0000 mg | ORAL_CAPSULE | Freq: Three times a day (TID) | ORAL | 0 refills | Status: DC
Start: 2019-12-07 — End: 2020-08-05

## 2019-12-07 MED ORDER — AMOXICILLIN 500 MG PO CAPS
500.0000 mg | ORAL_CAPSULE | Freq: Once | ORAL | Status: AC
  Administered 2019-12-07: 500 mg via ORAL
  Filled 2019-12-07: qty 1

## 2019-12-07 MED ORDER — LIDOCAINE VISCOUS HCL 2 % MT SOLN
10.0000 mL | OROMUCOSAL | 0 refills | Status: DC | PRN
Start: 2019-12-07 — End: 2020-08-05

## 2019-12-07 NOTE — ED Provider Notes (Signed)
[facility] Emergency Department Provider Note  ____________________________________________  Time seen: Approximately 4:10 PM  I have reviewed the triage vital signs and the nursing notes.   HISTORY  Chief Complaint Dental Pain    HPI Darren Rogers is a 64 y.o. male that presents to the emergency department for evaluation of left upper dental pain for 2 days.  Patient states that he had similar pain 5 years ago and followed up with a dentist and was told that he needed several teeth pulled.  Pain had resolved at the time so he did not feel the need to have teeth pulled.  He does not currently have a dentist.  He came to the emergency department this morning but left due to the wait.  He was given viscous lidocaine in triage and states that this helped.  No fevers, swelling.    Past Medical History:  Diagnosis Date  . Claudication (HCC)    a. 06/2015 ABI: R - 0.73, L - 0.73. 30-49% bilat SFA stenosis. 50-74% R Profunda stenosis; b. 01/2017 ABI: R 0.89, L 0.88, TBI R 0.65, L 1.0.  . Erectile dysfunction   . Essential hypertension   . History of echocardiogram    a. 03/29/2014: EF 55-60%, mild LVH, normal RVSP;  b. 05/2015 Echo: EF 60-65%, no rwma, triv AI, mild MR, mildly dil LA.  Marland Kitchen Hyperlipidemia   . Non-obstructive CAD    a. 01/27/2014 Cath: LM nl, LAD mild diff dz w/o obs, LCx no sig obs - scattered 20-30% mLCx, RCA no obs dz, EF 70%; b. 06/2015 MV; EF 60%, no nischemia.  Marland Kitchen PAF (paroxysmal atrial fibrillation) (HCC)    a. Pt says Dx >40yrs ago w/ ? RFCA @ [facility];  b. Recurrent 12/2013;  b. Rx Flecainide and Xarelto-->Intermittent compliance.  Marland Kitchen PVC's (premature ventricular contractions)    a. 05/2017 Zio: 4000 PVCs in 48 hrs (2%). Brief run of SVT.  . Tobacco abuse    a. ongoing - 1 ppd.    Patient Active Problem List   Diagnosis Date Noted  . Alcohol abuse 10/20/2019  . Depression 10/20/2019  . A-fib (HCC) 01/15/2017  . Personal history of tobacco use,  presenting hazards to health 08/17/2016  . BPH associated with nocturia 07/30/2016  . IFG (impaired fasting glucose) 07/30/2016  . Essential hypertension   . Claudication (HCC)   . 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 CAD in native artery 04/11/2014  . Paroxysmal atrial fibrillation (HCC)   . Tobacco abuse   . Hyperlipidemia   . Atrial fibrillation with rapid ventricular response (HCC) 01/27/2014    Past Surgical History:  Procedure Laterality Date  . CARDIAC CATHETERIZATION    . CARDIAC CATHETERIZATION     [facility]  . LEFT HEART CATH Right 01/27/2014   Procedure: LEFT HEART CATH;  Surgeon: Micheline Chapman, MD;  Location: [facility] CATH LAB;  Service: Cardiovascular;  Laterality: Right;    Prior to Admission medications   Medication Sig Start Date End Date Taking? Authorizing Provider  amoxicillin (AMOXIL) 500 MG capsule Take 1 capsule (500 mg total) by mouth 3 (three) times daily. 12/07/19   Enid Derry, PA-C  aspirin EC 81 MG tablet Take 1 tablet (81 mg total) by mouth daily. Patient not taking: Reported on 10/20/2019 08/16/19   Emily Filbert, MD  flecainide (TAMBOCOR) 100 MG tablet Take 1 tablet (100 mg total) by mouth every 12 (twelve) hours. 08/16/19   Emily Filbert, MD  lidocaine (XYLOCAINE) 2 % solution  Use as directed 10 mLs in the mouth or throat as needed. 12/07/19   Enid Derry, PA-C  metoprolol tartrate (LOPRESSOR) 100 MG tablet Take 0.5 tablets (50 mg total) by mouth 2 (two) times daily. 08/16/19   Emily Filbert, MD  rivaroxaban (XARELTO) 20 MG TABS tablet Take 1 tablet (20 mg total) by mouth daily with supper. Patient not taking: Reported on 10/20/2019 08/16/19   Emily Filbert, MD  rosuvastatin (CRESTOR) 20 MG tablet Take 1 tablet (20 mg total) by mouth daily. Patient not taking: Reported on 10/20/2019 08/16/19   Emily Filbert, MD    Allergies Patient has no known allergies.  Family History  Problem Relation Age of Onset  . Coronary  artery disease Father 62  . Diabetes Mother   . Diabetes Sister   . Diabetes Brother   . Diabetes Maternal Grandmother   . Diabetes Sister   . Diabetes Sister     Social History Social History   Tobacco Use  . Smoking status: Current Every Day Smoker    Packs/day: 0.50    Years: 30.00    Pack years: 15.00    Types: Cigarettes  . Smokeless tobacco: Never Used  Substance Use Topics  . Alcohol use: Yes  . Drug use: No     Review of Systems  Constitutional: No fever/chills Cardiovascular: No chest pain. Respiratory: No cough. No SOB. Gastrointestinal: No abdominal pain.  No nausea, no vomiting.  Musculoskeletal: Negative for musculoskeletal pain. Skin: Negative for rash, abrasions, lacerations, ecchymosis. Neurological: Negative for headaches, numbness or tingling   ____________________________________________   PHYSICAL EXAM:  VITAL SIGNS: ED Triage Vitals  Enc Vitals Group     BP 12/07/19 1405 (!) 152/83     Pulse Rate 12/07/19 1405 60     Resp 12/07/19 1405 18     Temp 12/07/19 1405 98.4 F (36.9 C)     Temp Source 12/07/19 1405 Oral     SpO2 12/07/19 1405 100 %     Weight 12/07/19 1358 149 lb (67.6 kg)     Height 12/07/19 1358 5\' 6"  (1.676 m)     Head Circumference --      Peak Flow --      Pain Score 12/07/19 1358 9     Pain Loc --      Pain Edu? --      Excl. in GC? --      Constitutional: Alert and oriented. Well appearing and in no acute distress. Eyes: Conjunctivae are normal. PERRL. EOMI. Head: Atraumatic. ENT:      Ears:      Nose: No congestion/rhinnorhea.      Mouth/Throat: Mucous membranes are moist. Plaque present. Tenderness to palpation to left top molars. Full ROM of jaw.  Visible cavities and dentition.  No swelling. Neck: No stridor.  Cardiovascular: Normal rate, regular rhythm.  Good peripheral circulation. Respiratory: Normal respiratory effort without tachypnea or retractions. Lungs CTAB. Good air entry to the bases with no  decreased or absent breath sounds. Musculoskeletal: Full range of motion to all extremities. No gross deformities appreciated. Neurologic:  Normal speech and language. No gross focal neurologic deficits are appreciated.  Skin:  Skin is warm, dry and intact. No rash noted. Psychiatric: Mood and affect are normal. Speech and behavior are normal. Patient exhibits appropriate insight and judgement.   ____________________________________________   LABS (all labs ordered are listed, but only abnormal results are displayed)  Labs Reviewed - No data to display ____________________________________________  EKG   ____________________________________________  RADIOLOGY   No results found.  ____________________________________________    PROCEDURES  Procedure(s) performed:    Procedures    Medications  amoxicillin (AMOXIL) capsule 500 mg (500 mg Oral Given 12/07/19 1632)  lidocaine (XYLOCAINE) 2 % viscous mouth solution 15 mL (15 mLs Mouth/Throat Given 12/07/19 1632)     ____________________________________________   INITIAL IMPRESSION / ASSESSMENT AND PLAN / ED COURSE  Pertinent labs & imaging results that were available during my care of the patient were reviewed by me and considered in my medical decision making (see chart for details).  Review of the [place] CSRS was performed in accordance of the [facility] prior to dispensing any controlled drugs.   Patient's diagnosis is consistent with dental caries and dental infection. Vital signs and exam are reassuring. Patient was given amoxicillin for possible infection and viscous lidocaine for pain. Patient will be discharged home with prescriptions for amoxicillin and viscous lidocaine. Patient is to follow up with dentist as directed. Patient is given ED precautions to return to the ED for any worsening or new symptoms.   Darren Rogers was evaluated in Emergency Department on 12/07/2019 for the symptoms described in the history of present  illness. He was evaluated in the context of the global COVID-19 pandemic, which necessitated consideration that the patient might be at risk for infection with the SARS-CoV-2 virus that causes COVID-19. Institutional protocols and algorithms that pertain to the evaluation of patients at risk for COVID-19 are in a state of rapid change based on information released by regulatory bodies including the CDC and federal and state organizations. These policies and algorithms were followed during the patient's care in the ED.  ____________________________________________  FINAL CLINICAL IMPRESSION(S) / ED DIAGNOSES  Final diagnoses:  Dental infection      NEW MEDICATIONS STARTED DURING THIS VISIT:  ED Discharge Orders         Ordered    amoxicillin (AMOXIL) 500 MG capsule  3 times daily     Discontinue  Reprint     12/07/19 1626    lidocaine (XYLOCAINE) 2 % solution  As needed     Discontinue  Reprint     12/07/19 1626              This chart was dictated using voice recognition software/Dragon. Despite best efforts to proofread, errors can occur which can change the meaning. Any change was purely unintentional.    Enid Derry, PA-C 12/07/19 1754    Concha Se, MD 12/08/19 1226

## 2019-12-07 NOTE — ED Triage Notes (Signed)
Pt called from [facility], to treatment room, no response

## 2019-12-07 NOTE — ED Notes (Signed)
Pt updated in [facility], VS reassessed °

## 2019-12-07 NOTE — ED Notes (Signed)
Pt called from [facility] to treatment room, no response 

## 2019-12-07 NOTE — ED Triage Notes (Signed)
Pt c/o upper left sided dental pain. Pt denies fever and discharge.

## 2019-12-07 NOTE — Discharge Instructions (Signed)
OPTIONS FOR DENTAL FOLLOW UP CARE ° °[place] Department of Health and Human Services - Local Safety Net Dental Clinics °http://www.ncdhhs.gov/dph/oralhealth/services/safetynetclinics.htm °  °[facility] (336-562-3123) ° °[facility] ([phone]) ° °[facility] ([phone] ext [phone]) ° °[facility] ([phone]) ° °[facility] ([phone]) °This clinic caters to the indigent population and is on a lottery system. °Location: °[facility], [address], [place] °Clinic Hours: °Wednesdays from 6pm - 9pm, patients seen by a lottery system. °For dates, call or go to www.med.[facility].edu/[facility]/patients/Dental-[facility] °Services: °Cleanings, fillings and simple extractions. °Payment Options: °DENTAL WORK IS FREE OF CHARGE. Bring proof of income or support. °Best way to get seen: °Arrive at 5:15 pm - this is a lottery, NOT first come/first serve, so arriving earlier will not increase your chances of being seen. °  °  °[facility] °[phone] °Select option 1 for emergencies °  °Location: °[facility], [address], [place] °Clinic Hours: °No walk-ins accepted - call the day before to schedule an appointment. °Check in times are 9:30 am and 1:30 pm. °Services: °Simple extractions, temporary fillings, pulpectomy/pulp debridement, uncomplicated abscess drainage. °Payment Options: °PAYMENT IS DUE AT THE TIME OF SERVICE.  Fee is usually $100-200, additional surgical procedures (e.g. abscess drainage) may be extra. °Cash, checks, Visa/MasterCard accepted.  Can file Medicaid if patient is covered for dental - patient should call case worker to check. °No discount for [facility] patients. °Best way to get seen: °MUST call the day before and get onto the schedule. Can usually be seen the next 1-2 days. No walk-ins accepted. °  °  °[facility] °[phone] °   °Location: °[facility], [address], [place] °Clinic Hours: °M, W, Th, F 8am or 1:30pm, Tues 9a or 1:30 - first come/first served. °Services: °Simple extractions, temporary fillings, uncomplicated abscess drainage.  You do not need to be an [place] resident. °Payment Options: °PAYMENT IS DUE AT THE TIME OF SERVICE. °Dental insurance, otherwise sliding scale - bring proof of income or support. °Depending on income and treatment needed, cost is usually $50-200. °Best way to get seen: °Arrive early as it is first come/first served. °  °  °[facility] °[phone] °  °Location: °[address] °Clinic Hours: °Mon-Thu 8a-5p °Services: °Most basic dental services including extractions and fillings. °Payment Options: °PAYMENT IS DUE AT THE TIME OF SERVICE. °Sliding scale, up to 50% off - bring proof if income or support. °Medicaid with dental option accepted. °Best way to get seen: °Call to schedule an appointment, can usually be seen within 2 weeks OR they will try to see walk-ins - show up at 8a or 2p (you may have to wait). °  °  °[facility] °[phone] °[place] RESIDENTS ONLY °  °Location: °[facility], [address], [place], [place] [address] °Clinic Hours: By appointment only. °Monday - Thursday 8am-5pm, Friday 8am-12pm °Services: Cleanings, fillings, extractions. °Payment Options: °PAYMENT IS DUE AT THE TIME OF SERVICE. °Cash, Visa or MasterCard. Sliding scale - $30 minimum per service. °Best way to get seen: °Come in to office, complete packet and make an appointment - need proof of income °or support monies for each household member and proof of [place] residence. °Usually takes about a month to get in. °  °  °[facility] °[phone] °  °Location: °[address]   [place] °Clinic Hours: Walk-in Urgent Care Dental Services are offered Monday-Friday  mornings only. °The numbers of emergencies accepted daily is limited to the number of °providers available. °Maximum 15 - Mondays, Wednesdays & Thursdays °Maximum 10 - Tuesdays & Fridays °Services: °You do not need to be a [place] resident to be seen for a dental emergency. °Emergencies are defined as pain, swelling, abnormal bleeding, or dental trauma. Walkins will receive x-rays if needed. °NOTE: Dental cleaning is not an emergency. °Payment Options: °PAYMENT IS DUE AT THE TIME OF SERVICE. °Minimum co-pay is $40.00 for uninsured patients. °Minimum co-pay is $3.00 for Medicaid with dental coverage. °Dental Insurance is accepted and must be presented at time of visit. °Medicare does not cover dental. °Forms of payment: Cash, credit card, checks. °Best way to get seen: °If not previously registered with the clinic, walk-in dental registration begins at 7:15 am and is on a first come/first serve basis. °If previously registered with the clinic, call to make an appointment. °  °  °[facility] °[phone] °[place] RESIDENTS ONLY °  °Location: °[address], [place],  °Clinic Hours: °Mon-Thu 10a-2p °Services: Extractions only! °Payment Options: °FREE (donations accepted) - bring proof of income or support °Best way to get seen: °Call and schedule an appointment OR come at 8am on the 1st Monday of every month (except for holidays) when it is first come/first served. °  °  °[facility] °[phone] °  °Location: °[address], [place] °Clinic Hours: °Friday mornings °Services, Payment Options, Best way to get seen: °Call for info °

## 2019-12-07 NOTE — ED Notes (Signed)
Pt called from [facility] to treatment room x's 3, no response 

## 2019-12-07 NOTE — ED Notes (Signed)
Pt called from [facility] to treatment room no response 

## 2019-12-07 NOTE — ED Triage Notes (Signed)
Pt reports toothache to left upper jaw since yesterday. Pt reports was here but left due to wait.

## 2019-12-31 ENCOUNTER — Emergency Department
Admission: EM | Admit: 2019-12-31 | Discharge: 2019-12-31 | Disposition: A | Discharge status: Home / Self Care | Payer: Self-pay | Attending: Emergency Medicine | Admitting: Emergency Medicine

## 2019-12-31 ENCOUNTER — Other Ambulatory Visit: Payer: Self-pay

## 2019-12-31 ENCOUNTER — Encounter: Payer: Self-pay | Admitting: Emergency Medicine

## 2019-12-31 DIAGNOSIS — Z5321 Procedure and treatment not carried out due to patient leaving prior to being seen by health care provider: Secondary | ICD-10-CM | POA: Insufficient documentation

## 2019-12-31 DIAGNOSIS — K0889 Other specified disorders of teeth and supporting structures: Secondary | ICD-10-CM | POA: Insufficient documentation

## 2019-12-31 NOTE — ED Notes (Signed)
Pt outside smoking

## 2019-12-31 NOTE — ED Notes (Signed)
Patient observed walking out of ED lobby.

## 2019-12-31 NOTE — ED Notes (Signed)
Pt pacing in and out of room- pt informed to remain in patient care area

## 2019-12-31 NOTE — ED Notes (Signed)
Patient standing states he wants leave, explained that wants to make a phone call and decide if he is going to stay.  Explained to patient that he could not go outside the facility with the IV in place

## 2019-12-31 NOTE — ED Notes (Signed)
Pt no where to be found. EDP notified.

## 2019-12-31 NOTE — ED Notes (Signed)
Patient out of wheelchair stating he is going to leave.  Explained to patient that we would have to remove the iv that EMS had placed, patient sat back down.

## 2019-12-31 NOTE — ED Notes (Signed)
Patient observed walking out of ED waiting room.

## 2019-12-31 NOTE — ED Triage Notes (Signed)
Patient states that he is having left upper tooth pain that started yesterday.

## 2019-12-31 NOTE — ED Notes (Addendum)
Pt c/o left lower dental pain that recurred on Saturday. Pt states he tried gargling salt water, hydrogen peroxide, applying alcohol, and taking OTC medicine w/out relief. Pt states he has not been to dentist for the pain. Left jaw appears swollen. Pt reports regular alcohol intake.

## 2019-12-31 NOTE — ED Notes (Signed)
Patient requesting that iv be removed so he could go smoke.  "I don't know why you put in it any way."  Explained to patient that EMS had placed the IV not hospital staff.  18g angiocath removed from left fore arm and patient walked outside.  Patient never made statement to to RN of wanting to harm himself.

## 2019-12-31 NOTE — ED Notes (Signed)
Patient to ED waiting room via wheelchair by EMS. Per EMS patient with a toothache.  Patient made several statements to EMS personnel that he wanted to kill himself because his pain is so bad.  EMS interventions hr 92, bp 186/105, pulse oxi 100% on room air, saline loc to left forearm via 18 g angiocath

## 2020-01-01 ENCOUNTER — Encounter: Payer: Self-pay | Admitting: Emergency Medicine

## 2020-01-01 ENCOUNTER — Emergency Department
Admission: EM | Admit: 2020-01-01 | Discharge: 2020-01-01 | Disposition: A | Discharge status: Home / Self Care | Payer: Self-pay | Attending: Emergency Medicine | Admitting: Emergency Medicine

## 2020-01-01 ENCOUNTER — Other Ambulatory Visit: Payer: Self-pay

## 2020-01-01 ENCOUNTER — Emergency Department: Payer: Self-pay

## 2020-01-01 DIAGNOSIS — I10 Essential (primary) hypertension: Secondary | ICD-10-CM | POA: Insufficient documentation

## 2020-01-01 DIAGNOSIS — Z7982 Long term (current) use of aspirin: Secondary | ICD-10-CM | POA: Insufficient documentation

## 2020-01-01 DIAGNOSIS — R079 Chest pain, unspecified: Secondary | ICD-10-CM | POA: Insufficient documentation

## 2020-01-01 DIAGNOSIS — I480 Paroxysmal atrial fibrillation: Secondary | ICD-10-CM | POA: Insufficient documentation

## 2020-01-01 DIAGNOSIS — K0401 Reversible pulpitis: Secondary | ICD-10-CM | POA: Insufficient documentation

## 2020-01-01 DIAGNOSIS — F17210 Nicotine dependence, cigarettes, uncomplicated: Secondary | ICD-10-CM | POA: Insufficient documentation

## 2020-01-01 DIAGNOSIS — E785 Hyperlipidemia, unspecified: Secondary | ICD-10-CM | POA: Insufficient documentation

## 2020-01-01 DIAGNOSIS — Z79899 Other long term (current) drug therapy: Secondary | ICD-10-CM | POA: Insufficient documentation

## 2020-01-01 DIAGNOSIS — K029 Dental caries, unspecified: Secondary | ICD-10-CM | POA: Insufficient documentation

## 2020-01-01 LAB — CBC WITH DIFFERENTIAL/PLATELET
Abs Immature Granulocytes: 0.01 10*3/uL (ref 0.00–0.07)
Basophils Absolute: 0 10*3/uL (ref 0.0–0.1)
Basophils Relative: 1 %
Eosinophils Absolute: 0.1 10*3/uL (ref 0.0–0.5)
Eosinophils Relative: 1 %
HCT: 42.7 % (ref 39.0–52.0)
Hemoglobin: 14.8 g/dL (ref 13.0–17.0)
Immature Granulocytes: 0 %
Lymphocytes Relative: 25 %
Lymphs Abs: 1.8 10*3/uL (ref 0.7–4.0)
MCH: 32 pg (ref 26.0–34.0)
MCHC: 34.7 g/dL (ref 30.0–36.0)
MCV: 92.4 fL (ref 80.0–100.0)
Monocytes Absolute: 0.7 10*3/uL (ref 0.1–1.0)
Monocytes Relative: 10 %
Neutro Abs: 4.5 10*3/uL (ref 1.7–7.7)
Neutrophils Relative %: 63 %
Platelets: 235 10*3/uL (ref 150–400)
RBC: 4.62 MIL/uL (ref 4.22–5.81)
RDW: 15.3 % (ref 11.5–15.5)
WBC: 7.1 10*3/uL (ref 4.0–10.5)
nRBC: 0 % (ref 0.0–0.2)

## 2020-01-01 LAB — BASIC METABOLIC PANEL
Anion gap: 11 (ref 5–15)
BUN: 5 mg/dL — ABNORMAL LOW (ref 8–23)
CO2: 23 mmol/L (ref 22–32)
Calcium: 8.9 mg/dL (ref 8.9–10.3)
Chloride: 104 mmol/L (ref 98–111)
Creatinine, Ser: 0.87 mg/dL (ref 0.61–1.24)
GFR calc Af Amer: >60 mL/min (ref >60)
GFR calc non Af Amer: >60 mL/min (ref >60)
Glucose, Bld: 95 mg/dL (ref 70–99)
Potassium: 3.8 mmol/L (ref 3.5–5.1)
Sodium: 138 mmol/L (ref 135–145)

## 2020-01-01 LAB — TROPONIN I (HIGH SENSITIVITY)
Troponin I (High Sensitivity): 13 ng/L (ref <18)
Troponin I (High Sensitivity): 13 ng/L (ref <18)

## 2020-01-01 MED ORDER — MORPHINE SULFATE (PF) 4 MG/ML IV SOLN
4.0000 mg | Freq: Once | INTRAVENOUS | Status: AC
  Administered 2020-01-01: 4 mg via INTRAVENOUS
  Filled 2020-01-01: qty 1

## 2020-01-01 MED ORDER — AMOXICILLIN-POT CLAVULANATE 875-125 MG PO TABS: 1.0000 | ORAL_TABLET | Freq: Two times a day (BID) | ORAL | 0 refills | Status: AC

## 2020-01-01 NOTE — ED Provider Notes (Signed)
[facility] Emergency Department Provider Note   ____________________________________________   First MD Initiated Contact with Patient 01/01/20 0932     (approximate)  I have reviewed the triage vital signs and the nursing notes.   HISTORY  Chief Complaint Chest Pain    HPI Darren Rogers is a 64 y.o. male with possible history of hypertension, hyperlipidemia, and paroxysmal atrial fibrillation who presents to the ED complaining of chest pain and jaw pain.  Patient reports that he developed pain in his chest last night which he describes as "an elephant sitting on my chest".  He states it has been waxing and waning since then but is not exacerbated or alleviated by anything in particular.  He states he will feel short of breath when the pain is severe, but he does not feel short of breath currently.  He has also been dealing with pain in his left lower jaw, which he attributes to his left lower molar.  He was given aspirin and nitroglycerin by EMS prior to arrival and code STEMI called by EMS.  He now states that his chest pain is improved, but he continues to have severe pain in his left jaw.  He states that the jaw pain has been going on for multiple weeks, he had previously taken a course of antibiotics but has been unable to schedule follow-up with dentistry.        Past Medical History:  Diagnosis Date  . Claudication (HCC)    a. 06/2015 ABI: R - 0.73, L - 0.73. 30-49% bilat SFA stenosis. 50-74% R Profunda stenosis; b. 01/2017 ABI: R 0.89, L 0.88, TBI R 0.65, L 1.0.  . Erectile dysfunction   . Essential hypertension   . History of echocardiogram    a. 03/29/2014: EF 55-60%, mild LVH, normal RVSP;  b. 05/2015 Echo: EF 60-65%, no rwma, triv AI, mild MR, mildly dil LA.  Marland Kitchen Hyperlipidemia   . Non-obstructive CAD    a. 01/27/2014 Cath: LM nl, LAD mild diff dz w/o obs, LCx no sig obs - scattered 20-30% mLCx, RCA no obs dz, EF 70%; b. 06/2015 MV; EF 60%, no  nischemia.  Marland Kitchen PAF (paroxysmal atrial fibrillation) (HCC)    a. Pt says Dx >82yrs ago w/ ? RFCA @ [facility];  b. Recurrent 12/2013;  b. Rx Flecainide and Xarelto-->Intermittent compliance.  Marland Kitchen PVC's (premature ventricular contractions)    a. 05/2017 Zio: 4000 PVCs in 48 hrs (2%). Brief run of SVT.  . Tobacco abuse    a. ongoing - 1 ppd.    Patient Active Problem List   Diagnosis Date Noted  . Alcohol abuse 10/20/2019  . Depression 10/20/2019  . A-fib (HCC) 01/15/2017  . Personal history of tobacco use, presenting hazards to health 08/17/2016  . BPH associated with nocturia 07/30/2016  . IFG (impaired fasting glucose) 07/30/2016  . Essential hypertension   . Claudication (HCC)   . 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 CAD in native artery 04/11/2014  . Paroxysmal atrial fibrillation (HCC)   . Tobacco abuse   . Hyperlipidemia   . Atrial fibrillation with rapid ventricular response (HCC) 01/27/2014    Past Surgical History:  Procedure Laterality Date  . CARDIAC CATHETERIZATION    . [facility]     [facility]  . LEFT HEART CATH Right 01/27/2014   Procedure: LEFT HEART CATH;  Surgeon: Micheline Chapman, MD;  Location: [facility] CATH LAB;  Service: Cardiovascular;  Laterality: Right;    Prior to Admission medications  Medication Sig Start Date End Date Taking? Authorizing Provider  amoxicillin (AMOXIL) 500 MG capsule Take 1 capsule (500 mg total) by mouth 3 (three) times daily. 12/07/19   Enid Derry, PA-C  amoxicillin-clavulanate (AUGMENTIN) 875-125 MG tablet Take 1 tablet by mouth 2 (two) times daily for 7 days. 01/01/20 01/08/20  Chesley Noon, MD  aspirin EC 81 MG tablet Take 1 tablet (81 mg total) by mouth daily. Patient not taking: Reported on 10/20/2019 08/16/19   Emily Filbert, MD  flecainide (TAMBOCOR) 100 MG tablet Take 1 tablet (100 mg total) by mouth every 12 (twelve) hours. Patient not taking: Reported on 01/01/2020 08/16/19   Emily Filbert, MD  lidocaine  (XYLOCAINE) 2 % solution Use as directed 10 mLs in the mouth or throat as needed. 12/07/19   Enid Derry, PA-C  metoprolol tartrate (LOPRESSOR) 100 MG tablet Take 0.5 tablets (50 mg total) by mouth 2 (two) times daily. Patient not taking: Reported on 01/01/2020 08/16/19   Emily Filbert, MD  rivaroxaban (XARELTO) 20 MG TABS tablet Take 1 tablet (20 mg total) by mouth daily with supper. Patient not taking: Reported on 10/20/2019 08/16/19   Emily Filbert, MD  rosuvastatin (CRESTOR) 20 MG tablet Take 1 tablet (20 mg total) by mouth daily. Patient not taking: Reported on 10/20/2019 08/16/19   Emily Filbert, MD    Allergies Patient has no known allergies.  Family History  Problem Relation Age of Onset  . Coronary artery disease Father 68  . Diabetes Mother   . Diabetes Sister   . Diabetes Brother   . Diabetes Maternal Grandmother   . Diabetes Sister   . Diabetes Sister     Social History Social History   Tobacco Use  . Smoking status: Current Every Day Smoker    Packs/day: 0.50    Years: 30.00    Pack years: 15.00    Types: Cigarettes  . Smokeless tobacco: Never Used  Substance Use Topics  . Alcohol use: Yes  . Drug use: Not Currently    Types: Cocaine    Review of Systems  Constitutional: No fever/chills Eyes: No visual changes. ENT: No sore throat.  Positive for dental pain. Cardiovascular: Positive for chest pain. Respiratory: Denies shortness of breath. Gastrointestinal: No abdominal pain.  No nausea, no vomiting.  No diarrhea.  No constipation. Genitourinary: Negative for dysuria. Musculoskeletal: Negative for back pain. Skin: Negative for rash. Neurological: Negative for headaches, focal weakness or numbness.  ____________________________________________   PHYSICAL EXAM:  VITAL SIGNS: ED Triage Vitals  Enc Vitals Group     BP --      Pulse Rate 01/01/20 0928 96     Resp 01/01/20 0928 16     Temp --      Temp Source 01/01/20 0928 Oral      SpO2 --      Weight 01/01/20 0929 149 lb 14.6 oz (68 kg)     Height 01/01/20 0929 5\' 6"  (1.676 m)     Head Circumference --      Peak Flow --      Pain Score 01/01/20 0928 10     Pain Loc --      Pain Edu? --      Excl. in GC? --     Constitutional: Alert and oriented. Eyes: Conjunctivae are normal. Head: Atraumatic. Nose: No congestion/rhinnorhea. Mouth/Throat: Mucous membranes are moist.  Poor dentition throughout, tenderness to palpation over left lower molar with mild erythema but no focal fluctuance.  Neck: Normal ROM Cardiovascular: Normal rate, regular rhythm. Grossly normal heart sounds. Respiratory: Normal respiratory effort.  No retractions. Lungs CTAB.  No chest wall tenderness to palpation. Gastrointestinal: Soft and nontender. No distention. Genitourinary: deferred Musculoskeletal: No lower extremity tenderness nor edema. Neurologic:  Normal speech and language. No gross focal neurologic deficits are appreciated. Skin:  Skin is warm, dry and intact. No rash noted. Psychiatric: Mood and affect are normal. Speech and behavior are normal.  ____________________________________________   LABS (all labs ordered are listed, but only abnormal results are displayed)  Labs Reviewed  BASIC METABOLIC PANEL - Abnormal; Notable for the following components:      Result Value   BUN 5 (*)    All other components within normal limits  CBC WITH DIFFERENTIAL/PLATELET  TROPONIN I (HIGH SENSITIVITY)  TROPONIN I (HIGH SENSITIVITY)   ____________________________________________  EKG  ED ECG REPORT I, Chesley Noon, the attending physician, personally viewed and interpreted this ECG.   Date: 01/01/2020  EKG Time: 9:29  Rate: 85  Rhythm: normal sinus rhythm  Axis: Normal  Intervals:none  ST&T Change: Anterior ST elevation, diffuse T wave inversions   PROCEDURES  Procedure(s) performed (including Critical  Care):  Procedures   ____________________________________________   INITIAL IMPRESSION / ASSESSMENT AND PLAN / ED COURSE      64 year old male with past medical history of hypertension, hyperlipidemia, and atrial fibrillation who presents to the ED complaining of chest pain since last night as well as 2 weeks of left jaw and dental pain.  There was initial concern for acute MI based on his prehospital ECG and code STEMI was called.  Patient received aspirin and nitroglycerin prior to arrival, now states his chest pain is improved.  Prehospital EKG was reviewed by Dr. Kirke Corin of cardiology, who is aware of the patient from previous visits and states that EKG today is unchanged.  Code STEMI canceled per cardiology, EKG here in the ED does show anterior ST elevation as well as diffuse T wave inversions, however this is likely due to LVH.  Patient's primary complaint at this time is dental pain and he has had previous visits for similar complaints, we will screen 2 sets of troponin as well as chest x-ray, but if this is unremarkable it seems he would be appropriate for outpatient follow-up with cardiology.  He will require another course of antibiotics for apparent dental infection, but there is no focal fluctuance to suggest abscess.  Repeat troponin is within normal limits, patient reports pain is improved following dose of IV morphine.  He is appropriate for discharge home with cardiology and dentistry follow-up.  We will start him on Augmentin for apparent dental infection and he was counseled to return to the ED for new worsening symptoms, patient agrees with plan.      ____________________________________________   FINAL CLINICAL IMPRESSION(S) / ED DIAGNOSES  Final diagnoses:  Pain due to dental caries  Acute pulpitis  Chest pain, unspecified type     ED Discharge Orders         Ordered    amoxicillin-clavulanate (AUGMENTIN) 875-125 MG tablet  2 times daily     Discontinue  Reprint      01/01/20 1327           Note:  This document was prepared using Dragon voice recognition software and may include unintentional dictation errors.   Chesley Noon, MD 01/01/20 1328

## 2020-01-01 NOTE — ED Notes (Addendum)
Pt given bus route information for d/c. Also given phone to call cousin for a ride home. Ambulatory, steady gait. Denies questions/concerns regarding d/c

## 2020-01-01 NOTE — Progress Notes (Signed)
   01/01/20 0933  Clinical Encounter Type  Visited With Patient;Health care provider  Visit Type Initial  Referral From Other (Comment)  Consult/Referral To Chaplain  Advance Directives (For Healthcare)  Does Patient Have a Medical Advance Directive? No  Would patient like information on creating a medical advance directive? No - Patient declined  Mental Health Advance Directives  Does Patient Have a Mental Health Advance Directive? No  Would patient like information on creating a mental health advance directive? No - Patient declined  Chaplain responded to Code [employer]. After being in the room for 7-8 mins Code [employer] was canceled and chaplain left.

## 2020-01-01 NOTE — ED Triage Notes (Addendum)
Patient to ER for c/o chest pain. Was seen in ER last night for same. Patient identified as STEMI by EMS. Patient also c/o pain to neck. Nitro x2 and 324 ASA given by EMS.

## 2020-01-01 NOTE — ED Notes (Signed)
STEMI call cancelled.

## 2020-01-01 NOTE — ED Notes (Signed)
Patient notified girlfriend of being in ER.

## 2020-01-12 ENCOUNTER — Emergency Department
Admission: EM | Admit: 2020-01-12 | Discharge: 2020-01-12 | Disposition: A | Discharge status: Home / Self Care | Payer: Self-pay | Attending: Student in an Organized Health Care Education/Training Program | Admitting: Student in an Organized Health Care Education/Training Program

## 2020-01-12 ENCOUNTER — Other Ambulatory Visit: Payer: Self-pay

## 2020-01-12 ENCOUNTER — Encounter: Payer: Self-pay | Admitting: Emergency Medicine

## 2020-01-12 DIAGNOSIS — Y92410 Unspecified street and highway as the place of occurrence of the external cause: Secondary | ICD-10-CM | POA: Insufficient documentation

## 2020-01-12 DIAGNOSIS — Z041 Encounter for examination and observation following transport accident: Secondary | ICD-10-CM | POA: Diagnosis not present

## 2020-01-12 DIAGNOSIS — F17210 Nicotine dependence, cigarettes, uncomplicated: Secondary | ICD-10-CM | POA: Diagnosis not present

## 2020-01-12 DIAGNOSIS — Z79899 Other long term (current) drug therapy: Secondary | ICD-10-CM | POA: Insufficient documentation

## 2020-01-12 DIAGNOSIS — I10 Essential (primary) hypertension: Secondary | ICD-10-CM | POA: Diagnosis not present

## 2020-01-12 DIAGNOSIS — I2510 Atherosclerotic heart disease of native coronary artery without angina pectoris: Secondary | ICD-10-CM | POA: Insufficient documentation

## 2020-01-12 DIAGNOSIS — Y999 Unspecified external cause status: Secondary | ICD-10-CM | POA: Diagnosis not present

## 2020-01-12 DIAGNOSIS — Y9389 Activity, other specified: Secondary | ICD-10-CM | POA: Diagnosis not present

## 2020-01-12 DIAGNOSIS — Z7982 Long term (current) use of aspirin: Secondary | ICD-10-CM | POA: Diagnosis not present

## 2020-01-12 DIAGNOSIS — Z0283 Encounter for blood-alcohol and blood-drug test: Secondary | ICD-10-CM | POA: Insufficient documentation
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
## 2020-01-28 ENCOUNTER — Encounter: Payer: Self-pay | Admitting: Emergency Medicine

## 2020-01-28 ENCOUNTER — Other Ambulatory Visit: Payer: Self-pay

## 2020-01-28 ENCOUNTER — Emergency Department
Admission: EM | Admit: 2020-01-28 | Discharge: 2020-01-29 | Disposition: A | Discharge status: Home / Self Care | Attending: Emergency Medicine | Admitting: Emergency Medicine

## 2020-01-28 DIAGNOSIS — F17210 Nicotine dependence, cigarettes, uncomplicated: Secondary | ICD-10-CM | POA: Insufficient documentation

## 2020-01-28 DIAGNOSIS — Z20822 Contact with and (suspected) exposure to covid-19: Secondary | ICD-10-CM | POA: Insufficient documentation

## 2020-01-28 DIAGNOSIS — F1010 Alcohol abuse, uncomplicated: Secondary | ICD-10-CM

## 2020-01-28 DIAGNOSIS — I2510 Atherosclerotic heart disease of native coronary artery without angina pectoris: Secondary | ICD-10-CM | POA: Insufficient documentation

## 2020-01-28 DIAGNOSIS — Z7982 Long term (current) use of aspirin: Secondary | ICD-10-CM | POA: Insufficient documentation

## 2020-01-28 DIAGNOSIS — Z79899 Other long term (current) drug therapy: Secondary | ICD-10-CM | POA: Insufficient documentation

## 2020-01-28 DIAGNOSIS — I10 Essential (primary) hypertension: Secondary | ICD-10-CM | POA: Insufficient documentation

## 2020-01-28 LAB — CBC WITH DIFFERENTIAL/PLATELET
Abs Immature Granulocytes: 0.01 10*3/uL (ref 0.00–0.07)
Basophils Absolute: 0 10*3/uL (ref 0.0–0.1)
Basophils Relative: 1 %
Eosinophils Absolute: 0.1 10*3/uL (ref 0.0–0.5)
Eosinophils Relative: 2 %
HCT: 38.9 % — ABNORMAL LOW (ref 39.0–52.0)
Hemoglobin: 13.6 g/dL (ref 13.0–17.0)
Immature Granulocytes: 0 %
Lymphocytes Relative: 46 %
Lymphs Abs: 2.5 10*3/uL (ref 0.7–4.0)
MCH: 32.6 pg (ref 26.0–34.0)
MCHC: 35 g/dL (ref 30.0–36.0)
MCV: 93.3 fL (ref 80.0–100.0)
Monocytes Absolute: 0.4 10*3/uL (ref 0.1–1.0)
Monocytes Relative: 8 %
Neutro Abs: 2.3 10*3/uL (ref 1.7–7.7)
Neutrophils Relative %: 43 %
Platelets: 185 10*3/uL (ref 150–400)
RBC: 4.17 MIL/uL — ABNORMAL LOW (ref 4.22–5.81)
RDW: 15.1 % (ref 11.5–15.5)
WBC: 5.4 10*3/uL (ref 4.0–10.5)
nRBC: 0 % (ref 0.0–0.2)

## 2020-01-28 LAB — COMPREHENSIVE METABOLIC PANEL
ALT: 42 U/L (ref 0–44)
AST: 50 U/L — ABNORMAL HIGH (ref 15–41)
Albumin: 4.1 g/dL (ref 3.5–5.0)
Alkaline Phosphatase: 75 U/L (ref 38–126)
Anion gap: 15 (ref 5–15)
BUN: 9 mg/dL (ref 8–23)
CO2: 21 mmol/L — ABNORMAL LOW (ref 22–32)
Calcium: 9.1 mg/dL (ref 8.9–10.3)
Chloride: 99 mmol/L (ref 98–111)
Creatinine, Ser: 0.97 mg/dL (ref 0.61–1.24)
GFR calc Af Amer: >60 mL/min (ref >60)
GFR calc non Af Amer: >60 mL/min (ref >60)
Glucose, Bld: 118 mg/dL — ABNORMAL HIGH (ref 70–99)
Potassium: 3.9 mmol/L (ref 3.5–5.1)
Sodium: 135 mmol/L (ref 135–145)
Total Bilirubin: 0.6 mg/dL (ref 0.3–1.2)
Total Protein: 7.1 g/dL (ref 6.5–8.1)

## 2020-01-28 LAB — URINE DRUG SCREEN, QUALITATIVE (ARMC ONLY)
Amphetamines, Ur Screen: NOT DETECTED
Barbiturates, Ur Screen: NOT DETECTED
Benzodiazepine, Ur Scrn: NOT DETECTED
Cannabinoid 50 Ng, Ur ~~LOC~~: NOT DETECTED
Cocaine Metabolite,Ur ~~LOC~~: NOT DETECTED
MDMA (Ecstasy)Ur Screen: NOT DETECTED
Methadone Scn, Ur: NOT DETECTED
Opiate, Ur Screen: NOT DETECTED
Phencyclidine (PCP) Ur S: NOT DETECTED
Tricyclic, Ur Screen: NOT DETECTED

## 2020-01-28 LAB — ETHANOL: Alcohol, Ethyl (B): 198 mg/dL — ABNORMAL HIGH (ref <10)

## 2020-01-28 MED ORDER — LORAZEPAM 2 MG/ML IJ SOLN: 0.0000 mg | Freq: Four times a day (QID) | INTRAMUSCULAR | Status: DC

## 2020-01-28 MED ORDER — LORAZEPAM 2 MG PO TABS
0.0000 mg | ORAL_TABLET | Freq: Four times a day (QID) | ORAL | Status: DC
  Administered 2020-01-28 – 2020-01-29 (×2): 2 mg via ORAL
  Administered 2020-01-29: 1 mg via ORAL
  Filled 2020-01-28 (×3): qty 1

## 2020-01-28 MED ORDER — LORAZEPAM 2 MG PO TABS: 0.0000 mg | ORAL_TABLET | Freq: Two times a day (BID) | ORAL | Status: DC

## 2020-01-28 MED ORDER — LORAZEPAM 2 MG/ML IJ SOLN: 0.0000 mg | Freq: Two times a day (BID) | INTRAMUSCULAR | Status: DC

## 2020-01-28 MED ORDER — THIAMINE HCL 100 MG PO TABS
100.0000 mg | ORAL_TABLET | Freq: Every day | ORAL | Status: DC
  Administered 2020-01-29: 100 mg via ORAL
  Filled 2020-01-28: qty 1

## 2020-01-28 MED ORDER — THIAMINE HCL 100 MG/ML IJ SOLN: 100.0000 mg | Freq: Every day | INTRAMUSCULAR | Status: DC

## 2020-01-28 NOTE — ED Notes (Signed)
Pt reports he has been drinking a lot for the last month. States he needs to go to detox. Pt reports he spoke with [facility] who told him they had a bed available but he needed to come here to get cleared. Pt calm and cooperative at this time.

## 2020-01-28 NOTE — ED Notes (Signed)
Calvin, [facility] at bedside speaking with pt.

## 2020-01-28 NOTE — ED Notes (Signed)
Pt. Was given a sandwich tray and a drink. 

## 2020-01-28 NOTE — ED Provider Notes (Signed)
[facility] Emergency Department Provider Note   ____________________________________________   First MD Initiated Contact with Patient 01/28/20 2245     (approximate)  I have reviewed the triage vital signs and the nursing notes.   HISTORY  Chief Complaint Alcohol Problem    HPI Bert Ptacek is a 64 y.o. male with past medical history of hypertension, CAD, paroxysmal A. fib, and alcohol abuse presents to the ED requesting detox.  Patient reports that he has previously been sober for multiple years but started drinking a lot over the past month.  He states he drinks about five 40 ounce beers per day and has also been smoking crack cocaine.  His last drink was around 230 this afternoon and he currently denies any complaints.  He specifically denies any suicidal or homicidal ideation.  He states he spoke with [facility] prior to arrival and was told there may be a bed available for him.        Past Medical History:  Diagnosis Date  . Claudication (HCC)    a. 06/2015 ABI: R - 0.73, L - 0.73. 30-49% bilat SFA stenosis. 50-74% R Profunda stenosis; b. 01/2017 ABI: R 0.89, L 0.88, TBI R 0.65, L 1.0.  . Erectile dysfunction   . Essential hypertension   . History of echocardiogram    a. 03/29/2014: EF 55-60%, mild LVH, normal RVSP;  b. 05/2015 Echo: EF 60-65%, no rwma, triv AI, mild MR, mildly dil LA.  Marland Kitchen Hyperlipidemia   . Non-obstructive CAD    a. 01/27/2014 Cath: LM nl, LAD mild diff dz w/o obs, LCx no sig obs - scattered 20-30% mLCx, RCA no obs dz, EF 70%; b. 06/2015 MV; EF 60%, no nischemia.  Marland Kitchen PAF (paroxysmal atrial fibrillation) (HCC)    a. Pt says Dx >48yrs ago w/ ? RFCA @ [facility];  b. Recurrent 12/2013;  b. Rx Flecainide and Xarelto-->Intermittent compliance.  Marland Kitchen PVC's (premature ventricular contractions)    a. 05/2017 Zio: 4000 PVCs in 48 hrs (2%). Brief run of SVT.  . Tobacco abuse    a. ongoing - 1 ppd.    Patient Active Problem List   Diagnosis Date Noted  .  Alcohol abuse 10/20/2019  . Depression 10/20/2019  . A-fib (HCC) 01/15/2017  . Personal history of tobacco use, presenting hazards to health 08/17/2016  . BPH associated with nocturia 07/30/2016  . IFG (impaired fasting glucose) 07/30/2016  . Essential hypertension   . Claudication (HCC)   . 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 CAD in native artery 04/11/2014  . Paroxysmal atrial fibrillation (HCC)   . Tobacco abuse   . Hyperlipidemia   . Atrial fibrillation with rapid ventricular response (HCC) 01/27/2014    Past Surgical History:  Procedure Laterality Date  . CARDIAC CATHETERIZATION    . [facility]     [facility]  . LEFT HEART CATH Right 01/27/2014   Procedure: LEFT HEART CATH;  Surgeon: Micheline Chapman, MD;  Location: [facility] CATH LAB;  Service: Cardiovascular;  Laterality: Right;    Prior to Admission medications   Medication Sig Start Date End Date Taking? Authorizing Provider  amoxicillin (AMOXIL) 500 MG capsule Take 1 capsule (500 mg total) by mouth 3 (three) times daily. 12/07/19   Enid Derry, PA-C  aspirin EC 81 MG tablet Take 1 tablet (81 mg total) by mouth daily. Patient not taking: Reported on 10/20/2019 08/16/19   Emily Filbert, MD  flecainide (TAMBOCOR) 100 MG tablet Take 1 tablet (100 mg total)  by mouth every 12 (twelve) hours. Patient not taking: Reported on 01/01/2020 08/16/19   Emily Filbert, MD  lidocaine (XYLOCAINE) 2 % solution Use as directed 10 mLs in the mouth or throat as needed. 12/07/19   Enid Derry, PA-C  metoprolol tartrate (LOPRESSOR) 100 MG tablet Take 0.5 tablets (50 mg total) by mouth 2 (two) times daily. Patient not taking: Reported on 01/01/2020 08/16/19   Emily Filbert, MD  rivaroxaban (XARELTO) 20 MG TABS tablet Take 1 tablet (20 mg total) by mouth daily with supper. Patient not taking: Reported on 10/20/2019 08/16/19   Emily Filbert, MD  rosuvastatin (CRESTOR) 20 MG tablet Take 1 tablet (20 mg total) by  mouth daily. Patient not taking: Reported on 10/20/2019 08/16/19   Emily Filbert, MD    Allergies Patient has no known allergies.  Family History  Problem Relation Age of Onset  . Coronary artery disease Father 64  . Diabetes Mother   . Diabetes Sister   . Diabetes Brother   . Diabetes Maternal Grandmother   . Diabetes Sister   . Diabetes Sister     Social History Social History   Tobacco Use  . Smoking status: Current Every Day Smoker    Packs/day: 0.50    Years: 30.00    Pack years: 15.00    Types: Cigarettes  . Smokeless tobacco: Never Used  Substance Use Topics  . Alcohol use: Yes  . Drug use: Yes    Types: Cocaine    Review of Systems  Constitutional: No fever/chills Eyes: No visual changes. ENT: No sore throat. Cardiovascular: Denies chest pain. Respiratory: Denies shortness of breath. Gastrointestinal: No abdominal pain.  No nausea, no vomiting.  No diarrhea.  No constipation. Genitourinary: Negative for dysuria. Musculoskeletal: Negative for back pain. Skin: Negative for rash. Neurological: Negative for headaches, focal weakness or numbness.  Positive for alcohol abuse.  ____________________________________________   PHYSICAL EXAM:  VITAL SIGNS: ED Triage Vitals  Enc Vitals Group     BP 01/28/20 1922 118/71     Pulse Rate 01/28/20 1922 97     Resp 01/28/20 1922 18     Temp 01/28/20 1922 97.9 F (36.6 C)     Temp Source 01/28/20 1922 Oral     SpO2 01/28/20 1922 98 %     Weight 01/28/20 1923 155 lb (70.3 kg)     Height 01/28/20 1923 5\' 6"  (1.676 m)     Head Circumference --      Peak Flow --      Pain Score 01/28/20 1923 2     Pain Loc --      Pain Edu? --      Excl. in GC? --     Constitutional: Alert and oriented. Eyes: Conjunctivae are normal. Head: Atraumatic. Nose: No congestion/rhinnorhea. Mouth/Throat: Mucous membranes are moist. Neck: Normal ROM Cardiovascular: Normal rate, regular rhythm. Grossly normal heart  sounds. Respiratory: Normal respiratory effort.  No retractions. Lungs CTAB. Gastrointestinal: Soft and nontender. No distention. Genitourinary: deferred Musculoskeletal: No lower extremity tenderness nor edema. Neurologic:  Normal speech and language. No gross focal neurologic deficits are appreciated. Skin:  Skin is warm, dry and intact. No rash noted. Psychiatric: Mood and affect are normal. Speech and behavior are normal.  ____________________________________________   LABS (all labs ordered are listed, but only abnormal results are displayed)  Labs Reviewed  CBC WITH DIFFERENTIAL/PLATELET - Abnormal; Notable for the following components:      Result Value   RBC 4.17 (*)  HCT 38.9 (*)    All other components within normal limits  COMPREHENSIVE METABOLIC PANEL - Abnormal; Notable for the following components:   CO2 21 (*)    Glucose, Bld 118 (*)    AST 50 (*)    All other components within normal limits  ETHANOL - Abnormal; Notable for the following components:   Alcohol, Ethyl (B) 198 (*)    All other components within normal limits  SARS CORONAVIRUS 2 BY RT PCR (HOSPITAL ORDER, PERFORMED IN [facility])  URINE DRUG SCREEN, QUALITATIVE ([facility] ONLY)     PROCEDURES  Procedure(s) performed (including Critical Care):  Procedures   ____________________________________________   INITIAL IMPRESSION / ASSESSMENT AND PLAN / ED COURSE       64 year old male with past medical history of hypertension, CAD, paroxysmal A. fib, and alcohol abuse presents to the ED requesting detox from alcohol as well as crack cocaine.  He currently denies any suicidal or homicidal ideation and there is no indication for psychiatric consultation.  Lab work shows elevated blood alcohol but is otherwise reassuring and patient may be medically cleared for potential detox placement.  We will start him on CIWA protocol and consult TTS.  The patient has been placed in psychiatric  observation due to the need to provide a safe environment for the patient while obtaining psychiatric consultation and evaluation, as well as ongoing medical and medication management to treat the patient's condition.  The patient has not been placed under full IVC at this time.       ____________________________________________   FINAL CLINICAL IMPRESSION(S) / ED DIAGNOSES  Final diagnoses:  Alcohol abuse     ED Discharge Orders    None       Note:  This document was prepared using Dragon voice recognition software and may include unintentional dictation errors.   Chesley Noon, MD 01/28/20 [phone]

## 2020-01-28 NOTE — ED Triage Notes (Signed)
Patient states that he is here for help with detox from alcohol. Patient states that he has spoken with [facility] and they have a bed for him. Patient states that he drinks a lot and has been drinking for years.

## 2020-01-29 LAB — SARS CORONAVIRUS 2 BY RT PCR (HOSPITAL ORDER, PERFORMED IN ~~LOC~~ HOSPITAL LAB): SARS Coronavirus 2: NEGATIVE

## 2020-01-29 MED ORDER — RIVAROXABAN 20 MG PO TABS
20.0000 mg | ORAL_TABLET | Freq: Every day | ORAL | Status: DC
  Filled 2020-01-29: qty 1

## 2020-01-29 MED ORDER — ASPIRIN EC 81 MG PO TBEC
81.0000 mg | DELAYED_RELEASE_TABLET | Freq: Every day | ORAL | Status: DC
  Administered 2020-01-29: 81 mg via ORAL
  Filled 2020-01-29: qty 1

## 2020-01-29 MED ORDER — FLECAINIDE ACETATE 100 MG PO TABS
100.0000 mg | ORAL_TABLET | Freq: Two times a day (BID) | ORAL | Status: DC
  Filled 2020-01-29 (×2): qty 1

## 2020-01-29 MED ORDER — METOPROLOL TARTRATE 50 MG PO TABS
50.0000 mg | ORAL_TABLET | Freq: Two times a day (BID) | ORAL | Status: DC
  Administered 2020-01-29: 50 mg via ORAL
  Filled 2020-01-29: qty 1

## 2020-01-29 MED ORDER — ASPIRIN EC 81 MG PO TBEC: 81.0000 mg | DELAYED_RELEASE_TABLET | Freq: Every day | ORAL | 0 refills | Status: AC

## 2020-01-29 MED ORDER — METOPROLOL TARTRATE 25 MG PO TABS: 50.0000 mg | ORAL_TABLET | Freq: Two times a day (BID) | ORAL | 0 refills | Status: DC

## 2020-01-29 MED ORDER — RIVAROXABAN 20 MG PO TABS: 20.0000 mg | ORAL_TABLET | Freq: Every day | ORAL | 0 refills | Status: DC

## 2020-01-29 MED ORDER — THIAMINE HCL 100 MG PO TABS: 100.0000 mg | ORAL_TABLET | Freq: Every day | ORAL | 0 refills | Status: AC

## 2020-01-29 MED ORDER — ROSUVASTATIN CALCIUM 20 MG PO TABS
20.0000 mg | ORAL_TABLET | Freq: Every day | ORAL | Status: DC
  Administered 2020-01-29: 20 mg via ORAL
  Filled 2020-01-29: qty 1

## 2020-01-29 MED ORDER — FLECAINIDE ACETATE 100 MG PO TABS: 100.0000 mg | ORAL_TABLET | Freq: Two times a day (BID) | ORAL | 0 refills | Status: DC

## 2020-01-29 MED ORDER — ROSUVASTATIN CALCIUM 20 MG PO TABS: 20.0000 mg | ORAL_TABLET | Freq: Every day | ORAL | 0 refills | Status: DC

## 2020-01-29 NOTE — ED Provider Notes (Signed)
Emergency Medicine Observation Re-evaluation Note  Darren Rogers is a 64 y.o. male, seen on rounds today.  Pt initially presented to the ED for complaints of Alcohol Problem Currently, the patient is resting comfortably, voices no complaints.  Physical Exam  BP 140/79 (BP Location: Right Arm)   Pulse (!) 108   Temp 98.6 F (37 C) (Oral)   Resp 15   Ht 5\' 6"  (1.676 m)   Wt 70.3 kg   SpO2 96%   BMI 25.02 kg/m  Physical Exam General: Resting in no acute distress Cardiac: No cyanosis Lungs: Equal rise and fall Psych: Normal  ED Course / MDM  EKG:    I have reviewed the labs performed to date as well as medications administered while in observation.  Recent changes in the last 24 hours include no acute changes overnight.  Plan  Current plan is for [facility] this morning. Patient is not under full IVC at this time.   , MD 01/29/20 [phone]

## 2020-01-29 NOTE — BH Assessment (Addendum)
TTS was contacted by Darren Rogers ([facility]) confirming pt's admission's to for 1pm today. Darren Rogers request pt bring a week supply of medications or will not be admitted.   TTS informed provider and current RN of Darren Rogers's request. TTS also, confirmed [facility] plans to provide transportation.

## 2020-01-29 NOTE — Discharge Instructions (Addendum)
Please comply with [facility] meds instructions.

## 2020-01-29 NOTE — BH Assessment (Signed)
Assessment Note  Darren Rogers is an 64 y.o. male who presents to the ER seeking assistance with alcohol detox. Prior to arriving to the ER, he already arrange to be admitted with [facility]. Patient reports of daily alcohol use for approximately three months. He drinks approximately five, forty ounce beers. He also reports of cocaine use, once week. Prior to recent relapse, he was for sober eight years.  Current withdrawal symptoms includes; decrease sleep, anxiety and restlessness. He denies history of seizures and blackouts.  During interview, he was calm, cooperative and pleasant. He was able to provide appropriate answers to the questions. Throughout the interview, he denied SI/HI and AV/H.  Diagnosis: Alcohol Use Disorder  Past Medical History:  Past Medical History:  Diagnosis Date  . Claudication (HCC)    a. 06/2015 ABI: R - 0.73, L - 0.73. 30-49% bilat SFA stenosis. 50-74% R Profunda stenosis; b. 01/2017 ABI: R 0.89, L 0.88, TBI R 0.65, L 1.0.  . Erectile dysfunction   . Essential hypertension   . History of echocardiogram    a. 03/29/2014: EF 55-60%, mild LVH, normal RVSP;  b. 05/2015 Echo: EF 60-65%, no rwma, triv AI, mild MR, mildly dil LA.  Marland Kitchen Hyperlipidemia   . Non-obstructive CAD    a. 01/27/2014 Cath: LM nl, LAD mild diff dz w/o obs, LCx no sig obs - scattered 20-30% mLCx, RCA no obs dz, EF 70%; b. 06/2015 MV; EF 60%, no nischemia.  Marland Kitchen PAF (paroxysmal atrial fibrillation) (HCC)    a. Pt says Dx >71yrs ago w/ ? RFCA @ [facility];  b. Recurrent 12/2013;  b. Rx Flecainide and Xarelto-->Intermittent compliance.  Marland Kitchen PVC's (premature ventricular contractions)    a. 05/2017 Zio: 4000 PVCs in 48 hrs (2%). Brief run of SVT.  . Tobacco abuse    a. ongoing - 1 ppd.    Past Surgical History:  Procedure Laterality Date  . CARDIAC CATHETERIZATION    . [facility]     [facility]  . LEFT HEART CATH Right 01/27/2014   Procedure: LEFT HEART CATH;  Surgeon: Micheline Chapman, MD;  Location: [facility] C[facility] LAB;   Service: Cardiovascular;  Laterality: Right;    Family History:  Family History  Problem Relation Age of Onset  . Coronary artery disease Father 92  . Diabetes Mother   . Diabetes Sister   . Diabetes Brother   . Diabetes Maternal Grandmother   . Diabetes Sister   . Diabetes Sister     Social History:  reports that he has been smoking cigarettes. He has a 15.00 pack-year smoking history. He has never used smokeless tobacco. He reports current alcohol use. He reports current drug use. Drug: Cocaine.  Additional Social History:  Alcohol / Drug Use Pain Medications: See PTA Prescriptions: See PTA Over the Counter: See PTA History of alcohol / drug use?: Yes Longest period of sobriety (when/how long): Eight years Substance #1 Name of Substance 1: Alcohol 1 - Amount (size/oz): 5-40oz beers 1 - Frequency: Daily 1 - Duration: "Last three months" 1 - Last Use / Amount: 01/29/2020 Substance #2 Name of Substance 2: Cocaine 2 - Amount (size/oz): $40 2 - Frequency: Once a week 2 - Duration: Three months 2 - Last Use / Amount: Unable to quantify  CIWA: CIWA-Ar BP: 140/79 Pulse Rate: (!) 108 Nausea and Vomiting: mild nausea with no vomiting Tactile Disturbances: very mild itching, pins and needles, burning or numbness Tremor: two Auditory Disturbances: not present Paroxysmal Sweats: barely perceptible sweating, palms moist  Visual Disturbances: not present Anxiety: moderately anxious, or guarded, so anxiety is inferred Headache, Fullness in Head: very mild Agitation: somewhat more than normal activity Orientation and Clouding of Sensorium: oriented and can do serial additions CIWA-Ar Total: 11 COWS:    Allergies: No Known Allergies  Home Medications: (Not in a hospital admission)   OB/GYN Status:  No LMP for male patient.  General Assessment Data Location of Assessment: [facility] ED TTS Assessment: In system Is this a Tele or Face-to-Face Assessment?: Face-to-Face Is this  an Initial Assessment or a Re-assessment for this encounter?: Initial Assessment Patient Accompanied by:: N/A Language Other than English: No Living Arrangements: Other (Comment) (Private Home) What gender do you identify as?: Male Date Telepsych consult ordered in [facility]: 01/28/20 Time Telepsych consult ordered in [facility]: 2251 Marital status: Single Pregnancy Status: No Living Arrangements: Other relatives Can pt return to current living arrangement?: Yes Admission Status: Voluntary Is patient capable of signing voluntary admission?: Yes Referral Source: Self/Family/Friend  Medical Screening Exam [facility] Walk-in ONLY) Medical Exam completed: Yes  Crisis Care Plan Living Arrangements: Other relatives Legal Guardian: Other: (Self) Name of Psychiatrist: Reports of none Name of Therapist: Reports of none  Education Status Is patient currently in school?: No Is the patient employed, unemployed or receiving disability?: Unemployed  Risk to self with the past 6 months Suicidal Ideation: No Has patient been a risk to self within the past 6 months prior to admission? : No Suicidal Intent: No Has patient had any suicidal intent within the past 6 months prior to admission? : No Is patient at risk for suicide?: No Suicidal Plan?: No Has patient had any suicidal plan within the past 6 months prior to admission? : No Access to Means: No What has been your use of drugs/alcohol within the last 12 months?: Alcohol & Cocaine Previous Attempts/Gestures: No How many times?: 0 Other Self Harm Risks: Active alcohol use Triggers for Past Attempts: None known Intentional Self Injurious Behavior: None Family Suicide History: Unknown Recent stressful life event(s): Other (Comment) (Relapse) Persecutory voices/beliefs?: No Depression: Yes Depression Symptoms: Insomnia, Guilt Substance abuse history and/or treatment for substance abuse?: Yes Suicide prevention information given to non-admitted patients:  Not applicable  Risk to Others within the past 6 months Homicidal Ideation: No Does patient have any lifetime risk of violence toward others beyond the six months prior to admission? : No Thoughts of Harm to Others: No Current Homicidal Intent: No Current Homicidal Plan: No Access to Homicidal Means: No Identified Victim: Reports of none History of harm to others?: No Assessment of Violence: None Noted Violent Behavior Description: Reports of none Does patient have access to weapons?: No Criminal Charges Pending?: No Does patient have a court date: No Is patient on probation?: No  Psychosis Hallucinations: None noted Delusions: None noted  Mental Status Report Appearance/Hygiene: Unremarkable, In scrubs Eye Contact: Good Motor Activity: Freedom of movement, Unremarkable Speech: Logical/coherent, Unremarkable Level of Consciousness: Alert Mood: Depressed, Sad, Pleasant Affect: Appropriate to circumstance, Depressed, Sad Anxiety Level: Minimal Thought Processes: Coherent, Relevant Judgement: Partial Orientation: Person, Place, Time, Situation, Appropriate for developmental age Obsessive Compulsive Thoughts/Behaviors: None  Cognitive Functioning Concentration: Normal Memory: Recent Intact, Remote Intact Is patient IDD: No Insight: Fair Impulse Control: Fair Appetite: Fair Have you had any weight changes? : No Change Sleep: Decreased Total Hours of Sleep: 2 Vegetative Symptoms: None  ADLScreening [facility] Assessment Services) Patient's cognitive ability adequate to safely complete daily activities?: Yes Patient able to express need for assistance with  ADLs?: Yes Independently performs ADLs?: Yes (appropriate for developmental age)  Prior Inpatient Therapy Prior Inpatient Therapy: No  Prior Outpatient Therapy Prior Outpatient Therapy: No Does patient have an ACCT team?: No Does patient have Intensive In-House Services?  : No Does patient have [facility] services? :  No Does patient have P4CC services?: No  ADL Screening (condition at time of admission) Patient's cognitive ability adequate to safely complete daily activities?: Yes Is the patient deaf or have difficulty hearing?: No Does the patient have difficulty seeing, even when wearing glasses/contacts?: No Does the patient have difficulty concentrating, remembering, or making decisions?: No Patient able to express need for assistance with ADLs?: Yes Does the patient have difficulty dressing or bathing?: No Independently performs ADLs?: Yes (appropriate for developmental age) Does the patient have difficulty walking or climbing stairs?: No Weakness of Legs: None Weakness of Arms/Hands: None  Home Assistive Devices/Equipment Home Assistive Devices/Equipment: None  Therapy Consults (therapy consults require a physician order) PT Evaluation Needed: No OT Evalulation Needed: No SLP Evaluation Needed: No Abuse/Neglect Assessment (Assessment to be complete while patient is alone) Abuse/Neglect Assessment Can Be Completed: Yes Physical Abuse: Denies Verbal Abuse: Denies Sexual Abuse: Denies Exploitation of patient/patient's resources: Denies Self-Neglect: Denies Values / Beliefs Cultural Requests During Hospitalization: None Spiritual Requests During Hospitalization: None Consults Spiritual Care Consult Needed: No Transition of Care Team Consult Needed: No Advance Directives (For Healthcare) Does Patient Have a Medical Advance Directive?: No  Disposition:  Disposition Initial Assessment Completed for this Encounter: Yes  On Site Evaluation by:   Reviewed with Physician:    Lilyan Gilford MS, LCAS, [facility], NCC Therapeutic Triage Specialist 01/29/2020 1:53 AM

## 2020-01-29 NOTE — BH Assessment (Signed)
TTS spoke to [place] ([facility]) who confirmed to have pt's medication situation worked out and plans for another staff member to return at [employer] today to pick him up.

## 2020-01-29 NOTE — ED Notes (Signed)
Pt discharging and going to [facility] for detox. Discharge teaching done and pt verbalized understanding.Pt given all his personal belongings including a pocket knife that was handed to him by security officer Yonie at the lobby as he was walking out. Ambulating with steady gait and in NAD.

## 2020-01-29 NOTE — BH Assessment (Signed)
Writer spoke with [facility] ([phone]). He states they are aware of the patient and will have a bed for him in the morning (01/29/2020), but advised to call in the morning for a the time of arrival.

## 2020-01-29 NOTE — TOC Initial Note (Addendum)
Transition of Care [facility]) - Initial/Assessment Note    Patient Details  Name: Darren Rogers MRN: [id number] Date of Birth: Apr 17, 1956  [facility] of [facility]) CM/SW Contact:    [place] Cellar, RN Phone Number: 01/29/2020, 2:15 PM  Clinical Narrative:                 Spoke to sister, Jari Sportsman who states patient has been going between her and her sisters house living since he started back drinking and drugs. Patient is planning to discharge to [facility] and needs prescription for 7 days. Sister has numerous bottles of prescriptions that belong to patient however they are all dated 05/2019. Sister confirmed patient has [employer] but she does not know which one he has. 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   Notified by ED RN patient was needing prescription for 7 days prior to discharge to [facility]. Confirmed prescriptions can be sent to [facility] at [facility].   RN CM attempted to [employer] listed and telephone number for prior authorization is not a working number. Patient will need to take insurance card to pharmacy.         Patient Goals and CMS Choice        Expected Discharge Plan and Services                                                Prior Living Arrangements/Services                       Activities of Daily Living Home Assistive Devices/Equipment: None ADL Screening (condition at time of admission) Patient's cognitive ability adequate to safely complete daily activities?: Yes Is the patient deaf or have difficulty hearing?: No Does the patient have difficulty seeing, even when wearing glasses/contacts?: No Does the patient have difficulty concentrating, remembering, or making decisions?: No Patient able to express need for assistance with ADLs?: Yes Does the patient have difficulty dressing or bathing?: No Independently performs  ADLs?: Yes (appropriate for developmental age) Does the patient have difficulty walking or climbing stairs?: No Weakness of Legs: None Weakness of Arms/Hands: None  Permission Sought/Granted                  Emotional Assessment              Admission diagnosis:  Requests Rehab Patient Active Problem List   Diagnosis Date Noted  . Alcohol abuse 10/20/2019  . Depression 10/20/2019  . A-fib (HCC) 01/15/2017  . Personal history of tobacco use, presenting hazards to health 08/17/2016  . BPH associated with nocturia 07/30/2016  . IFG (impaired fasting glucose) 07/30/2016  . Essential hypertension   . Claudication (HCC)   . 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 CAD in native artery 04/11/2014  . Paroxysmal atrial fibrillation (HCC)   . Tobacco abuse   . Hyperlipidemia   . Atrial fibrillation with rapid ventricular response (HCC) 01/27/2014   PCP:  Patient, No Pcp Per Pharmacy:   [facility] [place], [place] - [address] [place] [address] Phone: [phone] Fax: [phone]     Social Determinants of Health (SDOH) Interventions    Readmission Risk Interventions No flowsheet data found.
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
## 2020-03-08 ENCOUNTER — Ambulatory Visit: Admitting: Physician Assistant

## 2020-03-11 ENCOUNTER — Encounter: Payer: Self-pay | Admitting: Physician Assistant

## 2020-04-01 ENCOUNTER — Ambulatory Visit: Admitting: Physician Assistant

## 2020-05-29 ENCOUNTER — Other Ambulatory Visit: Payer: Self-pay

## 2020-05-29 ENCOUNTER — Encounter: Payer: Self-pay | Admitting: *Deleted

## 2020-05-29 ENCOUNTER — Emergency Department: Payer: Medicare Other

## 2020-05-29 DIAGNOSIS — F1010 Alcohol abuse, uncomplicated: Secondary | ICD-10-CM | POA: Diagnosis not present

## 2020-05-29 DIAGNOSIS — F1020 Alcohol dependence, uncomplicated: Secondary | ICD-10-CM | POA: Insufficient documentation

## 2020-05-29 DIAGNOSIS — K047 Periapical abscess without sinus: Secondary | ICD-10-CM | POA: Diagnosis not present

## 2020-05-29 DIAGNOSIS — F17210 Nicotine dependence, cigarettes, uncomplicated: Secondary | ICD-10-CM | POA: Diagnosis not present

## 2020-05-29 DIAGNOSIS — R079 Chest pain, unspecified: Secondary | ICD-10-CM | POA: Insufficient documentation

## 2020-05-29 DIAGNOSIS — Z7982 Long term (current) use of aspirin: Secondary | ICD-10-CM | POA: Insufficient documentation

## 2020-05-29 DIAGNOSIS — Z79899 Other long term (current) drug therapy: Secondary | ICD-10-CM | POA: Diagnosis not present

## 2020-05-29 DIAGNOSIS — I10 Essential (primary) hypertension: Secondary | ICD-10-CM | POA: Diagnosis not present

## 2020-05-29 DIAGNOSIS — I25110 Atherosclerotic heart disease of native coronary artery with unstable angina pectoris: Secondary | ICD-10-CM | POA: Insufficient documentation

## 2020-05-29 DIAGNOSIS — Z955 Presence of coronary angioplasty implant and graft: Secondary | ICD-10-CM | POA: Diagnosis not present

## 2020-05-29 LAB — CBC
HCT: 44.4 % (ref 39.0–52.0)
Hemoglobin: 15.4 g/dL (ref 13.0–17.0)
MCH: 33 pg (ref 26.0–34.0)
MCHC: 34.7 g/dL (ref 30.0–36.0)
MCV: 95.1 fL (ref 80.0–100.0)
Platelets: 287 10*3/uL (ref 150–400)
RBC: 4.67 MIL/uL (ref 4.22–5.81)
RDW: 14.8 % (ref 11.5–15.5)
WBC: 6.7 10*3/uL (ref 4.0–10.5)
nRBC: 0 % (ref 0.0–0.2)

## 2020-05-29 LAB — BASIC METABOLIC PANEL
Anion gap: 14 (ref 5–15)
BUN: 11 mg/dL (ref 8–23)
CO2: 25 mmol/L (ref 22–32)
Calcium: 9 mg/dL (ref 8.9–10.3)
Chloride: 99 mmol/L (ref 98–111)
Creatinine, Ser: 1.19 mg/dL (ref 0.61–1.24)
GFR, Estimated: >60 mL/min (ref >60)
Glucose, Bld: 120 mg/dL — ABNORMAL HIGH (ref 70–99)
Potassium: 4 mmol/L (ref 3.5–5.1)
Sodium: 138 mmol/L (ref 135–145)

## 2020-05-29 LAB — TROPONIN I (HIGH SENSITIVITY): Troponin I (High Sensitivity): 12 ng/L (ref <18)

## 2020-05-29 NOTE — ED Triage Notes (Signed)
Pt says had a left lower toothache for about 2 days. Today, says his heart rate increased and he went into afib, felt increased for about 15 minutes and was having chest pain. Pt has not been taking his medication for about a week.

## 2020-05-29 NOTE — ED Notes (Signed)
EKG shown to Darren smith, md, no STEMI.

## 2020-05-30 ENCOUNTER — Other Ambulatory Visit: Payer: Self-pay

## 2020-05-30 ENCOUNTER — Emergency Department
Admission: EM | Admit: 2020-05-30 | Discharge: 2020-05-31 | Disposition: A | Discharge status: Home / Self Care | Payer: Medicare Other | Attending: Emergency Medicine | Admitting: Emergency Medicine

## 2020-05-30 DIAGNOSIS — F1010 Alcohol abuse, uncomplicated: Secondary | ICD-10-CM | POA: Diagnosis not present

## 2020-05-30 DIAGNOSIS — F1020 Alcohol dependence, uncomplicated: Secondary | ICD-10-CM

## 2020-05-30 DIAGNOSIS — R079 Chest pain, unspecified: Secondary | ICD-10-CM

## 2020-05-30 DIAGNOSIS — K047 Periapical abscess without sinus: Secondary | ICD-10-CM

## 2020-05-30 LAB — TROPONIN I (HIGH SENSITIVITY): Troponin I (High Sensitivity): 15 ng/L (ref <18)

## 2020-05-30 MED ORDER — FLECAINIDE ACETATE 100 MG PO TABS
100.0000 mg | ORAL_TABLET | Freq: Once | ORAL | Status: AC
  Administered 2020-05-30: 100 mg via ORAL
  Filled 2020-05-30: qty 1

## 2020-05-30 MED ORDER — METOPROLOL TARTRATE 50 MG PO TABS
50.0000 mg | ORAL_TABLET | Freq: Once | ORAL | Status: AC
  Administered 2020-05-30: 50 mg via ORAL
  Filled 2020-05-30: qty 1

## 2020-05-30 MED ORDER — AMOXICILLIN-POT CLAVULANATE 875-125 MG PO TABS: 1.0000 | ORAL_TABLET | Freq: Two times a day (BID) | ORAL | 0 refills | Status: AC

## 2020-05-30 MED ORDER — AMOXICILLIN-POT CLAVULANATE 875-125 MG PO TABS
1.0000 | ORAL_TABLET | Freq: Once | ORAL | Status: AC
  Administered 2020-05-30: 1 via ORAL
  Filled 2020-05-30: qty 1

## 2020-05-30 MED ORDER — LIDOCAINE VISCOUS HCL 2 % MT SOLN
15.0000 mL | Freq: Once | OROMUCOSAL | Status: AC
  Administered 2020-05-30: 15 mL via OROMUCOSAL
  Filled 2020-05-30: qty 15

## 2020-05-30 MED ORDER — LIDOCAINE VISCOUS HCL 2 % MT SOLN: 15.0000 mL | OROMUCOSAL | 0 refills | Status: DC | PRN

## 2020-05-30 NOTE — ED Notes (Signed)
Pt requesting etoh detox, reported to MD that he relapsed around 2 years ago and would like help quitting. TTS consulted and waiting for eval at this [facility]

## 2020-05-30 NOTE — ED Provider Notes (Signed)
[facility] Emergency Department Provider Note  ____________________________________________  Time seen: Approximately 4:24 AM  I have reviewed the triage vital signs and the nursing notes.   HISTORY  Chief Complaint Addiction Problem   HPI Darren Rogers is a 64 y.o. male with a history of paroxysmal atrial fibrillation, hypertension, hyperlipidemia, CAD, smoking, alcohol abuse who presents for several concerns.  Initially patient called the ambulance because he felt that he went into A. fib and started having chest pain associated with it.  Episode lasted about 15 minutes and resolved without intervention.  The chest pain resolved immediately after the A. Fib resolved.  Patient at this time feel back to baseline.  He denies any recent illness, fever, shortness of breath, cough, vomiting, diarrhea.  Is also complaining of a tooth infection that he has had for couple of days.  No fever or chills, no trismus.  He is also requesting help from alcohol dependence.  Patient reports that he relapsed 2 years ago after a bad break-up.  Last EtOH was prior to arrival.  No history of complicated withdrawals.   Past Medical History:  Diagnosis Date  . Claudication (HCC)    a. 06/2015 ABI: R - 0.73, L - 0.73. 30-49% bilat SFA stenosis. 50-74% R Profunda stenosis; b. 01/2017 ABI: R 0.89, L 0.88, TBI R 0.65, L 1.0.  . Erectile dysfunction   . Essential hypertension   . History of echocardiogram    a. 03/29/2014: EF 55-60%, mild LVH, normal RVSP;  b. 05/2015 Echo: EF 60-65%, no rwma, triv AI, mild MR, mildly dil LA.  Marland Kitchen Hyperlipidemia   . Non-obstructive CAD    a. 01/27/2014 Cath: LM nl, LAD mild diff dz w/o obs, LCx no sig obs - scattered 20-30% mLCx, RCA no obs dz, EF 70%; b. 06/2015 MV; EF 60%, no nischemia.  Marland Kitchen PAF (paroxysmal atrial fibrillation) (HCC)    a. Pt says Dx >58yrs ago w/ ? RFCA @ [facility];  b. Recurrent 12/2013;  b. Rx Flecainide and Xarelto-->Intermittent compliance.  Marland Kitchen  PVC's (premature ventricular contractions)    a. 05/2017 Zio: 4000 PVCs in 48 hrs (2%). Brief run of SVT.  . Tobacco abuse    a. ongoing - 1 ppd.    Patient Active Problem List   Diagnosis Date Noted  . Alcohol abuse 10/20/2019  . Depression 10/20/2019  . A-fib (HCC) 01/15/2017  . Personal history of tobacco use, presenting hazards to health 08/17/2016  . BPH associated with nocturia 07/30/2016  . IFG (impaired fasting glucose) 07/30/2016  . Essential hypertension   . Claudication (HCC)   . 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 CAD in native artery 04/11/2014  . Paroxysmal atrial fibrillation (HCC)   . Tobacco abuse   . Hyperlipidemia   . Atrial fibrillation with rapid ventricular response (HCC) 01/27/2014    Past Surgical History:  Procedure Laterality Date  . CARDIAC CATHETERIZATION    . [facility]     [facility]  . LEFT HEART CATH Right 01/27/2014   Procedure: LEFT HEART CATH;  Surgeon: Micheline Chapman, MD;  Location: [facility] CATH LAB;  Service: Cardiovascular;  Laterality: Right;    Prior to Admission medications   Medication Sig Start Date End Date Taking? Authorizing Provider  amoxicillin-clavulanate (AUGMENTIN) 875-125 MG tablet Take 1 tablet by mouth 2 (two) times daily for 7 days. 05/30/20 06/06/20 Yes Francina Beery, [place], MD  lidocaine (XYLOCAINE) 2 % solution Use as directed 15 mLs in the mouth or throat as needed  for mouth pain. 05/30/20  Yes Don Perking, [place], MD  amoxicillin (AMOXIL) 500 MG capsule Take 1 capsule (500 mg total) by mouth 3 (three) times daily. 12/07/19   Enid Derry, PA-C  aspirin EC 81 MG tablet Take 1 tablet (81 mg total) by mouth daily. Patient not taking: Reported on 10/20/2019 08/16/19   Emily Filbert, MD  flecainide (TAMBOCOR) 100 MG tablet Take 1 tablet (100 mg total) by mouth every 12 (twelve) hours. Patient not taking: Reported on 01/01/2020 08/16/19   Emily Filbert, MD  flecainide (TAMBOCOR) 100 MG tablet Take 1  tablet (100 mg total) by mouth 2 (two) times daily for 14 days. 01/29/20 02/12/20  Delton Prairie, MD  lidocaine (XYLOCAINE) 2 % solution Use as directed 10 mLs in the mouth or throat as needed. 12/07/19   Enid Derry, PA-C  metoprolol tartrate (LOPRESSOR) 100 MG tablet Take 0.5 tablets (50 mg total) by mouth 2 (two) times daily. Patient not taking: Reported on 01/01/2020 08/16/19   Emily Filbert, MD  metoprolol tartrate (LOPRESSOR) 25 MG tablet Take 2 tablets (50 mg total) by mouth 2 (two) times daily for 14 days. 01/29/20 02/12/20  Delton Prairie, MD  rivaroxaban (XARELTO) 20 MG TABS tablet Take 1 tablet (20 mg total) by mouth daily with supper. Patient not taking: Reported on 10/20/2019 08/16/19   Emily Filbert, MD  rivaroxaban (XARELTO) 20 MG TABS tablet Take 1 tablet (20 mg total) by mouth daily with supper for 14 days. 01/29/20 02/12/20  Delton Prairie, MD  rosuvastatin (CRESTOR) 20 MG tablet Take 1 tablet (20 mg total) by mouth daily. Patient not taking: Reported on 10/20/2019 08/16/19   Emily Filbert, MD  rosuvastatin (CRESTOR) 20 MG tablet Take 1 tablet (20 mg total) by mouth daily for 14 days. 01/29/20 02/12/20  Delton Prairie, MD    Allergies Patient has no known allergies.  Family History  Problem Relation Age of Onset  . Coronary artery disease Father 84  . Diabetes Mother   . Diabetes Sister   . Diabetes Brother   . Diabetes Maternal Grandmother   . Diabetes Sister   . Diabetes Sister     Social History Social History   Tobacco Use  . Smoking status: Current Every Day Smoker    Packs/day: 0.50    Years: 30.00    Pack years: 15.00    Types: Cigarettes  . Smokeless tobacco: Never Used  Substance Use Topics  . Alcohol use: Yes  . Drug use: Yes    Types: Cocaine    Review of Systems  Constitutional: Negative for fever. Eyes: Negative for visual changes. ENT: Negative for sore throat. + dental pain Neck: No neck pain  Cardiovascular: + chest pain and  palpitations Respiratory: Negative for shortness of breath. Gastrointestinal: Negative for abdominal pain, vomiting or diarrhea. Genitourinary: Negative for dysuria. Musculoskeletal: Negative for back pain. Skin: Negative for rash. Neurological: Negative for headaches, weakness or numbness. Psych: No SI or HI  ____________________________________________   PHYSICAL EXAM:  VITAL SIGNS: ED Triage Vitals [05/29/20 2029]  Enc Vitals Group     BP 127/65     Pulse Rate (!) 109     Resp 16     Temp 98.7 F (37.1 C)     Temp Source Oral     SpO2 94 %     Weight      Height      Head Circumference      Peak Flow  Pain Score      Pain Loc      Pain Edu?      Excl. in GC?     Constitutional: Alert and oriented. Well appearing and in no apparent distress. HEENT:      Head: Normocephalic and atraumatic.         Eyes: Conjunctivae are normal. Sclera is non-icteric.       Mouth/Throat: Mucous membranes are moist. There is mild gum swelling and erythema just below the L lower molar, no obvious fluctuance or abscess seen.      Neck: Supple with no signs of meningismus. Cardiovascular: Regular rate and rhythm. No murmurs, gallops, or rubs. 2+ symmetrical distal pulses are present in all extremities.  Respiratory: Normal respiratory effort. Lungs are clear to auscultation bilaterally.  Gastrointestinal: Soft, non tender. Musculoskeletal:  No edema, cyanosis, or erythema of extremities. Neurologic: Normal speech and language. Face is symmetric. Moving all extremities. No gross focal neurologic deficits are appreciated. Skin: Skin is warm, dry and intact. No rash noted. Psychiatric: Mood and affect are normal. Speech and behavior are normal.  ____________________________________________   LABS (all labs ordered are listed, but only abnormal results are displayed)  Labs Reviewed  BASIC METABOLIC PANEL - Abnormal; Notable for the following components:      Result Value   Glucose,  Bld 120 (*)    All other components within normal limits  CBC  TROPONIN I (HIGH SENSITIVITY)  TROPONIN I (HIGH SENSITIVITY)   ____________________________________________  EKG  ED ECG REPORT I, Nita Sickle, the attending physician, personally viewed and interpreted this ECG.  Sinus tachycardia, rate of 104, normal intervals, normal axis, minimal ST elevations anteriorly with T wave inversions inferior and lateral leads.  Unchanged from prior ____________________________________________  RADIOLOGY  I have personally reviewed the images performed during this visit and I agree with the Radiologist's read.   Interpretation by Radiologist:  DG Chest 2 View  Result Date: 05/29/2020 CLINICAL DATA:  Chest pain with increased heart rate. Left lower toothache for 2 days. EXAM: CHEST - 2 VIEW COMPARISON:  01/01/2020 FINDINGS: Normal heart size and pulmonary vascularity. No focal airspace disease or consolidation in the lungs. No blunting of costophrenic angles. No pneumothorax. Mediastinal contours appear intact. Degenerative changes in the spine and shoulders. IMPRESSION: No active cardiopulmonary disease. Electronically Signed   By: Burman Nieves M.D.   On: 05/29/2020 21:00     ____________________________________________   PROCEDURES  Procedure(s) performed: yes .1-3 Lead EKG Interpretation Performed by: Nita Sickle, MD Authorized by: Nita Sickle, MD     Interpretation: abnormal     ECG rate assessment: tachycardic     Rhythm: sinus tachycardia     Ectopy: none     Critical Care performed:  None ____________________________________________   INITIAL IMPRESSION / ASSESSMENT AND PLAN / ED COURSE  64 y.o. male with a history of paroxysmal atrial fibrillation, hypertension, hyperlipidemia, CAD, smoking, alcohol abuse who presents for several concerns.  # short lived episode of CP and afib: back in NSR, EKG unchanged from baseline. Has been here for  8.5hrs in the waiting room and has not taken home meds. Will give a dose of his metoprolol and flecainide to prevent patient from going back into afib. HS-trop x 2 negative for any signs of ischemia.  Electrolytes within normal limits.  Chest x-ray with no signs of edema, visualized by me confirmed by radiology.  # dental pain: patient with early developing infection/ abscess, no drainable collection at this  time. Will treat with PO augmentin. Vicous lidocaine for pain. No trismus or infection of the floor of the mouth.  # alcohol dependence. No signs of active withdrawal at this time. Will consult TTS for detox options.   Old medical records reviewed.     _________________________ 6:13 AM on 05/30/2020 -----------------------------------------  Patient is medically clear awaiting possible inpatient placement for detox  _____________________________________________ Please note:  Patient was evaluated in Emergency Department today for the symptoms described in the history of present illness. Patient was evaluated in the context of the global COVID-19 pandemic, which necessitated consideration that the patient might be at risk for infection with the SARS-CoV-2 virus that causes COVID-19. Institutional protocols and algorithms that pertain to the evaluation of patients at risk for COVID-19 are in a state of rapid change based on information released by regulatory bodies including the CDC and federal and state organizations. These policies and algorithms were followed during the patient's care in the ED.  Some ED evaluations and interventions may be delayed as a result of limited staffing during the pandemic.   [place] Controlled Substance Database was reviewed by me. ____________________________________________   FINAL CLINICAL IMPRESSION(S) / ED DIAGNOSES   Final diagnoses:  Chest pain, unspecified type  Uncomplicated alcohol dependence (HCC)  Dental infection      NEW MEDICATIONS STARTED  DURING THIS VISIT:  ED Discharge Orders         Ordered    lidocaine (XYLOCAINE) 2 % solution  As needed        05/30/20 0613    amoxicillin-clavulanate (AUGMENTIN) 875-125 MG tablet  2 times daily        05/30/20 3300           Note:  This document was prepared using Dragon voice recognition software and may include unintentional dictation errors.    Nita Sickle, MD 05/30/20 [phone]

## 2020-05-30 NOTE — Consult Note (Signed)
[facility] Face-to-Face Psychiatry Consult   Reason for Consult: Consult for 64 year old man with a history of alcohol abuse who came in requesting detox Referring Physician: Paduchowski Patient Identification: Darren Rogers MRN:  [id number] Principal Diagnosis: Alcohol abuse Diagnosis:  Principal Problem:   Alcohol abuse   Total Time spent with patient: 1 hour  Subjective:   Darren Rogers is a 64 y.o. male patient admitted with "alcohol abuse".  HPI: Patient seen chart reviewed.  Patient came to the emergency room last night initially because he was feeling a fluttering sensation in his chest and thought that he had gone into a heart arrhythmia.  Once that was clarified however he reported that he needed help with alcohol abuse.  Patient says he consumes approximately 4 or 5 40 ounce beers per day and has been doing so for about a year maybe a little longer.  That was when he relapsed after 8 years of sobriety.  Uses cocaine occasionally maybe once a week.  Denies other drug use.  Mood is felt down and dysphoric and negative.  Little activity during the day.  Chronic poor sleep.  No suicidal thoughts or intent or plan no homicidal ideation.  Not currently having hallucinations.  Patient states that he wants to get back into a longer term sobriety.  Past Psychiatric History: Only prior psych admissions were for substance abuse treatment.  Has had inpatient rehab and detox admissions before.  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  Extensive history and familiarity with recovery.  No history of suicide attempts or violence  Risk to Self:   Risk to Others:   Prior Inpatient Therapy:   Prior Outpatient Therapy:    Past Medical History:  Past Medical History:  Diagnosis Date  . Claudication (HCC)    a. 06/2015 ABI: R - 0.73, L - 0.73. 30-49% bilat SFA stenosis. 50-74% R Profunda stenosis; b. 01/2017 ABI: R 0.89, L 0.88, TBI R 0.65, L 1.0.   . Erectile dysfunction   . Essential hypertension   . History of echocardiogram    a. 03/29/2014: EF 55-60%, mild LVH, normal RVSP;  b. 05/2015 Echo: EF 60-65%, no rwma, triv AI, mild MR, mildly dil LA.  [facility] Kitchen Hyperlipidemia   . Non-obstructive CAD    a. 01/27/2014 Cath: LM nl, LAD mild diff dz w/o obs, LCx no sig obs - scattered 20-30% mLCx, RCA no obs dz, EF 70%; b. 06/2015 MV; EF 60%, no nischemia.  Marland Kitchen PAF (paroxysmal atrial fibrillation) (HCC)    a. Pt says Dx >1yrs ago w/ ? RFCA @ [facility];  b. Recurrent 12/2013;  b. Rx Flecainide and Xarelto-->Intermittent compliance.  Marland Kitchen PVC's (premature ventricular contractions)    a. 05/2017 Zio: 4000 PVCs in 48 hrs (2%). Brief run of SVT.  . Tobacco abuse    a. ongoing - 1 ppd.    Past Surgical History:  Procedure Laterality Date  . CARDIAC CATHETERIZATION    . CARDIAC CATHETERIZATION     [facility]  . LEFT HEART CATH Right 01/27/2014   Procedure: LEFT HEART CATH;  Surgeon: Micheline Chapman, MD;  Location: [facility] CATH LAB;  Service: Cardiovascular;  Laterality: Right;   Family History:  Family History  Problem Relation Age of Onset  . Coronary artery disease Father 12  . Diabetes Mother   . Diabetes Sister   . Diabetes Brother   . Diabetes Maternal Grandmother   . Diabetes Sister   . Diabetes  Sister    Family Psychiatric  History: None reported Social History:  Social History   Substance and Sexual Activity  Alcohol Use Yes     Social History   Substance and Sexual Activity  Drug Use Yes  . Types: Cocaine    Social History   Socioeconomic History  . Marital status: Single    Spouse name: Not on file  . Number of children: Not on file  . Years of education: Not on file  . Highest education level: Not on file  Occupational History  . Not on file  Tobacco Use  . Smoking status: Current Every Day Smoker    Packs/day: 0.50    Years: 30.00    Pack years: 15.00    Types: Cigarettes  . Smokeless tobacco: Never Used  Substance and Sexual  Activity  . Alcohol use: Yes  . Drug use: Yes    Types: Cocaine  . Sexual activity: Not on file  Other Topics Concern  . Not on file  Social History Narrative   The patient lives in [place] with his sister. He works in a substance abuse program. 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 He is a long-time smoker, one pack per day. No illicit drugs. He is not married.   Social Determinants of Health   Financial Resource Strain: Not on file  Food Insecurity: Not on file  Transportation Needs: Not on file  Physical Activity: Not on file  Stress: Not on file  Social Connections: Not on file   Additional Social History:    Allergies:  No Known Allergies  Labs:  Results for orders placed or performed during the hospital encounter of 05/30/20 (from the past 48 hour(s))  Basic metabolic panel     Status: Abnormal   Collection Time: 05/29/20  8:45 PM  Result Value Ref Range   Sodium 138 135 - 145 mmol/L   Potassium 4.0 3.5 - 5.1 mmol/L   Chloride 99 98 - 111 mmol/L   CO2 25 22 - 32 mmol/L   Glucose, Bld 120 (H) 70 - 99 mg/dL    Comment: Glucose reference range applies only to samples taken after fasting for at least 8 hours.   BUN 11 8 - 23 mg/dL   Creatinine, Ser 5.68 0.61 - 1.24 mg/dL   Calcium 9.0 8.9 - 12.7 mg/dL   GFR, Estimated >51 >70 mL/min    Comment: (NOTE) Calculated using the CKD-EPI Creatinine Equation (2021)    Anion gap 14 5 - 15    Comment: Performed at [facility], [address] [place], [place] [address]  CBC     Status: None   Collection Time: 05/29/20  8:45 PM  Result Value Ref Range   WBC 6.7 4.0 - 10.5 K/uL   RBC 4.67 4.22 - 5.81 MIL/uL   Hemoglobin 15.4 13.0 - 17.0 g/dL   HCT 44.9 67.5 - 91.6 %   MCV 95.1 80.0 - 100.0 fL   MCH 33.0 26.0 - 34.0 pg   MCHC 34.7 30.0 - 36.0 g/dL   RDW 38.4 66.5 - 99.3 %   Platelets 287 150 - 400 K/uL   nRBC 0.0 0.0 - 0.2 %    Comment: Performed at [facility], [address], [place], [place] [address]  Troponin I (High Sensitivity)     Status: None   Collection Time: 05/29/20  8:45 PM  Result Value Ref Range   Troponin  I (High Sensitivity) 12 <18 ng/L    Comment: (NOTE) Elevated high sensitivity troponin I (hsTnI) values and significant  changes across serial measurements may suggest ACS but many other  chronic and acute conditions are known to elevate hsTnI results.  Refer to the "Links" section for chest pain algorithms and additional  guidance. Performed at [facility], [address] [place], [place] [address]   Troponin I (High Sensitivity)     Status: None   Collection Time: 05/29/20 11:51 PM  Result Value Ref Range   Troponin I (High Sensitivity) 15 <18 ng/L    Comment: (NOTE) Elevated high sensitivity troponin I (hsTnI) values and significant  changes across serial measurements may suggest ACS but many other  chronic and acute conditions are known to elevate hsTnI results.  Refer to the "Links" section for chest pain algorithms and additional  guidance. Performed at [facility], [address] [place], [place] [address]     No current facility-administered medications for this encounter.   Current Outpatient Medications  Medication Sig Dispense Refill  . amoxicillin-clavulanate (AUGMENTIN) 875-125 MG tablet Take 1 tablet by mouth 2 (two) times daily for 7 days. 14 tablet 0  . lidocaine (XYLOCAINE) 2 % solution Use as directed 15 mLs in the mouth or throat as needed for mouth pain. 150 mL 0  . amoxicillin (AMOXIL) 500 MG capsule Take 1 capsule (500 mg total) by mouth 3 (three) times daily. 30 capsule 0  . aspirin EC 81 MG tablet Take 1 tablet (81 mg total) by mouth daily. (Patient not taking: Reported on 10/20/2019) 30 tablet 1  . flecainide (TAMBOCOR) 100 MG tablet Take 1 tablet (100 mg total) by mouth every 12 (twelve) hours. (Patient not taking: Reported on 01/01/2020) 60 tablet 1  . flecainide  (TAMBOCOR) 100 MG tablet Take 1 tablet (100 mg total) by mouth 2 (two) times daily for 14 days. 28 tablet 0  . lidocaine (XYLOCAINE) 2 % solution Use as directed 10 mLs in the mouth or throat as needed. 100 mL 0  . metoprolol tartrate (LOPRESSOR) 100 MG tablet Take 0.5 tablets (50 mg total) by mouth 2 (two) times daily. (Patient not taking: Reported on 01/01/2020) 30 tablet 1  . metoprolol tartrate (LOPRESSOR) 25 MG tablet Take 2 tablets (50 mg total) by mouth 2 (two) times daily for 14 days. 56 tablet 0  . rivaroxaban (XARELTO) 20 MG TABS tablet Take 1 tablet (20 mg total) by mouth daily with supper. (Patient not taking: Reported on 10/20/2019) 90 tablet 3  . rivaroxaban (XARELTO) 20 MG TABS tablet Take 1 tablet (20 mg total) by mouth daily with supper for 14 days. 14 tablet 0  . rosuvastatin (CRESTOR) 20 MG tablet Take 1 tablet (20 mg total) by mouth daily. (Patient not taking: Reported on 10/20/2019) 30 tablet 1  . rosuvastatin (CRESTOR) 20 MG tablet Take 1 tablet (20 mg total) by mouth daily for 14 days. 14 tablet 0    Musculoskeletal: Strength & Muscle Tone: within normal limits Gait & Station: normal Patient leans: N/A  Psychiatric Specialty Exam: Physical Exam Vitals and nursing note reviewed.  Constitutional:      Appearance: He is well-developed and well-nourished.  HENT:     Head: Normocephalic and atraumatic.  Eyes:     Conjunctiva/sclera: Conjunctivae normal.     Pupils: Pupils are equal, round, and reactive to light.  Cardiovascular:     Heart sounds: Normal heart sounds.  Pulmonary:  Effort: Pulmonary effort is normal.  Abdominal:     Palpations: Abdomen is soft.  Musculoskeletal:        General: Normal range of motion.     Cervical back: Normal range of motion.  Skin:    General: Skin is warm and dry.  Neurological:     General: No focal deficit present.     Mental Status: He is alert.  Psychiatric:        Attention and Perception: Attention normal.        Mood  and Affect: Mood is depressed.        Speech: Speech is delayed.        Behavior: Behavior is slowed.        Thought Content: Thought content is not paranoid or delusional. Thought content does not include homicidal or suicidal ideation.        Cognition and Memory: Cognition normal.        Judgment: Judgment normal.     Review of Systems  Constitutional: Negative.   HENT: Negative.   Eyes: Negative.   Respiratory: Negative.   Cardiovascular: Negative.   Gastrointestinal: Negative.   Musculoskeletal: Negative.   Skin: Negative.   Neurological: Negative.   Psychiatric/Behavioral: Positive for decreased concentration and dysphoric mood. Negative for suicidal ideas.    Blood pressure (!) 157/81, pulse 71, temperature 98 F (36.7 C), temperature source Oral, resp. rate 16, SpO2 100 %.There is no height or weight on file to calculate BMI.  General Appearance: Casual  Eye Contact:  Good  Speech:  Clear and Coherent  Volume:  Normal  Mood:  Dysphoric  Affect:  Congruent  Thought Process:  Goal Directed  Orientation:  Full (Time, Place, and Person)  Thought Content:  Logical  Suicidal Thoughts:  No  Homicidal Thoughts:  No  Memory:  Immediate;   Fair Recent;   Fair Remote;   Fair  Judgement:  Fair  Insight:  Fair  Psychomotor Activity:  Decreased  Concentration:  Concentration: Fair  Recall:  [employer] of Knowledge:  Fair  Language:  Fair  Akathisia:  No  Handed:  Right  AIMS (if indicated):     Assets:  Desire for Improvement Housing Resilience Social Support  ADL's:  Intact  Cognition:  WNL  Sleep:        Treatment Plan Summary: Plan Patient who has been drinking heavily requests detox treatment.  No history of seizures or DTs.  Reports a history of a lot of tremulousness with withdrawal and difficulty stopping on his own.  Currently cooperative with treatment not suicidal not aggressive.  Patient has been referred out for detox to [facility] and we await a  reply for them.  Detox orders in place.  Patient will be updated when we know the plan.  Disposition: Discussed crisis plan, support from social network, calling 911, coming to the Emergency Department, and calling Suicide Hotline.  Mordecai Rasmussen, MD 05/30/2020 1:24 PM

## 2020-05-30 NOTE — BH Assessment (Addendum)
Referral information for Detox Treatment faxed to;  [facility] [phone]) Patient previously faxed at 5:51am on 12/30, per staff patient was supposed to be under review on 05/30/20, TTS contacted back on 05/31/20 at 1:00am and staff reports that patient will be reviewed later today.  Added facility:   [facility] ([phone])- faxed referral at 10:00pm 05/30/20, staff reports to contact back when admissions staff are available after 8am   Patient reports that he is willing to wait another day to get an update from either facility.

## 2020-05-30 NOTE — ED Notes (Signed)
Pt called RN over, stating he has onset of left sided "stabbing" chest pain 8/10 for about 5 minutes, appears SOB, diaphoretic. Brought back to triage for reassessment.

## 2020-05-30 NOTE — BH Assessment (Addendum)
Referral information for Detox Treatment faxed to;     [facility] [phone]) Contacted facility and spoke with intake staff who reports patient will be reviewed today and will contact back. TTS to follow-up tonight   Patient is aware that he will have to wait until tonight after 7pm for an update due to no day shift TTS

## 2020-05-30 NOTE — BH Assessment (Signed)
Comprehensive Clinical Assessment (CCA) Note  05/30/2020 Darren Rogers [id number]  Chief Complaint: Patient is a 64 year old male presenting to [facility] ED voluntarily seeking detox for alcohol. During assessment patient presents alert and oriented x4, calm and cooperative. Patient reports that he has been drinking alcohol daily "4-5 40oz beers" for about a year. Patient reports he last drank yesterday 05/29/20. Patient has a history of alcohol use and reports being clean for 8 years prior but reports recently retiring and breaking up with a girlfriend that triggered him to start drinking again. Patient also reports some Cocaine use via smoking. Patient reports using "$20 worth" and reports using cocaine "once a week." Patient reports he last used Cocaine 4 days ago. Patient reports current withdrawal symptoms including "shakes, upset stomach, lack of sleep, and poor appetite when I can eat." Patient denies any current SI/HI/AH/VH and does not appear to be responding to any internal or external stimuli.   Patient is requesting detox treatment Chief Complaint  Patient presents with  . Addiction Problem   Visit Diagnosis: Alcohol Use Disorder, Severe. Cocaine Use   CCA Screening, Triage and Referral (STR)  Patient Reported Information How did you hear about [place]? Self  Referral name: No data recorded Referral phone number: No data recorded  Whom do you see for routine medical problems? No data recorded Practice/Facility Name: No data recorded Practice/Facility Phone Number: No data recorded Name of Contact: No data recorded Contact Number: No data recorded Contact Fax Number: No data recorded Prescriber Name: No data recorded Prescriber Address (if known): No data recorded  What Is the Reason for Your Visit/Call Today? Alcohol Detox  How Long Has This Been Causing You Problems? > than 6 months  What Do You Feel Would Help You the Most Today? Assessment Only   Have You Recently Been in  Any Inpatient Treatment (Hospital/Detox/Crisis Center/28-Day Program)? No  Name/Location of Program/Hospital:No data recorded How Long Were You There? No data recorded When Were You Discharged? No data recorded  Have You Ever Received Services From [facility] Before? No  Who Do You See at [facility]? No data recorded  Have You Recently Had Any Thoughts About Hurting Yourself? No  Are You Planning to Commit Suicide/Harm Yourself At This time? No   Have you Recently Had Thoughts About Hurting Someone Karolee Ohs? No  Explanation: No data recorded  Have You Used Any Alcohol or Drugs in the Past 24 Hours? Yes  How Long Ago Did You Use Drugs or Alcohol? 3614  What Did You Use and How Much? Alcohol, 4-5 40oz beers   Do You Currently Have a Therapist/Psychiatrist? No  Name of Therapist/Psychiatrist: No data recorded  Have You Been Recently Discharged From Any Office Practice or Programs? No  Explanation of Discharge From Practice/Program: No data recorded    CCA Screening Triage Referral Assessment Type of Contact: Face-to-Face  Is this Initial or Reassessment? No data recorded Date Telepsych consult ordered in [facility]:  01/28/2020  Time Telepsych consult ordered in [facility]:  2251   Patient Reported Information Reviewed? Yes  Patient Left Without Being Seen? No data recorded Reason for Not Completing Assessment: No data recorded  Collateral Involvement: No data recorded  Does Patient Have a Court Appointed Legal Guardian? No data recorded Name and Contact of Legal Guardian: Self  If Minor and Not Living with Parent(s), Who has Custody? Self  Is CPS involved or ever been involved? Never  Is APS involved or ever been involved? Never  Patient Determined To Be At Risk for Harm To Self or Others Based on Review of Patient Reported Information or Presenting Complaint? No  Method: No data recorded Availability of Means: No data recorded Intent: No data recorded Notification  Required: No data recorded Additional Information for Danger to Others Potential: No data recorded Additional Comments for Danger to Others Potential: No data recorded Are There Guns or Other Weapons in Your Home? No data recorded Types of Guns/Weapons: No data recorded Are These Weapons Safely Secured?                            No data recorded Who Could Verify You Are Able To Have These Secured: No data recorded Do You Have any Outstanding Charges, Pending Court Dates, Parole/Probation? No data recorded Contacted To Inform of Risk of Harm To Self or Others: No data recorded  Location of Assessment: [facility] ED   Does Patient Present under Involuntary Commitment? No  IVC Papers Initial File Date: No data recorded  [place] of Residence: [place]   Patient Currently Receiving the Following Services: No data recorded  Determination of Need: Emergent (2 hours)   Options For Referral: No data recorded    CCA Biopsychosocial Intake/Chief Complaint:  Alcohol Detox  Current Symptoms/Problems: No data recorded  Patient Reported Schizophrenia/Schizoaffective Diagnosis in Past: No   Strengths: UTA  Preferences: UTA  Abilities: UTA   Type of Services Patient Feels are Needed: UTA   Initial Clinical Notes/Concerns: None   Mental Health Symptoms Depression:  None   Duration of Depressive symptoms: No data recorded  Mania:  None   Anxiety:   None   Psychosis:  None   Duration of Psychotic symptoms: No data recorded  Trauma:  None   Obsessions:  None   Compulsions:  None   Inattention:  None   Hyperactivity/Impulsivity:  N/A   Oppositional/Defiant Behaviors:  None   Emotional Irregularity:  None   Other Mood/Personality Symptoms:  No data recorded   Mental Status Exam Appearance and self-care  Stature:  Average   Weight:  Average weight   Clothing:  Neat/clean   Grooming:  Normal   Cosmetic use:  None   Posture/gait:  Normal   Motor activity:   Not Remarkable   Sensorium  Attention:  Normal   Concentration:  Normal   Orientation:  X5   Recall/memory:  Normal   Affect and Mood  Affect:  Other (Comment)   Mood:  Other (Comment)   Relating  Eye contact:  Normal   Facial expression:  Responsive   Attitude toward examiner:  Cooperative   Thought and Language  Speech flow: Normal   Thought content:  Appropriate to Mood and Circumstances   Preoccupation:  None   Hallucinations:  None   Organization:  No data recorded  [employer] of Knowledge:  Average   Intelligence:  Average   Abstraction:  Normal   Judgement:  Good   Reality Testing:  Adequate   Insight:  Good   Decision Making:  Normal   Social Functioning  Social Maturity:  Responsible   Social Judgement:  Normal   Stress  Stressors:  Other (Comment)   Coping Ability:  Normal   Skill Deficits:  None   Supports:  Family     Religion: Religion/Spirituality Are You A Religious Person?: No  Leisure/Recreation: Leisure / Recreation Do You Have Hobbies?: No  Exercise/Diet: Exercise/Diet Do You Exercise?: No  Have You Gained or Lost A Significant Amount of Weight in the Past Six Months?: No Do You Follow a Special Diet?: No Do You Have Any Trouble Sleeping?: No   CCA Employment/Education Employment/Work Situation:    Education:     CCA Family/Childhood History Family and Relationship History: Family history Marital status: Single  Childhood History:  Childhood History Does patient have siblings?: No Did patient suffer any verbal/emotional/physical/sexual abuse as a child?: No Did patient suffer from severe childhood neglect?: No Has patient ever been sexually abused/assaulted/raped as an adolescent or adult?: No Was the patient ever a victim of a crime or a disaster?: No Witnessed domestic violence?: No Has patient been affected by domestic violence as an adult?: No  Child/Adolescent Assessment:      CCA Substance Use Alcohol/Drug Use: Alcohol / Drug Use Pain Medications: See MAR Prescriptions: See MAR Over the Counter: See MAR History of alcohol / drug use?: Yes Substance #1 Name of Substance 1: Alcohol 1 - Amount (size/oz): 4-5 40oz beers 1 - Frequency: daily 1 - Duration: 1 year 1 - Last Use / Amount: 05/30/20 Substance #2 Name of Substance 2: Cocaine 2 - Amount (size/oz): $20 worth 2 - Frequency: once a week 2 - Last Use / Amount: 05/23/20                     ASAM's:  Six Dimensions of Multidimensional Assessment  Dimension 1:  Acute Intoxication and/or Withdrawal Potential:      Dimension 2:  Biomedical Conditions and Complications:      Dimension 3:  Emotional, Behavioral, or Cognitive Conditions and Complications:     Dimension 4:  Readiness to Change:     Dimension 5:  Relapse, Continued use, or Continued Problem Potential:     Dimension 6:  Recovery/Living Environment:     ASAM Severity Score:    ASAM Recommended Level of Treatment:    Substance use Disorder (SUD) Substance Use Disorder (SUD)  Checklist Symptoms of Substance Use: Continued use despite having a persistent/recurrent physical/psychological problem caused/exacerbated by use,Continued use despite persistent or recurrent social, interpersonal problems, caused or exacerbated by use,Evidence of tolerance,Evidence of withdrawal (Comment),Large amounts of time spent to obtain, use or recover from the substance(s),Persistent desire or unsuccessful efforts to cut down or control use,Presence of craving or strong urge to use,Recurrent use that results in a failure to fulfill major role obligations (work, school, home),Social, occupational, recreational activities given up or reduced due to use  Recommendations for Services/Supports/Treatments:  Detox Treatment  DSM5 Diagnoses: Patient Active Problem List   Diagnosis Date Noted  . Alcohol abuse 10/20/2019  . Depression 10/20/2019  . A-fib  (HCC) 01/15/2017  . Personal history of tobacco use, presenting hazards to health 08/17/2016  . BPH associated with nocturia 07/30/2016  . IFG (impaired fasting glucose) 07/30/2016  . Essential hypertension   . Claudication (HCC)   . 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 CAD in native artery 04/11/2014  . Paroxysmal atrial fibrillation (HCC)   . Tobacco abuse   . Hyperlipidemia   . Atrial fibrillation with rapid ventricular response (HCC) 01/27/2014    Patient Centered Plan: Patient is on the following Treatment Plan(s):  Substance Abuse   Referrals to Alternative Service(s): Referred to Alternative Service(s):   Place:   Date:   Time:    Referred to Alternative Service(s):   Place:   Date:   Time:    Referred to Alternative Service(s):   Place:   Date:  Time:    Referred to Alternative Service(s):   Place:   Date:   Time:     Ryun Velez A Jahron Hunsinger, LCAS-A

## 2020-05-30 NOTE — ED Notes (Signed)
Took over care of pt. Psychiatrist at bedside speaking with pt. Pt in NAD at this time. VSS. Awaiting further orders. Will continue to monitor.

## 2020-05-30 NOTE — ED Notes (Signed)
Pt says that he went up to go outside to smoke a cigarette and when he got up he felt a little dizzy and that his heart rate had started to go up. Pt brought back for reassessment, hr 110 as consistent with previous.

## 2020-05-30 NOTE — ED Notes (Signed)
Pt resting at this time.

## 2020-05-31 DIAGNOSIS — F1010 Alcohol abuse, uncomplicated: Secondary | ICD-10-CM | POA: Diagnosis not present

## 2020-05-31 NOTE — ED Provider Notes (Signed)
-----------------------------------------   5:36 AM on 05/31/2020 -----------------------------------------   Blood pressure 128/71, pulse 68, temperature 98.1 F (36.7 C), resp. rate 15, SpO2 100 %.  The patient is calm and cooperative at this time.  There have been no acute events since the last update.  Awaiting disposition plan from TTS pending detox acceptance.   Irean Hong, MD 05/31/20 [phone]

## 2020-05-31 NOTE — ED Provider Notes (Signed)
-----------------------------------------   1:10 PM on 05/31/2020 -----------------------------------------  Dr. Toni Amend has evaluated the patient.  He advises that there are no detox beds available and after discussion with the patient, the patient is agreeable to discharge with outpatient follow-up.  I reassessed the patient and he is comfortable appearing with no signs of withdrawal.  His vital signs are stable.  Work-up is unremarkable.  He is stable for discharge at this time.  Return precautions given, and he expresses understanding.   Dionne Bucy, MD 05/31/20 1311

## 2020-05-31 NOTE — Consult Note (Signed)
[facility] Face-to-Face Psychiatry Consult   Reason for Consult: Follow-up consult 64 year old man with alcohol abuse who had been waiting for rehab placement Referring Physician: Siddiqui Patient Identification: Darren Rogers MRN:  [id number] Principal Diagnosis: Alcohol abuse Diagnosis:  Principal Problem:   Alcohol abuse   Total Time spent with patient: 30 minutes  Subjective:   Darren Rogers is a 64 y.o. male patient admitted with "I am feeling tired".  HPI: Patient seen chart reviewed.  Patient has been waiting in the emergency room after presenting with acute relapse and alcohol use and wanting to go to inpatient rehab.  He has been referred to all available centers that would be appropriate and at this point we have been told he has been turned down by all facilities.  There is currently no available option for rehab.  The patient is physically stable has not had seizures shows no signs of delirium.  On interview he is feeling tired from having spent a couple days in the hallway of the emergency room but denies suicidal thoughts and shows no sign of psychosis.  Past Psychiatric History: Past history of alcohol abuse with extended sobriety and experience in sobriety  Risk to Self:   Risk to Others:   Prior Inpatient Therapy:   Prior Outpatient Therapy:    Past Medical History:  Past Medical History:  Diagnosis Date  . Claudication (HCC)    a. 06/2015 ABI: R - 0.73, L - 0.73. 30-49% bilat SFA stenosis. 50-74% R Profunda stenosis; b. 01/2017 ABI: R 0.89, L 0.88, TBI R 0.65, L 1.0.  . Erectile dysfunction   . Essential hypertension   . History of echocardiogram    a. 03/29/2014: EF 55-60%, mild LVH, normal RVSP;  b. 05/2015 Echo: EF 60-65%, no rwma, triv AI, mild MR, mildly dil LA.  Marland Kitchen Hyperlipidemia   . Non-obstructive CAD    a. 01/27/2014 Cath: LM nl, LAD mild diff dz w/o obs, LCx no sig obs - scattered 20-30% mLCx, RCA no obs dz, EF 70%; b. 06/2015 MV; EF 60%, no nischemia.  Marland Kitchen PAF  (paroxysmal atrial fibrillation) (HCC)    a. Pt says Dx >63yrs ago w/ ? RFCA @ [facility];  b. Recurrent 12/2013;  b. Rx Flecainide and Xarelto-->Intermittent compliance.  Marland Kitchen PVC's (premature ventricular contractions)    a. 05/2017 Zio: 4000 PVCs in 48 hrs (2%). Brief run of SVT.  . Tobacco abuse    a. ongoing - 1 ppd.    Past Surgical History:  Procedure Laterality Date  . CARDIAC CATHETERIZATION    . CARDIAC CATHETERIZATION     [facility]  . LEFT HEART CATH Right 01/27/2014   Procedure: LEFT HEART CATH;  Surgeon: Micheline Chapman, MD;  [facility]: [facility] CATH LAB;  Service: Cardiovascular;  Laterality: Right;   Family History:  Family History  Problem Relation Age of Onset  . Coronary artery disease Father 41  . Diabetes Mother   . Diabetes Sister   . Diabetes Brother   . Diabetes Maternal Grandmother   . Diabetes Sister   . Diabetes Sister    Family Psychiatric  History: See previous Social History:  Social History   Substance and Sexual Activity  Alcohol Use Yes     Social History   Substance and Sexual Activity  Drug Use Yes  . Types: Cocaine    Social History   Socioeconomic History  . Marital status: Single    Spouse name: Not on file  . Number of children: Not on file  .  Years of education: Not on file  . Highest education level: Not on file  Occupational History  . Not on file  Tobacco Use  . Smoking status: Current Every Day Smoker    Packs/day: 0.50    Years: 30.00    Pack years: 15.00    Types: Cigarettes  . Smokeless tobacco: Never Used  Substance and Sexual Activity  . Alcohol use: Yes  . Drug use: Yes    Types: Cocaine  . Sexual activity: Not on file  Other Topics Concern  . Not on file  Social History Narrative   The patient lives in [place] with his sister. He works in a substance abuse program. 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 He is a long-time smoker, one pack per day. No illicit drugs. He is not married.    Social Determinants of Health   Financial Resource Strain: Not on file  Food Insecurity: Not on file  Transportation Needs: Not on file  Physical Activity: Not on file  Stress: Not on file  Social Connections: Not on file   Additional Social History:    Allergies:  No Known Allergies  Labs:  Results for orders placed or performed during the hospital encounter of 05/30/20 (from the past 48 hour(s))  Basic metabolic panel     Status: Abnormal   Collection Time: 05/29/20  8:45 PM  Result Value Ref Range   Sodium 138 135 - 145 mmol/L   Potassium 4.0 3.5 - 5.1 mmol/L   Chloride 99 98 - 111 mmol/L   CO2 25 22 - 32 mmol/L   Glucose, Bld 120 (H) 70 - 99 mg/dL    Comment: Glucose reference range applies only to samples taken after fasting for at least 8 hours.   BUN 11 8 - 23 mg/dL   Creatinine, Ser 9.82 0.61 - 1.24 mg/dL   Calcium 9.0 8.9 - 64.1 mg/dL   GFR, Estimated >58 >30 mL/min    Comment: (NOTE) Calculated using the CKD-EPI Creatinine Equation (2021)    Anion gap 14 5 - 15    Comment: Performed at [facility], [address] [place], [place] [address]  CBC     Status: None   Collection Time: 05/29/20  8:45 PM  Result Value Ref Range   WBC 6.7 4.0 - 10.5 K/uL   RBC 4.67 4.22 - 5.81 MIL/uL   Hemoglobin 15.4 13.0 - 17.0 g/dL   HCT 80.8 81.1 - 03.1 %   MCV 95.1 80.0 - 100.0 fL   MCH 33.0 26.0 - 34.0 pg   MCHC 34.7 30.0 - 36.0 g/dL   RDW 59.4 58.5 - 92.9 %   Platelets 287 150 - 400 K/uL   nRBC 0.0 0.0 - 0.2 %    Comment: Performed at [facility], [address] [place] [address] [place], [place] [address]  Troponin I (High Sensitivity)     Status: None   Collection Time: 05/29/20  8:45 PM  Result Value Ref Range   Troponin I (High Sensitivity) 12 <18 ng/L    Comment: (NOTE) Elevated high sensitivity troponin I (hsTnI) values and significant  changes across serial measurements may suggest ACS but many other  chronic and acute conditions are known to elevate  hsTnI results.  Refer to the "Links" section for chest pain algorithms and additional  guidance. Performed at [facility], [address] [place], [place] [address]   Troponin I (High Sensitivity)  Status: None   Collection Time: 05/29/20 11:51 PM  Result Value Ref Range   Troponin I (High Sensitivity) 15 <18 ng/L    Comment: (NOTE) Elevated high sensitivity troponin I (hsTnI) values and significant  changes across serial measurements may suggest ACS but many other  chronic and acute conditions are known to elevate hsTnI results.  Refer to the "Links" section for chest pain algorithms and additional  guidance. Performed at [facility], [address] [place], [place] [address]     No current facility-administered medications for this encounter.   Current Outpatient Medications  Medication Sig Dispense Refill  . amoxicillin-clavulanate (AUGMENTIN) 875-125 MG tablet Take 1 tablet by mouth 2 (two) times daily for 7 days. 14 tablet 0  . lidocaine (XYLOCAINE) 2 % solution Use as directed 15 mLs in the mouth or throat as needed for mouth pain. 150 mL 0  . amoxicillin (AMOXIL) 500 MG capsule Take 1 capsule (500 mg total) by mouth 3 (three) times daily. 30 capsule 0  . aspirin EC 81 MG tablet Take 1 tablet (81 mg total) by mouth daily. (Patient not taking: Reported on 10/20/2019) 30 tablet 1  . flecainide (TAMBOCOR) 100 MG tablet Take 1 tablet (100 mg total) by mouth every 12 (twelve) hours. (Patient not taking: Reported on 01/01/2020) 60 tablet 1  . flecainide (TAMBOCOR) 100 MG tablet Take 1 tablet (100 mg total) by mouth 2 (two) times daily for 14 days. 28 tablet 0  . lidocaine (XYLOCAINE) 2 % solution Use as directed 10 mLs in the mouth or throat as needed. 100 mL 0  . metoprolol tartrate (LOPRESSOR) 100 MG tablet Take 0.5 tablets (50 mg total) by mouth 2 (two) times daily. (Patient not taking: Reported on 01/01/2020) 30 tablet 1  . metoprolol tartrate (LOPRESSOR) 25  MG tablet Take 2 tablets (50 mg total) by mouth 2 (two) times daily for 14 days. 56 tablet 0  . rivaroxaban (XARELTO) 20 MG TABS tablet Take 1 tablet (20 mg total) by mouth daily with supper. (Patient not taking: Reported on 10/20/2019) 90 tablet 3  . rivaroxaban (XARELTO) 20 MG TABS tablet Take 1 tablet (20 mg total) by mouth daily with supper for 14 days. 14 tablet 0  . rosuvastatin (CRESTOR) 20 MG tablet Take 1 tablet (20 mg total) by mouth daily. (Patient not taking: Reported on 10/20/2019) 30 tablet 1  . rosuvastatin (CRESTOR) 20 MG tablet Take 1 tablet (20 mg total) by mouth daily for 14 days. 14 tablet 0    Musculoskeletal: Strength & Muscle Tone: within normal limits Gait & Station: normal Patient leans: N/A  Psychiatric Specialty Exam: Physical Exam Vitals and nursing note reviewed.  Constitutional:      Appearance: He is well-developed and well-nourished.  HENT:     Head: Normocephalic and atraumatic.  Eyes:     Conjunctiva/sclera: Conjunctivae normal.     Pupils: Pupils are equal, round, and reactive to light.  Cardiovascular:     Heart sounds: Normal heart sounds.  Pulmonary:     Effort: Pulmonary effort is normal.  Abdominal:     Palpations: Abdomen is soft.  Musculoskeletal:        General: Normal range of motion.     Cervical back: Normal range of motion.  Skin:    General: Skin is warm and dry.  Neurological:     General: No focal deficit present.     Mental Status: He is alert.  Psychiatric:  Attention and Perception: Attention normal.        Mood and Affect: Mood normal.        Speech: Speech normal.        Behavior: Behavior normal.        Thought Content: Thought content normal.        Cognition and Memory: Cognition normal.        Judgment: Judgment normal.     Review of Systems  Constitutional: Negative.   HENT: Negative.   Eyes: Negative.   Respiratory: Negative.   Cardiovascular: Negative.   Gastrointestinal: Negative.    Musculoskeletal: Negative.   Skin: Negative.   Neurological: Negative.   Psychiatric/Behavioral: Negative.     Blood pressure 135/68, pulse (!) 58, temperature 98.1 F (36.7 C), temperature source Oral, resp. rate 18, SpO2 98 %.There is no height or weight on file to calculate BMI.  General Appearance: Casual  Eye Contact:  Fair  Speech:  Clear and Coherent  Volume:  Normal  Mood:  Euthymic  Affect:  Constricted  Thought Process:  Goal Directed  Orientation:  Full (Time, Place, and Person)  Thought Content:  Logical  Suicidal Thoughts:  No  Homicidal Thoughts:  No  Memory:  Immediate;   Fair Recent;   Fair Remote;   Fair  Judgement:  Fair  Insight:  Fair  Psychomotor Activity:  Normal  Concentration:  Concentration: Fair  Recall:  [employer] of Knowledge:  Fair  Language:  Fair  Akathisia:  No  Handed:  Right  AIMS (if indicated):     Assets:  Desire for Improvement Housing Resilience  ADL's:  Intact  Cognition:  WNL  Sleep:        Treatment Plan Summary: Plan Patient is agreeable to plan and will be released from the emergency room.  He was given information about follow-up with [facility].  Supportive counseling and educational counseling completed.  No indication for any medicine.  Case reviewed with emergency room doctor.  Disposition: Patient does not meet criteria for psychiatric inpatient admission. Supportive therapy provided about ongoing stressors. Discussed crisis plan, support from social network, calling 911, coming to the Emergency Department, and calling Suicide Hotline.  Mordecai Rasmussen, MD 05/31/2020 11:46 AM

## 2020-05-31 NOTE — ED Notes (Signed)
Breakfast tray was brought to pt. Pt stated, "Im not hungry." This tech offered apple sauce and juice if he gets hungry later.

## 2020-05-31 NOTE — ED Notes (Signed)
Pt up to the bathroom with no difficulty ambulating.

## 2020-05-31 NOTE — ED Notes (Signed)
Hourly rounding reveals patient in room. No complaints, stable, in no acute distress. Q15 minute rounds and monitoring via Rover and Officer to continue.   

## 2020-05-31 NOTE — ED Notes (Signed)
Report to Amy, RN

## 2020-05-31 NOTE — ED Notes (Signed)
Pt declines breakfast.  

## 2020-05-31 NOTE — ED Notes (Signed)
Pt discharged home. VS stable. Discharge instructions reviewed with patient.  Pt given cab voucher.

## 2020-05-31 NOTE — Discharge Instructions (Addendum)
Follow-up at [facility] as recommended by the psychiatrist.  Return to the ER for any new or worsening symptoms that concern you.

## 2020-07-22 ENCOUNTER — Telehealth: Payer: Self-pay | Admitting: Cardiovascular Disease

## 2020-07-22 NOTE — Telephone Encounter (Signed)
Patient has been contacted at least 3 times for a recall, recall has been deleted  

## 2020-08-05 ENCOUNTER — Observation Stay
Admission: EM | Admit: 2020-08-05 | Discharge: 2020-08-06 | Disposition: A | Discharge status: Home / Self Care | Payer: Medicare Other | Attending: Obstetrics and Gynecology | Admitting: Obstetrics and Gynecology

## 2020-08-05 ENCOUNTER — Other Ambulatory Visit: Payer: Self-pay

## 2020-08-05 ENCOUNTER — Emergency Department: Payer: Medicare Other

## 2020-08-05 DIAGNOSIS — R0789 Other chest pain: Principal | ICD-10-CM

## 2020-08-05 DIAGNOSIS — F1910 Other psychoactive substance abuse, uncomplicated: Secondary | ICD-10-CM

## 2020-08-05 DIAGNOSIS — I10 Essential (primary) hypertension: Secondary | ICD-10-CM | POA: Diagnosis present

## 2020-08-05 DIAGNOSIS — I480 Paroxysmal atrial fibrillation: Secondary | ICD-10-CM

## 2020-08-05 DIAGNOSIS — Z20822 Contact with and (suspected) exposure to covid-19: Secondary | ICD-10-CM | POA: Insufficient documentation

## 2020-08-05 DIAGNOSIS — R079 Chest pain, unspecified: Secondary | ICD-10-CM

## 2020-08-05 DIAGNOSIS — I2489 Other forms of acute ischemic heart disease: Secondary | ICD-10-CM

## 2020-08-05 DIAGNOSIS — I2510 Atherosclerotic heart disease of native coronary artery without angina pectoris: Secondary | ICD-10-CM | POA: Diagnosis present

## 2020-08-05 DIAGNOSIS — F17210 Nicotine dependence, cigarettes, uncomplicated: Secondary | ICD-10-CM | POA: Diagnosis not present

## 2020-08-05 DIAGNOSIS — Z7982 Long term (current) use of aspirin: Secondary | ICD-10-CM | POA: Insufficient documentation

## 2020-08-05 DIAGNOSIS — Z9114 Patient's other noncompliance with medication regimen: Secondary | ICD-10-CM | POA: Insufficient documentation

## 2020-08-05 DIAGNOSIS — I4891 Unspecified atrial fibrillation: Secondary | ICD-10-CM | POA: Diagnosis not present

## 2020-08-05 DIAGNOSIS — I739 Peripheral vascular disease, unspecified: Secondary | ICD-10-CM

## 2020-08-05 DIAGNOSIS — Z7901 Long term (current) use of anticoagulants: Secondary | ICD-10-CM | POA: Diagnosis not present

## 2020-08-05 DIAGNOSIS — Z79899 Other long term (current) drug therapy: Secondary | ICD-10-CM | POA: Insufficient documentation

## 2020-08-05 DIAGNOSIS — I248 Other forms of acute ischemic heart disease: Secondary | ICD-10-CM

## 2020-08-05 DIAGNOSIS — F1410 Cocaine abuse, uncomplicated: Secondary | ICD-10-CM

## 2020-08-05 DIAGNOSIS — F1020 Alcohol dependence, uncomplicated: Secondary | ICD-10-CM

## 2020-08-05 LAB — URINE DRUG SCREEN, QUALITATIVE (ARMC ONLY)
Amphetamines, Ur Screen: NOT DETECTED
Barbiturates, Ur Screen: NOT DETECTED
Benzodiazepine, Ur Scrn: NOT DETECTED
Cannabinoid 50 Ng, Ur ~~LOC~~: NOT DETECTED
Cocaine Metabolite,Ur ~~LOC~~: NOT DETECTED
MDMA (Ecstasy)Ur Screen: NOT DETECTED
Methadone Scn, Ur: NOT DETECTED
Opiate, Ur Screen: NOT DETECTED
Phencyclidine (PCP) Ur S: NOT DETECTED
Tricyclic, Ur Screen: NOT DETECTED

## 2020-08-05 LAB — APTT: aPTT: 33 seconds (ref 24–36)

## 2020-08-05 LAB — BASIC METABOLIC PANEL
Anion gap: 11 (ref 5–15)
BUN: 8 mg/dL (ref 8–23)
CO2: 20 mmol/L — ABNORMAL LOW (ref 22–32)
Calcium: 8.9 mg/dL (ref 8.9–10.3)
Chloride: 110 mmol/L (ref 98–111)
Creatinine, Ser: 0.85 mg/dL (ref 0.61–1.24)
GFR, Estimated: >60 mL/min (ref >60)
Glucose, Bld: 96 mg/dL (ref 70–99)
Potassium: 3.2 mmol/L — ABNORMAL LOW (ref 3.5–5.1)
Sodium: 141 mmol/L (ref 135–145)

## 2020-08-05 LAB — CBC WITH DIFFERENTIAL/PLATELET
Abs Immature Granulocytes: 0.02 10*3/uL (ref 0.00–0.07)
Basophils Absolute: 0.1 10*3/uL (ref 0.0–0.1)
Basophils Relative: 1 %
Eosinophils Absolute: 0.1 10*3/uL (ref 0.0–0.5)
Eosinophils Relative: 1 %
HCT: 37.8 % — ABNORMAL LOW (ref 39.0–52.0)
Hemoglobin: 13 g/dL (ref 13.0–17.0)
Immature Granulocytes: 0 %
Lymphocytes Relative: 47 %
Lymphs Abs: 2.6 10*3/uL (ref 0.7–4.0)
MCH: 31.9 pg (ref 26.0–34.0)
MCHC: 34.4 g/dL (ref 30.0–36.0)
MCV: 92.9 fL (ref 80.0–100.0)
Monocytes Absolute: 0.5 10*3/uL (ref 0.1–1.0)
Monocytes Relative: 8 %
Neutro Abs: 2.5 10*3/uL (ref 1.7–7.7)
Neutrophils Relative %: 43 %
Platelets: 281 10*3/uL (ref 150–400)
RBC: 4.07 MIL/uL — ABNORMAL LOW (ref 4.22–5.81)
RDW: 14.3 % (ref 11.5–15.5)
WBC: 5.7 10*3/uL (ref 4.0–10.5)
nRBC: 0 % (ref 0.0–0.2)

## 2020-08-05 LAB — RESP PANEL BY RT-PCR (FLU A&B, COVID) ARPGX2
Influenza A by PCR: NEGATIVE
Influenza B by PCR: NEGATIVE
SARS Coronavirus 2 by RT PCR: NEGATIVE

## 2020-08-05 LAB — TROPONIN I (HIGH SENSITIVITY)
Troponin I (High Sensitivity): 16 ng/L (ref <18)
Troponin I (High Sensitivity): 50 ng/L — ABNORMAL HIGH (ref <18)
Troponin I (High Sensitivity): 77 ng/L — ABNORMAL HIGH (ref <18)
Troponin I (High Sensitivity): 104 ng/L (ref <18)

## 2020-08-05 LAB — HEPARIN LEVEL (UNFRACTIONATED)
Heparin Unfractionated: 0.31 IU/mL (ref 0.30–0.70)
Heparin Unfractionated: 0.65 IU/mL (ref 0.30–0.70)

## 2020-08-05 LAB — HEPATITIS C ANTIBODY: HCV Ab: NONREACTIVE

## 2020-08-05 LAB — BRAIN NATRIURETIC PEPTIDE: B Natriuretic Peptide: 93.2 pg/mL (ref 0.0–100.0)

## 2020-08-05 LAB — TSH: TSH: 0.952 u[IU]/mL (ref 0.350–4.500)

## 2020-08-05 LAB — HIV ANTIBODY (ROUTINE TESTING W REFLEX): HIV Screen 4th Generation wRfx: NONREACTIVE

## 2020-08-05 LAB — PHOSPHORUS: Phosphorus: 3.4 mg/dL (ref 2.5–4.6)

## 2020-08-05 LAB — MAGNESIUM: Magnesium: 1.9 mg/dL (ref 1.7–2.4)

## 2020-08-05 MED ORDER — LORAZEPAM 2 MG PO TABS: 0.0000 mg | ORAL_TABLET | Freq: Four times a day (QID) | ORAL | Status: DC

## 2020-08-05 MED ORDER — METOPROLOL TARTRATE 25 MG PO TABS
12.5000 mg | ORAL_TABLET | Freq: Four times a day (QID) | ORAL | Status: DC
  Administered 2020-08-05 – 2020-08-06 (×3): 12.5 mg via ORAL
  Filled 2020-08-05 (×5): qty 1

## 2020-08-05 MED ORDER — ADULT MULTIVITAMIN W/MINERALS CH
1.0000 | ORAL_TABLET | Freq: Every day | ORAL | Status: DC
  Administered 2020-08-05 – 2020-08-06 (×2): 1 via ORAL
  Filled 2020-08-05 (×2): qty 1

## 2020-08-05 MED ORDER — LORAZEPAM 2 MG/ML IJ SOLN: 0.0000 mg | Freq: Two times a day (BID) | INTRAMUSCULAR | Status: DC

## 2020-08-05 MED ORDER — POTASSIUM CHLORIDE CRYS ER 20 MEQ PO TBCR
40.0000 meq | EXTENDED_RELEASE_TABLET | Freq: Once | ORAL | Status: AC
  Administered 2020-08-05: 40 meq via ORAL
  Filled 2020-08-05: qty 2

## 2020-08-05 MED ORDER — LORAZEPAM 2 MG/ML IJ SOLN: 0.0000 mg | Freq: Four times a day (QID) | INTRAMUSCULAR | Status: DC

## 2020-08-05 MED ORDER — ONDANSETRON HCL 4 MG/2ML IJ SOLN: 4.0000 mg | Freq: Four times a day (QID) | INTRAMUSCULAR | Status: DC | PRN

## 2020-08-05 MED ORDER — DILTIAZEM HCL-DEXTROSE 125-5 MG/125ML-% IV SOLN (PREMIX)
5.0000 mg/h | INTRAVENOUS | Status: DC
  Administered 2020-08-05: 10 mg/h via INTRAVENOUS

## 2020-08-05 MED ORDER — ENOXAPARIN SODIUM 40 MG/0.4ML ~~LOC~~ SOLN
40.0000 mg | SUBCUTANEOUS | Status: DC
  Administered 2020-08-05: 40 mg via SUBCUTANEOUS
  Filled 2020-08-05: qty 0.4

## 2020-08-05 MED ORDER — THIAMINE HCL 100 MG PO TABS: 100.0000 mg | ORAL_TABLET | Freq: Every day | ORAL | Status: DC

## 2020-08-05 MED ORDER — HEPARIN (PORCINE) 25000 UT/250ML-% IV SOLN
1100.0000 [IU]/h | INTRAVENOUS | Status: DC
  Administered 2020-08-05 – 2020-08-06 (×2): 1100 [IU]/h via INTRAVENOUS
  Filled 2020-08-05 (×2): qty 250

## 2020-08-05 MED ORDER — DILTIAZEM HCL-DEXTROSE 125-5 MG/125ML-% IV SOLN (PREMIX)
5.0000 mg/h | INTRAVENOUS | Status: DC
  Administered 2020-08-05: 5 mg/h via INTRAVENOUS

## 2020-08-05 MED ORDER — NICOTINE 14 MG/24HR TD PT24: 14.0000 mg | MEDICATED_PATCH | Freq: Every day | TRANSDERMAL | Status: DC

## 2020-08-05 MED ORDER — LORAZEPAM 2 MG PO TABS: 0.0000 mg | ORAL_TABLET | Freq: Two times a day (BID) | ORAL | Status: DC

## 2020-08-05 MED ORDER — LORAZEPAM 1 MG PO TABS: 1.0000 mg | ORAL_TABLET | ORAL | Status: DC | PRN

## 2020-08-05 MED ORDER — DILTIAZEM HCL 30 MG PO TABS
90.0000 mg | ORAL_TABLET | Freq: Four times a day (QID) | ORAL | Status: DC
  Administered 2020-08-05 – 2020-08-06 (×3): 90 mg via ORAL
  Filled 2020-08-05 (×3): qty 3

## 2020-08-05 MED ORDER — THIAMINE HCL 100 MG PO TABS
100.0000 mg | ORAL_TABLET | Freq: Every day | ORAL | Status: DC
  Administered 2020-08-05 – 2020-08-06 (×2): 100 mg via ORAL
  Filled 2020-08-05 (×2): qty 1

## 2020-08-05 MED ORDER — FUROSEMIDE 10 MG/ML IJ SOLN
20.0000 mg | Freq: Once | INTRAMUSCULAR | Status: AC
  Administered 2020-08-05: 20 mg via INTRAVENOUS
  Filled 2020-08-05: qty 4

## 2020-08-05 MED ORDER — DILTIAZEM LOAD VIA INFUSION
10.0000 mg | Freq: Once | INTRAVENOUS | Status: AC
  Administered 2020-08-05: 10 mg via INTRAVENOUS

## 2020-08-05 MED ORDER — ACETAMINOPHEN 325 MG PO TABS: 650.0000 mg | ORAL_TABLET | ORAL | Status: DC | PRN

## 2020-08-05 MED ORDER — DILTIAZEM HCL-DEXTROSE 125-5 MG/125ML-% IV SOLN (PREMIX)
5.0000 mg/h | INTRAVENOUS | Status: AC
  Administered 2020-08-05: 15 mg/h via INTRAVENOUS
  Administered 2020-08-05: 10 mg/h via INTRAVENOUS
  Filled 2020-08-05 (×2): qty 125

## 2020-08-05 MED ORDER — FOLIC ACID 1 MG PO TABS
1.0000 mg | ORAL_TABLET | Freq: Every day | ORAL | Status: DC
  Administered 2020-08-05 – 2020-08-06 (×2): 1 mg via ORAL
  Filled 2020-08-05 (×2): qty 1

## 2020-08-05 MED ORDER — ASPIRIN EC 81 MG PO TBEC
81.0000 mg | DELAYED_RELEASE_TABLET | Freq: Every day | ORAL | Status: DC
  Administered 2020-08-06: 81 mg via ORAL
  Filled 2020-08-05: qty 1

## 2020-08-05 MED ORDER — THIAMINE HCL 100 MG/ML IJ SOLN: 100.0000 mg | Freq: Every day | INTRAMUSCULAR | Status: DC

## 2020-08-05 MED ORDER — ASPIRIN 81 MG PO CHEW
324.0000 mg | CHEWABLE_TABLET | Freq: Once | ORAL | Status: DC
  Filled 2020-08-05: qty 4

## 2020-08-05 MED ORDER — LORAZEPAM 2 MG/ML IJ SOLN
1.0000 mg | INTRAMUSCULAR | Status: DC | PRN
Start: 2020-08-05 — End: 2020-08-06

## 2020-08-05 MED ORDER — ROSUVASTATIN CALCIUM 10 MG PO TABS
40.0000 mg | ORAL_TABLET | Freq: Every day | ORAL | Status: DC
  Administered 2020-08-06: 40 mg via ORAL
  Filled 2020-08-05: qty 4

## 2020-08-05 MED ORDER — NITROGLYCERIN 0.4 MG SL SUBL
0.4000 mg | SUBLINGUAL_TABLET | SUBLINGUAL | Status: DC | PRN
  Administered 2020-08-05: 0.4 mg via SUBLINGUAL
  Filled 2020-08-05: qty 1

## 2020-08-05 NOTE — ED Notes (Signed)
Pt denies cp at this time, alert and oriented, clear speech

## 2020-08-05 NOTE — ED Notes (Addendum)
Dr Ashok Pall notified of troponin increased to 104. No new orders

## 2020-08-05 NOTE — Progress Notes (Signed)
PROGRESS NOTE    Darren Rogers  XAJ:[id number] DOB: [birth year] DOA: 08/05/2020 PCP: Patient, No Pcp Per  Outpatient Specialists: [facility] dr. Kirke Corin    Brief Narrative:   Darren Rogers is a 65 y.o. male with medical history significant for atrial fibrillation on metoprolol and flecainide, who stopped Xarelto on his own due to nosebleeds, who also has history of CAD, PAD, HTN, alcohol use disorder and nicotine use and who admits to overall inconsistent compliance with meds who presents to the emergency room with sudden onset retrosternal chest pain/pressure that started as he was preparing to go to bed.  The pain was nonradiating.  Associated with palpitations, shortness of breath and lightheadedness.  Had no associated nausea, vomiting or diaphoresis.  Denies cough, fever or chills.  Patient states he had been at his sister's house helping her following a fall for the past 4 days and did not have his medication with him.  EMS reported heart rate in the 200s for which she was administered 5 mg IV metoprolol and 4 baby aspirin   Assessment & Plan:   Principal Problem:   Chest pain Active Problems:   Atrial fibrillation with rapid ventricular response (HCC)   CAD in native artery   PAD (peripheral artery disease) (HCC)   Essential hypertension   Rapid atrial fibrillation (HCC)   Alcohol use disorder, moderate, dependence (HCC)  # Chest pain # CAD Reports crushing chest pain, non-exertional, and palpitations. Chest pain resolved, palpitations persist. In a-fib with RVR. Trop has bumped from normal to 50. CXR relatively clear, no hypoxia. bnp wnl. Received asa. Mild ascvd on 2015 cath, has mult risk factors. - cont aspirin and statin, treatment of a fib as below - continue to trend troponins - maintain telemetry - cardiology consulted - would start heparin if trop continues to rise  # A fib with rvr 2/2 alcohol use, medication non-compliance. Has declined anticoagulation in the  past. HR improved to 110s on dilt gtt - continue dilt gtt - keep k above 4, mg above 2 - f/u cardiology recs when available  # Hypokalemia 3.2, received 40 of k. Mg wnl - repeat k 40 - monitor  # PAD - cont asa, statin  # HTN Here bp wnl on dilt gtt - monitor  # Alcohol use disorder History of withdrawal symptoms, denies hx complicated withdrawal - ciwa lorazepam - the root problem here, needs to be addressed  # Cocaine use disorder Certainly contributes - f/u UDS, hiv, hcv  # Nicotine dependence Declines patch       DVT prophylaxis: lovenox Code Status: full Family Communication: none @ bedside  Level of care: Progressive Cardiac Status is: Inpatient  Remains inpatient appropriate because:Inpatient level of care appropriate due to severity of illness   Dispo: The patient is from: Home              Anticipated d/c is to: Home              Patient currently is not medically stable to d/c.   Difficult to place patient No        Consultants:  cardiology  Procedures: none  Antimicrobials:  none    Subjective: This morning chest pain resolved. Palpitations persist, mild. No dyspnea. No abd pain, non n/v/d.  Objective: Vitals:   08/05/20 0500 08/05/20 0530 08/05/20 0539 08/05/20 0545  BP: 138/80 139/90 139/90   Pulse: (!) 117 (!) 124 (!) 110 65  Resp: (!) 23 19  20  Temp:      TempSrc:      SpO2: 96% 98%  97%  Weight:      Height:        Intake/Output Summary (Last 24 hours) at 08/05/2020 0739 Last data filed at 08/05/2020 0534 Gross per 24 hour  Intake 370.06 ml  Output -  Net 370.06 ml   Filed Weights   08/05/20 0359  Weight: 70.3 kg    Examination:  General exam: Appears calm and comfortable  Respiratory system: Clear to auscultation. Respiratory effort normal. Cardiovascular system: S1 & S2 heard, RRR. No JVD, murmurs, rubs, gallops or clicks. No pedal edema. Gastrointestinal system: Abdomen is nondistended, soft and  nontender. No organomegaly or masses felt. Normal bowel sounds heard. Central nervous system: Alert and oriented. No focal neurological deficits. Extremities: Symmetric 5 x 5 power. Skin: No rashes, lesions or ulcers Psychiatry: Judgement and insight appear normal. Mood & affect appropriate.     Data Reviewed: I have personally reviewed following labs and imaging studies  CBC: Recent Labs  Lab 08/05/20 0420  WBC 5.7  NEUTROABS 2.5  HGB 13.0  HCT 37.8*  MCV 92.9  PLT 281   Basic Metabolic Panel: Recent Labs  Lab 08/05/20 0420 08/05/20 0621  NA 141  --   K 3.2*  --   CL 110  --   CO2 20*  --   GLUCOSE 96  --   BUN 8  --   CREATININE 0.85  --   CALCIUM 8.9  --   MG  --  1.9  PHOS  --  3.4   GFR: Estimated Creatinine Clearance: 78.2 mL/min (by C-G formula based on SCr of 0.85 mg/dL). Liver Function Tests: No results for input(s): AST, ALT, ALKPHOS, BILITOT, PROT, ALBUMIN in the last 168 hours. No results for input(s): LIPASE, AMYLASE in the last 168 hours. No results for input(s): AMMONIA in the last 168 hours. Coagulation Profile: No results for input(s): INR, PROTIME in the last 168 hours. Cardiac Enzymes: No results for input(s): CKTOTAL, CKMB, CKMBINDEX, TROPONINI in the last 168 hours. BNP (last 3 results) No results for input(s): PROBNP in the last 8760 hours. HbA1C: No results for input(s): HGBA1C in the last 72 hours. CBG: No results for input(s): GLUCAP in the last 168 hours. Lipid Profile: No results for input(s): CHOL, HDL, LDLCALC, TRIG, CHOLHDL, LDLDIRECT in the last 72 hours. Thyroid Function Tests: No results for input(s): TSH, T4TOTAL, FREET4, T3FREE, THYROIDAB in the last 72 hours. Anemia Panel: No results for input(s): VITAMINB12, FOLATE, FERRITIN, TIBC, IRON, RETICCTPCT in the last 72 hours. Urine analysis:    Component Value Date/Time   COLORURINE YELLOW (A) 11/16/2014 1255   APPEARANCEUR Clear 07/30/2016 1600   LABSPEC 1.021 11/16/2014  1255   LABSPEC 1.019 03/28/2014 1430   PHURINE 5.0 11/16/2014 1255   GLUCOSEU Negative 07/30/2016 1600   GLUCOSEU Negative 03/28/2014 1430   HGBUR NEGATIVE 11/16/2014 1255   BILIRUBINUR Negative 07/30/2016 1600   BILIRUBINUR Negative 03/28/2014 1430   KETONESUR NEGATIVE 11/16/2014 1255   PROTEINUR Negative 07/30/2016 1600   PROTEINUR NEGATIVE 11/16/2014 1255   NITRITE Negative 07/30/2016 1600   NITRITE NEGATIVE 11/16/2014 1255   LEUKOCYTESUR Negative 07/30/2016 1600   LEUKOCYTESUR Negative 03/28/2014 1430   Sepsis Labs: @LABRCNTIP (procalcitonin:4,lacticidven:4)  ) Recent Results (from the past 240 hour(s))  Resp Panel by RT-PCR (Flu A&B, Covid) Nasopharyngeal Swab     Status: None   Collection Time: 08/05/20  5:35 AM   Specimen: Nasopharyngeal Swab;  Nasopharyngeal(NP) swabs in vial transport medium  Result Value Ref Range Status   SARS Coronavirus 2 by RT PCR NEGATIVE NEGATIVE Final    Comment: (NOTE) SARS-CoV-2 target nucleic acids are NOT DETECTED.  The SARS-CoV-2 RNA is generally detectable in upper respiratory specimens during the acute phase of infection. The lowest concentration of SARS-CoV-2 viral copies this assay can detect is 138 copies/mL. A negative result does not preclude SARS-Cov-2 infection and should not be used as the sole basis for treatment or other patient management decisions. A negative result may occur with  improper specimen collection/handling, submission of specimen other than nasopharyngeal swab, presence of viral mutation(s) within the areas targeted by this assay, and inadequate number of viral copies(<138 copies/mL). A negative result must be combined with clinical observations, patient history, and epidemiological information. The expected result is Negative.  Fact Sheet for Patients:  BloggerCourse.com  Fact Sheet for Healthcare Providers:  SeriousBroker.it  This test is no t yet approved  or cleared by the [place] FDA and  has been authorized for detection and/or diagnosis of SARS-CoV-2 by FDA under an Emergency Use Authorization (EUA). This EUA will remain  in effect (meaning this test can be used) for the duration of the COVID-19 declaration under Section 564(b)(1) of the Act, 21 U.S.C.section 360bbb-3(b)(1), unless the authorization is terminated  or revoked sooner.       Influenza A by PCR NEGATIVE NEGATIVE Final   Influenza B by PCR NEGATIVE NEGATIVE Final    Comment: (NOTE) The Xpert Xpress SARS-CoV-2/FLU/RSV plus assay is intended as an aid in the diagnosis of influenza from Nasopharyngeal swab specimens and should not be used as a sole basis for treatment. Nasal washings and aspirates are unacceptable for Xpert Xpress SARS-CoV-2/FLU/RSV testing.  Fact Sheet for Patients: BloggerCourse.com  Fact Sheet for Healthcare Providers: SeriousBroker.it  This test is not yet approved or cleared by the [place] FDA and has been authorized for detection and/or diagnosis of SARS-CoV-2 by FDA under an Emergency Use Authorization (EUA). This EUA will remain in effect (meaning this test can be used) for the duration of the COVID-19 declaration under Section 564(b)(1) of the Act, 21 U.S.C. section 360bbb-3(b)(1), unless the authorization is terminated or revoked.  Performed at [facility], [address] [place], [place] [address]          Radiology Studies: DG Chest Portable 1 View  Result Date: 08/05/2020 CLINICAL DATA:  Chest pain and shortness of breath EXAM: PORTABLE CHEST 1 VIEW COMPARISON:  05/29/2020 FINDINGS: Interstitial prominence when compared to before but no Kerley lines or effusion. Normal heart size for technique with stable aortic and hilar contours. No visible effusion or pneumothorax. IMPRESSION: Borderline vascular congestion. Electronically Signed   By: Marnee Spring M.D.    On: 08/05/2020 04:33        Scheduled Meds: . enoxaparin (LOVENOX) injection  40 mg Subcutaneous Q24H  . folic acid  1 mg Oral Daily  . LORazepam  0-4 mg Intravenous Q6H   Or  . LORazepam  0-4 mg Oral Q6H  . [START ON 08/07/2020] LORazepam  0-4 mg Intravenous Q12H   Or  . [START ON 08/07/2020] LORazepam  0-4 mg Oral Q12H  . multivitamin with minerals  1 tablet Oral Daily  . nicotine  14 mg Transdermal Daily  . thiamine  100 mg Oral Daily   Or  . thiamine  100 mg Intravenous Daily   Continuous Infusions: . diltiazem (CARDIZEM) infusion 15 mg/hr (08/05/20 0534)  LOS: 0 days    Time spent: 45 min    Silvano Bilis, MD [facility]   If 7PM-7AM, please contact night-coverage [email or web address] Password [facility] 08/05/2020, 7:39 AM

## 2020-08-05 NOTE — ED Notes (Signed)
Dr Ashok Pall notified of increase in troponin to 77, no new orders at this time

## 2020-08-05 NOTE — ED Notes (Signed)
Pt repositioned in bed, lights dimmed for comfort, given remote for tv

## 2020-08-05 NOTE — ED Notes (Signed)
Pt given sprite at this time per request.  Pt reports relief in chest pain at this time. VSS, NAD noted.

## 2020-08-05 NOTE — ED Provider Notes (Addendum)
[facility] Emergency Department Provider Note  ____________________________________________   Event Date/Time   First MD Initiated Contact with Patient 08/05/20 0400     (approximate)  I have reviewed the triage vital signs and the nursing notes.   HISTORY  Chief Complaint Chest Pain    HPI Marcial Thai is a 65 y.o. male with history of hypertension, hyperlipidemia, nonobstructive CAD, PAD, paroxysmal atrial fibrillation on metoprolol and flecainide but no longer on Xarelto who presents to the emergency department with EMS with concerns for chest pain.  States he was about to go to bed and was eating a pancake around 2:15 AM and started having diffuse anterior chest tightness, pressure and felt his heart racing.  Had shortness of breath, lightheadedness.  No nausea, vomiting or diaphoresis.  States he has been staying with his sister and taking care of her after a fall for the past 4 days and has not had his metoprolol or flecainide.  EMS reports patient initial heart rate was in the 200s in atrial fibrillation.  He received 5 mg of IV metoprolol and 4 baby aspirin.  Still complains of 10/10 chest tightness at this time.  EMS reports patient was placed on 3 L nasal cannula by first responders for comfort.  Does not wear oxygen chronically.  History obtained from patient and EMS. Cardiologist is Dr. Kirke Corin.  States he took himself off of his Xarelto because he was having nosebleeds.  He did not follow-up with his cardiologist regarding this.  He does also have a documented history of alcohol abuse and states he has been drinking approximately a 40 ounce of beer every day.  He states that sometimes he will have some tremors in the morning but no history of withdrawal seizures, DTs.  Last drank yesterday.     Echo 2016:  Study Conclusions   - Left ventricle: The cavity size was normal. There was mild  concentric hypertrophy. Systolic function was normal. The   estimated ejection fraction was in the range of 60% to 65%. Wall  motion was normal; there were no regional wall motion  abnormalities. Left ventricular diastolic function parameters  were normal.  - Aortic valve: There was trivial regurgitation.  - Mitral valve: There was mild regurgitation.  - Left atrium: The atrium was mildly dilated.      Past Medical History:  Diagnosis Date  . Claudication (HCC)    a. 06/2015 ABI: R - 0.73, L - 0.73. 30-49% bilat SFA stenosis. 50-74% R Profunda stenosis; b. 01/2017 ABI: R 0.89, L 0.88, TBI R 0.65, L 1.0.  . Erectile dysfunction   . Essential hypertension   . History of echocardiogram    a. 03/29/2014: EF 55-60%, mild LVH, normal RVSP;  b. 05/2015 Echo: EF 60-65%, no rwma, triv AI, mild MR, mildly dil LA.  Marland Kitchen Hyperlipidemia   . Non-obstructive CAD    a. 01/27/2014 Cath: LM nl, LAD mild diff dz w/o obs, LCx no sig obs - scattered 20-30% mLCx, RCA no obs dz, EF 70%; b. 06/2015 MV; EF 60%, no nischemia.  Marland Kitchen PAF (paroxysmal atrial fibrillation) (HCC)    a. Pt says Dx >3yrs ago w/ ? RFCA @ [facility];  b. Recurrent 12/2013;  b. Rx Flecainide and Xarelto-->Intermittent compliance.  Marland Kitchen PVC's (premature ventricular contractions)    a. 05/2017 Zio: 4000 PVCs in 48 hrs (2%). Brief run of SVT.  . Tobacco abuse    a. ongoing - 1 ppd.    Patient Active Problem  List   Diagnosis Date Noted  . Alcohol abuse 10/20/2019  . Depression 10/20/2019  . A-fib (HCC) 01/15/2017  . Personal history of tobacco use, presenting hazards to health 08/17/2016  . BPH associated with nocturia 07/30/2016  . IFG (impaired fasting glucose) 07/30/2016  . Essential hypertension   . Claudication (HCC)   . 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 CAD in native artery 04/11/2014  . Paroxysmal atrial fibrillation (HCC)   . Tobacco abuse   . Hyperlipidemia   . Atrial fibrillation with rapid ventricular response (HCC) 01/27/2014    Past Surgical History:  Procedure  Laterality Date  . CARDIAC CATHETERIZATION    . CARDIAC CATHETERIZATION     [facility]  . LEFT HEART CATH Right 01/27/2014   Procedure: LEFT HEART CATH;  Surgeon: Micheline Chapman, MD;  Location: [facility] CATH LAB;  Service: Cardiovascular;  Laterality: Right;    Prior to Admission medications   Medication Sig Start Date End Date Taking? Authorizing Provider  amoxicillin (AMOXIL) 500 MG capsule Take 1 capsule (500 mg total) by mouth 3 (three) times daily. 12/07/19   Enid Derry, PA-C  aspirin EC 81 MG tablet Take 1 tablet (81 mg total) by mouth daily. Patient not taking: Reported on 10/20/2019 08/16/19   Emily Filbert, MD  flecainide (TAMBOCOR) 100 MG tablet Take 1 tablet (100 mg total) by mouth every 12 (twelve) hours. Patient not taking: Reported on 01/01/2020 08/16/19   Emily Filbert, MD  flecainide (TAMBOCOR) 100 MG tablet Take 1 tablet (100 mg total) by mouth 2 (two) times daily for 14 days. 01/29/20 02/12/20  Delton Prairie, MD  lidocaine (XYLOCAINE) 2 % solution Use as directed 10 mLs in the mouth or throat as needed. 12/07/19   Enid Derry, PA-C  lidocaine (XYLOCAINE) 2 % solution Use as directed 15 mLs in the mouth or throat as needed for mouth pain. 05/30/20   Nita Sickle, MD  metoprolol tartrate (LOPRESSOR) 100 MG tablet Take 0.5 tablets (50 mg total) by mouth 2 (two) times daily. Patient not taking: Reported on 01/01/2020 08/16/19   Emily Filbert, MD  metoprolol tartrate (LOPRESSOR) 25 MG tablet Take 2 tablets (50 mg total) by mouth 2 (two) times daily for 14 days. 01/29/20 02/12/20  Delton Prairie, MD  rivaroxaban (XARELTO) 20 MG TABS tablet Take 1 tablet (20 mg total) by mouth daily with supper. Patient not taking: Reported on 10/20/2019 08/16/19   Emily Filbert, MD  rivaroxaban (XARELTO) 20 MG TABS tablet Take 1 tablet (20 mg total) by mouth daily with supper for 14 days. 01/29/20 02/12/20  Delton Prairie, MD  rosuvastatin (CRESTOR) 20 MG tablet Take 1 tablet (20 mg total) by  mouth daily. Patient not taking: Reported on 10/20/2019 08/16/19   Emily Filbert, MD  rosuvastatin (CRESTOR) 20 MG tablet Take 1 tablet (20 mg total) by mouth daily for 14 days. 01/29/20 02/12/20  Delton Prairie, MD    Allergies Patient has no known allergies.  Family History  Problem Relation Age of Onset  . Coronary artery disease Father 32  . Diabetes Mother   . Diabetes Sister   . Diabetes Brother   . Diabetes Maternal Grandmother   . Diabetes Sister   . Diabetes Sister     Social History Social History   Tobacco Use  . Smoking status: Current Every Day Smoker    Packs/day: 0.50    Years: 30.00    Pack years: 15.00    Types: Cigarettes  . Smokeless  tobacco: Never Used  Substance Use Topics  . Alcohol use: Yes  . Drug use: Yes    Types: Cocaine    Review of Systems Constitutional: No fever. Eyes: No visual changes. ENT: No sore throat. Cardiovascular:+ chest pain. Respiratory: + shortness of breath. Gastrointestinal: No nausea, vomiting, diarrhea. Genitourinary: Negative for dysuria. Musculoskeletal: Negative for back pain. Skin: Negative for rash. Neurological: Negative for focal weakness or numbness.  ____________________________________________   PHYSICAL EXAM:  VITAL SIGNS: ED Triage Vitals  Enc Vitals Group     BP 08/05/20 0356 (!) 178/90     Pulse Rate 08/05/20 0356 (!) 159     Resp 08/05/20 0356 (!) 22     Temp 08/05/20 0356 98.4 F (36.9 C)     Temp Source 08/05/20 0356 Oral     SpO2 08/05/20 0356 100 %     Weight 08/05/20 0359 155 lb (70.3 kg)     Height 08/05/20 0359 5\' 6"  (1.676 m)     Head Circumference --      Peak Flow --      Pain Score 08/05/20 0359 4     Pain Loc --      Pain Edu? --      Excl. in GC? --    CONSTITUTIONAL: Alert and oriented and responds appropriately to questions.  Appears uncomfortable. HEAD: Normocephalic EYES: Conjunctivae clear, pupils appear equal, EOM appear intact ENT: normal nose; moist mucous  membranes NECK: Supple, normal ROM CARD: Irregularly irregular and tachycardic, S1 and S2 appreciated; no murmurs, no clicks, no rubs, no gallops RESP: Patient appears short of breath and is slightly tachypneic.  His breath sounds are equal bilaterally and lungs are clear without rhonchi, wheezing or rales.  No hypoxia on room air. ABD/GI: Normal bowel sounds; non-distended; soft, non-tender, no rebound, no guarding, no peritoneal signs, no hepatosplenomegaly BACK: The back appears normal EXT: Normal ROM in all joints; no deformity noted, no edema; no cyanosis, equal pulses in all 4 extremities, no calf tenderness or calf swelling SKIN: Normal color for age and race; warm; no rash on exposed skin NEURO: Moves all extremities equally PSYCH: The patient's mood and manner are appropriate.  ____________________________________________   LABS (all labs ordered are listed, but only abnormal results are displayed)  Labs Reviewed  CBC WITH DIFFERENTIAL/PLATELET - Abnormal; Notable for the following components:      Result Value   RBC 4.07 (*)    HCT 37.8 (*)    All other components within normal limits  BASIC METABOLIC PANEL - Abnormal; Notable for the following components:   Potassium 3.2 (*)    CO2 20 (*)    All other components within normal limits  BRAIN NATRIURETIC PEPTIDE  TROPONIN I (HIGH SENSITIVITY)   ____________________________________________  EKG   EKG Interpretation  Date/Time:  Monday August 05 2020 03:59:09 EST Ventricular Rate:  148 PR Interval:    QRS Duration: 85 QT Interval:  278 QTC Calculation: 422 R Axis:   125 Text Interpretation: Right and left arm electrode reversal, interpretation assumes no reversal a fib rvr Repolarization abnormality, prob rate related Minimal ST elevation, anterolateral leads Confirmed by Rochele RaringWard, Crayton Savarese [phone]([id number]) on 08/05/2020 4:26:23 AM       ____________________________________________  RADIOLOGY Normajean BaxterI, Gigi Onstad, personally viewed  and evaluated these images (plain radiographs) as part of my medical decision making, as well as reviewing the written report by the radiologist.  ED MD interpretation: No edema, infiltrate or pneumothorax.  Official radiology report(s): DG Chest  Portable 1 View  Result Date: 08/05/2020 CLINICAL DATA:  Chest pain and shortness of breath EXAM: PORTABLE CHEST 1 VIEW COMPARISON:  05/29/2020 FINDINGS: Interstitial prominence when compared to before but no Kerley lines or effusion. Normal heart size for technique with stable aortic and hilar contours. No visible effusion or pneumothorax. IMPRESSION: Borderline vascular congestion. Electronically Signed   By: Marnee Spring M.D.   On: 08/05/2020 04:33    ____________________________________________   PROCEDURES  Procedure(s) performed (including Critical Care):  Procedures  CRITICAL CARE Performed by: Baxter Hire Lizzete Gough   Total critical care time: 60 minutes  Critical care time was exclusive of separately billable procedures and treating other patients.  Critical care was necessary to treat or prevent imminent or life-threatening deterioration.  Critical care was time spent personally by me on the following activities: development of treatment plan with patient and/or surrogate as well as nursing, discussions with consultants, evaluation of patient's response to treatment, examination of patient, obtaining history from patient or surrogate, ordering and performing treatments and interventions, ordering and review of laboratory studies, ordering and review of radiographic studies, pulse oximetry and re-evaluation of patient's condition.  ____________________________________________   INITIAL IMPRESSION / ASSESSMENT AND PLAN / ED COURSE  As part of my medical decision making, I reviewed the following data within the electronic MEDICAL RECORD NUMBER Nursing notes reviewed and incorporated, Labs reviewed, EKG interpreted atrial fibrillation, Old EKG  reviewed, Old chart reviewed, Radiograph reviewed , Discussed with admitting physician  and Notes from prior ED visits         Patient here with A. fib with RVR.  Rate currently in the 140s to 150s.  He is hypertensive here and appears uncomfortable but not in significant distress.  He is not currently on anticoagulation.  States he stopped this after having nosebleeds.  His chads vasc 2 score is 3 (age, HTN, PAD).  Given he is hemodynamically stable, I do not feel he is a candidate for cardioversion from the ED given he is stable and ideally would need to be anticoagulated first.  ED PROGRESS  Heart rate has improved with IV diltiazem.  Labs unremarkable.  Troponin is 16.  BNP 93.  Chest x-ray shows vascular congestion without overt edema.  Will discuss with hospitalist for admission.  5:26 AM Discussed patient's case with hospitalist, Dr. Para March.  I have recommended admission and patient (and family if present) agree with this plan. Admitting physician will place admission orders.   I reviewed all nursing notes, vitals, pertinent previous records and reviewed/interpreted all EKGs, lab and urine results, imaging (as available).  5:40 AM  Pt reports he is chest pain-free.  No sign of alcohol withdrawal currently but will place him on CIWA protocol given further chart review documents a history of alcohol abuse. ____________________________________________   FINAL CLINICAL IMPRESSION(S) / ED DIAGNOSES  Final diagnoses:  Atrial fibrillation with RVR (HCC)  Chest pain, unspecified type     ED Discharge Orders    None      *Please note:  Montie Swiderski was evaluated in Emergency Department on 08/05/2020 for the symptoms described in the history of present illness. He was evaluated in the context of the global COVID-19 pandemic, which necessitated consideration that the patient might be at risk for infection with the SARS-CoV-2 virus that causes COVID-19. Institutional protocols and  algorithms that pertain to the evaluation of patients at risk for COVID-19 are in a state of rapid change based on information released by regulatory bodies including the  CDC and federal and [employer]. These policies and algorithms were followed during the patient's care in the ED.  Some ED evaluations and interventions may be delayed as a result of limited staffing during and the pandemic.*   Note:  This document was prepared using Dragon voice recognition software and may include unintentional dictation errors.   Madicyn Mesina, Layla Maw, DO 08/05/20 0526    Angeles Zehner, Layla Maw, DO 08/05/20 [phone]

## 2020-08-05 NOTE — ED Notes (Signed)
MD at bedside. 

## 2020-08-05 NOTE — H&P (Addendum)
History and Physical    Darren Rogers DJT:[id number] DOB: Feb 25, 1956 DOA: 08/05/2020  PCP: Patient, No Pcp Per   Patient coming from: Home  I have personally briefly reviewed patient's old medical records in [facility]  Chief Complaint: Chest pain  HPI: Darren Rogers is a 65 y.o. male with medical history significant for atrial fibrillation on metoprolol and flecainide, who stopped Xarelto on his own due to nosebleeds, who also has history of CAD, PAD, HTN, alcohol use disorder and nicotine use and who admits to overall inconsistent compliance with meds who presents to the emergency room with sudden onset retrosternal chest pain/pressure that started as he was preparing to go to bed.  The pain was nonradiating.  Associated with palpitations, shortness of breath and lightheadedness.  Had no associated nausea, vomiting or diaphoresis.  Denies cough, fever or chills.  Patient states he had been at his sister's house helping her following a fall for the past 4 days and did not have his medication with him.  EMS reported heart rate in the 200s for which she was administered 5 mg IV metoprolol and 4 baby aspirin ED Course: Upon arrival, patient was in A. fib at 159, BP 178/90 respirations 22 with O2 sat 100% on room air.  Temp 98.4.  First troponin negative at 16, BNP 93.2.  BMP and CBC unremarkable except for potassium of 3.2.  Magnesium not done. EKG as reviewed by me : A. fib at 148 with mild J-point elevation in anterolateral leads with T wave inversion, unchanged from EKG in December 2021 Imaging: Chest x-ray with borderline vascular congestion  Patient was given a diltiazem bolus and started on diltiazem infusion with improvement in heart rate to the 1 teens and similarly had improvement in chest pain.  Also received nitroglycerin .  Hospitalist consulted for admission.  Review of Systems: As per HPI otherwise all other systems on review of systems negative.    Past Medical History:   Diagnosis Date  . Claudication (HCC)    a. 06/2015 ABI: R - 0.73, L - 0.73. 30-49% bilat SFA stenosis. 50-74% R Profunda stenosis; b. 01/2017 ABI: R 0.89, L 0.88, TBI R 0.65, L 1.0.  . Erectile dysfunction   . Essential hypertension   . History of echocardiogram    a. 03/29/2014: EF 55-60%, mild LVH, normal RVSP;  b. 05/2015 Echo: EF 60-65%, no rwma, triv AI, mild MR, mildly dil LA.  Marland Kitchen Hyperlipidemia   . Non-obstructive CAD    a. 01/27/2014 Cath: LM nl, LAD mild diff dz w/o obs, LCx no sig obs - scattered 20-30% mLCx, RCA no obs dz, EF 70%; b. 06/2015 MV; EF 60%, no nischemia.  Marland Kitchen PAF (paroxysmal atrial fibrillation) (HCC)    a. Pt says Dx >36yrs ago w/ ? RFCA @ [facility];  b. Recurrent 12/2013;  b. Rx Flecainide and Xarelto-->Intermittent compliance.  Marland Kitchen PVC's (premature ventricular contractions)    a. 05/2017 Zio: 4000 PVCs in 48 hrs (2%). Brief run of SVT.  . Tobacco abuse    a. ongoing - 1 ppd.    Past Surgical History:  Procedure Laterality Date  . CARDIAC CATHETERIZATION    . CARDIAC CATHETERIZATION     [facility]  . LEFT HEART CATH Right 01/27/2014   Procedure: LEFT HEART CATH;  Surgeon: Micheline Chapman, MD;  Location: [facility] CATH LAB;  Service: Cardiovascular;  Laterality: Right;     reports that he has been smoking cigarettes. He has a 15.00 pack-year smoking history. He  has never used smokeless tobacco. He reports current alcohol use. He reports current drug use. Drug: Cocaine.  No Known Allergies  Family History  Problem Relation Age of Onset  . Coronary artery disease Father 37  . Diabetes Mother   . Diabetes Sister   . Diabetes Brother   . Diabetes Maternal Grandmother   . Diabetes Sister   . Diabetes Sister       Prior to Admission medications   Medication Sig Start Date End Date Taking? Authorizing Provider  amoxicillin (AMOXIL) 500 MG capsule Take 1 capsule (500 mg total) by mouth 3 (three) times daily. 12/07/19   Enid Derry, PA-C  aspirin EC 81 MG tablet Take 1 tablet (81  mg total) by mouth daily. Patient not taking: Reported on 10/20/2019 08/16/19   Emily Filbert, MD  flecainide (TAMBOCOR) 100 MG tablet Take 1 tablet (100 mg total) by mouth every 12 (twelve) hours. Patient not taking: Reported on 01/01/2020 08/16/19   Emily Filbert, MD  flecainide (TAMBOCOR) 100 MG tablet Take 1 tablet (100 mg total) by mouth 2 (two) times daily for 14 days. 01/29/20 02/12/20  Delton Prairie, MD  lidocaine (XYLOCAINE) 2 % solution Use as directed 10 mLs in the mouth or throat as needed. 12/07/19   Enid Derry, PA-C  lidocaine (XYLOCAINE) 2 % solution Use as directed 15 mLs in the mouth or throat as needed for mouth pain. 05/30/20   Nita Sickle, MD  metoprolol tartrate (LOPRESSOR) 100 MG tablet Take 0.5 tablets (50 mg total) by mouth 2 (two) times daily. Patient not taking: Reported on 01/01/2020 08/16/19   Emily Filbert, MD  metoprolol tartrate (LOPRESSOR) 25 MG tablet Take 2 tablets (50 mg total) by mouth 2 (two) times daily for 14 days. 01/29/20 02/12/20  Delton Prairie, MD  rivaroxaban (XARELTO) 20 MG TABS tablet Take 1 tablet (20 mg total) by mouth daily with supper. Patient not taking: Reported on 10/20/2019 08/16/19   Emily Filbert, MD  rivaroxaban (XARELTO) 20 MG TABS tablet Take 1 tablet (20 mg total) by mouth daily with supper for 14 days. 01/29/20 02/12/20  Delton Prairie, MD  rosuvastatin (CRESTOR) 20 MG tablet Take 1 tablet (20 mg total) by mouth daily. Patient not taking: Reported on 10/20/2019 08/16/19   Emily Filbert, MD  rosuvastatin (CRESTOR) 20 MG tablet Take 1 tablet (20 mg total) by mouth daily for 14 days. 01/29/20 02/12/20  Delton Prairie, MD    Physical Exam: Vitals:   08/05/20 0359 08/05/20 0400 08/05/20 0425 08/05/20 0430  BP:  (!) 160/101 (S) 122/79 134/86  Pulse:   (!) 119 (!) 102  Resp:   (!) 25 (!) 21  Temp:      TempSrc:      SpO2:   94% 95%  Weight: 70.3 kg     Height: 5\' 6"  (1.676 m)        Vitals:   08/05/20 0359  08/05/20 0400 08/05/20 0425 08/05/20 0430  BP:  (!) 160/101 (S) 122/79 134/86  Pulse:   (!) 119 (!) 102  Resp:   (!) 25 (!) 21  Temp:      TempSrc:      SpO2:   94% 95%  Weight: 70.3 kg     Height: 5\' 6"  (1.676 m)         Constitutional: Alert and oriented x 3 . Not in any apparent distress HEENT:      Head: Normocephalic and atraumatic.  Eyes: PERLA, EOMI, Conjunctivae are normal. Sclera is non-icteric.       Mouth/Throat: Mucous membranes are moist.       Neck: Supple with no signs of meningismus. Cardiovascular: Irregularly irregular and tachycardic, S1 and S2 appreciated; no murmurs, no clicks, no rubs, no gallops 2+ symmetrical distal pulses are present . No JVD. No LE edema Respiratory: Respiratory effort normal .Lungs sounds clear bilaterally. No wheezes, crackles, or rhonchi.  Gastrointestinal: Soft, non tender, and non distended with positive bowel sounds.  Genitourinary: No CVA tenderness. Musculoskeletal: Nontender with normal range of motion in all extremities. No cyanosis, or erythema of extremities. Neurologic:  Face is symmetric. Moving all extremities. No gross focal neurologic deficits . Skin: Skin is warm, dry.  No rash or ulcers Psychiatric: Mood and affect are normal    Labs on Admission: I have personally reviewed following labs and imaging studies  CBC: Recent Labs  Lab 08/05/20 0420  WBC 5.7  NEUTROABS 2.5  HGB 13.0  HCT 37.8*  MCV 92.9  PLT 281   Basic Metabolic Panel: Recent Labs  Lab 08/05/20 0420  NA 141  K 3.2*  CL 110  CO2 20*  GLUCOSE 96  BUN 8  CREATININE 0.85  CALCIUM 8.9   GFR: Estimated Creatinine Clearance: 78.2 mL/min (by C-G formula based on SCr of 0.85 mg/dL). Liver Function Tests: No results for input(s): AST, ALT, ALKPHOS, BILITOT, PROT, ALBUMIN in the last 168 hours. No results for input(s): LIPASE, AMYLASE in the last 168 hours. No results for input(s): AMMONIA in the last 168 hours. Coagulation Profile: No  results for input(s): INR, PROTIME in the last 168 hours. Cardiac Enzymes: No results for input(s): CKTOTAL, CKMB, CKMBINDEX, TROPONINI in the last 168 hours. BNP (last 3 results) No results for input(s): PROBNP in the last 8760 hours. HbA1C: No results for input(s): HGBA1C in the last 72 hours. CBG: No results for input(s): GLUCAP in the last 168 hours. Lipid Profile: No results for input(s): CHOL, HDL, LDLCALC, TRIG, CHOLHDL, LDLDIRECT in the last 72 hours. Thyroid Function Tests: No results for input(s): TSH, T4TOTAL, FREET4, T3FREE, THYROIDAB in the last 72 hours. Anemia Panel: No results for input(s): VITAMINB12, FOLATE, FERRITIN, TIBC, IRON, RETICCTPCT in the last 72 hours. Urine analysis:    Component Value Date/Time   COLORURINE YELLOW (A) 11/16/2014 1255   APPEARANCEUR Clear 07/30/2016 1600   LABSPEC 1.021 11/16/2014 1255   LABSPEC 1.019 03/28/2014 1430   PHURINE 5.0 11/16/2014 1255   GLUCOSEU Negative 07/30/2016 1600   GLUCOSEU Negative 03/28/2014 1430   HGBUR NEGATIVE 11/16/2014 1255   BILIRUBINUR Negative 07/30/2016 1600   BILIRUBINUR Negative 03/28/2014 1430   KETONESUR NEGATIVE 11/16/2014 1255   PROTEINUR Negative 07/30/2016 1600   PROTEINUR NEGATIVE 11/16/2014 1255   NITRITE Negative 07/30/2016 1600   NITRITE NEGATIVE 11/16/2014 1255   LEUKOCYTESUR Negative 07/30/2016 1600   LEUKOCYTESUR Negative 03/28/2014 1430    Radiological Exams on Admission: DG Chest Portable 1 View  Result Date: 08/05/2020 CLINICAL DATA:  Chest pain and shortness of breath EXAM: PORTABLE CHEST 1 VIEW COMPARISON:  05/29/2020 FINDINGS: Interstitial prominence when compared to before but no Kerley lines or effusion. Normal heart size for technique with stable aortic and hilar contours. No visible effusion or pneumothorax. IMPRESSION: Borderline vascular congestion. Electronically Signed   By: Marnee Spring M.D.   On: 08/05/2020 04:33     Assessment/Plan 65 year old male with history of  atrial fibrillation on metoprolol and flecainide, who stopped Xarelto on his own  due to nosebleeds, who also has history of CAD, PAD, alcohol and nicotine abuse and HTN presenting with sudden onset retrosternal chest pain/pressure  associated with palpitations, shortness of breath and lightheadedness.  Reports inconsistent compliance with medication.    Chest pain   CAD -Likely secondary to rapid A. fib.  Given improvement with rate control.  First troponin was negative and EKG showed no new changes from priors -Continue to trend troponin -Continue antiplatelet, statin and beta-blocker.  Patient got a full dose aspirin in route to the hospital -Cardiology consult    Atrial fibrillation with rapid ventricular response (HCC) -Heart rate in the 200s on arrival of EMS receiving IV metoprolol 5 mg in route -Received IV diltiazem bolus in the ER and started on infusion -Continue diltiazem infusion -CHA2DS2-VASc of 3.  Was previously on Xarelto for stroke prevention but patient self discontinued the medication -Cardiology consult to discuss anticoagulation.   Patient follows with Dr. Kirke Corin  Hypokalemia -Oral supplementation.  Potassium was 3.2    PAD (peripheral artery disease) (HCC) -Continue aspirin and statins    Essential hypertension -Blood pressure controlled on current IV diltiazem.  Reintroduce home antihypertensives as appropriate  Alcohol use disorder, moderate -EtOH withdrawal protocol -Patient drinks on average 240 ounce beers daily  Nicotine dependence -Nicotine patch -Smokes half pack a day  Medication noncompliance -Consider TOC consult to address medication cost    DVT prophylaxis: Lovenox  Code Status: full code  Family Communication:  none  Disposition Plan: Back to previous home environment Consults called: Cardiology Status:At the time of admission, it appears that the appropriate admission status for this patient is INPATIENT. This is judged to be reasonable  and necessary in order to provide the required intensity of service to ensure the patient's safety given the presenting symptoms, physical exam findings, and initial radiographic and laboratory data in the context of their  Comorbid conditions.   Patient requires inpatient status due to high intensity of service, high risk for further deterioration and high frequency of surveillance required.   I certify that at the point of admission it is my clinical judgment that the patient will require inpatient hospital care spanning beyond 2 midnights     Andris Baumann MD [facility]     08/05/2020, 5:31 AM

## 2020-08-05 NOTE — Progress Notes (Signed)
Patient arrived to unit. Admit with Hep and Cardizem gtt. Vitals, skin checked and WDL.

## 2020-08-05 NOTE — ED Triage Notes (Signed)
Pt presents to ER from home with c/o CP that started around 0215.  Pt stated pain felt like "an elephant was sitting on my chest."  On EMS arrival, pt was in uncontrolled afib on arrival with HR >200.  Pt was also SOB on arrival and ems placed pt on 3L  with improvement to 96% on O2.  Pt given 5 mg metoprolol IV, and 700 mL NS pta.  CBG 175 w/ems.  Pt states he ran out of meds around 4 days ago.

## 2020-08-05 NOTE — ED Notes (Signed)
Pt provided phone to contact family 

## 2020-08-05 NOTE — ED Notes (Signed)
Dr Ashok Pall notified of troponin 50, no new orders at this time

## 2020-08-05 NOTE — ED Notes (Signed)
Informed RN bed assigned 1533 
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
## 2020-08-05 NOTE — Consult Note (Signed)
ANTICOAGULATION CONSULT NOTE - Initial Consult  Pharmacy Consult for Heparin Infusion Indication: atrial fibrillation  No Known Allergies  Patient Measurements: Height: 5\' 6"  (167.6 cm) Weight: 70.3 kg (155 lb) IBW/kg (Calculated) : 63.8 Heparin Dosing Weight: 70.3 kg  Vital Signs: Temp: 98.4 F (36.9 C) (03/07 0356) Temp Source: Oral (03/07 0356) BP: 122/74 (03/07 0830) Pulse Rate: 87 (03/07 0830)  Labs: Recent Labs    08/05/20 0420 08/05/20 0621 08/05/20 0727  HGB 13.0  --   --   HCT 37.8*  --   --   PLT 281  --   --   CREATININE 0.85  --   --   TROPONINIHS 16 50* 77*    Estimated Creatinine Clearance: 78.2 mL/min (by C-G formula based on SCr of 0.85 mg/dL).   Medical History: Past Medical History:  Diagnosis Date  . Claudication (HCC)    a. 06/2015 ABI: R - 0.73, L - 0.73. 30-49% bilat SFA stenosis. 50-74% R Profunda stenosis; b. 01/2017 ABI: R 0.89, L 0.88, TBI R 0.65, L 1.0.  . Erectile dysfunction   . Essential hypertension   . History of echocardiogram    a. 03/29/2014: EF 55-60%, mild LVH, normal RVSP;  b. 05/2015 Echo: EF 60-65%, no rwma, triv AI, mild MR, mildly dil LA.  06/2015 Hyperlipidemia   . Non-obstructive CAD    a. 01/27/2014 Cath: LM nl, LAD mild diff dz w/o obs, LCx no sig obs - scattered 20-30% mLCx, RCA no obs dz, EF 70%; b. 06/2015 MV; EF 60%, no nischemia.  07/2015 PAF (paroxysmal atrial fibrillation) (HCC)    a. Pt says Dx >32yrs ago w/ ? RFCA @ [facility];  b. Recurrent 12/2013;  b. Rx Flecainide and Xarelto-->Intermittent compliance.  01/2014 PVC's (premature ventricular contractions)    a. 05/2017 Zio: 4000 PVCs in 48 hrs (2%). Brief run of SVT.  . Tobacco abuse    a. ongoing - 1 ppd.    Medications:  (Not in a hospital admission)  Scheduled:  . aspirin EC  81 mg Oral Daily  . folic acid  1 mg Oral Daily  . furosemide  20 mg Intravenous Once  . LORazepam  0-4 mg Intravenous Q6H   Or  . LORazepam  0-4 mg Oral Q6H  . [START ON 08/07/2020] LORazepam  0-4 mg  Intravenous Q12H   Or  . [START ON 08/07/2020] LORazepam  0-4 mg Oral Q12H  . metoprolol tartrate  12.5 mg Oral Q6H  . multivitamin with minerals  1 tablet Oral Daily  . rosuvastatin  40 mg Oral Daily  . thiamine  100 mg Oral Daily   Or  . thiamine  100 mg Intravenous Daily   Infusions:  . diltiazem (CARDIZEM) infusion 15 mg/hr (08/05/20 0534)   PRN: acetaminophen, LORazepam **OR** LORazepam, nitroGLYCERIN, ondansetron (ZOFRAN) IV Anti-infectives (From admission, onward)   None      Assessment: Pharmacy has been consulted to initiate Heparin infusion in 65yo patient with history of nonobstructive CAD and PAF noncompliant with Rivaroxaban who presents to the ED with chest pain. Upon arrival, patient was found to be in Afib with RVR. Troponin levels continue to rise 16-->77. Patient stated he stopped taking his Rivaroxaban d/t recurring nose bleeds. Has been taking metoprolol and flecainide for Afib, instead. On Diltiazem gtt currently.  Baseline labs: Hgb 13, PLTs 281, HL 0.31(likely elevated d/t recent enoxaparin dose)  Goal of Therapy:  Heparin level 0.3-0.7 units/ml Monitor platelets by anticoagulation protocol: Yes   Plan:  Patient received enoxaparin 40mg  injection @0716  today(3/7). Will avoid bolus and start heparin infusion at 1100 units/hr now Check anti-Xa level in 6 hours and daily while on heparin Continue to monitor H&H and platelets  Caydyn Sprung A Yazid Pop 08/05/2020,10:20 AM

## 2020-08-05 NOTE — Consult Note (Signed)
ANTICOAGULATION CONSULT NOTE - Initial Consult  Pharmacy Consult for Heparin Infusion Indication: atrial fibrillation  No Known Allergies  Patient Measurements: Height: 5\' 6"  (167.6 cm) Weight: 70.3 kg (155 lb) IBW/kg (Calculated) : 63.8 Heparin Dosing Weight: 70.3 kg  Vital Signs: Temp: 98.3 F (36.8 C) (03/07 1650) BP: 113/68 (03/07 1650) Pulse Rate: 55 (03/07 1650)  Labs: Recent Labs    08/05/20 0420 08/05/20 0621 08/05/20 0727 08/05/20 1102 08/05/20 1323 08/05/20 1805  HGB 13.0  --   --   --   --   --   HCT 37.8*  --   --   --   --   --   PLT 281  --   --   --   --   --   APTT  --   --   --  33  --   --   HEPARINUNFRC  --   --   --  0.31  --  0.65  CREATININE 0.85  --   --   --   --   --   TROPONINIHS 16 50* 77*  --  104*  --     Estimated Creatinine Clearance: 78.2 mL/min (by C-G formula based on SCr of 0.85 mg/dL).   Medical History: Past Medical History:  Diagnosis Date  . Claudication (HCC)    a. 06/2015 ABI: R - 0.73, L - 0.73. 30-49% bilat SFA stenosis. 50-74% R Profunda stenosis; b. 01/2017 ABI: R 0.89, L 0.88, TBI R 0.65, L 1.0.  . Erectile dysfunction   . Essential hypertension   . History of echocardiogram    a. 03/29/2014: EF 55-60%, mild LVH, normal RVSP;  b. 05/2015 Echo: EF 60-65%, no rwma, triv AI, mild MR, mildly dil LA.  06/2015 Hyperlipidemia   . Non-obstructive CAD    a. 01/27/2014 Cath: LM nl, LAD mild diff dz w/o obs, LCx no sig obs - scattered 20-30% mLCx, RCA no obs dz, EF 70%; b. 06/2015 MV; EF 60%, no nischemia.  07/2015 PAF (paroxysmal atrial fibrillation) (HCC)    a. Pt says Dx >31yrs ago w/ ? RFCA @ [facility];  b. Recurrent 12/2013;  b. Rx Flecainide and Xarelto-->Intermittent compliance.  01/2014 PVC's (premature ventricular contractions)    a. 05/2017 Zio: 4000 PVCs in 48 hrs (2%). Brief run of SVT.  . Tobacco abuse    a. ongoing - 1 ppd.    Medications:  Medications Prior to Admission  Medication Sig Dispense Refill Last Dose  . aspirin EC 81 MG  tablet Take 1 tablet (81 mg total) by mouth daily. 30 tablet 1 Past Week at Unknown time  . flecainide (TAMBOCOR) 100 MG tablet Take 1 tablet (100 mg total) by mouth every 12 (twelve) hours. 60 tablet 1 Past Week at Unknown time  . metoprolol tartrate (LOPRESSOR) 100 MG tablet Take 0.5 tablets (50 mg total) by mouth 2 (two) times daily. 30 tablet 1 Past Week at Unknown time  . flecainide (TAMBOCOR) 100 MG tablet Take 1 tablet (100 mg total) by mouth 2 (two) times daily for 14 days. 28 tablet 0   . metoprolol tartrate (LOPRESSOR) 25 MG tablet Take 2 tablets (50 mg total) by mouth 2 (two) times daily for 14 days. 56 tablet 0   . rivaroxaban (XARELTO) 20 MG TABS tablet Take 1 tablet (20 mg total) by mouth daily with supper for 14 days. 14 tablet 0   . rosuvastatin (CRESTOR) 20 MG tablet Take 1 tablet (20 mg total) by mouth  daily for 14 days. 14 tablet 0    Scheduled:  . aspirin EC  81 mg Oral Daily  . folic acid  1 mg Oral Daily  . LORazepam  0-4 mg Intravenous Q6H   Or  . LORazepam  0-4 mg Oral Q6H  . [START ON 08/07/2020] LORazepam  0-4 mg Intravenous Q12H   Or  . [START ON 08/07/2020] LORazepam  0-4 mg Oral Q12H  . metoprolol tartrate  12.5 mg Oral Q6H  . multivitamin with minerals  1 tablet Oral Daily  . rosuvastatin  40 mg Oral Daily  . thiamine  100 mg Oral Daily   Or  . thiamine  100 mg Intravenous Daily   Infusions:  . diltiazem (CARDIZEM) infusion 10 mg/hr (08/05/20 1719)  . heparin 1,100 Units/hr (08/05/20 1719)   PRN: acetaminophen, LORazepam **OR** LORazepam, nitroGLYCERIN, ondansetron (ZOFRAN) IV Anti-infectives (From admission, onward)   None      Assessment: Pharmacy has been consulted to initiate Heparin infusion in 64yo patient with history of nonobstructive CAD and PAF noncompliant with Rivaroxaban who presents to the ED with chest pain. Upon arrival, patient was found to be in Afib with RVR. Troponin levels continue to rise 16-->77. Patient stated he stopped taking his  Rivaroxaban d/t recurring nose bleeds. Has been taking metoprolol and flecainide for Afib, instead. On Diltiazem gtt currently.  Baseline labs: Hgb 13, PLTs 281, HL 0.31(likely elevated d/t recent enoxaparin dose)  3/7 1805 HL 0.65, therapeutic x 1 @ 1100 units/hr  Goal of Therapy:  Heparin level 0.3-0.7 units/ml Monitor platelets by anticoagulation protocol: Yes   Plan:  Heparin therapeutic x 1 on current infusion rate of 1100 units/hr. Will continue heparin infusion at 1100 units/hr now Re-check anti-Xa level in 6 hours to confirm Continue to monitor H&H and platelets  Sharen Hones, PharmD, BCPS Clinical Pharmacist  08/05/2020,6:36 PM

## 2020-08-05 NOTE — TOC Initial Note (Signed)
Transition of Care [facility]) - Initial/Assessment Note    Patient Details  Name: Darren Rogers MRN: [id number] Date of Birth: 08-13-1955  Transition of Care [facility]) CM/SW Contact:    Marina Goodell Phone Number: [phone] 08/05/2020, 12:34 PM  Clinical Narrative:                  Patient presents to [facility] ED due to chest pain and tachycardia. Patient is currently staying with sister to assist after a fall. Patient is able to perform all ADLs without issues.  Patient is sporadically non-compliant with medications and doctor's appointments.  Patient stopped taking his medication without updating his cardiologist.  Patient has a history of alcohol use, drinking on average two 40 ounce beers per day. Patient stated he use cocaine a week ago.  Expected Discharge Plan: Home/Self Care Barriers to Discharge: Continued Medical Work up   Patient Goals and CMS Choice        Expected Discharge Plan and Services Expected Discharge Plan: Home/Self Care In-house Referral: Clinical Social Work   Post Acute Care Choice: Home Health Living arrangements for the past 2 months: Single Family Home                                      Prior Living Arrangements/Services Living arrangements for the past 2 months: Single Family Home Lives with:: Siblings Darrol Angel (Sister)   [phone]) Patient language and need for interpreter reviewed:: Yes Do you feel safe going back to the place where you live?: Yes      Need for Family Participation in Patient Care: No (Comment) Care giver support system in place?: No (comment)   Criminal Activity/Legal Involvement Pertinent to Current Situation/Hospitalization: No - Comment as needed  Activities of Daily Living      Permission Sought/Granted Permission sought to share information with : Family Supports    Share Information with NAME: Darrol Angel (Sister)   [phone]           Emotional Assessment Appearance:: Appears  stated age Attitude/Demeanor/Rapport: Engaged Affect (typically observed): Stable Orientation: : Oriented to Self,Oriented to Place,Oriented to Situation Alcohol / Substance Use: Illicit Drugs,Tobacco Use Psych Involvement: No (comment)  Admission diagnosis:  Rapid atrial fibrillation [facility]) [I48.91] Patient Active Problem List   Diagnosis Date Noted  . Rapid atrial fibrillation (HCC) 08/05/2020  . Alcohol use disorder, moderate, dependence (HCC) 08/05/2020  . Cocaine abuse (HCC) 08/05/2020  . Alcohol abuse 10/20/2019  . Depression 10/20/2019  . A-fib (HCC) 01/15/2017  . Personal history of tobacco use, presenting hazards to health 08/17/2016  . BPH associated with nocturia 07/30/2016  . IFG (impaired fasting glucose) 07/30/2016  . Chest pain 08/22/2015  . Essential hypertension   . PAD (peripheral artery disease) (HCC)   . 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 CAD in native artery 04/11/2014  . Paroxysmal atrial fibrillation (HCC)   . Tobacco abuse   . Hyperlipidemia   . Atrial fibrillation with rapid ventricular response (HCC) 01/27/2014   PCP:  Patient, No Pcp Per Pharmacy:   [facility] [id number] [place], [place] - [address] [place] [address] Phone: [phone] Fax: [phone]  [facility] - [place], [place] - [address] [place] [address] Phone: [phone] Fax: [phone]     Social Determinants of Health (SDOH) Interventions    Readmission Risk Interventions No flowsheet  data found.

## 2020-08-06 ENCOUNTER — Inpatient Hospital Stay (HOSPITAL_BASED_OUTPATIENT_CLINIC_OR_DEPARTMENT_OTHER): Payer: Medicare Other

## 2020-08-06 ENCOUNTER — Inpatient Hospital Stay (HOSPITAL_BASED_OUTPATIENT_CLINIC_OR_DEPARTMENT_OTHER)
Admit: 2020-08-06 | Discharge: 2020-08-06 | Disposition: A | Discharge status: Home / Self Care | Payer: Medicare Other | Attending: Physician Assistant | Admitting: Physician Assistant

## 2020-08-06 DIAGNOSIS — I4891 Unspecified atrial fibrillation: Secondary | ICD-10-CM

## 2020-08-06 DIAGNOSIS — I248 Other forms of acute ischemic heart disease: Secondary | ICD-10-CM

## 2020-08-06 DIAGNOSIS — F1910 Other psychoactive substance abuse, uncomplicated: Secondary | ICD-10-CM | POA: Diagnosis not present

## 2020-08-06 DIAGNOSIS — R079 Chest pain, unspecified: Secondary | ICD-10-CM | POA: Diagnosis not present

## 2020-08-06 LAB — BASIC METABOLIC PANEL
Anion gap: 8 (ref 5–15)
BUN: 12 mg/dL (ref 8–23)
CO2: 26 mmol/L (ref 22–32)
Calcium: 9.4 mg/dL (ref 8.9–10.3)
Chloride: 104 mmol/L (ref 98–111)
Creatinine, Ser: 0.85 mg/dL (ref 0.61–1.24)
GFR, Estimated: >60 mL/min (ref >60)
Glucose, Bld: 114 mg/dL — ABNORMAL HIGH (ref 70–99)
Potassium: 4 mmol/L (ref 3.5–5.1)
Sodium: 138 mmol/L (ref 135–145)

## 2020-08-06 LAB — ECHOCARDIOGRAM COMPLETE
AR max vel: 2.12 cm2
AV Area VTI: 1.82 cm2
AV Area mean vel: 1.98 cm2
AV Mean grad: 3 mmHg
AV Peak grad: 5.1 mmHg
Ao pk vel: 1.13 m/s
Area-P 1/2: 3.39 cm2
Calc EF: 53.5 %
Height: 66 in
MV VTI: 1.71 cm2
S' Lateral: 3.3 cm
Single Plane A2C EF: 53.2 %
Single Plane A4C EF: 54.1 %
Weight: 2480 oz

## 2020-08-06 LAB — NM MYOCAR MULTI W/SPECT W/WALL MOTION / EF
LV dias vol: 80 mL (ref 62–150)
LV sys vol: 46 mL
Peak HR: 90 {beats}/min
Percent HR: 58 %
Rest HR: 73 {beats}/min
TID: 1.02

## 2020-08-06 LAB — HEPARIN LEVEL (UNFRACTIONATED)
Heparin Unfractionated: 0.69 IU/mL (ref 0.30–0.70)
Heparin Unfractionated: 0.85 IU/mL — ABNORMAL HIGH (ref 0.30–0.70)

## 2020-08-06 LAB — TROPONIN I (HIGH SENSITIVITY): Troponin I (High Sensitivity): 57 ng/L — ABNORMAL HIGH (ref <18)

## 2020-08-06 LAB — MAGNESIUM: Magnesium: 2.3 mg/dL (ref 1.7–2.4)

## 2020-08-06 MED ORDER — DILTIAZEM HCL ER COATED BEADS 240 MG PO CP24: 240.0000 mg | ORAL_CAPSULE | Freq: Every day | ORAL | 1 refills | Status: DC

## 2020-08-06 MED ORDER — TECHNETIUM TC 99M TETROFOSMIN IV KIT
10.0000 | PACK | Freq: Once | INTRAVENOUS | Status: AC | PRN
  Administered 2020-08-06: 10.04 via INTRAVENOUS

## 2020-08-06 MED ORDER — RIVAROXABAN 20 MG PO TABS: 20.0000 mg | ORAL_TABLET | Freq: Every day | ORAL | Status: DC

## 2020-08-06 MED ORDER — RIVAROXABAN 20 MG PO TABS: 20.0000 mg | ORAL_TABLET | Freq: Every day | ORAL | 1 refills | Status: DC

## 2020-08-06 MED ORDER — REGADENOSON 0.4 MG/5ML IV SOLN
0.4000 mg | Freq: Once | INTRAVENOUS | Status: AC
  Administered 2020-08-06: 0.4 mg via INTRAVENOUS
  Filled 2020-08-06: qty 5

## 2020-08-06 MED ORDER — METOPROLOL SUCCINATE ER 100 MG PO TB24: 100.0000 mg | ORAL_TABLET | Freq: Every day | ORAL | Status: DC

## 2020-08-06 MED ORDER — METOPROLOL SUCCINATE ER 100 MG PO TB24: 100.0000 mg | ORAL_TABLET | Freq: Every day | ORAL | 1 refills | Status: DC

## 2020-08-06 MED ORDER — RIVAROXABAN 20 MG PO TABS: 20.0000 mg | ORAL_TABLET | Freq: Every day | ORAL | 0 refills | Status: DC

## 2020-08-06 MED ORDER — DILTIAZEM HCL ER COATED BEADS 120 MG PO CP24: 240.0000 mg | ORAL_CAPSULE | Freq: Every day | ORAL | Status: DC

## 2020-08-06 MED ORDER — TECHNETIUM TC 99M TETROFOSMIN IV KIT
30.0000 | PACK | Freq: Once | INTRAVENOUS | Status: AC | PRN
  Administered 2020-08-06: 32.42 via INTRAVENOUS

## 2020-08-06 NOTE — Consult Note (Signed)
ANTICOAGULATION CONSULT NOTE - Initial Consult  Pharmacy Consult for Heparin Infusion Indication: atrial fibrillation  No Known Allergies  Patient Measurements: Height: 5\' 6"  (167.6 cm) Weight: 70.3 kg (155 lb) IBW/kg (Calculated) : 63.8 Heparin Dosing Weight: 70.3 kg  Vital Signs: Temp: 98.5 F (36.9 C) (03/08 0039) Temp Source: Oral (03/08 0039) BP: 110/66 (03/08 0039) Pulse Rate: 59 (03/08 0039)  Labs: Recent Labs    08/05/20 0420 08/05/20 0621 08/05/20 0727 08/05/20 1102 08/05/20 1323 08/05/20 1805 08/06/20 0000  HGB 13.0  --   --   --   --   --   --   HCT 37.8*  --   --   --   --   --   --   PLT 281  --   --   --   --   --   --   APTT  --   --   --  33  --   --   --   HEPARINUNFRC  --   --   --  0.31  --  0.65 0.69  CREATININE 0.85  --   --   --   --   --   --   TROPONINIHS 16 50* 77*  --  104*  --   --     Estimated Creatinine Clearance: 78.2 mL/min (by C-G formula based on SCr of 0.85 mg/dL).   Medical History: Past Medical History:  Diagnosis Date  . Claudication (HCC)    a. 06/2015 ABI: R - 0.73, L - 0.73. 30-49% bilat SFA stenosis. 50-74% R Profunda stenosis; b. 01/2017 ABI: R 0.89, L 0.88, TBI R 0.65, L 1.0.  . Erectile dysfunction   . Essential hypertension   . History of echocardiogram    a. 03/29/2014: EF 55-60%, mild LVH, normal RVSP;  b. 05/2015 Echo: EF 60-65%, no rwma, triv AI, mild MR, mildly dil LA.  06/2015 Hyperlipidemia   . Non-obstructive CAD    a. 01/27/2014 Cath: LM nl, LAD mild diff dz w/o obs, LCx no sig obs - scattered 20-30% mLCx, RCA no obs dz, EF 70%; b. 06/2015 MV; EF 60%, no nischemia.  07/2015 PAF (paroxysmal atrial fibrillation) (HCC)    a. Pt says Dx >14yrs ago w/ ? RFCA @ [facility];  b. Recurrent 12/2013;  b. Rx Flecainide and Xarelto-->Intermittent compliance.  01/2014 PVC's (premature ventricular contractions)    a. 05/2017 Zio: 4000 PVCs in 48 hrs (2%). Brief run of SVT.  . Tobacco abuse    a. ongoing - 1 ppd.    Medications:  Medications  Prior to Admission  Medication Sig Dispense Refill Last Dose  . aspirin EC 81 MG tablet Take 1 tablet (81 mg total) by mouth daily. 30 tablet 1 Past Week at Unknown time  . flecainide (TAMBOCOR) 100 MG tablet Take 1 tablet (100 mg total) by mouth every 12 (twelve) hours. 60 tablet 1 Past Week at Unknown time  . metoprolol tartrate (LOPRESSOR) 100 MG tablet Take 0.5 tablets (50 mg total) by mouth 2 (two) times daily. 30 tablet 1 Past Week at Unknown time  . flecainide (TAMBOCOR) 100 MG tablet Take 1 tablet (100 mg total) by mouth 2 (two) times daily for 14 days. 28 tablet 0   . metoprolol tartrate (LOPRESSOR) 25 MG tablet Take 2 tablets (50 mg total) by mouth 2 (two) times daily for 14 days. 56 tablet 0   . rivaroxaban (XARELTO) 20 MG TABS tablet Take 1 tablet (20 mg total) by  mouth daily with supper for 14 days. 14 tablet 0   . rosuvastatin (CRESTOR) 20 MG tablet Take 1 tablet (20 mg total) by mouth daily for 14 days. 14 tablet 0    Scheduled:  . aspirin EC  81 mg Oral Daily  . diltiazem  90 mg Oral Q6H  . folic acid  1 mg Oral Daily  . LORazepam  0-4 mg Intravenous Q6H   Or  . LORazepam  0-4 mg Oral Q6H  . [START ON 08/07/2020] LORazepam  0-4 mg Intravenous Q12H   Or  . [START ON 08/07/2020] LORazepam  0-4 mg Oral Q12H  . metoprolol tartrate  12.5 mg Oral Q6H  . multivitamin with minerals  1 tablet Oral Daily  . rosuvastatin  40 mg Oral Daily  . thiamine  100 mg Oral Daily   Or  . thiamine  100 mg Intravenous Daily   Infusions:  . diltiazem (CARDIZEM) infusion Stopped (08/06/20 0044)  . heparin 1,100 Units/hr (08/05/20 1719)   PRN: acetaminophen, LORazepam **OR** LORazepam, nitroGLYCERIN, ondansetron (ZOFRAN) IV Anti-infectives (From admission, onward)   None      Assessment: Pharmacy has been consulted to initiate Heparin infusion in 64yo patient with history of nonobstructive CAD and PAF noncompliant with Rivaroxaban who presents to the ED with chest pain. Upon arrival, patient  was found to be in Afib with RVR. Troponin levels continue to rise 16-->77. Patient stated he stopped taking his Rivaroxaban d/t recurring nose bleeds. Has been taking metoprolol and flecainide for Afib, instead. On Diltiazem gtt currently.  Baseline labs: Hgb 13, PLTs 281, HL 0.31(likely elevated d/t recent enoxaparin dose)  3/7 1805 HL 0.65, therapeutic x 1 @ 1100 units/hr 3/8 0000 HL 0.69, therapeutic x 2  Goal of Therapy:  Heparin level 0.3-0.7 units/ml Monitor platelets by anticoagulation protocol: Yes   Plan:  Heparin therapeutic x 2 on current infusion rate of 1100 units/hr. Will continue heparin infusion at 1100 units/hr now Re-check HL daily while on heparin. Continue to monitor H&H and platelets  Otelia Sergeant, PharmD, [facility] 08/06/2020 1:19 AM

## 2020-08-06 NOTE — Progress Notes (Signed)
Progress Note  Patient Name: Darren Rogers Date of Encounter: 08/06/2020  Primary Cardiologist: Kirke Corin  Subjective   He remains in Afib with controlled ventricular response. No chest pain, dyspnea, palpitations, dizziness, presyncope, or syncope. He has tolerated heparin gtt without issues. HS-Tn peaked at 104 and is down trending. Echo is pending. He is for [facility] MPI today. BP stable.   Inpatient Medications    Scheduled Meds: . aspirin EC  81 mg Oral Daily  . diltiazem  90 mg Oral Q6H  . folic acid  1 mg Oral Daily  . LORazepam  0-4 mg Intravenous Q6H   Or  . LORazepam  0-4 mg Oral Q6H  . [START ON 08/07/2020] LORazepam  0-4 mg Intravenous Q12H   Or  . [START ON 08/07/2020] LORazepam  0-4 mg Oral Q12H  . metoprolol tartrate  12.5 mg Oral Q6H  . multivitamin with minerals  1 tablet Oral Daily  . rosuvastatin  40 mg Oral Daily  . thiamine  100 mg Oral Daily   Or  . thiamine  100 mg Intravenous Daily   Continuous Infusions: . heparin 1,100 Units/hr (08/06/20 0824)   PRN Meds: acetaminophen, LORazepam **OR** LORazepam, nitroGLYCERIN, ondansetron (ZOFRAN) IV   Vital Signs    Vitals:   08/06/20 0238 08/06/20 0346 08/06/20 0431 08/06/20 0531  BP:  105/68 105/68   Pulse: (!) 56 60 70 69  Resp:  18    Temp:  98.3 F (36.8 C)    TempSrc:  Oral    SpO2:  100%    Weight:      Height:        Intake/Output Summary (Last 24 hours) at 08/06/2020 0918 Last data filed at 08/06/2020 0824 Gross per 24 hour  Intake 701.47 ml  Output 2900 ml  Net -2198.53 ml   Filed Weights   08/05/20 0359  Weight: 70.3 kg    Telemetry    Afib with ventricular rates in the 60s to 80s bpm - Personally Reviewed  ECG    No new tracings - Personally Reviewed  Physical Exam   GEN: No acute distress.   Neck: No JVD. Cardiac: IRIR, II/VI systolic murmur at the base, no rubs, or gallops.  Respiratory: Clear to auscultation bilaterally.  GI: Soft, nontender, non-distended.   MS: No  edema; No deformity. Neuro:  Alert and oriented x 3; Nonfocal.  Psych: Normal affect.  Labs    Chemistry Recent Labs  Lab 08/05/20 0420 08/06/20 0446  NA 141 138  K 3.2* 4.0  CL 110 104  CO2 20* 26  GLUCOSE 96 114*  BUN 8 12  CREATININE 0.85 0.85  CALCIUM 8.9 9.4  GFRNONAA >60 >60  ANIONGAP 11 8     Hematology Recent Labs  Lab 08/05/20 0420  WBC 5.7  RBC 4.07*  HGB 13.0  HCT 37.8*  MCV 92.9  MCH 31.9  MCHC 34.4  RDW 14.3  PLT 281    Cardiac EnzymesNo results for input(s): TROPONINI in the last 168 hours. No results for input(s): TROPIPOC in the last 168 hours.   BNP Recent Labs  Lab 08/05/20 0420  BNP 93.2     DDimer No results for input(s): DDIMER in the last 168 hours.   Radiology    DG Chest Portable 1 View  Result Date: 08/05/2020 IMPRESSION: Borderline vascular congestion. Electronically Signed   By: Marnee Spring M.D.   On: 08/05/2020 04:33    Cardiac Studies   2D echo pending  Patient Profile     65 y.o. male with history of nonobstructive CAD, PAF noncompliant with Xarelto, polysubstance abuse, HTN, HLD, and ED who is being seen today for the evaluation of Afib with RVR and chest pain at the request of Dr. Para March.  Assessment & Plan    1. PAF with RVR: -Recurrence of A. fib is likely in the setting of medication noncompliance -Currently, he remains in Afib with controlled ventricular response with rates in the 60s to 80s bpm and is asymptomatic  -For now, continue Lopressor 12.5 mg q 6 hours and short acting diltiazem -Prior to discharge, these medications should be consolidated to once daily dosing  -CHA2DS2-VASc at least 2 (HTN, vascular disease) -Previously, he has been noncompliant and refused outpatient [facility] -He reports he is agreeable to start Xarelto 20 mg at dinner, which should be started at discharge -Should he have recurrent epistaxis with this, he should been seen by ENT -For now, he remains on heparin gtt while we await  his Lexiscan MPI later today -Avoid AAT at this time given he has not been adequately anticoagulated prior to presentation  -As long as his ventricular rates remain well controlled, he can follow up as an outpatient for consideration of DCCV after he has been adequately anticoagulated without interruption for 3-4 weeks (assuming he is compliance with OAC) -Discussed need to discontinue alcohol, cocaine, and tobacco -Potassium repleted -Magnesium at goal -TSH normal -Echo pending  2.  Nonobstructive CAD with elevated troponin: -He has history of chronic atypical chest pain nonobstructive disease on prior -Currently without chest pain -Mildly elevated troponin peaking at 104 and subsequently downtredning likely in the setting of supply demand ischemia with underlying A. fib with RVR -Echo pending -ASA for now, which can be stopped once he is placed on Xarelto prior to discharge  -Heparin drip as above  -Lexiscan MPI today -NPO  3.  Polysubstance abuse: -Urine drug screen negative this admission (he reports last using cocaine 1 week ago) -He was last cocaine positive in 08/2019 -Complete cessation recommended  4.  HTN: -Blood pressure well controlled -Meds as above  5.  HLD: -Last LDL of 86 from 10/2017 -Continue PTA rosuvastatin -Follow-up as outpatient  6.  PAD: -Follow-up as an outpatient  7.  Hypokalemia: -Repleted    For questions or updates, please contact [facility] Please consult [email or web address] for contact info under Cardiology/STEMI.    Signed, Eula Listen, PA-C [facility] Pager: [phone] 08/06/2020, 9:18 AM

## 2020-08-06 NOTE — Progress Notes (Signed)
*  PRELIMINARY RESULTS* Echocardiogram 2D Echocardiogram has been performed.  Darren Rogers 08/06/2020, 8:38 AM

## 2020-08-06 NOTE — Progress Notes (Signed)
Initial Nutrition Assessment  DOCUMENTATION CODES:   Severe malnutrition in context of social or environmental circumstances  INTERVENTION:   Ensure Enlive po TID, each supplement provides 350 kcal and 20 grams of protein  MVI, thiamine and folic acid po daily  Pt at high refeed risk; recommend monitor potassium, magnesium and phosphorus labs daily until stable  NUTRITION DIAGNOSIS:   Severe Malnutrition related to social / environmental circumstances (substance abuse) as evidenced by moderate fat depletion,severe fat depletion,moderate muscle depletion,severe muscle depletion.  GOAL:   Patient will meet greater than or equal to 90% of their needs  MONITOR:   PO intake,Supplement acceptance,Labs,Weight trends,Skin,I & O's  REASON FOR ASSESSMENT:   Malnutrition Screening Tool    ASSESSMENT:   65 y.o. male with medical history significant for atrial fibrillation on metoprolol and flecainide, who stopped Xarelto on his own due to nosebleeds, CAD, PAD, HTN, alcohol use disorder and substance abuse and who presents with chest pain  Met with pt in room today. Pt reports poor appetite and oral intake at baseline r/t "I am too busy to eat". RD suspects pt with poor oral intake at baseline r/t etoh abuse as pt is malnourished. Pt reports that his appetite is good in hospital; pt documented to be eating 100% of meals. Pt reports that he does not drink supplements at baseline but that he has had them before and he enjoys the strawberry flavor. RD will add supplements to help pt meet his estimated needs. Pt is at refeed risk. Per chart, pt appears weight stable at baseline.   Medications reviewed and include: folic acid, MVI, thiamine   Labs reviewed: K 4.0 wnl, Mg 2.3 wnl  NUTRITION - FOCUSED PHYSICAL EXAM:  Flowsheet Row Most Recent Value  Orbital Region Moderate depletion  Upper Arm Region Severe depletion  Thoracic and Lumbar Region Moderate depletion  Buccal Region Moderate  depletion  Temple Region Moderate depletion  Clavicle Bone Region Severe depletion  Clavicle and Acromion Bone Region Severe depletion  Scapular Bone Region Moderate depletion  Dorsal Hand Moderate depletion  Patellar Region Severe depletion  Anterior Thigh Region Severe depletion  Posterior Calf Region Severe depletion  Edema (RD Assessment) None  Hair Reviewed  Eyes Reviewed  Mouth Reviewed  Skin Reviewed  Nails Reviewed     Diet Order:   Diet Order            Diet Heart Room service appropriate? Yes; Fluid consistency: Thin  Diet effective now           Diet - low sodium heart healthy           Diet - low sodium heart healthy                EDUCATION NEEDS:   Education needs have been addressed  Skin:  Skin Assessment: Reviewed RN Assessment  Last BM:  3/6  Height:   Ht Readings from Last 1 Encounters:  08/05/20 5' 6"  (1.676 m)    Weight:   Wt Readings from Last 1 Encounters:  08/05/20 70.3 kg    Ideal Body Weight:  64.5 kg  BMI:  Body mass index is 25.02 kg/m.  Estimated Nutritional Needs:   Kcal:  1900-2200kcal/day  Protein:  95-110g/day  Fluid:  1.8-2.1L/day  Koleen Distance MS, RD, LDN Please refer to [facility] for RD and/or RD on-call/weekend/after hours pager

## 2020-08-06 NOTE — Discharge Summary (Signed)
Darren Rogers GXQ:[id number] DOB: 13-Feb-1956 DOA: 08/05/2020  PCP: Patient, No Pcp Per  Admit date: 08/05/2020 Discharge date: 08/06/2020  Time spent: 35 minutes  Recommendations for Outpatient Follow-up:  1. Cardiology f/u 1-2 weeks, repeat bmp then    Discharge Diagnoses:  Principal Problem:   Chest pain Active Problems:   Atrial fibrillation with rapid ventricular response (HCC)   CAD in native artery   PAD (peripheral artery disease) (HCC)   Essential hypertension   Rapid atrial fibrillation (HCC)   Alcohol use disorder, moderate, dependence (HCC)   Cocaine abuse (HCC)   Atrial fibrillation with RVR (HCC)   Discharge Condition: stable  Diet recommendation: heart healthy  Filed Weights   08/05/20 0359  Weight: 70.3 kg    History of present illness:   HPI: Darren Rogers is a 65 y.o. male with medical history significant for atrial fibrillation on metoprolol and flecainide, who stopped Xarelto on his own due to nosebleeds, who also has history of CAD, PAD, HTN, alcohol use disorder and nicotine use and who admits to overall inconsistent compliance with meds who presents to the emergency room with sudden onset retrosternal chest pain/pressure that started as he was preparing to go to bed.  The pain was nonradiating.  Associated with palpitations, shortness of breath and lightheadedness.  Had no associated nausea, vomiting or diaphoresis.  Denies cough, fever or chills.  Patient states he had been at his sister's house helping her following a fall for the past 4 days and did not have his medication with him.  EMS reported heart rate in the 200s for which she was administered 5 mg IV metoprolol and 4 baby aspirin ED Course: Upon arrival, patient was in A. fib at 159, BP 178/90 respirations 22 with O2 sat 100% on room air.  Temp 98.4.  First troponin negative at 16, BNP 93.2.   Patient was given a diltiazem bolus and started on diltiazem infusion with improvement in heart rate to the 1  teens and similarly had improvement in chest pain.  Also received nitroglycerin .  Hospitalist consulted for admission.   Hospital Course:  # Chest pain # CAD Presented w/ crushing chest pain, non-exertional, and palpitations. Call resolved. RVR resolved.  Troponin peaked at 100, myoview performed, formal read pending but cardiology reviewed, appears to be low risk. CXR relatively clear, no hypoxia. bnp wnl. Received asa. Mild ascvd on 2015 cath, has mult risk factors. - cont aspirin and statin  # A fib with rvr 2/2 alcohol use, medication non-compliance. Has declined anticoagulation in the past. HR normalized - discharge on diltiazem and metoprolol, hold flecainide - pt agreeable to starting xarelto, has refused in the past - close cardiology f/u  # Hypokalemia Resolved w/ supplementation - monitor  # PAD - cont asa, statin  # HTN Here bp wnl  - dilt/metop as above  # Alcohol use disorder History of withdrawal symptoms, denies hx complicated withdrawal. No symptoms of withdrawal currently. - the root problem here, needs to be addressed  # Cocaine use disorder Certainly contributes  # Nicotine dependence Smoking cessation counseled   Procedures:  none   Consultations:  cardiology  Discharge Exam: Vitals:   08/06/20 0531 08/06/20 1120  BP:  113/84  Pulse: 69 94  Resp:  14  Temp:  98.1 F (36.7 C)  SpO2:  99%    General exam: Appears calm and comfortable  Respiratory system: Clear to auscultation. Respiratory effort normal. Cardiovascular system: S1 & S2 heard, RRR. No JVD,  murmurs, rubs, gallops or clicks. No pedal edema. Gastrointestinal system: Abdomen is nondistended, soft and nontender. No organomegaly or masses felt. Normal bowel sounds heard. Central nervous system: Alert and oriented. No focal neurological deficits. Extremities: Symmetric 5 x 5 power. Skin: No rashes, lesions or ulcers Psychiatry: Judgement and insight appear normal. Mood  & affect appropriate.  Discharge Instructions   Discharge Instructions    Amb referral to [facility]   Complete by: As directed    Diet - low sodium heart healthy   Complete by: As directed    Diet - low sodium heart healthy   Complete by: As directed    Increase activity slowly   Complete by: As directed    Increase activity slowly   Complete by: As directed      Allergies as of 08/06/2020   No Known Allergies     Medication List    STOP taking these medications   flecainide 100 MG tablet Commonly known as: TAMBOCOR   metoprolol tartrate 100 MG tablet Commonly known as: LOPRESSOR   metoprolol tartrate 25 MG tablet Commonly known as: LOPRESSOR     TAKE these medications   aspirin EC 81 MG tablet Take 1 tablet (81 mg total) by mouth daily.   diltiazem 240 MG 24 hr capsule Commonly known as: CARDIZEM CD Take 1 capsule (240 mg total) by mouth daily.   metoprolol succinate 100 MG 24 hr tablet Commonly known as: TOPROL-XL Take 1 tablet (100 mg total) by mouth daily. Take with or immediately following a meal.   rivaroxaban 20 MG Tabs tablet Commonly known as: XARELTO Take 1 tablet (20 mg total) by mouth daily with supper. What changed: You were already taking a medication with the same name, and this prescription was added. Make sure you understand how and when to take each.   rivaroxaban 20 MG Tabs tablet Commonly known as: XARELTO Take 1 tablet (20 mg total) by mouth daily with supper for 14 days. What changed: Another medication with the same name was added. Make sure you understand how and when to take each.   rosuvastatin 20 MG tablet Commonly known as: Crestor Take 1 tablet (20 mg total) by mouth daily for 14 days.      No Known Allergies  Follow-up Information    Iran Ouch, MD. Schedule an appointment as soon as possible for a visit.   Specialty: Cardiology Contact information: [address] [place]  [address] [phone]                The results of significant diagnostics from this hospitalization (including imaging, microbiology, ancillary and laboratory) are listed below for reference.    Significant Diagnostic Studies: NM Myocar Multi W/Spect W/Wall Motion / EF  Result Date: 08/06/2020  Normal pharmacologic myocardial perfusion stress test without evidence of significant ischemia or scar.  Grossly normal left ventricular systolic function by visual estimation. Calculated LVEF is unreliable due to gating issues related to atrial fibrillation.  Coronary artery calcification and aortic atherosclerosis are noted on the attenuation correction CT.  This is a low risk study.    DG Chest Portable 1 View  Result Date: 08/05/2020 CLINICAL DATA:  Chest pain and shortness of breath EXAM: PORTABLE CHEST 1 VIEW COMPARISON:  05/29/2020 FINDINGS: Interstitial prominence when compared to before but no Kerley lines or effusion. Normal heart size for technique with stable aortic and hilar contours. No visible effusion or pneumothorax. IMPRESSION: Borderline vascular congestion. Electronically Signed  By: Marnee Spring M.D.   On: 08/05/2020 04:33   ECHOCARDIOGRAM COMPLETE  Result Date: 08/06/2020    ECHOCARDIOGRAM REPORT   Patient Name:   Darren Rogers Date of Exam: 08/06/2020 Medical Rec #:  [id number]    Height:       66.0 in Accession #:    [id number]   Weight:       155.0 lb Date of Birth:  1955/10/28   BSA:          1.794 m Patient Age:    64 years     BP:           105/68 mmHg Patient Gender: M            HR:           71 bpm. Exam Location:  [facility] Procedure: 2D Echo, Color Doppler, Cardiac Doppler and Strain Analysis Indications:     I48.91 Atrial fibrillation  History:         Patient has prior history of Echocardiogram examinations.                  Non-obstructive CAD; Risk Factors:Current Smoker, Hypertension                  and Dyslipidemia.  Sonographer:     Humphrey Rolls RDCS (AE) Referring  Phys:  [id number] Sondra Barges Diagnosing Phys: Yvonne Kendall MD  Sonographer Comments: Global longitudinal strain was attempted. IMPRESSIONS  1. Left ventricular ejection fraction, by estimation, is 50 to 55%. The left ventricle has low normal function. The left ventricle has no regional wall motion abnormalities. There is mild left ventricular hypertrophy. Left ventricular diastolic parameters are indeterminate. The average left ventricular global longitudinal strain is -8.7 %. The global longitudinal strain is abnormal.  2. Right ventricular systolic function is mildly reduced. The right ventricular size is normal. Moderately increased right ventricular wall thickness.  3. Left atrial size was mildly dilated.  4. The mitral valve is normal in structure. Mild mitral valve regurgitation. No evidence of mitral stenosis.  5. The aortic valve is tricuspid. There is mild thickening of the aortic valve. Aortic valve regurgitation is mild. Mild aortic valve sclerosis is present, with no evidence of aortic valve stenosis.  6. The inferior vena cava is normal in size with greater than 50% respiratory variability, suggesting right atrial pressure of 3 mmHg. FINDINGS  Left Ventricle: Left ventricular ejection fraction, by estimation, is 50 to 55%. The left ventricle has low normal function. The left ventricle has no regional wall motion abnormalities. The average left ventricular global longitudinal strain is -8.7 %.  The global longitudinal strain is abnormal. The left ventricular internal cavity size was normal in size. There is mild left ventricular hypertrophy. Left ventricular diastolic parameters are indeterminate. Right Ventricle: The right ventricular size is normal. Moderately increased right ventricular wall thickness. Right ventricular systolic function is mildly reduced. Left Atrium: Left atrial size was mildly dilated. Right Atrium: Right atrial size was normal in size. Pericardium: There is no evidence of  pericardial effusion. Mitral Valve: The mitral valve is normal in structure. Mild mitral valve regurgitation. No evidence of mitral valve stenosis. MV peak gradient, 4.3 mmHg. The mean mitral valve gradient is 2.0 mmHg. Tricuspid Valve: The tricuspid valve is normal in structure. Tricuspid valve regurgitation is not demonstrated. Aortic Valve: The aortic valve is tricuspid. There is mild thickening of the aortic valve. Aortic valve regurgitation is mild. Mild aortic valve sclerosis is present,  with no evidence of aortic valve stenosis. Aortic valve mean gradient measures 3.0 mmHg. Aortic valve peak gradient measures 5.1 mmHg. Aortic valve area, by VTI measures 1.82 cm. Pulmonic Valve: The pulmonic valve was normal in structure. Pulmonic valve regurgitation is mild. No evidence of pulmonic stenosis. Aorta: The aortic root is normal in size and structure. Pulmonary Artery: The pulmonary artery is of normal size. Venous: The inferior vena cava is normal in size with greater than 50% respiratory variability, suggesting right atrial pressure of 3 mmHg. IAS/Shunts: The interatrial septum was not well visualized.  LEFT VENTRICLE PLAX 2D LVIDd:         4.40 cm LVIDs:         3.30 cm     2D Longitudinal Strain LV PW:         1.20 cm     2D Strain GLS Avg:     -8.7 % LV IVS:        1.33 cm LVOT diam:     2.00 cm LV SV:         37 LV SV Index:   21 LVOT Area:     3.14 cm  LV Volumes (MOD) LV vol d, MOD A2C: 72.5 ml LV vol d, MOD A4C: 92.4 ml LV vol s, MOD A2C: 33.9 ml LV vol s, MOD A4C: 42.4 ml LV SV MOD A2C:     38.6 ml LV SV MOD A4C:     92.4 ml LV SV MOD BP:      44.0 ml RIGHT VENTRICLE RV Basal diam:  3.30 cm TAPSE (M-mode): 1.2 cm LEFT ATRIUM             Index       RIGHT ATRIUM           Index LA diam:        4.60 cm 2.56 cm/m  RA Area:     16.40 cm LA Vol (A2C):   61.2 ml 34.10 ml/m RA Volume:   42.80 ml  23.85 ml/m LA Vol (A4C):   54.2 ml 30.20 ml/m LA Biplane Vol: 57.7 ml 32.15 ml/m  AORTIC VALVE                    PULMONIC VALVE AV Area (Vmax):    2.12 cm    PV Vmax:       1.09 m/s AV Area (Vmean):   1.98 cm    PV Vmean:      72.800 cm/s AV Area (VTI):     1.82 cm    PV VTI:        0.194 m AV Vmax:           113.00 cm/s PV Peak grad:  4.8 mmHg AV Vmean:          81.700 cm/s PV Mean grad:  2.0 mmHg AV VTI:            0.205 m AV Peak Grad:      5.1 mmHg AV Mean Grad:      3.0 mmHg LVOT Vmax:         76.40 cm/s LVOT Vmean:        51.500 cm/s LVOT VTI:          0.119 m LVOT/AV VTI ratio: 0.58  AORTA Ao Root diam: 3.60 cm MITRAL VALVE MV Area (PHT): 3.39 cm    SHUNTS MV Area VTI:   1.71 cm    Systemic VTI:  0.12 m  MV Peak grad:  4.3 mmHg    Systemic Diam: 2.00 cm MV Mean grad:  2.0 mmHg MV Vmax:       1.04 m/s MV Vmean:      60.6 cm/s MV Decel Time: 224 msec MV E velocity: 85.57 cm/s Yvonne Kendall MD Electronically signed by Yvonne Kendall MD Signature Date/Time: 08/06/2020/10:14:33 AM    Final     Microbiology: Recent Results (from the past 240 hour(s))  Resp Panel by RT-PCR (Flu A&B, Covid) Nasopharyngeal Swab     Status: None   Collection Time: 08/05/20  5:35 AM   Specimen: Nasopharyngeal Swab; Nasopharyngeal(NP) swabs in vial transport medium  Result Value Ref Range Status   SARS Coronavirus 2 by RT PCR NEGATIVE NEGATIVE Final    Comment: (NOTE) SARS-CoV-2 target nucleic acids are NOT DETECTED.  The SARS-CoV-2 RNA is generally detectable in upper respiratory specimens during the acute phase of infection. The lowest concentration of SARS-CoV-2 viral copies this assay can detect is 138 copies/mL. A negative result does not preclude SARS-Cov-2 infection and should not be used as the sole basis for treatment or other patient management decisions. A negative result may occur with  improper specimen collection/handling, submission of specimen other than nasopharyngeal swab, presence of viral mutation(s) within the areas targeted by this assay, and inadequate number of viral copies(<138 copies/mL). A  negative result must be combined with clinical observations, patient history, and epidemiological information. The expected result is Negative.  Fact Sheet for Patients:  BloggerCourse.com  Fact Sheet for Healthcare Providers:  SeriousBroker.it  This test is no t yet approved or cleared by the [place] FDA and  has been authorized for detection and/or diagnosis of SARS-CoV-2 by FDA under an Emergency Use Authorization (EUA). This EUA will remain  in effect (meaning this test can be used) for the duration of the COVID-19 declaration under Section 564(b)(1) of the Act, 21 U.S.C.section 360bbb-3(b)(1), unless the authorization is terminated  or revoked sooner.       Influenza A by PCR NEGATIVE NEGATIVE Final   Influenza B by PCR NEGATIVE NEGATIVE Final    Comment: (NOTE) The Xpert Xpress SARS-CoV-2/FLU/RSV plus assay is intended as an aid in the diagnosis of influenza from Nasopharyngeal swab specimens and should not be used as a sole basis for treatment. Nasal washings and aspirates are unacceptable for Xpert Xpress SARS-CoV-2/FLU/RSV testing.  Fact Sheet for Patients: BloggerCourse.com  Fact Sheet for Healthcare Providers: SeriousBroker.it  This test is not yet approved or cleared by the [place] FDA and has been authorized for detection and/or diagnosis of SARS-CoV-2 by FDA under an Emergency Use Authorization (EUA). This EUA will remain in effect (meaning this test can be used) for the duration of the COVID-19 declaration under Section 564(b)(1) of the Act, 21 U.S.C. section 360bbb-3(b)(1), unless the authorization is terminated or revoked.  Performed at [facility], [address] [place], [place] [address]      Labs: Basic Metabolic Panel: Recent Labs  Lab 08/05/20 0420 08/05/20 0621 08/06/20 0446  NA 141  --  138  K 3.2*  --  4.0  CL 110   --  104  CO2 20*  --  26  GLUCOSE 96  --  114*  BUN 8  --  12  CREATININE 0.85  --  0.85  CALCIUM 8.9  --  9.4  MG  --  1.9 2.3  PHOS  --  3.4  --    Liver Function Tests: No results  for input(s): AST, ALT, ALKPHOS, BILITOT, PROT, ALBUMIN in the last 168 hours. No results for input(s): LIPASE, AMYLASE in the last 168 hours. No results for input(s): AMMONIA in the last 168 hours. CBC: Recent Labs  Lab 08/05/20 0420  WBC 5.7  NEUTROABS 2.5  HGB 13.0  HCT 37.8*  MCV 92.9  PLT 281   Cardiac Enzymes: No results for input(s): CKTOTAL, CKMB, CKMBINDEX, TROPONINI in the last 168 hours. BNP: BNP (last 3 results) Recent Labs    08/05/20 0420  BNP 93.2    ProBNP (last 3 results) No results for input(s): PROBNP in the last 8760 hours.  CBG: No results for input(s): GLUCAP in the last 168 hours.     Signed:  Silvano BilisNoah B Catheline Hixon MD.  [facility] 08/06/2020, 2:59 PM

## 2020-08-06 NOTE — Progress Notes (Signed)
Discharge instructions reviewed with patient. All questions answered at this time. Patient's vitals WDL at time of discharge ano no complaints of pain.

## 2020-08-06 NOTE — Care Management Obs Status (Signed)
MEDICARE OBSERVATION STATUS NOTIFICATION   Patient Details  Name: Neven Fina MRN: [id number] Date of Birth: [birth year]   Medicare Observation Status Notification Given:  Yes    Hetty Ely, RN 08/06/2020, 3:56 PM

## 2020-08-06 NOTE — Discharge Instructions (Signed)

## 2020-08-06 NOTE — Care Management CC44 (Signed)
Condition Code 44 Documentation Completed  Patient Details  Name: Layken Doenges MRN: [id number] Date of Birth: [birth year]   Condition Code 44 given:  Yes Patient signature on Condition Code 44 notice:  Yes Documentation of 2 MD's agreement:  Yes Code 44 added to claim:  Yes    Hetty Ely, RN 08/06/2020, 3:56 PM

## 2020-08-06 NOTE — Progress Notes (Signed)
PROGRESS NOTE    Darren Rogers  VOJ:[id number] DOB: [birth year] DOA: 08/05/2020 PCP: Patient, No Pcp Per  Outpatient Specialists: [facility] dr. Kirke Corin    Brief Narrative:   [name] is a 65 y.o. male with medical history significant for atrial fibrillation on metoprolol and flecainide, who stopped Xarelto on his own due to nosebleeds, who also has history of CAD, PAD, HTN, alcohol use disorder and nicotine use and who admits to overall inconsistent compliance with meds who presents to the emergency room with sudden onset retrosternal chest pain/pressure that started as he was preparing to go to bed.  The pain was nonradiating.  Associated with palpitations, shortness of breath and lightheadedness.  Had no associated nausea, vomiting or diaphoresis.  Denies cough, fever or chills.  Patient states he had been at his sister's house helping her following a fall for the past 4 days and did not have his medication with him.  EMS reported heart rate in the 200s for which she was administered 5 mg IV metoprolol and 4 baby aspirin   Assessment & Plan:   Principal Problem:   Chest pain Active Problems:   Atrial fibrillation with rapid ventricular response (HCC)   CAD in native artery   PAD (peripheral artery disease) (HCC)   Essential hypertension   Rapid atrial fibrillation (HCC)   Alcohol use disorder, moderate, dependence (HCC)   Cocaine abuse (HCC)  # Chest pain # CAD Presented w/ crushing chest pain, non-exertional, and palpitations. Call resolved. RVR resolved.  Troponin peaked at 100. CXR relatively clear, no hypoxia. bnp wnl. Received asa. Mild ascvd on 2015 cath, has mult risk factors. - cont aspirin and statin, treatment of a fib as below - myoview completed this morning, read pending - maintain telemetry - cardiology consulted - cont heparin for now  # A fib with rvr 2/2 alcohol use, medication non-compliance. Has declined anticoagulation in the past. HR normalized -  continue po dilt/metop, consolidate prior to discharge - keep k above 4, mg above 2 - cont heparin for now - pt says he is agreeable to starting xarelto at discharge  # Hypokalemia Resolved w/ supplementation - monitor  # PAD - cont asa, statin  # HTN Here bp wnl  - dilt/metop as above  # Alcohol use disorder History of withdrawal symptoms, denies hx complicated withdrawal. No symptoms of withdrawal currently. - ciwa lorazepam - the root problem here, needs to be addressed  # Cocaine use disorder Certainly contributes - f/u UDS, hiv, hcv  # Nicotine dependence Declines patch       DVT prophylaxis: heparin IV Code Status: full Family Communication: none @ bedside  Level of care: Progressive Cardiac Status is: Inpatient  Remains inpatient appropriate because:Inpatient level of care appropriate due to severity of illness   Dispo: The patient is from: Home              Anticipated d/c is to: Home              Patient currently is not medically stable to d/c.   Difficult to place patient No        Consultants:  cardiology  Procedures: none  Antimicrobials:  none    Subjective: This morning chest pain resolved. Palpitations resolved. No sob or cough. No fever. Has appetite.  Objective: Vitals:   08/06/20 0346 08/06/20 0431 08/06/20 0531 08/06/20 1120  BP: 105/68 105/68  113/84  Pulse: 60 70 69 94  Resp: 18   14  Temp: 98.3 F (36.8 C)   98.1 F (36.7 C)  TempSrc: Oral   Oral  SpO2: 100%   99%  Weight:      Height:        Intake/Output Summary (Last 24 hours) at 08/06/2020 1242 Last data filed at 08/06/2020 0900 Gross per 24 hour  Intake 701.47 ml  Output 1600 ml  Net -898.53 ml   Filed Weights   08/05/20 0359  Weight: 70.3 kg    Examination:  General exam: Appears calm and comfortable  Respiratory system: Clear to auscultation. Respiratory effort normal. Cardiovascular system: S1 & S2 heard, RRR. No JVD, murmurs, rubs, gallops or  clicks. No pedal edema. Gastrointestinal system: Abdomen is nondistended, soft and nontender. No organomegaly or masses felt. Normal bowel sounds heard. Central nervous system: Alert and oriented. No focal neurological deficits. Extremities: Symmetric 5 x 5 power. Skin: No rashes, lesions or ulcers Psychiatry: Judgement and insight appear normal. Mood & affect appropriate.     Data Reviewed: I have personally reviewed following labs and imaging studies  CBC: Recent Labs  Lab 08/05/20 0420  WBC 5.7  NEUTROABS 2.5  HGB 13.0  HCT 37.8*  MCV 92.9  PLT 281   Basic Metabolic Panel: Recent Labs  Lab 08/05/20 0420 08/05/20 0621 08/06/20 0446  NA 141  --  138  K 3.2*  --  4.0  CL 110  --  104  CO2 20*  --  26  GLUCOSE 96  --  114*  BUN 8  --  12  CREATININE 0.85  --  0.85  CALCIUM 8.9  --  9.4  MG  --  1.9 2.3  PHOS  --  3.4  --    GFR: Estimated Creatinine Clearance: 78.2 mL/min (by C-G formula based on SCr of 0.85 mg/dL). Liver Function Tests: No results for input(s): AST, ALT, ALKPHOS, BILITOT, PROT, ALBUMIN in the last 168 hours. No results for input(s): LIPASE, AMYLASE in the last 168 hours. No results for input(s): AMMONIA in the last 168 hours. Coagulation Profile: No results for input(s): INR, PROTIME in the last 168 hours. Cardiac Enzymes: No results for input(s): CKTOTAL, CKMB, CKMBINDEX, TROPONINI in the last 168 hours. BNP (last 3 results) No results for input(s): PROBNP in the last 8760 hours. HbA1C: No results for input(s): HGBA1C in the last 72 hours. CBG: No results for input(s): GLUCAP in the last 168 hours. Lipid Profile: No results for input(s): CHOL, HDL, LDLCALC, TRIG, CHOLHDL, LDLDIRECT in the last 72 hours. Thyroid Function Tests: Recent Labs    08/05/20 0727  TSH 0.952   Anemia Panel: No results for input(s): VITAMINB12, FOLATE, FERRITIN, TIBC, IRON, RETICCTPCT in the last 72 hours. Urine analysis:    Component Value Date/Time    COLORURINE YELLOW (A) 11/16/2014 1255   APPEARANCEUR Clear 07/30/2016 1600   LABSPEC 1.021 11/16/2014 1255   LABSPEC 1.019 03/28/2014 1430   PHURINE 5.0 11/16/2014 1255   GLUCOSEU Negative 07/30/2016 1600   GLUCOSEU Negative 03/28/2014 1430   HGBUR NEGATIVE 11/16/2014 1255   BILIRUBINUR Negative 07/30/2016 1600   BILIRUBINUR Negative 03/28/2014 1430   KETONESUR NEGATIVE 11/16/2014 1255   PROTEINUR Negative 07/30/2016 1600   PROTEINUR NEGATIVE 11/16/2014 1255   NITRITE Negative 07/30/2016 1600   NITRITE NEGATIVE 11/16/2014 1255   LEUKOCYTESUR Negative 07/30/2016 1600   LEUKOCYTESUR Negative 03/28/2014 1430   Sepsis Labs: @LABRCNTIP (procalcitonin:4,lacticidven:4)  ) Recent Results (from the past 240 hour(s))  Resp Panel by RT-PCR (Flu A&B, Covid) Nasopharyngeal Swab  Status: None   Collection Time: 08/05/20  5:35 AM   Specimen: Nasopharyngeal Swab; Nasopharyngeal(NP) swabs in vial transport medium  Result Value Ref Range Status   SARS Coronavirus 2 by RT PCR NEGATIVE NEGATIVE Final    Comment: (NOTE) SARS-CoV-2 target nucleic acids are NOT DETECTED.  The SARS-CoV-2 RNA is generally detectable in upper respiratory specimens during the acute phase of infection. The lowest concentration of SARS-CoV-2 viral copies this assay can detect is 138 copies/mL. A negative result does not preclude SARS-Cov-2 infection and should not be used as the sole basis for treatment or other patient management decisions. A negative result may occur with  improper specimen collection/handling, submission of specimen other than nasopharyngeal swab, presence of viral mutation(s) within the areas targeted by this assay, and inadequate number of viral copies(<138 copies/mL). A negative result must be combined with clinical observations, patient history, and epidemiological information. The expected result is Negative.  Fact Sheet for Patients:  BloggerCourse.comhttps://www.fda.gov/media/152166/download  Fact Sheet  for Healthcare Providers:  SeriousBroker.ithttps://www.fda.gov/media/152162/download  This test is no t yet approved or cleared by the [place] FDA and  has been authorized for detection and/or diagnosis of SARS-CoV-2 by FDA under an Emergency Use Authorization (EUA). This EUA will remain  in effect (meaning this test can be used) for the duration of the COVID-19 declaration under Section 564(b)(1) of the Act, 21 U.S.C.section 360bbb-3(b)(1), unless the authorization is terminated  or revoked sooner.       Influenza A by PCR NEGATIVE NEGATIVE Final   Influenza B by PCR NEGATIVE NEGATIVE Final    Comment: (NOTE) The Xpert Xpress SARS-CoV-2/FLU/RSV plus assay is intended as an aid in the diagnosis of influenza from Nasopharyngeal swab specimens and should not be used as a sole basis for treatment. Nasal washings and aspirates are unacceptable for Xpert Xpress SARS-CoV-2/FLU/RSV testing.  Fact Sheet for Patients: BloggerCourse.comhttps://www.fda.gov/media/152166/download  Fact Sheet for Healthcare Providers: SeriousBroker.ithttps://www.fda.gov/media/152162/download  This test is not yet approved or cleared by the [place] FDA and has been authorized for detection and/or diagnosis of SARS-CoV-2 by FDA under an Emergency Use Authorization (EUA). This EUA will remain in effect (meaning this test can be used) for the duration of the COVID-19 declaration under Section 564(b)(1) of the Act, 21 U.S.C. section 360bbb-3(b)(1), unless the authorization is terminated or revoked.  Performed at [facility], [address] [place], [place] [address]          Radiology Studies: DG Chest Portable 1 View  Result Date: 08/05/2020 CLINICAL DATA:  Chest pain and shortness of breath EXAM: PORTABLE CHEST 1 VIEW COMPARISON:  05/29/2020 FINDINGS: Interstitial prominence when compared to before but no Kerley lines or effusion. Normal heart size for technique with stable aortic and hilar contours. No visible effusion or  pneumothorax. IMPRESSION: Borderline vascular congestion. Electronically Signed   By: Marnee SpringJonathon  Watts M.D.   On: 08/05/2020 04:33   ECHOCARDIOGRAM COMPLETE  Result Date: 08/06/2020    ECHOCARDIOGRAM REPORT   Patient Name:   Beverly GustJERRY Mateja Date of Exam: 08/06/2020 Medical Rec #:  [id number]    Height:       66.0 in Accession #:    [id number]   Weight:       155.0 lb Date of Birth:  [birth year]   BSA:          1.794 m Patient Age:    64 years     BP:           105/68 mmHg Patient Gender: M  HR:           71 bpm. Exam Location:  [facility] Procedure: 2D Echo, Color Doppler, Cardiac Doppler and Strain Analysis Indications:     I48.91 Atrial fibrillation  History:         Patient has prior history of Echocardiogram examinations.                  Non-obstructive CAD; Risk Factors:Current Smoker, Hypertension                  and Dyslipidemia.  Sonographer:     Humphrey Rolls RDCS (AE) Referring Phys:  [id number] Sondra Barges Diagnosing Phys: Yvonne Kendall MD  Sonographer Comments: Global longitudinal strain was attempted. IMPRESSIONS  1. Left ventricular ejection fraction, by estimation, is 50 to 55%. The left ventricle has low normal function. The left ventricle has no regional wall motion abnormalities. There is mild left ventricular hypertrophy. Left ventricular diastolic parameters are indeterminate. The average left ventricular global longitudinal strain is -8.7 %. The global longitudinal strain is abnormal.  2. Right ventricular systolic function is mildly reduced. The right ventricular size is normal. Moderately increased right ventricular wall thickness.  3. Left atrial size was mildly dilated.  4. The mitral valve is normal in structure. Mild mitral valve regurgitation. No evidence of mitral stenosis.  5. The aortic valve is tricuspid. There is mild thickening of the aortic valve. Aortic valve regurgitation is mild. Mild aortic valve sclerosis is present, with no evidence of aortic valve stenosis.  6. The inferior  vena cava is normal in size with greater than 50% respiratory variability, suggesting right atrial pressure of 3 mmHg. FINDINGS  Left Ventricle: Left ventricular ejection fraction, by estimation, is 50 to 55%. The left ventricle has low normal function. The left ventricle has no regional wall motion abnormalities. The average left ventricular global longitudinal strain is -8.7 %.  The global longitudinal strain is abnormal. The left ventricular internal cavity size was normal in size. There is mild left ventricular hypertrophy. Left ventricular diastolic parameters are indeterminate. Right Ventricle: The right ventricular size is normal. Moderately increased right ventricular wall thickness. Right ventricular systolic function is mildly reduced. Left Atrium: Left atrial size was mildly dilated. Right Atrium: Right atrial size was normal in size. Pericardium: There is no evidence of pericardial effusion. Mitral Valve: The mitral valve is normal in structure. Mild mitral valve regurgitation. No evidence of mitral valve stenosis. MV peak gradient, 4.3 mmHg. The mean mitral valve gradient is 2.0 mmHg. Tricuspid Valve: The tricuspid valve is normal in structure. Tricuspid valve regurgitation is not demonstrated. Aortic Valve: The aortic valve is tricuspid. There is mild thickening of the aortic valve. Aortic valve regurgitation is mild. Mild aortic valve sclerosis is present, with no evidence of aortic valve stenosis. Aortic valve mean gradient measures 3.0 mmHg. Aortic valve peak gradient measures 5.1 mmHg. Aortic valve area, by VTI measures 1.82 cm. Pulmonic Valve: The pulmonic valve was normal in structure. Pulmonic valve regurgitation is mild. No evidence of pulmonic stenosis. Aorta: The aortic root is normal in size and structure. Pulmonary Artery: The pulmonary artery is of normal size. Venous: The inferior vena cava is normal in size with greater than 50% respiratory variability, suggesting right atrial pressure  of 3 mmHg. IAS/Shunts: The interatrial septum was not well visualized.  LEFT VENTRICLE PLAX 2D LVIDd:         4.40 cm LVIDs:         3.30 cm  2D Longitudinal Strain LV PW:         1.20 cm     2D Strain GLS Avg:     -8.7 % LV IVS:        1.33 cm LVOT diam:     2.00 cm LV SV:         37 LV SV Index:   21 LVOT Area:     3.14 cm  LV Volumes (MOD) LV vol d, MOD A2C: 72.5 ml LV vol d, MOD A4C: 92.4 ml LV vol s, MOD A2C: 33.9 ml LV vol s, MOD A4C: 42.4 ml LV SV MOD A2C:     38.6 ml LV SV MOD A4C:     92.4 ml LV SV MOD BP:      44.0 ml RIGHT VENTRICLE RV Basal diam:  3.30 cm TAPSE (M-mode): 1.2 cm LEFT ATRIUM             Index       RIGHT ATRIUM           Index LA diam:        4.60 cm 2.56 cm/m  RA Area:     16.40 cm LA Vol (A2C):   61.2 ml 34.10 ml/m RA Volume:   42.80 ml  23.85 ml/m LA Vol (A4C):   54.2 ml 30.20 ml/m LA Biplane Vol: 57.7 ml 32.15 ml/m  AORTIC VALVE                   PULMONIC VALVE AV Area (Vmax):    2.12 cm    PV Vmax:       1.09 m/s AV Area (Vmean):   1.98 cm    PV Vmean:      72.800 cm/s AV Area (VTI):     1.82 cm    PV VTI:        0.194 m AV Vmax:           113.00 cm/s PV Peak grad:  4.8 mmHg AV Vmean:          81.700 cm/s PV Mean grad:  2.0 mmHg AV VTI:            0.205 m AV Peak Grad:      5.1 mmHg AV Mean Grad:      3.0 mmHg LVOT Vmax:         76.40 cm/s LVOT Vmean:        51.500 cm/s LVOT VTI:          0.119 m LVOT/AV VTI ratio: 0.58  AORTA Ao Root diam: 3.60 cm MITRAL VALVE MV Area (PHT): 3.39 cm    SHUNTS MV Area VTI:   1.71 cm    Systemic VTI:  0.12 m MV Peak grad:  4.3 mmHg    Systemic Diam: 2.00 cm MV Mean grad:  2.0 mmHg MV Vmax:       1.04 m/s MV Vmean:      60.6 cm/s MV Decel Time: 224 msec MV E velocity: 85.57 cm/s Yvonne Kendall MD Electronically signed by Yvonne Kendall MD Signature Date/Time: 08/06/2020/10:14:33 AM    Final         Scheduled Meds:  aspirin EC  81 mg Oral Daily   diltiazem  90 mg Oral Q6H   folic acid  1 mg Oral Daily   LORazepam  0-4 mg  Intravenous Q6H   Or   LORazepam  0-4 mg Oral Q6H   [START ON 08/07/2020] LORazepam  0-4 mg  Intravenous Q12H   Or   [START ON 08/07/2020] LORazepam  0-4 mg Oral Q12H   metoprolol tartrate  12.5 mg Oral Q6H   multivitamin with minerals  1 tablet Oral Daily   rosuvastatin  40 mg Oral Daily   thiamine  100 mg Oral Daily   Or   thiamine  100 mg Intravenous Daily   Continuous Infusions:  heparin 1,100 Units/hr (08/06/20 1155)     LOS: 1 day    Time spent: 30 min    Silvano Bilis, MD [facility]   If 7PM-7AM, please contact night-coverage [email or web address] Password [email or web address] 08/06/2020, 12:42 PM

## 2020-08-07 ENCOUNTER — Other Ambulatory Visit: Payer: Self-pay

## 2020-08-07 ENCOUNTER — Emergency Department
Admission: EM | Admit: 2020-08-07 | Discharge: 2020-08-07 | Disposition: A | Discharge status: Home / Self Care | Payer: Medicare Other | Attending: Emergency Medicine | Admitting: Emergency Medicine

## 2020-08-07 ENCOUNTER — Emergency Department: Payer: Medicare Other

## 2020-08-07 DIAGNOSIS — Z79899 Other long term (current) drug therapy: Secondary | ICD-10-CM | POA: Diagnosis not present

## 2020-08-07 DIAGNOSIS — I10 Essential (primary) hypertension: Secondary | ICD-10-CM | POA: Diagnosis not present

## 2020-08-07 DIAGNOSIS — Z7901 Long term (current) use of anticoagulants: Secondary | ICD-10-CM | POA: Diagnosis not present

## 2020-08-07 DIAGNOSIS — F17210 Nicotine dependence, cigarettes, uncomplicated: Secondary | ICD-10-CM | POA: Diagnosis not present

## 2020-08-07 DIAGNOSIS — I4891 Unspecified atrial fibrillation: Secondary | ICD-10-CM

## 2020-08-07 DIAGNOSIS — Z7982 Long term (current) use of aspirin: Secondary | ICD-10-CM | POA: Insufficient documentation

## 2020-08-07 DIAGNOSIS — I2510 Atherosclerotic heart disease of native coronary artery without angina pectoris: Secondary | ICD-10-CM | POA: Diagnosis not present

## 2020-08-07 DIAGNOSIS — R079 Chest pain, unspecified: Secondary | ICD-10-CM | POA: Diagnosis present

## 2020-08-07 LAB — BASIC METABOLIC PANEL
Anion gap: 9 (ref 5–15)
BUN: 11 mg/dL (ref 8–23)
CO2: 22 mmol/L (ref 22–32)
Calcium: 8.8 mg/dL — ABNORMAL LOW (ref 8.9–10.3)
Chloride: 107 mmol/L (ref 98–111)
Creatinine, Ser: 1.04 mg/dL (ref 0.61–1.24)
GFR, Estimated: >60 mL/min (ref >60)
Glucose, Bld: 98 mg/dL (ref 70–99)
Potassium: 4.5 mmol/L (ref 3.5–5.1)
Sodium: 138 mmol/L (ref 135–145)

## 2020-08-07 LAB — TROPONIN I (HIGH SENSITIVITY)
Troponin I (High Sensitivity): 24 ng/L — ABNORMAL HIGH (ref <18)
Troponin I (High Sensitivity): 30 ng/L — ABNORMAL HIGH (ref <18)

## 2020-08-07 LAB — CBC WITH DIFFERENTIAL/PLATELET
Abs Immature Granulocytes: 0.01 10*3/uL (ref 0.00–0.07)
Basophils Absolute: 0 10*3/uL (ref 0.0–0.1)
Basophils Relative: 1 %
Eosinophils Absolute: 0.1 10*3/uL (ref 0.0–0.5)
Eosinophils Relative: 1 %
HCT: 42.1 % (ref 39.0–52.0)
Hemoglobin: 14.9 g/dL (ref 13.0–17.0)
Immature Granulocytes: 0 %
Lymphocytes Relative: 39 %
Lymphs Abs: 1.8 10*3/uL (ref 0.7–4.0)
MCH: 32.6 pg (ref 26.0–34.0)
MCHC: 35.4 g/dL (ref 30.0–36.0)
MCV: 92.1 fL (ref 80.0–100.0)
Monocytes Absolute: 0.5 10*3/uL (ref 0.1–1.0)
Monocytes Relative: 11 %
Neutro Abs: 2.2 10*3/uL (ref 1.7–7.7)
Neutrophils Relative %: 48 %
Platelets: 316 10*3/uL (ref 150–400)
RBC: 4.57 MIL/uL (ref 4.22–5.81)
RDW: 14.6 % (ref 11.5–15.5)
WBC: 4.7 10*3/uL (ref 4.0–10.5)
nRBC: 0 % (ref 0.0–0.2)

## 2020-08-07 LAB — MAGNESIUM: Magnesium: 2 mg/dL (ref 1.7–2.4)

## 2020-08-07 MED ORDER — DILTIAZEM HCL ER COATED BEADS 240 MG PO CP24
240.0000 mg | ORAL_CAPSULE | Freq: Once | ORAL | Status: AC
  Administered 2020-08-07: 240 mg via ORAL
  Filled 2020-08-07: qty 1

## 2020-08-07 MED ORDER — METOPROLOL TARTRATE 5 MG/5ML IV SOLN
5.0000 mg | Freq: Once | INTRAVENOUS | Status: AC
  Administered 2020-08-07: 5 mg via INTRAVENOUS

## 2020-08-07 MED ORDER — METOPROLOL SUCCINATE ER 50 MG PO TB24
100.0000 mg | ORAL_TABLET | Freq: Once | ORAL | Status: AC
  Administered 2020-08-07: 100 mg via ORAL
  Filled 2020-08-07: qty 2

## 2020-08-07 MED ORDER — RIVAROXABAN 20 MG PO TABS
20.0000 mg | ORAL_TABLET | Freq: Once | ORAL | Status: AC
  Administered 2020-08-07: 20 mg via ORAL
  Filled 2020-08-07: qty 1

## 2020-08-07 MED ORDER — METOPROLOL TARTRATE 5 MG/5ML IV SOLN
INTRAVENOUS | Status: AC
  Filled 2020-08-07: qty 5

## 2020-08-07 MED ORDER — METOPROLOL TARTRATE 50 MG PO TABS
50.0000 mg | ORAL_TABLET | Freq: Once | ORAL | Status: AC
  Administered 2020-08-07: 50 mg via ORAL
  Filled 2020-08-07: qty 1

## 2020-08-07 NOTE — ED Notes (Signed)
Pt given meal tray and is using phone to find ride home.

## 2020-08-07 NOTE — ED Notes (Signed)
Called pharmacy to send diltiazem 240mg . They will send.  Provided crackers and PB and water for pt to eat and drink.

## 2020-08-07 NOTE — ED Notes (Signed)
Pt on phone with family for ride home. Pt states that EDP told him will be discharging soon.

## 2020-08-07 NOTE — ED Notes (Signed)
Cabinet was overridden for 1st dose 5mg  metoprolol. Spoke with CN on how to resolve error, told to chart not given in order to avoid charting another dose that was not actually given. 5mg  has been given so far. Cabinet had been overridden to obtain this dose prior to order being placed.

## 2020-08-07 NOTE — Discharge Instructions (Signed)
Like we talked about, it is going to be most important that you make it to the pharmacy tomorrow morning and pick up your prescriptions for Cardizem and metoprolol.  We gave you long-acting doses of these medications this afternoon that we will hold you over tonight until you need to take these medicines again tomorrow.  If you develop any worsening symptoms in the meantime, please return to the ED.

## 2020-08-07 NOTE — ED Provider Notes (Signed)
[facility] Emergency Department Provider Note   ____________________________________________   Event Date/Time   First MD Initiated Contact with Patient 08/07/20 1359     (approximate)  I have reviewed the triage vital signs and the nursing notes.   HISTORY  Chief Complaint Chest Pain and Tachycardia    HPI Darren Rogers is a 65 y.o. male with past medical history of hypertension, CAD, PAD, and atrial fibrillation on Xarelto who presents to the ED complaining of chest pain.  Patient reports that he was discharged from the hospital yesterday after being admitted for atrial fibrillation.  He was feeling well at the time of discharge but had sudden onset of pressure in his chest last night.  He states he took 324 mg aspirin and the pain went away just before he went to sleep.  He then was woken from sleep around noon this afternoon with similar pain in his chest.  He felt like his heart was racing at the time and symptoms were similar to when he has dealt with atrial fibrillation in the past.  He felt short of breath with onset of the symptoms.  EMS arrived and found patient to be in A. fib with RVR, he was subsequently given 5 mg of metoprolol and heart rate improved.  Patient now states that chest pain has also resolved.  He has not yet had a chance to pick up his medications for rate control or anticoagulation following discharge from the hospital.        Past Medical History:  Diagnosis Date  . Claudication (HCC)    a. 06/2015 ABI: R - 0.73, L - 0.73. 30-49% bilat SFA stenosis. 50-74% R Profunda stenosis; b. 01/2017 ABI: R 0.89, L 0.88, TBI R 0.65, L 1.0.  . Erectile dysfunction   . Essential hypertension   . History of echocardiogram    a. 03/29/2014: EF 55-60%, mild LVH, normal RVSP;  b. 05/2015 Echo: EF 60-65%, no rwma, triv AI, mild MR, mildly dil LA.  Marland Kitchen Hyperlipidemia   . Non-obstructive CAD    a. 01/27/2014 Cath: LM nl, LAD mild diff dz w/o obs, LCx no  sig obs - scattered 20-30% mLCx, RCA no obs dz, EF 70%; b. 06/2015 MV; EF 60%, no nischemia.  Marland Kitchen PAF (paroxysmal atrial fibrillation) (HCC)    a. Pt says Dx >65yrs ago w/ ? RFCA @ [facility];  b. Recurrent 12/2013;  b. Rx Flecainide and Xarelto-->Intermittent compliance.  Marland Kitchen PVC's (premature ventricular contractions)    a. 05/2017 Zio: 4000 PVCs in 48 hrs (2%). Brief run of SVT.  . Tobacco abuse    a. ongoing - 1 ppd.    Patient Active Problem List   Diagnosis Date Noted  . Atrial fibrillation with RVR (HCC) 08/06/2020  . Demand ischemia (HCC)   . Polysubstance abuse (HCC)   . Rapid atrial fibrillation (HCC) 08/05/2020  . Alcohol use disorder, moderate, dependence (HCC) 08/05/2020  . Cocaine abuse (HCC) 08/05/2020  . Alcohol abuse 10/20/2019  . Depression 10/20/2019  . A-fib (HCC) 01/15/2017  . Personal history of tobacco use, presenting hazards to health 08/17/2016  . BPH associated with nocturia 07/30/2016  . IFG (impaired fasting glucose) 07/30/2016  . Chest pain 08/22/2015  . Essential hypertension   . PAD (peripheral artery disease) (HCC)   . 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 CAD in native artery 04/11/2014  . Paroxysmal atrial fibrillation (HCC)   . Tobacco abuse   . Hyperlipidemia   . Atrial fibrillation  with rapid ventricular response (HCC) 01/27/2014    Past Surgical History:  Procedure Laterality Date  . CARDIAC CATHETERIZATION    . [facility]     [facility]  . LEFT HEART CATH Right 01/27/2014   Procedure: LEFT HEART CATH;  Surgeon: Micheline Chapman, MD;  Location: [facility] CATH LAB;  Service: Cardiovascular;  Laterality: Right;    Prior to Admission medications   Medication Sig Start Date End Date Taking? Authorizing Provider  aspirin EC 81 MG tablet Take 1 tablet (81 mg total) by mouth daily. 08/16/19   Emily Filbert, MD  diltiazem (CARDIZEM CD) 240 MG 24 hr capsule Take 1 capsule (240 mg total) by mouth daily. 08/06/20   Wouk, Wilfred Curtis, MD   metoprolol succinate (TOPROL-XL) 100 MG 24 hr tablet Take 1 tablet (100 mg total) by mouth daily. Take with or immediately following a meal. 08/06/20   Wouk, Wilfred Curtis, MD  rivaroxaban (XARELTO) 20 MG TABS tablet Take 1 tablet (20 mg total) by mouth daily with supper. 08/06/20   Wouk, Wilfred Curtis, MD  rivaroxaban (XARELTO) 20 MG TABS tablet Take 1 tablet (20 mg total) by mouth daily with supper for 14 days. 08/06/20 08/20/20  Wouk, Wilfred Curtis, MD  rosuvastatin (CRESTOR) 20 MG tablet Take 1 tablet (20 mg total) by mouth daily for 14 days. 01/29/20 02/12/20  Delton Prairie, MD    Allergies Patient has no known allergies.  Family History  Problem Relation Age of Onset  . Coronary artery disease Father 69  . Diabetes Mother   . Diabetes Sister   . Diabetes Brother   . Diabetes Maternal Grandmother   . Diabetes Sister   . Diabetes Sister     Social History Social History   Tobacco Use  . Smoking status: Current Every Day Smoker    Packs/day: 0.50    Years: 30.00    Pack years: 15.00    Types: Cigarettes  . Smokeless tobacco: Never Used  Substance Use Topics  . Alcohol use: Yes  . Drug use: Yes    Types: Cocaine    Review of Systems  Constitutional: No fever/chills Eyes: No visual changes. ENT: No sore throat. Cardiovascular: Positive for chest pain. Respiratory: Positive for shortness of breath. Gastrointestinal: No abdominal pain.  No nausea, no vomiting.  No diarrhea.  No constipation. Genitourinary: Negative for dysuria. Musculoskeletal: Negative for back pain. Skin: Negative for rash. Neurological: Negative for headaches, focal weakness or numbness.  ____________________________________________   PHYSICAL EXAM:  VITAL SIGNS: ED Triage Vitals [08/07/20 1400]  Enc Vitals Group     BP      Pulse      Resp      Temp      Temp src      SpO2      Weight 155 lb (70.3 kg)     Height 5\' 6"  (1.676 m)     Head Circumference      Peak Flow      Pain Score      Pain  Loc      Pain Edu?      Excl. in GC?     Constitutional: Alert and oriented. Eyes: Conjunctivae are normal. Head: Atraumatic. Nose: No congestion/rhinnorhea. Mouth/Throat: Mucous membranes are moist. Neck: Normal ROM Cardiovascular: Tachycardic, irregularly irregular rhythm. Grossly normal heart sounds.  2+ radial pulses bilaterally. Respiratory: Normal respiratory effort.  No retractions. Lungs CTAB. Gastrointestinal: Soft and nontender. No distention. Genitourinary: deferred Musculoskeletal: No lower extremity tenderness nor  edema. Neurologic:  Normal speech and language. No gross focal neurologic deficits are appreciated. Skin:  Skin is warm, dry and intact. No rash noted. Psychiatric: Mood and affect are normal. Speech and behavior are normal.  ____________________________________________   LABS (all labs ordered are listed, but only abnormal results are displayed)  Labs Reviewed  BASIC METABOLIC PANEL - Abnormal; Notable for the following components:      Result Value   Calcium 8.8 (*)    All other components within normal limits  TROPONIN I (HIGH SENSITIVITY) - Abnormal; Notable for the following components:   Troponin I (High Sensitivity) 24 (*)    All other components within normal limits  CBC WITH DIFFERENTIAL/PLATELET  MAGNESIUM  TROPONIN I (HIGH SENSITIVITY)   ____________________________________________  EKG  ED ECG REPORT I, Chesley Noon, the attending physician, personally viewed and interpreted this ECG.   Date: 08/07/2020  EKG Time: 13:58  Rate: 114  Rhythm: atrial fibrillation, rate 114  Axis: Normal  Intervals:none  ST&T Change: Lateral T wave inversions   PROCEDURES  Procedure(s) performed (including Critical Care):  Procedures   ____________________________________________   INITIAL IMPRESSION / ASSESSMENT AND PLAN / ED COURSE       65 year old male with past medical history of hypertension, atrial fibrillation on Xarelto, CAD,  and PAD who presents to the ED for onset of chest pain with palpitations since being discharged from the hospital yesterday.  Patient found to be in A. fib RVR with EMS, was given 5 mg metoprolol IV with improvement in his heart rate, although he remains in RVR with heart rate around the 120s.  We will give an additional 5 mg IV metoprolol to attempt or control his rate.  We will screen labs and chest x-ray.  EKG shows lateral T wave inversions that are similar to previous and I suspect his chest pain is due to recurrent atrial fibrillation.  Lab work is unremarkable, patient's heart rate is gradually improving following IV dose of metoprolol, we will give p.o. metoprolol in addition to this.  Chest x-ray reviewed by me and shows no infiltrate, edema, or effusion.  Patient turned over to oncoming provider pending reassessment for improvement of heart rate.  If rate is appropriately controlled, patient be appropriate for discharge home with close cardiology follow-up.    ____________________________________________   FINAL CLINICAL IMPRESSION(S) / ED DIAGNOSES  Final diagnoses:  Atrial fibrillation with rapid ventricular response [facility])     ED Discharge Orders    None       Note:  This document was prepared using Dragon voice recognition software and may include unintentional dictation errors.   Chesley Noon, MD 08/07/20 [phone]

## 2020-08-07 NOTE — ED Triage Notes (Signed)
Pt to ED via [facility] from home c/o CP that began today at 12pm, was 8/10 "sharp and heavy" and was woken up at this time with CP. Pt had SOB. Pt now denies CP. Pt was released from [facility] yesterday, had been seen for a fib AVR. Pt had nolt obtained prescription yet for diltiazem. Pt took 324mg  aspirin this morning and was given 5mg  metoptolol IVP at 1335 with EMS on way to hospital. Per EMS, 12 lead EKG showed a fib RVR with HR 101-184, then 90-121 after metoprolol. BP was 137/95, CBG 167. Pt in NAD at this time. 12 lead EKG being done and EDP evaluating pt.

## 2020-08-07 NOTE — ED Provider Notes (Signed)
  Clinical Course as of 08/07/20 1840  Wed Aug 07, 2020  1534 Patient received in signout from Dr. Larinda Buttery pending medications and rate control of A. fib.  He was discharged yesterday afternoon for A. fib with RVR and had medication adjustments.  I reassessed the patient and he has a heart rate 110-120 but reports improved chest pain.  We discussed his home medications and he reports that due to financial difficulties he would be unable to fill his medications today, but reports you can go to the pharmacy tomorrow morning to get his prescriptions.  We discussed providing his long-acting home medications here in the ED, and if we have good rate control, he would be suitable for outpatient management to get his home meds tomorrow. [DS]  1730 HR 90s-100s. He continues to look well. Denies CP [DS]  1836 Reassessed.  Rates remain 90s-100.  He is requesting discharge.  He reports that he will be able to pick up his prescriptions tomorrow and his youngest daughter will drive him.  We discussed the importance of maintaining his rate control medications. [DS]    Clinical Course User Index [DS] Delton Prairie, MD      Delton Prairie, MD 08/07/20 [phone]

## 2020-08-09 ENCOUNTER — Encounter: Payer: Self-pay | Admitting: Nurse Practitioner

## 2020-08-09 ENCOUNTER — Ambulatory Visit: Payer: Medicare Other | Admitting: Nurse Practitioner

## 2020-08-09 NOTE — Progress Notes (Deleted)
Office Visit    Patient Name: Darren Rogers Date of Encounter: 08/09/2020  Primary Care Provider:  Patient, No Pcp Per Primary Cardiologist:  Lorine Bears, MD  Chief Complaint    Darren Rogers is a 65 y/o male with history of HTN, hyperlipidemia, non-obstructive CAD, PAF, polysubstance abuse, and PAD who presents today for follow-up of his A fib w/RVR and chest pain.   Past Medical History    Past Medical History:  Diagnosis Date  . Claudication (HCC)    a. 06/2015 ABI: R - 0.73, L - 0.73. 30-49% bilat SFA stenosis. 50-74% R Profunda stenosis; b. 01/2017 ABI: R 0.89, L 0.88, TBI R 0.65, L 1.0.  . Erectile dysfunction   . Essential hypertension   . History of echocardiogram    a. 03/29/2014: EF 55-60%, mild LVH, normal RVSP;  b. 05/2015 Echo: EF 60-65%, no rwma, triv AI, mild MR, mildly dil LA.  Marland Kitchen Hyperlipidemia   . Non-obstructive CAD    a. 01/27/2014 Cath: LM nl, LAD mild diff dz w/o obs, LCx no sig obs - scattered 20-30% mLCx, RCA no obs dz, EF 70%; b. 06/2015 MV; EF 60%, no nischemia.  Marland Kitchen PAF (paroxysmal atrial fibrillation) (HCC)    a. Pt says Dx >5yrs ago w/ ? RFCA @ [facility];  b. Recurrent 12/2013;  b. Rx Flecainide and Xarelto-->Intermittent compliance.  Marland Kitchen PVC's (premature ventricular contractions)    a. 05/2017 Zio: 4000 PVCs in 48 hrs (2%). Brief run of SVT.  . Tobacco abuse    a. ongoing - 1 ppd.   Past Surgical History:  Procedure Laterality Date  . CARDIAC CATHETERIZATION    . CARD[facility] CATHETERIZATION     [facility]  . LEFT HEART CATH Right 01/27/2014   Procedure: LEFT HEART CATH;  Surgeon: Micheline Chapman, MD;  Location: [facility] CATH LAB;  Service: Cardiovascular;  Laterality: Right;    Allergies  No Known Allergies  History of Present Illness    Darren Rogers is a 65 y/o male with history of HTN, hyperlipidemia, non-obstructive CAD, PAF, polysubstance abuse, PAD, who presents today for follow-up of his A fib w/RVR and chest pain. He was diagnosed with a fib more than 20  years ago and has been intermittent with his medication compliance. He has not been seen in the office since 2019. He had diagnostic cath in 2015 which showed nonobstructive disease. He also has nonobstructive PAD with stable mild claudication with known mildly reduced ABI. Echo in 05/2015 showed an EF of 60 to 65%, normal wall motion, normal LV diastolic function, trivial AI, mild MR, and a mildly dilated left atrium.  He had recurrent chest pain and Afib in early 2017 with nonischemic Myoview at that time. In 12/2016, he was admitted to [facility] in the setting of noncompliance with his flecainide and recurrent Afib. Flecainide, metoprolol, and Xarelto were reinitiated and he converted to sinus rhythm. It was advised that he undergo stress testing though he deferred this and was a no-show for multiple outpatient Myoviews. Zio monitor in 04/2017, showed NSR, no evidence of Afib, 1 short run of SVT lasting 4 beats with a maximal heart rate of 146 bpm, occasional PVCs with a total of 4,000 beats in 48 hours, representing 2% burden. He has had frequent ED visits for various complaints including chest pain, palpitations, and polysubstance abuse. His cardiac work-ups have been unrevealing w/nl troponin.   He recently had 2 admissions to [facility] ED one day apart for a fib with RVR. On  the first admission, he was found to be in A fib with RVR w/vent rates in 140s -160s. Troponin trended up to 77. He has recurrent chest pain in the setting of A fib with RVR. He reported noncompliance with Xarelto due to nose bleeds. He converted during the initial hospitalization on IV diltiazem and was discharged on po metoprolol, diltiazem, and Xarelto and was advised close follow-up with cards. Repeat echo showed LVEF 50-55%, mild LVH, normal wall motion, mildly dilated left atrium, mild MR, mild AI, moderately increased RV wall thickness. The following day, he was readmitted to the ED for chest pain that occurred in conjunction with heart  racing and SOB. EMS arrived and found him to be in A fib w/RVR. He as given 5 mg IV metoprolol and his HR improved and chest pain resolved. He was discharged home from ED w/improvement in symptoms and HR 90s to 100s. He promised to pick up his prescriptions for metoprolol, diltiazem & Xarelto the following day.   Today,   Home Medications    Prior to Admission medications   Medication Sig Start Date End Date Taking? Authorizing Provider  aspirin EC 81 MG tablet Take 1 tablet (81 mg total) by mouth daily. 08/16/19   Emily Filbert, MD  diltiazem (CARDIZEM CD) 240 MG 24 hr capsule Take 1 capsule (240 mg total) by mouth daily. 08/06/20   Wouk, Wilfred Curtis, MD  metoprolol succinate (TOPROL-XL) 100 MG 24 hr tablet Take 1 tablet (100 mg total) by mouth daily. Take with or immediately following a meal. 08/06/20   Wouk, Wilfred Curtis, MD  rivaroxaban (XARELTO) 20 MG TABS tablet Take 1 tablet (20 mg total) by mouth daily with supper. 08/06/20   Wouk, Wilfred Curtis, MD  rivaroxaban (XARELTO) 20 MG TABS tablet Take 1 tablet (20 mg total) by mouth daily with supper for 14 days. 08/06/20 08/20/20  Wouk, Wilfred Curtis, MD  rosuvastatin (CRESTOR) 20 MG tablet Take 1 tablet (20 mg total) by mouth daily for 14 days. 01/29/20 02/12/20  Delton Prairie, MD    Review of Systems    ***.  All other systems reviewed and are otherwise negative except as noted above.  Physical Exam    VS:  There were no vitals taken for this visit. , BMI There is no height or weight on file to calculate BMI. GEN: Well nourished, well developed, in no acute distress. HEENT: normal. Neck: Supple, no JVD, carotid bruits, or masses. Cardiac: RRR, no murmurs, rubs, or gallops. No clubbing, cyanosis, edema.  Radials/DP/PT 2+ and equal bilaterally.  Respiratory:  Respirations regular and unlabored, clear to auscultation bilaterally. GI: Soft, nontender, nondistended, BS + x 4. MS: no deformity or atrophy. Skin: warm and dry, no rash. Neuro:   Strength and sensation are intact. Psych: Normal affect.  Accessory Clinical Findings    ECG personally reviewed by me today - *** - no acute changes.  Lab Results  Component Value Date   WBC 4.7 08/07/2020   HGB 14.9 08/07/2020   HCT 42.1 08/07/2020   MCV 92.1 08/07/2020   PLT 316 08/07/2020   Lab Results  Component Value Date   CREATININE 1.04 08/07/2020   BUN 11 08/07/2020   NA 138 08/07/2020   K 4.5 08/07/2020   CL 107 08/07/2020   CO2 22 08/07/2020   Lab Results  Component Value Date   ALT 42 01/28/2020   AST 50 (H) 01/28/2020   ALKPHOS 75 01/28/2020   BILITOT 0.6 01/28/2020  Lab Results  Component Value Date   CHOL 151 11/04/2017   HDL 42 11/04/2017   LDLCALC 86 11/04/2017   TRIG 115 11/04/2017   CHOLHDL 3.6 11/04/2017    Lab Results  Component Value Date   HGBA1C 6.1 07/30/2016    Assessment & Plan    1.  Atrial fibrillation w/RVR:  2. Chest pain:  3. Essential hypertension:  PAD:  Disposition   Nicolasa Ducking, NP 08/09/2020, 7:33 AM

## 2020-08-12 ENCOUNTER — Other Ambulatory Visit: Payer: Self-pay

## 2020-08-12 ENCOUNTER — Emergency Department
Admission: EM | Admit: 2020-08-12 | Discharge: 2020-08-12 | Disposition: A | Discharge status: Home / Self Care | Payer: Medicare Other | Attending: Emergency Medicine | Admitting: Emergency Medicine

## 2020-08-12 ENCOUNTER — Encounter: Payer: Self-pay | Admitting: Intensive Care

## 2020-08-12 ENCOUNTER — Emergency Department: Payer: Medicare Other

## 2020-08-12 DIAGNOSIS — R0602 Shortness of breath: Secondary | ICD-10-CM | POA: Diagnosis not present

## 2020-08-12 DIAGNOSIS — I10 Essential (primary) hypertension: Secondary | ICD-10-CM | POA: Insufficient documentation

## 2020-08-12 DIAGNOSIS — I4811 Longstanding persistent atrial fibrillation: Secondary | ICD-10-CM | POA: Diagnosis not present

## 2020-08-12 DIAGNOSIS — Z7982 Long term (current) use of aspirin: Secondary | ICD-10-CM | POA: Insufficient documentation

## 2020-08-12 DIAGNOSIS — Z79899 Other long term (current) drug therapy: Secondary | ICD-10-CM | POA: Diagnosis not present

## 2020-08-12 DIAGNOSIS — R0789 Other chest pain: Secondary | ICD-10-CM | POA: Diagnosis present

## 2020-08-12 DIAGNOSIS — I2510 Atherosclerotic heart disease of native coronary artery without angina pectoris: Secondary | ICD-10-CM | POA: Diagnosis not present

## 2020-08-12 DIAGNOSIS — F17210 Nicotine dependence, cigarettes, uncomplicated: Secondary | ICD-10-CM | POA: Insufficient documentation

## 2020-08-12 LAB — CBC
HCT: 38.4 % — ABNORMAL LOW (ref 39.0–52.0)
Hemoglobin: 13.4 g/dL (ref 13.0–17.0)
MCH: 32 pg (ref 26.0–34.0)
MCHC: 34.9 g/dL (ref 30.0–36.0)
MCV: 91.6 fL (ref 80.0–100.0)
Platelets: 327 10*3/uL (ref 150–400)
RBC: 4.19 MIL/uL — ABNORMAL LOW (ref 4.22–5.81)
RDW: 14.2 % (ref 11.5–15.5)
WBC: 5.2 10*3/uL (ref 4.0–10.5)
nRBC: 0 % (ref 0.0–0.2)

## 2020-08-12 LAB — COMPREHENSIVE METABOLIC PANEL
ALT: 29 U/L (ref 0–44)
AST: 22 U/L (ref 15–41)
Albumin: 3.4 g/dL — ABNORMAL LOW (ref 3.5–5.0)
Alkaline Phosphatase: 57 U/L (ref 38–126)
Anion gap: 6 (ref 5–15)
BUN: 16 mg/dL (ref 8–23)
CO2: 23 mmol/L (ref 22–32)
Calcium: 8 mg/dL — ABNORMAL LOW (ref 8.9–10.3)
Chloride: 107 mmol/L (ref 98–111)
Creatinine, Ser: 1.17 mg/dL (ref 0.61–1.24)
GFR, Estimated: >60 mL/min (ref >60)
Glucose, Bld: 107 mg/dL — ABNORMAL HIGH (ref 70–99)
Potassium: 3.4 mmol/L — ABNORMAL LOW (ref 3.5–5.1)
Sodium: 136 mmol/L (ref 135–145)
Total Bilirubin: 0.3 mg/dL (ref 0.3–1.2)
Total Protein: 6.4 g/dL — ABNORMAL LOW (ref 6.5–8.1)

## 2020-08-12 LAB — TROPONIN I (HIGH SENSITIVITY): Troponin I (High Sensitivity): 10 ng/L (ref <18)

## 2020-08-12 LAB — BRAIN NATRIURETIC PEPTIDE: B Natriuretic Peptide: 185.1 pg/mL — ABNORMAL HIGH (ref 0.0–100.0)

## 2020-08-12 NOTE — ED Provider Notes (Signed)
[facility] Emergency Department Provider Note   ____________________________________________    I have reviewed the triage vital signs and the nursing notes.   HISTORY  Chief Complaint Chest Pain     HPI Darren Rogers is a 65 y.o. male with a history of CAD, atrial fibrillation on Xarelto who presents with reports of chest pain shortness of breath.  Patient reports he woke up around 4 AM with chest discomfort described as pressure and mild shortness of breath.  He took his home medications, he reports that he did not take them yesterday because he was having constipation related to his medications.  He started to feel better after taking his home medications but not entirely improved so he called EMS.  EMS apparently discovered his heart rate was in the 120s and gave him IV metoprolol which he states has helped him feel better.  Currently he is chest pain-free, no shortness of breath  Past Medical History:  Diagnosis Date  . Claudication (HCC)    a. 06/2015 ABI: R - 0.73, L - 0.73. 30-49% bilat SFA stenosis. 50-74% R Profunda stenosis; b. 01/2017 ABI: R 0.89, L 0.88, TBI R 0.65, L 1.0.  . Erectile dysfunction   . Essential hypertension   . History of echocardiogram    a. 03/29/2014: EF 55-60%, mild LVH, normal RVSP;  b. 05/2015 Echo: EF 60-65%, triv AI, mild MR; c. 07/2020 Echo:   Marland Kitchen Hyperlipidemia   . Non-obstructive CAD    a. 01/27/2014 Cath: LM nl, LAD mild diff dz w/o obs, LCx no sig obs - scattered 20-30% mLCx, RCA no obs dz, EF 70%; b. 06/2015 MV; EF 60%, no ischemia.  Marland Kitchen PAF (paroxysmal atrial fibrillation) (HCC)    a. Pt says Dx >69yrs ago w/ ? RFCA @ [facility];  b. Recurrent 12/2013;  b. Rx Flecainide and Xarelto-->Intermittent compliance.  Marland Kitchen PVC's (premature ventricular contractions)    a. 05/2017 Zio: 4000 PVCs in 48 hrs (2%). Brief run of SVT.  . Tobacco abuse    a. ongoing - 1 ppd.    Patient Active Problem List   Diagnosis Date Noted  . Atrial  fibrillation with RVR (HCC) 08/06/2020  . Demand ischemia (HCC)   . Polysubstance abuse (HCC)   . Rapid atrial fibrillation (HCC) 08/05/2020  . Alcohol use disorder, moderate, dependence (HCC) 08/05/2020  . Cocaine abuse (HCC) 08/05/2020  . Alcohol abuse 10/20/2019  . Depression 10/20/2019  . A-fib (HCC) 01/15/2017  . Personal history of tobacco use, presenting hazards to health 08/17/2016  . BPH associated with nocturia 07/30/2016  . IFG (impaired fasting glucose) 07/30/2016  . Chest pain 08/22/2015  . Essential hypertension   . PAD (peripheral artery disease) (HCC)   . 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 CAD in native artery 04/11/2014  . Paroxysmal atrial fibrillation (HCC)   . Tobacco abuse   . Hyperlipidemia   . Atrial fibrillation with rapid ventricular response (HCC) 01/27/2014    Past Surgical History:  Procedure Laterality Date  . CARDIAC CATHETERIZATION    . CARDIAC CATHETERIZATION     [facility]  . LEFT HEART CATH Right 01/27/2014   Procedure: LEFT HEART CATH;  Surgeon: Micheline Chapman, MD;  Location: [facility] CATH LAB;  Service: Cardiovascular;  Laterality: Right;    Prior to Admission medications   Medication Sig Start Date End Date Taking? Authorizing Provider  aspirin EC 81 MG tablet Take 1 tablet (81 mg total) by mouth daily. 08/16/19   Daryel November  E, MD  diltiazem (CARDIZEM CD) 240 MG 24 hr capsule Take 1 capsule (240 mg total) by mouth daily. 08/06/20   Wouk, Wilfred Curtis, MD  metoprolol succinate (TOPROL-XL) 100 MG 24 hr tablet Take 1 tablet (100 mg total) by mouth daily. Take with or immediately following a meal. 08/06/20   Wouk, Wilfred Curtis, MD  rivaroxaban (XARELTO) 20 MG TABS tablet Take 1 tablet (20 mg total) by mouth daily with supper. 08/06/20   Wouk, Wilfred Curtis, MD  rivaroxaban (XARELTO) 20 MG TABS tablet Take 1 tablet (20 mg total) by mouth daily with supper for 14 days. 08/06/20 08/20/20  Wouk, Wilfred Curtis, MD  rosuvastatin (CRESTOR) 20 MG tablet  Take 1 tablet (20 mg total) by mouth daily for 14 days. 01/29/20 02/12/20  Delton Prairie, MD     Allergies Patient has no known allergies.  Family History  Problem Relation Age of Onset  . Coronary artery disease Father 67  . Diabetes Mother   . Diabetes Sister   . Diabetes Brother   . Diabetes Maternal Grandmother   . Diabetes Sister   . Diabetes Sister     Social History Social History   Tobacco Use  . Smoking status: Current Every Day Smoker    Packs/day: 0.50    Years: 30.00    Pack years: 15.00    Types: Cigarettes  . Smokeless tobacco: Never Used  Substance Use Topics  . Alcohol use: Yes    Alcohol/week: 12.0 standard drinks    Types: 12 Cans of beer per week  . Drug use: Yes    Types: Cocaine    Review of Systems  Constitutional: No fever/chills Eyes: No visual changes.  ENT: No sore throat. Cardiovascular: As above Respiratory: As above Gastrointestinal: No abdominal pain.  No nausea, no vomiting.   Genitourinary: Negative for dysuria. Musculoskeletal: Negative for back pain. Skin: Negative for rash. Neurological: Negative for headaches or weakness   ____________________________________________   PHYSICAL EXAM:  VITAL SIGNS: ED Triage Vitals  Enc Vitals Group     BP 08/12/20 0729 (!) 89/68     Pulse Rate 08/12/20 0729 (!) 103     Resp 08/12/20 0729 (!) 24     Temp 08/12/20 0729 97.9 F (36.6 C)     Temp Source 08/12/20 0729 Oral     SpO2 08/12/20 0729 99 %     Weight 08/12/20 0731 70.3 kg (155 lb)     Height 08/12/20 0731 1.676 m (5\' 6" )     Head Circumference --      Peak Flow --      Pain Score 08/12/20 0731 3     Pain Loc --      Pain Edu? --      Excl. in GC? --     Constitutional: Alert and oriented.   Nose: No congestion/rhinnorhea. Mouth/Throat: Mucous membranes are moist.    Cardiovascular: Normal rate, irregular rhythm grossly normal heart sounds.  Good peripheral circulation. Respiratory: Normal respiratory effort.  No  retractions. Lungs CTAB. Gastrointestinal: Soft and nontender. No distention.  No CVA tenderness.  Musculoskeletal: No lower extremity tenderness nor edema.  Warm and well perfused Neurologic:  Normal speech and language. No gross focal neurologic deficits are appreciated.  Skin:  Skin is warm, dry and intact. No rash noted. Psychiatric: Mood and affect are normal. Speech and behavior are normal.  ____________________________________________   LABS (all labs ordered are listed, but only abnormal results are displayed)  Labs Reviewed  CBC -  Abnormal; Notable for the following components:      Result Value   RBC 4.19 (*)    HCT 38.4 (*)    All other components within normal limits  COMPREHENSIVE METABOLIC PANEL - Abnormal; Notable for the following components:   Potassium 3.4 (*)    Glucose, Bld 107 (*)    Calcium 8.0 (*)    Total Protein 6.4 (*)    Albumin 3.4 (*)    All other components within normal limits  BRAIN NATRIURETIC PEPTIDE - Abnormal; Notable for the following components:   B Natriuretic Peptide 185.1 (*)    All other components within normal limits  TROPONIN I (HIGH SENSITIVITY)  TROPONIN I (HIGH SENSITIVITY)   ____________________________________________  EKG  ED ECG REPORT I, Jene Every, the attending physician, personally viewed and interpreted this ECG.  Date: 08/12/2020  Rhythm: Atrial fibrillation QRS Axis: normal Intervals: normal ST/T Wave abnormalities: T wave inversion inferior laterally, are chronic Narrative Interpretation: Not significantly changed from prior  ____________________________________________  RADIOLOGY  Chest x-ray viewed by me, no acute abnormalities ____________________________________________   PROCEDURES  Procedure(s) performed: No  .1-3 Lead EKG Interpretation Performed by: Jene Every, MD Authorized by: Jene Every, MD     ECG rate assessment: tachycardic     Rhythm: atrial fibrillation     Ectopy:  PVCs     Conduction: normal       Critical Care performed: No ____________________________________________   INITIAL IMPRESSION / ASSESSMENT AND PLAN / ED COURSE  Pertinent labs & imaging results that were available during my care of the patient were reviewed by me and considered in my medical decision making (see chart for details).  Patient presents with complaints of chest pain, shortness of breath that started approximately 4 AM, he is feeling much improved now and is asymptomatic.  Heart rate on my exam is in the 90s, and is in atrial fibrillation  Suspect patient had atrial fibrillation with RVR this morning which caused his symptoms, administration of metoprolol improved his heart rate and now he is asymptomatic.  Differential also includes ACS, unstable angina, CHF  Pending labs, chest x-ray, will continue to monitor  Chest x-ray reviewed by me, no infiltrate or effusion.  Lab works quite reassuring, normal troponin, BNP minimally elevated  ----------------------------------------- 9:05 AM on 08/12/2020 -----------------------------------------  Patient's blood pressure is 108/71, heart rate in the 70s.  He remains asymptomatic.  Given reassuring troponin and continued control of heart rate patient appropriate for discharge at this time with close follow-up with his cardiologist Dr. Kirke Corin.  He knows to return if any change in his symptoms.      ____________________________________________   FINAL CLINICAL IMPRESSION(S) / ED DIAGNOSES  Final diagnoses:  Atypical chest pain  Longstanding persistent atrial fibrillation (HCC)        Note:  This document was prepared using Dragon voice recognition software and may include unintentional dictation errors.   Jene Every, MD 08/12/20 [phone]

## 2020-08-12 NOTE — ED Triage Notes (Signed)
Patient c/o heaviness in central chest with sob and nausea. HX a-fib. EMS reports giving 2 1/2mg  metoprolol.

## 2020-08-16 ENCOUNTER — Telehealth: Payer: Self-pay | Admitting: Cardiovascular Disease

## 2020-08-16 NOTE — Telephone Encounter (Signed)
Called to reschedule  No ANS / No VM

## 2020-08-16 NOTE — Telephone Encounter (Signed)
-----   Message from [place] Ouch, MD sent at 08/16/2020  2:07 PM EDT ----- Please reschedule this patient office visit.  He had multiple emergency room visits for chest pain and A. fib and did not show up for her last appointment

## 2020-09-03 NOTE — Telephone Encounter (Signed)
Lmov to schedule  

## 2020-09-10 ENCOUNTER — Encounter: Payer: Self-pay | Admitting: Radiology

## 2020-09-10 ENCOUNTER — Other Ambulatory Visit: Payer: Self-pay

## 2020-09-10 ENCOUNTER — Inpatient Hospital Stay: Payer: Medicare Other

## 2020-09-10 ENCOUNTER — Inpatient Hospital Stay
Admission: EM | Admit: 2020-09-10 | Discharge: 2020-09-12 | DRG: 309 | Disposition: A | Discharge status: Home / Self Care | Payer: Medicare Other | Attending: Internal Medicine | Admitting: Internal Medicine

## 2020-09-10 ENCOUNTER — Emergency Department: Payer: Medicare Other

## 2020-09-10 DIAGNOSIS — F17210 Nicotine dependence, cigarettes, uncomplicated: Secondary | ICD-10-CM | POA: Diagnosis present

## 2020-09-10 DIAGNOSIS — Z8249 Family history of ischemic heart disease and other diseases of the circulatory system: Secondary | ICD-10-CM | POA: Diagnosis not present

## 2020-09-10 DIAGNOSIS — I214 Non-ST elevation (NSTEMI) myocardial infarction: Secondary | ICD-10-CM

## 2020-09-10 DIAGNOSIS — E785 Hyperlipidemia, unspecified: Secondary | ICD-10-CM | POA: Diagnosis present

## 2020-09-10 DIAGNOSIS — R079 Chest pain, unspecified: Secondary | ICD-10-CM | POA: Diagnosis not present

## 2020-09-10 DIAGNOSIS — E876 Hypokalemia: Secondary | ICD-10-CM

## 2020-09-10 DIAGNOSIS — F1020 Alcohol dependence, uncomplicated: Secondary | ICD-10-CM | POA: Diagnosis present

## 2020-09-10 DIAGNOSIS — Z79899 Other long term (current) drug therapy: Secondary | ICD-10-CM | POA: Diagnosis not present

## 2020-09-10 DIAGNOSIS — G8929 Other chronic pain: Secondary | ICD-10-CM | POA: Diagnosis present

## 2020-09-10 DIAGNOSIS — I4819 Other persistent atrial fibrillation: Principal | ICD-10-CM

## 2020-09-10 DIAGNOSIS — I248 Other forms of acute ischemic heart disease: Secondary | ICD-10-CM | POA: Diagnosis present

## 2020-09-10 DIAGNOSIS — F1910 Other psychoactive substance abuse, uncomplicated: Secondary | ICD-10-CM | POA: Diagnosis present

## 2020-09-10 DIAGNOSIS — Z20822 Contact with and (suspected) exposure to covid-19: Secondary | ICD-10-CM | POA: Diagnosis present

## 2020-09-10 DIAGNOSIS — R0789 Other chest pain: Secondary | ICD-10-CM | POA: Diagnosis not present

## 2020-09-10 DIAGNOSIS — Z7982 Long term (current) use of aspirin: Secondary | ICD-10-CM | POA: Diagnosis not present

## 2020-09-10 DIAGNOSIS — I4891 Unspecified atrial fibrillation: Secondary | ICD-10-CM | POA: Diagnosis not present

## 2020-09-10 DIAGNOSIS — R040 Epistaxis: Secondary | ICD-10-CM | POA: Diagnosis not present

## 2020-09-10 DIAGNOSIS — I739 Peripheral vascular disease, unspecified: Secondary | ICD-10-CM | POA: Diagnosis present

## 2020-09-10 DIAGNOSIS — R519 Headache, unspecified: Secondary | ICD-10-CM | POA: Diagnosis not present

## 2020-09-10 DIAGNOSIS — Y905 Blood alcohol level of 100-119 mg/100 ml: Secondary | ICD-10-CM | POA: Diagnosis present

## 2020-09-10 DIAGNOSIS — I10 Essential (primary) hypertension: Secondary | ICD-10-CM | POA: Diagnosis present

## 2020-09-10 DIAGNOSIS — I2489 Other forms of acute ischemic heart disease: Secondary | ICD-10-CM | POA: Diagnosis present

## 2020-09-10 DIAGNOSIS — I2510 Atherosclerotic heart disease of native coronary artery without angina pectoris: Secondary | ICD-10-CM | POA: Diagnosis present

## 2020-09-10 DIAGNOSIS — Z9114 Patient's other noncompliance with medication regimen: Secondary | ICD-10-CM

## 2020-09-10 DIAGNOSIS — I493 Ventricular premature depolarization: Secondary | ICD-10-CM | POA: Diagnosis present

## 2020-09-10 DIAGNOSIS — F1010 Alcohol abuse, uncomplicated: Secondary | ICD-10-CM

## 2020-09-10 DIAGNOSIS — I700 Atherosclerosis of aorta: Secondary | ICD-10-CM | POA: Diagnosis present

## 2020-09-10 LAB — RESP PANEL BY RT-PCR (FLU A&B, COVID) ARPGX2
Influenza A by PCR: NEGATIVE
Influenza B by PCR: NEGATIVE
SARS Coronavirus 2 by RT PCR: NEGATIVE

## 2020-09-10 LAB — CBC WITH DIFFERENTIAL/PLATELET
Abs Immature Granulocytes: 0.02 10*3/uL (ref 0.00–0.07)
Basophils Absolute: 0.1 10*3/uL (ref 0.0–0.1)
Basophils Relative: 1 %
Eosinophils Absolute: 0.1 10*3/uL (ref 0.0–0.5)
Eosinophils Relative: 1 %
HCT: 40.7 % (ref 39.0–52.0)
Hemoglobin: 14 g/dL (ref 13.0–17.0)
Immature Granulocytes: 0 %
Lymphocytes Relative: 58 %
Lymphs Abs: 3.4 10*3/uL (ref 0.7–4.0)
MCH: 31.8 pg (ref 26.0–34.0)
MCHC: 34.4 g/dL (ref 30.0–36.0)
MCV: 92.5 fL (ref 80.0–100.0)
Monocytes Absolute: 0.3 10*3/uL (ref 0.1–1.0)
Monocytes Relative: 5 %
Neutro Abs: 2.1 10*3/uL (ref 1.7–7.7)
Neutrophils Relative %: 35 %
Platelets: 267 10*3/uL (ref 150–400)
RBC: 4.4 MIL/uL (ref 4.22–5.81)
RDW: 14.6 % (ref 11.5–15.5)
WBC: 5.9 10*3/uL (ref 4.0–10.5)
nRBC: 0 % (ref 0.0–0.2)

## 2020-09-10 LAB — LIPID PANEL
Cholesterol: 233 mg/dL — ABNORMAL HIGH (ref 0–200)
HDL: 47 mg/dL (ref >40)
LDL Cholesterol: 131 mg/dL — ABNORMAL HIGH (ref 0–99)
Total CHOL/HDL Ratio: 5 RATIO
Triglycerides: 274 mg/dL — ABNORMAL HIGH (ref <150)
VLDL: 55 mg/dL — ABNORMAL HIGH (ref 0–40)

## 2020-09-10 LAB — URINE DRUG SCREEN, QUALITATIVE (ARMC ONLY)
Amphetamines, Ur Screen: NOT DETECTED
Barbiturates, Ur Screen: NOT DETECTED
Benzodiazepine, Ur Scrn: NOT DETECTED
Cannabinoid 50 Ng, Ur ~~LOC~~: NOT DETECTED
Cocaine Metabolite,Ur ~~LOC~~: NOT DETECTED
MDMA (Ecstasy)Ur Screen: NOT DETECTED
Methadone Scn, Ur: NOT DETECTED
Opiate, Ur Screen: NOT DETECTED
Phencyclidine (PCP) Ur S: NOT DETECTED
Tricyclic, Ur Screen: NOT DETECTED

## 2020-09-10 LAB — HEPARIN LEVEL (UNFRACTIONATED)
Heparin Unfractionated: <0.1 IU/mL — ABNORMAL LOW (ref 0.30–0.70)
Heparin Unfractionated: 0.8 IU/mL — ABNORMAL HIGH (ref 0.30–0.70)

## 2020-09-10 LAB — COMPREHENSIVE METABOLIC PANEL
ALT: 53 U/L — ABNORMAL HIGH (ref 0–44)
AST: 38 U/L (ref 15–41)
Albumin: 3.8 g/dL (ref 3.5–5.0)
Alkaline Phosphatase: 93 U/L (ref 38–126)
Anion gap: 10 (ref 5–15)
BUN: 12 mg/dL (ref 8–23)
CO2: 20 mmol/L — ABNORMAL LOW (ref 22–32)
Calcium: 8.8 mg/dL — ABNORMAL LOW (ref 8.9–10.3)
Chloride: 107 mmol/L (ref 98–111)
Creatinine, Ser: 1.18 mg/dL (ref 0.61–1.24)
GFR, Estimated: >60 mL/min (ref >60)
Glucose, Bld: 98 mg/dL (ref 70–99)
Potassium: 3.3 mmol/L — ABNORMAL LOW (ref 3.5–5.1)
Sodium: 137 mmol/L (ref 135–145)
Total Bilirubin: 0.5 mg/dL (ref 0.3–1.2)
Total Protein: 7.2 g/dL (ref 6.5–8.1)

## 2020-09-10 LAB — PROTIME-INR
INR: 1 (ref 0.8–1.2)
Prothrombin Time: 12.9 seconds (ref 11.4–15.2)

## 2020-09-10 LAB — MAGNESIUM: Magnesium: 2.3 mg/dL (ref 1.7–2.4)

## 2020-09-10 LAB — APTT: aPTT: 29 seconds (ref 24–36)

## 2020-09-10 LAB — TSH
TSH: 2.279 u[IU]/mL (ref 0.350–4.500)
TSH: 1.452 u[IU]/mL (ref 0.350–4.500)

## 2020-09-10 LAB — ETHANOL: Alcohol, Ethyl (B): 112 mg/dL — ABNORMAL HIGH (ref <10)

## 2020-09-10 LAB — TROPONIN I (HIGH SENSITIVITY)
Troponin I (High Sensitivity): 88 ng/L — ABNORMAL HIGH (ref <18)
Troponin I (High Sensitivity): 91 ng/L — ABNORMAL HIGH (ref <18)

## 2020-09-10 MED ORDER — MAGNESIUM HYDROXIDE 400 MG/5ML PO SUSP: 30.0000 mL | Freq: Every day | ORAL | Status: DC | PRN

## 2020-09-10 MED ORDER — POTASSIUM CHLORIDE CRYS ER 20 MEQ PO TBCR
40.0000 meq | EXTENDED_RELEASE_TABLET | Freq: Once | ORAL | Status: AC
  Administered 2020-09-10: 40 meq via ORAL
  Filled 2020-09-10: qty 2

## 2020-09-10 MED ORDER — ACETAMINOPHEN 650 MG RE SUPP: 650.0000 mg | Freq: Four times a day (QID) | RECTAL | Status: DC | PRN

## 2020-09-10 MED ORDER — ACETAMINOPHEN 325 MG PO TABS: 650.0000 mg | ORAL_TABLET | Freq: Four times a day (QID) | ORAL | Status: DC | PRN

## 2020-09-10 MED ORDER — ASPIRIN EC 81 MG PO TBEC
81.0000 mg | DELAYED_RELEASE_TABLET | Freq: Every day | ORAL | Status: DC
  Administered 2020-09-10 – 2020-09-12 (×3): 81 mg via ORAL
  Filled 2020-09-10 (×3): qty 1

## 2020-09-10 MED ORDER — ROSUVASTATIN CALCIUM 10 MG PO TABS
20.0000 mg | ORAL_TABLET | Freq: Every day | ORAL | Status: DC
  Administered 2020-09-11 – 2020-09-12 (×2): 20 mg via ORAL
  Filled 2020-09-10: qty 2
  Filled 2020-09-10: qty 1
  Filled 2020-09-10: qty 2

## 2020-09-10 MED ORDER — HEPARIN (PORCINE) 25000 UT/250ML-% IV SOLN
900.0000 [IU]/h | INTRAVENOUS | Status: DC
  Administered 2020-09-10: 900 [IU]/h via INTRAVENOUS
  Filled 2020-09-10: qty 250

## 2020-09-10 MED ORDER — DILTIAZEM HCL 25 MG/5ML IV SOLN
5.0000 mg | Freq: Once | INTRAVENOUS | Status: AC
  Administered 2020-09-10: 5 mg via INTRAVENOUS

## 2020-09-10 MED ORDER — METOPROLOL SUCCINATE ER 100 MG PO TB24
100.0000 mg | ORAL_TABLET | Freq: Every day | ORAL | Status: DC
  Administered 2020-09-10: 100 mg via ORAL
  Filled 2020-09-10: qty 2

## 2020-09-10 MED ORDER — ONDANSETRON HCL 4 MG PO TABS: 4.0000 mg | ORAL_TABLET | Freq: Four times a day (QID) | ORAL | Status: DC | PRN

## 2020-09-10 MED ORDER — HEPARIN (PORCINE) 25000 UT/250ML-% IV SOLN
800.0000 [IU]/h | INTRAVENOUS | Status: DC
  Administered 2020-09-10: 900 [IU]/h via INTRAVENOUS
  Filled 2020-09-10: qty 250

## 2020-09-10 MED ORDER — HEPARIN BOLUS VIA INFUSION
4000.0000 [IU] | Freq: Once | INTRAVENOUS | Status: AC
  Administered 2020-09-10: 4000 [IU] via INTRAVENOUS
  Filled 2020-09-10: qty 4000

## 2020-09-10 MED ORDER — DILTIAZEM HCL 25 MG/5ML IV SOLN
INTRAVENOUS | Status: AC
  Filled 2020-09-10: qty 5

## 2020-09-10 MED ORDER — ASPIRIN 81 MG PO CHEW
324.0000 mg | CHEWABLE_TABLET | Freq: Once | ORAL | Status: AC
  Administered 2020-09-10: 324 mg via ORAL
  Filled 2020-09-10: qty 4

## 2020-09-10 MED ORDER — SODIUM CHLORIDE 0.9 % IV SOLN: INTRAVENOUS | Status: DC

## 2020-09-10 MED ORDER — DILTIAZEM HCL-DEXTROSE 125-5 MG/125ML-% IV SOLN (PREMIX)
5.0000 mg/h | INTRAVENOUS | Status: DC
  Administered 2020-09-10: 5 mg/h via INTRAVENOUS
  Filled 2020-09-10: qty 125

## 2020-09-10 MED ORDER — DILTIAZEM HCL 25 MG/5ML IV SOLN
INTRAVENOUS | Status: AC
  Administered 2020-09-10: 10 mg
  Filled 2020-09-10: qty 5

## 2020-09-10 MED ORDER — DILTIAZEM HCL 30 MG PO TABS
30.0000 mg | ORAL_TABLET | Freq: Four times a day (QID) | ORAL | Status: DC
  Administered 2020-09-10 – 2020-09-11 (×3): 30 mg via ORAL
  Filled 2020-09-10 (×3): qty 1

## 2020-09-10 MED ORDER — ONDANSETRON HCL 4 MG/2ML IJ SOLN: 4.0000 mg | Freq: Four times a day (QID) | INTRAMUSCULAR | Status: DC | PRN

## 2020-09-10 MED ORDER — DILTIAZEM LOAD VIA INFUSION
10.0000 mg | Freq: Once | INTRAVENOUS | Status: DC
  Filled 2020-09-10: qty 10

## 2020-09-10 MED ORDER — TRAZODONE HCL 50 MG PO TABS
25.0000 mg | ORAL_TABLET | Freq: Every evening | ORAL | Status: DC | PRN
  Administered 2020-09-11 (×2): 25 mg via ORAL
  Filled 2020-09-10 (×2): qty 1

## 2020-09-10 NOTE — ED Notes (Signed)
Pt sitting up in bed eating breakfast at this time, NAD noted. Pt denies further needs at this time.

## 2020-09-10 NOTE — ED Notes (Addendum)
Per DO Griffith order,  D/c heparin and pt to get head CT at this time, and no second covid swab required. Heparin D/C at this time by this RN. Pt resting in bed with eyes closed, NAD noted, RR even and unlabored at this time.

## 2020-09-10 NOTE — Progress Notes (Signed)
  PROGRESS NOTE    Sloane Junkin  RXV:[id number] DOB: [birth year] DOA: 09/10/2020  PCP: Patient, No Pcp Per (Inactive)    LOS - 0    Patient admitted earlier this AM with A-fib RVR requiring Cardizem infusion.  Interval subjective: Seen in ED holding for a bed.  Feeling better, sometimes gets palpitations but no longer feels SOB with them.  Had a headache earlier this AM he says has resolved.  NO other acute complaints.   Exam: awake sitting up in bed, eating lunch, no acute distress.  Heart sounds irregularly irregular.  Lungs CTAB,  Abdomen soft and non-tender.  No lower extremity edema.    Active Problems:   Atrial fibrillation with RVR (HCC)    I have reviewed the full H&P by Dr. Arville Care in detail, and I agree with the assessment and plan as outlined therein. In addition:  Pt had sudden onset headache after heparin started this AM. Heparin was held Got head CT which ruled out bleed. Heparin resumed.  --Follow up cardiology recommendations  No Charge    Pennie Banter, DO [facility]   If 7PM-7AM, please contact night-coverage [email or web address] 09/10/2020, 7:52 AM
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
## 2020-09-10 NOTE — Progress Notes (Addendum)
ANTICOAGULATION CONSULT NOTE   Pharmacy Consult for heparin infusion Indication: ACS/STEMI  No Known Allergies  Patient Measurements: Height: 5\' 6"  (167.6 cm) Weight: 70.3 kg (155 lb) IBW/kg (Calculated) : 63.8 Heparin Dosing Weight: 70.3 kg  Vital Signs: Temp: 97.7 F (36.5 C) (04/12 0408) Temp Source: Oral (04/12 0408) BP: 100/71 (04/12 0450) Pulse Rate: 71 (04/12 0450)  Labs: Recent Labs    09/10/20 0411  HGB 14.0  HCT 40.7  PLT 267  CREATININE 1.18  TROPONINIHS 88*    Estimated Creatinine Clearance: 56.3 mL/min (by C-G formula based on SCr of 1.18 mg/dL).   Medical History: Past Medical History:  Diagnosis Date  . Claudication (HCC)    a. 06/2015 ABI: R - 0.73, L - 0.73. 30-49% bilat SFA stenosis. 50-74% R Profunda stenosis; b. 01/2017 ABI: R 0.89, L 0.88, TBI R 0.65, L 1.0.  . Erectile dysfunction   . Essential hypertension   . History of echocardiogram    a. 03/29/2014: EF 55-60%, mild LVH, normal RVSP;  b. 05/2015 Echo: EF 60-65%, triv AI, mild MR; c. 07/2020 Echo:   08/2020 Hyperlipidemia   . Non-obstructive CAD    a. 01/27/2014 Cath: LM nl, LAD mild diff dz w/o obs, LCx no sig obs - scattered 20-30% mLCx, RCA no obs dz, EF 70%; b. 06/2015 MV; EF 60%, no ischemia.  07/2015 PAF (paroxysmal atrial fibrillation) (HCC)    a. Pt says Dx >76yrs ago w/ ? RFCA @ [facility];  b. Recurrent 12/2013;  b. Rx Flecainide and Xarelto-->Intermittent compliance.  01/2014 PVC's (premature ventricular contractions)    a. 05/2017 Zio: 4000 PVCs in 48 hrs (2%). Brief run of SVT.  . Tobacco abuse    a. ongoing - 1 ppd.    Medications:  PTA Meds:  Xarelto 20 mg  Assessment: Pt is 65 yo male being started on Heparin for ACS/STEMI (no RN/MD notes available at this time.)  Goal of Therapy:  Heparin level 0.3-0.7 units/ml Monitor platelets by anticoagulation protocol: Yes  Goal aPTT: 66 - 102   Plan:  Ordered baseline labs Ordered 4000 unit bolus x 1 Ordered heparin infusion to start at rate of 900  units/hr Since PTA meds include Xarelto, will follow aPTT until it correlates with HL. Will check aPTT 6 hours after infusion starts. CBC & HL daily while on heparin.  Addendum:  Per med rec tech, pt states he was not taking Xarelto PTA.  Will check baseline labs to confirm and will follow HL instead of aPTT.  75, PharmD, [facility] 09/10/2020 5:17 AM

## 2020-09-10 NOTE — Progress Notes (Signed)
ANTICOAGULATION CONSULT NOTE   Pharmacy Consult for heparin infusion Indication: ACS/STEMI  No Known Allergies  Patient Measurements: Height: 5\' 6"  (167.6 cm) Weight: 70.3 kg (155 lb) IBW/kg (Calculated) : 63.8 Heparin Dosing Weight: 70.3 kg  Vital Signs: Temp: 98.2 F (36.8 C) (04/12 1840) Temp Source: Oral (04/12 1840) BP: 118/96 (04/12 1840) Pulse Rate: 72 (04/12 1840)  Labs: Recent Labs    09/10/20 0411 09/10/20 0514 09/10/20 0607 09/10/20 1907  HGB 14.0  --   --   --   HCT 40.7  --   --   --   PLT 267  --   --   --   APTT  --  29  --   --   LABPROT  --  12.9  --   --   INR  --  1.0  --   --   HEPARINUNFRC  --  <0.10*  --  0.80*  CREATININE 1.18  --   --   --   TROPONINIHS 88*  --  91*  --     Estimated Creatinine Clearance: 56.3 mL/min (by C-G formula based on SCr of 1.18 mg/dL).   Medical History: Past Medical History:  Diagnosis Date  . Claudication (HCC)    a. 06/2015 ABI: R - 0.73, L - 0.73. 30-49% bilat SFA stenosis. 50-74% R Profunda stenosis; b. 01/2017 ABI: R 0.89, L 0.88, TBI R 0.65, L 1.0.  . Erectile dysfunction   . Essential hypertension   . History of echocardiogram    a. 03/29/2014: EF 55-60%, mild LVH, normal RVSP;  b. 05/2015 Echo: EF 60-65%, triv AI, mild MR; c. 07/2020 Echo:   08/2020 Hyperlipidemia   . Non-obstructive CAD    a. 01/27/2014 Cath: LM nl, LAD mild diff dz w/o obs, LCx no sig obs - scattered 20-30% mLCx, RCA no obs dz, EF 70%; b. 06/2015 MV; EF 60%, no ischemia.  07/2015 PAF (paroxysmal atrial fibrillation) (HCC)    a. Pt says Dx >39yrs ago w/ ? RFCA @ [facility];  b. Recurrent 12/2013;  b. Rx Flecainide and Xarelto-->Intermittent compliance.  01/2014 PVC's (premature ventricular contractions)    a. 05/2017 Zio: 4000 PVCs in 48 hrs (2%). Brief run of SVT.  . Tobacco abuse    a. ongoing - 1 ppd.    Medications:  PTA Meds:  Xarelto 20 mg -- not taking  Assessment: Pt is 65 yo male w/ h/o CAD, parAFib (off Xarelto for couple years for an episode of  epistaxis), HTN and tobacco abuse presenting now with acute onset of midsternal chest pain felt as a sharp pain and graded 10/10 in severity with radiation to his neck. Pt was initially started onHeparin for ACS/STEMI, but held 4/12 ~0800 ISO concerning headache, CT head was negative for acute abnormalities, and pharmacy has been consulted for the continue mgmt of hep gtt.  Baseline labs corroborate no DOAC use PTA (HL <0.1). APTT 29s, INR 1.0, H/H/Plts WNL Will CTM for transition to DOAC per pt affordability discussion ongoing with patient.   Date Time HL Rate/Comment 4/12 0600 -- Bolus 4000 un; then started at 900 un/hr 4/12 0800 -- Hep gtt held ISO concerning headache, CT neg; Resume>> 4/12 1700  __ 900 un/hr 4/12 1907 0.80 Supratherapeutic   Goal of Therapy:  Heparin level 0.3-0.7 units/ml Monitor platelets by anticoagulation protocol: Yes     Plan:   HL slightly supratherapeutic. Will decrease infusion rate to 800 units/hr.  Will check HL 6 hours after infusion rate  change  CBC & HL daily while on heparin.  Sharen Hones, PharmD, BCPS 09/10/2020 8:07 PM

## 2020-09-10 NOTE — ED Notes (Signed)
Hospitalist at bedside 

## 2020-09-10 NOTE — ED Provider Notes (Signed)
[facility] Emergency Department Provider Note  ____________________________________________   Event Date/Time   First MD Initiated Contact with Patient 09/10/20 0407     (approximate)  I have reviewed the triage vital signs and the nursing notes.   HISTORY  Chief Complaint Shortness of Breath (Presenting via EMS from home with CP and SOB starting yesterday at 5pm. Afib RVR on arrival HR 160s. Takes metoprolol for afib at home)   HPI Darren Rogers is a 65 y.o. male with medical history significant foratrial fibrillation on metoprolol, who stopped Xarelto on his own due to nosebleeds, CAD, PAD,HTN, alcohol use disorder and nicotine dependence who presents EMS from home for assessment of shortness of breath chest pain palpitations that began yesterday around 5 PM.  Patient states he has been taking his metoprolol but this did not help.  States he feels this is A. fib and is very frustrated because this is the fourth time he is coming to the emergency room last couple months for the same thing.  He states he has chronic pain in his lower extremities whenever he walks any significant distance and often feels very fatigued but denies any other acute symptoms including headache or earache, sore throat, nausea, vomiting, cough, fevers, diarrhea, dysuria, abdominal pain, rash or recent falls or injuries.  He does note he has missed many of his recent cardiology follow-up visits and states he only drinks about 1 beer per day.  He denies any recent illicit drug use.         Past Medical History:  Diagnosis Date  . Claudication (HCC)    a. 06/2015 ABI: R - 0.73, L - 0.73. 30-49% bilat SFA stenosis. 50-74% R Profunda stenosis; b. 01/2017 ABI: R 0.89, L 0.88, TBI R 0.65, L 1.0.  . Erectile dysfunction   . Essential hypertension   . History of echocardiogram    a. 03/29/2014: EF 55-60%, mild LVH, normal RVSP;  b. 05/2015 Echo: EF 60-65%, triv AI, mild MR; c. 07/2020 Echo:    Marland Kitchen Hyperlipidemia   . Non-obstructive CAD    a. 01/27/2014 Cath: LM nl, LAD mild diff dz w/o obs, LCx no sig obs - scattered 20-30% mLCx, RCA no obs dz, EF 70%; b. 06/2015 MV; EF 60%, no ischemia.  Marland Kitchen PAF (paroxysmal atrial fibrillation) (HCC)    a. Pt says Dx >55yrs ago w/ ? RFCA @ [facility];  b. Recurrent 12/2013;  b. Rx Flecainide and Xarelto-->Intermittent compliance.  Marland Kitchen PVC's (premature ventricular contractions)    a. 05/2017 Zio: 4000 PVCs in 48 hrs (2%). Brief run of SVT.  . Tobacco abuse    a. ongoing - 1 ppd.    Patient Active Problem List   Diagnosis Date Noted  . Atrial fibrillation with RVR (HCC) 08/06/2020  . Demand ischemia (HCC)   . Polysubstance abuse (HCC)   . Rapid atrial fibrillation (HCC) 08/05/2020  . Alcohol use disorder, moderate, dependence (HCC) 08/05/2020  . Cocaine abuse (HCC) 08/05/2020  . Alcohol abuse 10/20/2019  . Depression 10/20/2019  . A-fib (HCC) 01/15/2017  . Personal history of tobacco use, presenting hazards to health 08/17/2016  . BPH associated with nocturia 07/30/2016  . IFG (impaired fasting glucose) 07/30/2016  . Chest pain 08/22/2015  . Essential hypertension   . PAD (peripheral artery disease) (HCC)   . 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 CAD in native artery 04/11/2014  . Paroxysmal atrial fibrillation (HCC)   . Tobacco abuse   . Hyperlipidemia   .  Atrial fibrillation with rapid ventricular response (HCC) 01/27/2014    Past Surgical History:  Procedure Laterality Date  . CARDIAC CATHETERIZATION    . CARDIAC CATHETERIZATION     [facility]  . LEFT HEART CATH Right 01/27/2014   Procedure: LEFT HEART CATH;  Surgeon: Micheline Chapman, MD;  Location: [facility] CATH LAB;  Service: Cardiovascular;  Laterality: Right;    Prior to Admission medications   Medication Sig Start Date End Date Taking? Authorizing Provider  aspirin EC 81 MG tablet Take 1 tablet (81 mg total) by mouth daily. 08/16/19  Yes Emily Filbert, MD  diltiazem (CARDIZEM  CD) 240 MG 24 hr capsule Take 1 capsule (240 mg total) by mouth daily. 08/06/20  Yes Wouk, Wilfred Curtis, MD  metoprolol succinate (TOPROL-XL) 100 MG 24 hr tablet Take 1 tablet (100 mg total) by mouth daily. Take with or immediately following a meal. 08/06/20  Yes Wouk, Wilfred Curtis, MD  rivaroxaban (XARELTO) 20 MG TABS tablet Take 1 tablet (20 mg total) by mouth daily with supper. Patient not taking: Reported on 09/10/2020 08/06/20   Kathrynn Running, MD    Allergies Patient has no known allergies.  Family History  Problem Relation Age of Onset  . Coronary artery disease Father 6  . Diabetes Mother   . Diabetes Sister   . Diabetes Brother   . Diabetes Maternal Grandmother   . Diabetes Sister   . Diabetes Sister     Social History Social History   Tobacco Use  . Smoking status: Current Every Day Smoker    Packs/day: 0.50    Years: 30.00    Pack years: 15.00    Types: Cigarettes  . Smokeless tobacco: Never Used  Substance Use Topics  . Alcohol use: Yes    Alcohol/week: 12.0 standard drinks    Types: 12 Cans of beer per week  . Drug use: Yes    Types: Cocaine    Review of Systems  Review of Systems  Constitutional: Negative for chills and fever.  HENT: Negative for sore throat.   Eyes: Negative for pain.  Respiratory: Positive for shortness of breath. Negative for cough and stridor.   Cardiovascular: Positive for chest pain and palpitations.  Gastrointestinal: Negative for vomiting.  Musculoskeletal: Positive for myalgias ( b/l lower extremities w/ ambulation, chronic).  Skin: Negative for rash.  Neurological: Negative for seizures, loss of consciousness and headaches.  Psychiatric/Behavioral: Negative for suicidal ideas.  All other systems reviewed and are negative.     ____________________________________________   PHYSICAL EXAM:  VITAL SIGNS: ED Triage Vitals  Enc Vitals Group     BP 09/10/20 0408 (!) 132/105     Pulse Rate 09/10/20 0408 (!) 138     Resp  09/10/20 0408 (!) 21     Temp 09/10/20 0408 97.7 F (36.5 C)     Temp Source 09/10/20 0408 Oral     SpO2 09/10/20 0408 100 %     Weight 09/10/20 0409 155 lb (70.3 kg)     Height 09/10/20 0409 5\' 6"  (1.676 m)     Head Circumference --      Peak Flow --      Pain Score 09/10/20 0409 6     Pain Loc --      Pain Edu? --      Excl. in GC? --    Vitals:   09/10/20 0430 09/10/20 0450  BP: 94/62 100/71  Pulse: (!) 57 71  Resp: 12 16  Temp:  SpO2: 96% 98%   Physical Exam Vitals and nursing note reviewed.  Constitutional:      Appearance: He is well-developed.  HENT:     Head: Normocephalic and atraumatic.     Right Ear: External ear normal.     Left Ear: External ear normal.     Nose: Nose normal.  Eyes:     Conjunctiva/sclera: Conjunctivae normal.  Cardiovascular:     Rate and Rhythm: Tachycardia present. Rhythm irregular.     Heart sounds: No murmur heard.   Pulmonary:     Effort: Pulmonary effort is normal. No respiratory distress.     Breath sounds: Normal breath sounds.  Abdominal:     Palpations: Abdomen is soft.     Tenderness: There is no abdominal tenderness.  Musculoskeletal:     Cervical back: Neck supple.  Skin:    General: Skin is warm and dry.  Neurological:     Mental Status: He is alert and oriented to person, place, and time.  Psychiatric:        Mood and Affect: Mood normal.      ____________________________________________   LABS (all labs ordered are listed, but only abnormal results are displayed)  Labs Reviewed  COMPREHENSIVE METABOLIC PANEL - Abnormal; Notable for the following components:      Result Value   Potassium 3.3 (*)    CO2 20 (*)    Calcium 8.8 (*)    ALT 53 (*)    All other components within normal limits  ETHANOL - Abnormal; Notable for the following components:   Alcohol, Ethyl (B) 112 (*)    All other components within normal limits  TROPONIN I (HIGH SENSITIVITY) - Abnormal; Notable for the following components:    Troponin I (High Sensitivity) 88 (*)    All other components within normal limits  RESP PANEL BY RT-PCR (FLU A&B, COVID) ARPGX2  SARS CORONAVIRUS 2 (TAT 6-24 HRS)  CBC WITH DIFFERENTIAL/PLATELET  MAGNESIUM  TSH  URINE DRUG SCREEN, QUALITATIVE ([facility] ONLY)  APTT  PROTIME-INR  HEPARIN LEVEL (UNFRACTIONATED)  TSH  TROPONIN I (HIGH SENSITIVITY)   ____________________________________________  EKG  A. fib with a ventricular rate of 156, normal axis, unremarkable intervals and some nonspecific ST changes and T wave inversions in inferior lateral leads. ____________________________________________  RADIOLOGY  ED MD interpretation: No full consolidation, effusion, significant edema, pneumothorax or other clear acute thoracic process.  Official radiology report(s): DG Chest Portable 1 View  Result Date: 09/10/2020 CLINICAL DATA:  Chest pain EXAM: PORTABLE CHEST 1 VIEW COMPARISON:  08/12/2020 FINDINGS: The lungs are symmetrically well expanded and are clear save for minimal linear scarring within the left mid lung zone. No pneumothorax or pleural effusion. Cardiac size within normal limits. Pulmonary vascularity is normal. Multiple bilateral healed rib fractures are identified. No acute bone abnormality. IMPRESSION: No active disease. Electronically Signed   By: Helyn Numbers MD   On: 09/10/2020 04:49    ____________________________________________   PROCEDURES  Procedure(s) performed (including Critical Care):  .Critical Care Performed by: Gilles Chiquito, MD Authorized by: Gilles Chiquito, MD   Critical care provider statement:    Critical care time (minutes):  45   Critical care was necessary to treat or prevent imminent or life-threatening deterioration of the following conditions:  Cardiac failure   Critical care was time spent personally by me on the following activities:  Discussions with consultants, evaluation of patient's response to treatment, examination of patient,  ordering and performing treatments and interventions, ordering  and review of laboratory studies, ordering and review of radiographic studies, pulse oximetry, re-evaluation of patient's condition, obtaining history from patient or surrogate and review of old charts     ____________________________________________   INITIAL IMPRESSION / ASSESSMENT AND PLAN / ED COURSE      Patient presents with above stated history and exam for assessment of chest pain and shortness of breath.  On arrival patient is tachycardic with heart rates in the 150s to 60s with otherwise stable vital signs on room air.  Differential includes A. fib with RVR versus other symptomatic tachydysrhythmia, ACS, pneumonia, symptomatic anemia, metabolic derangements, pericarditis, myocarditis, and PE.  ECG shows A. fib with RVR as noted above.  Chest x-ray has no evidence of pneumonia pneumothorax or overt edema.  CMP obtained shows a K of 3.3 with no other significant electrolyte or metabolic derangements.  CBC shows no acute leukocytosis or acute anemia to explain patient's symptoms.  Initial troponin is slightly elevated at 88 which I suspect represents a mild demand ischemia although given patient did have some nonspecific ECG findings he was complaining of some chest tightness earlier he was given ASA and started on heparin drip.  He was rate controlled on diltiazem drip.  Serum ethanol elevated at 112.    On several reassessments patient stated he felt much better with near complete resolution of his chest pain.  I will plan to admit to hospital service for further evaluation and management.    ____________________________________________   FINAL CLINICAL IMPRESSION(S) / ED DIAGNOSES  Final diagnoses:  Atrial fibrillation with RVR (HCC)  NSTEMI (non-ST elevated myocardial infarction) (HCC)  ETOH abuse  Hypokalemia    Medications  diltiazem (CARDIZEM) 1 mg/mL load via infusion 10 mg (10 mg Intravenous Not Given  09/10/20 0428)    And  diltiazem (CARDIZEM) 125 mg in dextrose 5% 125 mL (1 mg/mL) infusion (7 mg/hr Intravenous Rate/Dose Change 09/10/20 0440)  heparin bolus via infusion 4,000 Units (has no administration in time range)    Followed by  heparin ADULT infusion 100 units/mL (25000 units/227mL) (has no administration in time range)  aspirin EC tablet 81 mg (has no administration in time range)  metoprolol succinate (TOPROL-XL) 24 hr tablet 100 mg (has no administration in time range)  rosuvastatin (CRESTOR) tablet 20 mg (has no administration in time range)  0.9 %  sodium chloride infusion (has no administration in time range)  acetaminophen (TYLENOL) tablet 650 mg (has no administration in time range)    Or  acetaminophen (TYLENOL) suppository 650 mg (has no administration in time range)  traZODone (DESYREL) tablet 25 mg (has no administration in time range)  magnesium hydroxide (MILK OF MAGNESIA) suspension 30 mL (has no administration in time range)  ondansetron (ZOFRAN) tablet 4 mg (has no administration in time range)    Or  ondansetron (ZOFRAN) injection 4 mg (has no administration in time range)  diltiazem (CARDIZEM) 25 MG/5ML injection (10 mg  Given 09/10/20 0416)  potassium chloride SA (KLOR-CON) CR tablet 40 mEq (40 mEq Oral Given 09/10/20 0512)  aspirin chewable tablet 324 mg (324 mg Oral Given 09/10/20 7672)     ED Discharge Orders    None       Note:  This document was prepared using Dragon voice recognition software and may include unintentional dictation errors.   Gilles Chiquito, MD 09/10/20 [phone]

## 2020-09-10 NOTE — Progress Notes (Signed)
ANTICOAGULATION CONSULT NOTE   Pharmacy Consult for heparin infusion Indication: ACS/STEMI  No Known Allergies  Patient Measurements: Height: 5\' 6"  (167.6 cm) Weight: 70.3 kg (155 lb) IBW/kg (Calculated) : 63.8 Heparin Dosing Weight: 70.3 kg  Vital Signs: Temp: 97.7 F (36.5 C) (04/12 0408) Temp Source: Oral (04/12 0408) BP: 119/78 (04/12 1230) Pulse Rate: 103 (04/12 1230)  Labs: Recent Labs    09/10/20 0411 09/10/20 0514 09/10/20 0607  HGB 14.0  --   --   HCT 40.7  --   --   PLT 267  --   --   APTT  --  29  --   LABPROT  --  12.9  --   INR  --  1.0  --   HEPARINUNFRC  --  <0.10*  --   CREATININE 1.18  --   --   TROPONINIHS 88*  --  91*    Estimated Creatinine Clearance: 56.3 mL/min (by C-G formula based on SCr of 1.18 mg/dL).   Medical History: Past Medical History:  Diagnosis Date  . Claudication (HCC)    a. 06/2015 ABI: R - 0.73, L - 0.73. 30-49% bilat SFA stenosis. 50-74% R Profunda stenosis; b. 01/2017 ABI: R 0.89, L 0.88, TBI R 0.65, L 1.0.  . Erectile dysfunction   . Essential hypertension   . History of echocardiogram    a. 03/29/2014: EF 55-60%, mild LVH, normal RVSP;  b. 05/2015 Echo: EF 60-65%, triv AI, mild MR; c. 07/2020 Echo:   08/2020 Hyperlipidemia   . Non-obstructive CAD    a. 01/27/2014 Cath: LM nl, LAD mild diff dz w/o obs, LCx no sig obs - scattered 20-30% mLCx, RCA no obs dz, EF 70%; b. 06/2015 MV; EF 60%, no ischemia.  07/2015 PAF (paroxysmal atrial fibrillation) (HCC)    a. Pt says Dx >26yrs ago w/ ? RFCA @ [facility];  b. Recurrent 12/2013;  b. Rx Flecainide and Xarelto-->Intermittent compliance.  01/2014 PVC's (premature ventricular contractions)    a. 05/2017 [facility]io: 4000 PVCs in 48 hrs (2%). Brief run of SVT.  . Tobacco abuse    a. ongoing - 1 ppd.    Medications:  PTA Meds:  Xarelto 20 mg  Assessment: Pt is 65 yo male w/ h/o CAD, parAFib (off Xarelto for couple years for an episode of epistaxis), HTN and tobacco abuse presenting now with acute onset of  midsternal chest pain felt as a sharp pain and graded 10/10 in severity with radiation to his neck. Pt was initially started onHeparin for ACS/STEMI, but held 4/12 ~0800 ISO concerning headache, CT head was negative for acute abnormalities, and pharmacy has been consulted for the continue mgmt of hep gtt.  Baseline labs corroborate no DOAC use PTA (HL <0.1). APTT 29s, INR 1.0, H/H/Plts WNL Will CTM for transition to DOAC per pt affordability discussion ongoing with patient.   Date Time HL Rate/Comment 4/12 0600 -- Bolus 4000 un; then started at 900 un/hr 4/12 0800 -- Hep gtt held ISO concerning headache, CT neg; Resume>> 4/12 1700  __ 900 un/hr  Goal of Therapy:  Heparin level 0.3-0.7 units/ml Monitor platelets by anticoagulation protocol: Yes     Plan:  Hep gtt off since 0800; will rebolus 4000 unit x1 & resume at prior rate of 900 units/hr Will check HL 6 hours after infusion starts. CBC & HL daily while on heparin.   Addendum:  Per med rec tech, pt states he was not taking Xarelto PTA.  Will check baseline labs to  confirm and will follow HL instead of aPTT.  (HL <0.1 at BL and aPTT WNL)  Martyn Malay, [facility] 09/10/2020 12:55 PM

## 2020-09-10 NOTE — ED Notes (Signed)
Admitting DO Jiles Harold regarding pt headache and repeat order of covid swab at this time. Awaiting new orders at this time.

## 2020-09-10 NOTE — ED Notes (Signed)
Nitro paste removed at this time. Pt states pain is almost gone now. BP 90/64

## 2020-09-10 NOTE — H&P (Signed)
Darren Rogers   PATIENT NAME: Darren Rogers    MR#:  [id number]  DATE OF BIRTH:  1955-11-20  DATE OF ADMISSION:  09/10/2020  PRIMARY CARE PHYSICIAN: Patient, No Pcp Per (Inactive)   Patient is coming from: Home  REQUESTING/REFERRING PHYSICIAN: Antoine Primas, MD CHIEF COMPLAINT:   Chief Complaint  Patient presents with  . Shortness of Breath    Presenting via EMS from home with CP and SOB starting yesterday at 5pm. Afib RVR on arrival HR 160s. Takes metoprolol for afib at home    HISTORY OF PRESENT ILLNESS:  Darren Rogers is a 65 y.o. African-American male with medical history significant for coronary artery disease, paroxysmal atrial fibrillation who has been off Xarelto for couple years for an episode of epistaxis, essential hypertension and tobacco abuse, who presented to the emergency room with acute onset of midsternal chest pain felt as a sharp pain and graded 10/10 in severity with radiation to his neck without nausea or vomiting or diaphoresis.  He admitted to dyspnea with this pain however and denies any cough or wheezing.  No leg pain or edema recent travels or surgeries.  He felt mild palpitations and thought he was in atrial fibrillation with RVR.  He denies any dysuria, oliguria or hematuria or flank pain.  No bleeding diathesis.  No dysuria, oliguria or hematuria or flank pain.  He admitted to having a problem affording Xarelto. ED Course: Upon presentation to the ER blood pressure was 89/68 with heart rate of 103 with respiratory to 24 and pulse symmetry was 99% on room air.  Blood pressure later has significant improved.  Heart rate was up to 151.  Labs revealed hypokalemia of 3.3 and magnesium was 2.3 with otherwise unremarkable CMP except for ALT 53.  High-sensitivity troponin I was 88 and later 91.  CBC was unremarkable and TSH was 2.27.  Alcohol level was 112. EKG as reviewed by me : Showed atrial fibrillation with RVR with a rate of 156 with PVC and repolarization  abnormality with T wave inversion inferolaterally. Imaging: Chest x-ray showed no acute cardiopulmonary disease.  The patient was given aspirin as well as 10 mg of IV Cardizem bolus followed by drip and 40 mcg p.o. potassium chloride in addition to IV heparin.  He will be admitted to progressive cardiac unit bed for further evaluation and management. PAST MEDICAL HISTORY:   Past Medical History:  Diagnosis Date  . Claudication (HCC)    a. 06/2015 ABI: R - 0.73, L - 0.73. 30-49% bilat SFA stenosis. 50-74% R Profunda stenosis; b. 01/2017 ABI: R 0.89, L 0.88, TBI R 0.65, L 1.0.  . Erectile dysfunction   . Essential hypertension   . History of echocardiogram    a. 03/29/2014: EF 55-60%, mild LVH, normal RVSP;  b. 05/2015 Echo: EF 60-65%, triv AI, mild MR; c. 07/2020 Echo:   Marland Kitchen Hyperlipidemia   . Non-obstructive CAD    a. 01/27/2014 Cath: LM nl, LAD mild diff dz w/o obs, LCx no sig obs - scattered 20-30% mLCx, RCA no obs dz, EF 70%; b. 06/2015 MV; EF 60%, no ischemia.  Marland Kitchen PAF (paroxysmal atrial fibrillation) (HCC)    a. Pt says Dx >65yrs ago w/ ? RFCA @ [facility];  b. Recurrent 12/2013;  b. Rx Flecainide and Xarelto-->Intermittent compliance.  Marland Kitchen PVC's (premature ventricular contractions)    a. 05/2017 Zio: 4000 PVCs in 48 hrs (2%). Brief run of SVT.  . Tobacco abuse    a. ongoing -  1 ppd.    PAST SURGICAL HISTORY:   Past Surgical History:  Procedure Laterality Date  . CARDIAC CATHETERIZATION    . CARDIAC CATHETERIZATION     [facility]  . LEFT HEART CATH Right 01/27/2014   Procedure: LEFT HEART CATH;  Surgeon: Micheline Chapman, MD;  Location: [facility] CATH LAB;  Service: Cardiovascular;  Laterality: Right;    SOCIAL HISTORY:   Social History   Tobacco Use  . Smoking status: Current Every Day Smoker    Packs/day: 0.50    Years: 30.00    Pack years: 15.00    Types: Cigarettes  . Smokeless tobacco: Never Used  Substance Use Topics  . Alcohol use: Yes    Alcohol/week: 12.0 standard drinks    Types: 12  Cans of beer per week    FAMILY HISTORY:   Family History  Problem Relation Age of Onset  . Coronary artery disease Father 13  . Diabetes Mother   . Diabetes Sister   . Diabetes Brother   . Diabetes Maternal Grandmother   . Diabetes Sister   . Diabetes Sister     DRUG ALLERGIES:  No Known Allergies  REVIEW OF SYSTEMS:   ROS As per history of present illness. All pertinent systems were reviewed above. Constitutional, HEENT, cardiovascular, respiratory, GI, GU, musculoskeletal, neuro, psychiatric, endocrine, integumentary and hematologic systems were reviewed and are otherwise negative/unremarkable except for positive findings mentioned above in the HPI.   MEDICATIONS AT HOME:   Prior to Admission medications   Medication Sig Start Date End Date Taking? Authorizing Provider  aspirin EC 81 MG tablet Take 1 tablet (81 mg total) by mouth daily. 08/16/19   Emily Filbert, MD  diltiazem (CARDIZEM CD) 240 MG 24 hr capsule Take 1 capsule (240 mg total) by mouth daily. 08/06/20   Wouk, Wilfred Curtis, MD  metoprolol succinate (TOPROL-XL) 100 MG 24 hr tablet Take 1 tablet (100 mg total) by mouth daily. Take with or immediately following a meal. 08/06/20   Wouk, Wilfred Curtis, MD  rivaroxaban (XARELTO) 20 MG TABS tablet Take 1 tablet (20 mg total) by mouth daily with supper. 08/06/20   Wouk, Wilfred Curtis, MD  rivaroxaban (XARELTO) 20 MG TABS tablet Take 1 tablet (20 mg total) by mouth daily with supper for 14 days. 08/06/20 08/20/20  Wouk, Wilfred Curtis, MD  rosuvastatin (CRESTOR) 20 MG tablet Take 1 tablet (20 mg total) by mouth daily for 14 days. 01/29/20 02/12/20  Delton Prairie, MD      VITAL SIGNS:  Blood pressure 100/71, pulse 71, temperature 97.7 F (36.5 C), temperature source Oral, resp. rate 16, height 5\' 6"  (1.676 m), weight 70.3 kg, SpO2 98 %.  PHYSICAL EXAMINATION:  Physical Exam  GENERAL:  65 y.o.-year-old African-American male patient lying in the bed with no acute distress.   EYES: Pupils equal, round, reactive to light and accommodation. No scleral icterus. Extraocular muscles intact.  HEENT: Head atraumatic, normocephalic. Oropharynx and nasopharynx clear.  NECK:  Supple, no jugular venous distention. No thyroid enlargement, no tenderness.  LUNGS: Normal breath sounds bilaterally, no wheezing, rales,rhonchi or crepitation. No use of accessory muscles of respiration.  CARDIOVASCULAR: Irregularly irregular tachycardic rhythm, S1, S2 normal. No murmurs, rubs, or gallops.  ABDOMEN: Soft, nondistended, nontender. Bowel sounds present. No organomegaly or mass.  EXTREMITIES: No pedal edema, cyanosis, or clubbing.  NEUROLOGIC: Cranial nerves II through XII are intact. Muscle strength 5/5 in all extremities. Sensation intact. Gait not checked.  PSYCHIATRIC: The patient is alert and  oriented x 3.  Normal affect and good eye contact. SKIN: No obvious rash, lesion, or ulcer.   LABORATORY PANEL:   CBC Recent Labs  Lab 09/10/20 0411  WBC 5.9  HGB 14.0  HCT 40.7  PLT 267   ------------------------------------------------------------------------------------------------------------------  Chemistries  Recent Labs  Lab 09/10/20 0411  NA 137  K 3.3*  CL 107  CO2 20*  GLUCOSE 98  BUN 12  CREATININE 1.18  CALCIUM 8.8*  MG 2.3  AST 38  ALT 53*  ALKPHOS 93  BILITOT 0.5   ------------------------------------------------------------------------------------------------------------------  Cardiac Enzymes No results for input(s): TROPONINI in the last 168 hours. ------------------------------------------------------------------------------------------------------------------  RADIOLOGY:  DG Chest Portable 1 View  Result Date: 09/10/2020 CLINICAL DATA:  Chest pain EXAM: PORTABLE CHEST 1 VIEW COMPARISON:  08/12/2020 FINDINGS: The lungs are symmetrically well expanded and are clear save for minimal linear scarring within the left mid lung zone. No pneumothorax or  pleural effusion. Cardiac size within normal limits. Pulmonary vascularity is normal. Multiple bilateral healed rib fractures are identified. No acute bone abnormality. IMPRESSION: No active disease. Electronically Signed   By: Helyn Numbers MD   On: 09/10/2020 04:49      IMPRESSION AND PLAN:  Active Problems:   Atrial fibrillation with RVR (HCC)  1.  Paroxysmal atrial fibrillation with RVR with associated chest pain like secondary an elevated troponin I. -The patient will be admitted to a progressive unit bed. -We will continue him on IV Cardizem drip as well as IV heparin drip. -He may need to be considered for p.o. Coumadin for anticoagulation. -We will follow serial troponin I.  Elevations likely secondary to demand ischemia from atrial fibrillation. -Cardiology consult will be obtained.. -I notified Dr. Graciela Husbands about the patient. -She had a recent 2D echo revealing an EF of 50 to 55% with mild LVH and mild left atrial dilatation, mild mitral regurgitation and tricuspid regurgitation.  2.  Hypokalemia. -Potassium was replaced and magnesium level collected normal.  3.  Essential hypertension. -Continue Toprol-XL.  4.  Coronary artery disease. -We will continue beta-blocker therapy as well as aspirin.  DVT prophylaxis: He will be on IV heparin. Code Status: full code. Family Communication:  The plan of care was discussed in details with the patient (and he requested no other family members to be notified at this time.). I answered all questions. The patient agreed to proceed with the above mentioned plan. Further management will depend upon hospital course. Disposition Plan: Back to previous home environment Consults called: Cardiac consult All the records are reviewed and case discussed with ED provider.  Status is: Inpatient  Remains inpatient appropriate because:Hemodynamically unstable, Ongoing diagnostic testing needed not appropriate for outpatient work up, Unsafe d/c  plan, IV treatments appropriate due to intensity of illness or inability to take PO and Inpatient level of care appropriate due to severity of illness   Dispo: The patient is from: Home              Anticipated d/c is to: Home              Patient currently is not medically stable to d/c.   Difficult to place patient No  TOTAL TIME TAKING CARE OF THIS PATIENT: 55 minutes.    Hannah Beat M.D on 09/10/2020 at 5:32 AM  [facility]   From 7 PM-7 AM, contact night-coverage [email or web address]  CC: Primary care physician; Patient, No Pcp Per (Inactive)

## 2020-09-11 LAB — CBC
HCT: 36.1 % — ABNORMAL LOW (ref 39.0–52.0)
Hemoglobin: 12.3 g/dL — ABNORMAL LOW (ref 13.0–17.0)
MCH: 31.7 pg (ref 26.0–34.0)
MCHC: 34.1 g/dL (ref 30.0–36.0)
MCV: 93 fL (ref 80.0–100.0)
Platelets: 238 10*3/uL (ref 150–400)
RBC: 3.88 MIL/uL — ABNORMAL LOW (ref 4.22–5.81)
RDW: 14.8 % (ref 11.5–15.5)
WBC: 5.5 10*3/uL (ref 4.0–10.5)
nRBC: 0 % (ref 0.0–0.2)

## 2020-09-11 LAB — BASIC METABOLIC PANEL
Anion gap: 6 (ref 5–15)
BUN: 15 mg/dL (ref 8–23)
CO2: 23 mmol/L (ref 22–32)
Calcium: 8.7 mg/dL — ABNORMAL LOW (ref 8.9–10.3)
Chloride: 108 mmol/L (ref 98–111)
Creatinine, Ser: 1.07 mg/dL (ref 0.61–1.24)
GFR, Estimated: >60 mL/min (ref >60)
Glucose, Bld: 95 mg/dL (ref 70–99)
Potassium: 4.4 mmol/L (ref 3.5–5.1)
Sodium: 137 mmol/L (ref 135–145)

## 2020-09-11 LAB — HEPARIN LEVEL (UNFRACTIONATED): Heparin Unfractionated: 0.53 IU/mL (ref 0.30–0.70)

## 2020-09-11 LAB — TROPONIN I (HIGH SENSITIVITY): Troponin I (High Sensitivity): 64 ng/L — ABNORMAL HIGH (ref <18)

## 2020-09-11 MED ORDER — METOPROLOL TARTRATE 25 MG PO TABS
25.0000 mg | ORAL_TABLET | Freq: Four times a day (QID) | ORAL | Status: DC
  Administered 2020-09-11 – 2020-09-12 (×5): 25 mg via ORAL
  Filled 2020-09-11 (×5): qty 1

## 2020-09-11 MED ORDER — DILTIAZEM HCL 30 MG PO TABS
60.0000 mg | ORAL_TABLET | Freq: Four times a day (QID) | ORAL | Status: DC
  Administered 2020-09-11 – 2020-09-12 (×4): 60 mg via ORAL
  Filled 2020-09-11 (×4): qty 2

## 2020-09-11 MED ORDER — APIXABAN 5 MG PO TABS
5.0000 mg | ORAL_TABLET | Freq: Two times a day (BID) | ORAL | Status: DC
  Administered 2020-09-11 – 2020-09-12 (×3): 5 mg via ORAL
  Filled 2020-09-11 (×3): qty 1

## 2020-09-11 NOTE — Progress Notes (Signed)
Progress Note  Patient Name: Darren Rogers Date of Encounter: 09/11/2020  Primary Cardiologist: Kirke Corin  Subjective   Brief episode of chest pain this morning with need to void. He remains in Afib with RVR with ventricular rates in the low 100s to 120s bpm, occasionally in the 140s bpm.    Inpatient Medications    Scheduled Meds: . apixaban  5 mg Oral BID  . aspirin EC  81 mg Oral Daily  . diltiazem      . diltiazem  60 mg Oral Q6H  . metoprolol tartrate  25 mg Oral QID  . rosuvastatin  20 mg Oral Daily   Continuous Infusions: . sodium chloride 75 mL/hr at 09/11/20 0400   PRN Meds: acetaminophen **OR** acetaminophen, magnesium hydroxide, ondansetron **OR** ondansetron (ZOFRAN) IV, traZODone   Vital Signs    Vitals:   09/10/20 1730 09/10/20 1840 09/10/20 2026 09/11/20 0549  BP: 104/89 (!) 118/96 117/82 (!) 134/98  Pulse: (!) 54 72 81 (!) 56  Resp: 19 20 19 17   Temp:  98.2 F (36.8 C) 98.3 F (36.8 C) 98.2 F (36.8 C)  TempSrc:  Oral Oral Oral  SpO2: 100% 100% 99% 100%  Weight:      Height:        Intake/Output Summary (Last 24 hours) at 09/11/2020 0900 Last data filed at 09/11/2020 0400 Gross per 24 hour  Intake 1080 ml  Output --  Net 1080 ml   Filed Weights   09/10/20 0409  Weight: 70.3 kg    Telemetry    Afib with RVR with ventricular rates in the low 100s to 120s with occasional rates into the 140s bpm - Personally Reviewed  ECG    No new tracings - Personally Reviewed  Physical Exam   GEN: No acute distress.   Neck: No JVD. Cardiac: Tachycardic, IRIR, no murmurs, rubs, or gallops.  Respiratory: Clear to auscultation bilaterally.  GI: Soft, nontender, non-distended.   MS: No edema; No deformity. Neuro:  Alert and oriented x 3; Nonfocal.  Psych: Normal affect.  Labs    Chemistry Recent Labs  Lab 09/10/20 0411 09/11/20 0302  NA 137 137  K 3.3* 4.4  CL 107 108  CO2 20* 23  GLUCOSE 98 95  BUN 12 15  CREATININE 1.18 1.07  CALCIUM  8.8* 8.7*  PROT 7.2  --   ALBUMIN 3.8  --   AST 38  --   ALT 53*  --   ALKPHOS 93  --   BILITOT 0.5  --   GFRNONAA >60 >60  ANIONGAP 10 6     Hematology Recent Labs  Lab 09/10/20 0411 09/11/20 0302  WBC 5.9 5.5  RBC 4.40 3.88*  HGB 14.0 12.3*  HCT 40.7 36.1*  MCV 92.5 93.0  MCH 31.8 31.7  MCHC 34.4 34.1  RDW 14.6 14.8  PLT 267 238    Cardiac EnzymesNo results for input(s): TROPONINI in the last 168 hours. No results for input(s): TROPIPOC in the last 168 hours.   BNPNo results for input(s): BNP, PROBNP in the last 168 hours.   DDimer No results for input(s): DDIMER in the last 168 hours.   Radiology    CT HEAD WO CONTRAST  Result Date: 09/10/2020 IMPRESSION: Mild atrophic changes without acute abnormality Electronically Signed   By: 11/10/2020 M.D.   On: 09/10/2020 08:31   DG Chest Portable 1 View  Result Date: 09/10/2020 IMPRESSION: No active disease. Electronically Signed   By: 11/10/2020  Ramiro Harvest MD   On: 09/10/2020 04:49    Cardiac Studies   2D echo 08/06/2020: 1. Left ventricular ejection fraction, by estimation, is 50 to 55%. The  left ventricle has low normal function. The left ventricle has no regional  wall motion abnormalities. There is mild left ventricular hypertrophy.  Left ventricular diastolic  parameters are indeterminate. The average left ventricular global  longitudinal strain is -8.7 %. The global longitudinal strain is abnormal.  2. Right ventricular systolic function is mildly reduced. The right  ventricular size is normal. Moderately increased right ventricular wall  thickness.  3. Left atrial size was mildly dilated.  4. The mitral valve is normal in structure. Mild mitral valve  regurgitation. No evidence of mitral stenosis.  5. The aortic valve is tricuspid. There is mild thickening of the aortic  valve. Aortic valve regurgitation is mild. Mild aortic valve sclerosis is  present, with no evidence of aortic valve stenosis.  6. The  inferior vena cava is normal in size with greater than 50%  respiratory variability, suggesting right atrial pressure of 3 mmHg.  __________  Eugenie Birks MPI 08/06/2020:  Normal pharmacologic myocardial perfusion stress test without evidence of significant ischemia or scar.  Grossly normal left ventricular systolic function by visual estimation. Calculated LVEF is unreliable due to gating issues related to atrial fibrillation.  Coronary artery calcification and aortic atherosclerosis are noted on the attenuation correction CT.  This is a low risk study.   Patient Profile     65 y.o. male with history of CAD, persistent A. fib noncompliant with medical therapy, polysubstance abuse, HTN, HLD, and ED who is being seen today for the evaluation of persistent A. fib with RVR at the request of Dr. Arville Care.  Assessment & Plan    1. Persistent Afib with RVR: -Likely exacerbated by medical nonadherence and alcohol use -Rhythm control strategy has been has been delayed in the context of being lost to follow up and medication noncompliance  -He remains in A. fib with RVR -Titrate short acting diltiazem to 60 mg q 6 hours -Change Toprol XL to Lopressor 25 mg qid for added rate control, consider consolidating back to Toprol prior to discharge for once daily dosing and improved medication compliance   -CHADS2VASc at least 3 (HTN, age x1, vascular disease) -We have consulted care manager to assist with determining cost for DOAC -For now, we will stop his heparin gtt and place him on Eliquis -He does not want to be placed on warfarin  -Patient overall has poor insight as he indicates he is frustrated that he remains in A. fib though does not take ownership of medical adherence and recommendations to abstain from substance abuse  2.  Elevated troponin: -No current symptoms of angina -Minimally elevated and not consistent with ACS likely in the setting of supply demand ischemia with A. fib with  RVR -Recent Lexiscan MPI showing no evidence of ischemia or scar and was low risk -No plans for inpatient ischemic evaluation -Recommend risk factor modification  3. Epistaxis: -Tolerating heparin gtt without issues currently  -If this redevelops, recommend he see ENT   4.  Hypokalemia: -Repleted  5.  Coronary artery calcification/aortic atherosclerosis/HLD: -LDL 131 this admission, possibly higher than last reading in the setting of medication noncompliance  -Continue PTA rosuvastatin -Needs outpatient follow-up  6.  HTN: -Blood pressure has been well controlled overall with most recent reading mildly elevated  -Continue current medical therapy as outlined above  7.  Polysubstance abuse: -Urine  drug screen is negative this admission with the patient last testing positive for cocaine in 08/2019 -He continues to drink alcohol -Complete cessation recommended  8. PAD: -Follow up as outpatient   For questions or updates, please contact [facility] Please consult [email or web address] for contact info under Cardiology/STEMI.    Signed, Eula Listen, PA-C [facility] Pager: [phone] 09/11/2020, 9:00 AM

## 2020-09-11 NOTE — Progress Notes (Addendum)
PROGRESS NOTE    Darren Rogers  QJJ:[id number] DOB: 29-Aug-1955 DOA: 09/10/2020 PCP: Patient, No Pcp Per (Inactive)  Brief Narrative: 65 year old male with history of hypertension, persistent A. fib, nonobstructive CAD, alcoholism and medication noncompliance admitted with A. fib RVR   Assessment & Plan:   Active Problems:   Atrial fibrillation with RVR (HCC) -Heart rate improving but persistently tachycardic still, Cardizem increased to 60 mg Q6h and Toprol switched to Lopressor 4 times daily -Recent echo was unremarkable, EF of 50-55%, mild LVH  -Hopefully can consolidate meds tomorrow -Started on Eliquis, would not be a good candidate for warfarin given history of noncompliance -Case management consulted for medication assistance  Hypokalemia -Replaced  CAD -Stable, continue aspirin, beta-blocker  Alcoholism -Counseled   DVT prophylaxis: Eliquis Code Status: Full code Family Communication: No family at bedside Disposition Plan:  Status is: Inpatient  Remains inpatient appropriate because:Inpatient level of care appropriate due to severity of illness   Dispo: The patient is from: Home              Anticipated d/c is to: Home              Patient currently is not medically stable to d/c.   Difficult to place patient No  Consultants:   Cardiology   Procedures:   Antimicrobials:    Subjective: -Feels okay overall, intermittent palpitations Objective: Vitals:   09/10/20 2026 09/11/20 0549 09/11/20 0919 09/11/20 1150  BP: 117/82 (!) 134/98 112/84 118/90  Pulse: 81 (!) 56 85 (!) 104  Resp: 19 17 18 18   Temp: 98.3 F (36.8 C) 98.2 F (36.8 C) 98.1 F (36.7 C) 98 F (36.7 C)  TempSrc: Oral Oral Oral Oral  SpO2: 99% 100% 97% 99%  Weight:      Height:        Intake/Output Summary (Last 24 hours) at 09/11/2020 1353 Last data filed at 09/11/2020 1151 Gross per 24 hour  Intake 1080 ml  Output 250 ml  Net 830 ml   Filed Weights   09/10/20 0409  Weight:  70.3 kg    Examination:  General exam: Pleasant male sitting up in bed, AAOx3, no distress CVS: S1-S2, irregularly irregular rhythm, tachycardic Lungs: Poor air movement bilaterally Abdomen: Soft, nontender, bowel sounds present Extremities: No edema Skin: No rash on exposed skin Psychiatry: Judgement and insight appear normal. Mood & affect appropriate.     Data Reviewed:   CBC: Recent Labs  Lab 09/10/20 0411 09/11/20 0302  WBC 5.9 5.5  NEUTROABS 2.1  --   HGB 14.0 12.3*  HCT 40.7 36.1*  MCV 92.5 93.0  PLT 267 238   Basic Metabolic Panel: Recent Labs  Lab 09/10/20 0411 09/11/20 0302  NA 137 137  K 3.3* 4.4  CL 107 108  CO2 20* 23  GLUCOSE 98 95  BUN 12 15  CREATININE 1.18 1.07  CALCIUM 8.8* 8.7*  MG 2.3  --    GFR: Estimated Creatinine Clearance: 62.1 mL/min (by C-G formula based on SCr of 1.07 mg/dL). Liver Function Tests: Recent Labs  Lab 09/10/20 0411  AST 38  ALT 53*  ALKPHOS 93  BILITOT 0.5  PROT 7.2  ALBUMIN 3.8   No results for input(s): LIPASE, AMYLASE in the last 168 hours. No results for input(s): AMMONIA in the last 168 hours. Coagulation Profile: Recent Labs  Lab 09/10/20 0514  INR 1.0   Cardiac Enzymes: No results for input(s): CKTOTAL, CKMB, CKMBINDEX, TROPONINI in the last 168 hours. BNP (last  3 results) No results for input(s): PROBNP in the last 8760 hours. HbA1C: No results for input(s): HGBA1C in the last 72 hours. CBG: No results for input(s): GLUCAP in the last 168 hours. Lipid Profile: Recent Labs    09/10/20 0607  CHOL 233*  HDL 47  LDLCALC 131*  TRIG 274*  CHOLHDL 5.0   Thyroid Function Tests: Recent Labs    09/10/20 0607  TSH 1.452   Anemia Panel: No results for input(s): VITAMINB12, FOLATE, FERRITIN, TIBC, IRON, RETICCTPCT in the last 72 hours. Urine analysis:    Component Value Date/Time   COLORURINE YELLOW (A) 11/16/2014 1255   APPEARANCEUR Clear 07/30/2016 1600   LABSPEC 1.021 11/16/2014 1255    LABSPEC 1.019 03/28/2014 1430   PHURINE 5.0 11/16/2014 1255   GLUCOSEU Negative 07/30/2016 1600   GLUCOSEU Negative 03/28/2014 1430   HGBUR NEGATIVE 11/16/2014 1255   BILIRUBINUR Negative 07/30/2016 1600   BILIRUBINUR Negative 03/28/2014 1430   KETONESUR NEGATIVE 11/16/2014 1255   PROTEINUR Negative 07/30/2016 1600   PROTEINUR NEGATIVE 11/16/2014 1255   NITRITE Negative 07/30/2016 1600   NITRITE NEGATIVE 11/16/2014 1255   LEUKOCYTESUR Negative 07/30/2016 1600   LEUKOCYTESUR Negative 03/28/2014 1430   Sepsis Labs: @LABRCNTIP (procalcitonin:4,lacticidven:4)  ) Recent Results (from the past 240 hour(s))  Resp Panel by RT-PCR (Flu A&B, Covid) Nasopharyngeal Swab     Status: None   Collection Time: 09/10/20  4:11 AM   Specimen: Nasopharyngeal Swab; Nasopharyngeal(NP) swabs in vial transport medium  Result Value Ref Range Status   SARS Coronavirus 2 by RT PCR NEGATIVE NEGATIVE Final    Comment: (NOTE) SARS-CoV-2 target nucleic acids are NOT DETECTED.  The SARS-CoV-2 RNA is generally detectable in upper respiratory specimens during the acute phase of infection. The lowest concentration of SARS-CoV-2 viral copies this assay can detect is 138 copies/mL. A negative result does not preclude SARS-Cov-2 infection and should not be used as the sole basis for treatment or other patient management decisions. A negative result may occur with  improper specimen collection/handling, submission of specimen other than nasopharyngeal swab, presence of viral mutation(s) within the areas targeted by this assay, and inadequate number of viral copies(<138 copies/mL). A negative result must be combined with clinical observations, patient history, and epidemiological information. The expected result is Negative.  Fact Sheet for Patients:  11/10/20  Fact Sheet for Healthcare Providers:  BloggerCourse.com  This test is no t yet approved or  cleared by the [place] FDA and  has been authorized for detection and/or diagnosis of SARS-CoV-2 by FDA under an Emergency Use Authorization (EUA). This EUA will remain  in effect (meaning this test can be used) for the duration of the COVID-19 declaration under Section 564(b)(1) of the Act, 21 U.S.C.section 360bbb-3(b)(1), unless the authorization is terminated  or revoked sooner.       Influenza A by PCR NEGATIVE NEGATIVE Final   Influenza B by PCR NEGATIVE NEGATIVE Final    Comment: (NOTE) The Xpert Xpress SARS-CoV-2/FLU/RSV plus assay is intended as an aid in the diagnosis of influenza from Nasopharyngeal swab specimens and should not be used as a sole basis for treatment. Nasal washings and aspirates are unacceptable for Xpert Xpress SARS-CoV-2/FLU/RSV testing.  Fact Sheet for Patients: [place]  Fact Sheet for Healthcare Providers: BloggerCourse.com  This test is not yet approved or cleared by the [place] FDA and has been authorized for detection and/or diagnosis of SARS-CoV-2 by FDA under an Emergency Use Authorization (EUA). This EUA will remain in effect (meaning  this test can be used) for the duration of the COVID-19 declaration under Section 564(b)(1) of the Act, 21 U.S.C. section 360bbb-3(b)(1), unless the authorization is terminated or revoked.  Performed at [facility] , [address] [place], [place] [address]          Radiology Studies: CT HEAD WO CONTRAST  Result Date: 09/10/2020 CLINICAL DATA:  Headaches EXAM: CT HEAD WITHOUT CONTRAST TECHNIQUE: Contiguous axial images were obtained from the base of the skull through the vertex without intravenous contrast. COMPARISON:  11/16/2014 FINDINGS: Brain: No evidence of acute infarction, hemorrhage, hydrocephalus, extra-axial collection or mass lesion/mass effect. Mild atrophic changes are noted. Vascular: No hyperdense vessel or  unexpected calcification. Skull: Normal. Negative for fracture or focal lesion. Sinuses/Orbits: No acute finding. Other: None. IMPRESSION: Mild atrophic changes without acute abnormality Electronically Signed   By: Alcide Clever M.D.   On: 09/10/2020 08:31   DG Chest Portable 1 View  Result Date: 09/10/2020 CLINICAL DATA:  Chest pain EXAM: PORTABLE CHEST 1 VIEW COMPARISON:  08/12/2020 FINDINGS: The lungs are symmetrically well expanded and are clear save for minimal linear scarring within the left mid lung zone. No pneumothorax or pleural effusion. Cardiac size within normal limits. Pulmonary vascularity is normal. Multiple bilateral healed rib fractures are identified. No acute bone abnormality. IMPRESSION: No active disease. Electronically Signed   By: Helyn Numbers MD   On: 09/10/2020 04:49        Scheduled Meds: . apixaban  5 mg Oral BID  . aspirin EC  81 mg Oral Daily  . diltiazem  60 mg Oral Q6H  . metoprolol tartrate  25 mg Oral QID  . rosuvastatin  20 mg Oral Daily   Continuous Infusions: . sodium chloride 75 mL/hr at 09/11/20 0400     LOS: 1 day    Time spent:   [facility]  09/11/2020, 1:53 PM

## 2020-09-11 NOTE — Progress Notes (Signed)
ANTICOAGULATION CONSULT NOTE   Pharmacy Consult for heparin infusion Indication: ACS/STEMI  No Known Allergies  Patient Measurements: Height: 5\' 6"  (167.6 cm) Weight: 70.3 kg (155 lb) IBW/kg (Calculated) : 63.8 Heparin Dosing Weight: 70.3 kg  Vital Signs: Temp: 98.3 F (36.8 C) (04/12 2026) Temp Source: Oral (04/12 2026) BP: 117/82 (04/12 2026) Pulse Rate: 81 (04/12 2026)  Labs: Recent Labs    09/10/20 0411 09/10/20 0514 09/10/20 0607 09/10/20 1907 09/11/20 0302  HGB 14.0  --   --   --  12.3*  HCT 40.7  --   --   --  36.1*  PLT 267  --   --   --  238  APTT  --  29  --   --   --   LABPROT  --  12.9  --   --   --   INR  --  1.0  --   --   --   HEPARINUNFRC  --  <0.10*  --  0.80* 0.53  CREATININE 1.18  --   --   --  1.07  TROPONINIHS 88*  --  91*  --   --     Estimated Creatinine Clearance: 62.1 mL/min (by C-G formula based on SCr of 1.07 mg/dL).   Medical History: Past Medical History:  Diagnosis Date  . Claudication (HCC)    a. 06/2015 ABI: R - 0.73, L - 0.73. 30-49% bilat SFA stenosis. 50-74% R Profunda stenosis; b. 01/2017 ABI: R 0.89, L 0.88, TBI R 0.65, L 1.0.  . Erectile dysfunction   . Essential hypertension   . History of echocardiogram    a. 03/29/2014: EF 55-60%, mild LVH, normal RVSP;  b. 05/2015 Echo: EF 60-65%, triv AI, mild MR; c. 07/2020 Echo:   08/2020 Hyperlipidemia   . Non-obstructive CAD    a. 01/27/2014 Cath: LM nl, LAD mild diff dz w/o obs, LCx no sig obs - scattered 20-30% mLCx, RCA no obs dz, EF 70%; b. 06/2015 MV; EF 60%, no ischemia.  07/2015 PAF (paroxysmal atrial fibrillation) (HCC)    a. Pt says Dx >68yrs ago w/ ? RFCA @ [facility];  b. Recurrent 12/2013;  b. Rx Flecainide and Xarelto-->Intermittent compliance.  01/2014 PVC's (premature ventricular contractions)    a. 05/2017 Zio: 4000 PVCs in 48 hrs (2%). Brief run of SVT.  . Tobacco abuse    a. ongoing - 1 ppd.    Medications:  PTA Meds:  Xarelto 20 mg -- not taking  Assessment: Pt is 65 yo male w/ h/o  CAD, parAFib (off Xarelto for couple years for an episode of epistaxis), HTN and tobacco abuse presenting now with acute onset of midsternal chest pain felt as a sharp pain and graded 10/10 in severity with radiation to his neck. Pt was initially started onHeparin for ACS/STEMI, but held 4/12 ~0800 ISO concerning headache, CT head was negative for acute abnormalities, and pharmacy has been consulted for the continue mgmt of hep gtt.  Baseline labs corroborate no DOAC use PTA (HL <0.1). APTT 29s, INR 1.0, H/H/Plts WNL Will CTM for transition to DOAC per pt affordability discussion ongoing with patient.   Date Time HL Rate/Comment 4/12 0600 -- Bolus 4000 un; then started at 900 un/hr 4/12 0800 -- Hep gtt held ISO concerning headache, CT neg; Resume>> 4/12 1700  __ 900 un/hr 4/12 1907 0.80 Supratherapeutic  4/13     0302    0.53     Therapeutic x 1  Goal of Therapy:  Heparin level 0.3-0.7 units/ml Monitor platelets by anticoagulation protocol: Yes     Plan:   HL is therapeutic. Will continue infusion at 800 units/hr.  Will check HL in 6 hours for confirmation  CBC & HL daily while on heparin.  Clovia Cuff, PharmD, BCPS 09/11/2020 3:42 AM

## 2020-09-12 DIAGNOSIS — F1010 Alcohol abuse, uncomplicated: Secondary | ICD-10-CM

## 2020-09-12 DIAGNOSIS — I4891 Unspecified atrial fibrillation: Secondary | ICD-10-CM

## 2020-09-12 DIAGNOSIS — R0789 Other chest pain: Secondary | ICD-10-CM

## 2020-09-12 LAB — BASIC METABOLIC PANEL
Anion gap: 8 (ref 5–15)
BUN: 14 mg/dL (ref 8–23)
CO2: 23 mmol/L (ref 22–32)
Calcium: 9 mg/dL (ref 8.9–10.3)
Chloride: 107 mmol/L (ref 98–111)
Creatinine, Ser: 1.01 mg/dL (ref 0.61–1.24)
GFR, Estimated: >60 mL/min (ref >60)
Glucose, Bld: 98 mg/dL (ref 70–99)
Potassium: 4.2 mmol/L (ref 3.5–5.1)
Sodium: 138 mmol/L (ref 135–145)

## 2020-09-12 LAB — D-DIMER, QUANTITATIVE: D-Dimer, Quant: 0.83 ug/mL-FEU — ABNORMAL HIGH (ref 0.00–0.50)

## 2020-09-12 MED ORDER — DILTIAZEM HCL ER COATED BEADS 120 MG PO CP24
240.0000 mg | ORAL_CAPSULE | Freq: Every day | ORAL | Status: DC
  Administered 2020-09-12: 240 mg via ORAL
  Filled 2020-09-12: qty 2

## 2020-09-12 MED ORDER — METOPROLOL SUCCINATE ER 100 MG PO TB24: 100.0000 mg | ORAL_TABLET | Freq: Every day | ORAL | 1 refills | Status: DC

## 2020-09-12 MED ORDER — METOPROLOL SUCCINATE ER 100 MG PO TB24
100.0000 mg | ORAL_TABLET | Freq: Every day | ORAL | Status: DC
  Administered 2020-09-12: 100 mg via ORAL
  Filled 2020-09-12: qty 1

## 2020-09-12 MED ORDER — APIXABAN 5 MG PO TABS: 5.0000 mg | ORAL_TABLET | Freq: Two times a day (BID) | ORAL | 0 refills | Status: DC

## 2020-09-12 MED ORDER — ALUM & MAG HYDROXIDE-SIMETH 200-200-20 MG/5ML PO SUSP
15.0000 mL | Freq: Four times a day (QID) | ORAL | Status: DC | PRN
  Administered 2020-09-12: 15 mL via ORAL
  Filled 2020-09-12: qty 30

## 2020-09-12 MED ORDER — ROSUVASTATIN CALCIUM 20 MG PO TABS: 20.0000 mg | ORAL_TABLET | Freq: Every day | ORAL | 0 refills | Status: DC

## 2020-09-12 MED ORDER — DILTIAZEM HCL ER COATED BEADS 240 MG PO CP24: 240.0000 mg | ORAL_CAPSULE | Freq: Every day | ORAL | 1 refills | Status: DC

## 2020-09-12 NOTE — Progress Notes (Signed)
Mobility Specialist - Progress Note   09/12/20 1400  Mobility  Activity Ambulated in room  Level of Assistance Independent  Assistive Device None  Distance Ambulated (ft) 100 ft  Mobility Response Tolerated well  Mobility performed by Mobility specialist  $Mobility charge 1 Mobility    Pt ambulated in room with independence. No SOB on RA, O2 maintained high 90s. Independent.    Filiberto Pinks Mobility Specialist 09/12/20, 3:01 PM

## 2020-09-12 NOTE — Discharge Summary (Signed)
Physician Discharge Summary  Darren Rogers HAF:[id number] DOB: 04/07/1956 DOA: 09/10/2020  PCP: Patient, No Pcp Per (Inactive)  Admit date: 09/10/2020 Discharge date: 09/12/2020  Time spent: 35 minutes  Recommendations for Outpatient Follow-up:  PCP in 1 week Cardiology Dr.[name] in 2 weeks   Discharge Diagnoses:  Active Problems:   Chest pain   Atrial fibrillation with RVR (HCC)   Demand ischemia (HCC)   Polysubstance abuse (HCC)   Hypokalemia   Alcoholism  Discharge Condition: stable  Diet recommendation: heart healthy  Filed Weights   09/10/20 0409 09/12/20 0556  Weight: 70.3 kg 72.4 kg    History of present illness:  65 year old male with history of hypertension, persistent A. fib, nonobstructive CAD, alcoholism and medication noncompliance admitted with A. fib RVR   Hospital Course:    Atrial fibrillation with RVR (HCC) -Recent echo was unremarkable, EF of 50-55%, mild LVH  -Treated with Cardizem drip initially and then transition to oral Cardizem along with Toprol, dose increased to Cardizem CD 240 mg daily and Toprol 100 mg daily -He was previously on Xarelto however he was not taking this as he could not afford the co-pay -Started on Eliquis, seen by case manager provided with medication assistance so he would be able to afford this at a very low price for the next 2 years at least -Discharged home in a stable condition, follow-up with cardiology Dr.[name] in 2 weeks  Hypokalemia -Replaced  CAD -Stable, continue aspirin, beta-blocker  Alcoholism -Counseled  Discharge Exam: Vitals:   09/12/20 0810 09/12/20 1247  BP: 108/62 122/76  Pulse: 84 84  Resp: 18 18  Temp: 98.8 F (37.1 C) 97.8 F (36.6 C)  SpO2: 99% 99%    General: AAOx3 Cardiovascular: S1S2/RRR Respiratory: CTAB  Discharge Instructions   Discharge Instructions    Diet - low sodium heart healthy   Complete by: As directed    Increase activity slowly   Complete by: As directed       Allergies as of 09/12/2020   No Known Allergies     Medication List    STOP taking these medications   aspirin EC 81 MG tablet   rivaroxaban 20 MG Tabs tablet Commonly known as: XARELTO     TAKE these medications   apixaban 5 MG Tabs tablet Commonly known as: ELIQUIS Take 1 tablet (5 mg total) by mouth 2 (two) times daily.   diltiazem 240 MG 24 hr capsule Commonly known as: CARDIZEM CD Take 1 capsule (240 mg total) by mouth daily.   metoprolol succinate 100 MG 24 hr tablet Commonly known as: TOPROL-XL Take 1 tablet (100 mg total) by mouth daily. Take with or immediately following a meal.   rosuvastatin 20 MG tablet Commonly known as: Crestor Take 1 tablet (20 mg total) by mouth daily for 14 days.      No Known Allergies  Follow-up Information    Iran Ouch, MD. Go on 09/30/2020.   Specialty: Cardiology Why: @ 11:00am Contact information: [address] [place] [address] [phone]                The results of significant diagnostics from this hospitalization (including imaging, microbiology, ancillary and laboratory) are listed below for reference.    Significant Diagnostic Studies: CT HEAD WO CONTRAST  Result Date: 09/10/2020 CLINICAL DATA:  Headaches EXAM: CT HEAD WITHOUT CONTRAST TECHNIQUE: Contiguous axial images were obtained from the base of the skull through the vertex without intravenous contrast. COMPARISON:  11/16/2014  FINDINGS: Brain: No evidence of acute infarction, hemorrhage, hydrocephalus, extra-axial collection or mass lesion/mass effect. Mild atrophic changes are noted. Vascular: No hyperdense vessel or unexpected calcification. Skull: Normal. Negative for fracture or focal lesion. Sinuses/Orbits: No acute finding. Other: None. IMPRESSION: Mild atrophic changes without acute abnormality Electronically Signed   By: Alcide Clever M.D.   On: 09/10/2020 08:31   DG Chest Portable 1 View  Result Date:  09/10/2020 CLINICAL DATA:  Chest pain EXAM: PORTABLE CHEST 1 VIEW COMPARISON:  08/12/2020 FINDINGS: The lungs are symmetrically well expanded and are clear save for minimal linear scarring within the left mid lung zone. No pneumothorax or pleural effusion. Cardiac size within normal limits. Pulmonary vascularity is normal. Multiple bilateral healed rib fractures are identified. No acute bone abnormality. IMPRESSION: No active disease. Electronically Signed   By: Helyn Numbers MD   On: 09/10/2020 04:49    Microbiology: Recent Results (from the past 240 hour(s))  Resp Panel by RT-PCR (Flu A&B, Covid) Nasopharyngeal Swab     Status: None   Collection Time: 09/10/20  4:11 AM   Specimen: Nasopharyngeal Swab; Nasopharyngeal(NP) swabs in vial transport medium  Result Value Ref Range Status   SARS Coronavirus 2 by RT PCR NEGATIVE NEGATIVE Final    Comment: (NOTE) SARS-CoV-2 target nucleic acids are NOT DETECTED.  The SARS-CoV-2 RNA is generally detectable in upper respiratory specimens during the acute phase of infection. The lowest concentration of SARS-CoV-2 viral copies this assay can detect is 138 copies/mL. A negative result does not preclude SARS-Cov-2 infection and should not be used as the sole basis for treatment or other patient management decisions. A negative result may occur with  improper specimen collection/handling, submission of specimen other than nasopharyngeal swab, presence of viral mutation(s) within the areas targeted by this assay, and inadequate number of viral copies(<138 copies/mL). A negative result must be combined with clinical observations, patient history, and epidemiological information. The expected result is Negative.  Fact Sheet for Patients:  BloggerCourse.com  Fact Sheet for Healthcare Providers:  [employer]ious[employer]ker.it  This test is no t yet approved or cleared by the [place] FDA and  has been authorized  for detection and/or diagnosis of SARS-CoV-2 by FDA under an Emergency Use Authorization (EUA). This EUA will remain  in effect (meaning this test can be used) for the duration of the COVID-19 declaration under Section 564(b)(1) of the Act, 21 U.S.C.section 360bbb-3(b)(1), unless the authorization is terminated  or revoked sooner.       Influenza A by PCR NEGATIVE NEGATIVE Final   Influenza B by PCR NEGATIVE NEGATIVE Final    Comment: (NOTE) The Xpert Xpress SARS-CoV-2/FLU/RSV plus assay is intended as an aid in the diagnosis of influenza from Nasopharyngeal swab specimens and should not be used as a sole basis for treatment. Nasal washings and aspirates are unacceptable for Xpert Xpress SARS-CoV-2/FLU/RSV testing.  Fact Sheet for Patients: BloggerCourse.com  Fact Sheet for Healthcare Providers: [employer]ious[employer]ker.it  This test is not yet approved or cleared by the [place] FDA and has been authorized for detection and/or diagnosis of SARS-CoV-2 by FDA under an Emergency Use Authorization (EUA). This EUA will remain in effect (meaning this test can be used) for the duration of the COVID-19 declaration under Section 564(b)(1) of the Act, 21 U.S.C. section 360bbb-3(b)(1), unless the authorization is terminated or revoked.  Performed at [facility], [address] [place], [place] [address]      Labs: Basic Metabolic Panel: Recent Labs  Lab 09/10/20  0411 09/11/20 0302 09/12/20 0625  NA 137 137 138  K 3.3* 4.4 4.2  CL 107 108 107  CO2 20* 23 23  GLUCOSE 98 95 98  BUN 12 15 14   CREATININE 1.18 1.07 1.01  CALCIUM 8.8* 8.7* 9.0  MG 2.3  --   --    Liver Function Tests: Recent Labs  Lab 09/10/20 0411  AST 38  ALT 53*  ALKPHOS 93  BILITOT 0.5  PROT 7.2  ALBUMIN 3.8   No results for input(s): LIPASE, AMYLASE in the last 168 hours. No results for input(s): AMMONIA in the last 168 hours. CBC: Recent  Labs  Lab 09/10/20 0411 09/11/20 0302  WBC 5.9 5.5  NEUTROABS 2.1  --   HGB 14.0 12.3*  HCT 40.7 36.1*  MCV 92.5 93.0  PLT 267 238   Cardiac Enzymes: No results for input(s): CKTOTAL, CKMB, CKMBINDEX, TROPONINI in the last 168 hours. BNP: BNP (last 3 results) Recent Labs    08/05/20 0420 08/12/20 0740  BNP 93.2 185.1*    ProBNP (last 3 results) No results for input(s): PROBNP in the last 8760 hours.  CBG: No results for input(s): GLUCAP in the last 168 hours.     Signed:  08/14/20 MD.  [facility] 09/12/2020, 4:20 PM

## 2020-09-12 NOTE — Progress Notes (Signed)
Discharge instructions explained/pt verbalized understanding. IV and tele removed. Taxi called by Case management/voucher given to pt. Will transport off unit via wheelchair when ride arrives.

## 2020-09-12 NOTE — Progress Notes (Signed)
Progress Note  Patient Name: Darren Rogers Date of Encounter: 09/12/2020  Primary Cardiologist: Lorine Bears, MD   Subjective   Reports an episode of chest pain that he notified internal medicine regarding earlier today.  He states that his chest pain is pleuritic.  He is concerned that this chest pain is occurring and is wondering if he should stay longer. He states that IM performed an EKG in response to his report.  No current racing heart rate or palpitations.  Is not breathing deeply, he does not have chest pain.  He denies any associated shortness of breath.  No dizziness.  Inpatient Medications    Scheduled Meds: . apixaban  5 mg Oral BID  . aspirin EC  81 mg Oral Daily  . diltiazem  60 mg Oral Q6H  . metoprolol tartrate  25 mg Oral QID  . rosuvastatin  20 mg Oral Daily   Continuous Infusions:  PRN Meds: acetaminophen **OR** acetaminophen, alum & mag hydroxide-simeth, magnesium hydroxide, ondansetron **OR** ondansetron (ZOFRAN) IV, traZODone   Vital Signs    Vitals:   09/11/20 1947 09/12/20 0026 09/12/20 0556 09/12/20 0810  BP: (!) 115/91 112/90 (!) 122/95 108/62  Pulse: 77 61 72 84  Resp: 18 18 18 18   Temp: 97.9 F (36.6 C) 98.5 F (36.9 C) 97.8 F (36.6 C) 98.8 F (37.1 C)  TempSrc: Oral Oral Oral Oral  SpO2: 99% 99% 97% 99%  Weight:   72.4 kg   Height:        Intake/Output Summary (Last 24 hours) at 09/12/2020 1238 Last data filed at 09/12/2020 0945 Gross per 24 hour  Intake 940 ml  Output 1725 ml  Net -785 ml   Last 3 Weights 09/12/2020 09/10/2020 08/12/2020  Weight (lbs) 159 lb 9.6 oz 155 lb 155 lb  Weight (kg) 72.394 kg 70.308 kg 70.308 kg      Telemetry    Afib with ventricular rates 80-90s - Personally Reviewed  ECG    Atrial fibrillation, ventricular rate 78 bpm, poor R wave progression and TWI III, V5, nonspecific T wave abnormality, hyperacute P waves V2 to V3 seen 08/12/20- Personally Reviewed  Physical Exam   GEN: No acute distress.    Neck: No JVD Cardiac: IRIR, no murmurs, rubs, or gallops.  Respiratory: Clear to auscultation bilaterally. GI: Soft, nontender, non-distended  MS: No edema; No deformity. Neuro:  Nonfocal  Psych: Normal affect   Labs    High Sensitivity Troponin:   Recent Labs  Lab 09/10/20 0411 09/10/20 0607 09/11/20 0607  TROPONINIHS 88* 91* 64*      Chemistry Recent Labs  Lab 09/10/20 0411 09/11/20 0302 09/12/20 0625  NA 137 137 138  K 3.3* 4.4 4.2  CL 107 108 107  CO2 20* 23 23  GLUCOSE 98 95 98  BUN 12 15 14   CREATININE 1.18 1.07 1.01  CALCIUM 8.8* 8.7* 9.0  PROT 7.2  --   --   ALBUMIN 3.8  --   --   AST 38  --   --   ALT 53*  --   --   ALKPHOS 93  --   --   BILITOT 0.5  --   --   GFRNONAA >60 >60 >60  ANIONGAP 10 6 8      Hematology Recent Labs  Lab 09/10/20 0411 09/11/20 0302  WBC 5.9 5.5  RBC 4.40 3.88*  HGB 14.0 12.3*  HCT 40.7 36.1*  MCV 92.5 93.0  MCH 31.8 31.7  MCHC 34.4  34.1  RDW 14.6 14.8  PLT 267 238    BNPNo results for input(s): BNP, PROBNP in the last 168 hours.   DDimer  Recent Labs  Lab 09/12/20 1009  DDIMER 0.83*     Radiology    No results found.  Cardiac Studies   2D echo 08/06/2020: 1. Left ventricular ejection fraction, by estimation, is 50 to 55%. The  left ventricle has low normal function. The left ventricle has no regional  wall motion abnormalities. There is mild left ventricular hypertrophy.  Left ventricular diastolic  parameters are indeterminate. The average left ventricular global  longitudinal strain is -8.7 %. The global longitudinal strain is abnormal.  2. Right ventricular systolic function is mildly reduced. The right  ventricular size is normal. Moderately increased right ventricular wall  thickness.  3. Left atrial size was mildly dilated.  4. The mitral valve is normal in structure. Mild mitral valve  regurgitation. No evidence of mitral stenosis.  5. The aortic valve is tricuspid. There is mild  thickening of the aortic  valve. Aortic valve regurgitation is mild. Mild aortic valve sclerosis is  present, with no evidence of aortic valve stenosis.  6. The inferior vena cava is normal in size with greater than 50%  respiratory variability, suggesting right atrial pressure of 3 mmHg.  __________  Eugenie Birks MPI 08/06/2020:  Normal pharmacologic myocardial perfusion stress test without evidence of significant ischemia or scar.  Grossly normal left ventricular systolic function by visual estimation. Calculated LVEF is unreliable due to gating issues related to atrial fibrillation.  Coronary artery calcification and aortic atherosclerosis are noted on the attenuation correction CT.  This is a low risk study.   Patient Profile     65 y.o. male with history of CAD, persistent atrial fibrillation, noncompliance with medical therapy, polysubstance use, hypertension, hyperlipidemia, ED, and who is being seen today for persistent atrial fibrillation with RVR.  Assessment & Plan    Persistent atrial fibrillation with RVR  -- Reports shortness of breath and chest pain usually occurs when his ventricular rate is elevated.  It is thought that his atrial fibrillation is likely exacerbated by medical nonadherence and alcohol use.  He remains in atrial fibrillation with ventricular rate well controlled today.  He denies any racing heart rate or palpitations but does note chest pain with deeper breathing.  On review of EMR, he does have a history of CP with his Afib, usually when the ventricular rate is poorly controlled. He states this CP is different and reports that internal medicine says they will order a chest CT.  -- Ventricular rate well controlled. Short acting diltiazem consolidated to long-acting diltiazem to 40 mg daily today and BB to Toprol 100mg  daily earlier today.  -- Continue anticoagulation with Eliquis 5 mg twice daily. CHA2DS2VASc score of at least  3 (HTN, age, vascular).   Chest  pain with elevated troponin  History of CAD  --Reports pleuritic chest pain.  No CP if not breathing deeply. High-sensitivity troponin peaked in 60s.  With report of chest pain, repeat EKG was performed by IM as above with Afib and shows hyperacute TW as above.  Will discuss with MD. Consider repeat limited echo to reassess EF, WM,  and rule out right heart strain. Previous EF mildly reduced at 50-55%. He does have history of CAD and thus further risk stratification is indicated (pending CT). Per pt, IM is going to order a chest CT given his pain is pleuritic. Ddimer just ordered and  elevated at 0.83. Will defer to IM / await for results of chest CT with further recommendations at that time.   HLD --LDL 131. Continue statin.   HTN --BP controlled. Continue current medications.   Polysubstance use --Cessation recommended.   For questions or updates, please contact [facility] Please consult [email or web address] for contact info under        Signed, Lennon Alstrom, PA-C  09/12/2020, 12:38 PM

## 2020-09-12 NOTE — TOC Progression Note (Signed)
Transition of Care [facility]) - Progression Note    Patient Details  Name: Darren Rogers MRN: [id number] Date of Birth: 05-05-1956  Transition of Care [facility]) CM/SW Contact  Hetty Ely, RN Phone Number: 09/12/2020, 1:46 PM  Clinical Narrative: Spoke with patient who is pending discharge today, if labs are normal. Patient given coupons for Eliquis, the first one is for 30day supply free and the second one is for 84mo support for $10. Patient given instruction on use voices understanding. Patient also says he need a ride home, I will arrange taxi transport.           Expected Discharge Plan and Services                                                 Social Determinants of Health (SDOH) Interventions    Readmission Risk Interventions No flowsheet data found.

## 2020-09-16 ENCOUNTER — Other Ambulatory Visit: Payer: Self-pay | Admitting: Obstetrics and Gynecology

## 2020-09-24 ENCOUNTER — Emergency Department: Payer: Medicare Other

## 2020-09-24 ENCOUNTER — Other Ambulatory Visit: Payer: Self-pay

## 2020-09-24 ENCOUNTER — Observation Stay
Admission: EM | Admit: 2020-09-24 | Discharge: 2020-09-24 | Disposition: A | Discharge status: Home / Self Care | Payer: Medicare Other | Attending: Internal Medicine | Admitting: Internal Medicine

## 2020-09-24 DIAGNOSIS — F1910 Other psychoactive substance abuse, uncomplicated: Secondary | ICD-10-CM | POA: Insufficient documentation

## 2020-09-24 DIAGNOSIS — Z79899 Other long term (current) drug therapy: Secondary | ICD-10-CM | POA: Diagnosis not present

## 2020-09-24 DIAGNOSIS — I4891 Unspecified atrial fibrillation: Secondary | ICD-10-CM

## 2020-09-24 DIAGNOSIS — I2510 Atherosclerotic heart disease of native coronary artery without angina pectoris: Secondary | ICD-10-CM | POA: Diagnosis not present

## 2020-09-24 DIAGNOSIS — Z20822 Contact with and (suspected) exposure to covid-19: Secondary | ICD-10-CM | POA: Diagnosis not present

## 2020-09-24 DIAGNOSIS — E785 Hyperlipidemia, unspecified: Secondary | ICD-10-CM

## 2020-09-24 DIAGNOSIS — F17210 Nicotine dependence, cigarettes, uncomplicated: Secondary | ICD-10-CM | POA: Insufficient documentation

## 2020-09-24 DIAGNOSIS — Z7901 Long term (current) use of anticoagulants: Secondary | ICD-10-CM | POA: Diagnosis not present

## 2020-09-24 DIAGNOSIS — R079 Chest pain, unspecified: Secondary | ICD-10-CM | POA: Diagnosis not present

## 2020-09-24 DIAGNOSIS — I480 Paroxysmal atrial fibrillation: Principal | ICD-10-CM | POA: Insufficient documentation

## 2020-09-24 DIAGNOSIS — I10 Essential (primary) hypertension: Secondary | ICD-10-CM | POA: Diagnosis not present

## 2020-09-24 LAB — CBC
HCT: 38 % — ABNORMAL LOW (ref 39.0–52.0)
Hemoglobin: 13.3 g/dL (ref 13.0–17.0)
MCH: 31.6 pg (ref 26.0–34.0)
MCHC: 35 g/dL (ref 30.0–36.0)
MCV: 90.3 fL (ref 80.0–100.0)
Platelets: 259 10*3/uL (ref 150–400)
RBC: 4.21 MIL/uL — ABNORMAL LOW (ref 4.22–5.81)
RDW: 14.4 % (ref 11.5–15.5)
WBC: 4.8 10*3/uL (ref 4.0–10.5)
nRBC: 0 % (ref 0.0–0.2)

## 2020-09-24 LAB — COMPREHENSIVE METABOLIC PANEL
ALT: 24 U/L (ref 0–44)
AST: 25 U/L (ref 15–41)
Albumin: 4.3 g/dL (ref 3.5–5.0)
Alkaline Phosphatase: 88 U/L (ref 38–126)
Anion gap: 11 (ref 5–15)
BUN: 12 mg/dL (ref 8–23)
CO2: 22 mmol/L (ref 22–32)
Calcium: 9.4 mg/dL (ref 8.9–10.3)
Chloride: 104 mmol/L (ref 98–111)
Creatinine, Ser: 1.19 mg/dL (ref 0.61–1.24)
GFR, Estimated: >60 mL/min (ref >60)
Glucose, Bld: 77 mg/dL (ref 70–99)
Potassium: 4 mmol/L (ref 3.5–5.1)
Sodium: 137 mmol/L (ref 135–145)
Total Bilirubin: 0.5 mg/dL (ref 0.3–1.2)
Total Protein: 7.9 g/dL (ref 6.5–8.1)

## 2020-09-24 LAB — RESP PANEL BY RT-PCR (FLU A&B, COVID) ARPGX2
Influenza A by PCR: NEGATIVE
Influenza B by PCR: NEGATIVE
SARS Coronavirus 2 by RT PCR: NEGATIVE

## 2020-09-24 LAB — CBC WITH DIFFERENTIAL/PLATELET
Abs Immature Granulocytes: 0.01 10*3/uL (ref 0.00–0.07)
Basophils Absolute: 0.1 10*3/uL (ref 0.0–0.1)
Basophils Relative: 1 %
Eosinophils Absolute: 0.1 10*3/uL (ref 0.0–0.5)
Eosinophils Relative: 1 %
HCT: 44.1 % (ref 39.0–52.0)
Hemoglobin: 15.7 g/dL (ref 13.0–17.0)
Immature Granulocytes: 0 %
Lymphocytes Relative: 64 %
Lymphs Abs: 4.3 10*3/uL — ABNORMAL HIGH (ref 0.7–4.0)
MCH: 31.8 pg (ref 26.0–34.0)
MCHC: 35.6 g/dL (ref 30.0–36.0)
MCV: 89.3 fL (ref 80.0–100.0)
Monocytes Absolute: 0.5 10*3/uL (ref 0.1–1.0)
Monocytes Relative: 7 %
Neutro Abs: 1.8 10*3/uL (ref 1.7–7.7)
Neutrophils Relative %: 27 %
Platelets: 279 10*3/uL (ref 150–400)
RBC: 4.94 MIL/uL (ref 4.22–5.81)
RDW: 14.3 % (ref 11.5–15.5)
WBC: 6.7 10*3/uL (ref 4.0–10.5)
nRBC: 0 % (ref 0.0–0.2)

## 2020-09-24 LAB — BASIC METABOLIC PANEL
Anion gap: 11 (ref 5–15)
BUN: 12 mg/dL (ref 8–23)
CO2: 20 mmol/L — ABNORMAL LOW (ref 22–32)
Calcium: 8.6 mg/dL — ABNORMAL LOW (ref 8.9–10.3)
Chloride: 108 mmol/L (ref 98–111)
Creatinine, Ser: 0.98 mg/dL (ref 0.61–1.24)
GFR, Estimated: >60 mL/min (ref >60)
Glucose, Bld: 98 mg/dL (ref 70–99)
Potassium: 3.9 mmol/L (ref 3.5–5.1)
Sodium: 139 mmol/L (ref 135–145)

## 2020-09-24 LAB — LACTIC ACID, PLASMA: Lactic Acid, Venous: 2.1 mmol/L (ref 0.5–1.9)

## 2020-09-24 LAB — MAGNESIUM: Magnesium: 2 mg/dL (ref 1.7–2.4)

## 2020-09-24 LAB — TROPONIN I (HIGH SENSITIVITY)
Troponin I (High Sensitivity): 11 ng/L (ref <18)
Troponin I (High Sensitivity): 10 ng/L (ref <18)

## 2020-09-24 MED ORDER — APIXABAN 5 MG PO TABS: 5.0000 mg | ORAL_TABLET | Freq: Two times a day (BID) | ORAL | Status: DC

## 2020-09-24 MED ORDER — ACETAMINOPHEN 325 MG PO TABS: 650.0000 mg | ORAL_TABLET | ORAL | Status: DC | PRN

## 2020-09-24 MED ORDER — ONDANSETRON HCL 4 MG/2ML IJ SOLN: 4.0000 mg | Freq: Four times a day (QID) | INTRAMUSCULAR | Status: DC | PRN

## 2020-09-24 MED ORDER — APIXABAN 5 MG PO TABS
5.0000 mg | ORAL_TABLET | Freq: Two times a day (BID) | ORAL | Status: DC
  Administered 2020-09-24: 5 mg via ORAL
  Filled 2020-09-24: qty 1

## 2020-09-24 MED ORDER — DILTIAZEM HCL-DEXTROSE 125-5 MG/125ML-% IV SOLN (PREMIX)
5.0000 mg/h | INTRAVENOUS | Status: DC
  Administered 2020-09-24: 5 mg/h via INTRAVENOUS
  Filled 2020-09-24: qty 125

## 2020-09-24 MED ORDER — DILTIAZEM HCL ER COATED BEADS 240 MG PO CP24
240.0000 mg | ORAL_CAPSULE | Freq: Every day | ORAL | Status: DC
  Administered 2020-09-24: 240 mg via ORAL
  Filled 2020-09-24: qty 1

## 2020-09-24 MED ORDER — DILTIAZEM LOAD VIA INFUSION: 20.0000 mg | Freq: Once | INTRAVENOUS | Status: DC

## 2020-09-24 MED ORDER — ASPIRIN EC 81 MG PO TBEC
81.0000 mg | DELAYED_RELEASE_TABLET | Freq: Every day | ORAL | Status: DC
  Administered 2020-09-24: 81 mg via ORAL
  Filled 2020-09-24: qty 1

## 2020-09-24 MED ORDER — ZOLPIDEM TARTRATE 5 MG PO TABS: 5.0000 mg | ORAL_TABLET | Freq: Every evening | ORAL | Status: DC | PRN

## 2020-09-24 MED ORDER — DILTIAZEM HCL-DEXTROSE 125-5 MG/125ML-% IV SOLN (PREMIX)
5.0000 mg/h | INTRAVENOUS | Status: AC
  Administered 2020-09-24: 5 mg/h via INTRAVENOUS

## 2020-09-24 MED ORDER — DILTIAZEM HCL-DEXTROSE 125-5 MG/125ML-% IV SOLN (PREMIX): 5.0000 mg/h | INTRAVENOUS | Status: DC

## 2020-09-24 MED ORDER — DILTIAZEM HCL 25 MG/5ML IV SOLN
INTRAVENOUS | Status: AC
  Administered 2020-09-24: 20 mg
  Filled 2020-09-24: qty 5

## 2020-09-24 MED ORDER — SODIUM CHLORIDE 0.9 % IV BOLUS
1000.0000 mL | Freq: Once | INTRAVENOUS | Status: AC
Start: 2020-09-24 — End: 2020-09-24
  Administered 2020-09-24: 1000 mL via INTRAVENOUS

## 2020-09-24 MED ORDER — METOPROLOL SUCCINATE ER 50 MG PO TB24
100.0000 mg | ORAL_TABLET | Freq: Every day | ORAL | Status: DC
  Administered 2020-09-24: 100 mg via ORAL
  Filled 2020-09-24: qty 2

## 2020-09-24 MED ORDER — ROSUVASTATIN CALCIUM 20 MG PO TABS
20.0000 mg | ORAL_TABLET | Freq: Every day | ORAL | Status: DC
  Administered 2020-09-24: 20 mg via ORAL
  Filled 2020-09-24: qty 1

## 2020-09-24 MED ORDER — ALPRAZOLAM 0.5 MG PO TABS: 0.2500 mg | ORAL_TABLET | Freq: Two times a day (BID) | ORAL | Status: DC | PRN

## 2020-09-24 NOTE — ED Provider Notes (Signed)
[facility] Emergency Department Provider Note  ____________________________________________  Time seen: Approximately 12:32 AM  I have reviewed the triage vital signs and the nursing notes.   HISTORY  Chief Complaint Chest Pain (Pt presenting to ED with L sided sharp chest pain started about an hour ago. +SOB +eliquis for afib)    HPI Tyray Elley is a 65 y.o. male with a history of CAD hypertension paroxysmal atrial fibrillation who comes ED complaining of chest pain and shortness of breath that started about 1 hour ago.  He had just eaten dinner when the pain onset, sharp, central chest, radiating to both arms.  Associated shortness of breath.  No aggravating or alleviating factors.  He had not taken any of his medication today, and after the pain started he tried taking his metoprolol and diltiazem without relief.    He was recently admitted to the hospital 2 weeks ago for the same.  He was noted to also have a history of alcohol abuse and medication noncompliance.     Past Medical History:  Diagnosis Date  . Claudication (HCC)    a. 06/2015 ABI: R - 0.73, L - 0.73. 30-49% bilat SFA stenosis. 50-74% R Profunda stenosis; b. 01/2017 ABI: R 0.89, L 0.88, TBI R 0.65, L 1.0.  . Erectile dysfunction   . Essential hypertension   . History of echocardiogram    a. 03/29/2014: EF 55-60%, mild LVH, normal RVSP;  b. 05/2015 Echo: EF 60-65%, triv AI, mild MR; c. 07/2020 Echo:   Marland Kitchen Hyperlipidemia   . Non-obstructive CAD    a. 01/27/2014 Cath: LM nl, LAD mild diff dz w/o obs, LCx no sig obs - scattered 20-30% mLCx, RCA no obs dz, EF 70%; b. 06/2015 MV; EF 60%, no ischemia.  Marland Kitchen PAF (paroxysmal atrial fibrillation) (HCC)    a. Pt says Dx >22yrs ago w/ ? RFCA @ [facility];  b. Recurrent 12/2013;  b. Rx Flecainide and Xarelto-->Intermittent compliance.  Marland Kitchen PVC's (premature ventricular contractions)    a. 05/2017 Zio: 4000 PVCs in 48 hrs (2%). Brief run of SVT.  . Tobacco abuse    a.  ongoing - 1 ppd.     Patient Active Problem List   Diagnosis Date Noted  . Atrial fibrillation with RVR (HCC) 08/06/2020  . Demand ischemia (HCC)   . Polysubstance abuse (HCC)   . Rapid atrial fibrillation (HCC) 08/05/2020  . Alcohol use disorder, moderate, dependence (HCC) 08/05/2020  . Cocaine abuse (HCC) 08/05/2020  . Alcohol abuse 10/20/2019  . Depression 10/20/2019  . A-fib (HCC) 01/15/2017  . Personal history of tobacco use, presenting hazards to health 08/17/2016  . BPH associated with nocturia 07/30/2016  . IFG (impaired fasting glucose) 07/30/2016  . Chest pain 08/22/2015  . Essential hypertension   . PAD (peripheral artery disease) (HCC)   . 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 CAD in native artery 04/11/2014  . Paroxysmal atrial fibrillation (HCC)   . Tobacco abuse   . Hyperlipidemia   . Atrial fibrillation with rapid ventricular response (HCC) 01/27/2014     Past Surgical History:  Procedure Laterality Date  . CARDIAC CATHETERIZATION    . CARDIAC CATHETERIZATION     [facility]  . LEFT HEART CATH Right 01/27/2014   Procedure: LEFT HEART CATH;  Surgeon: Micheline Chapman, MD;  Location: [facility] CATH LAB;  Service: Cardiovascular;  Laterality: Right;     Prior to Admission medications   Medication Sig Start Date End Date Taking? Authorizing Provider  apixaban (  ELIQUIS) 5 MG TABS tablet Take 1 tablet (5 mg total) by mouth 2 (two) times daily. 09/12/20   Darren Cove, MD  diltiazem (CARDIZEM CD) 240 MG 24 hr capsule Take 1 capsule (240 mg total) by mouth daily. 09/12/20   Darren Cove, MD  metoprolol succinate (TOPROL-XL) 100 MG 24 hr tablet Take 1 tablet (100 mg total) by mouth daily. Take with or immediately following a meal. 09/12/20   Darren Cove, MD  rosuvastatin (CRESTOR) 20 MG tablet Take 1 tablet (20 mg total) by mouth daily for 14 days. 09/12/20 09/26/20  Darren Cove, MD     Allergies Patient has no known allergies.   Family History   Problem Relation Age of Onset  . Coronary artery disease Father 55  . Diabetes Mother   . Diabetes Sister   . Diabetes Brother   . Diabetes Maternal Grandmother   . Diabetes Sister   . Diabetes Sister     Social History Social History   Tobacco Use  . Smoking status: Current Every Day Smoker    Packs/day: 0.50    Years: 30.00    Pack years: 15.00    Types: Cigarettes  . Smokeless tobacco: Never Used  Substance Use Topics  . Alcohol use: Yes    Alcohol/week: 12.0 standard drinks    Types: 12 Cans of beer per week  . Drug use: Yes    Types: Cocaine    Review of Systems  Constitutional:   No fever or chills.  ENT:   No sore throat. No rhinorrhea. Cardiovascular: Positive chest pain as above without syncope. Respiratory: Positive shortness of breath without cough. Gastrointestinal:   Negative for abdominal pain, vomiting and diarrhea.  Musculoskeletal:   Negative for focal pain or swelling All other systems reviewed and are negative except as documented above in ROS and HPI.  ____________________________________________   PHYSICAL EXAM:  VITAL SIGNS: ED Triage Vitals  Enc Vitals Group     BP 09/24/20 0022 102/86     Pulse Rate 09/24/20 0022 (!) 101     Resp 09/24/20 0022 (!) 25     Temp 09/24/20 0022 97.7 F (36.5 C)     Temp Source 09/24/20 0022 Oral     SpO2 09/24/20 0022 100 %     Weight 09/24/20 0032 160 lb (72.6 kg)     Height 09/24/20 0032 5\' 6"  (1.676 m)     Head Circumference --      Peak Flow --      Pain Score --      Pain Loc --      Pain Edu? --      Excl. in GC? --     Vital signs reviewed, nursing assessments reviewed.   Constitutional:   Alert and oriented. Non-toxic appearance. Eyes:   Conjunctivae are normal. EOMI. PERRL. ENT      Head:   Normocephalic and atraumatic.      Nose:   Wearing a mask.      Mouth/Throat:   Wearing a mask.      Neck:   No meningismus. Full ROM. Hematological/Lymphatic/Immunilogical:   No cervical  lymphadenopathy. Cardiovascular:   Irregularly irregular rhythm, heart rate 130-170. Symmetric bilateral radial and DP pulses.  No murmurs. Cap refill less than 2 seconds. Respiratory:   Normal respiratory effort without tachypnea/retractions. Breath sounds are clear and equal bilaterally. No wheezes/rales/rhonchi. Gastrointestinal:   Soft and nontender. Non distended. There is no CVA tenderness.  No rebound, rigidity, or guarding. Genitourinary:  deferred Musculoskeletal:   Normal range of motion in all extremities. No joint effusions.  No lower extremity tenderness.  No edema. Neurologic:   Normal speech and language.  Motor grossly intact. No acute focal neurologic deficits are appreciated.  Skin:    Skin is warm, dry and intact. No rash noted.  No petechiae, purpura, or bullae.  ____________________________________________    LABS (pertinent positives/negatives) (all labs ordered are listed, but only abnormal results are displayed) Labs Reviewed  CBC WITH DIFFERENTIAL/PLATELET - Abnormal; Notable for the following components:      Result Value   Lymphs Abs 4.3 (*)    All other components within normal limits  LACTIC ACID, PLASMA - Abnormal; Notable for the following components:   Lactic Acid, Venous 2.1 (*)    All other components within normal limits  COMPREHENSIVE METABOLIC PANEL  MAGNESIUM  TROPONIN I (HIGH SENSITIVITY)  TROPONIN I (HIGH SENSITIVITY)   ____________________________________________   EKG  Interpreted by me Atrial fibrillation with rapid ventricular response, rate of 154.  Normal axis and intervals.  Normal QRS and ST segments.  Mild lateral T wave inversions  ____________________________________________    RADIOLOGY  DG Chest Portable 1 View  Result Date: 09/24/2020 CLINICAL DATA:  Chest pain EXAM: PORTABLE CHEST 1 VIEW COMPARISON:  09/10/2020 FINDINGS: Heart size and pulmonary vascularity are normal. Lungs are clear. No pleural effusions. No  pneumothorax. Old bilateral rib fractures. IMPRESSION: No active disease. Electronically Signed   By: Burman Nieves M.D.   On: 09/24/2020 00:40    ____________________________________________   PROCEDURES .Critical Care Performed by: Sharman Cheek, MD Authorized by: Sharman Cheek, MD   Critical care provider statement:    Critical care time (minutes):  35   Critical care time was exclusive of:  Separately billable procedures and treating other patients   Critical care was necessary to treat or prevent imminent or life-threatening deterioration of the following conditions:  Cardiac failure   Critical care was time spent personally by me on the following activities:  Development of treatment plan with patient or surrogate, discussions with consultants, evaluation of patient's response to treatment, examination of patient, obtaining history from patient or surrogate, ordering and performing treatments and interventions, ordering and review of laboratory studies, ordering and review of radiographic studies, pulse oximetry, re-evaluation of patient's condition and review of old charts    ____________________________________________  DIFFERENTIAL DIAGNOSIS   Uncontrolled A. fib due to medication noncompliance, holiday heart, dehydration, electrolyte abnormality, non-STEMI, pulmonary edema, pneumothorax  CLINICAL IMPRESSION / ASSESSMENT AND PLAN / ED COURSE  Medications ordered in the ED: Medications  diltiazem (CARDIZEM) 1 mg/mL load via infusion 20 mg ( Intravenous Canceled Entry 09/24/20 1400)    And  diltiazem (CARDIZEM) 125 mg in dextrose 5% 125 mL (1 mg/mL) infusion (5 mg/hr Intravenous New Bag/Given 09/24/20 0137)  diltiazem (CARDIZEM) 25 MG/5ML injection (20 mg  Given 09/24/20 0031)  sodium chloride 0.9 % bolus 1,000 mL (0 mLs Intravenous Stopped 09/24/20 0206)    Pertinent labs & imaging results that were available during my care of the patient were reviewed by me and  considered in my medical decision making (see chart for details).  Bracen Schum was evaluated in Emergency Department on 09/24/2020 for the symptoms described in the history of present illness. He was evaluated in the context of the global COVID-19 pandemic, which necessitated consideration that the patient might be at risk for infection with the SARS-CoV-2 virus that causes COVID-19. Institutional protocols and algorithms that pertain to  the evaluation of patients at risk for COVID-19 are in a state of rapid change based on information released by regulatory bodies including the CDC and federal and state organizations. These policies and algorithms were followed during the patient's care in the ED.   Patient presents with chest pain and shortness of breath, uncontrolled atrial fibrillation.  Most likely medication noncompliance possibly exacerbated by alcohol use.  Will give IV fluids, diltiazem bolus and drip.  Check labs and chest x-ray, likely need to admit again for stabilization.  Doubt PE or dissection.  EKG negative for STEMI.  Clinical Course as of 09/24/20 0259  Tue Sep 24, 2020  0245 Work-up unremarkable.  Rate controlled on Dilts infusion.  Blood pressure stable.  Will need to admit due to his comorbidities. [PS]    Clinical Course User Index [PS] Sharman Cheek, MD     ____________________________________________   FINAL CLINICAL IMPRESSION(S) / ED DIAGNOSES    Final diagnoses:  Atrial fibrillation with rapid ventricular response [facility])     ED Discharge Orders    None      Portions of this note were generated with dragon dictation software. Dictation errors may occur despite best attempts at proofreading.   Sharman Cheek, MD 09/24/20 [phone]

## 2020-09-24 NOTE — ED Notes (Signed)
Provided discharge instructions. Verbalized understanding.  

## 2020-09-24 NOTE — ED Notes (Signed)
Unable to pull cardizem from pyxis, awaiting pharmacy to send gtt

## 2020-09-24 NOTE — Care Management Obs Status (Signed)
MEDICARE OBSERVATION STATUS NOTIFICATION   Patient Details  Name: Darren Rogers MRN: [id number] Date of Birth: [birth year]   Medicare Observation Status Notification Given:  Yes    Marina Goodell 09/24/2020, 12:58 PM

## 2020-09-24 NOTE — Care Management CC44 (Signed)
Condition Code 44 Documentation Completed  Patient Details  Name: Darren Rogers MRN: [id number] Date of Birth: Aug 30, 1955   Condition Code 44 given:  Yes Patient signature on Condition Code 44 notice:  Yes Documentation of 2 MD's agreement:  Yes Code 44 added to claim:  Yes    Joseph Art, LCSWA 09/24/2020, 12:59 PM

## 2020-09-24 NOTE — Discharge Summary (Signed)
Physician Discharge Summary  Darren Rogers WUJ:[id number] DOB: 05/20/1956 DOA: 09/24/2020  PCP: Patient, No Pcp Per (Inactive)  Admit date: 09/24/2020 Discharge date: 09/24/2020  Admitted From: Home Disposition: Home   Recommendations for Outpatient Follow-up:  1. Follow up with PCP in 1-2 weeks 2. Please obtain BMP/CBC in one weeK  Home Health:NO Equipment/Devices: No  Discharge Condition:Stable CODE STATUS:FULL Diet recommendation: Heart Healthy   Brief/Interim Summary:  Paroxysmal atrial fibrillation with RVR  -Patient presents with A. fib with RVR, requiring Cardizem drip initially, cardiology input greatly appreciated, it was felt secondary that he missed his morning dose medication including metoprolol and diltiazem, he was transitioned from Cardizem drip to p.o. metoprolol succinate 100 mg oral daily and Cardizem CD 240 mg oral daily, heart rate currently controlled in the 70s, he remains in A. fib, he denies any dyspnea, chest pain, he will be discharged on his home regimen including metoprolol, diltiazem and metoprolol. -TSH within normal limit  Chest pain -Supply demand mismatch in the setting of RVR, at bedtime troponins negative x2, no further ischemia work-up per cardiology  Essential hypertension. -Continue home meds   Discharge Diagnoses:  Active Problems:   Atrial fibrillation with RVR [facility](HCC)    Discharge Instructions  Discharge Instructions    Amb referral to [facility]   Complete by: As directed    Diet - low sodium heart healthy   Complete by: As directed    Discharge instructions   Complete by: As directed    Follow with Primary MD  in 7 days   Get CBC, CMP checked  by Primary MD next visit.    Activity: As tolerated with Full fall precautions use walker/cane & assistance as needed   Disposition Home    Diet: Heart Healthy .  For Heart failure patients - Check your Weight same time everyday, if you gain over 2 pounds, or you develop in  leg swelling, experience more shortness of breath or chest pain, call your Primary MD immediately. Follow Cardiac Low Salt Diet and 1.5 lit/day fluid restriction.   On your next visit with your primary care physician please Get Medicines reviewed and adjusted.   Please request your Prim.MD to go over all Hospital Tests and Procedure/Radiological results at the follow up, please get all Hospital records sent to your Prim MD by signing hospital release before you go home.   If you experience worsening of your admission symptoms, develop shortness of breath, life threatening emergency, suicidal or homicidal thoughts you must seek medical attention immediately by calling 911 or calling your MD immediately  if symptoms less severe.  You Must read complete instructions/literature along with all the possible adverse reactions/side effects for all the Medicines you take and that have been prescribed to you. Take any new Medicines after you have completely understood and accpet all the possible adverse reactions/side effects.   Do not drive, operating heavy machinery, perform activities at heights, swimming or participation in water activities or provide baby sitting services if your were admitted for syncope or siezures until you have seen by Primary MD or a Neurologist and advised to do so again.  Do not drive when taking Pain medications.    Do not take more than prescribed Pain, Sleep and Anxiety Medications  Special Instructions: If you have smoked or chewed Tobacco  in the last 2 yrs please stop smoking, stop any regular Alcohol  and or any Recreational drug use.  Wear Seat belts while driving.   Please note  You were cared for by a hospitalist during your hospital stay. If you have any questions about your discharge medications or the care you received while you were in the hospital after you are discharged, you can call the unit and asked to speak with the hospitalist on call if the  hospitalist that took care of you is not available. Once you are discharged, your primary care physician will handle any further medical issues. Please note that NO REFILLS for any discharge medications will be authorized once you are discharged, as it is imperative that you return to your primary care physician (or establish a relationship with a primary care physician if you do not have one) for your aftercare needs so that they can reassess your need for medications and monitor your lab values.   Increase activity slowly   Complete by: As directed      Allergies as of 09/24/2020   No Known Allergies     Medication List    TAKE these medications   apixaban 5 MG Tabs tablet Commonly known as: ELIQUIS Take 1 tablet (5 mg total) by mouth 2 (two) times daily.   diltiazem 240 MG 24 hr capsule Commonly known as: CARDIZEM CD Take 1 capsule (240 mg total) by mouth daily.   metoprolol succinate 100 MG 24 hr tablet Commonly known as: TOPROL-XL Take 1 tablet (100 mg total) by mouth daily. Take with or immediately following a meal.   rosuvastatin 20 MG tablet Commonly known as: Crestor Take 1 tablet (20 mg total) by mouth daily for 14 days.       No Known Allergies  Consultations:  cardiollogy   Procedures/Studies: CT HEAD WO CONTRAST  Result Date: 09/10/2020 CLINICAL DATA:  Headaches EXAM: CT HEAD WITHOUT CONTRAST TECHNIQUE: Contiguous axial images were obtained from the base of the skull through the vertex without intravenous contrast. COMPARISON:  11/16/2014 FINDINGS: Brain: No evidence of acute infarction, hemorrhage, hydrocephalus, extra-axial collection or mass lesion/mass effect. Mild atrophic changes are noted. Vascular: No hyperdense vessel or unexpected calcification. Skull: Normal. Negative for fracture or focal lesion. Sinuses/Orbits: No acute finding. Other: None. IMPRESSION: Mild atrophic changes without acute abnormality Electronically Signed   By: Alcide Clever M.D.   On:  09/10/2020 08:31   DG Chest Portable 1 View  Result Date: 09/24/2020 CLINICAL DATA:  Chest pain EXAM: PORTABLE CHEST 1 VIEW COMPARISON:  09/10/2020 FINDINGS: Heart size and pulmonary vascularity are normal. Lungs are clear. No pleural effusions. No pneumothorax. Old bilateral rib fractures. IMPRESSION: No active disease. Electronically Signed   By: Burman Nieves M.D.   On: 09/24/2020 00:40   DG Chest Portable 1 View  Result Date: 09/10/2020 CLINICAL DATA:  Chest pain EXAM: PORTABLE CHEST 1 VIEW COMPARISON:  08/12/2020 FINDINGS: The lungs are symmetrically well expanded and are clear save for minimal linear scarring within the left mid lung zone. No pneumothorax or pleural effusion. Cardiac size within normal limits. Pulmonary vascularity is normal. Multiple bilateral healed rib fractures are identified. No acute bone abnormality. IMPRESSION: No active disease. Electronically Signed   By: Helyn Numbers MD   On: 09/10/2020 04:49      Subjective: He currently denies any chest pain, shortness of breath  Discharge Exam: Vitals:   09/24/20 0915 09/24/20 0930  BP:  (!) 132/97  Pulse: (!) 43 90  Resp: 15 15  Temp:    SpO2: 96% 95%   Vitals:   09/24/20 0500 09/24/20 0600 09/24/20 0915 09/24/20 0930  BP: 107/68 110/69  Marland Kitchen)  132/97  Pulse:  (!) 107 (!) 43 90  Resp: 16 18 15 15   Temp:      TempSrc:      SpO2:  96% 96% 95%  Weight:      Height:        General: Pt is alert, awake, not in acute distress Cardiovascular: irregular irregular, S1/S2 +, no rubs, no gallops Respiratory: CTA bilaterally, no wheezing, no rhonchi Abdominal: Soft, NT, ND, bowel sounds + Extremities: no edema, no cyanosis    The results of significant diagnostics from this hospitalization (including imaging, microbiology, ancillary and laboratory) are listed below for reference.     Microbiology: Recent Results (from the past 240 hour(s))  Resp Panel by RT-PCR (Flu A&B, Covid) Nasopharyngeal Swab      Status: None   Collection Time: 09/24/20  6:15 AM   Specimen: Nasopharyngeal Swab; Nasopharyngeal(NP) swabs in vial transport medium  Result Value Ref Range Status   SARS Coronavirus 2 by RT PCR NEGATIVE NEGATIVE Final    Comment: (NOTE) SARS-CoV-2 target nucleic acids are NOT DETECTED.  The SARS-CoV-2 RNA is generally detectable in upper respiratory specimens during the acute phase of infection. The lowest concentration of SARS-CoV-2 viral copies this assay can detect is 138 copies/mL. A negative result does not preclude SARS-Cov-2 infection and should not be used as the sole basis for treatment or other patient management decisions. A negative result may occur with  improper specimen collection/handling, submission of specimen other than nasopharyngeal swab, presence of viral mutation(s) within the areas targeted by this assay, and inadequate number of viral copies(<138 copies/mL). A negative result must be combined with clinical observations, patient history, and epidemiological information. The expected result is Negative.  Fact Sheet for Patients:  09/26/20  Fact Sheet for Healthcare Providers:  BloggerCourse.com  This test is no t yet approved or cleared by the [place] FDA and  has been authorized for detection and/or diagnosis of SARS-CoV-2 by FDA under an Emergency Use Authorization (EUA). This EUA will remain  in effect (meaning this test can be used) for the duration of the COVID-19 declaration under Section 564(b)(1) of the Act, 21 U.S.C.section 360bbb-3(b)(1), unless the authorization is terminated  or revoked sooner.       Influenza A by PCR NEGATIVE NEGATIVE Final   Influenza B by PCR NEGATIVE NEGATIVE Final    Comment: (NOTE) The Xpert Xpress SARS-CoV-2/FLU/RSV plus assay is intended as an aid in the diagnosis of influenza from Nasopharyngeal swab specimens and should not be used as a sole basis  for treatment. Nasal washings and aspirates are unacceptable for Xpert Xpress SARS-CoV-2/FLU/RSV testing.  Fact Sheet for Patients: [place]  Fact Sheet for Healthcare Providers: BloggerCourse.com  This test is not yet approved or cleared by the [place] FDA and has been authorized for detection and/or diagnosis of SARS-CoV-2 by FDA under an Emergency Use Authorization (EUA). This EUA will remain in effect (meaning this test can be used) for the duration of the COVID-19 declaration under Section 564(b)(1) of the Act, 21 U.S.C. section 360bbb-3(b)(1), unless the authorization is terminated or revoked.  Performed at [facility], [address] [place], [place]      Labs: BNP (last 3 results) Recent Labs    08/05/20 0420 08/12/20 0740  BNP 93.2 185.1*   Basic Metabolic Panel: Recent Labs  Lab 09/24/20 0030 09/24/20 0447  NA 137 139  K 4.0 3.9  CL 104 108  CO2 22 20*  GLUCOSE 77 98  BUN 12 12  CREATININE 1.19 0.98  CALCIUM 9.4 8.6*  MG 2.0  --    Liver Function Tests: Recent Labs  Lab 09/24/20 0030  AST 25  ALT 24  ALKPHOS 88  BILITOT 0.5  PROT 7.9  ALBUMIN 4.3   No results for input(s): LIPASE, AMYLASE in the last 168 hours. No results for input(s): AMMONIA in the last 168 hours. CBC: Recent Labs  Lab 09/24/20 0030 09/24/20 0447  WBC 6.7 4.8  NEUTROABS 1.8  --   HGB 15.7 13.3  HCT 44.1 38.0*  MCV 89.3 90.3  PLT 279 259   Cardiac Enzymes: No results for input(s): CKTOTAL, CKMB, CKMBINDEX, TROPONINI in the last 168 hours. BNP: Invalid input(s): POCBNP CBG: No results for input(s): GLUCAP in the last 168 hours. D-Dimer No results for input(s): DDIMER in the last 72 hours. Hgb A1c No results for input(s): HGBA1C in the last 72 hours. Lipid Profile No results for input(s): CHOL, HDL, LDLCALC, TRIG, CHOLHDL, LDLDIRECT in the last 72 hours. Thyroid function  studies No results for input(s): TSH, T4TOTAL, T3FREE, THYROIDAB in the last 72 hours.  Invalid input(s): FREET3 Anemia work up No results for input(s): VITAMINB12, FOLATE, FERRITIN, TIBC, IRON, RETICCTPCT in the last 72 hours. Urinalysis    Component Value Date/Time   COLORURINE YELLOW (A) 11/16/2014 1255   APPEARANCEUR Clear 07/30/2016 1600   LABSPEC 1.021 11/16/2014 1255   LABSPEC 1.019 03/28/2014 1430   PHURINE 5.0 11/16/2014 1255   GLUCOSEU Negative 07/30/2016 1600   GLUCOSEU Negative 03/28/2014 1430   HGBUR NEGATIVE 11/16/2014 1255   BILIRUBINUR Negative 07/30/2016 1600   BILIRUBINUR Negative 03/28/2014 1430   KETONESUR NEGATIVE 11/16/2014 1255   PROTEINUR Negative 07/30/2016 1600   PROTEINUR NEGATIVE 11/16/2014 1255   NITRITE Negative 07/30/2016 1600   NITRITE NEGATIVE 11/16/2014 1255   LEUKOCYTESUR Negative 07/30/2016 1600   LEUKOCYTESUR Negative 03/28/2014 1430   Sepsis Labs Invalid input(s): PROCALCITONIN,  WBC,  LACTICIDVEN Microbiology Recent Results (from the past 240 hour(s))  Resp Panel by RT-PCR (Flu A&B, Covid) Nasopharyngeal Swab     Status: None   Collection Time: 09/24/20  6:15 AM   Specimen: Nasopharyngeal Swab; Nasopharyngeal(NP) swabs in vial transport medium  Result Value Ref Range Status   SARS Coronavirus 2 by RT PCR NEGATIVE NEGATIVE Final    Comment: (NOTE) SARS-CoV-2 target nucleic acids are NOT DETECTED.  The SARS-CoV-2 RNA is generally detectable in upper respiratory specimens during the acute phase of infection. The lowest concentration of SARS-CoV-2 viral copies this assay can detect is 138 copies/mL. A negative result does not preclude SARS-Cov-2 infection and should not be used as the sole basis for treatment or other patient management decisions. A negative result may occur with  improper specimen collection/handling, submission of specimen other than nasopharyngeal swab, presence of viral mutation(s) within the areas targeted by this  assay, and inadequate number of viral copies(<138 copies/mL). A negative result must be combined with clinical observations, patient history, and epidemiological information. The expected result is Negative.  Fact Sheet for Patients:  BloggerCourse.com  Fact Sheet for Healthcare Providers:  [employer]  This test is no t yet approved or cleared by the [place] FDA and  has been authorized for detection and/or diagnosis of SARS-CoV-2 by FDA under an Emergency Use Authorization (EUA). This EUA will remain  in effect (meaning this test can be used) for the duration of the COVID-19 declaration under Section 564(b)(1) of the Act, 21 U.S.C.section 360bbb-3(b)(1), unless the authorization is  terminated  or revoked sooner.       Influenza A by PCR NEGATIVE NEGATIVE Final   Influenza B by PCR NEGATIVE NEGATIVE Final    Comment: (NOTE) The Xpert Xpress SARS-CoV-2/FLU/RSV plus assay is intended as an aid in the diagnosis of influenza from Nasopharyngeal swab specimens and should not be used as a sole basis for treatment. Nasal washings and aspirates are unacceptable for Xpert Xpress SARS-CoV-2/FLU/RSV testing.  Fact Sheet for Patients: BloggerCourse.com  Fact Sheet for Healthcare Providers: [employer]  This test is not yet approved or cleared by the [place] FDA and has been authorized for detection and/or diagnosis of SARS-CoV-2 by FDA under an Emergency Use Authorization (EUA). This EUA will remain in effect (meaning this test can be used) for the duration of the COVID-19 declaration under Section 564(b)(1) of the Act, 21 U.S.C. section 360bbb-3(b)(1), unless the authorization is terminated or revoked.  Performed at [facility], [address], [place], [place] [address]      Time coordinating discharge: 30 minutes  SIGNED:   Huey Bienenstock,  MD  [facility] 09/24/2020, 1:34 PM Pager   If 7PM-7AM, please contact night-coverage [email or web address] Password [email or web address]

## 2020-09-24 NOTE — Discharge Instructions (Signed)
Follow with Primary MD  in 7 days   Get CBC, CMP checked  by Primary MD next visit.    Activity: As tolerated with Full fall precautions use walker/cane & assistance as needed   Disposition Home    Diet: Heart Healthy .  For Heart failure patients - Check your Weight same time everyday, if you gain over 2 pounds, or you develop in leg swelling, experience more shortness of breath or chest pain, call your Primary MD immediately. Follow Cardiac Low Salt Diet and 1.5 lit/day fluid restriction.   On your next visit with your primary care physician please Get Medicines reviewed and adjusted.   Please request your Prim.MD to go over all Hospital Tests and Procedure/Radiological results at the follow up, please get all Hospital records sent to your Prim MD by signing hospital release before you go home.   If you experience worsening of your admission symptoms, develop shortness of breath, life threatening emergency, suicidal or homicidal thoughts you must seek medical attention immediately by calling 911 or calling your MD immediately  if symptoms less severe.  You Must read complete instructions/literature along with all the possible adverse reactions/side effects for all the Medicines you take and that have been prescribed to you. Take any new Medicines after you have completely understood and accpet all the possible adverse reactions/side effects.   Do not drive, operating heavy machinery, perform activities at heights, swimming or participation in water activities or provide baby sitting services if your were admitted for syncope or siezures until you have seen by Primary MD or a Neurologist and advised to do so again.  Do not drive when taking Pain medications.    Do not take more than prescribed Pain, Sleep and Anxiety Medications  Special Instructions: If you have smoked or chewed Tobacco  in the last 2 yrs please stop smoking, stop any regular Alcohol  and or any Recreational drug  use.  Wear Seat belts while driving.   Please note  You were cared for by a hospitalist during your hospital stay. If you have any questions about your discharge medications or the care you received while you were in the hospital after you are discharged, you can call the unit and asked to speak with the hospitalist on call if the hospitalist that took care of you is not available. Once you are discharged, your primary care physician will handle any further medical issues. Please note that NO REFILLS for any discharge medications will be authorized once you are discharged, as it is imperative that you return to your primary care physician (or establish a relationship with a primary care physician if you do not have one) for your aftercare needs so that they can reassess your need for medications and monitor your lab values.

## 2020-09-24 NOTE — Consult Note (Signed)
Cardiology Consultation:   Patient ID: Darren Rogers MRN: [id number]; DOB: May 10, 1956  Admit date: 09/24/2020 Date of Consult: 09/24/2020  PCP:  Patient, No Pcp Per (Inactive)   [facility]  Cardiologist:  Lorine Bears, MD  Electrophysiologist:  None   Patient Profile:   Darren Rogers is a 65 y.o. male with a hx of nonobstructive coronary artery disease, persistent atrial fibrillation with history of medication noncompliance, hypertension, hyperlipidemia, erectile dysfunction, and polysubstance abuse, who is being seen today for the evaluation of atrial fibrillation with rapid ventricular response at the request of Dr. Arville Care.  History of Present Illness:   Darren Rogers has had multiple ED/hospital visits over the last 2 months for symptomatic atrial fibrillation (most recently 4/12-14), typically in the setting of medication noncompliance.  He was doing well following his last discharge almost 2 weeks ago without chest pain, shortness of breath, or palpitations.  He forgot to take his medications yesterday morning and had sudden onset of chest pain, shortness of breath, and lightheadedness between 9:30 and 10 PM yesterday.  He describes his chest pain as both sharp and pressure-like radiating to both arms.  It is consistent with what he is felt in the past with atrial fibrillation with rapid ventricular response.  He tried taking his usual evening medications with no improvement, which prompted him to call 911.  He was found to be in atrial fibrillation with rapid ventricular response and transported to the ED for further management.  He has been placed on a diltiazem infusion with improvement in rate control (currently ventricular rates in the 80s) with resolution of all of his symptoms.   Past Medical History:  Diagnosis Date  . Claudication (HCC)    a. 06/2015 ABI: R - 0.73, L - 0.73. 30-49% bilat SFA stenosis. 50-74% R Profunda stenosis; b. 01/2017 ABI: R 0.89, L  0.88, TBI R 0.65, L 1.0.  . Erectile dysfunction   . Essential hypertension   . History of echocardiogram    a. 03/29/2014: EF 55-60%, mild LVH, normal RVSP;  b. 05/2015 Echo: EF 60-65%, triv AI, mild MR; c. 07/2020 Echo:   Marland Kitchen Hyperlipidemia   . Non-obstructive CAD    a. 01/27/2014 Cath: LM nl, LAD mild diff dz w/o obs, LCx no sig obs - scattered 20-30% mLCx, RCA no obs dz, EF 70%; b. 06/2015 MV; EF 60%, no ischemia.  Marland Kitchen PAF (paroxysmal atrial fibrillation) (HCC)    a. Pt says Dx >48yrs ago w/ ? RFCA @ [facility];  b. Recurrent 12/2013;  b. Rx Flecainide and Xarelto-->Intermittent compliance.  Marland Kitchen PVC's (premature ventricular contractions)    a. 05/2017 Zio: 4000 PVCs in 48 hrs (2%). Brief run of SVT.  . Tobacco abuse    a. ongoing - 1 ppd.    Past Surgical History:  Procedure Laterality Date  . CARDIAC CATHETERIZATION    . CARD[facility] CATHETERIZATION     [facility]  . LEFT HEART CATH Right 01/27/2014   Procedure: LEFT HEART CATH;  Surgeon: Micheline Chapman, MD;  Location: [facility] CATH LAB;  Service: Cardiovascular;  Laterality: Right;     Home Medications:  Prior to Admission medications   Medication Sig Start Date Lizett Chowning Date Taking? Authorizing Provider  apixaban (ELIQUIS) 5 MG TABS tablet Take 1 tablet (5 mg total) by mouth 2 (two) times daily. 09/12/20  Yes Zannie Cove, MD  diltiazem (CARDIZEM CD) 240 MG 24 hr capsule Take 1 capsule (240 mg total) by mouth daily. 09/12/20  Yes Jomarie Longs,  Joya Martyr, MD  metoprolol succinate (TOPROL-XL) 100 MG 24 hr tablet Take 1 tablet (100 mg total) by mouth daily. Take with or immediately following a meal. 09/12/20  Yes Zannie Cove, MD  rosuvastatin (CRESTOR) 20 MG tablet Take 1 tablet (20 mg total) by mouth daily for 14 days. 09/12/20 09/26/20 Yes Zannie Cove, MD    Inpatient Medications: Scheduled Meds: . apixaban  5 mg Oral BID  . aspirin EC  81 mg Oral Daily  . metoprolol succinate  100 mg Oral Daily  . rosuvastatin  20 mg Oral Daily   Continuous Infusions: .  diltiazem (CARDIZEM) infusion 5 mg/hr (09/24/20 0335)   PRN Meds: acetaminophen, ALPRAZolam, ondansetron (ZOFRAN) IV, zolpidem  Allergies:   No Known Allergies  Social History:   Social History   Tobacco Use  . Smoking status: Current Every Day Smoker    Packs/day: 0.50    Years: 30.00    Pack years: 15.00    Types: Cigarettes  . Smokeless tobacco: Never Used  Substance Use Topics  . Alcohol use: Yes    Alcohol/week: 12.0 standard drinks    Types: 12 Cans of beer per week  . Drug use: Yes    Types: Cocaine     Family History:    Family History  Problem Relation Age of Onset  . Coronary artery disease Father 12  . Diabetes Mother   . Diabetes Sister   . Diabetes Brother   . Diabetes Maternal Grandmother   . Diabetes Sister   . Diabetes Sister      ROS:  Please see the history of present illness. All other ROS reviewed and negative.     Physical Exam/Data:   Vitals:   09/24/20 0300 09/24/20 0400 09/24/20 0500 09/24/20 0600  BP: 104/73 103/67 107/68 110/69  Pulse: 96 98  (!) 107  Resp: 16 20 16 18   Temp:      TempSrc:      SpO2: 95% 94%  96%  Weight:      Height:       No intake or output data in the 24 hours ending 09/24/20 0924 Last 3 Weights 09/24/2020 09/12/2020 09/10/2020  Weight (lbs) 160 lb 159 lb 9.6 oz 155 lb  Weight (kg) 72.576 kg 72.394 kg 70.308 kg     Body mass index is 25.82 kg/m.  General:  Well nourished, well developed, in no acute distress HEENT: normal Lymph: no adenopathy Neck: no JVD Endocrine:  No thryomegaly Vascular: No carotid bruits; 2+ radial pulses bilaterally. Cardiac: Irregularly irregular rhythm without murmurs, rubs, or gallops. Lungs:  clear to auscultation bilaterally, no wheezing, rhonchi or rales  Abd: soft, nontender, no hepatomegaly  Ext: no edema Musculoskeletal:  No deformities, BUE and BLE strength normal and equal Skin: warm and dry  Neuro:  CNs 2-12 intact, no focal abnormalities noted Psych:  Normal affect    EKG:  The EKG was personally reviewed and demonstrates: Atrial fibrillation with rapid ventricular response, poor R wave progression, and lateral ST/T changes.  Heart rate has increased since 09/12/2020.  Otherwise, no significant interval change.  Telemetry:  Telemetry was personally reviewed and demonstrates: Atrial fibrillation with presenting rates up to 160 bpm.  Heart rates have trended down and are now in the 80 to 100 bpm range.  Relevant CV Studies: TTE (08/06/2020): 1. Left ventricular ejection fraction, by estimation, is 50 to 55%. The  left ventricle has low normal function. The left ventricle has no regional  wall motion abnormalities. There  is mild left ventricular hypertrophy.  Left ventricular diastolic  parameters are indeterminate. The average left ventricular global  longitudinal strain is -8.7 %. The global longitudinal strain is abnormal.  2. Right ventricular systolic function is mildly reduced. The right  ventricular size is normal. Moderately increased right ventricular wall  thickness.  3. Left atrial size was mildly dilated.  4. The mitral valve is normal in structure. Mild mitral valve  regurgitation. No evidence of mitral stenosis.  5. The aortic valve is tricuspid. There is mild thickening of the aortic  valve. Aortic valve regurgitation is mild. Mild aortic valve sclerosis is  present, with no evidence of aortic valve stenosis.  6. The inferior vena cava is normal in size with greater than 50%  respiratory variability, suggesting right atrial pressure of 3 mmHg.   Laboratory Data:  High Sensitivity Troponin:   Recent Labs  Lab 09/10/20 0411 09/10/20 0607 09/11/20 0607 09/24/20 0030 09/24/20 0333  TROPONINIHS 88* 91* 64* 11 10     Chemistry Recent Labs  Lab 09/24/20 0030 09/24/20 0447  NA 137 139  K 4.0 3.9  CL 104 108  CO2 22 20*  GLUCOSE 77 98  BUN 12 12  CREATININE 1.19 0.98  CALCIUM 9.4 8.6*  GFRNONAA >60 >60  ANIONGAP 11 11     Recent Labs  Lab 09/24/20 0030  PROT 7.9  ALBUMIN 4.3  AST 25  ALT 24  ALKPHOS 88  BILITOT 0.5   Hematology Recent Labs  Lab 09/24/20 0030 09/24/20 0447  WBC 6.7 4.8  RBC 4.94 4.21*  HGB 15.7 13.3  HCT 44.1 38.0*  MCV 89.3 90.3  MCH 31.8 31.6  MCHC 35.6 35.0  RDW 14.3 14.4  PLT 279 259   BNPNo results for input(s): BNP, PROBNP in the last 168 hours.  DDimer No results for input(s): DDIMER in the last 168 hours.   Radiology/Studies:  DG Chest Portable 1 View  Result Date: 09/24/2020 CLINICAL DATA:  Chest pain EXAM: PORTABLE CHEST 1 VIEW COMPARISON:  09/10/2020 FINDINGS: Heart size and pulmonary vascularity are normal. Lungs are clear. No pleural effusions. No pneumothorax. Old bilateral rib fractures. IMPRESSION: No active disease. Electronically Signed   By: Burman Nieves M.D.   On: 09/24/2020 00:40     Assessment and Plan:   Persistent atrial fibrillation: I suspect Mr. Dehoyos has remained in atrial fibrillation with symptoms correlating to poor ventricular rate control.  Yesterday's episode was most likely precipitated by having missed his morning medications including metoprolol and diltiazem.  Continue metoprolol succinate 100 mg daily  Transition from IV diltiazem to home dose of diltiazem 240 mg daily.  Continue apixaban 5 mg twice daily.  If patient is able to remain compliant with anticoagulation for a month, would recommend pursuing elective cardioversion.  If ventricular rates remain adequately controlled after transitioning to oral diltiazem, I think it would be reasonable for Mr. Walen to be discharged later today and follow-up as already scheduled in our office next week (09/30/2020).  Chest pain: Suspect this is likely due to supply-demand mismatch in the setting of rapid ventricular rates with a-fib.  High-sensitivity troponin I is negative x2 during this admission.  No further ischemia testing is recommended at this time   Risk  Assessment/Risk Scores:       CHA2DS2-VASc Score = 3  This indicates a 3.2% annual risk of stroke. The patient's score is based upon: CHF History: No HTN History: Yes Diabetes History: No Stroke History: No Vascular Disease  History: Yes Age Score: 1 Gender Score: 0    For questions or updates, please contact [facility] Please consult [email or web address] for contact info under [facility].  Signed, Yvonne Kendall, MD  09/24/2020 9:24 AM

## 2020-09-24 NOTE — ED Notes (Signed)
Pt resting comfortably on stretcher in NAD, denies needs. Cardizem @ 5mg /hr. Pt still in afib but at controlled rate

## 2020-09-24 NOTE — ED Notes (Addendum)
Chief Complaint  Patient presents with  . Chest Pain    Pt presenting to ED with L sided sharp chest pain started about an hour ago. +SOB +eliquis for afib   Pt presenting to ED with the above complaint. Reports he was eating then suddenly started having CP. afib RVR on arrival to room. States he missed his dose of medication yesterday morning. AAOx4

## 2020-09-24 NOTE — H&P (Addendum)
Darren Rogers   PATIENT NAME: Darren Rogers    MR#:  [id number]  DATE OF BIRTH:  [birth year]  DATE OF ADMISSION:  09/24/2020  PRIMARY CARE PHYSICIAN: Patient, No Pcp Per (Inactive)   Patient is coming from: Home  REQUESTING/REFERRING PHYSICIAN: Alfonse Flavors, MD  CHIEF COMPLAINT:   Chief Complaint  Patient presents with  . Chest Pain    Pt presenting to ED with L sided sharp chest pain started about an hour ago. +SOB +eliquis for afib    HISTORY OF PRESENT ILLNESS:  Linc Darren Rogers is a 65 y.o. African-American male with medical history significant for coronary artery disease, paroxysmal atrial fibrillation on Eliquis, tobacco abuse, epistaxis, essential hypertension and tobacco abuse, who presented to the ER with acute onset of sharp midsternal chest pain earlier today with radiation to his left shoulder, generalized weakness and dizziness with a headache.  He denied any dyspnea or cough or wheezing.  No nausea or vomiting or abdominal pain.  No dysuria, oliguria or hematuria or flank pain.  No bleeding diathesis.  He had similar experience when he was in atrial fibrillation with rapid ventricular response.  He denies any palpitations.  ED Course: Vital signs revealed a heart rate of 101 and later on was 151-154 respiratory to 25. EKG as reviewed by me : Showed atrial fibrillation with ventricular response of 154 with Q waves anteroseptally and T wave inversion laterally and inferiorly.  Lactic acid was 2.1 and CBC was within normal limits as well as CMP. Imaging: Chest x-ray showed old bilateral rib fractures with otherwise no acute abnormalities. . The patient was given 20 mg of IV Cardizem bolus followed by drip and 1 L bolus of IV normal saline.  His rate was down to 93.  He will be admitted to a progressive unit bed for further evaluation and management.  PAST MEDICAL HISTORY:   Past Medical History:  Diagnosis Date  . Claudication (HCC)    a. 06/2015 ABI: R - 0.73, L -  0.73. 30-49% bilat SFA stenosis. 50-74% R Profunda stenosis; b. 01/2017 ABI: R 0.89, L 0.88, TBI R 0.65, L 1.0.  . Erectile dysfunction   . Essential hypertension   . History of echocardiogram    a. 03/29/2014: EF 55-60%, mild LVH, normal RVSP;  b. 05/2015 Echo: EF 60-65%, triv AI, mild MR; c. 07/2020 Echo:   Marland Kitchen Hyperlipidemia   . Non-obstructive CAD    a. 01/27/2014 Cath: LM nl, LAD mild diff dz w/o obs, LCx no sig obs - scattered 20-30% mLCx, RCA no obs dz, EF 70%; b. 06/2015 MV; EF 60%, no ischemia.  Marland Kitchen PAF (paroxysmal atrial fibrillation) (HCC)    a. Pt says Dx >57yrs ago w/ ? RFCA @ [facility];  b. Recurrent 12/2013;  b. Rx Flecainide and Xarelto-->Intermittent compliance.  Marland Kitchen PVC's (premature ventricular contractions)    a. 05/2017 Zio: 4000 PVCs in 48 hrs (2%). Brief run of SVT.  . Tobacco abuse    a. ongoing - 1 ppd.    PAST SURGICAL HISTORY:   Past Surgical History:  Procedure Laterality Date  . CARDIAC CATHETERIZATION    . CARD[facility] CATHETERIZATION     [facility]  . LEFT HEART CATH Right 01/27/2014   Procedure: LEFT HEART CATH;  Surgeon: Micheline Chapman, MD;  Location: [facility] CATH LAB;  Service: Cardiovascular;  Laterality: Right;    SOCIAL HISTORY:   Social History   Tobacco Use  . Smoking status: Current Every Day Smoker  Packs/day: 0.50    Years: 30.00    Pack years: 15.00    Types: Cigarettes  . Smokeless tobacco: Never Used  Substance Use Topics  . Alcohol use: Yes    Alcohol/week: 12.0 standard drinks    Types: 12 Cans of beer per week    FAMILY HISTORY:   Family History  Problem Relation Age of Onset  . Coronary artery disease Father 38  . Diabetes Mother   . Diabetes Sister   . Diabetes Brother   . Diabetes Maternal Grandmother   . Diabetes Sister   . Diabetes Sister     DRUG ALLERGIES:  No Known Allergies  REVIEW OF SYSTEMS:   ROS As per history of present illness. All pertinent systems were reviewed above. Constitutional, HEENT, cardiovascular, respiratory,  GI, GU, musculoskeletal, neuro, psychiatric, endocrine, integumentary and hematologic systems were reviewed and are otherwise negative/unremarkable except for positive findings mentioned above in the HPI.   MEDICATIONS AT HOME:   Prior to Admission medications   Medication Sig Start Date End Date Taking? Authorizing Provider  apixaban (ELIQUIS) 5 MG TABS tablet Take 1 tablet (5 mg total) by mouth 2 (two) times daily. 09/12/20   Zannie Cove, MD  diltiazem (CARDIZEM CD) 240 MG 24 hr capsule Take 1 capsule (240 mg total) by mouth daily. 09/12/20   Zannie Cove, MD  metoprolol succinate (TOPROL-XL) 100 MG 24 hr tablet Take 1 tablet (100 mg total) by mouth daily. Take with or immediately following a meal. 09/12/20   Zannie Cove, MD  rosuvastatin (CRESTOR) 20 MG tablet Take 1 tablet (20 mg total) by mouth daily for 14 days. 09/12/20 09/26/20  Zannie Cove, MD      VITAL SIGNS:  Blood pressure (!) 128/96, pulse (!) 104, temperature 97.7 F (36.5 C), temperature source Oral, resp. rate 20, height 5\' 6"  (1.676 m), weight 72.6 kg, SpO2 95 %.  PHYSICAL EXAMINATION:  Physical Exam  GENERAL:  65 y.o.-year-old African-American patient lying in the bed with no acute distress.  EYES: Pupils equal, round, reactive to light and accommodation. No scleral icterus. Extraocular muscles intact.  HEENT: Head atraumatic, normocephalic. Oropharynx and nasopharynx clear.  NECK:  Supple, no jugular venous distention. No thyroid enlargement, no tenderness.  LUNGS: Normal breath sounds bilaterally, no wheezing, rales,rhonchi or crepitation. No use of accessory muscles of respiration.  CARDIOVASCULAR: Irregularly irregular minimally tachycardic rhythm, S1, S2 normal. No murmurs, rubs, or gallops.  ABDOMEN: Soft, nondistended, nontender. Bowel sounds present. No organomegaly or mass.  EXTREMITIES: No pedal edema, cyanosis, or clubbing.  NEUROLOGIC: Cranial nerves II through XII are intact. Muscle strength 5/5  in all extremities. Sensation intact. Gait not checked.  PSYCHIATRIC: The patient is alert and oriented x 3.  Normal affect and good eye contact. SKIN: No obvious rash, lesion, or ulcer.   LABORATORY PANEL:   CBC Recent Labs  Lab 09/24/20 0030  WBC 6.7  HGB 15.7  HCT 44.1  PLT 279   ------------------------------------------------------------------------------------------------------------------  Chemistries  Recent Labs  Lab 09/24/20 0030  NA 137  K 4.0  CL 104  CO2 22  GLUCOSE 77  BUN 12  CREATININE 1.19  CALCIUM 9.4  MG 2.0  AST 25  ALT 24  ALKPHOS 88  BILITOT 0.5   ------------------------------------------------------------------------------------------------------------------  Cardiac Enzymes No results for input(s): TROPONINI in the last 168 hours. ------------------------------------------------------------------------------------------------------------------  RADIOLOGY:  DG Chest Portable 1 View  Result Date: 09/24/2020 CLINICAL DATA:  Chest pain EXAM: PORTABLE CHEST 1 VIEW COMPARISON:  09/10/2020 FINDINGS:  Heart size and pulmonary vascularity are normal. Lungs are clear. No pleural effusions. No pneumothorax. Old bilateral rib fractures. IMPRESSION: No active disease. Electronically Signed   By: Burman Nieves M.D.   On: 09/24/2020 00:40      IMPRESSION AND PLAN:  Active Problems:   Atrial fibrillation with RVR (HCC)  1.  Paroxysmal atrial fibrillation with rapid ventricular response. - The patient will be admitted to a progressive unit bed. - We will continue him on IV Cardizem drip. - We will continue the patient on his Eliquis. -His CHA2DS2-VASc score is 2 - We will obtain  cardiology consultation in cardiology consultation. - I notified Dr. Johney Frame about the patient. - The patient had a recent 2D echo on 08/05/2020 revealing an EF of 50 to 55% and left atrial dilatation, mild mitral regurgitation and mild aortic regurgitation.  2.  Essential  hypertension. - Continue Toprol-XL hold off Cardizem CD while the patient is on IV Cardizem drip.  3.  Dyslipidemia. - We will continue statin therapy.  4.  Coronary artery disease. - We will continue beta-blocker therapy and aspirin.  DVT prophylaxis: We will continue Eliquis. Code Status: full code. Family Communication:  The plan of care was discussed in details with the patient (and family). I answered all questions. The patient agreed to proceed with the above mentioned plan. Further management will depend upon hospital course. Disposition Plan: Back to previous home environment Consults called: Cardiology consult to [facility]. All the records are reviewed and case discussed with ED provider.  Status is: Inpatient  Remains inpatient appropriate because:Ongoing diagnostic testing needed not appropriate for outpatient work up, Unsafe d/c plan, IV treatments appropriate due to intensity of illness or inability to take PO and Inpatient level of care appropriate due to severity of illness   Dispo: The patient is from: Home              Anticipated d/c is to: Home              Patient currently is not medically stable to d/c.   Difficult to place patient No   TOTAL TIME TAKING CARE OF THIS PATIENT: 55 minutes.    Hannah Beat M.D on 09/24/2020 at 3:08 AM  [facility]   From 7 PM-7 AM, contact night-coverage [email or web address]  CC: Primary care physician; Patient, No Pcp Per (Inactive)

## 2020-09-30 ENCOUNTER — Ambulatory Visit: Payer: Medicare Other | Admitting: Physician Assistant
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
## 2020-10-11 ENCOUNTER — Ambulatory Visit: Payer: Medicare Other | Admitting: Physician Assistant

## 2020-10-12 ENCOUNTER — Other Ambulatory Visit: Payer: Self-pay

## 2020-10-12 ENCOUNTER — Emergency Department: Payer: Medicare Other

## 2020-10-12 ENCOUNTER — Observation Stay
Admission: EM | Admit: 2020-10-12 | Discharge: 2020-10-13 | Disposition: A | Discharge status: Home / Self Care | Payer: Medicare Other | Attending: Family Medicine | Admitting: Family Medicine

## 2020-10-12 DIAGNOSIS — I2510 Atherosclerotic heart disease of native coronary artery without angina pectoris: Secondary | ICD-10-CM | POA: Insufficient documentation

## 2020-10-12 DIAGNOSIS — Z79899 Other long term (current) drug therapy: Secondary | ICD-10-CM | POA: Diagnosis not present

## 2020-10-12 DIAGNOSIS — F1020 Alcohol dependence, uncomplicated: Secondary | ICD-10-CM | POA: Diagnosis present

## 2020-10-12 DIAGNOSIS — Y903 Blood alcohol level of 60-79 mg/100 ml: Secondary | ICD-10-CM | POA: Insufficient documentation

## 2020-10-12 DIAGNOSIS — I10 Essential (primary) hypertension: Secondary | ICD-10-CM | POA: Insufficient documentation

## 2020-10-12 DIAGNOSIS — F17210 Nicotine dependence, cigarettes, uncomplicated: Secondary | ICD-10-CM | POA: Insufficient documentation

## 2020-10-12 DIAGNOSIS — R079 Chest pain, unspecified: Secondary | ICD-10-CM | POA: Diagnosis present

## 2020-10-12 DIAGNOSIS — R0789 Other chest pain: Secondary | ICD-10-CM | POA: Diagnosis present

## 2020-10-12 DIAGNOSIS — Z7901 Long term (current) use of anticoagulants: Secondary | ICD-10-CM | POA: Diagnosis not present

## 2020-10-12 DIAGNOSIS — I4891 Unspecified atrial fibrillation: Secondary | ICD-10-CM | POA: Diagnosis not present

## 2020-10-12 DIAGNOSIS — Z20822 Contact with and (suspected) exposure to covid-19: Secondary | ICD-10-CM | POA: Insufficient documentation

## 2020-10-12 LAB — URINE DRUG SCREEN, QUALITATIVE (ARMC ONLY)
Amphetamines, Ur Screen: NOT DETECTED
Barbiturates, Ur Screen: NOT DETECTED
Benzodiazepine, Ur Scrn: NOT DETECTED
Cannabinoid 50 Ng, Ur ~~LOC~~: NOT DETECTED
Cocaine Metabolite,Ur ~~LOC~~: NOT DETECTED
MDMA (Ecstasy)Ur Screen: NOT DETECTED
Methadone Scn, Ur: NOT DETECTED
Opiate, Ur Screen: NOT DETECTED
Phencyclidine (PCP) Ur S: NOT DETECTED
Tricyclic, Ur Screen: NOT DETECTED

## 2020-10-12 LAB — BASIC METABOLIC PANEL
Anion gap: 10 (ref 5–15)
BUN: 13 mg/dL (ref 8–23)
CO2: 19 mmol/L — ABNORMAL LOW (ref 22–32)
Calcium: 9.1 mg/dL (ref 8.9–10.3)
Chloride: 107 mmol/L (ref 98–111)
Creatinine, Ser: 0.97 mg/dL (ref 0.61–1.24)
GFR, Estimated: >60 mL/min (ref >60)
Glucose, Bld: 78 mg/dL (ref 70–99)
Potassium: 3.5 mmol/L (ref 3.5–5.1)
Sodium: 136 mmol/L (ref 135–145)

## 2020-10-12 LAB — CBC WITH DIFFERENTIAL/PLATELET
Abs Immature Granulocytes: 0.02 K/uL (ref 0.00–0.07)
Basophils Absolute: 0 K/uL (ref 0.0–0.1)
Basophils Relative: 1 %
Eosinophils Absolute: 0.1 K/uL (ref 0.0–0.5)
Eosinophils Relative: 2 %
HCT: 40.1 % (ref 39.0–52.0)
Hemoglobin: 14 g/dL (ref 13.0–17.0)
Immature Granulocytes: 0 %
Lymphocytes Relative: 56 %
Lymphs Abs: 3.7 K/uL (ref 0.7–4.0)
MCH: 31.9 pg (ref 26.0–34.0)
MCHC: 34.9 g/dL (ref 30.0–36.0)
MCV: 91.3 fL (ref 80.0–100.0)
Monocytes Absolute: 0.5 K/uL (ref 0.1–1.0)
Monocytes Relative: 8 %
Neutro Abs: 2.1 K/uL (ref 1.7–7.7)
Neutrophils Relative %: 33 %
Platelets: 227 K/uL (ref 150–400)
RBC: 4.39 MIL/uL (ref 4.22–5.81)
RDW: 14 % (ref 11.5–15.5)
WBC: 6.4 K/uL (ref 4.0–10.5)
nRBC: 0 % (ref 0.0–0.2)

## 2020-10-12 LAB — RESP PANEL BY RT-PCR (FLU A&B, COVID) ARPGX2
Influenza A by PCR: NEGATIVE
Influenza B by PCR: NEGATIVE
SARS Coronavirus 2 by RT PCR: NEGATIVE

## 2020-10-12 LAB — TROPONIN I (HIGH SENSITIVITY)
Troponin I (High Sensitivity): 5 ng/L (ref <18)
Troponin I (High Sensitivity): 6 ng/L (ref <18)

## 2020-10-12 LAB — MAGNESIUM: Magnesium: 2 mg/dL (ref 1.7–2.4)

## 2020-10-12 LAB — PHOSPHORUS: Phosphorus: 4 mg/dL (ref 2.5–4.6)

## 2020-10-12 LAB — ETHANOL: Alcohol, Ethyl (B): 73 mg/dL — ABNORMAL HIGH (ref <10)

## 2020-10-12 MED ORDER — LACTATED RINGERS IV BOLUS
500.0000 mL | Freq: Once | INTRAVENOUS | Status: AC
  Administered 2020-10-12: 500 mL via INTRAVENOUS

## 2020-10-12 MED ORDER — DILTIAZEM HCL 25 MG/5ML IV SOLN
5.0000 mg | Freq: Once | INTRAVENOUS | Status: AC
  Administered 2020-10-12: 5 mg via INTRAVENOUS
  Filled 2020-10-12: qty 5

## 2020-10-12 MED ORDER — ACETAMINOPHEN 325 MG PO TABS: 650.0000 mg | ORAL_TABLET | ORAL | Status: DC | PRN

## 2020-10-12 MED ORDER — ADULT MULTIVITAMIN W/MINERALS CH
1.0000 | ORAL_TABLET | Freq: Every day | ORAL | Status: DC
  Administered 2020-10-12 – 2020-10-13 (×2): 1 via ORAL
  Filled 2020-10-12 (×2): qty 1

## 2020-10-12 MED ORDER — DILTIAZEM HCL-DEXTROSE 125-5 MG/125ML-% IV SOLN (PREMIX)
5.0000 mg/h | INTRAVENOUS | Status: DC
  Filled 2020-10-12: qty 125

## 2020-10-12 MED ORDER — FOLIC ACID 1 MG PO TABS
1.0000 mg | ORAL_TABLET | Freq: Every day | ORAL | Status: DC
  Administered 2020-10-12 – 2020-10-13 (×2): 1 mg via ORAL
  Filled 2020-10-12 (×2): qty 1

## 2020-10-12 MED ORDER — DILTIAZEM HCL-DEXTROSE 125-5 MG/125ML-% IV SOLN (PREMIX)
5.0000 mg/h | INTRAVENOUS | Status: DC
  Administered 2020-10-12: 5 mg/h via INTRAVENOUS
  Filled 2020-10-12: qty 125

## 2020-10-12 MED ORDER — LORAZEPAM 2 MG/ML IJ SOLN: 1.0000 mg | INTRAMUSCULAR | Status: DC | PRN

## 2020-10-12 MED ORDER — LORAZEPAM 2 MG/ML IJ SOLN
0.0000 mg | Freq: Four times a day (QID) | INTRAMUSCULAR | Status: DC
Start: 2020-10-12 — End: 2020-10-13

## 2020-10-12 MED ORDER — ORAL CARE MOUTH RINSE
15.0000 mL | Freq: Two times a day (BID) | OROMUCOSAL | Status: DC
  Administered 2020-10-12: 15 mL via OROMUCOSAL
  Filled 2020-10-12: qty 15

## 2020-10-12 MED ORDER — THIAMINE HCL 100 MG/ML IJ SOLN
100.0000 mg | Freq: Every day | INTRAMUSCULAR | Status: DC
  Filled 2020-10-12: qty 2

## 2020-10-12 MED ORDER — THIAMINE HCL 100 MG PO TABS
100.0000 mg | ORAL_TABLET | Freq: Every day | ORAL | Status: DC
  Administered 2020-10-12 – 2020-10-13 (×2): 100 mg via ORAL
  Filled 2020-10-12 (×2): qty 1

## 2020-10-12 MED ORDER — DILTIAZEM HCL ER COATED BEADS 240 MG PO CP24
240.0000 mg | ORAL_CAPSULE | Freq: Once | ORAL | Status: AC
  Administered 2020-10-12: 240 mg via ORAL
  Filled 2020-10-12: qty 1

## 2020-10-12 MED ORDER — DILTIAZEM HCL ER COATED BEADS 120 MG PO CP24
240.0000 mg | ORAL_CAPSULE | Freq: Every day | ORAL | Status: DC
  Administered 2020-10-12 – 2020-10-13 (×2): 240 mg via ORAL
  Filled 2020-10-12: qty 1
  Filled 2020-10-12: qty 2

## 2020-10-12 MED ORDER — LORAZEPAM 2 MG/ML IJ SOLN
0.0000 mg | Freq: Two times a day (BID) | INTRAMUSCULAR | Status: DC
Start: 2020-10-14 — End: 2020-10-13

## 2020-10-12 MED ORDER — ASPIRIN 81 MG PO CHEW
324.0000 mg | CHEWABLE_TABLET | Freq: Once | ORAL | Status: AC
  Administered 2020-10-12: 243 mg via ORAL
  Filled 2020-10-12: qty 4

## 2020-10-12 MED ORDER — METOPROLOL SUCCINATE ER 100 MG PO TB24
100.0000 mg | ORAL_TABLET | Freq: Every day | ORAL | Status: DC
  Administered 2020-10-12 – 2020-10-13 (×2): 100 mg via ORAL
  Filled 2020-10-12: qty 1
  Filled 2020-10-12: qty 2

## 2020-10-12 MED ORDER — MAGNESIUM SULFATE 2 GM/50ML IV SOLN
2.0000 g | Freq: Once | INTRAVENOUS | Status: AC
  Administered 2020-10-12: 2 g via INTRAVENOUS
  Filled 2020-10-12: qty 50

## 2020-10-12 MED ORDER — APIXABAN 5 MG PO TABS
5.0000 mg | ORAL_TABLET | Freq: Two times a day (BID) | ORAL | Status: DC
  Administered 2020-10-12 – 2020-10-13 (×3): 5 mg via ORAL
  Filled 2020-10-12 (×3): qty 1

## 2020-10-12 MED ORDER — ONDANSETRON HCL 4 MG/2ML IJ SOLN: 4.0000 mg | Freq: Four times a day (QID) | INTRAMUSCULAR | Status: DC | PRN

## 2020-10-12 MED ORDER — LORAZEPAM 1 MG PO TABS
1.0000 mg | ORAL_TABLET | ORAL | Status: DC | PRN
Start: 2020-10-12 — End: 2020-10-13

## 2020-10-12 NOTE — ED Notes (Signed)
Resting in room with eyes closed, no distress noted.

## 2020-10-12 NOTE — ED Notes (Signed)
Report Zach RN

## 2020-10-12 NOTE — ED Notes (Signed)
Pt was given meal tray and ate 100% of meal

## 2020-10-12 NOTE — ED Notes (Signed)
Report received, awaiting admission, patient resting in room, no distress noted.

## 2020-10-12 NOTE — ED Provider Notes (Signed)
[facility] Emergency Department Provider Note  ____________________________________________  Time seen: Approximately 2:39 AM  I have reviewed the triage vital signs and the nursing notes.   HISTORY  Chief Complaint Chest Pain   HPI Darren Rogers is a 65 y.o. male with a history of A. fib on Eliquis, hypertension, CAD, smoking, alcohol abuse who presents for evaluation of chest pain.  Patient reports that he started having chest pain this evening.  Describes the chest pain as an elephant sitting on his chest associated with shortness of breath and palpitations.  Endorses compliance with his medications.  Patient reports having several beers this evening.  Denies any history of PE or DVT, leg pain or swelling, pleuritic chest pain, or any missed doses of his Eliquis.  Denies any recent illness, cough, fever, vomiting or diarrhea.   Patient found to be in A. fib with RVR per EMS with rates up to 190.  Received 5 mg of metoprolol.  Patient took 1 baby aspirin before EMS arrived  Past Medical History:  Diagnosis Date  . Claudication (HCC)    a. 06/2015 ABI: R - 0.73, L - 0.73. 30-49% bilat SFA stenosis. 50-74% R Profunda stenosis; b. 01/2017 ABI: R 0.89, L 0.88, TBI R 0.65, L 1.0.  . Erectile dysfunction   . Essential hypertension   . History of echocardiogram    a. 03/29/2014: EF 55-60%, mild LVH, normal RVSP;  b. 05/2015 Echo: EF 60-65%, triv AI, mild MR; c. 07/2020 Echo:   Marland Kitchen Hyperlipidemia   . Non-obstructive CAD    a. 01/27/2014 Cath: LM nl, LAD mild diff dz w/o obs, LCx no sig obs - scattered 20-30% mLCx, RCA no obs dz, EF 70%; b. 06/2015 MV; EF 60%, no ischemia.  Marland Kitchen PAF (paroxysmal atrial fibrillation) (HCC)    a. Pt says Dx >34yrs ago w/ ? RFCA @ [facility];  b. Recurrent 12/2013;  b. Rx Flecainide and Xarelto-->Intermittent compliance.  Marland Kitchen PVC's (premature ventricular contractions)    a. 05/2017 Zio: 4000 PVCs in 48 hrs (2%). Brief run of SVT.  . Tobacco abuse    a.  ongoing - 1 ppd.    Patient Active Problem List   Diagnosis Date Noted  . Atrial fibrillation with RVR (HCC) 08/06/2020  . Demand ischemia (HCC)   . Polysubstance abuse (HCC)   . Rapid atrial fibrillation (HCC) 08/05/2020  . Alcohol use disorder, moderate, dependence (HCC) 08/05/2020  . Cocaine abuse (HCC) 08/05/2020  . Alcohol abuse 10/20/2019  . Depression 10/20/2019  . A-fib (HCC) 01/15/2017  . Personal history of tobacco use, presenting hazards to health 08/17/2016  . BPH associated with nocturia 07/30/2016  . IFG (impaired fasting glucose) 07/30/2016  . Chest pain 08/22/2015  . Essential hypertension   . PAD (peripheral artery disease) (HCC)   . 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 CAD in native artery 04/11/2014  . Paroxysmal atrial fibrillation (HCC)   . Tobacco abuse   . Hyperlipidemia   . Atrial fibrillation with rapid ventricular response (HCC) 01/27/2014    Past Surgical History:  Procedure Laterality Date  . CARDIAC CATHETERIZATION    . CARD[facility]AC CATHETERIZATION     [facility]  . LEFT HEART CATH Right 01/27/2014   Procedure: LEFT HEART CATH;  Surgeon: Micheline Chapman, MD;  Location: [facility] CATH LAB;  Service: Cardiovascular;  Laterality: Right;    Prior to Admission medications   Medication Sig Start Date End Date Taking? Authorizing Provider  apixaban (ELIQUIS) 5 MG TABS tablet  Take 1 tablet (5 mg total) by mouth 2 (two) times daily. 09/12/20   Zannie Cove, MD  diltiazem (CARDIZEM CD) 240 MG 24 hr capsule Take 1 capsule (240 mg total) by mouth daily. 09/12/20   Zannie Cove, MD  metoprolol succinate (TOPROL-XL) 100 MG 24 hr tablet Take 1 tablet (100 mg total) by mouth daily. Take with or immediately following a meal. 09/12/20   Zannie Cove, MD  rosuvastatin (CRESTOR) 20 MG tablet Take 1 tablet (20 mg total) by mouth daily for 14 days. 09/12/20 09/26/20  Zannie Cove, MD    Allergies Patient has no known allergies.  Family History  Problem Relation  Age of Onset  . Coronary artery disease Father 55  . Diabetes Mother   . Diabetes Sister   . Diabetes Brother   . Diabetes Maternal Grandmother   . Diabetes Sister   . Diabetes Sister     Social History Social History   Tobacco Use  . Smoking status: Current Every Day Smoker    Packs/day: 0.50    Years: 30.00    Pack years: 15.00    Types: Cigarettes  . Smokeless tobacco: Never Used  Substance Use Topics  . Alcohol use: Yes    Alcohol/week: 12.0 standard drinks    Types: 12 Cans of beer per week  . Drug use: Yes    Types: Cocaine    Review of Systems  Constitutional: Negative for fever. Eyes: Negative for visual changes. ENT: Negative for sore throat. Neck: No neck pain  Cardiovascular: + chest pain. Respiratory: + shortness of breath. Gastrointestinal: Negative for abdominal pain, vomiting or diarrhea. Genitourinary: Negative for dysuria. Musculoskeletal: Negative for back pain. Skin: Negative for rash. Neurological: Negative for headaches, weakness or numbness. Psych: No SI or HI  ____________________________________________   PHYSICAL EXAM:  VITAL SIGNS: ED Triage Vitals  Enc Vitals Group     BP 10/12/20 0226 112/80     Pulse Rate 10/12/20 0226 (!) 108     Resp 10/12/20 0226 16     Temp 10/12/20 0226 97.9 F (36.6 C)     Temp Source 10/12/20 0226 Oral     SpO2 10/12/20 0226 98 %     Weight 10/12/20 0228 175 lb (79.4 kg)     Height 10/12/20 0228 5\' 6"  (1.676 m)     Head Circumference --      Peak Flow --      Pain Score 10/12/20 0227 4     Pain Loc --      Pain Edu? --      Excl. in GC? --     Constitutional: Alert and oriented. Well appearing and in no apparent distress. HEENT:      Head: Normocephalic and atraumatic.         Eyes: Conjunctivae are normal. Sclera is non-icteric.       Mouth/Throat: Mucous membranes are moist.       Neck: Supple with no signs of meningismus. Cardiovascular: Irregularly irregular rhythm with tachycardic  rate Respiratory: Normal respiratory effort. Lungs are clear to auscultation bilaterally.  Gastrointestinal: Soft, non tender, and non distended. Musculoskeletal:  No edema, cyanosis, or erythema of extremities. Neurologic: Normal speech and language. Face is symmetric. Moving all extremities. No gross focal neurologic deficits are appreciated. Skin: Skin is warm, dry and intact. No rash noted. Psychiatric: Mood and affect are normal. Speech and behavior are normal.  ____________________________________________   LABS (all labs ordered are listed, but only abnormal results are displayed)  Labs Reviewed  BASIC METABOLIC PANEL - Abnormal; Notable for the following components:      Result Value   CO2 19 (*)    All other components within normal limits  ETHANOL - Abnormal; Notable for the following components:   Alcohol, Ethyl (B) 73 (*)    All other components within normal limits  RESP PANEL BY RT-PCR (FLU A&B, COVID) ARPGX2  MAGNESIUM  CBC WITH DIFFERENTIAL/PLATELET  URINE DRUG SCREEN, QUALITATIVE ([facility] ONLY)  TROPONIN I (HIGH SENSITIVITY)   ____________________________________________  EKG  ED ECG REPORT I, Nita Sickle, the attending physician, personally viewed and interpreted this ECG.  A. fib with rate of 124, normal intervals, no ST elevations, T wave inversions in inferior and lateral leads.  Unchanged from prior. ____________________________________________  RADIOLOGY  I have personally reviewed the images performed during this visit and I agree with the Radiologist's read.   Interpretation by Radiologist:  DG Chest Portable 1 View  Result Date: 10/12/2020 CLINICAL DATA:  Chest pain.  RVR.  Alcohol. EXAM: PORTABLE CHEST 1 VIEW COMPARISON:  Chest x-ray 09/24/2020, CT chest 08/17/2016 FINDINGS: Cardiac paddles overlie the patient. The heart size and mediastinal contours are unchanged. Aortic arch calcification. No focal consolidation. No pulmonary edema. No  pleural effusion. No pneumothorax. No acute osseous abnormality. IMPRESSION: No active disease. Electronically Signed   By: Tish Frederickson M.D.   On: 10/12/2020 03:26      ____________________________________________   PROCEDURES  Procedure(s) performed:yes .1-3 Lead EKG Interpretation Performed by: Nita Sickle, MD Authorized by: Nita Sickle, MD     Interpretation: abnormal     ECG rate assessment: tachycardic     Rhythm: atrial fibrillation     Conduction: normal     Critical Care performed: yes  CRITICAL CARE Performed by: Nita Sickle  ?  Total critical care time: 35 min  Critical care time was exclusive of separately billable procedures and treating other patients.  Critical care was necessary to treat or prevent imminent or life-threatening deterioration.  Critical care was time spent personally by me on the following activities: development of treatment plan with patient and/or surrogate as well as nursing, discussions with consultants, evaluation of patient's response to treatment, examination of patient, obtaining history from patient or surrogate, ordering and performing treatments and interventions, ordering and review of laboratory studies, ordering and review of radiographic studies, pulse oximetry and re-evaluation of patient's condition.  ____________________________________________   INITIAL IMPRESSION / ASSESSMENT AND PLAN / ED COURSE  65 y.o. male with a history of A. fib on Eliquis, hypertension, CAD, smoking, alcohol abuse who presents for evaluation of chest pain and shortness of breath in the setting of A. fib with RVR.  Patient with several similar presentations in the past.  EKG showing A. fib with RVR with no acute ischemic changes.  Patient received 5 mg of metoprolol per EMS prior to arrival.  He is otherwise well-appearing.  Will check labs to rule out electrolyte derangements, dehydration, alcoholic ketoacidosis as possible  provoking events for his RVR. Will give IV magnesium, IV cardizem, and gentle IV hydration.  Old medical records reviewed including echo from this year which shows normal EF.  We will cycle cardiac enzymes.  We will give patient's p.o. dose of Cardizem after an IV bolus.  Patient placed on telemetry for monitoring of cardiorespiratory status.  Old medical records reviewed.  _________________________ 3:53 AM on 10/12/2020 -----------------------------------------  Patient remains in RVR after receiving IV Cardizem.  Will start drip.  First troponin  is negative.  No significant electrolyte derangements.  Will consult hospitalist for admission.     _____________________________________________ Please note:  Patient was evaluated in Emergency Department today for the symptoms described in the history of present illness. Patient was evaluated in the context of the global COVID-19 pandemic, which necessitated consideration that the patient might be at risk for infection with the SARS-CoV-2 virus that causes COVID-19. Institutional protocols and algorithms that pertain to the evaluation of patients at risk for COVID-19 are in a state of rapid change based on information released by regulatory bodies including the CDC and federal and state organizations. These policies and algorithms were followed during the patient's care in the ED.  Some ED evaluations and interventions may be delayed as a result of limited staffing during the pandemic.   [place] Controlled Substance Database was reviewed by me. ____________________________________________   FINAL CLINICAL IMPRESSION(S) / ED DIAGNOSES   Final diagnoses:  Atrial fibrillation with RVR (HCC)      NEW MEDICATIONS STARTED DURING THIS VISIT:  ED Discharge Orders    None       Note:  This document was prepared using Dragon voice recognition software and may include unintentional dictation errors.    Don Perking, [place], MD 10/12/20 [phone]

## 2020-10-12 NOTE — ED Notes (Signed)
Patient resting in room, eyes closed.

## 2020-10-12 NOTE — H&P (Signed)
History and Physical    Darren Rogers SRP:[id number] DOB: 1956/01/17 DOA: 10/12/2020  PCP: Patient, No Pcp Per (Inactive)   Patient coming from: Home  I have personally briefly reviewed patient's old medical records in [facility]  Chief Complaint: Chest pain  HPI: Darren Rogers is a 65 y.o. male with medical history significant for paroxysmal A. fib on Eliquis, moderate alcohol use disorder, CAD and hypertension who presents to the emergency room with a complaint of chest pain and palpitations.  Patient was hospitalized a couple weeks prior with rapid A. fib and he states the pain is similar.  He denies nausea vomiting or diaphoresis, denies cough, fever or chills denies shortness of breath. ED Course: On arrival in rapid A. fib with rate of 124, BP 112/80 O2 sat 98% on room air, afebrile.  Troponin 6, EtOH level 73, potassium and mag okay and other labs unremarkable. EKG as reviewed by me : A. fib with rate of 124 Imaging: No active disease  Patient received a diltiazem bolus followed by diltiazem infusion.  Hospitalist consulted for admission  Review of Systems: As per HPI otherwise all other systems on review of systems negative.    Past Medical History:  Diagnosis Date  . Claudication (HCC)    a. 06/2015 ABI: R - 0.73, L - 0.73. 30-49% bilat SFA stenosis. 50-74% R Profunda stenosis; b. 01/2017 ABI: R 0.89, L 0.88, TBI R 0.65, L 1.0.  . Erectile dysfunction   . Essential hypertension   . History of echocardiogram    a. 03/29/2014: EF 55-60%, mild LVH, normal RVSP;  b. 05/2015 Echo: EF 60-65%, triv AI, mild MR; c. 07/2020 Echo:   Marland Kitchen Hyperlipidemia   . Non-obstructive CAD    a. 01/27/2014 Cath: LM nl, LAD mild diff dz w/o obs, LCx no sig obs - scattered 20-30% mLCx, RCA no obs dz, EF 70%; b. 06/2015 MV; EF 60%, no ischemia.  Marland Kitchen PAF (paroxysmal atrial fibrillation) (HCC)    a. Pt says Dx >37yrs ago w/ ? RFCA @ [facility];  b. Recurrent 12/2013;  b. Rx Flecainide and Xarelto-->Intermittent  compliance.  Marland Kitchen PVC's (premature ventricular contractions)    a. 05/2017 Zio: 4000 PVCs in 48 hrs (2%). Brief run of SVT.  . Tobacco abuse    a. ongoing - 1 ppd.    Past Surgical History:  Procedure Laterality Date  . CARDIAC CATHETERIZATION    . CARD[facility]AC CATHETERIZATION     [facility]  . LEFT HEART CATH Right 01/27/2014   Procedure: LEFT HEART CATH;  Surgeon: Micheline Chapman, MD;  Location: [facility] CATH LAB;  Service: Cardiovascular;  Laterality: Right;     reports that he has been smoking cigarettes. He has a 15.00 pack-year smoking history. He has never used smokeless tobacco. He reports current alcohol use of about 12.0 standard drinks of alcohol per week. He reports current drug use. Drug: Cocaine.  No Known Allergies  Family History  Problem Relation Age of Onset  . Coronary artery disease Father 75  . Diabetes Mother   . Diabetes Sister   . Diabetes Brother   . Diabetes Maternal Grandmother   . Diabetes Sister   . Diabetes Sister       Prior to Admission medications   Medication Sig Start Date End Date Taking? Authorizing Provider  apixaban (ELIQUIS) 5 MG TABS tablet Take 1 tablet (5 mg total) by mouth 2 (two) times daily. 09/12/20   Zannie Cove, MD  diltiazem (CARDIZEM CD) 240 MG 24  hr capsule Take 1 capsule (240 mg total) by mouth daily. 09/12/20   Zannie Cove, MD  metoprolol succinate (TOPROL-XL) 100 MG 24 hr tablet Take 1 tablet (100 mg total) by mouth daily. Take with or immediately following a meal. 09/12/20   Zannie Cove, MD  rosuvastatin (CRESTOR) 20 MG tablet Take 1 tablet (20 mg total) by mouth daily for 14 days. 09/12/20 09/26/20  Zannie Cove, MD    Physical Exam: Vitals:   10/12/20 0300 10/12/20 0400 10/12/20 0410 10/12/20 0440  BP: 119/84 125/81 (!) 129/98 128/77  Pulse: 99 92 (!) 106 80  Resp: 17 17 (!) 21 15  Temp:      TempSrc:      SpO2: 100% 100% 99% 100%  Weight:      Height:         Vitals:   10/12/20 0300 10/12/20 0400 10/12/20 0410  10/12/20 0440  BP: 119/84 125/81 (!) 129/98 128/77  Pulse: 99 92 (!) 106 80  Resp: 17 17 (!) 21 15  Temp:      TempSrc:      SpO2: 100% 100% 99% 100%  Weight:      Height:          Constitutional: Alert and oriented x 3 . Not in any apparent distress HEENT:      Head: Normocephalic and atraumatic.         Eyes: PERLA, EOMI, Conjunctivae are normal. Sclera is non-icteric.       Mouth/Throat: Mucous membranes are moist.       Neck: Supple with no signs of meningismus. Cardiovascular:  Irregular tachycardic. No murmurs, gallops, or rubs. 2+ symmetrical distal pulses are present . No JVD. No LE edema Respiratory: Respiratory effort normal .Lungs sounds clear bilaterally. No wheezes, crackles, or rhonchi.  Gastrointestinal: Soft, non tender, and non distended with positive bowel sounds.  Genitourinary: No CVA tenderness. Musculoskeletal: Nontender with normal range of motion in all extremities. No cyanosis, or erythema of extremities. Neurologic:  Face is symmetric. Moving all extremities. No gross focal neurologic deficits . Skin: Skin is warm, dry.  No rash or ulcers Psychiatric: Mood and affect are normal    Labs on Admission: I have personally reviewed following labs and imaging studies  CBC: Recent Labs  Lab 10/12/20 0232  WBC 6.4  NEUTROABS 2.1  HGB 14.0  HCT 40.1  MCV 91.3  PLT 227   Basic Metabolic Panel: Recent Labs  Lab 10/12/20 0232  NA 136  K 3.5  CL 107  CO2 19*  GLUCOSE 78  BUN 13  CREATININE 0.97  CALCIUM 9.1  MG 2.0   GFR: Estimated Creatinine Clearance: 75.2 mL/min (by C-G formula based on SCr of 0.97 mg/dL). Liver Function Tests: No results for input(s): AST, ALT, ALKPHOS, BILITOT, PROT, ALBUMIN in the last 168 hours. No results for input(s): LIPASE, AMYLASE in the last 168 hours. No results for input(s): AMMONIA in the last 168 hours. Coagulation Profile: No results for input(s): INR, PROTIME in the last 168 hours. Cardiac Enzymes: No  results for input(s): CKTOTAL, CKMB, CKMBINDEX, TROPONINI in the last 168 hours. BNP (last 3 results) No results for input(s): PROBNP in the last 8760 hours. HbA1C: No results for input(s): HGBA1C in the last 72 hours. CBG: No results for input(s): GLUCAP in the last 168 hours. Lipid Profile: No results for input(s): CHOL, HDL, LDLCALC, TRIG, CHOLHDL, LDLDIRECT in the last 72 hours. Thyroid Function Tests: No results for input(s): TSH, T4TOTAL, FREET4, T3FREE, THYROIDAB  in the last 72 hours. Anemia Panel: No results for input(s): VITAMINB12, FOLATE, FERRITIN, TIBC, IRON, RETICCTPCT in the last 72 hours. Urine analysis:    Component Value Date/Time   COLORURINE YELLOW (A) 11/16/2014 1255   APPEARANCEUR Clear 07/30/2016 1600   LABSPEC 1.021 11/16/2014 1255   LABSPEC 1.019 03/28/2014 1430   PHURINE 5.0 11/16/2014 1255   GLUCOSEU Negative 07/30/2016 1600   GLUCOSEU Negative 03/28/2014 1430   HGBUR NEGATIVE 11/16/2014 1255   BILIRUBINUR Negative 07/30/2016 1600   BILIRUBINUR Negative 03/28/2014 1430   KETONESUR NEGATIVE 11/16/2014 1255   PROTEINUR Negative 07/30/2016 1600   PROTEINUR NEGATIVE 11/16/2014 1255   NITRITE Negative 07/30/2016 1600   NITRITE NEGATIVE 11/16/2014 1255   LEUKOCYTESUR Negative 07/30/2016 1600   LEUKOCYTESUR Negative 03/28/2014 1430    Radiological Exams on Admission: DG Chest Portable 1 View  Result Date: 10/12/2020 CLINICAL DATA:  Chest pain.  RVR.  Alcohol. EXAM: PORTABLE CHEST 1 VIEW COMPARISON:  Chest x-ray 09/24/2020, CT chest 08/17/2016 FINDINGS: Cardiac paddles overlie the patient. The heart size and mediastinal contours are unchanged. Aortic arch calcification. No focal consolidation. No pulmonary edema. No pleural effusion. No pneumothorax. No acute osseous abnormality. IMPRESSION: No active disease. Electronically Signed   By: Tish Frederickson M.D.   On: 10/12/2020 03:26     Assessment/Plan Active Problems: 65 year old male with history of  paroxysmal A. fib on Eliquis, moderate alcohol use disorder, CAD and hypertension who presents to the emergency room with a complaint of chest pain and palpitations.    Chest pain, with history of CAD - Troponin negative and EKG with no acute ST-T wave changes - Likely demand ischemia secondary to rapid A. fib - Chest pain improved with rate control measures in the ED - Continue  Rosuvastatin, can start home metoprolol when weaned off diltiazem infusion    Atrial fibrillation with rapid ventricular response (HCC) - Continue diltiazem infusion and transition to p.o. per protocol - Continue Eliquis    Essential hypertension - Continue home meds    Alcohol use disorder, moderate, dependence (HCC) - CIWA withdrawal protocol    DVT prophylaxis: Eliquis Code Status: full code  Family Communication:  none  Disposition Plan: Back to previous home environment Consults called: none  Status:At the time of admission, it appears that the appropriate admission status for this patient is INPATIENT. This is judged to be reasonable and necessary in order to provide the required intensity of service to ensure the patient's safety given the presenting symptoms, physical exam findings, and initial radiographic and laboratory data in the context of their  Comorbid conditions.   Patient requires inpatient status due to high intensity of service, high risk for further deterioration and high frequency of surveillance required.   I certify that at the point of admission it is my clinical judgment that the patient will require inpatient hospital care spanning beyond 2 midnights     Andris Baumann MD [facility]     10/12/2020, 4:59 AM

## 2020-10-12 NOTE — Progress Notes (Signed)
CCMD called to report 2.48 sec pause on tele/ pt asymptomatic/ MD made aware

## 2020-10-12 NOTE — ED Triage Notes (Signed)
Pt BIBA via [facility] from home c/o of chest pain in RVR with HR going up to 180-190. EtOH on board. Pt has hx of afib and takes metoprolol, eliquis, diltizam. Pt states chest pain is 3/10. Pt also received 5 mg metoprolol IV PTA.

## 2020-10-12 NOTE — Hospital Course (Addendum)
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 fib RVR diagnosed 01/27/2014-questionable compliance on medication-Per one of the discharge summaries could not afford the co-pay for Xarelto and started on Eliquis because this was more affordable and was supposed to follow-up with Dr. Kirke Corin Prediabetes Continued tobacco abuse HLD HTN Prior chronic ethanolism  3 recent admissions 08/06/2020, 09/12/2020, 09/24/2020 all for A. fib RVR  Presented from home to [facility] regional ER with chest pain with A. fib RVR heart rates in the 180s apparent recent EtOH  Ethanol level 73 potassium mag within normal limits started on Cardizem gtt.-in the emergency room apparently chest pain controlled once rate was controlled

## 2020-10-12 NOTE — Progress Notes (Addendum)
Patient seen and examined and agree with plan of care as per my partner Dr. Jerrye Beavers  74 black male Anterior septal MI status post left heart cath 2015 Permanent A. fib RVR diagnosed 01/27/2014-questionable compliance on medication-Per one of the discharge summaries could not afford the co-pay for Xarelto and started on Eliquis because this was more affordable and was supposed to follow-up with Dr. Kirke Corin Prediabetes Continued tobacco abuse HLD HTN Prior chronic ethanolism  3 recent admissions 08/06/2020, 09/12/2020, 09/24/2020 all for A. fib RVR  Presented from home to [facility] regional ER with chest pain with A. fib RVR heart rates in the 180s apparent recent EtOH  Ethanol level 73 potassium mag within normal limits started on Cardizem gtt.-in the emergency room apparently chest pain controlled once rate was controlled  On exam patient is comfortable asking to sit up in the bed and eat No chest pain whatsoever S1-S2 slightly tachycardic rate controlled No rales no rhonchi chest is clear No icterus No thyromegaly no proptosis No lower extremity edema  Plan Patient is on Cardizem gtt. which we will transition to his home dose of 240 p.o. Cardizem and Toprol-XL 100 If he stays rate controlled, he likely can discharge later today or early tomorrow morning I will check a magnesium in the morning We will keep him on the alcohol CIWA protocol to ensure he does not withdrawal as his alcohol level was 73 He is a moderate risk for bleeding since he is on anticoagulation as well as with his continued drinking  I did briefly discuss his case with Dr. Elberta Fortis of Cardiology--since patient has permanent AFIB and has intermittent med compliance, it is unlikely we would Cardiovert him.   Pleas Koch, MD [facility] 10:11 AM

## 2020-10-12 NOTE — Progress Notes (Signed)
pts hr has improved ( HR 50-60s) / cardizem gtt stopped per MD

## 2020-10-13 DIAGNOSIS — I4891 Unspecified atrial fibrillation: Secondary | ICD-10-CM | POA: Diagnosis not present

## 2020-10-13 LAB — COMPREHENSIVE METABOLIC PANEL
ALT: 15 U/L (ref 0–44)
AST: 17 U/L (ref 15–41)
Albumin: 3.5 g/dL (ref 3.5–5.0)
Alkaline Phosphatase: 63 U/L (ref 38–126)
Anion gap: 7 (ref 5–15)
BUN: 19 mg/dL (ref 8–23)
CO2: 25 mmol/L (ref 22–32)
Calcium: 9 mg/dL (ref 8.9–10.3)
Chloride: 107 mmol/L (ref 98–111)
Creatinine, Ser: 1.08 mg/dL (ref 0.61–1.24)
GFR, Estimated: >60 mL/min (ref >60)
Glucose, Bld: 108 mg/dL — ABNORMAL HIGH (ref 70–99)
Potassium: 4.6 mmol/L (ref 3.5–5.1)
Sodium: 139 mmol/L (ref 135–145)
Total Bilirubin: 0.5 mg/dL (ref 0.3–1.2)
Total Protein: 6.4 g/dL — ABNORMAL LOW (ref 6.5–8.1)

## 2020-10-13 LAB — MAGNESIUM: Magnesium: 2.1 mg/dL (ref 1.7–2.4)

## 2020-10-13 MED ORDER — FOLIC ACID 1 MG PO TABS: 1.0000 mg | ORAL_TABLET | Freq: Every day | ORAL | 3 refills | Status: DC

## 2020-10-13 NOTE — Care Management Obs Status (Signed)
MEDICARE OBSERVATION STATUS NOTIFICATION   Patient Details  Name: Darren Rogers MRN: [id number] Date of Birth: 05-Apr-1956   Medicare Observation Status Notification Given:  Yes    Chapman Fitch, RN 10/13/2020, 10:46 AM

## 2020-10-13 NOTE — TOC Initial Note (Signed)
Transition of Care [facility]) - Initial/Assessment Note    Patient Details  Name: Darren Rogers MRN: [id number] Date of Birth: [birth year]  Transition of [facility]) CM/SW Contact:    Chapman Fitch, RN Phone Number: 10/13/2020, 11:33 AM  Clinical Narrative:                 Patient admitted from home with afib Patient stats that he lives at home with his sister   Patient states that he does not have a PCP.  I offered to arranged a new patient PCP appointment.  Patient declines.  Patient states he already has paper work at home that he is working on to getting a PCP  Patient states at baseline he is able to drive  [employer].  Patient states that his eliquis is $370, but that he was able to get it last month for  $25.  Patient confirms that he still has some at home.  I inquired if patient has applied for patient assistance. He states he has not.  I have provided patient with information. I attempted to call his pharmacy to to confirm the price, however they are closed today  I have notified MD, and discussed with pharmacist.  The plan will be for patient ton continue his home supply of eliquis.  And follow up with Dr Kirke Corin.   TOC consult for alcohol use. Patient states that he drinks 40 oz of beer daily.  MD has counseled patient on alcohol use. Patient declines any substance abuse resources.  States that he worked for [employer] for 8 years, and is aware of all of the local resources.  Expected Discharge Plan: Home/Self Care Barriers to Discharge: No Barriers Identified   Patient Goals and CMS Choice        Expected Discharge Plan and Services Expected Discharge Plan: Home/Self Care   Discharge Planning Services: CM Consult   Living arrangements for the past 2 months: Apartment Expected Discharge Date: 10/13/20                                    Prior Living Arrangements/Services Living arrangements for the past 2 months: Apartment Lives with:: Self Patient  language and need for interpreter reviewed:: Yes Do you feel safe going back to the place where you live?: Yes      Need for Family Participation in Patient Care: No (Comment) Care giver support system in place?: Yes (comment)   Criminal Activity/Legal Involvement Pertinent to Current Situation/Hospitalization: No - Comment as needed  Activities of Daily Living Home Assistive Devices/Equipment: None ADL Screening (condition at time of admission) Patient's cognitive ability adequate to safely complete daily activities?: Yes Is the patient deaf or have difficulty hearing?: No Does the patient have difficulty seeing, even when wearing glasses/contacts?: No Does the patient have difficulty concentrating, remembering, or making decisions?: No Patient able to express need for assistance with ADLs?: Yes Does the patient have difficulty dressing or bathing?: No Independently performs ADLs?: Yes (appropriate for developmental age) Does the patient have difficulty walking or climbing stairs?: No Weakness of Legs: None Weakness of Arms/Hands: None  Permission Sought/Granted                  Emotional Assessment Appearance:: Appears stated age     Orientation: : Oriented to Self,Oriented to Place,Oriented to  Time,Oriented to Situation Alcohol / Substance Use: Alcohol Use Psych Involvement: No (comment)  Admission diagnosis:  Rapid atrial fibrillation (HCC) [I48.91] Atrial fibrillation with RVR (HCC) [I48.91] Patient Active Problem List   Diagnosis Date Noted  . Atrial fibrillation with RVR (HCC) 08/06/2020  . Demand ischemia (HCC)   . Polysubstance abuse (HCC)   . Rapid atrial fibrillation (HCC) 08/05/2020  . Alcohol use disorder, moderate, dependence (HCC) 08/05/2020  . Cocaine abuse (HCC) 08/05/2020  . Alcohol abuse 10/20/2019  . Depression 10/20/2019  . A-fib (HCC) 01/15/2017  . Personal history of tobacco use, presenting hazards to health 08/17/2016  . BPH associated with  nocturia 07/30/2016  . IFG (impaired fasting glucose) 07/30/2016  . Chest pain 08/22/2015  . Essential hypertension   . PAD (peripheral artery disease) (HCC)   . 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 CAD in native artery 04/11/2014  . Paroxysmal atrial fibrillation (HCC)   . Tobacco abuse   . Hyperlipidemia   . Atrial fibrillation with rapid ventricular response (HCC) 01/27/2014   PCP:  Patient, No Pcp Per (Inactive) Pharmacy:   [facility] [address] [place], [place] - [address] [place] [address] Phone: [phone] Fax: [phone]  [facility] [address] [place] [address] Phone: [phone] Fax: [phone]     Social Determinants of Health (SDOH) Interventions    Readmission Risk Interventions Readmission Risk Prevention Plan 10/13/2020  Transportation Screening Complete  PCP or Specialist Appt within 3-5 Days (No Data)  Palliative Care Screening Not Applicable  Medication Review (RN Care Manager) Complete  Some recent data might be hidden

## 2020-10-13 NOTE — Plan of Care (Signed)
Pt ready for discharge IV and tele removed.  Discharge instructions reviewed with patient Time allowed for questions and concerns Verbalizes an understanding on all teachings Denies any additional wants or needs.  Pt awaiting on transportation home   Problem: Education: Goal: Knowledge of General Education information will improve Description: Including pain rating scale, medication(s)/side effects and non-pharmacologic comfort measures Outcome: Adequate for Discharge   Problem: Health Behavior/Discharge Planning: Goal: Ability to manage health-related needs will improve Outcome: Adequate for Discharge   Problem: Clinical Measurements: Goal: Ability to maintain clinical measurements within normal limits will improve Outcome: Adequate for Discharge Goal: Will remain free from infection Outcome: Adequate for Discharge Goal: Diagnostic test results will improve Outcome: Adequate for Discharge Goal: Respiratory complications will improve Outcome: Adequate for Discharge Goal: Cardiovascular complication will be avoided Outcome: Adequate for Discharge   Problem: Activity: Goal: Risk for activity intolerance will decrease Outcome: Adequate for Discharge   Problem: Nutrition: Goal: Adequate nutrition will be maintained Outcome: Adequate for Discharge   Problem: Coping: Goal: Level of anxiety will decrease Outcome: Adequate for Discharge   Problem: Elimination: Goal: Will not experience complications related to bowel motility Outcome: Adequate for Discharge Goal: Will not experience complications related to urinary retention Outcome: Adequate for Discharge   Problem: Pain Managment: Goal: General experience of comfort will improve Outcome: Adequate for Discharge   Problem: Safety: Goal: Ability to remain free from injury will improve Outcome: Adequate for Discharge   Problem: Skin Integrity: Goal: Risk for impaired skin integrity will decrease Outcome: Adequate for  Discharge   Problem: Education: Goal: Knowledge of General Education information will improve Description: Including pain rating scale, medication(s)/side effects and non-pharmacologic comfort measures Outcome: Adequate for Discharge   Problem: Health Behavior/Discharge Planning: Goal: Ability to manage health-related needs will improve Outcome: Adequate for Discharge   Problem: Clinical Measurements: Goal: Ability to maintain clinical measurements within normal limits will improve Outcome: Adequate for Discharge Goal: Will remain free from infection Outcome: Adequate for Discharge Goal: Diagnostic test results will improve Outcome: Adequate for Discharge Goal: Respiratory complications will improve Outcome: Adequate for Discharge Goal: Cardiovascular complication will be avoided Outcome: Adequate for Discharge   Problem: Activity: Goal: Risk for activity intolerance will decrease Outcome: Adequate for Discharge   Problem: Nutrition: Goal: Adequate nutrition will be maintained Outcome: Adequate for Discharge   Problem: Coping: Goal: Level of anxiety will decrease Outcome: Adequate for Discharge   Problem: Elimination: Goal: Will not experience complications related to bowel motility Outcome: Adequate for Discharge Goal: Will not experience complications related to urinary retention Outcome: Adequate for Discharge   Problem: Pain Managment: Goal: General experience of comfort will improve Outcome: Adequate for Discharge   Problem: Safety: Goal: Ability to remain free from injury will improve Outcome: Adequate for Discharge   Problem: Skin Integrity: Goal: Risk for impaired skin integrity will decrease Outcome: Adequate for Discharge   Problem: Education: Goal: Knowledge of disease or condition will improve Outcome: Adequate for Discharge Goal: Understanding of medication regimen will improve Outcome: Adequate for Discharge Goal: Individualized Educational  Video(s) Outcome: Adequate for Discharge   Problem: Activity: Goal: Ability to tolerate increased activity will improve Outcome: Adequate for Discharge   Problem: Cardiac: Goal: Ability to achieve and maintain adequate cardiopulmonary perfusion will improve Outcome: Adequate for Discharge   Problem: Health Behavior/Discharge Planning: Goal: Ability to safely manage health-related needs after discharge will improve Outcome: Adequate for Discharge

## 2020-10-13 NOTE — Care Management CC44 (Signed)
Condition Code 44 Documentation Completed  Patient Details  Name: Kolt Mcwhirter MRN: [id number] Date of Birth: 1956/03/14   Condition Code 44 given:  Yes Patient signature on Condition Code 44 notice:  Yes Documentation of 2 MD's agreement:  Yes Code 44 added to claim:  Yes    Chapman Fitch, RN 10/13/2020, 10:46 AM

## 2020-10-13 NOTE — Discharge Summary (Signed)
Physician Discharge Summary  Matheson Vandehei JJH:[id number] DOB: 02/26/1956 DOA: 10/12/2020  PCP: Patient, No Pcp Per (Inactive)  Admit date: 10/12/2020 Discharge date: 10/13/2020  Time spent: 27 minutes  Recommendations for Outpatient Follow-up:  1. Patient counseled to desist or cut back significantly on alcohol 2. Needs TOC visit with cardiology at the A. fib clinic etc. 3. Recommend magnesium Chem-12 in about 1 week post discharge  Discharge Diagnoses:  MAIN problem for hospitalization   Atrial fibrillation with RVR  Please see below for itemized issues addressed in HOpsital- refer to other progress notes for clarity if needed  Discharge Condition: Improved  Diet recommendation: Heart healthy  Filed Weights   10/12/20 0228 10/12/20 1340  Weight: 79.4 kg 68.9 kg    History of present illness:  74 black male Anterior septal MI status post left heart cath 2015 Permanent A. fib RVR diagnosed 01/27/2014-questionable compliance on medication-Per one of the discharge summaries could not afford the co-pay for Xarelto and started on Eliquis because this was more affordable and was supposed to follow-up with Dr. Kirke Corin Prediabetes Continued tobacco abuse HLD HTN Prior chronic ethanolism  3 recent admissions 08/06/2020, 09/12/2020, 09/24/2020 all for A. fib RVR  Presented from home to [facility] ER with chest pain with A. fib RVR heart rates in the 180s apparent recent EtOH  Ethanol level 73 potassium mag within normal limits started on Cardizem gtt.-in the emergency room apparently chest pain controlled once rate was controlled  Hospital Course:   Patient was transitioned rapidly off of the Cardizem gtt. and placed on home medications of Cardizem CD240 in addition to Toprol-XL 100 He experienced nonpersistent pauses of 2.4 seconds His magnesium and potassium are within normal limits in range He was felt of stabilized from his RVR and told to follow-up in the outpatient  clinic with Dr. Hilary Hertz of cardiology after my discussion with Dr. 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 fib control  Discharge Exam: Vitals:   10/13/20 0744 10/13/20 0942  BP: 135/83   Pulse: 62 94  Resp: 18   Temp: 98.6 F (37 C)   SpO2: 98%     Subj on day of d/c   Awake alert coherent no distress CTA B Eating drinking Pleasant Heart rates low 100s but has not received meds  General Exam on discharge  Coherent CTA B EOMI NCAT S1-S2 no murmur Chest clear no added sound Abdomen soft no rebound No lower extremity edema Neurological intact no focal deficit  Discharge Instructions   Discharge Instructions    Amb referral to AFIB Clinic   Complete by: As directed    Diet - low sodium heart healthy   Complete by: As directed    Discharge instructions   Complete by: As directed    Make sure you take your heart rate medications and Eliquis daily and do not miss any We talked a little bit on this hospital admission but you are cutting back or drinking-in addition to possibly being a driver for your atrial fibrillation, it is not good to drink excessively-try to cut back a little bit Please follow-up with atrial  fibrillation clinic and discuss your care with Dr. Bascom Levels who will be copied on this note and they will set up with transition visit with you Best of luck and take care   Increase activity slowly   Complete by: As directed    Increase activity slowly   Complete by: As directed      Allergies as of 10/13/2020   No Known Allergies     Medication List    TAKE these medications    apixaban 5 MG Tabs tablet Commonly known as: ELIQUIS Take 1 tablet (5 mg total) by mouth 2 (two) times daily.   diltiazem 240 MG 24 hr capsule Commonly known as: CARDIZEM CD Take 1 capsule (240 mg total) by mouth daily.   folic acid 1 MG tablet Commonly known as: FOLVITE Take 1 tablet (1 mg total) by mouth daily. Start taking on: Oct 14, 2020   metoprolol succinate 100 MG 24 hr tablet Commonly known as: TOPROL-XL Take 1 tablet (100 mg total) by mouth daily. Take with or immediately following a meal.   rosuvastatin 20 MG tablet Commonly known as: Crestor Take 1 tablet (20 mg total) by mouth daily for 14 days.      No Known Allergies    The results of significant diagnostics from this hospitalization (including imaging, microbiology, ancillary and laboratory) are listed below for reference.    Significant Diagnostic Studies: DG Chest Portable 1 View  Result Date: 10/12/2020 CLINICAL DATA:  Chest pain.  RVR.  Alcohol. EXAM: PORTABLE CHEST 1 VIEW COMPARISON:  Chest x-ray 09/24/2020, CT chest 08/17/2016 FINDINGS: Cardiac paddles overlie the patient. The heart size and mediastinal contours are unchanged. Aortic arch calcification. No focal consolidation. No pulmonary edema. No pleural effusion. No pneumothorax. No acute osseous abnormality. IMPRESSION: No active disease. Electronically Signed   By: Tish Frederickson M.D.   On: 10/12/2020 03:26   DG Chest Portable 1 View  Result Date: 09/24/2020 CLINICAL DATA:  Chest pain EXAM: PORTABLE CHEST 1 VIEW COMPARISON:  09/10/2020 FINDINGS: Heart size and pulmonary vascularity are normal. Lungs are clear. No pleural effusions. No pneumothorax. Old bilateral rib fractures. IMPRESSION: No active disease. Electronically Signed   By: Burman Nieves M.D.   On: 09/24/2020 00:40    Microbiology: Recent Results (from the past 240 hour(s))  Resp Panel by RT-PCR (Flu A&B, Covid) Nasopharyngeal Swab     Status: None   Collection Time: 10/12/20   3:54 AM   Specimen: Nasopharyngeal Swab; Nasopharyngeal(NP) swabs in vial transport medium  Result Value Ref Range Status   SARS Coronavirus 2 by RT PCR NEGATIVE NEGATIVE Final    Comment: (NOTE) SARS-CoV-2 target nucleic acids are NOT DETECTED.  The SARS-CoV-2 RNA is generally detectable in upper respiratory specimens during the acute phase of infection. The lowest concentration of SARS-CoV-2 viral copies this assay can detect is 138 copies/mL. A negative result does not preclude SARS-Cov-2 infection and should not be used as the sole basis for treatment or other patient management decisions. A negative result may occur with  improper specimen collection/handling, submission of specimen other than nasopharyngeal swab, presence of viral mutation(s) within the areas targeted by this assay, and inadequate number of viral copies(<138 copies/mL). A negative result must be combined with clinical observations, patient history, and epidemiological information. The expected result is Negative.  Fact Sheet for Patients:  BloggerCourse.com  Fact Sheet for Healthcare Providers:  SeriousBroker.it  This test is no t yet approved or cleared by the [place] and  has been authorized for detection and/or diagnosis of SARS-CoV-2 by FDA under an Emergency Use Authorization (EUA). This EUA will remain  in effect (meaning this test can be used) for the duration of the COVID-19 declaration under Section 564(b)(1) of the Act, 21 U.S.C.section 360bbb-3(b)(1), unless the authorization is terminated  or revoked sooner.       Influenza A by PCR NEGATIVE NEGATIVE Final   Influenza B by PCR NEGATIVE NEGATIVE Final    Comment: (NOTE) The Xpert Xpress SARS-CoV-2/FLU/RSV plus assay is intended as an aid in the diagnosis of influenza from Nasopharyngeal swab specimens and should not be used as a sole basis for treatment. Nasal washings and aspirates  are unacceptable for Xpert Xpress SARS-CoV-2/FLU/RSV testing.  Fact Sheet for Patients: BloggerCourse.com  Fact Sheet for Healthcare Providers: SeriousBroker.it  This test is not yet approved or cleared by the [place] FDA and has been authorized for detection and/or diagnosis of SARS-CoV-2 by FDA under an Emergency Use Authorization (EUA). This EUA will remain in effect (meaning this test can be used) for the duration of the COVID-19 declaration under Section 564(b)(1) of the Act, 21 U.S.C. section 360bbb-3(b)(1), unless the authorization is terminated or revoked.  Performed at [facility], [address] [place], [place] [address]      Labs: Basic Metabolic Panel: Recent Labs  Lab 10/12/20 0232 10/12/20 0422 10/13/20 0421  NA 136  --  139  K 3.5  --  4.6  CL 107  --  107  CO2 19*  --  25  GLUCOSE 78  --  108*  BUN 13  --  19  CREATININE 0.97  --  1.08  CALCIUM 9.1  --  9.0  MG 2.0  --  2.1  PHOS  --  4.0  --    Liver Function Tests: Recent Labs  Lab 10/13/20 0421  AST 17  ALT 15  ALKPHOS 63  BILITOT 0.5  PROT 6.4*  ALBUMIN 3.5   No results for input(s): LIPASE, AMYLASE in the last 168 hours. No results for input(s): AMMONIA in the last 168 hours. CBC: Recent Labs  Lab 10/12/20 0232  WBC 6.4  NEUTROABS 2.1  HGB 14.0  HCT 40.1  MCV 91.3  PLT 227   Cardiac Enzymes: No results for input(s): CKTOTAL, CKMB, CKMBINDEX, TROPONINI in the last 168 hours. BNP: BNP (last 3 results) Recent Labs    08/05/20 0420 08/12/20 0740  BNP 93.2 185.1*    ProBNP (last 3 results) No results for input(s): PROBNP in the last 8760 hours.  CBG: No results for input(s): GLUCAP in the last 168 hours.     Signed:  Rhetta Mura MD   [facility] 10/13/2020, 10:09 AM

## 2020-10-13 NOTE — Discharge Instructions (Signed)
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
## 2020-10-21 ENCOUNTER — Ambulatory Visit: Payer: Medicare Other | Admitting: Physician Assistant

## 2020-10-22 ENCOUNTER — Encounter: Payer: Self-pay | Admitting: Physician Assistant

## 2020-11-19 ENCOUNTER — Inpatient Hospital Stay
Admission: EM | Admit: 2020-11-19 | Discharge: 2020-11-20 | DRG: 158 | Disposition: A | Discharge status: Home / Self Care | Payer: Medicare Other | Attending: Student | Admitting: Student

## 2020-11-19 ENCOUNTER — Emergency Department: Payer: Medicare Other

## 2020-11-19 ENCOUNTER — Encounter: Payer: Self-pay | Admitting: Emergency Medicine

## 2020-11-19 DIAGNOSIS — K047 Periapical abscess without sinus: Secondary | ICD-10-CM | POA: Insufficient documentation

## 2020-11-19 DIAGNOSIS — Y900 Blood alcohol level of less than 20 mg/100 ml: Secondary | ICD-10-CM | POA: Diagnosis not present

## 2020-11-19 DIAGNOSIS — F1010 Alcohol abuse, uncomplicated: Secondary | ICD-10-CM | POA: Diagnosis not present

## 2020-11-19 DIAGNOSIS — L03211 Cellulitis of face: Secondary | ICD-10-CM | POA: Diagnosis not present

## 2020-11-19 DIAGNOSIS — I480 Paroxysmal atrial fibrillation: Secondary | ICD-10-CM | POA: Diagnosis present

## 2020-11-19 DIAGNOSIS — F1910 Other psychoactive substance abuse, uncomplicated: Secondary | ICD-10-CM | POA: Diagnosis not present

## 2020-11-19 DIAGNOSIS — E785 Hyperlipidemia, unspecified: Secondary | ICD-10-CM | POA: Diagnosis present

## 2020-11-19 DIAGNOSIS — F1410 Cocaine abuse, uncomplicated: Secondary | ICD-10-CM | POA: Diagnosis present

## 2020-11-19 DIAGNOSIS — F17210 Nicotine dependence, cigarettes, uncomplicated: Secondary | ICD-10-CM | POA: Diagnosis not present

## 2020-11-19 DIAGNOSIS — I4891 Unspecified atrial fibrillation: Secondary | ICD-10-CM | POA: Diagnosis present

## 2020-11-19 DIAGNOSIS — Z7901 Long term (current) use of anticoagulants: Secondary | ICD-10-CM | POA: Diagnosis not present

## 2020-11-19 DIAGNOSIS — I2510 Atherosclerotic heart disease of native coronary artery without angina pectoris: Secondary | ICD-10-CM | POA: Diagnosis not present

## 2020-11-19 DIAGNOSIS — Z833 Family history of diabetes mellitus: Secondary | ICD-10-CM | POA: Diagnosis not present

## 2020-11-19 DIAGNOSIS — Z8249 Family history of ischemic heart disease and other diseases of the circulatory system: Secondary | ICD-10-CM | POA: Diagnosis not present

## 2020-11-19 DIAGNOSIS — Z20822 Contact with and (suspected) exposure to covid-19: Secondary | ICD-10-CM | POA: Diagnosis not present

## 2020-11-19 DIAGNOSIS — I10 Essential (primary) hypertension: Secondary | ICD-10-CM | POA: Diagnosis not present

## 2020-11-19 DIAGNOSIS — Z79899 Other long term (current) drug therapy: Secondary | ICD-10-CM

## 2020-11-19 DIAGNOSIS — Z720 Tobacco use: Secondary | ICD-10-CM

## 2020-11-19 DIAGNOSIS — K122 Cellulitis and abscess of mouth: Secondary | ICD-10-CM | POA: Diagnosis present

## 2020-11-19 DIAGNOSIS — K0889 Other specified disorders of teeth and supporting structures: Secondary | ICD-10-CM | POA: Diagnosis present

## 2020-11-19 LAB — PROTIME-INR
INR: 1 (ref 0.8–1.2)
Prothrombin Time: 13.4 seconds (ref 11.4–15.2)

## 2020-11-19 LAB — CBC WITH DIFFERENTIAL/PLATELET
Abs Immature Granulocytes: 0.01 10*3/uL (ref 0.00–0.07)
Basophils Absolute: 0 10*3/uL (ref 0.0–0.1)
Basophils Relative: 1 %
Eosinophils Absolute: 0 10*3/uL (ref 0.0–0.5)
Eosinophils Relative: 1 %
HCT: 38 % — ABNORMAL LOW (ref 39.0–52.0)
Hemoglobin: 13.8 g/dL (ref 13.0–17.0)
Immature Granulocytes: 0 %
Lymphocytes Relative: 46 %
Lymphs Abs: 3 10*3/uL (ref 0.7–4.0)
MCH: 33.1 pg (ref 26.0–34.0)
MCHC: 36.3 g/dL — ABNORMAL HIGH (ref 30.0–36.0)
MCV: 91.1 fL (ref 80.0–100.0)
Monocytes Absolute: 0.6 10*3/uL (ref 0.1–1.0)
Monocytes Relative: 9 %
Neutro Abs: 2.7 10*3/uL (ref 1.7–7.7)
Neutrophils Relative %: 43 %
Platelets: 235 10*3/uL (ref 150–400)
RBC: 4.17 MIL/uL — ABNORMAL LOW (ref 4.22–5.81)
RDW: 16.8 % — ABNORMAL HIGH (ref 11.5–15.5)
WBC: 6.4 10*3/uL (ref 4.0–10.5)
nRBC: 0 % (ref 0.0–0.2)

## 2020-11-19 LAB — COMPREHENSIVE METABOLIC PANEL
ALT: 17 U/L (ref 0–44)
AST: 20 U/L (ref 15–41)
Albumin: 3.5 g/dL (ref 3.5–5.0)
Alkaline Phosphatase: 74 U/L (ref 38–126)
Anion gap: 10 (ref 5–15)
BUN: 9 mg/dL (ref 8–23)
CO2: 22 mmol/L (ref 22–32)
Calcium: 8.9 mg/dL (ref 8.9–10.3)
Chloride: 100 mmol/L (ref 98–111)
Creatinine, Ser: 0.97 mg/dL (ref 0.61–1.24)
GFR, Estimated: >60 mL/min (ref >60)
Glucose, Bld: 91 mg/dL (ref 70–99)
Potassium: 3.8 mmol/L (ref 3.5–5.1)
Sodium: 132 mmol/L — ABNORMAL LOW (ref 135–145)
Total Bilirubin: 0.4 mg/dL (ref 0.3–1.2)
Total Protein: 6.7 g/dL (ref 6.5–8.1)

## 2020-11-19 LAB — TSH: TSH: 3.259 u[IU]/mL (ref 0.350–4.500)

## 2020-11-19 LAB — URINE DRUG SCREEN, QUALITATIVE (ARMC ONLY)
Amphetamines, Ur Screen: NOT DETECTED
Barbiturates, Ur Screen: NOT DETECTED
Benzodiazepine, Ur Scrn: NOT DETECTED
Cannabinoid 50 Ng, Ur ~~LOC~~: NOT DETECTED
Cocaine Metabolite,Ur ~~LOC~~: NOT DETECTED
MDMA (Ecstasy)Ur Screen: NOT DETECTED
Methadone Scn, Ur: NOT DETECTED
Opiate, Ur Screen: POSITIVE — AB
Phencyclidine (PCP) Ur S: NOT DETECTED
Tricyclic, Ur Screen: NOT DETECTED

## 2020-11-19 LAB — C-REACTIVE PROTEIN: CRP: 3.5 mg/dL — ABNORMAL HIGH (ref <1.0)

## 2020-11-19 LAB — LACTIC ACID, PLASMA
Lactic Acid, Venous: 2 mmol/L (ref 0.5–1.9)
Lactic Acid, Venous: 1.4 mmol/L (ref 0.5–1.9)

## 2020-11-19 LAB — MAGNESIUM: Magnesium: 2.2 mg/dL (ref 1.7–2.4)

## 2020-11-19 LAB — RESP PANEL BY RT-PCR (FLU A&B, COVID) ARPGX2
Influenza A by PCR: NEGATIVE
Influenza B by PCR: NEGATIVE
SARS Coronavirus 2 by RT PCR: NEGATIVE

## 2020-11-19 LAB — CBC
HCT: 38.8 % — ABNORMAL LOW (ref 39.0–52.0)
Hemoglobin: 14.1 g/dL (ref 13.0–17.0)
MCH: 32.3 pg (ref 26.0–34.0)
MCHC: 36.3 g/dL — ABNORMAL HIGH (ref 30.0–36.0)
MCV: 88.8 fL (ref 80.0–100.0)
Platelets: 238 10*3/uL (ref 150–400)
RBC: 4.37 MIL/uL (ref 4.22–5.81)
RDW: 16.5 % — ABNORMAL HIGH (ref 11.5–15.5)
WBC: 6.2 10*3/uL (ref 4.0–10.5)
nRBC: 0 % (ref 0.0–0.2)

## 2020-11-19 LAB — ETHANOL: Alcohol, Ethyl (B): <10 mg/dL (ref <10)

## 2020-11-19 LAB — PHOSPHORUS: Phosphorus: 4.6 mg/dL (ref 2.5–4.6)

## 2020-11-19 LAB — APTT: aPTT: 27 seconds (ref 24–36)

## 2020-11-19 LAB — SEDIMENTATION RATE: Sed Rate: 7 mm/hr (ref 0–20)

## 2020-11-19 LAB — TROPONIN I (HIGH SENSITIVITY): Troponin I (High Sensitivity): 9 ng/L (ref <18)

## 2020-11-19 LAB — LIPASE, BLOOD: Lipase: 52 U/L — ABNORMAL HIGH (ref 11–51)

## 2020-11-19 MED ORDER — OXYCODONE-ACETAMINOPHEN 5-325 MG PO TABS: 1.0000 | ORAL_TABLET | ORAL | Status: DC | PRN

## 2020-11-19 MED ORDER — MORPHINE SULFATE (PF) 4 MG/ML IV SOLN
4.0000 mg | Freq: Once | INTRAVENOUS | Status: AC
  Administered 2020-11-19: 4 mg via INTRAVENOUS
  Filled 2020-11-19: qty 1

## 2020-11-19 MED ORDER — SODIUM CHLORIDE 0.9 % IV SOLN: INTRAVENOUS | Status: DC

## 2020-11-19 MED ORDER — ACETAMINOPHEN 325 MG PO TABS: 650.0000 mg | ORAL_TABLET | Freq: Four times a day (QID) | ORAL | Status: DC | PRN

## 2020-11-19 MED ORDER — METOPROLOL TARTRATE 5 MG/5ML IV SOLN
5.0000 mg | Freq: Once | INTRAVENOUS | Status: DC
  Filled 2020-11-19: qty 5

## 2020-11-19 MED ORDER — THIAMINE HCL 100 MG PO TABS
100.0000 mg | ORAL_TABLET | Freq: Every day | ORAL | Status: DC
  Administered 2020-11-20: 100 mg via ORAL
  Filled 2020-11-19 (×2): qty 1

## 2020-11-19 MED ORDER — SODIUM CHLORIDE 0.9 % IV SOLN
3.0000 g | Freq: Four times a day (QID) | INTRAVENOUS | Status: DC
  Administered 2020-11-19 – 2020-11-20 (×4): 3 g via INTRAVENOUS
  Filled 2020-11-19 (×4): qty 8

## 2020-11-19 MED ORDER — LACTATED RINGERS IV BOLUS
1000.0000 mL | Freq: Once | INTRAVENOUS | Status: AC
  Administered 2020-11-19: 1000 mL via INTRAVENOUS

## 2020-11-19 MED ORDER — NICOTINE 21 MG/24HR TD PT24
21.0000 mg | MEDICATED_PATCH | Freq: Every day | TRANSDERMAL | Status: DC
  Filled 2020-11-19: qty 1

## 2020-11-19 MED ORDER — APIXABAN 5 MG PO TABS
5.0000 mg | ORAL_TABLET | Freq: Two times a day (BID) | ORAL | Status: DC
  Administered 2020-11-19 (×2): 5 mg via ORAL
  Filled 2020-11-19 (×3): qty 1

## 2020-11-19 MED ORDER — DILTIAZEM HCL ER COATED BEADS 240 MG PO CP24
240.0000 mg | ORAL_CAPSULE | Freq: Every day | ORAL | Status: DC
  Administered 2020-11-19 – 2020-11-20 (×2): 240 mg via ORAL
  Filled 2020-11-19 (×2): qty 1

## 2020-11-19 MED ORDER — ADULT MULTIVITAMIN W/MINERALS CH
1.0000 | ORAL_TABLET | Freq: Every day | ORAL | Status: DC
  Administered 2020-11-19 – 2020-11-20 (×2): 1 via ORAL
  Filled 2020-11-19 (×2): qty 1

## 2020-11-19 MED ORDER — SODIUM CHLORIDE 0.9 % IV SOLN
3.0000 g | Freq: Once | INTRAVENOUS | Status: AC
  Administered 2020-11-19: 3 g via INTRAVENOUS
  Filled 2020-11-19: qty 8

## 2020-11-19 MED ORDER — LACTATED RINGERS IV SOLN: INTRAVENOUS | Status: DC

## 2020-11-19 MED ORDER — DILTIAZEM HCL 25 MG/5ML IV SOLN
15.0000 mg | Freq: Once | INTRAVENOUS | Status: AC
  Administered 2020-11-19: 15 mg via INTRAVENOUS
  Filled 2020-11-19: qty 5

## 2020-11-19 MED ORDER — THIAMINE HCL 100 MG/ML IJ SOLN
100.0000 mg | Freq: Every day | INTRAMUSCULAR | Status: DC
  Administered 2020-11-19: 100 mg via INTRAVENOUS
  Filled 2020-11-19: qty 2

## 2020-11-19 MED ORDER — KETOROLAC TROMETHAMINE 30 MG/ML IJ SOLN: 15.0000 mg | Freq: Four times a day (QID) | INTRAMUSCULAR | Status: DC | PRN

## 2020-11-19 MED ORDER — IOHEXOL 300 MG/ML  SOLN
75.0000 mL | Freq: Once | INTRAMUSCULAR | Status: AC | PRN
  Administered 2020-11-19: 75 mL via INTRAVENOUS

## 2020-11-19 MED ORDER — LORAZEPAM 1 MG PO TABS: 1.0000 mg | ORAL_TABLET | ORAL | Status: DC | PRN

## 2020-11-19 MED ORDER — LORAZEPAM 2 MG/ML IJ SOLN: 0.0000 mg | Freq: Two times a day (BID) | INTRAMUSCULAR | Status: DC

## 2020-11-19 MED ORDER — DEXAMETHASONE SODIUM PHOSPHATE 4 MG/ML IJ SOLN
4.0000 mg | Freq: Two times a day (BID) | INTRAMUSCULAR | Status: DC
  Administered 2020-11-19 – 2020-11-20 (×3): 4 mg via INTRAVENOUS
  Filled 2020-11-19 (×3): qty 1

## 2020-11-19 MED ORDER — SODIUM CHLORIDE 0.9 % IV BOLUS
500.0000 mL | Freq: Once | INTRAVENOUS | Status: AC
  Administered 2020-11-19: 500 mL via INTRAVENOUS

## 2020-11-19 MED ORDER — SODIUM CHLORIDE 0.9 % IV BOLUS: 1000.0000 mL | Freq: Once | INTRAVENOUS | Status: DC

## 2020-11-19 MED ORDER — LACTATED RINGERS IV BOLUS: 500.0000 mL | Freq: Once | INTRAVENOUS | Status: DC

## 2020-11-19 MED ORDER — DILTIAZEM HCL-DEXTROSE 125-5 MG/125ML-% IV SOLN (PREMIX)
5.0000 mg/h | INTRAVENOUS | Status: DC
  Administered 2020-11-19: 15 mg/h via INTRAVENOUS
  Administered 2020-11-19: 5 mg/h via INTRAVENOUS
  Administered 2020-11-20: 15 mg/h via INTRAVENOUS
  Filled 2020-11-19 (×3): qty 125

## 2020-11-19 MED ORDER — METOPROLOL SUCCINATE ER 50 MG PO TB24
100.0000 mg | ORAL_TABLET | Freq: Every day | ORAL | Status: DC
  Administered 2020-11-19 – 2020-11-20 (×2): 100 mg via ORAL
  Filled 2020-11-19 (×2): qty 2

## 2020-11-19 MED ORDER — LORAZEPAM 2 MG/ML IJ SOLN: 0.0000 mg | Freq: Four times a day (QID) | INTRAMUSCULAR | Status: DC

## 2020-11-19 MED ORDER — LIDOCAINE VISCOUS HCL 2 % MT SOLN
15.0000 mL | Freq: Once | OROMUCOSAL | Status: AC
  Administered 2020-11-19: 15 mL via OROMUCOSAL
  Filled 2020-11-19: qty 15

## 2020-11-19 MED ORDER — ONDANSETRON HCL 4 MG/2ML IJ SOLN: 4.0000 mg | Freq: Three times a day (TID) | INTRAMUSCULAR | Status: DC | PRN

## 2020-11-19 MED ORDER — THIAMINE HCL 100 MG PO TABS
100.0000 mg | ORAL_TABLET | Freq: Every day | ORAL | Status: DC
  Filled 2020-11-19: qty 1

## 2020-11-19 MED ORDER — IBUPROFEN 400 MG PO TABS: 400.0000 mg | ORAL_TABLET | Freq: Four times a day (QID) | ORAL | Status: DC | PRN

## 2020-11-19 MED ORDER — FOLIC ACID 1 MG PO TABS
1.0000 mg | ORAL_TABLET | Freq: Every day | ORAL | Status: DC
  Administered 2020-11-19: 1 mg via ORAL
  Filled 2020-11-19 (×2): qty 1

## 2020-11-19 MED ORDER — LORAZEPAM 2 MG/ML IJ SOLN
1.0000 mg | INTRAMUSCULAR | Status: DC | PRN
Start: 2020-11-19 — End: 2020-11-20

## 2020-11-19 MED ORDER — HYDRALAZINE HCL 20 MG/ML IJ SOLN: 5.0000 mg | INTRAMUSCULAR | Status: DC | PRN

## 2020-11-19 MED ORDER — LORAZEPAM 2 MG/ML IJ SOLN
0.0000 mg | Freq: Four times a day (QID) | INTRAMUSCULAR | Status: DC
  Filled 2020-11-19: qty 1

## 2020-11-19 NOTE — ED Notes (Signed)
Report received from Jackie, RN.

## 2020-11-19 NOTE — ED Triage Notes (Signed)
Pt arrived via [facility] from home where pt called out for palpitations and upper and lower dental abscess to the left side. Pt reports going to the dentist on Thursday, put on Clindamycin. Pt arrived to ED in AFib RVR at rate of 146bpm. Pt with hx/o AFib. Pt c/o dizziness and HA.

## 2020-11-19 NOTE — ED Notes (Signed)
Pt stated he needed to have a BM, pt educated on bedpan and bedside commode and refuses both of those and requests to use bedside toilet, pt educated on HR and pt states "Ill be okay".  Pt ambulated with a steady gait

## 2020-11-19 NOTE — ED Notes (Signed)
Pt given hospital phone to use. Pt still tachycardiac, titrated diltiziem to 10mg /hr, lopressor given as well, will continue to monitor.   Pt uses bedside urinal with 500 ml documented in I&O's.

## 2020-11-19 NOTE — Consult Note (Signed)
Codie, Krogh [id number] 1956-01-04 Lorretta Harp, MD  Reason for Consult: Periapical tooth abscess  HPI: 65 year old male who has about a 5-day history of left mandibular tooth swelling.  He tells me that the swelling over his left mandible was about as large as a small orange when he went to see the dentist.  He was told that he had a mandibular periapical tooth abscess as well as a maxillary area apical tooth abscess and was started on clindamycin.  He said that he started on the clindamycin and the swelling has significantly decreased but he is continue to have pain.  He also said he developed nausea with some vomiting and diarrhea after starting the clindamycin.  He was also noted to be in atrial fibrillation in the emergency room.  ENT was asked to evaluate for possible abscess drainage.  Allergies: No Known Allergies  ROS: Review of systems normal other than 12 systems except per HPI.  PMH:  Past Medical History:  Diagnosis Date   Claudication (HCC)    a. 06/2015 ABI: R - 0.73, L - 0.73. 30-49% bilat SFA stenosis. 50-74% R Profunda stenosis; b. 01/2017 ABI: R 0.89, L 0.88, TBI R 0.65, L 1.0.   Erectile dysfunction    Essential hypertension    History of echocardiogram    a. 03/29/2014: EF 55-60%, mild LVH, normal RVSP;  b. 05/2015 Echo: EF 60-65%, triv AI, mild MR; c. 07/2020 Echo:    Hyperlipidemia    Non-obstructive CAD    a. 01/27/2014 Cath: LM nl, LAD mild diff dz w/o obs, LCx no sig obs - scattered 20-30% mLCx, RCA no obs dz, EF 70%; b. 06/2015 MV; EF 60%, no ischemia.   PAF (paroxysmal atrial fibrillation) (HCC)    a. Pt says Dx >28yrs ago w/ ? RFCA @ [facility];  b. Recurrent 12/2013;  b. Rx Flecainide and Xarelto-->Intermittent compliance.   PVC's (premature ventricular contractions)    a. 05/2017 Zio: 4000 PVCs in 48 hrs (2%). Brief run of SVT.   Tobacco abuse    a. ongoing - 1 ppd.    FH:  Family History  Problem Relation Age of Onset   Coronary artery disease Father 18    Diabetes Mother    Diabetes Sister    Diabetes Brother    Diabetes Maternal Grandmother    Diabetes Sister    Diabetes Sister     SH:  Social History   Socioeconomic History   Marital status: Single    Spouse name: Not on file   Number of children: Not on file   Years of education: Not on file   Highest education level: Not on file  Occupational History   Not on file  Tobacco Use   Smoking status: Every Day    Packs/day: 0.50    Years: 30.00    Pack years: 15.00    Types: Cigarettes   Smokeless tobacco: Never  Substance and Sexual Activity   Alcohol use: Yes    Alcohol/week: 12.0 standard drinks    Types: 12 Cans of beer per week   Drug use: Yes    Types: Cocaine   Sexual activity: Not on file  Other Topics Concern   Not on file  Social History Narrative   The patient lives in [place] with his sister. He works in a substance abuse program. 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 He is a long-time smoker, one pack per day. No illicit drugs. He  is not married.   Social Determinants of Health   Financial Resource Strain: Not on file  Food Insecurity: Not on file  Transportation Needs: Not on file  Physical Activity: Not on file  Stress: Not on file  Social Connections: Not on file  Intimate Partner Violence: Not on file    PSH:  Past Surgical History:  Procedure Laterality Date   CARDIAC CATHETERIZATION     CARD[facility]AC CATHETERIZATION     [facility]   LEFT HEART CATH Right 01/27/2014   Procedure: LEFT HEART CATH;  Surgeon: Micheline Nohemy Koop, MD;  Location: [facility] CATH LAB;  Service: Cardiovascular;  Laterality: Right;    Physical  Exam: Patient is awake and alert in the ER bed.  He is able to give adequate history.  Physical exam the external nose is clear.  There is no significant facial swelling or edema.  Oral exam shows poor dentition there is tenderness over the left maxillary molars but no abscess palpated.  Over the left mandible  there is obvious pus draining around the molar and premolar region laterally.  Floor mouth is clear.  Anterior neck is unremarkable.   A/P: Periapical tooth abscess.  I have reviewed the CT scan it appears she does have a abscess lateral to the left mandibular molar region.  This appears to have spontaneously drained and is currently draining.  Patient tells me that the pain has significantly subsided since he was started on the Unasyn and Decadron and possibly because the abscess has spontaneously drained.  I had a long discussion with the patient today he is scheduled to see the dentist this coming Thursday to have both the mandibular and the maxillary tooth removed.  Highly recommend that he keep this appointment.  He is currently on Unasyn 3 g every 6 hours and Decadron 4 mg every 12 hours.  I would increase the Decadron to 10 mg every 8 hours.  If he is not able to tolerate the clindamycin I would recommend he be discharged on Augmentin 875 1 p.o. twice daily for 14 days and a 6-day double strength Sterapred taper.  Would highly recommend that he keep his dental appointment on Thursday to have these teeth removed or this infection will certainly return.  I do not think he needs surgical drainage at this point as the abscess is spontaneously draining at this point.  I will sign off if his symptoms worsen would repeat the CT however since he is feeling much better do not think that will be necessary.   Davina Poke 11/19/2020 5:34 PM

## 2020-11-19 NOTE — ED Notes (Signed)
MD aware of HR, new orders, see MAR

## 2020-11-19 NOTE — H&P (Signed)
History and Physical    Revere Maahs EYC:[id number] DOB: [birth year] DOA: 11/19/2020  Referring MD/NP/PA:   PCP: Patient, No Pcp Per (Inactive)   Patient coming from:  The patient is coming from home.  At baseline, pt is independent for most of ADL.        Chief Complaint: palpitation and left dental pain  HPI: Darren Rogers is a 65 y.o. male with medical history significant of polysubstance use (cocaine, alcohol, tobacco), A fib on Eliquis, HTN, HLD, depression, CAD, claudication, BPH, PVC, who presents with palpitation and left dental pain.  Patient states that he has left lower dental infection for more than 8 days.  He was started on clindamycin on 6/16, but no improvement.  He has pain in the left face, which is constant, severe, sharp, nonradiating.  Does not have fever or chills.  He states that he developed palpitation last night because he did not take his Cardizem. Denies chest pain, shortness breath, cough.  He states that he had nausea and vomited once yesterday, today patient does not have nausea, vomiting, diarrhea or abdominal pain.  No symptoms of UTI.  He states that he is taking his Eliquis, last dose was yesterday.  He was found to have atrial fibrillation with RVR, with heart rate up to 140, pt was given 2 dose of IV Cardizem 15 mg x 2 without significant improvement, then Cardizem drip is started.  ED Course: pt was found to have WBC 6.4, lactic acid 2.0, troponin level 9, pending COVID-19 PCR, pending alcohol level, renal function okay, temperature normal, blood pressure 128/99, RR 20, oxygen saturation 98% on room air.  Chest x-ray negative.  CT of head is negative for acute intracranial abnormalities.  CT of her maxillofacial image showed left buccal abscess 21 x 12 mm and facial cellulitis.  Patient is admitted to progressive bed as inpatient.   Review of Systems:   General: no fevers, chills, no body weight gain, has poor appetite, has fatigue HEENT: no blurry  vision, hearing changes or sore throat. Has left dental and facial pain, and facial swelling. Respiratory: no dyspnea, coughing, wheezing CV: no chest pain, has palpitations GI: had nausea, vomiting, no abdominal pain, diarrhea, constipation GU: no dysuria, burning on urination, increased urinary frequency, hematuria  Ext: no leg edema Neuro: no unilateral weakness, numbness, or tingling, no vision change or hearing loss Skin: no rash, no skin tear. MSK: No muscle spasm, no deformity, no limitation of range of movement in spin Heme: No easy bruising.  Travel history: No recent long distant travel.  Allergy: No Known Allergies  Past Medical History:  Diagnosis Date   Claudication ([place])    a. 06/2015 ABI: R - 0.73, L - 0.73. 30-49% bilat SFA stenosis. 50-74% R Profunda stenosis; b. 01/2017 ABI: R 0.89, L 0.88, TBI R 0.65, L 1.0.   Erectile dysfunction    Essential hypertension    History of echocardiogram    a. 03/29/2014: EF 55-60%, mild LVH, normal RVSP;  b. 05/2015 Echo: EF 60-65%, triv AI, mild MR; c. 07/2020 Echo:    Hyperlipidemia    Non-obstructive CAD    a. 01/27/2014 Cath: LM nl, LAD mild diff dz w/o obs, LCx no sig obs - scattered 20-30% mLCx, RCA no obs dz, EF 70%; b. 06/2015 MV; EF 60%, no ischemia.   PAF (paroxysmal atrial fibrillation) ([place])    a. Pt says Dx >75yr ago w/ ? RFCA @ [facility];  b. Recurrent 12/2013;  b. Rx  Flecainide and Xarelto-->Intermittent compliance.   PVC's (premature ventricular contractions)    a. 05/2017 Zio: 4000 PVCs in 48 hrs (2%). Brief run of SVT.   Tobacco abuse    a. ongoing - 1 ppd.    Past Surgical History:  Procedure Laterality Date   CARDIAC CATHETERIZATION     CARDIAC CATHETERIZATION     [facility]   LEFT HEART CATH Right 01/27/2014   Procedure: LEFT HEART CATH;  Surgeon: Blane Ohara, MD;  Location: [facility] CATH LAB;  Service: Cardiovascular;  Laterality: Right;    Social History:  reports that he has been smoking cigarettes. He has a 15.00  pack-year smoking history. He has never used smokeless tobacco. He reports current alcohol use of about 12.0 standard drinks of alcohol per week. He reports current drug use. Drug: Cocaine.  Family History:  Family History  Problem Relation Age of Onset   Coronary artery disease Father 46   Diabetes Mother    Diabetes Sister    Diabetes Brother    Diabetes Maternal Grandmother    Diabetes Sister    Diabetes Sister      Prior to Admission medications   Medication Sig Start Date End Date Taking? Authorizing Provider  apixaban (ELIQUIS) 5 MG TABS tablet Take 1 tablet (5 mg total) by mouth 2 (two) times daily. 09/12/20  Yes Domenic Polite, MD  clindamycin (CLEOCIN) 300 MG capsule Take 1 capsule by mouth 2 (two) times daily. 11/14/20  Yes [provider]  diltiazem (CARDIZEM CD) 240 MG 24 hr capsule Take 1 capsule (240 mg total) by mouth daily. 09/12/20  Yes Domenic Polite, MD  ibuprofen (ADVIL) 800 MG tablet Take 800 mg by mouth every 6 (six) hours as needed. 11/14/20  Yes [provider]  metoprolol succinate (TOPROL-XL) 100 MG 24 hr tablet Take 1 tablet (100 mg total) by mouth daily. Take with or immediately following a meal. 09/12/20  Yes Domenic Polite, MD  folic acid (FOLVITE) 1 MG tablet Take 1 tablet (1 mg total) by mouth daily. Patient not taking: No sig reported 10/14/20   Nita Sells, MD    Physical Exam: Vitals:   11/19/20 0330 11/19/20 0333 11/19/20 0615  BP: (!) 128/99    Pulse: (!) 146  (!) 103  Resp: 20  16  Temp: 97.9 F (36.6 C)    TempSrc: Oral    SpO2: 98%  98%  Weight:  70.3 kg    General: Not in acute distress HEENT: has swelling and tenderness in left face,          Eyes: PERRL, EOMI, no scleral icterus.       ENT: No discharge from the ears and nose       Neck: No JVD, no bruit, no mass felt. Heme: No neck lymph node enlargement. Cardiac: S1/S2, irregularly irregular rhythm, no murmurs, No gallops or rubs. Respiratory: No rales,  wheezing, rhonchi or rubs. GI: Soft, nondistended, nontender, no rebound pain, no organomegaly, BS present. GU: No hematuria Ext: No pitting leg edema bilaterally. 1+DP/PT pulse bilaterally. Musculoskeletal: No joint deformities, No joint redness or warmth, no limitation of ROM in spin. Skin: No rashes.  Neuro: Alert, oriented X3, cranial nerves II-XII grossly intact, moves all extremities normally.  Psych: Patient is not psychotic, no suicidal or hemocidal ideation.  Labs on Admission: I have personally reviewed following labs and imaging studies  CBC: Recent Labs  Lab 11/19/20 0333  WBC 6.4  6.2  NEUTROABS 2.7  HGB 13.8  14.1  HCT 38.0*  38.8*  MCV 91.1  88.8  PLT 235  248   Basic Metabolic Panel: Recent Labs  Lab 11/19/20 0333 11/19/20 0338  NA  --  132*  K  --  3.8  CL  --  100  CO2  --  22  GLUCOSE  --  91  BUN  --  9  CREATININE  --  0.97  CALCIUM  --  8.9  MG 2.2  --   PHOS 4.6  --    GFR: Estimated Creatinine Clearance: 68.5 mL/min (by C-G formula based on SCr of 0.97 mg/dL). Liver Function Tests: Recent Labs  Lab 11/19/20 0338  AST 20  ALT 17  ALKPHOS 74  BILITOT 0.4  PROT 6.7  ALBUMIN 3.5   Recent Labs  Lab 11/19/20 0338  LIPASE 52*   No results for input(s): AMMONIA in the last 168 hours. Coagulation Profile: No results for input(s): INR, PROTIME in the last 168 hours. Cardiac Enzymes: No results for input(s): CKTOTAL, CKMB, CKMBINDEX, TROPONINI in the last 168 hours. BNP (last 3 results) No results for input(s): PROBNP in the last 8760 hours. HbA1C: No results for input(s): HGBA1C in the last 72 hours. CBG: No results for input(s): GLUCAP in the last 168 hours. Lipid Profile: No results for input(s): CHOL, HDL, LDLCALC, TRIG, CHOLHDL, LDLDIRECT in the last 72 hours. Thyroid Function Tests: Recent Labs    11/19/20 0333  TSH 3.259   Anemia Panel: No results for input(s): VITAMINB12, FOLATE, FERRITIN, TIBC, IRON, RETICCTPCT in  the last 72 hours. Urine analysis:    Component Value Date/Time   COLORURINE YELLOW (A) 11/16/2014 1255   APPEARANCEUR Clear 07/30/2016 1600   LABSPEC 1.021 11/16/2014 1255   LABSPEC 1.019 03/28/2014 1430   PHURINE 5.0 11/16/2014 1255   GLUCOSEU Negative 07/30/2016 1600   GLUCOSEU Negative 03/28/2014 1430   HGBUR NEGATIVE 11/16/2014 1255   BILIRUBINUR Negative 07/30/2016 1600   BILIRUBINUR Negative 03/28/2014 1430   KETONESUR NEGATIVE 11/16/2014 1255   PROTEINUR Negative 07/30/2016 1600   PROTEINUR NEGATIVE 11/16/2014 1255   NITRITE Negative 07/30/2016 1600   NITRITE NEGATIVE 11/16/2014 1255   LEUKOCYTESUR Negative 07/30/2016 1600   LEUKOCYTESUR Negative 03/28/2014 1430   Sepsis Labs: $RemoveBefo'[id number]'[id number]$ (procalcitonin:4,lacticidven:4) )No results found for this or any previous visit (from the past 240 hour(s)).   Radiological Exams on Admission: CT Head Wo Contrast  Result Date: 11/19/2020 CLINICAL DATA:  Dizziness, nonspecific.  Dental abscess.  On Eliquis EXAM: CT HEAD WITHOUT CONTRAST TECHNIQUE: Contiguous axial images were obtained from the base of the skull through the vertex without intravenous contrast. COMPARISON:  09/10/2020 FINDINGS: Brain: No evidence of acute infarction, hemorrhage, hydrocephalus, extra-axial collection or mass lesion/mass effect. Generalized atrophy. Vascular: No hyperdense vessel or unexpected calcification. Skull: Normal. Negative for fracture or focal lesion. Sinuses/Orbits: Negative IMPRESSION: No acute or interval finding. Electronically Signed   By: Monte Fantasia M.D.   On: 11/19/2020 05:47   CT Maxillofacial W Contrast  Result Date: 11/19/2020 CLINICAL DATA:  Upper and lower dense fall abscess on the left. Patient recently started antibiotics. EXAM: CT MAXILLOFACIAL WITH CONTRAST TECHNIQUE: Multidetector CT imaging of the maxillofacial structures was performed with intravenous contrast. Multiplanar CT image reconstructions were also generated. CONTRAST:   1mL OMNIPAQUE IOHEXOL 300 MG/ML  SOLN COMPARISON:  None similar FINDINGS: Osseous: Devitalized tooth 18 with periapical erosion and alveolar dehiscence. There is an abscess extending laterally from the tooth which measures 21 x 12 mm. There is medial bony  dehiscence as well but no medial/oral cavity inflammation is seen. Sclerosis around the devitalized tooth root is presumably condensing osteitis. Orbits: Negative. No traumatic or inflammatory finding. Sinuses: Negative Soft tissues: Cellulitis on the left around the abscess. Limited intracranial: Negative IMPRESSION: Odontogenic left buccal abscess measuring 21 x 12 mm with regional cellulitis. The offending tooth is #18. Electronically Signed   By: Monte Fantasia M.D.   On: 11/19/2020 05:41   DG Chest Port 1 View  Result Date: 11/19/2020 CLINICAL DATA:  Atrial fibrillation with RVR EXAM: PORTABLE CHEST 1 VIEW COMPARISON:  10/12/2020 FINDINGS: Normal heart size and mediastinal contours. No acute infiltrate or edema. No effusion or pneumothorax. No acute osseous findings. The apices are excluded from view. Artifact from EKG leads. IMPRESSION: 1. No evidence of active disease. 2. Excluded apices. Electronically Signed   By: Monte Fantasia M.D.   On: 11/19/2020 04:31     EKG: I have personally reviewed.  Atrial fibrillation, QTC 426, heart rate was 35, nonspecific T wave change  Assessment/Plan Principal Problem:   Abscess of buccal space of mouth Active Problems:   Tobacco abuse   Hyperlipidemia   CAD in native artery   Essential hypertension   Alcohol abuse   Cocaine abuse (HCC)   Atrial fibrillation with RVR ([facility])   Polysubstance abuse ([facility])   Facial cellulitis_left   Abscess of buccal space of mouth and facial cellulitis_left: Patient does not have fever or leukocytosis.  Has tachycardia, and lactic acid 2.0, but does not meet criteria for sepsis.  Currently hemodynamically stable.  Patient failed outpatient clindamycin treatment.   Initially I consulted ENT, Dr. Tami Ribas. Per Dr. Tami Ribas, pt will need to have tooth pulled out.  I have contacted dentist Dr. Jackson Latino office in [facility], but unfortunately Dr. Geralynn Ochs is not available today due to family emergency.  Since patient is not septic, I will treat the patient with antibiotics and let patient follow-up with dentist.  Dr. Tami Ribas recommended Unasyn and Decadron or outpatient Augmentin or prednisone.  - will admit to progressive bed as inpatient due to a fib with RVR - Highly appreciated Dr. Ileene Hutchinson recommendations - Unasyn IV - Decadron 4 mg twice daily - PRN Zofran for nausea, - As needed Tylenol, ibuprofen, ketorolac for pain - Blood cultures x 2  - ESR and CRP - IVF:2L of LR  Atrial fibrillation with RVR: HR is up to 146.  No chest pain.  Troponin 9.  Likely triggered by ongoing infection and missing home Cardizem dose.  Patient was given 2 dose of Cardizem 15 mg x 2 without improvement.  Cardizem drip started -Continue Cardizem drip -Continue home Eliquis, Cardizem and metoprolol -Check TSH level  Polysubstance abuse (cocaine,Tobacco and Alcohol) -Nicotine patch -CIWA protocol -Did counseling about importance of quitting substance use -Check UDS  CAD in native artery: No chest pain -Continue metoprolol -Hold Zoloft closely  Essential hypertension -IV hydralazine as needed -On metoprolol, Cardizem     DVT ppx: on Eliquis Code Status: Full code Family Communication: not done, no family member is at bed side.      Disposition Plan:  Anticipate discharge back to previous environment Consults called:  ENT, Dr. Tami Ribas is consulted. Admission status and Level of care: Progressive Cardiac:   as inpt           Status is: Inpatient  Remains inpatient appropriate because:Inpatient level of care appropriate due to severity of illness  Dispo: The patient is from: Home  Anticipated d/c is to: Home              Patient currently is not  medically stable to d/c.   Difficult to place patient No          Date of Service 11/19/2020    [facility]   If 7PM-7AM, please contact night-coverage [email or web address] 11/19/2020, 10:20 AM

## 2020-11-19 NOTE — Consult Note (Signed)
Pharmacy Antibiotic Note  Darren Rogers is a 65 y.o. male admitted on 11/19/2020 with left sided dental abscess. Pharmacy has been consulted for Unasyn dosing.  Plan: Unasyn 3 g IV q6h Monitor clinical picture and renal function F/U C&S, abx deescalation / LOT  Weight: 70.3 kg (155 lb)  Temp (24hrs), Avg:97.9 F (36.6 C), Min:97.9 F (36.6 C), Max:97.9 F (36.6 C)  Recent Labs  Lab 11/19/20 0333 11/19/20 0338  WBC 6.4  6.2  --   CREATININE  --  0.97  LATICACIDVEN 2.0*  --     Estimated Creatinine Clearance: 68.5 mL/min (by C-G formula based on SCr of 0.97 mg/dL).    No Known Allergies  Antimicrobials this admission: 6/21 Unasyn >>    Dose adjustments this admission: N/A  Microbiology results: 6/21 BCx: pending   Thank you for allowing pharmacy to be a part of this patient's care.  Derrek Gu, PharmD 11/19/2020 8:13 AM

## 2020-11-19 NOTE — ED Notes (Signed)
This RN offered to insert another INT for patient to allow for reduction of IV pump beeps and medication administration, patient declined. Patient reports "I don't want to be stuck anymore."

## 2020-11-19 NOTE — ED Provider Notes (Signed)
[facility] Emergency Department Provider Note   ____________________________________________   Event Date/Time   First MD Initiated Contact with Patient 11/19/20 [phone]     (approximate)  I have reviewed the triage vital signs and the nursing notes.   HISTORY  Chief Complaint Abscess and Palpitations    HPI Darren Rogers is a 65 y.o. male brought to the ED via EMS from home with a chief complaint of heart palpitations and dentalgia.  Patient was placed on clindamycin by his dentist 5 days ago for left-sided dental abscesses.  Admits to EtOH tonight.  Missed tonight's dose of Cardizem but reports compliance otherwise.  Arrives to the ED in atrial fibrillation with RVR.  Complaining of left-sided facial pain, headache and dizziness.  Denies fever, cough, chest pain, shortness of breath, abdominal pain, nausea or vomiting.     Past Medical History:  Diagnosis Date   Claudication (HCC)    a. 06/2015 ABI: R - 0.73, L - 0.73. 30-49% bilat SFA stenosis. 50-74% R Profunda stenosis; b. 01/2017 ABI: R 0.89, L 0.88, TBI R 0.65, L 1.0.   Erectile dysfunction    Essential hypertension    History of echocardiogram    a. 03/29/2014: EF 55-60%, mild LVH, normal RVSP;  b. 05/2015 Echo: EF 60-65%, triv AI, mild MR; c. 07/2020 Echo:    Hyperlipidemia    Non-obstructive CAD    a. 01/27/2014 Cath: LM nl, LAD mild diff dz w/o obs, LCx no sig obs - scattered 20-30% mLCx, RCA no obs dz, EF 70%; b. 06/2015 MV; EF 60%, no ischemia.   PAF (paroxysmal atrial fibrillation) (HCC)    a. Pt says Dx >50yrs ago w/ ? RFCA @ [facility];  b. Recurrent 12/2013;  b. Rx Flecainide and Xarelto-->Intermittent compliance.   PVC's (premature ventricular contractions)    a. 05/2017 Zio: 4000 PVCs in 48 hrs (2%). Brief run of SVT.   Tobacco abuse    a. ongoing - 1 ppd.    Patient Active Problem List   Diagnosis Date Noted   Atrial fibrillation with RVR (HCC) 08/06/2020   Demand ischemia (HCC)     Polysubstance abuse (HCC)    Rapid atrial fibrillation (HCC) 08/05/2020   Alcohol use disorder, moderate, dependence (HCC) 08/05/2020   Cocaine abuse (HCC) 08/05/2020   Alcohol abuse 10/20/2019   Depression 10/20/2019   A-fib (HCC) 01/15/2017   Personal history of tobacco use, presenting hazards to health 08/17/2016   BPH associated with nocturia 07/30/2016   IFG (impaired fasting glucose) 07/30/2016   Chest pain 08/22/2015   Essential hypertension    PAD (peripheral artery disease) (HCC)    Drug-induced erectile dysfunction 01/22/2015   CAD in native artery 04/11/2014   Paroxysmal atrial fibrillation (HCC)    Tobacco abuse    Hyperlipidemia    Atrial fibrillation with rapid ventricular response (HCC) 01/27/2014    Past Surgical History:  Procedure Laterality Date   CARDIAC CATHETERIZATION     CARDIAC CATHETERIZATION     [facility]   LEFT HEART CATH Right 01/27/2014   Procedure: LEFT HEART CATH;  Surgeon: Micheline Chapman, MD;  Location: [facility] CATH LAB;  Service: Cardiovascular;  Laterality: Right;    Prior to Admission medications   Medication Sig Start Date End Date Taking? Authorizing Provider  apixaban (ELIQUIS) 5 MG TABS tablet Take 1 tablet (5 mg total) by mouth 2 (two) times daily. 09/12/20  Yes Zannie Cove, MD  clindamycin (CLEOCIN) 300 MG capsule Take 1 capsule by mouth  2 (two) times daily. 11/14/20  Yes [provider]  diltiazem (CARDIZEM CD) 240 MG 24 hr capsule Take 1 capsule (240 mg total) by mouth daily. 09/12/20  Yes Zannie Cove, MD  ibuprofen (ADVIL) 800 MG tablet Take 800 mg by mouth every 6 (six) hours as needed. 11/14/20  Yes [provider]  metoprolol succinate (TOPROL-XL) 100 MG 24 hr tablet Take 1 tablet (100 mg total) by mouth daily. Take with or immediately following a meal. 09/12/20  Yes Zannie Cove, MD  folic acid (FOLVITE) 1 MG tablet Take 1 tablet (1 mg total) by mouth daily. Patient not taking: No sig reported 10/14/20   Rhetta Mura, MD    Allergies Patient has no known allergies.  Family History  Problem Relation Age of Onset   Coronary artery disease Father 10   Diabetes Mother    Diabetes Sister    Diabetes Brother    Diabetes Maternal Grandmother    Diabetes Sister    Diabetes Sister     Social History Social History   Tobacco Use   Smoking status: Every Day    Packs/day: 0.50    Years: 30.00    Pack years: 15.00    Types: Cigarettes   Smokeless tobacco: Never  Substance Use Topics   Alcohol use: Yes    Alcohol/week: 12.0 standard drinks    Types: 12 Cans of beer per week   Drug use: Yes    Types: Cocaine    Review of Systems  Constitutional: No fever/chills Eyes: No visual changes. ENT: Positive for facial/dental pain.  No sore throat. Cardiovascular: Denies chest pain. Respiratory: Denies shortness of breath. Gastrointestinal: No abdominal pain.  No nausea, no vomiting.  No diarrhea.  No constipation. Genitourinary: Negative for dysuria. Musculoskeletal: Negative for back pain. Skin: Negative for rash. Neurological: Positive for headache and dizziness.  Negative for focal weakness or numbness.   ____________________________________________   PHYSICAL EXAM:  VITAL SIGNS: ED Triage Vitals  Enc Vitals Group     BP      Pulse      Resp      Temp      Temp src      SpO2      Weight      Height      Head Circumference      Peak Flow      Pain Score      Pain Loc      Pain Edu?      Excl. in GC?     Constitutional: Alert and oriented. Well appearing and in mild acute distress. Eyes: Conjunctivae are normal. PERRL. EOMI. Head: Atraumatic. Nose: No congestion/rhinnorhea. Mouth/Throat: Mucous membranes are moist.  Mild left jaw/facial swelling. Left upper and lower dental abscesses. Neck: No stridor.   Cardiovascular: Tachycardic rate, irregular rhythm. Grossly normal heart sounds.  Good peripheral circulation. Respiratory: Normal respiratory effort.  No  retractions. Lungs CTAB. Gastrointestinal: Soft and nontender. No distention. No abdominal bruits. No CVA tenderness. Musculoskeletal: No lower extremity tenderness nor edema.  No joint effusions. Neurologic:  Normal speech and language. No gross focal neurologic deficits are appreciated.  Skin:  Skin is warm, dry and intact. No rash noted. Psychiatric: Mood and affect are normal. Speech and behavior are normal.  ____________________________________________   LABS (all labs ordered are listed, but only abnormal results are displayed)  Labs Reviewed  CBC - Abnormal; Notable for the following components:      Result Value   HCT  38.8 (*)    MCHC 36.3 (*)    RDW 16.5 (*)    All other components within normal limits  LACTIC ACID, PLASMA - Abnormal; Notable for the following components:   Lactic Acid, Venous 2.0 (*)    All other components within normal limits  COMPREHENSIVE METABOLIC PANEL - Abnormal; Notable for the following components:   Sodium 132 (*)    All other components within normal limits  LIPASE, BLOOD - Abnormal; Notable for the following components:   Lipase 52 (*)    All other components within normal limits  CULTURE, BLOOD (ROUTINE X 2)  CULTURE, BLOOD (ROUTINE X 2)  RESP PANEL BY RT-PCR (FLU A&B, COVID) ARPGX2  LACTIC ACID, PLASMA  ETHANOL  DIFFERENTIAL  TROPONIN I (HIGH SENSITIVITY)  TROPONIN I (HIGH SENSITIVITY)   ____________________________________________  EKG  ED ECG REPORT I, Dimas Scheck J, the attending physician, personally viewed and interpreted this ECG.   Date: 11/19/2020  EKG Time: 0330  Rate: 135  Rhythm: atrial fibrillation, rate 135  Axis: Normal  Intervals:none  ST&T Change: Nonspecific  ____________________________________________  RADIOLOGY I, Anyiah Coverdale J, personally viewed and evaluated these images (plain radiographs) as part of my medical decision making, as well as reviewing the written report by the radiologist.  ED MD  interpretation: No ICH, left buccal abscess with regional cellulitis, no acute cardiopulmonary process  Official radiology report(s): CT Head Wo Contrast  Result Date: 11/19/2020 CLINICAL DATA:  Dizziness, nonspecific.  Dental abscess.  On Eliquis EXAM: CT HEAD WITHOUT CONTRAST TECHNIQUE: Contiguous axial images were obtained from the base of the skull through the vertex without intravenous contrast. COMPARISON:  09/10/2020 FINDINGS: Brain: No evidence of acute infarction, hemorrhage, hydrocephalus, extra-axial collection or mass lesion/mass effect. Generalized atrophy. Vascular: No hyperdense vessel or unexpected calcification. Skull: Normal. Negative for fracture or focal lesion. Sinuses/Orbits: Negative IMPRESSION: No acute or interval finding. Electronically Signed   By: Marnee Spring M.D.   On: 11/19/2020 05:47   CT Maxillofacial W Contrast  Result Date: 11/19/2020 CLINICAL DATA:  Upper and lower dense fall abscess on the left. Patient recently started antibiotics. EXAM: CT MAXILLOFACIAL WITH CONTRAST TECHNIQUE: Multidetector CT imaging of the maxillofacial structures was performed with intravenous contrast. Multiplanar CT image reconstructions were also generated. CONTRAST:  65mL OMNIPAQUE IOHEXOL 300 MG/ML  SOLN COMPARISON:  None similar FINDINGS: Osseous: Devitalized tooth 18 with periapical erosion and alveolar dehiscence. There is an abscess extending laterally from the tooth which measures 21 x 12 mm. There is medial bony dehiscence as well but no medial/oral cavity inflammation is seen. Sclerosis around the devitalized tooth root is presumably condensing osteitis. Orbits: Negative. No traumatic or inflammatory finding. Sinuses: Negative Soft tissues: Cellulitis on the left around the abscess. Limited intracranial: Negative IMPRESSION: Odontogenic left buccal abscess measuring 21 x 12 mm with regional cellulitis. The offending tooth is #18. Electronically Signed   By: Marnee Spring M.D.   On:  11/19/2020 05:41   DG Chest Port 1 View  Result Date: 11/19/2020 CLINICAL DATA:  Atrial fibrillation with RVR EXAM: PORTABLE CHEST 1 VIEW COMPARISON:  10/12/2020 FINDINGS: Normal heart size and mediastinal contours. No acute infiltrate or edema. No effusion or pneumothorax. No acute osseous findings. The apices are excluded from view. Artifact from EKG leads. IMPRESSION: 1. No evidence of active disease. 2. Excluded apices. Electronically Signed   By: Marnee Spring M.D.   On: 11/19/2020 04:31    ____________________________________________   PROCEDURES  Procedure(s) performed (including Critical Care):  .1-3  Lead EKG Interpretation  Date/Time: 11/19/2020 3:48 AM Performed by: Irean Hong, MD Authorized by: Irean Hong, MD     Interpretation: abnormal     ECG rate:  135   ECG rate assessment: tachycardic     Rhythm: atrial fibrillation     Ectopy: none     Conduction: normal   Comments:     Patient placed on cardiac monitor to evaluate for arrhythmias   CRITICAL CARE Performed by: Irean Hong   Total critical care time: 45 minutes  Critical care time was exclusive of separately billable procedures and treating other patients.  Critical care was necessary to treat or prevent imminent or life-threatening deterioration.  Critical care was time spent personally by me on the following activities: development of treatment plan with patient and/or surrogate as well as nursing, discussions with consultants, evaluation of patient's response to treatment, examination of patient, obtaining history from patient or surrogate, ordering and performing treatments and interventions, ordering and review of laboratory studies, ordering and review of radiographic studies, pulse oximetry and re-evaluation of patient's condition.  ____________________________________________   INITIAL IMPRESSION / ASSESSMENT AND PLAN / ED COURSE  As part of my medical decision making, I reviewed the  following data within the electronic MEDICAL RECORD NUMBER Nursing notes reviewed and incorporated, Labs reviewed, EKG interpreted, Old chart reviewed, Radiograph reviewed, and Notes from prior ED visits     65 year old male presenting with dentalgia from dental abscesses, atrial fibrillation with RVR. Differential diagnosis includes, but is not limited to, ACS, aortic dissection, pulmonary embolism, cardiac tamponade, pneumothorax, pneumonia, pericarditis, myocarditis, GI-related causes including esophagitis/gastritis, and musculoskeletal chest wall pain.     Will obtain septic work-up, chest x-ray, CT head/face.  Administer IV Cardizem for rate control.  Anticipate hospitalization  Clinical Course as of 11/19/20 0639  Tue Nov 19, 2020  5035 Heart rate remains in the 130s after initial IV diltiazem bolus.  Will repeat. [JS]  0439 HR remains afib 129. Will start Cardizem gtt. Will discuss with hospitalist for admission. [JS]  0607 Did note lactic acid of 2.0.  Patient does not meet code sepsis criteria by vital signs.  He has been on clindamycin for several days for dental abscess.  Will treat with IV Unasyn. [JS]    Clinical Course User Index [JS] Irean Hong, MD     ____________________________________________   FINAL CLINICAL IMPRESSION(S) / ED DIAGNOSES  Final diagnoses:  Atrial fibrillation with RVR (HCC)  Abscess of buccal cavity  Cellulitis of face     ED Discharge Orders     None        Note:  This document was prepared using Dragon voice recognition software and may include unintentional dictation errors.    Irean Hong, MD 11/19/20 [phone]

## 2020-11-20 DIAGNOSIS — I4891 Unspecified atrial fibrillation: Secondary | ICD-10-CM | POA: Diagnosis present

## 2020-11-20 DIAGNOSIS — K122 Cellulitis and abscess of mouth: Secondary | ICD-10-CM | POA: Diagnosis not present

## 2020-11-20 DIAGNOSIS — K0889 Other specified disorders of teeth and supporting structures: Secondary | ICD-10-CM | POA: Diagnosis not present

## 2020-11-20 LAB — CBC
HCT: 35.7 % — ABNORMAL LOW (ref 39.0–52.0)
Hemoglobin: 12.7 g/dL — ABNORMAL LOW (ref 13.0–17.0)
MCH: 32.1 pg (ref 26.0–34.0)
MCHC: 35.6 g/dL (ref 30.0–36.0)
MCV: 90.2 fL (ref 80.0–100.0)
Platelets: 224 10*3/uL (ref 150–400)
RBC: 3.96 MIL/uL — ABNORMAL LOW (ref 4.22–5.81)
RDW: 16.9 % — ABNORMAL HIGH (ref 11.5–15.5)
WBC: 6.3 10*3/uL (ref 4.0–10.5)
nRBC: 0 % (ref 0.0–0.2)

## 2020-11-20 MED ORDER — AMOXICILLIN-POT CLAVULANATE 875-125 MG PO TABS: 1.0000 | ORAL_TABLET | Freq: Two times a day (BID) | ORAL | 0 refills | Status: AC

## 2020-11-20 NOTE — ED Notes (Signed)
IV removed. Pt waiting on ride and paperwork.

## 2020-11-20 NOTE — Discharge Summary (Signed)
[facility] Discharge Summary   Patient: Darren Rogers WUG:[id number]  PCP: Patient, No Pcp Per (Inactive)  Date of admission: 11/19/2020   Date of discharge:  11/20/2020     Discharge Diagnoses:  Principal Problem:   Abscess of buccal space of mouth Active Problems:   Tobacco abuse   Hyperlipidemia   CAD in native artery   Essential hypertension   Alcohol abuse   Cocaine abuse (HCC)   Atrial fibrillation with RVR (HCC)   Polysubstance abuse (HCC)   Facial cellulitis_left   Atrial fibrillation (HCC)   Admitted From: Home Disposition:  Home   Recommendations for Outpatient Follow-up:  PCP: in 1 wk Dentist scheduled appointment tomorrow a.m. Follow up LABS/TEST:     Diet recommendation: Cardiac diet  Activity: The patient is advised to gradually reintroduce usual activities, as tolerated  Discharge Condition: stable  Code Status: Full code   History of present illness: As per the H and P dictated on admission Hospital Course:  Darren Rogers is a 65 y.o. male with medical history significant of polysubstance use (cocaine, alcohol, tobacco), A fib on Eliquis, HTN, HLD, depression, CAD, claudication, BPH, PVC, who presents with palpitation and left dental pain. Patient states that he has left lower dental infection for more than 8 days.  He was started on clindamycin on 6/16, but no improvement.  He has pain in the left face, which is constant, severe, sharp, nonradiating.  Does not have fever or chills.  He states that he developed palpitation last night because he did not take his Cardizem. Denies chest pain, shortness breath, cough.  He states that he had nausea and vomited once yesterday, today patient does not have nausea, vomiting, diarrhea or abdominal pain.  No symptoms of UTI.  He states that he is taking his Eliquis, last dose was yesterday. He was found to have atrial fibrillation with RVR, with heart rate up to 140, pt was given 2 dose of IV Cardizem 15 mg x 2  without significant improvement, then Cardizem drip is started. ED Course: pt was found to have WBC 6.4, lactic acid 2.0, troponin level 9, pending COVID-19 PCR, pending alcohol level, renal function okay, temperature normal, blood pressure 128/99, RR 20, oxygen saturation 98% on room air.  Chest x-ray negative.  CT of head is negative for acute intracranial abnormalities.  CT of her maxillofacial image showed left buccal abscess 21 x 12 mm and facial cellulitis.  Patient is admitted to progressive bed as inpatient. Principal Problem:   Abscess of buccal space of mouth Active Problems:   Tobacco abuse   Hyperlipidemia   CAD in native artery   Essential hypertension   Alcohol abuse   Cocaine abuse (HCC)   Atrial fibrillation with RVR (HCC)   Polysubstance abuse (HCC)   Facial cellulitis_left   Abscess of buccal space of mouth and facial cellulitis_left: Patient does not have fever or leukocytosis.  Has tachycardia, and lactic acid 2.0, but does not meet criteria for sepsis.  Currently hemodynamically stable.  Patient failed outpatient clindamycin treatment.  Initially I consulted ENT, Dr. Jenne Campus. Per Dr. Jenne Campus, pt will need to have tooth pulled out.  I have contacted dentist Dr. Lacretia Leigh office in [place] [place], but unfortunately Dr. Lucky Cowboy is not available today due to family emergency.  Since patient is not septic, treated with antibiotics and let patient follow-up with dentist.  Dr. Jenne Campus recommended Unasyn and Decadron or outpatient Augmentin or prednisone. Patient was admitted, general surgery was also consulted, patient's  abscess spontaneously started draining, did not need any surgical intervention, patient was feeling much improvement in the pain after him abscess drainage.  Patient was requesting to be discharged home so that he can keep his appointment with the dentist tomorrow a.m.  So patient was discharged on oral Augmentin for 7 days further management as per dentist and PCP. trial  fibrillation with RVR: HR is up to 146.  No chest pain.  Troponin 9.  Likely triggered by ongoing infection and missing home Cardizem dose.  Patient was given 2 dose of Cardizem 15 mg x 2 without improvement. s/p Cardizem drip weaned off.  Home medications were continued Eliquis, Cardizem and metoprolol.  Patient's heart rate is under control now.  Patient was advised to stay overnight for cardiac monitor to make sure his heart rate remains under control on oral medications but patient wanted to leave to keep his appointment with dentist tomorrow a.m.  He verbalized understanding the risk and benefit of discharge.  Patient stated that if anything will happen then he will come right back. Polysubstance abuse (cocaine,Tobacco and Alcohol), s/p Nicotine patch, CIWA protocol -Did counseling about importance of quitting substance use. UDS positive for opiates CAD in native artery: No chest pain, Continue metoprolol Essential hypertension, stable on home meds  metoprolol, Cardizem Body mass index is 25.02 kg/m.   Nutrition Interventions:       Patient was instructed, not to drive, operate heavy machinery, perform activities at heights, swimming or participation in water activities or provide baby sitting services while on Pain, Sleep and Anxiety Medications; until his outpatient Physician has advised to do so again.  - Also recommended to not to take more than prescribed Pain, Sleep and Anxiety Medications. Patient was ambulatory without any assistance. On the day of the discharge the patient's vitals were stable, and no other acute medical condition were reported by patient. the patient was felt safe to be discharge at Home.  Consultants: General surgery Procedures: Na  Discharge Exam: General: Appear in no distress, no Rash; Oral Mucosa Clear, moist. Cardiovascular: S1 and S2 Present, no Murmur, Respiratory: normal respiratory effort, Bilateral Air entry present and no Crackles, no  wheezes Abdomen: Bowel Sound present, Soft and no tenderness, no hernia Extremities: no Pedal edema, no calf tenderness Neurology: alert and oriented to time, place, and person affect appropriate.  Filed Weights   11/19/20 0333  Weight: 70.3 kg   Vitals:   11/20/20 0452 11/20/20 0938  BP: 122/84 133/71  Pulse: 87 86  Resp: 15 20  Temp: 98.1 F (36.7 C) 97.8 F (36.6 C)  SpO2: 98% 98%    DISCHARGE MEDICATION: Allergies as of 11/20/2020   No Known Allergies      Medication List     STOP taking these medications    clindamycin 300 MG capsule Commonly known as: CLEOCIN   folic acid 1 MG tablet Commonly known as: FOLVITE       TAKE these medications    amoxicillin-clavulanate 875-125 MG tablet Commonly known as: Augmentin Take 1 tablet by mouth 2 (two) times daily for 7 days.   apixaban 5 MG Tabs tablet Commonly known as: ELIQUIS Take 1 tablet (5 mg total) by mouth 2 (two) times daily.   diltiazem 240 MG 24 hr capsule Commonly known as: CARDIZEM CD Take 1 capsule (240 mg total) by mouth daily.   ibuprofen 800 MG tablet Commonly known as: ADVIL Take 800 mg by mouth every 6 (six) hours as needed.   metoprolol  succinate 100 MG 24 hr tablet Commonly known as: TOPROL-XL Take 1 tablet (100 mg total) by mouth daily. Take with or immediately following a meal.       No Known Allergies Discharge Instructions     Call MD for:   Complete by: As directed    Palpitations   Diet - low sodium heart healthy   Complete by: As directed    Discharge instructions   Complete by: As directed    F/u PCP in 1 wk F/u Dentist as per schedule F/u Cardio in 1 wk   Increase activity slowly   Complete by: As directed        The results of significant diagnostics from this hospitalization (including imaging, microbiology, ancillary and laboratory) are listed below for reference.    Significant Diagnostic Studies: CT Head Wo Contrast  Result Date:  11/19/2020 CLINICAL DATA:  Dizziness, nonspecific.  Dental abscess.  On Eliquis EXAM: CT HEAD WITHOUT CONTRAST TECHNIQUE: Contiguous axial images were obtained from the base of the skull through the vertex without intravenous contrast. COMPARISON:  09/10/2020 FINDINGS: Brain: No evidence of acute infarction, hemorrhage, hydrocephalus, extra-axial collection or mass lesion/mass effect. Generalized atrophy. Vascular: No hyperdense vessel or unexpected calcification. Skull: Normal. Negative for fracture or focal lesion. Sinuses/Orbits: Negative IMPRESSION: No acute or interval finding. Electronically Signed   By: Marnee Spring M.D.   On: 11/19/2020 05:47   CT Maxillofacial W Contrast  Result Date: 11/19/2020 CLINICAL DATA:  Upper and lower dense fall abscess on the left. Patient recently started antibiotics. EXAM: CT MAXILLOFACIAL WITH CONTRAST TECHNIQUE: Multidetector CT imaging of the maxillofacial structures was performed with intravenous contrast. Multiplanar CT image reconstructions were also generated. CONTRAST:  67mL OMNIPAQUE IOHEXOL 300 MG/ML  SOLN COMPARISON:  None similar FINDINGS: Osseous: Devitalized tooth 18 with periapical erosion and alveolar dehiscence. There is an abscess extending laterally from the tooth which measures 21 x 12 mm. There is medial bony dehiscence as well but no medial/oral cavity inflammation is seen. Sclerosis around the devitalized tooth root is presumably condensing osteitis. Orbits: Negative. No traumatic or inflammatory finding. Sinuses: Negative Soft tissues: Cellulitis on the left around the abscess. Limited intracranial: Negative IMPRESSION: Odontogenic left buccal abscess measuring 21 x 12 mm with regional cellulitis. The offending tooth is #18. Electronically Signed   By: Marnee Spring M.D.   On: 11/19/2020 05:41   DG Chest Port 1 View  Result Date: 11/19/2020 CLINICAL DATA:  Atrial fibrillation with RVR EXAM: PORTABLE CHEST 1 VIEW COMPARISON:  10/12/2020  FINDINGS: Normal heart size and mediastinal contours. No acute infiltrate or edema. No effusion or pneumothorax. No acute osseous findings. The apices are excluded from view. Artifact from EKG leads. IMPRESSION: 1. No evidence of active disease. 2. Excluded apices. Electronically Signed   By: Marnee Spring M.D.   On: 11/19/2020 04:31    Microbiology: Recent Results (from the past 240 hour(s))  Resp Panel by RT-PCR (Flu A&B, Covid) Nasopharyngeal Swab     Status: None   Collection Time: 11/19/20  7:02 AM   Specimen: Nasopharyngeal Swab; Nasopharyngeal(NP) swabs in vial transport medium  Result Value Ref Range Status   SARS Coronavirus 2 by RT PCR NEGATIVE NEGATIVE Final    Comment: (NOTE) SARS-CoV-2 target nucleic acids are NOT DETECTED.  The SARS-CoV-2 RNA is generally detectable in upper respiratory specimens during the acute phase of infection. The lowest concentration of SARS-CoV-2 viral copies this assay can detect is 138 copies/mL. A negative result does not preclude  SARS-Cov-2 infection and should not be used as the sole basis for treatment or other patient management decisions. A negative result may occur with  improper specimen collection/handling, submission of specimen other than nasopharyngeal swab, presence of viral mutation(s) within the areas targeted by this assay, and inadequate number of viral copies(<138 copies/mL). A negative result must be combined with clinical observations, patient history, and epidemiological information. The expected result is Negative.  Fact Sheet for Patients:  BloggerCourse.comhttps://www.fda.gov/media/152166/download  Fact Sheet for Healthcare Providers:  SeriousBroker.ithttps://www.fda.gov/media/152162/download  This test is no t yet approved or cleared by the [place] FDA and  has been authorized for detection and/or diagnosis of SARS-CoV-2 by FDA under an Emergency Use Authorization (EUA). This EUA will remain  in effect (meaning this test can be used) for the  duration of the COVID-19 declaration under Section 564(b)(1) of the Act, 21 U.S.C.section 360bbb-3(b)(1), unless the authorization is terminated  or revoked sooner.       Influenza A by PCR NEGATIVE NEGATIVE Final   Influenza B by PCR NEGATIVE NEGATIVE Final    Comment: (NOTE) The Xpert Xpress SARS-CoV-2/FLU/RSV plus assay is intended as an aid in the diagnosis of influenza from Nasopharyngeal swab specimens and should not be used as a sole basis for treatment. Nasal washings and aspirates are unacceptable for Xpert Xpress SARS-CoV-2/FLU/RSV testing.  Fact Sheet for Patients: BloggerCourse.comhttps://www.fda.gov/media/152166/download  Fact Sheet for Healthcare Providers: SeriousBroker.ithttps://www.fda.gov/media/152162/download  This test is not yet approved or cleared by the [place] FDA and has been authorized for detection and/or diagnosis of SARS-CoV-2 by FDA under an Emergency Use Authorization (EUA). This EUA will remain in effect (meaning this test can be used) for the duration of the COVID-19 declaration under Section 564(b)(1) of the Act, 21 U.S.C. section 360bbb-3(b)(1), unless the authorization is terminated or revoked.  Performed at [facility], [address] [place], [place] [address]   Culture, blood (routine x 2)     Status: None (Preliminary result)   Collection Time: 11/19/20  9:55 AM   Specimen: BLOOD  Result Value Ref Range Status   Specimen Description BLOOD RIGHT ANTECUBITAL  Final   Special Requests   Final    BOTTLES DRAWN AEROBIC AND ANAEROBIC Blood Culture results may not be optimal due to an excessive volume of blood received in culture bottles   Culture   Final    NO GROWTH < 24 HOURS Performed at [facility], [address] [place], [place] [address]    Report Status PENDING  Incomplete  Culture, blood (routine x 2)     Status: None (Preliminary result)   Collection Time: 11/19/20  9:55 AM   Specimen: BLOOD  Result Value Ref Range Status    Specimen Description BLOOD BLOOD RIGHT WRIST  Final   Special Requests   Final    BOTTLES DRAWN AEROBIC AND ANAEROBIC Blood Culture results may not be optimal due to an excessive volume of blood received in culture bottles   Culture   Final    NO GROWTH < 24 HOURS Performed at [facility], [address] [place], [place] [address]    Report Status PENDING  Incomplete     Labs: CBC: Recent Labs  Lab 11/19/20 0333 11/20/20 0447  WBC 6.4  6.2 6.3  NEUTROABS 2.7  --   HGB 13.8  14.1 12.7*  HCT 38.0*  38.8* 35.7*  MCV 91.1  88.8 90.2  PLT 235  238 224   Basic Metabolic Panel: Recent Labs  Lab 11/19/20 0333 11/19/20 0338  NA  --  132*  K  --  3.8  CL  --  100  CO2  --  22  GLUCOSE  --  91  BUN  --  9  CREATININE  --  0.97  CALCIUM  --  8.9  MG 2.2  --   PHOS 4.6  --    Liver Function Tests: Recent Labs  Lab 11/19/20 0338  AST 20  ALT 17  ALKPHOS 74  BILITOT 0.4  PROT 6.7  ALBUMIN 3.5   Recent Labs  Lab 11/19/20 0338  LIPASE 52*   No results for input(s): AMMONIA in the last 168 hours. Cardiac Enzymes: No results for input(s): CKTOTAL, CKMB, CKMBINDEX, TROPONINI in the last 168 hours. BNP (last 3 results) Recent Labs    08/05/20 0420 08/12/20 0740  BNP 93.2 185.1*   CBG: No results for input(s): GLUCAP in the last 168 hours.  Time spent: 35 minutes  Signed:  Gillis Santa  [facility]  11/20/2020 11:00 AM

## 2020-11-20 NOTE — ED Notes (Signed)
Per admitting MD, okay to stop cardizem gtt at this time.

## 2020-11-20 NOTE — ED Notes (Signed)
This RN contacted patient by phone to confirm discharge instructions and teaching. Pt confirms need to pick up antibiotics and other discharge teaching. No other questions at this time.

## 2020-11-20 NOTE — ED Notes (Signed)
Pt asked about going home. Informed that pt will need to switch to PO meds first to control rhythm. Pt wanting to leave this morning sometime. NAD. Polite, AOx4. Denies further needs.

## 2020-11-24 LAB — CULTURE, BLOOD (ROUTINE X 2)
Culture: NO GROWTH
Culture: NO GROWTH

## 2020-12-07 ENCOUNTER — Emergency Department: Payer: Medicare Other

## 2020-12-07 ENCOUNTER — Other Ambulatory Visit: Payer: Self-pay

## 2020-12-07 ENCOUNTER — Observation Stay
Admission: EM | Admit: 2020-12-07 | Discharge: 2020-12-08 | Disposition: A | Discharge status: Home / Self Care | Payer: Medicare Other | Attending: Hospitalist | Admitting: Hospitalist

## 2020-12-07 DIAGNOSIS — I2510 Atherosclerotic heart disease of native coronary artery without angina pectoris: Secondary | ICD-10-CM | POA: Diagnosis not present

## 2020-12-07 DIAGNOSIS — Z9119 Patient's noncompliance with other medical treatment and regimen: Secondary | ICD-10-CM | POA: Insufficient documentation

## 2020-12-07 DIAGNOSIS — I480 Paroxysmal atrial fibrillation: Principal | ICD-10-CM | POA: Insufficient documentation

## 2020-12-07 DIAGNOSIS — Z7901 Long term (current) use of anticoagulants: Secondary | ICD-10-CM | POA: Insufficient documentation

## 2020-12-07 DIAGNOSIS — Z20822 Contact with and (suspected) exposure to covid-19: Secondary | ICD-10-CM | POA: Diagnosis not present

## 2020-12-07 DIAGNOSIS — K860 Alcohol-induced chronic pancreatitis: Secondary | ICD-10-CM | POA: Insufficient documentation

## 2020-12-07 DIAGNOSIS — R0602 Shortness of breath: Secondary | ICD-10-CM | POA: Insufficient documentation

## 2020-12-07 DIAGNOSIS — I4891 Unspecified atrial fibrillation: Secondary | ICD-10-CM | POA: Diagnosis present

## 2020-12-07 DIAGNOSIS — Z91199 Patient's noncompliance with other medical treatment and regimen due to unspecified reason: Secondary | ICD-10-CM

## 2020-12-07 DIAGNOSIS — F1020 Alcohol dependence, uncomplicated: Secondary | ICD-10-CM | POA: Diagnosis present

## 2020-12-07 DIAGNOSIS — F17210 Nicotine dependence, cigarettes, uncomplicated: Secondary | ICD-10-CM | POA: Insufficient documentation

## 2020-12-07 DIAGNOSIS — K852 Alcohol induced acute pancreatitis without necrosis or infection: Secondary | ICD-10-CM | POA: Diagnosis present

## 2020-12-07 DIAGNOSIS — R079 Chest pain, unspecified: Secondary | ICD-10-CM | POA: Diagnosis present

## 2020-12-07 DIAGNOSIS — I10 Essential (primary) hypertension: Secondary | ICD-10-CM | POA: Insufficient documentation

## 2020-12-07 DIAGNOSIS — F1010 Alcohol abuse, uncomplicated: Secondary | ICD-10-CM

## 2020-12-07 DIAGNOSIS — Y906 Blood alcohol level of 120-199 mg/100 ml: Secondary | ICD-10-CM | POA: Diagnosis not present

## 2020-12-07 DIAGNOSIS — Z9114 Patient's other noncompliance with medication regimen: Secondary | ICD-10-CM

## 2020-12-07 DIAGNOSIS — R0789 Other chest pain: Secondary | ICD-10-CM | POA: Diagnosis present

## 2020-12-07 DIAGNOSIS — I482 Chronic atrial fibrillation, unspecified: Secondary | ICD-10-CM

## 2020-12-07 DIAGNOSIS — Z720 Tobacco use: Secondary | ICD-10-CM | POA: Diagnosis present

## 2020-12-07 DIAGNOSIS — Z79899 Other long term (current) drug therapy: Secondary | ICD-10-CM | POA: Diagnosis not present

## 2020-12-07 LAB — TSH
TSH: 1.588 u[IU]/mL (ref 0.350–4.500)
TSH: 0.757 u[IU]/mL (ref 0.350–4.500)

## 2020-12-07 LAB — CBC WITH DIFFERENTIAL/PLATELET
Abs Immature Granulocytes: 0.01 10*3/uL (ref 0.00–0.07)
Basophils Absolute: 0.1 10*3/uL (ref 0.0–0.1)
Basophils Relative: 1 %
Eosinophils Absolute: 0.5 10*3/uL (ref 0.0–0.5)
Eosinophils Relative: 8 %
HCT: 38.9 % — ABNORMAL LOW (ref 39.0–52.0)
Hemoglobin: 14 g/dL (ref 13.0–17.0)
Immature Granulocytes: 0 %
Lymphocytes Relative: 52 %
Lymphs Abs: 3.3 10*3/uL (ref 0.7–4.0)
MCH: 33 pg (ref 26.0–34.0)
MCHC: 36 g/dL (ref 30.0–36.0)
MCV: 91.7 fL (ref 80.0–100.0)
Monocytes Absolute: 0.4 10*3/uL (ref 0.1–1.0)
Monocytes Relative: 7 %
Neutro Abs: 2.1 10*3/uL (ref 1.7–7.7)
Neutrophils Relative %: 32 %
Platelets: 220 10*3/uL (ref 150–400)
RBC: 4.24 MIL/uL (ref 4.22–5.81)
RDW: 17.7 % — ABNORMAL HIGH (ref 11.5–15.5)
WBC: 6.4 10*3/uL (ref 4.0–10.5)
nRBC: 0 % (ref 0.0–0.2)

## 2020-12-07 LAB — URINE DRUG SCREEN, QUALITATIVE (ARMC ONLY)
Amphetamines, Ur Screen: NOT DETECTED
Barbiturates, Ur Screen: NOT DETECTED
Benzodiazepine, Ur Scrn: NOT DETECTED
Cannabinoid 50 Ng, Ur ~~LOC~~: NOT DETECTED
Cocaine Metabolite,Ur ~~LOC~~: NOT DETECTED
MDMA (Ecstasy)Ur Screen: NOT DETECTED
Methadone Scn, Ur: NOT DETECTED
Opiate, Ur Screen: NOT DETECTED
Phencyclidine (PCP) Ur S: NOT DETECTED
Tricyclic, Ur Screen: NOT DETECTED

## 2020-12-07 LAB — COMPREHENSIVE METABOLIC PANEL
ALT: 20 U/L (ref 0–44)
AST: 19 U/L (ref 15–41)
Albumin: 3.7 g/dL (ref 3.5–5.0)
Alkaline Phosphatase: 72 U/L (ref 38–126)
Anion gap: 9 (ref 5–15)
BUN: 10 mg/dL (ref 8–23)
CO2: 23 mmol/L (ref 22–32)
Calcium: 9.2 mg/dL (ref 8.9–10.3)
Chloride: 105 mmol/L (ref 98–111)
Creatinine, Ser: 1.01 mg/dL (ref 0.61–1.24)
GFR, Estimated: >60 mL/min (ref >60)
Glucose, Bld: 80 mg/dL (ref 70–99)
Potassium: 3.6 mmol/L (ref 3.5–5.1)
Sodium: 137 mmol/L (ref 135–145)
Total Bilirubin: 0.4 mg/dL (ref 0.3–1.2)
Total Protein: 6.8 g/dL (ref 6.5–8.1)

## 2020-12-07 LAB — URINALYSIS, COMPLETE (UACMP) WITH MICROSCOPIC
Bilirubin Urine: NEGATIVE
Glucose, UA: NEGATIVE mg/dL
Hgb urine dipstick: NEGATIVE
Ketones, ur: NEGATIVE mg/dL
Leukocytes,Ua: NEGATIVE
Nitrite: NEGATIVE
Protein, ur: NEGATIVE mg/dL
Specific Gravity, Urine: 1.003 — ABNORMAL LOW (ref 1.005–1.030)
Squamous Epithelial / HPF: NONE SEEN (ref 0–5)
pH: 5 (ref 5.0–8.0)

## 2020-12-07 LAB — TROPONIN I (HIGH SENSITIVITY)
Troponin I (High Sensitivity): 9 ng/L (ref <18)
Troponin I (High Sensitivity): 8 ng/L (ref <18)

## 2020-12-07 LAB — SARS CORONAVIRUS 2 (TAT 6-24 HRS): SARS Coronavirus 2: NEGATIVE

## 2020-12-07 LAB — MAGNESIUM: Magnesium: 2 mg/dL (ref 1.7–2.4)

## 2020-12-07 LAB — BRAIN NATRIURETIC PEPTIDE: B Natriuretic Peptide: 230.1 pg/mL — ABNORMAL HIGH (ref 0.0–100.0)

## 2020-12-07 LAB — ETHANOL: Alcohol, Ethyl (B): 172 mg/dL — ABNORMAL HIGH (ref <10)

## 2020-12-07 LAB — GLUCOSE, CAPILLARY: Glucose-Capillary: 84 mg/dL (ref 70–99)

## 2020-12-07 LAB — LIPASE, BLOOD: Lipase: 73 U/L — ABNORMAL HIGH (ref 11–51)

## 2020-12-07 MED ORDER — DILTIAZEM HCL 25 MG/5ML IV SOLN
INTRAVENOUS | Status: AC
  Administered 2020-12-07: 10 mg
  Filled 2020-12-07: qty 10

## 2020-12-07 MED ORDER — LORAZEPAM 2 MG PO TABS: 0.0000 mg | ORAL_TABLET | Freq: Four times a day (QID) | ORAL | Status: DC

## 2020-12-07 MED ORDER — DILTIAZEM HCL-DEXTROSE 125-5 MG/125ML-% IV SOLN (PREMIX)
5.0000 mg/h | INTRAVENOUS | Status: DC
  Administered 2020-12-07: 5 mg/h via INTRAVENOUS
  Filled 2020-12-07: qty 125

## 2020-12-07 MED ORDER — LORAZEPAM 2 MG/ML IJ SOLN: 0.0000 mg | Freq: Two times a day (BID) | INTRAMUSCULAR | Status: DC

## 2020-12-07 MED ORDER — DILTIAZEM HCL-DEXTROSE 125-5 MG/125ML-% IV SOLN (PREMIX)
5.0000 mg/h | INTRAVENOUS | Status: DC
  Administered 2020-12-07: 5 mg/h via INTRAVENOUS

## 2020-12-07 MED ORDER — THIAMINE HCL 100 MG PO TABS
100.0000 mg | ORAL_TABLET | Freq: Every day | ORAL | Status: DC
  Administered 2020-12-07 – 2020-12-08 (×2): 100 mg via ORAL
  Filled 2020-12-07 (×2): qty 1

## 2020-12-07 MED ORDER — ONDANSETRON HCL 4 MG/2ML IJ SOLN: 4.0000 mg | Freq: Four times a day (QID) | INTRAMUSCULAR | Status: DC | PRN

## 2020-12-07 MED ORDER — APIXABAN 5 MG PO TABS: 5.0000 mg | ORAL_TABLET | Freq: Two times a day (BID) | ORAL | Status: DC

## 2020-12-07 MED ORDER — DILTIAZEM LOAD VIA INFUSION: 10.0000 mg | Freq: Once | INTRAVENOUS | Status: DC

## 2020-12-07 MED ORDER — METOPROLOL SUCCINATE ER 100 MG PO TB24
100.0000 mg | ORAL_TABLET | Freq: Every day | ORAL | Status: DC
  Administered 2020-12-07 – 2020-12-08 (×2): 100 mg via ORAL
  Filled 2020-12-07: qty 2
  Filled 2020-12-07: qty 1

## 2020-12-07 MED ORDER — DILTIAZEM HCL ER COATED BEADS 120 MG PO CP24
240.0000 mg | ORAL_CAPSULE | Freq: Every day | ORAL | Status: DC
  Administered 2020-12-07 – 2020-12-08 (×2): 240 mg via ORAL
  Filled 2020-12-07: qty 1
  Filled 2020-12-07: qty 2

## 2020-12-07 MED ORDER — ZOLPIDEM TARTRATE 5 MG PO TABS
5.0000 mg | ORAL_TABLET | Freq: Every evening | ORAL | Status: DC | PRN
  Administered 2020-12-07: 5 mg via ORAL
  Filled 2020-12-07: qty 1

## 2020-12-07 MED ORDER — LORAZEPAM 2 MG PO TABS: 0.0000 mg | ORAL_TABLET | Freq: Two times a day (BID) | ORAL | Status: DC

## 2020-12-07 MED ORDER — ACETAMINOPHEN 325 MG PO TABS: 650.0000 mg | ORAL_TABLET | ORAL | Status: DC | PRN

## 2020-12-07 MED ORDER — DILTIAZEM LOAD VIA INFUSION
10.0000 mg | Freq: Once | INTRAVENOUS | Status: DC
  Filled 2020-12-07: qty 10

## 2020-12-07 MED ORDER — SODIUM CHLORIDE 0.9 % IV BOLUS (SEPSIS)
500.0000 mL | Freq: Once | INTRAVENOUS | Status: AC
  Administered 2020-12-07: 500 mL via INTRAVENOUS

## 2020-12-07 MED ORDER — THIAMINE HCL 100 MG/ML IJ SOLN: 100.0000 mg | Freq: Every day | INTRAMUSCULAR | Status: DC

## 2020-12-07 MED ORDER — PANTOPRAZOLE SODIUM 40 MG IV SOLR
40.0000 mg | INTRAVENOUS | Status: DC
  Administered 2020-12-07 – 2020-12-08 (×2): 40 mg via INTRAVENOUS
  Filled 2020-12-07 (×2): qty 40

## 2020-12-07 MED ORDER — LORAZEPAM 2 MG/ML IJ SOLN: 0.0000 mg | Freq: Four times a day (QID) | INTRAMUSCULAR | Status: DC

## 2020-12-07 MED ORDER — APIXABAN 5 MG PO TABS
5.0000 mg | ORAL_TABLET | Freq: Two times a day (BID) | ORAL | Status: DC
  Administered 2020-12-07 – 2020-12-08 (×3): 5 mg via ORAL
  Filled 2020-12-07 (×3): qty 1

## 2020-12-07 NOTE — H&P (Addendum)
History and Physical    [place] Square AYT:[id number] DOB: May 27, 1956 DOA: 12/07/2020  PCP: Patient, No Pcp Per (Inactive)   Patient coming from: Home  I have personally briefly reviewed patient's old medical records in [facility]  Chief Complaint: Chest pain  HPI: Darren Rogers is a 65 y.o. male with medical history significant for polysubstance use ( alcohol, tobacco), A fib on Eliquis, HTN, HLD, depression, CAD, claudication, BPH, medication noncompliance who presents to the ER via EMS for evaluation of chest pain that woke him up from sleep in the early hours of the morning of the day of his admission. Patient states that he woke up around 4 AM with chest tightness mostly over the left anterior chest wall with radiation to his jaw associated with dizziness, shortness of breath and palpitations. He admits to being noncompliant with his medications for rate control and recently ran out of his Cardizem but has been taking metoprolol intermittently.  He admits to daily alcohol use. When EMS arrived patient was found to be in A. fib with rapid ventricular rate and received adenosine 6 mg and 12 mg. He denies having any abdominal pain, no changes in his bowel habits, no urinary symptoms, no fever, no chills, no cough, no headache, no diaphoresis, no lower extremity swelling, no focal deficits, no orthopnea or paroxysmal nocturnal dyspnea. Labs show sodium 137, potassium 3.6, chloride 105, bicarb 23, glucose 80, BUN 10, creatinine 1.01, calcium 9.2, magnesium 2.0, alkaline phosphatase 72, albumin 3.7, lipase 73, AST 19, ALT 20, total protein 6.8, BNP 230, white count 6.4, hemoglobin 14.0, hematocrit 38.9, MCV 91.7, RDW 17.7, platelet count 220, TSH 1.58 Urine analysis is sterile Urine drug screen is negative Chest x-ray reviewed by me shows no active disease. No evidence of pneumonia or pulmonary edema. Stable mild cardiomegaly. Twelve-lead EKG reviewed by me shows rapid atrial fibrillation with  ST changes in the lateral leads and T wave abnormality in the inferior leads.   ED Course: Patient is a 65 year old African-American male who presents to the ER via EMS for evaluation of chest pain which woke him up out of his sleep associated with palpitations, shortness of breath and dizziness. He was noted to be in rapid A. fib by EMS and received 2 doses of adenosine in the field he is currently on a Cardizem drip with improvement in his heart rate to the 120s.  He admits to being noncompliant with prescribed medications. On examination he has epigastric tenderness and his lipase level is elevated consistent with pancreatitis. He will be admitted to the hospital for further evaluation.   Review of Systems: As per HPI otherwise all other systems reviewed and negative.    Past Medical History:  Diagnosis Date   Claudication (HCC)    a. 06/2015 ABI: R - 0.73, L - 0.73. 30-49% bilat SFA stenosis. 50-74% R Profunda stenosis; b. 01/2017 ABI: R 0.89, L 0.88, TBI R 0.65, L 1.0.   Erectile dysfunction    Essential hypertension    History of echocardiogram    a. 03/29/2014: EF 55-60%, mild LVH, normal RVSP;  b. 05/2015 Echo: EF 60-65%, triv AI, mild MR; c. 07/2020 Echo:    Hyperlipidemia    Non-obstructive CAD    a. 01/27/2014 Cath: LM nl, LAD mild diff dz w/o obs, LCx no sig obs - scattered 20-30% mLCx, RCA no obs dz, EF 70%; b. 06/2015 MV; EF 60%, no ischemia.   PAF (paroxysmal atrial fibrillation) (HCC)    a. Pt says  Dx >76yrs ago w/ ? RFCA @ [facility];  b. Recurrent 12/2013;  b. Rx Flecainide and Xarelto-->Intermittent compliance.   PVC's (premature ventricular contractions)    a. 05/2017 Zio: 4000 PVCs in 48 hrs (2%). Brief run of SVT.   Tobacco abuse    a. ongoing - 1 ppd.    Past Surgical History:  Procedure Laterality Date   CARDIAC CATHETERIZATION     CARD[facility]AC CATHETERIZATION     [facility]   LEFT HEART CATH Right 01/27/2014   Procedure: LEFT HEART CATH;  Surgeon: Micheline Chapman, MD;   Location: [facility] CATH LAB;  Service: Cardiovascular;  Laterality: Right;     reports that he has been smoking cigarettes. He has a 15.00 pack-year smoking history. He has never used smokeless tobacco. He reports current alcohol use of about 12.0 standard drinks of alcohol per week. He reports current drug use. Drug: Cocaine.  No Known Allergies  Family History  Problem Relation Age of Onset   Coronary artery disease Father 34   Diabetes Mother    Diabetes Sister    Diabetes Brother    Diabetes Maternal Grandmother    Diabetes Sister    Diabetes Sister       Prior to Admission medications   Medication Sig Start Date End Date Taking? Authorizing Provider  apixaban (ELIQUIS) 5 MG TABS tablet Take 1 tablet (5 mg total) by mouth 2 (two) times daily. 09/12/20   Zannie Cove, MD  diltiazem (CARDIZEM CD) 240 MG 24 hr capsule Take 1 capsule (240 mg total) by mouth daily. 09/12/20   Zannie Cove, MD  ibuprofen (ADVIL) 800 MG tablet Take 800 mg by mouth every 6 (six) hours as needed. 11/14/20   [provider]  metoprolol succinate (TOPROL-XL) 100 MG 24 hr tablet Take 1 tablet (100 mg total) by mouth daily. Take with or immediately following a meal. 09/12/20   Zannie Cove, MD    Physical Exam: Vitals:   12/07/20 0800 12/07/20 0815 12/07/20 0830 12/07/20 0845  BP: (!) 124/97 101/88 120/81 133/86  Pulse:  (!) 147 73 100  Resp:  18    Temp:      TempSrc:      SpO2:   96% 96%  Weight:      Height:         Vitals:   12/07/20 0800 12/07/20 0815 12/07/20 0830 12/07/20 0845  BP: (!) 124/97 101/88 120/81 133/86  Pulse:  (!) 147 73 100  Resp:  18    Temp:      TempSrc:      SpO2:   96% 96%  Weight:      Height:          Constitutional: Alert and oriented x 3 . Not in any apparent distress HEENT:      Head: Normocephalic and atraumatic.         Eyes: PERLA, EOMI, Conjunctivae are normal. Sclera is non-icteric.       Mouth/Throat: Mucous membranes are moist.        Neck: Supple with no signs of meningismus. Cardiovascular: Irregularly irregular, tachycardic. No murmurs, gallops, or rubs. 2+ symmetrical distal pulses are present . No JVD. No LE edema Respiratory: Respiratory effort normal .Lungs sounds clear bilaterally. No wheezes, crackles, or rhonchi.  Gastrointestinal: Soft, epigastric tenderness, and non distended with positive bowel sounds.  Genitourinary: No CVA tenderness. Musculoskeletal: Nontender with normal range of motion in all extremities. No cyanosis, or erythema of extremities. Neurologic:  Face is symmetric.  Moving all extremities. No gross focal neurologic deficits . Skin: Skin is warm, dry.  No rash or ulcers Psychiatric: Mood and affect are normal    Labs on Admission: I have personally reviewed following labs and imaging studies  CBC: Recent Labs  Lab 12/07/20 0622  WBC 6.4  NEUTROABS 2.1  HGB 14.0  HCT 38.9*  MCV 91.7  PLT 220   Basic Metabolic Panel: Recent Labs  Lab 12/07/20 0622  NA 137  K 3.6  CL 105  CO2 23  GLUCOSE 80  BUN 10  CREATININE 1.01  CALCIUM 9.2  MG 2.0   GFR: Estimated Creatinine Clearance: 65.8 mL/min (by C-G formula based on SCr of 1.01 mg/dL). Liver Function Tests: Recent Labs  Lab 12/07/20 0622  AST 19  ALT 20  ALKPHOS 72  BILITOT 0.4  PROT 6.8  ALBUMIN 3.7   Recent Labs  Lab 12/07/20 0622  LIPASE 73*   No results for input(s): AMMONIA in the last 168 hours. Coagulation Profile: No results for input(s): INR, PROTIME in the last 168 hours. Cardiac Enzymes: No results for input(s): CKTOTAL, CKMB, CKMBINDEX, TROPONINI in the last 168 hours. BNP (last 3 results) No results for input(s): PROBNP in the last 8760 hours. HbA1C: No results for input(s): HGBA1C in the last 72 hours. CBG: No results for input(s): GLUCAP in the last 168 hours. Lipid Profile: No results for input(s): CHOL, HDL, LDLCALC, TRIG, CHOLHDL, LDLDIRECT in the last 72 hours. Thyroid Function  Tests: Recent Labs    12/07/20 0622  TSH 1.588   Anemia Panel: No results for input(s): VITAMINB12, FOLATE, FERRITIN, TIBC, IRON, RETICCTPCT in the last 72 hours. Urine analysis:    Component Value Date/Time   COLORURINE STRAW (A) 12/07/2020 0728   APPEARANCEUR CLEAR (A) 12/07/2020 0728   APPEARANCEUR Clear 07/30/2016 1600   LABSPEC 1.003 (L) 12/07/2020 0728   LABSPEC 1.019 03/28/2014 1430   PHURINE 5.0 12/07/2020 0728   GLUCOSEU NEGATIVE 12/07/2020 0728   GLUCOSEU Negative 03/28/2014 1430   HGBUR NEGATIVE 12/07/2020 0728   BILIRUBINUR NEGATIVE 12/07/2020 0728   BILIRUBINUR Negative 07/30/2016 1600   BILIRUBINUR Negative 03/28/2014 1430   KETONESUR NEGATIVE 12/07/2020 0728   PROTEINUR NEGATIVE 12/07/2020 0728   NITRITE NEGATIVE 12/07/2020 0728   LEUKOCYTESUR NEGATIVE 12/07/2020 0728   LEUKOCYTESUR Negative 03/28/2014 1430    Radiological Exams on Admission: DG Chest Portable 1 View  Result Date: 12/07/2020 CLINICAL DATA:  Chest pain and shortness of breath. EXAM: PORTABLE CHEST 1 VIEW COMPARISON:  Chest x-ray dated 10/12/2020. FINDINGS: Stable mild cardiomegaly. Lungs are clear. No pleural effusion or pneumothorax is seen. Osseous structures about the chest are unremarkable. IMPRESSION: No active disease. No evidence of pneumonia or pulmonary edema. Stable mild cardiomegaly. Electronically Signed   By: Bary Richard M.D.   On: 12/07/2020 07:00     Assessment/Plan Principal Problem:   Atrial fibrillation with rapid ventricular response (HCC) Active Problems:   Tobacco abuse   CAD (coronary artery disease)   Alcohol use disorder, moderate, dependence (HCC)   Alcoholic pancreatitis   Non compliance w medication regimen       Atrial fibrillation with rapid ventricular response Secondary to medication noncompliance and alcohol abuse Continue Cardizem drip initiated in the ER Resume oral Cardizem and metoprolol and wean off Cardizem drip Continue Eliquis as primary  prophylaxis for an acute stroke    Alcohol dependence Patient has been advised to abstain from further alcohol use Will place patient on CIWA protocol and administer lorazepam  for CIWA score of 8 or greater Place patient on MVI, thiamine and folic acid     Alcoholic pancreatitis (Mild) Patient with epigastric tenderness on palpation but no nausea or vomiting Most likely related to alcohol use/abuse Lipase level is elevated at 77 Place patient on a clear liquid diet Supportive care with IV PPI and pain control as needed     Nicotine dependence Smoking cessation was discussed with patient in detail  He declines a nicotine transdermal patch at this time     Noncompliance with medication regimen Patient has been counseled on the need to be compliant with his prescribed medications due to his increased risk for stroke related to his atrial fibrillation. He verbalizes understanding   DVT prophylaxis: Eliquis Code Status: full code  Family Communication: Greater than 50% of time was spent discussing plan of care with patient at the bedside.  All questions and concerns have been addressed.  He verbalizes understanding and agrees with the plan. Disposition Plan: Back to previous home environment Consults called: none  Status: At the time of admission, it appears that the appropriate admission status for this patient is inpatient. This is judged to be reasonable and necessary in order to provide the required intensity of service to ensure the patient's safety given the presenting symptoms, physical exam findings, and initial radiographic and laboratory data in the context of their comorbid conditions. Patient requires inpatient status due to high intensity of service, high risk for further deterioration and high frequency of surveillance required.    Lucile Shutters MD [facility]     12/07/2020, 9:37 AM

## 2020-12-07 NOTE — ED Notes (Signed)
Turkey sandwich tray provided. 

## 2020-12-07 NOTE — ED Provider Notes (Signed)
[facility] Emergency Department Provider Note  ____________________________________________   Event Date/Time   First MD Initiated Contact with Patient 12/07/20 [phone]     (approximate)  I have reviewed the triage vital signs and the nursing notes.   HISTORY  Chief Complaint Chest Pain and Shortness of Breath    HPI Darren Rogers is a 65 y.o. male with history of hypertension, hyperlipidemia, alcohol abuse, paroxysmal atrial fibrillation, medical noncompliance who presents to the emergency department with complaints of palpitations, chest tightness, shortness of breath, dizziness that started about 1 to 2 hours prior to arrival.  Felt like his heart was racing.  States he has not taken his Eliquis every day over the past month.  He recently ran out of his Cardizem but is taking his metoprolol intermittently.  Also reports drinking alcohol today.  States he drinks alcohol every day.  No fevers, cough, vomiting, diarrhea.  Heart rate in the 200s with EMS.  They gave adenosine x2 without relief.        Past Medical History:  Diagnosis Date   Claudication (HCC)    a. 06/2015 ABI: R - 0.73, L - 0.73. 30-49% bilat SFA stenosis. 50-74% R Profunda stenosis; b. 01/2017 ABI: R 0.89, L 0.88, TBI R 0.65, L 1.0.   Erectile dysfunction    Essential hypertension    History of echocardiogram    a. 03/29/2014: EF 55-60%, mild LVH, normal RVSP;  b. 05/2015 Echo: EF 60-65%, triv AI, mild MR; c. 07/2020 Echo:    Hyperlipidemia    Non-obstructive CAD    a. 01/27/2014 Cath: LM nl, LAD mild diff dz w/o obs, LCx no sig obs - scattered 20-30% mLCx, RCA no obs dz, EF 70%; b. 06/2015 MV; EF 60%, no ischemia.   PAF (paroxysmal atrial fibrillation) (HCC)    a. Pt says Dx >60yrs ago w/ ? RFCA @ [facility];  b. Recurrent 12/2013;  b. Rx Flecainide and Xarelto-->Intermittent compliance.   PVC's (premature ventricular contractions)    a. 05/2017 Zio: 4000 PVCs in 48 hrs (2%). Brief run of SVT.    Tobacco abuse    a. ongoing - 1 ppd.    Patient Active Problem List   Diagnosis Date Noted   Atrial fibrillation (HCC) 11/20/2020   Abscess of buccal space of mouth 11/19/2020   Dental abscess 11/19/2020   Facial cellulitis_left 11/19/2020   Cellulitis of face    Atrial fibrillation with RVR (HCC) 08/06/2020   Demand ischemia (HCC)    Polysubstance abuse (HCC)    Rapid atrial fibrillation (HCC) 08/05/2020   Alcohol use disorder, moderate, dependence (HCC) 08/05/2020   Cocaine abuse (HCC) 08/05/2020   Alcohol abuse 10/20/2019   Depression 10/20/2019   A-fib (HCC) 01/15/2017   Personal history of tobacco use, presenting hazards to health 08/17/2016   BPH associated with nocturia 07/30/2016   IFG (impaired fasting glucose) 07/30/2016   Chest pain 08/22/2015   Essential hypertension    PAD (peripheral artery disease) (HCC)    Drug-induced erectile dysfunction 01/22/2015   CAD in native artery 04/11/2014   Paroxysmal atrial fibrillation (HCC)    Tobacco abuse    Hyperlipidemia    Atrial fibrillation with rapid ventricular response (HCC) 01/27/2014    Past Surgical History:  Procedure Laterality Date   CARDIAC CATHETERIZATION     CARD[facility] CATHETERIZATION     [facility]   LEFT HEART CATH Right 01/27/2014   Procedure: LEFT HEART CATH;  Surgeon: Micheline Chapman, MD;  Location: [facility]  CATH LAB;  Service: Cardiovascular;  Laterality: Right;    Prior to Admission medications   Medication Sig Start Date End Date Taking? Authorizing Provider  apixaban (ELIQUIS) 5 MG TABS tablet Take 1 tablet (5 mg total) by mouth 2 (two) times daily. 09/12/20   Zannie Cove, MD  diltiazem (CARDIZEM CD) 240 MG 24 hr capsule Take 1 capsule (240 mg total) by mouth daily. 09/12/20   Zannie Cove, MD  ibuprofen (ADVIL) 800 MG tablet Take 800 mg by mouth every 6 (six) hours as needed. 11/14/20   [provider]  metoprolol succinate (TOPROL-XL) 100 MG 24 hr tablet Take 1 tablet (100 mg total) by mouth  daily. Take with or immediately following a meal. 09/12/20   Zannie Cove, MD    Allergies Patient has no known allergies.  Family History  Problem Relation Age of Onset   Coronary artery disease Father 96   Diabetes Mother    Diabetes Sister    Diabetes Brother    Diabetes Maternal Grandmother    Diabetes Sister    Diabetes Sister     Social History Social History   Tobacco Use   Smoking status: Every Day    Packs/day: 0.50    Years: 30.00    Pack years: 15.00    Types: Cigarettes   Smokeless tobacco: Never  Substance Use Topics   Alcohol use: Yes    Alcohol/week: 12.0 standard drinks    Types: 12 Cans of beer per week   Drug use: Yes    Types: Cocaine    Review of Systems Constitutional: No fever. Eyes: No visual changes. ENT: No sore throat. Cardiovascular: + chest pain. Respiratory: + shortness of breath. Gastrointestinal: No nausea, vomiting, diarrhea. Genitourinary: Negative for dysuria. Musculoskeletal: Negative for back pain. Skin: Negative for rash. Neurological: Negative for focal weakness or numbness.  ____________________________________________   PHYSICAL EXAM:  VITAL SIGNS: ED Triage Vitals  Enc Vitals Group     BP 12/07/20 0615 107/79     Pulse Rate 12/07/20 0615 (!) 153     Resp 12/07/20 0615 20     Temp 12/07/20 0615 97.8 F (36.6 C)     Temp Source 12/07/20 0615 Oral     SpO2 12/07/20 0615 94 %     Weight 12/07/20 0620 155 lb (70.3 kg)     Height 12/07/20 0620 5\' 6"  (1.676 m)     Head Circumference --      Peak Flow --      Pain Score 12/07/20 0620 5     Pain Loc --      Pain Edu? --      Excl. in GC? --    CONSTITUTIONAL: Alert and oriented and responds appropriately to questions. Well-appearing; well-nourished HEAD: Normocephalic EYES: Conjunctivae clear, pupils appear equal, EOM appear intact ENT: normal nose; moist mucous membranes NECK: Supple, normal ROM CARD: Irregularly irregular and tachycardic, S1 and S2  appreciated; no murmurs, no clicks, no rubs, no gallops RESP: Normal chest excursion without splinting or tachypnea; breath sounds clear and equal bilaterally; no wheezes, no rhonchi, no rales, no hypoxia or respiratory distress, speaking full sentences ABD/GI: Normal bowel sounds; non-distended; soft, mildly tender to palpation diffusely throughout the abdomen, no rebound, no guarding, no peritoneal signs, no hepatosplenomegaly BACK: The back appears normal EXT: Normal ROM in all joints; no deformity noted, no edema; no cyanosis SKIN: Normal color for age and race; warm; no rash on exposed skin NEURO: Moves all extremities equally PSYCH: The  patient's mood and manner are appropriate.  ____________________________________________   LABS (all labs ordered are listed, but only abnormal results are displayed)  Labs Reviewed  CBC WITH DIFFERENTIAL/PLATELET - Abnormal; Notable for the following components:      Result Value   HCT 38.9 (*)    RDW 17.7 (*)    All other components within normal limits  BRAIN NATRIURETIC PEPTIDE - Abnormal; Notable for the following components:   B Natriuretic Peptide 230.1 (*)    All other components within normal limits  LIPASE, BLOOD - Abnormal; Notable for the following components:   Lipase 73 (*)    All other components within normal limits  ETHANOL - Abnormal; Notable for the following components:   Alcohol, Ethyl (B) 172 (*)    All other components within normal limits  COMPREHENSIVE METABOLIC PANEL  TSH  MAGNESIUM  URINE DRUG SCREEN, QUALITATIVE ([facility] ONLY)  URINALYSIS, COMPLETE (UACMP) WITH MICROSCOPIC  TROPONIN I (HIGH SENSITIVITY)   ____________________________________________  EKG   EKG Interpretation  Date/Time:  Saturday December 07 2020 06:27:21 EDT Ventricular Rate:  129 PR Interval:    QRS Duration: 78 QT Interval:  296 QTC Calculation: 420 R Axis:   62 Text Interpretation: Atrial fibrillation Repol abnrm suggests ischemia,  inferior leads Minimal ST elevation, lateral leads Confirmed by Rochele Raring [id number]) on 12/07/2020 6:46:35 AM         ____________________________________________  RADIOLOGY Normajean Baxter Dakarai Mcglocklin, personally viewed and evaluated these images (plain radiographs) as part of my medical decision making, as well as reviewing the written report by the radiologist.  ED MD interpretation: Chest x-ray shows cardiomegaly.  No pneumonia, pulmonary edema.  Official radiology report(s): DG Chest Portable 1 View  Result Date: 12/07/2020 CLINICAL DATA:  Chest pain and shortness of breath. EXAM: PORTABLE CHEST 1 VIEW COMPARISON:  Chest x-ray dated 10/12/2020. FINDINGS: Stable mild cardiomegaly. Lungs are clear. No pleural effusion or pneumothorax is seen. Osseous structures about the chest are unremarkable. IMPRESSION: No active disease. No evidence of pneumonia or pulmonary edema. Stable mild cardiomegaly. Electronically Signed   By: Bary Richard M.D.   On: 12/07/2020 07:00    ____________________________________________   PROCEDURES  Procedure(s) performed (including Critical Care):  Procedures  CRITICAL CARE Performed by: Baxter Hire Codi Folkerts   Total critical care time: 55 minutes  Critical care time was exclusive of separately billable procedures and treating other patients.  Critical care was necessary to treat or prevent imminent or life-threatening deterioration.  Critical care was time spent personally by me on the following activities: development of treatment plan with patient and/or surrogate as well as nursing, discussions with consultants, evaluation of patient's response to treatment, examination of patient, obtaining history from patient or surrogate, ordering and performing treatments and interventions, ordering and review of laboratory studies, ordering and review of radiographic studies, pulse oximetry and re-evaluation of patient's  condition.  ____________________________________________   INITIAL IMPRESSION / ASSESSMENT AND PLAN / ED COURSE  As part of my medical decision making, I reviewed the following data within the electronic MEDICAL RECORD NUMBER Nursing notes reviewed and incorporated, Labs reviewed , EKG interpreted , Old EKG reviewed, Old chart reviewed, Radiograph reviewed , Discussed with admitting physician , and Notes from prior ED visits         Patient here with A. fib with RVR.  Reports he is out of his Cardizem.  Also has been drinking alcohol regularly.  Not a candidate for electrocardioversion at this time given noncompliance with Eliquis.  Heart  rate has been in the 200s with EMS.  Currently in the 150s here with normal blood pressure.  Mentating normally.  Complaining of chest pain, shortness of breath and dizziness.  Does not appear volume overloaded.  Cardiac labs pending.  Anticipate admission.  We will start diltiazem infusion.  ED PROGRESS  Patient's labs show normal hemoglobin, electrolytes.  Troponin negative.  BNP minimally elevated but chest x-ray shows no edema.  TSH normal.  Ethanol level 172.  Patient continues to be in atrial fibrillation with RVR.  Will discuss with hospitalist for admission.  7:51 AM Discussed patient's case with hospitalist, Dr. Joylene Igo.  I have recommended admission and patient (and family if present) agree with this plan. Admitting physician will place admission orders.   I reviewed all nursing notes, vitals, pertinent previous records and reviewed/interpreted all EKGs, lab and urine results, imaging (as available).  ____________________________________________   FINAL CLINICAL IMPRESSION(S) / ED DIAGNOSES  Final diagnoses:  Atrial fibrillation with RVR (HCC)  Alcohol abuse     ED Discharge Orders     None       *Please note:  Yasiin Jolliff was evaluated in Emergency Department on 12/07/2020 for the symptoms described in the history of present illness. He  was evaluated in the context of the global COVID-19 pandemic, which necessitated consideration that the patient might be at risk for infection with the SARS-CoV-2 virus that causes COVID-19. Institutional protocols and algorithms that pertain to the evaluation of patients at risk for COVID-19 are in a state of rapid change based on information released by regulatory bodies including the CDC and federal and state organizations. These policies and algorithms were followed during the patient's care in the ED.  Some ED evaluations and interventions may be delayed as a result of limited staffing during and the pandemic.*   Note:  This document was prepared using Dragon voice recognition software and may include unintentional dictation errors.    Joevanni Roddey, Layla Maw, DO 12/07/20 [phone]

## 2020-12-07 NOTE — ED Triage Notes (Signed)
BIBA from home with complaints of CP and SOB, also abdominal pain.  EMS found to be in Afib with RVR.  Received Adenosine 6mg  and 12mg  enroute to this facility.  Patient on PO Cardizem but ran out yesterday.

## 2020-12-07 NOTE — ED Notes (Signed)
Dr Agbata at bedside. 

## 2020-12-08 ENCOUNTER — Encounter: Payer: Self-pay | Admitting: Internal Medicine

## 2020-12-08 DIAGNOSIS — I4891 Unspecified atrial fibrillation: Secondary | ICD-10-CM | POA: Diagnosis not present

## 2020-12-08 DIAGNOSIS — I480 Paroxysmal atrial fibrillation: Secondary | ICD-10-CM | POA: Diagnosis not present

## 2020-12-08 LAB — BASIC METABOLIC PANEL
Anion gap: 4 — ABNORMAL LOW (ref 5–15)
BUN: 9 mg/dL (ref 8–23)
CO2: 24 mmol/L (ref 22–32)
Calcium: 8.9 mg/dL (ref 8.9–10.3)
Chloride: 109 mmol/L (ref 98–111)
Creatinine, Ser: 0.89 mg/dL (ref 0.61–1.24)
GFR, Estimated: >60 mL/min (ref >60)
Glucose, Bld: 91 mg/dL (ref 70–99)
Potassium: 4 mmol/L (ref 3.5–5.1)
Sodium: 137 mmol/L (ref 135–145)

## 2020-12-08 LAB — CBC
HCT: 36.9 % — ABNORMAL LOW (ref 39.0–52.0)
Hemoglobin: 13 g/dL (ref 13.0–17.0)
MCH: 32.4 pg (ref 26.0–34.0)
MCHC: 35.2 g/dL (ref 30.0–36.0)
MCV: 92 fL (ref 80.0–100.0)
Platelets: 208 10*3/uL (ref 150–400)
RBC: 4.01 MIL/uL — ABNORMAL LOW (ref 4.22–5.81)
RDW: 17.6 % — ABNORMAL HIGH (ref 11.5–15.5)
WBC: 5.7 10*3/uL (ref 4.0–10.5)
nRBC: 0 % (ref 0.0–0.2)

## 2020-12-08 MED ORDER — DILTIAZEM HCL ER COATED BEADS 240 MG PO CP24: 240.0000 mg | ORAL_CAPSULE | Freq: Every day | ORAL | 0 refills | Status: DC

## 2020-12-08 MED ORDER — AMOXICILLIN 500 MG PO CAPS
500.0000 mg | ORAL_CAPSULE | Freq: Two times a day (BID) | ORAL | Status: DC
  Administered 2020-12-08: 500 mg via ORAL
  Filled 2020-12-08 (×2): qty 1

## 2020-12-08 MED ORDER — THIAMINE HCL 100 MG PO TABS: 100.0000 mg | ORAL_TABLET | Freq: Every day | ORAL | Status: DC

## 2020-12-08 MED ORDER — APIXABAN 5 MG PO TABS: 5.0000 mg | ORAL_TABLET | Freq: Two times a day (BID) | ORAL | 0 refills | Status: DC

## 2020-12-08 MED ORDER — IBUPROFEN 400 MG PO TABS: 400.0000 mg | ORAL_TABLET | Freq: Four times a day (QID) | ORAL | Status: DC | PRN

## 2020-12-08 MED ORDER — METOPROLOL SUCCINATE ER 100 MG PO TB24: 100.0000 mg | ORAL_TABLET | Freq: Every day | ORAL | 0 refills | Status: DC

## 2020-12-08 NOTE — Care Management Obs Status (Signed)
MEDICARE OBSERVATION STATUS NOTIFICATION   Patient Details  Name: Darren Rogers MRN: [id number] Date of Birth: 04-Mar-1956   Medicare Observation Status Notification Given:  Yes    Margarito Liner, LCSW 12/08/2020, 10:09 AM

## 2020-12-08 NOTE — Care Management CC44 (Signed)
Condition Code 44 Documentation Completed  Patient Details  Name: Darren Rogers MRN: [id number] Date of Birth: 1956/05/03   Condition Code 44 given:  Yes Patient signature on Condition Code 44 notice:  Yes Documentation of 2 MD's agreement:  Yes Code 44 added to claim:  Yes    Margarito Liner, LCSW 12/08/2020, 10:09 AM

## 2020-12-08 NOTE — Discharge Summary (Signed)
Physician Discharge Summary   Darren Rogers  male DOB: 10/15/1955  HWE:[id number]  PCP: Patient, No Pcp Per (Inactive)  Admit date: 12/07/2020 Discharge date: 12/08/2020  Admitted From: home Disposition:  home CODE STATUS: Full code  Discharge Instructions     Amb referral to [facility]   Complete by: As directed    Discharge instructions   Complete by: As directed    You presented with chest pain, but you didn't have a heart attack.  You did have irregular heart beats and high heart rate, which can cause chest pain.  Please take your Cardizem and Toprol as directed to control your heart rate.  Both medications are refilled.  You also need to take Eliquis to prevent a stroke.     Dr. Darlin Priestly [facility] Course:  For full details, please see H&P, progress notes, consult notes and ancillary notes.  Briefly,  Darren Rogers is a 65 y.o. male with medical history significant for polysubstance use (alcohol, tobacco), A fib on Eliquis, HTN, HLD, depression, CAD, claudication, BPH, medication noncompliance who presented to the ER via EMS for evaluation of chest pain that woke him up from sleep.  He admits to being noncompliant with his medications for rate control and recently ran out of his Cardizem but has been taking metoprolol intermittently.  He admits to daily alcohol use. When EMS arrived patient was found to be in A. fib with rapid ventricular rate and received adenosine 6 mg and 12 mg.  Atrial fibrillation with rapid ventricular response Secondary to medication noncompliance and alcohol abuse Cardizem drip initiated in the ER and was titrated off on the same day and resumed on home oral Cardizem and metoprolol.  Eliquis continued.   Both Cardizem and metop refilled at discharge.   Alcohol dependence Patient has been advised to abstain from further alcohol use   Alcoholic pancreatitis (Mild) Patient with epigastric tenderness on palpation but no nausea or  vomiting Most likely related to alcohol use/abuse Lipase level 77.  Pt tolerated diet and without abdominal pain prior to discharge.    Nicotine dependence Smoking cessation was discussed with patient in detail  He declined a nicotine transdermal patch at this time   Noncompliance with medication regimen Patient has been counseled on the need to be compliant with his prescribed medications due to his increased risk for stroke related to his atrial fibrillation. He verbalizes understanding    Discharge Diagnoses:  Principal Problem:   Atrial fibrillation with rapid ventricular response (HCC) Active Problems:   Tobacco abuse   CAD (coronary artery disease)   Chest pain   Alcohol use disorder, moderate, dependence (HCC)   Alcoholic pancreatitis   Non compliance w medication regimen   30 Day Unplanned Readmission Risk Score    Flowsheet Row ED to Hosp-Admission (Current) from 12/07/2020 in [facility]  30 Day Unplanned Readmission Risk Score (%) 17.8 Filed at 12/08/2020 0801       This score is the patient's risk of an unplanned readmission within 30 days of being discharged (0 -100%). The score is based on dignosis, age, lab data, medications, orders, and past utilization.   Low:  0-14.9   Medium: 15-21.9   High: 22-29.9   Extreme: 30 and above          Discharge Instructions:  Allergies as of 12/08/2020   No Known Allergies      Medication List  TAKE these medications    amoxicillin 500 MG capsule Commonly known as: AMOXIL Take 500 mg by mouth as needed.   apixaban 5 MG Tabs tablet Commonly known as: ELIQUIS Take 1 tablet (5 mg total) by mouth 2 (two) times daily.   diltiazem 240 MG 24 hr capsule Commonly known as: CARDIZEM CD Take 1 capsule (240 mg total) by mouth daily.   ibuprofen 400 MG tablet Commonly known as: ADVIL Take 1 tablet (400 mg total) by mouth every 6 (six) hours as needed. What changed:  medication  strength how much to take   metoprolol succinate 100 MG 24 hr tablet Commonly known as: TOPROL-XL Take 1 tablet (100 mg total) by mouth daily. What changed: additional instructions   thiamine 100 MG tablet Take 1 tablet (100 mg total) by mouth daily. Start taking on: December 09, 2020         Follow-up Information     Iran Ouch, MD Follow up in 1 week(s).   Specialty: Cardiology Contact information: [address] [place] [address] [phone]                 No Known Allergies   The results of significant diagnostics from this hospitalization (including imaging, microbiology, ancillary and laboratory) are listed below for reference.   Consultations:   Procedures/Studies: CT Head Wo Contrast  Result Date: 11/19/2020 CLINICAL DATA:  Dizziness, nonspecific.  Dental abscess.  On Eliquis EXAM: CT HEAD WITHOUT CONTRAST TECHNIQUE: Contiguous axial images were obtained from the base of the skull through the vertex without intravenous contrast. COMPARISON:  09/10/2020 FINDINGS: Brain: No evidence of acute infarction, hemorrhage, hydrocephalus, extra-axial collection or mass lesion/mass effect. Generalized atrophy. Vascular: No hyperdense vessel or unexpected calcification. Skull: Normal. Negative for fracture or focal lesion. Sinuses/Orbits: Negative IMPRESSION: No acute or interval finding. Electronically Signed   By: Marnee Spring M.D.   On: 11/19/2020 05:47   CT Maxillofacial W Contrast  Result Date: 11/19/2020 CLINICAL DATA:  Upper and lower dense fall abscess on the left. Patient recently started antibiotics. EXAM: CT MAXILLOFACIAL WITH CONTRAST TECHNIQUE: Multidetector CT imaging of the maxillofacial structures was performed with intravenous contrast. Multiplanar CT image reconstructions were also generated. CONTRAST:  6mL OMNIPAQUE IOHEXOL 300 MG/ML  SOLN COMPARISON:  None similar FINDINGS: Osseous: Devitalized tooth 18 with periapical  erosion and alveolar dehiscence. There is an abscess extending laterally from the tooth which measures 21 x 12 mm. There is medial bony dehiscence as well but no medial/oral cavity inflammation is seen. Sclerosis around the devitalized tooth root is presumably condensing osteitis. Orbits: Negative. No traumatic or inflammatory finding. Sinuses: Negative Soft tissues: Cellulitis on the left around the abscess. Limited intracranial: Negative IMPRESSION: Odontogenic left buccal abscess measuring 21 x 12 mm with regional cellulitis. The offending tooth is #18. Electronically Signed   By: Marnee Spring M.D.   On: 11/19/2020 05:41   DG Chest Portable 1 View  Result Date: 12/07/2020 CLINICAL DATA:  Chest pain and shortness of breath. EXAM: PORTABLE CHEST 1 VIEW COMPARISON:  Chest x-ray dated 10/12/2020. FINDINGS: Stable mild cardiomegaly. Lungs are clear. No pleural effusion or pneumothorax is seen. Osseous structures about the chest are unremarkable. IMPRESSION: No active disease. No evidence of pneumonia or pulmonary edema. Stable mild cardiomegaly. Electronically Signed   By: Bary Richard M.D.   On: 12/07/2020 07:00   DG Chest Port 1 View  Result Date: 11/19/2020 CLINICAL DATA:  Atrial fibrillation with RVR EXAM: PORTABLE CHEST  1 VIEW COMPARISON:  10/12/2020 FINDINGS: Normal heart size and mediastinal contours. No acute infiltrate or edema. No effusion or pneumothorax. No acute osseous findings. The apices are excluded from view. Artifact from EKG leads. IMPRESSION: 1. No evidence of active disease. 2. Excluded apices. Electronically Signed   By: Marnee Spring M.D.   On: 11/19/2020 04:31      Labs: BNP (last 3 results) Recent Labs    08/05/20 0420 08/12/20 0740 12/07/20 0622  BNP 93.2 185.1* 230.1*   Basic Metabolic Panel: Recent Labs  Lab 12/07/20 0622 12/08/20 0618  NA 137 137  K 3.6 4.0  CL 105 109  CO2 23 24  GLUCOSE 80 91  BUN 10 9  CREATININE 1.01 0.89  CALCIUM 9.2 8.9  MG 2.0   --    Liver Function Tests: Recent Labs  Lab 12/07/20 0622  AST 19  ALT 20  ALKPHOS 72  BILITOT 0.4  PROT 6.8  ALBUMIN 3.7   Recent Labs  Lab 12/07/20 0622  LIPASE 73*   No results for input(s): AMMONIA in the last 168 hours. CBC: Recent Labs  Lab 12/07/20 0622 12/08/20 0618  WBC 6.4 5.7  NEUTROABS 2.1  --   HGB 14.0 13.0  HCT 38.9* 36.9*  MCV 91.7 92.0  PLT 220 208   Cardiac Enzymes: No results for input(s): CKTOTAL, CKMB, CKMBINDEX, TROPONINI in the last 168 hours. BNP: Invalid input(s): POCBNP CBG: Recent Labs  Lab 12/07/20 2045  GLUCAP 84   D-Dimer No results for input(s): DDIMER in the last 72 hours. Hgb A1c No results for input(s): HGBA1C in the last 72 hours. Lipid Profile No results for input(s): CHOL, HDL, LDLCALC, TRIG, CHOLHDL, LDLDIRECT in the last 72 hours. Thyroid function studies Recent Labs    12/07/20 0928  TSH 0.757   Anemia work up No results for input(s): VITAMINB12, FOLATE, FERRITIN, TIBC, IRON, RETICCTPCT in the last 72 hours. Urinalysis    Component Value Date/Time   COLORURINE STRAW (A) 12/07/2020 0728   APPEARANCEUR CLEAR (A) 12/07/2020 0728   APPEARANCEUR Clear 07/30/2016 1600   LABSPEC 1.003 (L) 12/07/2020 0728   LABSPEC 1.019 03/28/2014 1430   PHURINE 5.0 12/07/2020 0728   GLUCOSEU NEGATIVE 12/07/2020 0728   GLUCOSEU Negative 03/28/2014 1430   HGBUR NEGATIVE 12/07/2020 0728   BILIRUBINUR NEGATIVE 12/07/2020 0728   BILIRUBINUR Negative 07/30/2016 1600   BILIRUBINUR Negative 03/28/2014 1430   KETONESUR NEGATIVE 12/07/2020 0728   PROTEINUR NEGATIVE 12/07/2020 0728   NITRITE NEGATIVE 12/07/2020 0728   LEUKOCYTESUR NEGATIVE 12/07/2020 0728   LEUKOCYTESUR Negative 03/28/2014 1430   Sepsis Labs Invalid input(s): PROCALCITONIN,  WBC,  LACTICIDVEN Microbiology Recent Results (from the past 240 hour(s))  SARS CORONAVIRUS 2 (TAT 6-24 HRS) Nasopharyngeal Nasopharyngeal Swab     Status: None   Collection Time: 12/07/20   6:22 AM   Specimen: Nasopharyngeal Swab  Result Value Ref Range Status   SARS Coronavirus 2 NEGATIVE NEGATIVE Final    Comment: (NOTE) SARS-CoV-2 target nucleic acids are NOT DETECTED.  The SARS-CoV-2 RNA is generally detectable in upper and lower respiratory specimens during the acute phase of infection. Negative results do not preclude SARS-CoV-2 infection, do not rule out co-infections with other pathogens, and should not be used as the sole basis for treatment or other patient management decisions. Negative results must be combined with clinical observations, patient history, and epidemiological information. The expected result is Negative.  Fact Sheet for Patients: HairSlick.no  Fact Sheet for Healthcare Providers: quierodirigir.com  This  test is not yet approved or cleared by the [place] and  has been authorized for detection and/or diagnosis of SARS-CoV-2 by FDA under an Emergency Use Authorization (EUA). This EUA will remain  in effect (meaning this test can be used) for the duration of the COVID-19 declaration under Se ction 564(b)(1) of the Act, 21 U.S.C. section 360bbb-3(b)(1), unless the authorization is terminated or revoked sooner.  Performed at [facility], [address], [place], [place] [address]      Total time spend on discharging this patient, including the last patient exam, discussing the hospital stay, instructions for ongoing care as it relates to all pertinent caregivers, as well as preparing the medical discharge records, prescriptions, and/or referrals as applicable, is 45 minutes.    Darlin Priestly, MD  [facility] 12/08/2020, 9:25 AM

## 2020-12-10 ENCOUNTER — Encounter: Payer: Self-pay | Admitting: Emergency Medicine

## 2020-12-10 ENCOUNTER — Other Ambulatory Visit: Payer: Self-pay

## 2020-12-10 ENCOUNTER — Emergency Department: Payer: Medicare Other

## 2020-12-10 ENCOUNTER — Emergency Department
Admission: EM | Admit: 2020-12-10 | Discharge: 2020-12-10 | Disposition: A | Discharge status: Home / Self Care | Payer: Medicare Other | Attending: Emergency Medicine | Admitting: Emergency Medicine

## 2020-12-10 DIAGNOSIS — I10 Essential (primary) hypertension: Secondary | ICD-10-CM | POA: Insufficient documentation

## 2020-12-10 DIAGNOSIS — I4891 Unspecified atrial fibrillation: Secondary | ICD-10-CM | POA: Diagnosis not present

## 2020-12-10 DIAGNOSIS — R079 Chest pain, unspecified: Secondary | ICD-10-CM | POA: Diagnosis present

## 2020-12-10 DIAGNOSIS — F17210 Nicotine dependence, cigarettes, uncomplicated: Secondary | ICD-10-CM | POA: Diagnosis not present

## 2020-12-10 DIAGNOSIS — R0789 Other chest pain: Secondary | ICD-10-CM

## 2020-12-10 DIAGNOSIS — Z7901 Long term (current) use of anticoagulants: Secondary | ICD-10-CM | POA: Diagnosis not present

## 2020-12-10 DIAGNOSIS — I2510 Atherosclerotic heart disease of native coronary artery without angina pectoris: Secondary | ICD-10-CM | POA: Diagnosis not present

## 2020-12-10 DIAGNOSIS — Z79899 Other long term (current) drug therapy: Secondary | ICD-10-CM | POA: Insufficient documentation

## 2020-12-10 LAB — CBC
HCT: 39.5 % (ref 39.0–52.0)
Hemoglobin: 13.4 g/dL (ref 13.0–17.0)
MCH: 31.7 pg (ref 26.0–34.0)
MCHC: 33.9 g/dL (ref 30.0–36.0)
MCV: 93.4 fL (ref 80.0–100.0)
Platelets: 210 10*3/uL (ref 150–400)
RBC: 4.23 MIL/uL (ref 4.22–5.81)
RDW: 17.2 % — ABNORMAL HIGH (ref 11.5–15.5)
WBC: 5.3 10*3/uL (ref 4.0–10.5)
nRBC: 0 % (ref 0.0–0.2)

## 2020-12-10 LAB — COMPREHENSIVE METABOLIC PANEL
ALT: 29 U/L (ref 0–44)
AST: 65 U/L — ABNORMAL HIGH (ref 15–41)
Albumin: 3.6 g/dL (ref 3.5–5.0)
Alkaline Phosphatase: 74 U/L (ref 38–126)
Anion gap: 7 (ref 5–15)
BUN: 18 mg/dL (ref 8–23)
CO2: 24 mmol/L (ref 22–32)
Calcium: 9.3 mg/dL (ref 8.9–10.3)
Chloride: 106 mmol/L (ref 98–111)
Creatinine, Ser: 1.41 mg/dL — ABNORMAL HIGH (ref 0.61–1.24)
GFR, Estimated: 55 mL/min — ABNORMAL LOW (ref >60)
Glucose, Bld: 92 mg/dL (ref 70–99)
Potassium: 4.3 mmol/L (ref 3.5–5.1)
Sodium: 137 mmol/L (ref 135–145)
Total Bilirubin: 0.9 mg/dL (ref 0.3–1.2)
Total Protein: 6.7 g/dL (ref 6.5–8.1)

## 2020-12-10 LAB — TROPONIN I (HIGH SENSITIVITY): Troponin I (High Sensitivity): 6 ng/L (ref <18)

## 2020-12-10 MED ORDER — DILTIAZEM HCL 25 MG/5ML IV SOLN
15.0000 mg | Freq: Once | INTRAVENOUS | Status: AC
  Administered 2020-12-10: 15 mg via INTRAVENOUS
  Filled 2020-12-10: qty 5

## 2020-12-10 MED ORDER — DILTIAZEM HCL ER COATED BEADS 240 MG PO CP24
240.0000 mg | ORAL_CAPSULE | Freq: Once | ORAL | Status: AC
  Administered 2020-12-10: 240 mg via ORAL
  Filled 2020-12-10: qty 1

## 2020-12-10 NOTE — ED Notes (Signed)
X-Ray at bedside.

## 2020-12-10 NOTE — ED Provider Notes (Signed)
[facility] Emergency Department Provider Note   ____________________________________________    I have reviewed the triage vital signs and the nursing notes.   HISTORY  Chief Complaint Chest Pain and Atrial Fibrillation     HPI Darren Rogers is a 65 y.o. male who presents with complaints of chest pain and palpitations.  Patient has a history of atrial fibrillation, recently discharged from the hospital 2 days ago, medical records reviewed.  Denies significant shortness of breath.  No fevers chills.  Reports he took his last Cardizem yesterday, has not taken Cardizem today but did take metoprolol and Eliquis.  has a history of medication noncompliance.  Reports that chest pain has improved at this time, did receive IV metoprolol via EMS  Past Medical History:  Diagnosis Date   Claudication (HCC)    a. 06/2015 ABI: R - 0.73, L - 0.73. 30-49% bilat SFA stenosis. 50-74% R Profunda stenosis; b. 01/2017 ABI: R 0.89, L 0.88, TBI R 0.65, L 1.0.   Erectile dysfunction    Essential hypertension    History of echocardiogram    a. 03/29/2014: EF 55-60%, mild LVH, normal RVSP;  b. 05/2015 Echo: EF 60-65%, triv AI, mild MR; c. 07/2020 Echo:    Hyperlipidemia    Non-obstructive CAD    a. 01/27/2014 Cath: LM nl, LAD mild diff dz w/o obs, LCx no sig obs - scattered 20-30% mLCx, RCA no obs dz, EF 70%; b. 06/2015 MV; EF 60%, no ischemia.   PAF (paroxysmal atrial fibrillation) (HCC)    a. Pt says Dx >58yrs ago w/ ? RFCA @ [facility];  b. Recurrent 12/2013;  b. Rx Flecainide and Xarelto-->Intermittent compliance.   PVC's (premature ventricular contractions)    a. 05/2017 [facility]: 4000 PVCs in 48 hrs (2%). Brief run of SVT.   Tobacco abuse    a. ongoing - 1 ppd.    Patient Active Problem List   Diagnosis Date Noted   Alcoholic pancreatitis 12/07/2020   Non compliance w medication regimen 12/07/2020   Atrial fibrillation (HCC) 11/20/2020   Abscess of buccal space of mouth 11/19/2020    Dental abscess 11/19/2020   Facial cellulitis_left 11/19/2020   Cellulitis of face    Atrial fibrillation with RVR (HCC) 08/06/2020   Demand ischemia (HCC)    Polysubstance abuse (HCC)    Rapid atrial fibrillation (HCC) 08/05/2020   Alcohol use disorder, moderate, dependence (HCC) 08/05/2020   Cocaine abuse (HCC) 08/05/2020   Alcohol abuse 10/20/2019   Depression 10/20/2019   A-fib (HCC) 01/15/2017   Personal history of tobacco use, presenting hazards to health 08/17/2016   BPH associated with nocturia 07/30/2016   IFG (impaired fasting glucose) 07/30/2016   Chest pain 08/22/2015   Essential hypertension    PAD (peripheral artery disease) (HCC)    Drug-induced erectile dysfunction 01/22/2015   CAD (coronary artery disease) 04/11/2014   CAD in native artery 04/11/2014   Paroxysmal atrial fibrillation (HCC)    Tobacco abuse    Hyperlipidemia    Atrial fibrillation with rapid ventricular response (HCC) 01/27/2014    Past Surgical History:  Procedure Laterality Date   CARDIAC CATHETERIZATION     CARDIAC CATHETERIZATION     [facility]   LEFT HEART CATH Right 01/27/2014   Procedure: LEFT HEART CATH;  Surgeon: Micheline Chapman, MD;  Location: [facility] CATH LAB;  Service: Cardiovascular;  Laterality: Right;    Prior to Admission medications   Medication Sig Start Date End Date Taking? Authorizing Provider  apixaban (  ELIQUIS) 5 MG TABS tablet Take 1 tablet (5 mg total) by mouth 2 (two) times daily. 12/08/20 03/08/21 Yes Darlin Priestly, MD  diltiazem (CARDIZEM CD) 240 MG 24 hr capsule Take 1 capsule (240 mg total) by mouth daily. 12/08/20 03/08/21 Yes Darlin Priestly, MD  metoprolol succinate (TOPROL-XL) 100 MG 24 hr tablet Take 1 tablet (100 mg total) by mouth daily. 12/08/20 03/08/21 Yes Darlin Priestly, MD  thiamine 100 MG tablet Take 1 tablet (100 mg total) by mouth daily. 12/09/20  Yes Darlin Priestly, MD  amoxicillin (AMOXIL) 500 MG capsule Take 500 mg by mouth as needed.    [provider]  ibuprofen  (ADVIL) 400 MG tablet Take 1 tablet (400 mg total) by mouth every 6 (six) hours as needed. Patient taking differently: Take 400 mg by mouth every 6 (six) hours as needed for fever, headache or mild pain. 12/08/20   Darlin Priestly, MD     Allergies Patient has no known allergies.  Family History  Problem Relation Age of Onset   Coronary artery disease Father 26   Diabetes Mother    Diabetes Sister    Diabetes Brother    Diabetes Maternal Grandmother    Diabetes Sister    Diabetes Sister     Social History Social History   Tobacco Use   Smoking status: Every Day    Packs/day: 0.50    Years: 30.00    Pack years: 15.00    Types: Cigarettes   Smokeless tobacco: Never  Substance Use Topics   Alcohol use: Yes    Alcohol/week: 12.0 standard drinks    Types: 12 Cans of beer per week   Drug use: Yes    Types: Cocaine    Review of Systems  Constitutional: No fever/chills Eyes: No visual changes.  ENT: No sore throat. Cardiovascular: As above Respiratory: Denies shortness of breath. Gastrointestinal: No abdominal pain.  No nausea, no vomiting.   Genitourinary: Negative for dysuria. Musculoskeletal: Negative for back pain. Skin: Negative for rash. Neurological: Negative for headaches or weakness   ____________________________________________   PHYSICAL EXAM:  VITAL SIGNS: ED Triage Vitals  Enc Vitals Group     BP 12/10/20 0728 (!) 158/96     Pulse Rate 12/10/20 0728 (!) 146     Resp 12/10/20 0728 (!) 23     Temp 12/10/20 0728 97.7 F (36.5 C)     Temp Source 12/10/20 0728 Oral     SpO2 12/10/20 0728 99 %     Weight 12/10/20 0726 70.3 kg (155 lb)     Height 12/10/20 0726 1.676 m (5\' 6" )     Head Circumference --      Peak Flow --      Pain Score 12/10/20 0725 5     Pain Loc --      Pain Edu? --      Excl. in GC? --     Constitutional: Alert and oriented.  Eyes: Conjunctivae are normal.  Head: Atraumatic. Nose: No congestion/rhinnorhea. Mouth/Throat: Mucous  membranes are moist.   Neck:  Painless ROM Cardiovascular: Tachycardia, regular rhythm grossly normal heart sounds.  Good peripheral circulation. Respiratory: Normal respiratory effort.  No retractions. Lungs CTAB. Gastrointestinal: Soft and nontender. No distention.  No CVA tenderness.  Musculoskeletal: No lower extremity tenderness nor edema.  Warm and well perfused Neurologic:  Normal speech and language. No gross focal neurologic deficits are appreciated.  Skin:  Skin is warm, dry and intact. No rash noted. Psychiatric: Mood and affect are  normal. Speech and behavior are normal.  ____________________________________________   LABS (all labs ordered are listed, but only abnormal results are displayed)  Labs Reviewed  CBC - Abnormal; Notable for the following components:      Result Value   RDW 17.2 (*)    All other components within normal limits  COMPREHENSIVE METABOLIC PANEL - Abnormal; Notable for the following components:   Creatinine, Ser 1.41 (*)    AST 65 (*)    GFR, Estimated 55 (*)    All other components within normal limits  TROPONIN I (HIGH SENSITIVITY)   ____________________________________________  EKG  ED ECG REPORT I, Jene Every, the attending physician, personally viewed and interpreted this ECG.  Date: 12/10/2020  Rhythm: Atrial fibrillation QRS Axis: normal Intervals: Abnormal ST/T Wave abnormalities: normal Narrative Interpretation: no evidence of acute ischemia  ____________________________________________  RADIOLOGY  Chest x-ray viewed by me ____________________________________________   PROCEDURES  Procedure(s) performed: No  Procedures   Critical Care performed: yes  CRITICAL CARE Performed by: Jene Every   Total critical care time: 30 minutes  Critical care time was exclusive of separately billable procedures and treating other patients.  Critical care was necessary to treat or prevent imminent or life-threatening  deterioration.  Critical care was time spent personally by me on the following activities: development of treatment plan with patient and/or surrogate as well as nursing, discussions with consultants, evaluation of patient's response to treatment, examination of patient, obtaining history from patient or surrogate, ordering and performing treatments and interventions, ordering and review of laboratory studies, ordering and review of radiographic studies, pulse oximetry and re-evaluation of patient's condition.  ____________________________________________   INITIAL IMPRESSION / ASSESSMENT AND PLAN / ED COURSE  Pertinent labs & imaging results that were available during my care of the patient were reviewed by me and considered in my medical decision making (see chart for details).   Patient with a history of atrial fibrillation on Eliquis however his dosing is not consistent.  Took Cardizem yesterday however not today.  Did take metoprolol today.  Feeling somewhat better after IV metoprolol.  Recent admission for similar situation.  Heart rate here ranging from 1 10-1 30s.  We will give a dose of IV Cardizem while we await labs.  After IV Cardizem patient's heart rate has normalized, will give home dose of Cardizem 240 CD and monitor in the emergency department.  Patient's heart rate normalized, he is chest pain-free, lab work including troponin are reassuring.  No indication for admission at this time, emphasized the need for compliance with her medications    ____________________________________________   FINAL CLINICAL IMPRESSION(S) / ED DIAGNOSES  Final diagnoses:  Atrial fibrillation with rapid ventricular response (HCC)  Atypical chest pain        Note:  This document was prepared using Dragon voice recognition software and may include unintentional dictation errors.    Jene Every, MD 12/10/20 [phone]

## 2020-12-10 NOTE — ED Notes (Signed)
Dr. Cyril Loosen MD at bedside.

## 2020-12-10 NOTE — Discharge Instructions (Addendum)
It is critical that you take your medication every day as prescribed, return to the emergency department if your symptoms return.  Please follow-up with all physicians as recently described during your admission

## 2020-12-10 NOTE — ED Notes (Signed)
Pt unable to sign due to failed topaz

## 2020-12-10 NOTE — ED Triage Notes (Signed)
Pt via EMS from home. Pt centralized CP that radiates to his L arm that started 0530 this AM. EMS states that he was in A fib RVR in the rate of 130-160bpm. Pt recently admitted and d/c for the same. Pt is A&Ox4 and NAD.   EMS gave 5 of Metoprolol and 250 of NS.

## 2020-12-10 NOTE — ED Notes (Signed)
Pt given sandwich tray and water at this time with Dr. Cyril Loosen approval.

## 2020-12-10 NOTE — ED Notes (Signed)
Pt ambulatory to the restroom at this time.  

## 2020-12-10 NOTE — ED Notes (Signed)
Pt given taxi vocuher at this time. Called [place] at this time. Sam, RN and Erskine Squibb, first nurse notified.

## 2021-01-30 ENCOUNTER — Emergency Department
Admission: EM | Admit: 2021-01-30 | Discharge: 2021-01-30 | Disposition: A | Discharge status: Home / Self Care | Payer: Medicare Other | Attending: Emergency Medicine | Admitting: Emergency Medicine

## 2021-01-30 ENCOUNTER — Other Ambulatory Visit: Payer: Self-pay

## 2021-01-30 ENCOUNTER — Emergency Department: Payer: Medicare Other

## 2021-01-30 DIAGNOSIS — I2510 Atherosclerotic heart disease of native coronary artery without angina pectoris: Secondary | ICD-10-CM | POA: Insufficient documentation

## 2021-01-30 DIAGNOSIS — I10 Essential (primary) hypertension: Secondary | ICD-10-CM | POA: Diagnosis not present

## 2021-01-30 DIAGNOSIS — I4891 Unspecified atrial fibrillation: Secondary | ICD-10-CM

## 2021-01-30 DIAGNOSIS — F17210 Nicotine dependence, cigarettes, uncomplicated: Secondary | ICD-10-CM | POA: Diagnosis not present

## 2021-01-30 DIAGNOSIS — Z79899 Other long term (current) drug therapy: Secondary | ICD-10-CM | POA: Diagnosis not present

## 2021-01-30 DIAGNOSIS — R072 Precordial pain: Secondary | ICD-10-CM | POA: Diagnosis present

## 2021-01-30 DIAGNOSIS — R079 Chest pain, unspecified: Secondary | ICD-10-CM

## 2021-01-30 DIAGNOSIS — Z7901 Long term (current) use of anticoagulants: Secondary | ICD-10-CM | POA: Insufficient documentation

## 2021-01-30 LAB — CBC
HCT: 40.7 % (ref 39.0–52.0)
Hemoglobin: 14.6 g/dL (ref 13.0–17.0)
MCH: 34 pg (ref 26.0–34.0)
MCHC: 35.9 g/dL (ref 30.0–36.0)
MCV: 94.9 fL (ref 80.0–100.0)
Platelets: 210 10*3/uL (ref 150–400)
RBC: 4.29 MIL/uL (ref 4.22–5.81)
RDW: 16.6 % — ABNORMAL HIGH (ref 11.5–15.5)
WBC: 5.9 10*3/uL (ref 4.0–10.5)
nRBC: 0 % (ref 0.0–0.2)

## 2021-01-30 LAB — COMPREHENSIVE METABOLIC PANEL
ALT: 17 U/L (ref 0–44)
AST: 23 U/L (ref 15–41)
Albumin: 3.7 g/dL (ref 3.5–5.0)
Alkaline Phosphatase: 57 U/L (ref 38–126)
Anion gap: 8 (ref 5–15)
BUN: 16 mg/dL (ref 8–23)
CO2: 23 mmol/L (ref 22–32)
Calcium: 8.8 mg/dL — ABNORMAL LOW (ref 8.9–10.3)
Chloride: 107 mmol/L (ref 98–111)
Creatinine, Ser: 1.12 mg/dL (ref 0.61–1.24)
GFR, Estimated: >60 mL/min (ref >60)
Glucose, Bld: 95 mg/dL (ref 70–99)
Potassium: 3.7 mmol/L (ref 3.5–5.1)
Sodium: 138 mmol/L (ref 135–145)
Total Bilirubin: 0.6 mg/dL (ref 0.3–1.2)
Total Protein: 6.7 g/dL (ref 6.5–8.1)

## 2021-01-30 LAB — BASIC METABOLIC PANEL
Anion gap: 7 (ref 5–15)
BUN: 18 mg/dL (ref 8–23)
CO2: 25 mmol/L (ref 22–32)
Calcium: 8.9 mg/dL (ref 8.9–10.3)
Chloride: 106 mmol/L (ref 98–111)
Creatinine, Ser: 1.17 mg/dL (ref 0.61–1.24)
GFR, Estimated: >60 mL/min (ref >60)
Glucose, Bld: 92 mg/dL (ref 70–99)
Potassium: 4 mmol/L (ref 3.5–5.1)
Sodium: 138 mmol/L (ref 135–145)

## 2021-01-30 LAB — TROPONIN I (HIGH SENSITIVITY)
Troponin I (High Sensitivity): 6 ng/L (ref <18)
Troponin I (High Sensitivity): 6 ng/L (ref <18)

## 2021-01-30 LAB — BRAIN NATRIURETIC PEPTIDE: B Natriuretic Peptide: 252.8 pg/mL — ABNORMAL HIGH (ref 0.0–100.0)

## 2021-01-30 NOTE — ED Notes (Signed)
Door closed for pt privacy so pt could void.

## 2021-01-30 NOTE — ED Provider Notes (Signed)
-----------------------------------------   9:28 AM on 01/30/2021 ----------------------------------------- Patient repeat troponin remains negative.  Heart rate remains between 65 and 90 bpm.  Patient states he is feeling much better.  Patient will be discharged home with cardiology follow-up.   Minna Antis, MD 01/30/21 [phone]

## 2021-01-30 NOTE — ED Provider Notes (Signed)
[facility] Emergency Department Provider Note   ____________________________________________   Event Date/Time   First MD Initiated Contact with Patient 01/30/21 [phone]     (approximate)  I have reviewed the triage vital signs and the nursing notes.   HISTORY  Chief Complaint Chest Pain    HPI Darren Rogers is a 65 y.o. male who presents for chest pain via EMS  LOCATION: Substernal DURATION: 3 hours prior to arrival TIMING: Resolved since onset SEVERITY: Started 8/10 and is now 0/10 QUALITY: Burning/pressure CONTEXT: Patient states that he had a sub sandwich which that did not agree with him just prior to symptoms beginning.  Patient describes burning substernal chest pressure that resolved after the ride with EMS where he received metoprolol and his heart rate improved MODIFYING FACTORS: Reduction in heart rate improved his chest pain ASSOCIATED SYMPTOMS: Palpitations   Per medical record review, patient has history of persistent atrial fibrillation          Past Medical History:  Diagnosis Date   Claudication (HCC)    a. 06/2015 ABI: R - 0.73, L - 0.73. 30-49% bilat SFA stenosis. 50-74% R Profunda stenosis; b. 01/2017 ABI: R 0.89, L 0.88, TBI R 0.65, L 1.0.   Erectile dysfunction    Essential hypertension    History of echocardiogram    a. 03/29/2014: EF 55-60%, mild LVH, normal RVSP;  b. 05/2015 Echo: EF 60-65%, triv AI, mild MR; c. 07/2020 Echo:    Hyperlipidemia    Non-obstructive CAD    a. 01/27/2014 Cath: LM nl, LAD mild diff dz w/o obs, LCx no sig obs - scattered 20-30% mLCx, RCA no obs dz, EF 70%; b. 06/2015 MV; EF 60%, no ischemia.   PAF (paroxysmal atrial fibrillation) (HCC)    a. Pt says Dx >55yrs ago w/ ? RFCA @ [facility];  b. Recurrent 12/2013;  b. Rx Flecainide and Xarelto-->Intermittent compliance.   PVC's (premature ventricular contractions)    a. 05/2017 Zio: 4000 PVCs in 48 hrs (2%). Brief run of SVT.   Tobacco abuse    a. ongoing  - 1 ppd.    Patient Active Problem List   Diagnosis Date Noted   Alcoholic pancreatitis 12/07/2020   Non compliance w medication regimen 12/07/2020   Atrial fibrillation (HCC) 11/20/2020   Abscess of buccal space of mouth 11/19/2020   Dental abscess 11/19/2020   Facial cellulitis_left 11/19/2020   Cellulitis of face    Atrial fibrillation with RVR (HCC) 08/06/2020   Demand ischemia (HCC)    Polysubstance abuse (HCC)    Rapid atrial fibrillation (HCC) 08/05/2020   Alcohol use disorder, moderate, dependence (HCC) 08/05/2020   Cocaine abuse (HCC) 08/05/2020   Alcohol abuse 10/20/2019   Depression 10/20/2019   A-fib (HCC) 01/15/2017   Personal history of tobacco use, presenting hazards to health 08/17/2016   BPH associated with nocturia 07/30/2016   IFG (impaired fasting glucose) 07/30/2016   Chest pain 08/22/2015   Essential hypertension    PAD (peripheral artery disease) (HCC)    Drug-induced erectile dysfunction 01/22/2015   CAD (coronary artery disease) 04/11/2014   CAD in native artery 04/11/2014   Paroxysmal atrial fibrillation (HCC)    Tobacco abuse    Hyperlipidemia    Atrial fibrillation with rapid ventricular response (HCC) 01/27/2014    Past Surgical History:  Procedure Laterality Date   CARDIAC CATHETERIZATION     CARDIAC CATHETERIZATION     [facility]   LEFT HEART CATH Right 01/27/2014   Procedure: LEFT  HEART CATH;  Surgeon: Micheline Chapman, MD;  Location: [facility] CATH LAB;  Service: Cardiovascular;  Laterality: Right;    Prior to Admission medications   Medication Sig Start Date End Date Taking? Authorizing Provider  apixaban (ELIQUIS) 5 MG TABS tablet Take 1 tablet (5 mg total) by mouth 2 (two) times daily. 12/08/20 03/08/21 Yes Darlin Priestly, MD  diltiazem (CARDIZEM CD) 240 MG 24 hr capsule Take 1 capsule (240 mg total) by mouth daily. 12/08/20 03/08/21 Yes Darlin Priestly, MD  ibuprofen (ADVIL) 400 MG tablet Take 1 tablet (400 mg total) by mouth every 6 (six) hours as  needed. Patient taking differently: Take 400 mg by mouth every 6 (six) hours as needed for fever, headache or mild pain. 12/08/20  Yes Darlin Priestly, MD  metoprolol succinate (TOPROL-XL) 100 MG 24 hr tablet Take 1 tablet (100 mg total) by mouth daily. 12/08/20 03/08/21 Yes Darlin Priestly, MD  amoxicillin (AMOXIL) 500 MG capsule Take 500 mg by mouth as needed. Patient not taking: Reported on 01/30/2021    [provider]  thiamine 100 MG tablet Take 1 tablet (100 mg total) by mouth daily. Patient not taking: Reported on 01/30/2021 12/09/20   Darlin Priestly, MD    Allergies Patient has no known allergies.  Family History  Problem Relation Age of Onset   Coronary artery disease Father 42   Diabetes Mother    Diabetes Sister    Diabetes Brother    Diabetes Maternal Grandmother    Diabetes Sister    Diabetes Sister     Social History Social History   Tobacco Use   Smoking status: Every Day    Packs/day: 0.50    Years: 30.00    Pack years: 15.00    Types: Cigarettes   Smokeless tobacco: Never  Substance Use Topics   Alcohol use: Yes    Alcohol/week: 12.0 standard drinks    Types: 12 Cans of beer per week   Drug use: Yes    Types: Cocaine    Review of Systems Constitutional: No fever/chills Eyes: No visual changes. ENT: No sore throat. Cardiovascular: Endorses chest pain. Respiratory: Denies shortness of breath. Gastrointestinal: No abdominal pain.  No nausea, no vomiting.  No diarrhea. Genitourinary: Negative for dysuria. Musculoskeletal: Negative for acute arthralgias Skin: Negative for rash. Neurological: Negative for headaches, weakness/numbness/paresthesias in any extremity Psychiatric: Negative for suicidal ideation/homicidal ideation   ____________________________________________   PHYSICAL EXAM:  VITAL SIGNS: ED Triage Vitals  Enc Vitals Group     BP 01/30/21 0635 (!) 141/88     Pulse Rate 01/30/21 0631 99     Resp 01/30/21 0631 19     Temp 01/30/21 0631 97.9 F  (36.6 C)     Temp Source 01/30/21 0631 Oral     SpO2 01/30/21 0631 99 %     Weight 01/30/21 0631 165 lb 2 oz (74.9 kg)     Height --      Head Circumference --      Peak Flow --      Pain Score 01/30/21 0631 0     Pain Loc --      Pain Edu? --      Excl. in GC? --    Constitutional: Alert and oriented. Well appearing and in no acute distress. Eyes: Conjunctivae are normal. PERRL. Head: Atraumatic. Nose: No congestion/rhinnorhea. Mouth/Throat: Mucous membranes are moist. Neck: No stridor Cardiovascular: Grossly normal heart sounds.  Good peripheral circulation. Respiratory: Normal respiratory effort.  No retractions. Gastrointestinal: Soft  and nontender. No distention. Musculoskeletal: No obvious deformities Neurologic:  Normal speech and language. No gross focal neurologic deficits are appreciated. Skin:  Skin is warm and dry. No rash noted. Psychiatric: Mood and affect are normal. Speech and behavior are normal.  ____________________________________________   LABS (all labs ordered are listed, but only abnormal results are displayed)  Labs Reviewed  CBC - Abnormal; Notable for the following components:      Result Value   RDW 16.6 (*)    All other components within normal limits  BRAIN NATRIURETIC PEPTIDE - Abnormal; Notable for the following components:   B Natriuretic Peptide 252.8 (*)    All other components within normal limits  COMPREHENSIVE METABOLIC PANEL - Abnormal; Notable for the following components:   Calcium 8.8 (*)    All other components within normal limits  BASIC METABOLIC PANEL  TROPONIN I (HIGH SENSITIVITY)  TROPONIN I (HIGH SENSITIVITY)   ____________________________________________  EKG  ED ECG REPORT I, Merwyn Katos, the attending physician, personally viewed and interpreted this ECG.  Date: 01/30/2021 EKG Time: 0633 Rate: 105 Rhythm: Atrial fibrillation QRS Axis: normal Intervals: normal ST/T Wave abnormalities: normal Narrative  Interpretation: no evidence of acute ischemia   PROCEDURES  Procedure(s) performed (including Critical Care):  Procedures   ____________________________________________   INITIAL IMPRESSION / ASSESSMENT AND PLAN / ED COURSE  As part of my medical decision making, I reviewed the following data within the electronic medical record, if available:  Nursing notes reviewed and incorporated, Labs reviewed, EKG interpreted, Old chart reviewed, Radiograph reviewed and Notes from prior ED visits reviewed and incorporated        Patient is a 65 year old male who presents for chest pain and found to be in atrial fibrillation by EMS in route. Workup: ECG, CXR, CBC, BMP, Troponin Findings: ECG: No overt evidence of STEMI. No evidence of Brugadas sign, delta wave, epsilon wave, significantly prolonged QTc, or malignant arrhythmia HS Troponin: Negative x1 Other Labs unremarkable for emergent problems. CXR: Without PTX, PNA, or widened mediastinum Last Stress Test:  2016 Last Heart Catheterization:  2016 HEART Score: 4  Given History, Exam, and Workup I have low suspicion for ACS, Pneumothorax, Pneumonia, Pulmonary Embolus, Tamponade, Aortic Dissection or other emergent problem as a cause for this presentation.   Reassesment: Prior to discharge patients pain was controlled and they were well appearing.  Disposition: Care of this patient will be signed out to the oncoming physician at the end of my shift.  All pertinent patient information conveyed and all questions answered.  All further care and disposition decisions will be made by the oncoming physician.       ____________________________________________   FINAL CLINICAL IMPRESSION(S) / ED DIAGNOSES  Final diagnoses:  Atrial fibrillation, unspecified type (HCC)  Chest pain, unspecified type     ED Discharge Orders     None        Note:  This document was prepared using Dragon voice recognition software and may include  unintentional dictation errors.    Merwyn Katos, MD 02/05/21 1536

## 2021-01-30 NOTE — ED Triage Notes (Signed)
Pt to ED via EMS from home, pt called out for chest pain. Pt states pain was orignially 10/10. Pt took asa at home as well as his prescribed dilt and metoprolol around aprox 0200. Per ems pt was in afib rvr when they arrived, rate 110s-180s. Pt was given 5 of metoprolol and 500 ns by ems. On arrival to ED pt denies pain.

## 2021-01-30 NOTE — Discharge Instructions (Addendum)
Please follow-up with your cardiologist.  Return to the emergency department for any chest pain, shortness of breath, or any other symptom personally concerning to yourself.

## 2021-03-09 ENCOUNTER — Encounter: Payer: Self-pay | Admitting: Pulmonary Disease

## 2021-03-09 ENCOUNTER — Other Ambulatory Visit: Payer: Self-pay

## 2021-03-09 ENCOUNTER — Inpatient Hospital Stay
Admission: EM | Admit: 2021-03-09 | Discharge: 2021-03-12 | DRG: 308 | Disposition: A | Discharge status: Home / Self Care | Payer: Medicare Other | Attending: Internal Medicine | Admitting: Internal Medicine

## 2021-03-09 ENCOUNTER — Emergency Department: Payer: Medicare Other

## 2021-03-09 ENCOUNTER — Inpatient Hospital Stay: Payer: Medicare Other

## 2021-03-09 DIAGNOSIS — E785 Hyperlipidemia, unspecified: Secondary | ICD-10-CM | POA: Diagnosis present

## 2021-03-09 DIAGNOSIS — K852 Alcohol induced acute pancreatitis without necrosis or infection: Secondary | ICD-10-CM | POA: Diagnosis present

## 2021-03-09 DIAGNOSIS — I480 Paroxysmal atrial fibrillation: Secondary | ICD-10-CM | POA: Diagnosis not present

## 2021-03-09 DIAGNOSIS — R351 Nocturia: Secondary | ICD-10-CM | POA: Diagnosis present

## 2021-03-09 DIAGNOSIS — N179 Acute kidney failure, unspecified: Secondary | ICD-10-CM | POA: Diagnosis present

## 2021-03-09 DIAGNOSIS — N401 Enlarged prostate with lower urinary tract symptoms: Secondary | ICD-10-CM | POA: Diagnosis present

## 2021-03-09 DIAGNOSIS — Z7901 Long term (current) use of anticoagulants: Secondary | ICD-10-CM

## 2021-03-09 DIAGNOSIS — K219 Gastro-esophageal reflux disease without esophagitis: Secondary | ICD-10-CM | POA: Diagnosis present

## 2021-03-09 DIAGNOSIS — I708 Atherosclerosis of other arteries: Secondary | ICD-10-CM | POA: Diagnosis present

## 2021-03-09 DIAGNOSIS — R0789 Other chest pain: Secondary | ICD-10-CM | POA: Diagnosis present

## 2021-03-09 DIAGNOSIS — I429 Cardiomyopathy, unspecified: Secondary | ICD-10-CM | POA: Diagnosis present

## 2021-03-09 DIAGNOSIS — Y908 Blood alcohol level of 240 mg/100 ml or more: Secondary | ICD-10-CM | POA: Diagnosis present

## 2021-03-09 DIAGNOSIS — Z7141 Alcohol abuse counseling and surveillance of alcoholic: Secondary | ICD-10-CM

## 2021-03-09 DIAGNOSIS — Z9114 Patient's other noncompliance with medication regimen: Secondary | ICD-10-CM | POA: Diagnosis not present

## 2021-03-09 DIAGNOSIS — Z20822 Contact with and (suspected) exposure to covid-19: Secondary | ICD-10-CM | POA: Diagnosis present

## 2021-03-09 DIAGNOSIS — I4892 Unspecified atrial flutter: Secondary | ICD-10-CM

## 2021-03-09 DIAGNOSIS — F17210 Nicotine dependence, cigarettes, uncomplicated: Secondary | ICD-10-CM | POA: Diagnosis present

## 2021-03-09 DIAGNOSIS — R578 Other shock: Secondary | ICD-10-CM | POA: Diagnosis present

## 2021-03-09 DIAGNOSIS — R1011 Right upper quadrant pain: Secondary | ICD-10-CM | POA: Diagnosis not present

## 2021-03-09 DIAGNOSIS — I2510 Atherosclerotic heart disease of native coronary artery without angina pectoris: Secondary | ICD-10-CM | POA: Diagnosis present

## 2021-03-09 DIAGNOSIS — R001 Bradycardia, unspecified: Secondary | ICD-10-CM | POA: Diagnosis not present

## 2021-03-09 DIAGNOSIS — I739 Peripheral vascular disease, unspecified: Secondary | ICD-10-CM | POA: Diagnosis present

## 2021-03-09 DIAGNOSIS — I10 Essential (primary) hypertension: Secondary | ICD-10-CM | POA: Diagnosis not present

## 2021-03-09 DIAGNOSIS — K860 Alcohol-induced chronic pancreatitis: Secondary | ICD-10-CM | POA: Diagnosis present

## 2021-03-09 DIAGNOSIS — F10129 Alcohol abuse with intoxication, unspecified: Secondary | ICD-10-CM | POA: Diagnosis present

## 2021-03-09 DIAGNOSIS — I4891 Unspecified atrial fibrillation: Secondary | ICD-10-CM | POA: Diagnosis not present

## 2021-03-09 DIAGNOSIS — I4819 Other persistent atrial fibrillation: Principal | ICD-10-CM | POA: Diagnosis present

## 2021-03-09 DIAGNOSIS — F1410 Cocaine abuse, uncomplicated: Secondary | ICD-10-CM | POA: Diagnosis present

## 2021-03-09 DIAGNOSIS — E872 Acidosis, unspecified: Secondary | ICD-10-CM | POA: Diagnosis present

## 2021-03-09 DIAGNOSIS — Z8249 Family history of ischemic heart disease and other diseases of the circulatory system: Secondary | ICD-10-CM

## 2021-03-09 DIAGNOSIS — I959 Hypotension, unspecified: Secondary | ICD-10-CM | POA: Diagnosis present

## 2021-03-09 DIAGNOSIS — F1090 Alcohol use, unspecified, uncomplicated: Secondary | ICD-10-CM | POA: Diagnosis not present

## 2021-03-09 DIAGNOSIS — Z720 Tobacco use: Secondary | ICD-10-CM | POA: Diagnosis not present

## 2021-03-09 DIAGNOSIS — F1010 Alcohol abuse, uncomplicated: Secondary | ICD-10-CM | POA: Diagnosis present

## 2021-03-09 DIAGNOSIS — G9341 Metabolic encephalopathy: Secondary | ICD-10-CM | POA: Diagnosis present

## 2021-03-09 DIAGNOSIS — G9340 Encephalopathy, unspecified: Secondary | ICD-10-CM | POA: Diagnosis not present

## 2021-03-09 DIAGNOSIS — Z91199 Patient's noncompliance with other medical treatment and regimen due to unspecified reason: Secondary | ICD-10-CM

## 2021-03-09 DIAGNOSIS — I4811 Longstanding persistent atrial fibrillation: Secondary | ICD-10-CM | POA: Diagnosis not present

## 2021-03-09 DIAGNOSIS — F10929 Alcohol use, unspecified with intoxication, unspecified: Secondary | ICD-10-CM

## 2021-03-09 DIAGNOSIS — I119 Hypertensive heart disease without heart failure: Secondary | ICD-10-CM | POA: Diagnosis present

## 2021-03-09 DIAGNOSIS — Z79899 Other long term (current) drug therapy: Secondary | ICD-10-CM

## 2021-03-09 LAB — RESP PANEL BY RT-PCR (FLU A&B, COVID) ARPGX2
Influenza A by PCR: NEGATIVE
Influenza B by PCR: NEGATIVE
SARS Coronavirus 2 by RT PCR: NEGATIVE

## 2021-03-09 LAB — COMPREHENSIVE METABOLIC PANEL
ALT: 53 U/L — ABNORMAL HIGH (ref 0–44)
AST: 135 U/L — ABNORMAL HIGH (ref 15–41)
Albumin: 3.6 g/dL (ref 3.5–5.0)
Alkaline Phosphatase: 70 U/L (ref 38–126)
Anion gap: 11 (ref 5–15)
BUN: 8 mg/dL (ref 8–23)
CO2: 23 mmol/L (ref 22–32)
Calcium: 8.1 mg/dL — ABNORMAL LOW (ref 8.9–10.3)
Chloride: 103 mmol/L (ref 98–111)
Creatinine, Ser: 1.29 mg/dL — ABNORMAL HIGH (ref 0.61–1.24)
GFR, Estimated: >60 mL/min (ref >60)
Glucose, Bld: 108 mg/dL — ABNORMAL HIGH (ref 70–99)
Potassium: 3.8 mmol/L (ref 3.5–5.1)
Sodium: 137 mmol/L (ref 135–145)
Total Bilirubin: 0.5 mg/dL (ref 0.3–1.2)
Total Protein: 6 g/dL — ABNORMAL LOW (ref 6.5–8.1)

## 2021-03-09 LAB — TRIGLYCERIDES: Triglycerides: 145 mg/dL (ref <150)

## 2021-03-09 LAB — URINALYSIS, COMPLETE (UACMP) WITH MICROSCOPIC
Bilirubin Urine: NEGATIVE
Glucose, UA: 50 mg/dL — AB
Hgb urine dipstick: NEGATIVE
Ketones, ur: NEGATIVE mg/dL
Nitrite: NEGATIVE
Protein, ur: 30 mg/dL — AB
Specific Gravity, Urine: 1.017 (ref 1.005–1.030)
pH: 5 (ref 5.0–8.0)

## 2021-03-09 LAB — CBC
HCT: 38.4 % — ABNORMAL LOW (ref 39.0–52.0)
Hemoglobin: 13.8 g/dL (ref 13.0–17.0)
MCH: 34.3 pg — ABNORMAL HIGH (ref 26.0–34.0)
MCHC: 35.9 g/dL (ref 30.0–36.0)
MCV: 95.5 fL (ref 80.0–100.0)
Platelets: 163 10*3/uL (ref 150–400)
RBC: 4.02 MIL/uL — ABNORMAL LOW (ref 4.22–5.81)
RDW: 16.5 % — ABNORMAL HIGH (ref 11.5–15.5)
WBC: 5.9 10*3/uL (ref 4.0–10.5)
nRBC: 0 % (ref 0.0–0.2)

## 2021-03-09 LAB — MRSA NEXT GEN BY PCR, NASAL: MRSA by PCR Next Gen: NOT DETECTED

## 2021-03-09 LAB — PROCALCITONIN: Procalcitonin: <0.1 ng/mL

## 2021-03-09 LAB — PROTIME-INR
INR: 1.1 (ref 0.8–1.2)
Prothrombin Time: 14.1 seconds (ref 11.4–15.2)

## 2021-03-09 LAB — URINE DRUG SCREEN, QUALITATIVE (ARMC ONLY)
Amphetamines, Ur Screen: NOT DETECTED
Barbiturates, Ur Screen: NOT DETECTED
Benzodiazepine, Ur Scrn: NOT DETECTED
Cannabinoid 50 Ng, Ur ~~LOC~~: NOT DETECTED
Cocaine Metabolite,Ur ~~LOC~~: NOT DETECTED
MDMA (Ecstasy)Ur Screen: NOT DETECTED
Methadone Scn, Ur: NOT DETECTED
Opiate, Ur Screen: NOT DETECTED
Phencyclidine (PCP) Ur S: NOT DETECTED
Tricyclic, Ur Screen: NOT DETECTED

## 2021-03-09 LAB — LIPASE, BLOOD: Lipase: 87 U/L — ABNORMAL HIGH (ref 11–51)

## 2021-03-09 LAB — MAGNESIUM: Magnesium: 1.9 mg/dL (ref 1.7–2.4)

## 2021-03-09 LAB — LACTIC ACID, PLASMA
Lactic Acid, Venous: 3.7 mmol/L (ref 0.5–1.9)
Lactic Acid, Venous: 3.5 mmol/L (ref 0.5–1.9)
Lactic Acid, Venous: 1.3 mmol/L (ref 0.5–1.9)

## 2021-03-09 LAB — TROPONIN I (HIGH SENSITIVITY)
Troponin I (High Sensitivity): 13 ng/L (ref <18)
Troponin I (High Sensitivity): 10 ng/L (ref <18)

## 2021-03-09 LAB — GLUCOSE, CAPILLARY: Glucose-Capillary: 149 mg/dL — ABNORMAL HIGH (ref 70–99)

## 2021-03-09 LAB — ETHANOL: Alcohol, Ethyl (B): 265 mg/dL — ABNORMAL HIGH (ref <10)

## 2021-03-09 MED ORDER — PANTOPRAZOLE SODIUM 40 MG IV SOLR
40.0000 mg | INTRAVENOUS | Status: DC
  Administered 2021-03-09 – 2021-03-12 (×4): 40 mg via INTRAVENOUS
  Filled 2021-03-09 (×4): qty 40

## 2021-03-09 MED ORDER — DROPERIDOL 2.5 MG/ML IJ SOLN
2.5000 mg | Freq: Once | INTRAMUSCULAR | Status: AC
  Administered 2021-03-09: 2.5 mg via INTRAVENOUS
  Filled 2021-03-09: qty 2

## 2021-03-09 MED ORDER — DOCUSATE SODIUM 100 MG PO CAPS: 100.0000 mg | ORAL_CAPSULE | Freq: Two times a day (BID) | ORAL | Status: DC | PRN

## 2021-03-09 MED ORDER — APIXABAN 5 MG PO TABS
5.0000 mg | ORAL_TABLET | Freq: Two times a day (BID) | ORAL | Status: DC
  Administered 2021-03-10 – 2021-03-12 (×5): 5 mg via ORAL
  Filled 2021-03-09 (×5): qty 1

## 2021-03-09 MED ORDER — POLYETHYLENE GLYCOL 3350 17 G PO PACK: 17.0000 g | PACK | Freq: Every day | ORAL | Status: DC | PRN

## 2021-03-09 MED ORDER — DROPERIDOL 2.5 MG/ML IJ SOLN
5.0000 mg | Freq: Once | INTRAMUSCULAR | Status: DC
  Filled 2021-03-09: qty 2

## 2021-03-09 MED ORDER — TECHNETIUM TC 99M MEBROFENIN IV KIT
5.4700 | PACK | Freq: Once | INTRAVENOUS | Status: AC | PRN
  Administered 2021-03-09: 5.47 via INTRAVENOUS

## 2021-03-09 MED ORDER — LORAZEPAM 2 MG/ML IJ SOLN: 1.0000 mg | INTRAMUSCULAR | Status: DC | PRN

## 2021-03-09 MED ORDER — LORAZEPAM 1 MG PO TABS: 1.0000 mg | ORAL_TABLET | ORAL | Status: DC | PRN

## 2021-03-09 MED ORDER — ADULT MULTIVITAMIN W/MINERALS CH
1.0000 | ORAL_TABLET | Freq: Every day | ORAL | Status: DC
  Administered 2021-03-10 – 2021-03-12 (×3): 1 via ORAL
  Filled 2021-03-09 (×3): qty 1

## 2021-03-09 MED ORDER — EPINEPHRINE HCL 5 MG/250ML IV SOLN IN NS
0.5000 ug/min | INTRAVENOUS | Status: DC
  Administered 2021-03-09: 10 ug/min via INTRAVENOUS
  Filled 2021-03-09: qty 250

## 2021-03-09 MED ORDER — LORAZEPAM 2 MG PO TABS
0.0000 mg | ORAL_TABLET | ORAL | Status: AC
  Administered 2021-03-10 – 2021-03-11 (×5): 2 mg via ORAL
  Filled 2021-03-09 (×5): qty 1

## 2021-03-09 MED ORDER — SODIUM CHLORIDE 0.9 % IV BOLUS
500.0000 mL | Freq: Once | INTRAVENOUS | Status: AC
  Administered 2021-03-09: 500 mL via INTRAVENOUS

## 2021-03-09 MED ORDER — LORAZEPAM 2 MG PO TABS
0.0000 mg | ORAL_TABLET | Freq: Three times a day (TID) | ORAL | Status: DC
  Administered 2021-03-11 – 2021-03-12 (×2): 2 mg via ORAL
  Filled 2021-03-09 (×2): qty 1

## 2021-03-09 MED ORDER — THIAMINE HCL 100 MG/ML IJ SOLN: 100.0000 mg | Freq: Every day | INTRAMUSCULAR | Status: DC

## 2021-03-09 MED ORDER — IOHEXOL 350 MG/ML SOLN
100.0000 mL | Freq: Once | INTRAVENOUS | Status: AC | PRN
  Administered 2021-03-09: 100 mL via INTRAVENOUS

## 2021-03-09 MED ORDER — PIPERACILLIN-TAZOBACTAM 3.375 G IVPB 30 MIN
3.3750 g | Freq: Once | INTRAVENOUS | Status: AC
  Administered 2021-03-09: 3.375 g via INTRAVENOUS
  Filled 2021-03-09: qty 50

## 2021-03-09 MED ORDER — SODIUM CHLORIDE 0.9 % IV BOLUS
1000.0000 mL | Freq: Once | INTRAVENOUS | Status: AC
  Administered 2021-03-09: 1000 mL via INTRAVENOUS

## 2021-03-09 MED ORDER — PIPERACILLIN-TAZOBACTAM 3.375 G IVPB
3.3750 g | Freq: Three times a day (TID) | INTRAVENOUS | Status: DC
  Administered 2021-03-09: 3.375 g via INTRAVENOUS
  Filled 2021-03-09: qty 50

## 2021-03-09 MED ORDER — FOLIC ACID 1 MG PO TABS
1.0000 mg | ORAL_TABLET | Freq: Every day | ORAL | Status: DC
  Administered 2021-03-09 – 2021-03-10 (×2): 1 mg via ORAL
  Filled 2021-03-09 (×4): qty 1

## 2021-03-09 MED ORDER — ONDANSETRON HCL 4 MG/2ML IJ SOLN: 4.0000 mg | Freq: Four times a day (QID) | INTRAMUSCULAR | Status: DC | PRN

## 2021-03-09 MED ORDER — ATROPINE SULFATE 1 MG/10ML IJ SOSY
0.5000 mg | PREFILLED_SYRINGE | INTRAMUSCULAR | Status: AC | PRN
  Administered 2021-03-09 (×3): 0.5 mg via INTRAVENOUS
  Filled 2021-03-09 (×2): qty 10

## 2021-03-09 MED ORDER — CHLORHEXIDINE GLUCONATE CLOTH 2 % EX PADS
6.0000 | MEDICATED_PAD | Freq: Every day | CUTANEOUS | Status: DC
  Administered 2021-03-10: 6 via TOPICAL

## 2021-03-09 MED ORDER — LACTATED RINGERS IV BOLUS
1000.0000 mL | Freq: Once | INTRAVENOUS | Status: AC
  Administered 2021-03-09: 1000 mL via INTRAVENOUS

## 2021-03-09 MED ORDER — LORAZEPAM 2 MG/ML IJ SOLN
1.0000 mg | INTRAMUSCULAR | Status: DC | PRN
  Administered 2021-03-09: 1 mg via INTRAVENOUS
  Filled 2021-03-09: qty 1

## 2021-03-09 MED ORDER — THIAMINE HCL 100 MG/ML IJ SOLN
250.0000 mg | Freq: Every day | INTRAVENOUS | Status: DC
  Administered 2021-03-09 – 2021-03-12 (×4): 250 mg via INTRAVENOUS
  Filled 2021-03-09 (×5): qty 2.5

## 2021-03-09 NOTE — ED Provider Notes (Addendum)
[facility] Emergency Department Provider Note  ____________________________________________   Event Date/Time   First MD Initiated Contact with Patient 03/09/21 0128     (approximate)  I have reviewed the triage vital signs and the nursing notes.   HISTORY  Chief Complaint Chest Pain  Level 5 caveat:  history/ROS limited by acute intoxication  HPI Darren Rogers is a 65 y.o. male with extensive medical history as listed below which includes chronic A. fib/a flutter on Eliquis, at least some degree of cardiomyopathy, history of alcohol abuse, and history of cocaine abuse.  He presents tonight by EMS for chest pain.  He said that he drank "A LOT" of alcohol today.  He had generalized sharp and burning chest pain that started after he laid down.  However he said he also feels very weak all over and cold.  Nothing particular makes his symptoms better or worse and they are severe.  He denies weakness in any of his extremities, no numbness, and he has not had a recent fever nor shortness of breath.     Past Medical History:  Diagnosis Date   Claudication (HCC)    a. 06/2015 ABI: R - 0.73, L - 0.73. 30-49% bilat SFA stenosis. 50-74% R Profunda stenosis; b. 01/2017 ABI: R 0.89, L 0.88, TBI R 0.65, L 1.0.   Erectile dysfunction    Essential hypertension    History of echocardiogram    a. 03/29/2014: EF 55-60%, mild LVH, normal RVSP;  b. 05/2015 Echo: EF 60-65%, triv AI, mild MR; c. 07/2020 Echo:    Hyperlipidemia    Non-obstructive CAD    a. 01/27/2014 Cath: LM nl, LAD mild diff dz w/o obs, LCx no sig obs - scattered 20-30% mLCx, RCA no obs dz, EF 70%; b. 06/2015 MV; EF 60%, no ischemia.   PAF (paroxysmal atrial fibrillation) (HCC)    a. Pt says Dx >4yrs ago w/ ? RFCA @ [facility];  b. Recurrent 12/2013;  b. Rx Flecainide and Xarelto-->Intermittent compliance.   PVC's (premature ventricular contractions)    a. 05/2017 Zio: 4000 PVCs in 48 hrs (2%). Brief run of SVT.    Tobacco abuse    a. ongoing - 1 ppd.    Patient Active Problem List   Diagnosis Date Noted   Symptomatic bradycardia 03/09/2021   Alcoholic pancreatitis 12/07/2020   Non compliance w medication regimen 12/07/2020   Atrial fibrillation (HCC) 11/20/2020   Abscess of buccal space of mouth 11/19/2020   Dental abscess 11/19/2020   Facial cellulitis_left 11/19/2020   Cellulitis of face    Atrial fibrillation with RVR (HCC) 08/06/2020   Demand ischemia (HCC)    Polysubstance abuse (HCC)    Rapid atrial fibrillation (HCC) 08/05/2020   Alcohol use disorder, moderate, dependence (HCC) 08/05/2020   Cocaine abuse (HCC) 08/05/2020   Alcohol abuse 10/20/2019   Depression 10/20/2019   A-fib (HCC) 01/15/2017   Personal history of tobacco use, presenting hazards to health 08/17/2016   BPH associated with nocturia 07/30/2016   IFG (impaired fasting glucose) 07/30/2016   Chest pain 08/22/2015   Essential hypertension    PAD (peripheral artery disease) (HCC)    Drug-induced erectile dysfunction 01/22/2015   CAD (coronary artery disease) 04/11/2014   CAD in native artery 04/11/2014   Paroxysmal atrial fibrillation (HCC)    Tobacco abuse    Hyperlipidemia    Atrial fibrillation with rapid ventricular response (HCC) 01/27/2014    Past Surgical History:  Procedure Laterality Date   CARDIAC CATHETERIZATION  CARDIAC CATHETERIZATION     [facility]   LEFT HEART CATH Right 01/27/2014   Procedure: LEFT HEART CATH;  Surgeon: Micheline Chapman, MD;  Location: [facility] CATH LAB;  Service: Cardiovascular;  Laterality: Right;    Prior to Admission medications   Medication Sig Start Date End Date Taking? Authorizing Provider  amoxicillin (AMOXIL) 500 MG capsule Take 500 mg by mouth as needed. Patient not taking: Reported on 01/30/2021    [provider]  apixaban (ELIQUIS) 5 MG TABS tablet Take 1 tablet (5 mg total) by mouth 2 (two) times daily. 12/08/20 03/08/21  Darlin Priestly, MD  diltiazem (CARDIZEM CD) 240  MG 24 hr capsule Take 1 capsule (240 mg total) by mouth daily. 12/08/20 03/08/21  Darlin Priestly, MD  ibuprofen (ADVIL) 400 MG tablet Take 1 tablet (400 mg total) by mouth every 6 (six) hours as needed. Patient taking differently: Take 400 mg by mouth every 6 (six) hours as needed for fever, headache or mild pain. 12/08/20   Darlin Priestly, MD  metoprolol succinate (TOPROL-XL) 100 MG 24 hr tablet Take 1 tablet (100 mg total) by mouth daily. 12/08/20 03/08/21  Darlin Priestly, MD  thiamine 100 MG tablet Take 1 tablet (100 mg total) by mouth daily. Patient not taking: Reported on 01/30/2021 12/09/20   Darlin Priestly, MD    Allergies Patient has no known allergies.  Family History  Problem Relation Age of Onset   Coronary artery disease Father 5   Diabetes Mother    Diabetes Sister    Diabetes Brother    Diabetes Maternal Grandmother    Diabetes Sister    Diabetes Sister     Social History Social History   Tobacco Use   Smoking status: Every Day    Packs/day: 0.50    Years: 30.00    Pack years: 15.00    Types: Cigarettes   Smokeless tobacco: Never  Substance Use Topics   Alcohol use: Yes    Alcohol/week: 12.0 standard drinks    Types: 12 Cans of beer per week   Drug use: Yes    Types: Cocaine    Review of Systems Level 5 caveat:  history/ROS limited by acute intoxication   constitutional: No fever/chills.  Positive for general malaise and weakness. Eyes: No visual changes. ENT: No sore throat. Cardiovascular: Positive for chest pain. Respiratory: Denies shortness of breath. Gastrointestinal: No abdominal pain.  No nausea, no vomiting.  No diarrhea.  No constipation. Genitourinary: Negative for dysuria. Musculoskeletal: Negative for neck pain.  Negative for back pain. Integumentary: Negative for rash. Neurological: Negative for headaches, focal weakness or numbness.   ____________________________________________   PHYSICAL EXAM:  VITAL SIGNS: ED Triage Vitals  Enc Vitals Group     BP  03/09/21 0129 (!) 73/57     Pulse Rate 03/09/21 0128 (!) 44     Resp 03/09/21 0127 12     Temp 03/09/21 0130 97.9 F (36.6 C)     Temp Source 03/09/21 0129 Oral     SpO2 03/09/21 0129 95 %     Weight 03/09/21 0127 70.8 kg (156 lb)     Height 03/09/21 0127 1.676 m (5\' 6" )     Head Circumference --      Peak Flow --      Pain Score 03/09/21 0127 8     Pain Loc --      Pain Edu? --      Excl. in GC? --     Constitutional: Alert and oriented.  Appears slightly intoxicated but is very communicative and able to answer questions and provide history. Eyes: Conjunctivae are normal.  Head: Atraumatic. Nose: No congestion/rhinnorhea. Mouth/Throat: Patient is wearing a mask. Neck: No stridor.  No meningeal signs.   Cardiovascular: Normal rate, regular rhythm. Good peripheral circulation. Respiratory: Normal respiratory effort.  No retractions. Gastrointestinal: Soft and nondistended.  Tender to palpation throughout the abdomen without any localized tenderness.  No rebound but some guarding. Musculoskeletal: No lower extremity tenderness nor edema. No gross deformities of extremities. Neurologic:  Normal speech and language. No gross focal neurologic deficits are appreciated.  Skin:  Skin is warm, dry and intact. Psychiatric: Mood and affect are normal. Speech and behavior are normal.  ____________________________________________   LABS (all labs ordered are listed, but only abnormal results are displayed)  Labs Reviewed  CBC - Abnormal; Notable for the following components:      Result Value   RBC 4.02 (*)    HCT 38.4 (*)    MCH 34.3 (*)    RDW 16.5 (*)    All other components within normal limits  COMPREHENSIVE METABOLIC PANEL - Abnormal; Notable for the following components:   Glucose, Bld 108 (*)    Creatinine, Ser 1.29 (*)    Calcium 8.1 (*)    Total Protein 6.0 (*)    AST 135 (*)    ALT 53 (*)    All other components within normal limits  ETHANOL - Abnormal; Notable for  the following components:   Alcohol, Ethyl (B) 265 (*)    All other components within normal limits  LIPASE, BLOOD - Abnormal; Notable for the following components:   Lipase 87 (*)    All other components within normal limits  LACTIC ACID, PLASMA - Abnormal; Notable for the following components:   Lactic Acid, Venous 3.7 (*)    All other components within normal limits  GLUCOSE, CAPILLARY - Abnormal; Notable for the following components:   Glucose-Capillary 149 (*)    All other components within normal limits  URINALYSIS, COMPLETE (UACMP) WITH MICROSCOPIC - Abnormal; Notable for the following components:   Color, Urine YELLOW (*)    APPearance HAZY (*)    Glucose, UA 50 (*)    Protein, ur 30 (*)    Leukocytes,Ua TRACE (*)    Bacteria, UA RARE (*)    All other components within normal limits  RESP PANEL BY RT-PCR (FLU A&B, COVID) ARPGX2  CULTURE, BLOOD (ROUTINE X 2)  MRSA NEXT GEN BY PCR, NASAL  PROTIME-INR  URINE DRUG SCREEN, QUALITATIVE ([facility] ONLY)  MAGNESIUM  PROCALCITONIN  TRIGLYCERIDES  LACTIC ACID, PLASMA  TROPONIN I (HIGH SENSITIVITY)  TROPONIN I (HIGH SENSITIVITY)   ____________________________________________  EKG  ED ECG REPORT #1 I, Loleta Rose, the attending physician, personally viewed and interpreted this ECG.  Date: 03/09/2021 EKG Time: 1:30 AM Rate: 41 Rhythm: Marked bradycardia with underlying a flutter QRS Axis: normal Intervals: A flutter with nonspecific intraventricular conduction delay ST/T Wave abnormalities: Non-specific ST segment / T-wave changes, but no clear evidence of acute ischemia. Narrative Interpretation: no definitive evidence of acute ischemia; does not meet STEMI criteria.  ED ECG REPORT #2 I, Loleta Rose, the attending physician, personally viewed and interpreted this ECG.  Date: 03/09/2021 EKG Time: 1:32 AM Rate: 43 Rhythm: Bradycardia with underlying atrial flutter. QRS Axis: normal Intervals: Nonspecific  intraventricular conduction delay, question junctional rhythm ST/T Wave abnormalities: Non-specific ST segment / T-wave changes, but no clear evidence of acute ischemia. Narrative Interpretation: no  definitive evidence of acute ischemia; does not meet STEMI criteria.   ____________________________________________  RADIOLOGY I, Loleta Rose, personally viewed and evaluated these images (plain radiographs) as part of my medical decision making, as well as reviewing the written report by the radiologist.  ED MD interpretation: No acute abnormalities on chest x-ray  Official radiology report(s): DG Chest Port 1 View  Result Date: 03/09/2021 CLINICAL DATA:  Chest pain EXAM: PORTABLE CHEST 1 VIEW COMPARISON:  01/30/2021 FINDINGS: The heart size and mediastinal contours are within normal limits. Both lungs are clear. The visualized skeletal structures are unremarkable. IMPRESSION: No active disease. Electronically Signed   By: Deatra Robinson M.D.   On: 03/09/2021 01:47   CT Angio Chest/Abd/Pel for Dissection W and/or W/WO  Result Date: 03/09/2021 CLINICAL DATA:  Thoracic aortic aneurysm suspected. Abdominal pain. Aortic dissection suspected. Chest pain. Abdominal pain. Alcohol and cocaine use. Chronic atrial fibrillation on Eliquis. Now with bradycardia and hypotension. EXAM: CT ANGIOGRAPHY CHEST, ABDOMEN AND PELVIS TECHNIQUE: Non-contrast CT of the chest was initially obtained. Multidetector CT imaging through the chest, abdomen and pelvis was performed using the standard protocol during bolus administration of intravenous contrast. Multiplanar reconstructed images and MIPs were obtained and reviewed to evaluate the vascular anatomy. CONTRAST:  OMNIPAQUE IOHEXOL 350 MG/ML SOLN COMPARISON:  CT chest screening study 08/17/2016 FINDINGS: CTA CHEST FINDINGS Cardiovascular: Unenhanced images demonstrate scattered calcification of the aorta and coronary arteries. No evidence of intramural hematoma.  Images obtained during arterial phase after administration of intravenous contrast material demonstrate normal caliber thoracic aorta. No aneurysm or dissection. Fluid around the aortic root is likely in the pericardial reflection. Normal heart size. No pericardial effusions. Mediastinum/Nodes: Esophagus is decompressed. Thyroid gland is unremarkable. No significant lymphadenopathy. Lungs/Pleura: Visualization is limited due to artifact but there is no obvious consolidation or edema. Probable dependent changes in the lung bases. No pleural effusions. No obvious pneumothorax. There is evidence of debris in the trachea, likely mucous secretions. Musculoskeletal: No chest wall abnormality. No acute or significant osseous findings. Review of the MIP images confirms the above findings. CTA ABDOMEN AND PELVIS FINDINGS VASCULAR Aorta: Diffuse aortic calcification with mild mural thrombus. The aorta remains patent without evidence of dissection or aneurysm. Celiac: Patent without evidence of aneurysm, dissection, vasculitis or significant stenosis. SMA: Patent without evidence of aneurysm, dissection, vasculitis or significant stenosis. Renals: Calcification at the origin of the renal arteries without obvious occlusion. Nephrograms are symmetrical. No aneurysm. IMA: Patent without evidence of aneurysm, dissection, vasculitis or significant stenosis. Inflow: Diffuse calcific and noncalcific changes throughout the common iliac and external iliac arteries bilaterally. The vessels remain patent although there are likely areas of significant stenosis. Veins: No obvious venous abnormality within the limitations of this arterial phase study. Review of the MIP images confirms the above findings. NON-VASCULAR Hepatobiliary: No focal liver lesions identified. The gallbladder is somewhat contracted but demonstrates diffuse wall thickening and edema, likely due to cholecystitis. This could be confirmed at ultrasound if clinically  indicated. No bile duct dilatation. Pancreas: Visualization is technically limited but there appears to be mild edema and stranding around the pancreas particularly at the head. This could represent reactive inflammation related to the cholecystitis or it could indicate mild pancreatitis. No loculated collection. Spleen: Spleen is atrophic. Adrenals/Urinary Tract: Adrenal glands are unremarkable. Kidneys are normal, without renal calculi, focal lesion, or hydronephrosis. Bladder is decompressed. Stomach/Bowel: Stomach is filled with fluid and ingested material. No obvious wall thickening. Small bowel and colon are mostly decompressed. Scattered  stool is in the colon. Appendix is normal. Lymphatic: No significant lymphadenopathy. Reproductive: Prostate gland is not enlarged. Other: No free air or free fluid in the abdomen. Abdominal wall musculature appears intact. Musculoskeletal: No acute or significant osseous findings. Review of the MIP images confirms the above findings. IMPRESSION: 1. No evidence of aneurysm or dissection involving the thoracic or abdominal aorta. 2. Extensive aortoiliac atherosclerosis. Probable areas of stenosis in the iliac and external iliac arteries bilaterally. 3. No evidence of active pulmonary disease. 4. Gallbladder wall thickening and edema likely indicating acute cholecystitis. Mild stranding around the pancreas may be reactive or may indicate early acute pancreatitis. 5. Examination is technically limited due to patient's body habitus, motion, and patient positioning. Electronically Signed   By: Burman Nieves M.D.   On: 03/09/2021 03:30    ____________________________________________   PROCEDURES   Procedure(s) performed (including Critical Care):  .Critical Care Performed by: Loleta Rose, MD Authorized by: Loleta Rose, MD   Critical care provider statement:    Critical care time (minutes):  60   Critical care time was exclusive of:  Separately billable  procedures and treating other patients   Critical care was necessary to treat or prevent imminent or life-threatening deterioration of the following conditions:  Circulatory failure and cardiac failure   Critical care was time spent personally by me on the following activities:  Development of treatment plan with patient or surrogate, discussions with consultants, evaluation of patient's response to treatment, examination of patient, obtaining history from patient or surrogate, ordering and performing treatments and interventions, ordering and review of laboratory studies, ordering and review of radiographic studies, pulse oximetry, re-evaluation of patient's condition and review of old charts .1-3 Lead EKG Interpretation Performed by: Loleta Rose, MD Authorized by: Loleta Rose, MD     Interpretation: abnormal     ECG rate:  39   ECG rate assessment: bradycardic     Rhythm: atrial flutter     Ectopy: none     ____________________________________________   INITIAL IMPRESSION / MDM / ASSESSMENT AND PLAN / ED COURSE  As part of my medical decision making, I reviewed the following data within the electronic MEDICAL RECORD NUMBER History obtained from family, Nursing notes reviewed and incorporated, Labs reviewed , EKG interpreted , Old chart reviewed, Radiograph reviewed , A consult was requested and obtained from this/these consultant(s) Critical care, and reviewed Notes from prior ED visits   Differential diagnosis includes, but is not limited to, acid reflux, pancreatitis, substance-induced mood disorder, AAS, ACS, heart block, medication or drug side effect.  The patient is on the cardiac monitor to evaluate for evidence of arrhythmia and/or significant heart rate changes.  The patient is hypotensive around 70/50 giving him a map of around 57-60.  He is also profoundly bradycardic with a heart rate around 39-40 which is atypical for him.  He was tender to palpation of the abdomen in general  but without any localized tenderness but he is mostly endorsing chest pain but said it is worse when he lies down flat.  He has a history of polysubstance abuse.  He also admits to being acutely intoxicated.  He received 500 mL of normal saline by EMS and I ordered another 500 mL.  He does have a history of cardiomyopathy but with preserved ejection fraction and he needs the fluids at this point.  EKG was interpreted by the machine as complete heart block but he has underlying rhythm of atrial flutter so I do not think  we can establish the presence or absence of heart block.  However it is possible to say that he has symptomatic bradycardia that I will need to treat.  See hospital course for details.  Patient is protecting his airway and does not require emergent intubation.     Clinical Course as of 03/09/21 0753  Wynelle Link Mar 09, 2021  0156 The patient is arguably unstable with a blood pressure around 70/50 and a heart rate right around 40.  This is a new process; he seems to be in a flutter which is essentially normal for him but looking back through multiple old EKGs I cannot see any time that he was bradycardic.  The patient states he feels very weak.  He has having intermittent chest pain but he states that it feels like his indigestion.  He reported to me that he took his diltiazem and his metoprolol about 2 hours ago after his chest pain started.  He also admitted that he last used crack cocaine about 2 days ago and that his chest discomfort started after that time. [CF]  0157 I ordered atropine 0.5 mg IV every 3 to 5 minutes as needed and he received a full 3 doses.  This is brought his heart rate up to 60 but his blood pressure continues to be low with a MAP of 63.  He is getting an IV fluid bolus I will continue to monitor but I anticipate he may need to start on a low-dose of either epinephrine or possibly dopamine.  I am going to see what his lab results show to see if he has any indication of  cardiac injury with a high troponin or of kidney failure before I start a pressor. [CF]  0218 Blood pressure has improved to 82/62 which is a MAP of 69-70.  Heart rate improved after atropine and has been maintaining at about 60.  There is no way the patient will tolerate transcutaneous pacing; he is already irritated by the pulse oximeter and cardiac leads and keeps trying to take them off. [CF]  0219 CBC is within normal limits.  Alcohol level is 265.  Lipase is slightly elevated at 87.  However this is very similar to the last lipase level and it is difficult to know if this represents chronic disease or acute pancreatitis. [CF]  0220   CMP is essentially normal with a very slightly elevated creatinine of 1.29 which is not far from his baseline of around 1.12.  He has mild elevation of his AST and ALT which is to be expected in an alcoholic. [CF]  0222 DG Chest [facility] I personally reviewed the patient's imaging and agree with the radiologist's interpretation that there are no acute abnormalities on his checks x-ray.  I think that most likely he is having some hypotension and bradycardia due to taking his diltiazem and metoprolol about 2 hours ago.  However there is a possibility that he is suffering from AAS, and he has enough comorbidities including polysubstance abuse and acute intoxication and that it is very difficult to rule out a potentially life-threatening condition.  I will obtain a CTA chest/abdomen/pelvis to rule out acute aortic disease as well as to further evaluate his pancreas. [CF]  0306 Magnesium: 1.9 [CF]  0321 Patient remains agitated and difficult to redirect, most likely due to the alcohol intoxication as well as his discomfort.  He also remains hypertensive although his heart rate has improved to around 60.  I am contacting the  provider in the ICU to discuss chronotropes and admission.  I am awaiting the results of the CTA.  I am also giving another dose of droperidol 2.5 mg IV  for his agitation so that we can better monitor him. [CF]  [phone] I discussed the case by phone with Cheryll Cockayne Rust-Chester, ICU NP, and we discussed additional management at this point.  She agreed with me that an epinephrine infusion is appropriate for him.  We both agreed that high-dose insulin or other aggressive treatment for possible beta-blocker or calcium blocker overdose is unnecessary and potentially risky for him.  She will come evaluate the patient in the emergency department and admit him to the intensive care unit.  I ordered an epinephrine infusion with titratable parameters for maintaining MAP and heart rate. [CF]  0349 CT scan is suggestive of acute cholecystitis.  Patient is not stable for the OR but I am calling Dr. Aleen Campi him aware.  I ordered Zosyn 3.375 g IV.  Patient is still being admitted to the ICU service. [CF]  0408 Discussed case by phone with Dr. Aleen Campi.  He reviewed the imaging and the details of the case.  He agrees with the plan for Zosyn and admission to the ICU.  He said no surgical intervention would happen until the patient has been stabilized and cleared by anesthesiology.  He recommended an ultrasound and/or HIDA scan when possible to obtain based on the patient's stability and tolerating the procedures.  I discussed this in person with Cheryll Cockayne Rust-Chester, ICU NP, and she agrees with the plan. [CF]    Clinical Course User Index [CF] Loleta Rose, MD     ____________________________________________  FINAL CLINICAL IMPRESSION(S) / ED DIAGNOSES  Final diagnoses:  Symptomatic bradycardia  Atrial flutter, unspecified type (HCC)  Current use of long term anticoagulation  Hypotension, unspecified hypotension type  Cocaine abuse (HCC)  Alcoholic intoxication with complication (HCC)     MEDICATIONS GIVEN DURING THIS VISIT:  Medications  EPINEPHrine (ADRENALIN) 5 mg in NS 250 mL (0.02 mg/mL) premix infusion (6 mcg/min Intravenous Rate/Dose Change  03/09/21 0445)  apixaban (ELIQUIS) tablet 5 mg (has no administration in time range)  docusate sodium (COLACE) capsule 100 mg (has no administration in time range)  polyethylene glycol (MIRALAX / GLYCOLAX) packet 17 g (has no administration in time range)  pantoprazole (PROTONIX) injection 40 mg (has no administration in time range)  ondansetron (ZOFRAN) injection 4 mg (has no administration in time range)  thiamine (B-1) 250 mg in sodium chloride 0.9 % 50 mL IVPB (has no administration in time range)    Followed by  thiamine (B-1) injection 100 mg (has no administration in time range)  folic acid (FOLVITE) tablet 1 mg (has no administration in time range)  sodium chloride 0.9 % bolus 500 mL (0 mLs Intravenous Stopped 03/09/21 0228)  atropine 1 MG/10ML injection 0.5 mg (0.5 mg Intravenous Given 03/09/21 0155)  iohexol (OMNIPAQUE) 350 MG/ML injection 100 mL (100 mLs Intravenous Contrast Given 03/09/21 0240)  droperidol (INAPSINE) 2.5 MG/ML injection 2.5 mg (2.5 mg Intravenous Given 03/09/21 0230)  lactated ringers bolus 1,000 mL (0 mLs Intravenous Stopped 03/09/21 0428)  droperidol (INAPSINE) 2.5 MG/ML injection 2.5 mg (2.5 mg Intravenous Given 03/09/21 0328)  piperacillin-tazobactam (ZOSYN) IVPB 3.375 g (0 g Intravenous Stopped 03/09/21 0608)  sodium chloride 0.9 % bolus 1,000 mL (1,000 mLs Intravenous New Bag/Given 03/09/21 0411)  sodium chloride 0.9 % bolus 1,000 mL (1,000 mLs Intravenous New Bag/Given 03/09/21 0619)  ED Discharge Orders     None        Note:  This document was prepared using Dragon voice recognition software and may include unintentional dictation errors.   Loleta Rose, MD 03/09/21 [id number]    Loleta Rose, MD 03/09/21 [phone]

## 2021-03-09 NOTE — Consult Note (Signed)
Date of Consultation:  03/09/2021  Requesting Physician:  Loleta Rose, MD  Reason for Consultation:  Cholecystitis  History of Present Illness: Darren Rogers is a 65 y.o. male who was admitted overnight to the ICU with abdominal pain, bradycardia, and hypotension.  The patient was intoxicated after drinking four 40 ounce beers, and her alcohol level of 265.  He has a history of atrial fibrillation but does not appear to take his Eliquis or other medications scheduled as indicated.  On initial work-up in the emergency room, he had laboratory findings showing a creatinine of 1.29, total bilirubin 0.5, AST 135, ALT 53, alkaline phosphatase 70, and lipase 87.  His white blood cell count was 5.9 with a procalcitonin of less than 0.1.  However he had a lactic acid of 3.7.  He had a CTA of the chest abdomen and pelvis concerning for possible dissection which was negative for that but did show a somewhat contracted and thickened gallbladder with edema.  His pancreas also had mild inflammatory changes at the head of the pancreas.  However there were no gallstones noted in his common bile duct was of normal size.  He was admitted to the ICU due to his bradycardia and hypotension and was started on epinephrine infusion.    This morning, he is well awake and able to have a normal conversation as he is now sober.  He denies any abdominal pain at this point.  Denies any nausea or vomiting either.  Past Medical History: Past Medical History:  Diagnosis Date   Claudication (HCC)    a. 06/2015 ABI: R - 0.73, L - 0.73. 30-49% bilat SFA stenosis. 50-74% R Profunda stenosis; b. 01/2017 ABI: R 0.89, L 0.88, TBI R 0.65, L 1.0.   Erectile dysfunction    Essential hypertension    History of echocardiogram    a. 03/29/2014: EF 55-60%, mild LVH, normal RVSP;  b. 05/2015 Echo: EF 60-65%, triv AI, mild MR; c. 07/2020 Echo:    Hyperlipidemia    Non-obstructive CAD    a. 01/27/2014 Cath: LM nl, LAD mild diff dz w/o obs, LCx no  sig obs - scattered 20-30% mLCx, RCA no obs dz, EF 70%; b. 06/2015 MV; EF 60%, no ischemia.   PAF (paroxysmal atrial fibrillation) (HCC)    a. Pt says Dx >52yrs ago w/ ? RFCA @ [facility];  b. Recurrent 12/2013;  b. Rx Flecainide and Xarelto-->Intermittent compliance.   PVC's (premature ventricular contractions)    a. 05/2017 Zio: 4000 PVCs in 48 hrs (2%). Brief run of SVT.   Tobacco abuse    a. ongoing - 1 ppd.     Past Surgical History: Past Surgical History:  Procedure Laterality Date   CARDIAC CATHETERIZATION     CARDIAC CATHETERIZATION     [facility]   LEFT HEART CATH Right 01/27/2014   Procedure: LEFT HEART CATH;  Surgeon: Micheline Chapman, MD;  Location: [facility] CATH LAB;  Service: Cardiovascular;  Laterality: Right;    Home Medications: Prior to Admission medications   Medication Sig Start Date End Date Taking? Authorizing Provider  amoxicillin (AMOXIL) 500 MG capsule Take 500 mg by mouth as needed. Patient not taking: Reported on 01/30/2021    [provider]  apixaban (ELIQUIS) 5 MG TABS tablet Take 1 tablet (5 mg total) by mouth 2 (two) times daily. 12/08/20 03/08/21  Darlin Priestly, MD  diltiazem (CARDIZEM CD) 240 MG 24 hr capsule Take 1 capsule (240 mg total) by mouth daily. 12/08/20 03/08/21  Fran Lowes,  Inetta Fermo, MD  ibuprofen (ADVIL) 400 MG tablet Take 1 tablet (400 mg total) by mouth every 6 (six) hours as needed. Patient taking differently: Take 400 mg by mouth every 6 (six) hours as needed for fever, headache or mild pain. 12/08/20   Darlin Priestly, MD  metoprolol succinate (TOPROL-XL) 100 MG 24 hr tablet Take 1 tablet (100 mg total) by mouth daily. 12/08/20 03/08/21  Darlin Priestly, MD  thiamine 100 MG tablet Take 1 tablet (100 mg total) by mouth daily. Patient not taking: Reported on 01/30/2021 12/09/20   Darlin Priestly, MD    Allergies: No Known Allergies  Social History:  reports that he has been smoking cigarettes. He has a 15.00 pack-year smoking history. He has never used smokeless tobacco. He reports  current alcohol use of about 12.0 standard drinks per week. He reports current drug use. Drug: Cocaine.   Family History: Family History  Problem Relation Age of Onset   Coronary artery disease Father 39   Diabetes Mother    Diabetes Sister    Diabetes Brother    Diabetes Maternal Grandmother    Diabetes Sister    Diabetes Sister     Review of Systems: Review of Systems  Constitutional:  Negative for chills and fever.  Respiratory:  Negative for shortness of breath.   Cardiovascular:  Negative for chest pain.  Gastrointestinal:  Negative for abdominal pain, nausea and vomiting.  Genitourinary:  Negative for dysuria.  Musculoskeletal:  Negative for myalgias.  Skin:  Negative for rash.  Neurological:  Negative for dizziness.  Psychiatric/Behavioral:  Negative for depression.    Physical Exam BP 115/81   Pulse 76   Temp 97.7 F (36.5 C) (Oral)   Resp 16   Ht 5\' 6"  (1.676 m)   Wt 70.8 kg   SpO2 (!) 88%   BMI 25.18 kg/m  CONSTITUTIONAL: No acute distress HEENT:  Normocephalic, atraumatic, extraocular motion intact. NECK: Trachea is midline, and there is no jugular venous distension. RESPIRATORY:  Normal respiratory effort without pathologic use of accessory muscles. CARDIOVASCULAR: Currently on epinephrine, with decreasing doses.  However his blood pressure is now within normal and his heart rate is within normal. GI: The abdomen is soft, nondistended, with no tenderness to palpation except perhaps some mild discomfort in the lower abdomen.  Negative Murphy's sign.  MUSCULOSKELETAL:  Normal muscle strength and tone in all four extremities.  No peripheral edema or cyanosis. SKIN: Skin turgor is normal. There are no pathologic skin lesions.  NEUROLOGIC:  Motor and sensation is grossly normal.  Cranial nerves are grossly intact. PSYCH:  Alert and oriented to person, place and time. Affect is normal.  Laboratory Analysis: Results for orders placed or performed during the  hospital encounter of 03/09/21 (from the past 24 hour(s))  CBC     Status: Abnormal   Collection Time: 03/09/21  1:32 AM  Result Value Ref Range   WBC 5.9 4.0 - 10.5 K/uL   RBC 4.02 (L) 4.22 - 5.81 MIL/uL   Hemoglobin 13.8 13.0 - 17.0 g/dL   HCT 05/09/21 (L) 01.0 - 93.2 %   MCV 95.5 80.0 - 100.0 fL   MCH 34.3 (H) 26.0 - 34.0 pg   MCHC 35.9 30.0 - 36.0 g/dL   RDW 35.5 (H) 73.2 - 20.2 %   Platelets 163 150 - 400 K/uL   nRBC 0.0 0.0 - 0.2 %  Troponin I (High Sensitivity)     Status: None   Collection Time: 03/09/21  1:32 AM  Result Value Ref Range   Troponin I (High Sensitivity) 13 <18 ng/L  Comprehensive metabolic panel     Status: Abnormal   Collection Time: 03/09/21  1:32 AM  Result Value Ref Range   Sodium 137 135 - 145 mmol/L   Potassium 3.8 3.5 - 5.1 mmol/L   Chloride 103 98 - 111 mmol/L   CO2 23 22 - 32 mmol/L   Glucose, Bld 108 (H) 70 - 99 mg/dL   BUN 8 8 - 23 mg/dL   Creatinine, Ser 0.25 (H) 0.61 - 1.24 mg/dL   Calcium 8.1 (L) 8.9 - 10.3 mg/dL   Total Protein 6.0 (L) 6.5 - 8.1 g/dL   Albumin 3.6 3.5 - 5.0 g/dL   AST 427 (H) 15 - 41 U/L   ALT 53 (H) 0 - 44 U/L   Alkaline Phosphatase 70 38 - 126 U/L   Total Bilirubin 0.5 0.3 - 1.2 mg/dL   GFR, Estimated >06 >23 mL/min   Anion gap 11 5 - 15  Protime-INR     Status: None   Collection Time: 03/09/21  1:32 AM  Result Value Ref Range   Prothrombin Time 14.1 11.4 - 15.2 seconds   INR 1.1 0.8 - 1.2  Ethanol     Status: Abnormal   Collection Time: 03/09/21  1:32 AM  Result Value Ref Range   Alcohol, Ethyl (B) 265 (H) <10 mg/dL  Lipase, blood     Status: Abnormal   Collection Time: 03/09/21  1:32 AM  Result Value Ref Range   Lipase 87 (H) 11 - 51 U/L  Magnesium     Status: None   Collection Time: 03/09/21  1:32 AM  Result Value Ref Range   Magnesium 1.9 1.7 - 2.4 mg/dL  Procalcitonin     Status: None   Collection Time: 03/09/21  1:32 AM  Result Value Ref Range   Procalcitonin <0.10 ng/mL  Resp Panel by RT-PCR (Flu  A&B, Covid) Nasopharyngeal Swab     Status: None   Collection Time: 03/09/21  1:51 AM   Specimen: Nasopharyngeal Swab; Nasopharyngeal(NP) swabs in vial transport medium  Result Value Ref Range   SARS Coronavirus 2 by RT PCR NEGATIVE NEGATIVE   Influenza A by PCR NEGATIVE NEGATIVE   Influenza B by PCR NEGATIVE NEGATIVE  Troponin I (High Sensitivity)     Status: None   Collection Time: 03/09/21  4:23 AM  Result Value Ref Range   Troponin I (High Sensitivity) 10 <18 ng/L  Lactic acid, plasma     Status: Abnormal   Collection Time: 03/09/21  4:23 AM  Result Value Ref Range   Lactic Acid, Venous 3.7 (HH) 0.5 - 1.9 mmol/L  Triglycerides     Status: None   Collection Time: 03/09/21  4:23 AM  Result Value Ref Range   Triglycerides 145 <150 mg/dL  Glucose, capillary     Status: Abnormal   Collection Time: 03/09/21  5:07 AM  Result Value Ref Range   Glucose-Capillary 149 (H) 70 - 99 mg/dL  MRSA Next Gen by PCR, Nasal     Status: None   Collection Time: 03/09/21  6:00 AM   Specimen: Nasal Mucosa; Nasal Swab  Result Value Ref Range   MRSA by PCR Next Gen NOT DETECTED NOT DETECTED  Urine Drug Screen, Qualitative ([facility] only)     Status: None   Collection Time: 03/09/21  6:30 AM  Result Value Ref Range   Tricyclic, Ur Screen NONE DETECTED NONE DETECTED  Amphetamines, Ur Screen NONE DETECTED NONE DETECTED   MDMA (Ecstasy)Ur Screen NONE DETECTED NONE DETECTED   Cocaine Metabolite,Ur [place] NONE DETECTED NONE DETECTED   Opiate, Ur Screen NONE DETECTED NONE DETECTED   Phencyclidine (PCP) Ur S NONE DETECTED NONE DETECTED   Cannabinoid 50 Ng, Ur [place] NONE DETECTED NONE DETECTED   Barbiturates, Ur Screen NONE DETECTED NONE DETECTED   Benzodiazepine, Ur Scrn NONE DETECTED NONE DETECTED   Methadone Scn, Ur NONE DETECTED NONE DETECTED  Urinalysis, Complete w Microscopic     Status: Abnormal   Collection Time: 03/09/21  6:30 AM  Result Value Ref Range   Color, Urine YELLOW (A) YELLOW   APPearance HAZY  (A) CLEAR   Specific Gravity, Urine 1.017 1.005 - 1.030   pH 5.0 5.0 - 8.0   Glucose, UA 50 (A) NEGATIVE mg/dL   Hgb urine dipstick NEGATIVE NEGATIVE   Bilirubin Urine NEGATIVE NEGATIVE   Ketones, ur NEGATIVE NEGATIVE mg/dL   Protein, ur 30 (A) NEGATIVE mg/dL   Nitrite NEGATIVE NEGATIVE   Leukocytes,Ua TRACE (A) NEGATIVE   RBC / HPF 0-5 0 - 5 RBC/hpf   WBC, UA 0-5 0 - 5 WBC/hpf   Bacteria, UA RARE (A) NONE SEEN   Squamous Epithelial / LPF 0-5 0 - 5   Mucus PRESENT    Hyaline Casts, UA PRESENT   Lactic acid, plasma     Status: Abnormal   Collection Time: 03/09/21  7:41 AM  Result Value Ref Range   Lactic Acid, Venous 3.5 (HH) 0.5 - 1.9 mmol/L    Imaging: DG Chest Port 1 View  Result Date: 03/09/2021 CLINICAL DATA:  Chest pain EXAM: PORTABLE CHEST 1 VIEW COMPARISON:  01/30/2021 FINDINGS: The heart size and mediastinal contours are within normal limits. Both lungs are clear. The visualized skeletal structures are unremarkable. IMPRESSION: No active disease. Electronically Signed   By: Deatra Robinson M.D.   On: 03/09/2021 01:47   CT Angio Chest/Abd/Pel for Dissection W and/or W/WO  Result Date: 03/09/2021 CLINICAL DATA:  Thoracic aortic aneurysm suspected. Abdominal pain. Aortic dissection suspected. Chest pain. Abdominal pain. Alcohol and cocaine use. Chronic atrial fibrillation on Eliquis. Now with bradycardia and hypotension. EXAM: CT ANGIOGRAPHY CHEST, ABDOMEN AND PELVIS TECHNIQUE: Non-contrast CT of the chest was initially obtained. Multidetector CT imaging through the chest, abdomen and pelvis was performed using the standard protocol during bolus administration of intravenous contrast. Multiplanar reconstructed images and MIPs were obtained and reviewed to evaluate the vascular anatomy. CONTRAST:  OMNIPAQUE IOHEXOL 350 MG/ML SOLN COMPARISON:  CT chest screening study 08/17/2016 FINDINGS: CTA CHEST FINDINGS Cardiovascular: Unenhanced images demonstrate scattered calcification of  the aorta and coronary arteries. No evidence of intramural hematoma. Images obtained during arterial phase after administration of intravenous contrast material demonstrate normal caliber thoracic aorta. No aneurysm or dissection. Fluid around the aortic root is likely in the pericardial reflection. Normal heart size. No pericardial effusions. Mediastinum/Nodes: Esophagus is decompressed. Thyroid gland is unremarkable. No significant lymphadenopathy. Lungs/Pleura: Visualization is limited due to artifact but there is no obvious consolidation or edema. Probable dependent changes in the lung bases. No pleural effusions. No obvious pneumothorax. There is evidence of debris in the trachea, likely mucous secretions. Musculoskeletal: No chest wall abnormality. No acute or significant osseous findings. Review of the MIP images confirms the above findings. CTA ABDOMEN AND PELVIS FINDINGS VASCULAR Aorta: Diffuse aortic calcification with mild mural thrombus. The aorta remains patent without evidence of dissection or aneurysm. Celiac: Patent without evidence of aneurysm,  dissection, vasculitis or significant stenosis. SMA: Patent without evidence of aneurysm, dissection, vasculitis or significant stenosis. Renals: Calcification at the origin of the renal arteries without obvious occlusion. Nephrograms are symmetrical. No aneurysm. IMA: Patent without evidence of aneurysm, dissection, vasculitis or significant stenosis. Inflow: Diffuse calcific and noncalcific changes throughout the common iliac and external iliac arteries bilaterally. The vessels remain patent although there are likely areas of significant stenosis. Veins: No obvious venous abnormality within the limitations of this arterial phase study. Review of the MIP images confirms the above findings. NON-VASCULAR Hepatobiliary: No focal liver lesions identified. The gallbladder is somewhat contracted but demonstrates diffuse wall thickening and edema, likely due to  cholecystitis. This could be confirmed at ultrasound if clinically indicated. No bile duct dilatation. Pancreas: Visualization is technically limited but there appears to be mild edema and stranding around the pancreas particularly at the head. This could represent reactive inflammation related to the cholecystitis or it could indicate mild pancreatitis. No loculated collection. Spleen: Spleen is atrophic. Adrenals/Urinary Tract: Adrenal glands are unremarkable. Kidneys are normal, without renal calculi, focal lesion, or hydronephrosis. Bladder is decompressed. Stomach/Bowel: Stomach is filled with fluid and ingested material. No obvious wall thickening. Small bowel and colon are mostly decompressed. Scattered stool is in the colon. Appendix is normal. Lymphatic: No significant lymphadenopathy. Reproductive: Prostate gland is not enlarged. Other: No free air or free fluid in the abdomen. Abdominal wall musculature appears intact. Musculoskeletal: No acute or significant osseous findings. Review of the MIP images confirms the above findings. IMPRESSION: 1. No evidence of aneurysm or dissection involving the thoracic or abdominal aorta. 2. Extensive aortoiliac atherosclerosis. Probable areas of stenosis in the iliac and external iliac arteries bilaterally. 3. No evidence of active pulmonary disease. 4. Gallbladder wall thickening and edema likely indicating acute cholecystitis. Mild stranding around the pancreas may be reactive or may indicate early acute pancreatitis. 5. Examination is technically limited due to patient's body habitus, motion, and patient positioning. Electronically Signed   By: Burman Nieves M.D.   On: 03/09/2021 03:30   US Abdomen Limited RUQ (LIVER/GB)  Result Date: 03/09/2021 CLINICAL DATA:  Right upper quadrant abdomen pain for 2 days. EXAM: ULTRASOUND ABDOMEN LIMITED RIGHT UPPER QUADRANT COMPARISON:  None. FINDINGS: Gallbladder: No gallstones. The gallbladder wall measures 5 mm. Small  amount of pericholecystic fluid is identified. No sonographic Murphy sign noted by sonographer. Common bile duct: Diameter: 2 mm Liver: No focal lesion identified. there is diffuse increased echotexture of liver. Portal vein is patent on color Doppler imaging with normal direction of blood flow towards the liver. Other: None. IMPRESSION: Gallbladder wall thickening with pericholecystic fluid. No sonographic Eulah Pont sign is reported. The findings are equivocal for acute cholecystitis. Clinical correlation is recommended. Fatty infiltration of liver. Electronically Signed   By: Sherian Rein M.D.   On: 03/09/2021 10:53    Assessment and Plan: This is a 66 y.o. male presenting with bradycardia hypotension and findings concerning for possible cholecystitis.  - I personally viewed the patient's images and agree that there is gallbladder wall thickening.  Ultrasound shows the same but there is no gallstones.  On exam this morning, the patient is overall asymptomatic and his exam is very benign.  I think given the equivocal findings, I will order a HIDA scan to rule in or rule out cholecystitis in this setting.  This could have just been pancreatitis but a HIDA scan will help [place] confirm. - In the meantime, please keep the patient n.p.o. and hold  the patient's Eliquis in case he does have cholecystitis and require surgery.  Also for now he is starting to decrease the amount of epinephrine needed.  May potentially need cardiology involvement prior to surgery.  Face-to-face time spent with the patient and care providers was 80 minutes, with more than 50% of the time spent counseling, educating, and coordinating care of the patient.     Howie Ill, MD [facility]:  [phone]

## 2021-03-09 NOTE — Plan of Care (Signed)
Reviewed plan of care with patient, will require additional review.

## 2021-03-09 NOTE — Progress Notes (Signed)
03/09/21  HIDA scan completed and images personally viewed.  There is visualization of the gallbladder confirming patency of cystic duct.  Contrast also goes down to the small intestine confirming patency of common bile duct.  No evidence of cholecystitis.  No surgical intervention is needed.  Diet resumed.  Surgery will sign off for now.  Henrene Dodge, MD

## 2021-03-09 NOTE — H&P (Signed)
NAME:  Darren Rogers, MRN:  [id number], DOB:  Feb 01, 1956, LOS: 0 ADMISSION DATE:  03/09/2021, CONSULTATION DATE:  03/09/2021 REFERRING MD:  Dr. York Cerise, CHIEF COMPLAINT: Chest pain  History of Present Illness:  65 year old male presenting to [facility] ED from home via EMS on 03/09/2021 with complaints of chest pain.  Per ED documentation he reported generalized sharp and burning chest pain that started after he tried to lay down and go to sleep after drinking " a lot" of alcohol.  He also reports feeling very weak and cold all over with no aggravating or alleviating factors.  He denied weakness numbness, as well as no fever or shortness of breath. ED course: Patient was hypotensive on arrival with SBP in the 70s and profoundly bradycardic with heart rate 39 to 40 bpm.  He complained of generalized abdominal tenderness to light palpation.  Patient reported drinking "more alcohol than usual" which she clarified to be 4-40 ounce beers.  He admits to smoking a pack a day since about age 67.  But denies any other substance use in the last few days including cocaine & marijuana.  He reports the chest pain he experienced upon lying down has happened before and in the past this has been alleviated by taking his metoprolol and diltiazem. He reported that last night (Saturday, 03/08/2021) & Friday night (03/07/2021) he felt chest pain once he laid down to go to sleep.  On Friday night & Saturday night at 9:30 PM he reports taking his metoprolol and diltiazem and Eliquis.  He denies taking additional doses of metoprolol and diltiazem during the day even though he knows that's when it is prescribed.  He reports having some nausea earlier on Saturday, 03/08/2021 with dry heaves but no actual vomiting.  He also admitted this is unusual for him. He denies fevers/chills, vomiting/diarrhea, productive cough/shortness of breath. Medications given: Atropine 0.5 mg x 3, droperidol 2.5 mg x 2, 500 mL IVF.  Epinephrine drip initiated due  to hypotension in the setting of symptomatic bradycardia. Initial Vitals: Afebrile 97.9, bradycardic in A. fib at 39, RR 17, hypotensive at 73/59 & SPO2 93% on room air Significant labs: (Labs/ Imaging personally reviewed) I, Cheryll Cockayne Rust-Chester, AGACNP-BC, personally viewed and interpreted this ECG. EKG Interpretation: Date: 03/09/2021, EKG Time: 1:30, Rate: 41, Rhythm: Atrial fibrillation, QRS Axis: Normal, Intervals: No PR in A. fib, ST/T Wave abnormalities: None, Narrative Interpretation: Atrial fibrillation and bradycardia Chemistry: Na+: 137, K+: 3.8, BUN/Cr.:  8/1.29, Serum CO2/ AG: 23/11 Hematology: WBC: 5.9, Hgb: 13.8 Troponin: 13 > 10, Lactic/ PCT: 3.7/ < 0.10, COVID-19 & Influenza A/B: Negative Alcohol level: 265  CXR 03/09/2021: No active disease CT angio chest/abdomen/pelvis: Gallbladder wall thickening and edema likely indicating acute cholecystitis.  Mild stranding around the pancreas may be reactive or may indicate acute early pancreatitis.  Extensive aortoiliac atherosclerosis.  Probable areas of stenosis in the iliac and external iliac arteries bilaterally.  PCCM consulted for admission due to hypotension requiring epinephrine drip.  Pertinent  Medical History  Non-obstructive CAD Paroxsymal Atrial Fibrillation on Eliquis Tobacco abuse Polysubstance abuse  HLD HTN ETOH abuse Significant Hospital Events: Including procedures, antibiotic start and stop dates in addition to other pertinent events   03/09/21: Patient admitted on epinephrine drip due to symptomatic bradycardia & suspected acute cholecystitis.  Interim History / Subjective:  Patient drowsy and irritable but responsive & redirectable. Main current complaint is dry mouth, but swats and yells with light palpation of abdomen. + murphy's sign  Objective  Blood pressure 105/65, pulse 81, temperature 97.7 F (36.5 C), temperature source Oral, resp. rate 16, height 5\' 6"  (1.676 m), weight 70.8 kg, SpO2 96 %.         Intake/Output Summary (Last 24 hours) at 03/09/2021 05/09/2021 Last data filed at 03/09/2021 0600 Gross per 24 hour  Intake 2017.62 ml  Output --  Net 2017.62 ml   Filed Weights   03/09/21 0127  Weight: 70.8 kg    Examination: General: Adult male, acutely ill, lying in bed- restless but- NAD HEENT: MM pink/dry, anicteric, atraumatic, neck supple Neuro: drowsy, A&O x 4, able to follow commands, PERRL +3, MAE CV: s1s2 irregular, atrial fibrillation on monitor, no r/m/g Pulm: Regular, non labored on room air, breath sounds clear-BUL & diminished-BLL GI: distended, generalized tenderness to light palpation R > L, + Murphy's sign,bs x 4 Skin: no rashes/lesions noted Extremities: warm/dry, pulses + 2 R/P, no edema noted  Resolved Hospital Problem list     Assessment & Plan:  Symptomatic Bradycardia secondary to BB & CCB ingestion  Paroxsymal Atrial Fibrillation PMHx: non-obstructive CAD, HTN, HLD, polysubstance abuse Patient reports taking prescribed amount this evening without double dosing.  However per patient's own admission he was acutely intoxicated this evening, so there is the possibility he could have taken more than prescribed.  Per chart review patient has had multiple visits to [facility] ED for A. fib RVR, will have to titrate epi drip very carefully in order to avoid A. fib RVR - epinephrine drip, wean as tolerated to maintain SBP > 90 & MAP > 65 - Continue outpatient Eliquis - consider atropine/ TC pacing if HR falls < 35 bpm  - continuous cardiac monitoring - UDS pending - hold outpatient medications: diltiazem, metoprolol -consider restarting as patient stabilizes  Acute Cholecystitis Lactic acidosis without SIRS CT angiogram showing possible acute cholecystitis.  ED[facility] consulted Dr. York Cerise with general surgery who recommended follow-up RUQ ultrasound versus HIDA scan and we will follow-up with patient in the a.m. - RUQ ultrasound ordered, HIDA scan would have  to wait till a.m. > will discuss with day team - Supplemental oxygen as needed, to maintain SpO2 > 90% - f/u cultures, trend lactic/ PCT - Daily CBC - monitor WBC/ fever curve - IV antibiotics: zosyn  - IVF hydration as needed: ordered 30 mL/kg 2 L of NS IV bolus - Continue epinephrine drip to maintain MAP< 65, see above - general surgery consulted, appreciate input  Mild acute Kidney Injury secondary to circulatory shock in the setting of symptomatic bradycardia and acute cholecystitis Baseline Cr: 1.0, Cr on admission:1.29 - Strict I/O's: alert provider if UOP < 0.5 mL/kg/hr - gentle IVF hydration  - Daily BMP, replace electrolytes PRN - Avoid nephrotoxic agents as able, ensure adequate renal perfusion  Acute on Chronic? Pancreatitis in the setting of ETOH [place] Classification: mild, Ranson's score: 0, BISAP score:1 - Trend pancreatic enzymes & hepatic function - triglycerides pending - Continue antibiotic coverage: Zosyn, see above - aggressive IVF resuscitation, trend as needed based off labs - no c/o pain except with palpation and patient acutely intoxicated, add pain management PRN - NPO, with ice chips  Acute Encephalopathy in the setting of ETOH abuse - folic acid, multi-vitamin & thiamine daily - initiate CIWA protocol once patient has sobered - Falls precautions - supportive care  Best Practice (right click and "Reselect all SmartList Selections" daily)  Diet/type: NPO w/ oral meds DVT prophylaxis: DOAC GI prophylaxis: PPI Lines: N/A Foley:  N/A Code Status:  full code Last date of multidisciplinary goals of care discussion [03/09/21]  Labs   CBC: Recent Labs  Lab 03/09/21 0132  WBC 5.9  HGB 13.8  HCT 38.4*  MCV 95.5  PLT 163    Basic Metabolic Panel: Recent Labs  Lab 03/09/21 0132  NA 137  K 3.8  CL 103  CO2 23  GLUCOSE 108*  BUN 8  CREATININE 1.29*  CALCIUM 8.1*  MG 1.9   GFR: Estimated Creatinine Clearance: 51.5 mL/min (A) (by C-G  formula based on SCr of 1.29 mg/dL (H)). Recent Labs  Lab 03/09/21 0132 03/09/21 0423  PROCALCITON <0.10  --   WBC 5.9  --   LATICACIDVEN  --  3.7*    Liver Function Tests: Recent Labs  Lab 03/09/21 0132  AST 135*  ALT 53*  ALKPHOS 70  BILITOT 0.5  PROT 6.0*  ALBUMIN 3.6   Recent Labs  Lab 03/09/21 0132  LIPASE 87*   No results for input(s): AMMONIA in the last 168 hours.  ABG    Component Value Date/Time   TCO2 23 01/27/2014 1548     Coagulation Profile: Recent Labs  Lab 03/09/21 0132  INR 1.1    Cardiac Enzymes: No results for input(s): CKTOTAL, CKMB, CKMBINDEX, TROPONINI in the last 168 hours.  HbA1C: Hemoglobin A1C  Date/Time Value Ref Range Status  03/28/2014 11:28 AM 6.4 (H) 4.2 - 6.3 % Final    Comment:    The American Diabetes Association recommends that a primary goal of therapy should be <7% and that physicians should reevaluate the treatment regimen in patients with HbA1c values consistently >8%.    HB A1C (BAYER DCA - WAIVED)  Date/Time Value Ref Range Status  07/30/2016 04:00 PM 6.1 <7.0 % Final    Comment:                                          Diabetic Adult            <7.0                                       Healthy Adult        4.3 - 5.7                                                           (DCCT/NGSP) American Diabetes Association's Summary of Glycemic Recommendations for Adults with Diabetes: Hemoglobin A1c <7.0%. More stringent glycemic goals (A1c <6.0%) may further reduce complications at the cost of increased risk of hypoglycemia.    Hgb A1c MFr Bld  Date/Time Value Ref Range Status  08/22/2015 08:27 AM 6.0 4.0 - 6.0 % Final  01/27/2014 05:05 PM 6.3 (H) <5.7 % Final    Comment:    (NOTE)  According to the ADA Clinical Practice Recommendations for 2011, when HbA1c is used as a screening test:  >=6.5%   Diagnostic of Diabetes Mellitus            (if abnormal result is confirmed) 5.7-6.4%   Increased risk of developing Diabetes Mellitus References:Diagnosis and Classification of Diabetes Mellitus,Diabetes Care,2011,34(Suppl 1):S62-S69 and Standards of Medical Care in         Diabetes - 2011,Diabetes Care,2011,34 (Suppl 1):S11-S61.    CBG: Recent Labs  Lab 03/09/21 0507  GLUCAP 149*    Review of Systems: Positives in BOLD  Gen: Denies fever, chills, weight change, fatigue, night sweats HEENT: Denies blurred vision, double vision, hearing loss, tinnitus, sinus congestion, rhinorrhea, sore throat, neck stiffness, dysphagia PULM: Denies shortness of breath, cough, sputum production, hemoptysis, wheezing CV: Denies chest pain on arrival to ED, edema, orthopnea, paroxysmal nocturnal dyspnea, palpitations GI: Denies abdominal pain, nausea, vomiting, diarrhea, hematochezia, melena, constipation, change in bowel habits GU: Denies dysuria, hematuria, polyuria, oliguria, urethral discharge Endocrine: Denies hot or cold intolerance, polyuria, polyphagia or appetite change Derm: Denies rash, dry skin, scaling or peeling skin change Heme: Denies easy bruising, bleeding, bleeding gums Neuro: Denies headache, numbness, weakness, slurred speech, loss of memory or consciousness Past Medical History:  He,  has a past medical history of Claudication (HCC), Erectile dysfunction, Essential hypertension, History of echocardiogram, Hyperlipidemia, Non-obstructive CAD, PAF (paroxysmal atrial fibrillation) (HCC), PVC's (premature ventricular contractions), and Tobacco abuse.   Surgical History:   Past Surgical History:  Procedure Laterality Date   CARDIAC CATHETERIZATION     CARDIAC CATHETERIZATION     [place]   LEFT HEART CATH Right 01/27/2014   Procedure: LEFT HEART CATH;  Surgeon: Micheline Chapman, MD;  Location: [facility] CATH LAB;  Service: Cardiovascular;  Laterality: Right;     Social History:   reports that he has been smoking cigarettes. He has a  15.00 pack-year smoking history. He has never used smokeless tobacco. He reports current alcohol use of about 12.0 standard drinks per week. He reports current drug use. Drug: Cocaine.   Family History:  His family history includes Coronary artery disease (age of onset: 31) in his father; Diabetes in his brother, maternal grandmother, mother, sister, sister, and sister.   Allergies No Known Allergies   Home Medications  Prior to Admission medications   Medication Sig Start Date End Date Taking? Authorizing Provider  amoxicillin (AMOXIL) 500 MG capsule Take 500 mg by mouth as needed. Patient not taking: Reported on 01/30/2021    [provider]  apixaban (ELIQUIS) 5 MG TABS tablet Take 1 tablet (5 mg total) by mouth 2 (two) times daily. 12/08/20 03/08/21  Darlin Priestly, MD  diltiazem (CARDIZEM CD) 240 MG 24 hr capsule Take 1 capsule (240 mg total) by mouth daily. 12/08/20 03/08/21  Darlin Priestly, MD  ibuprofen (ADVIL) 400 MG tablet Take 1 tablet (400 mg total) by mouth every 6 (six) hours as needed. Patient taking differently: Take 400 mg by mouth every 6 (six) hours as needed for fever, headache or mild pain. 12/08/20   Darlin Priestly, MD  metoprolol succinate (TOPROL-XL) 100 MG 24 hr tablet Take 1 tablet (100 mg total) by mouth daily. 12/08/20 03/08/21  Darlin Priestly, MD  thiamine 100 MG tablet Take 1 tablet (100 mg total) by mouth daily. Patient not taking: Reported on 01/30/2021 12/09/20   Darlin Priestly, MD     Critical care time: 65 minutes       Cheryll Cockayne Rust-Chester, AGACNP-BC  Acute Care Nurse Practitioner [facility]   [phone] / [phone] Please see Amion for pager details.

## 2021-03-09 NOTE — ED Notes (Signed)
Adult cardiac Zoll Pads applied to patient's chest and back per order

## 2021-03-09 NOTE — ED Triage Notes (Signed)
Pt with etoh and chest pain. Pt received 324mg  asa from ems and 500 ml ns bolus. Pt is on eliquis. Md at bedside.

## 2021-03-09 NOTE — ED Notes (Signed)
This RN called to bedside by MD for pt bradycardia. Atropine administered x3 per orders and per MD verbal order. See MAR.

## 2021-03-09 NOTE — Plan of Care (Signed)
Neuro: alert and oriented, moves well independently in bed Resp: stable on room air CV: afebrile, vitals signs stable, remains in afib GIGU: advanced diet to PO following results of HIDA scan, voiding in urinal, no BM, abd pain decreasing Skin:clean dry and intact Social: updates given to 2 sisters, all questions and concerns addressed  Events: [place] abd and HIDA scan today  Problem: Education: Goal: Knowledge of General Education information will improve Description: Including pain rating scale, medication(s)/side effects and non-pharmacologic comfort measures Outcome: Progressing   Problem: Health Behavior/Discharge Planning: Goal: Ability to manage health-related needs will improve Outcome: Progressing   Problem: Clinical Measurements: Goal: Ability to maintain clinical measurements within normal limits will improve Outcome: Progressing Goal: Will remain free from infection Outcome: Progressing Goal: Diagnostic test results will improve Outcome: Progressing Goal: Respiratory complications will improve Outcome: Progressing Goal: Cardiovascular complication will be avoided Outcome: Progressing   Problem: Activity: Goal: Risk for activity intolerance will decrease Outcome: Progressing   Problem: Nutrition: Goal: Adequate nutrition will be maintained Outcome: Progressing   Problem: Coping: Goal: Level of anxiety will decrease Outcome: Progressing   Problem: Elimination: Goal: Will not experience complications related to bowel motility Outcome: Progressing Goal: Will not experience complications related to urinary retention Outcome: Progressing   Problem: Pain Managment: Goal: General experience of comfort will improve Outcome: Progressing   Problem: Safety: Goal: Ability to remain free from injury will improve Outcome: Progressing   Problem: Skin Integrity: Goal: Risk for impaired skin integrity will decrease Outcome: Progressing

## 2021-03-10 DIAGNOSIS — I10 Essential (primary) hypertension: Secondary | ICD-10-CM

## 2021-03-10 DIAGNOSIS — E785 Hyperlipidemia, unspecified: Secondary | ICD-10-CM | POA: Diagnosis not present

## 2021-03-10 DIAGNOSIS — I4811 Longstanding persistent atrial fibrillation: Secondary | ICD-10-CM | POA: Diagnosis not present

## 2021-03-10 DIAGNOSIS — N179 Acute kidney failure, unspecified: Secondary | ICD-10-CM

## 2021-03-10 DIAGNOSIS — G9340 Encephalopathy, unspecified: Secondary | ICD-10-CM

## 2021-03-10 DIAGNOSIS — F1090 Alcohol use, unspecified, uncomplicated: Secondary | ICD-10-CM

## 2021-03-10 DIAGNOSIS — Z720 Tobacco use: Secondary | ICD-10-CM

## 2021-03-10 DIAGNOSIS — R001 Bradycardia, unspecified: Secondary | ICD-10-CM

## 2021-03-10 DIAGNOSIS — R0789 Other chest pain: Secondary | ICD-10-CM | POA: Diagnosis not present

## 2021-03-10 DIAGNOSIS — I480 Paroxysmal atrial fibrillation: Secondary | ICD-10-CM

## 2021-03-10 DIAGNOSIS — E872 Acidosis, unspecified: Secondary | ICD-10-CM

## 2021-03-10 LAB — COMPREHENSIVE METABOLIC PANEL
ALT: 217 U/L — ABNORMAL HIGH (ref 0–44)
AST: 283 U/L — ABNORMAL HIGH (ref 15–41)
Albumin: 3.8 g/dL (ref 3.5–5.0)
Alkaline Phosphatase: 80 U/L (ref 38–126)
Anion gap: 7 (ref 5–15)
BUN: 9 mg/dL (ref 8–23)
CO2: 25 mmol/L (ref 22–32)
Calcium: 9 mg/dL (ref 8.9–10.3)
Chloride: 105 mmol/L (ref 98–111)
Creatinine, Ser: 1.08 mg/dL (ref 0.61–1.24)
GFR, Estimated: >60 mL/min (ref >60)
Glucose, Bld: 92 mg/dL (ref 70–99)
Potassium: 4 mmol/L (ref 3.5–5.1)
Sodium: 137 mmol/L (ref 135–145)
Total Bilirubin: 1 mg/dL (ref 0.3–1.2)
Total Protein: 7 g/dL (ref 6.5–8.1)

## 2021-03-10 LAB — CBC
HCT: 37.7 % — ABNORMAL LOW (ref 39.0–52.0)
Hemoglobin: 13.7 g/dL (ref 13.0–17.0)
MCH: 33.7 pg (ref 26.0–34.0)
MCHC: 36.3 g/dL — ABNORMAL HIGH (ref 30.0–36.0)
MCV: 92.9 fL (ref 80.0–100.0)
Platelets: 216 10*3/uL (ref 150–400)
RBC: 4.06 MIL/uL — ABNORMAL LOW (ref 4.22–5.81)
RDW: 16.2 % — ABNORMAL HIGH (ref 11.5–15.5)
WBC: 5.5 10*3/uL (ref 4.0–10.5)
nRBC: 0 % (ref 0.0–0.2)

## 2021-03-10 LAB — AMYLASE: Amylase: 61 U/L (ref 28–100)

## 2021-03-10 LAB — LIPASE, BLOOD: Lipase: 35 U/L (ref 11–51)

## 2021-03-10 LAB — PHOSPHORUS: Phosphorus: 2.9 mg/dL (ref 2.5–4.6)

## 2021-03-10 LAB — TSH: TSH: 1.556 u[IU]/mL (ref 0.350–4.500)

## 2021-03-10 LAB — MAGNESIUM: Magnesium: 1.9 mg/dL (ref 1.7–2.4)

## 2021-03-10 LAB — PROCALCITONIN: Procalcitonin: 0.24 ng/mL

## 2021-03-10 MED ORDER — METOPROLOL TARTRATE 50 MG PO TABS
50.0000 mg | ORAL_TABLET | Freq: Two times a day (BID) | ORAL | Status: DC
  Administered 2021-03-10 (×2): 50 mg via ORAL
  Filled 2021-03-10 (×2): qty 1

## 2021-03-10 NOTE — Consult Note (Signed)
PHARMACY CONSULT NOTE - FOLLOW UP  Pharmacy Consult for Electrolyte Monitoring and Replacement   Recent Labs: Potassium (mmol/L)  Date Value  03/10/2021 4.0  09/21/2014 3.5   Magnesium (mg/dL)  Date Value  16/03/9603 1.9  09/21/2014 1.8   Calcium (mg/dL)  Date Value  54/01/8118 9.0   Calcium, Total (mg/dL)  Date Value  14/78/2956 8.9   Albumin (g/dL)  Date Value  21/30/8657 3.8  11/04/2017 4.1  05/12/2014 3.4   Phosphorus (mg/dL)  Date Value  84/69/6295 2.9   Sodium (mmol/L)  Date Value  03/10/2021 137  08/04/2017 144  09/21/2014 139     Assessment: 64yo Male w/ h/o AFib/AFlutter (on Eliquis; intermittent compliance), component of CMP, history of EtOH & cocaine abuse. Presents with chest pain and EtOH intoxication with abd'l pain c/b bradycardia/hypoTN. Pharmacy consulted for electrolyte mgmt.  Goal of Therapy:  Lytes WNL  Plan:  No repletion at this time.  Lytes all WNL today and slight AKI is resolving.  Given h/o EtOH abuse would anticipate potential refeeding risk.  BMP, Mag, Phos ordered for AM labs.  Martyn Malay ,PharmD, [facility] Clinical Pharmacist 03/10/2021 8:14 AM

## 2021-03-10 NOTE — Progress Notes (Signed)
PROGRESS NOTE    Darren Rogers  QBH:[id number] DOB: 1955/12/20 DOA: 03/09/2021 PCP: Patient, No Pcp Per (Inactive)    Brief Narrative:  Darren Rogers is a 65 year old male with history of chronic atrial fibrillation, chronic alcohol abuse, history of recreational drug abuse, coronary artery disease, essential hypertension, peripheral vascular disease, came to emergency room complaining of chest pain.  Upon arriving the hospital, he was found to have significant bradycardia and hypotension. Upon arriving the hospital, patient was started on epinephrine drip to maintain heart rate and blood pressure. Condition has improved 10/10, transferred to progressive unit. Patient also had right upper quadrant ultrasound, HIDA scan, seen by surgery, cholecystitis is ruled out.   Assessment & Plan:   Active Problems:   Paroxysmal atrial fibrillation (HCC)   Alcohol abuse   Alcoholic pancreatitis   Symptomatic bradycardia   Abdominal pain, RUQ   AKI (acute kidney injury) (HCC)   Lactic acidosis   Acute encephalopathy  Chest pain. Paroxysmal atrial fibrillation with bradycardia, now with rapid ventricle response. Coronary artery disease. Patient had a history of polydrug abuse, urine drug screen was negative at this admission. Chest pain localized in the middle sternum, lasted about 30 minutes.  Troponin negative. Patient will be seen by cardiology. Initially patient has significant bradycardia, now heart rate has been around 110.  Bradycardia most likely due to medication effect.  Beta-blocker is restarted.  Continue to hold the calcium channel blocker. Eliquis restarted.  Acute kidney injury. Renal function has normalized today.  Alcohol abuse. History of polydrug abuse. Patient has a normal lipase level, no evidence of acute pancreatitis. Acute cholecystitis is also ruled out.  Acute metabolic encephalopathy. Mental status had improved today.  Lactic acidosis. No evidence of  infection.       DVT prophylaxis: Eliquis Code Status: full Family Communication:  Disposition Plan:    Status is: Inpatient  Remains inpatient appropriate because:Ongoing diagnostic testing needed not appropriate for outpatient work up and Inpatient level of care appropriate due to severity of illness  Dispo: The patient is from: Home              Anticipated d/c is to: Home              Patient currently is not medically stable to d/c.   Difficult to place patient No        I/O last 3 completed shifts: In: 3138.4 [I.V.:56.3; IV Piggyback:3082.1] Out: 4500 [Urine:4500] Total I/O In: -  Out: 500 [Urine:500]     Consultants:  Cardiology  Procedures: None  Antimicrobials: None  Subjective: Patient no longer has any confusion today.  He was bradycardic last night, now heart rate was running 110 to 125. He denies any short of breath, no cough. No abdominal pain or nausea vomiting.  No diarrhea. No dysuria or hematuria. No fever or chills.   Objective: Vitals:   03/10/21 0600 03/10/21 0700 03/10/21 0800 03/10/21 0900  BP: (!) 154/106 (!) 169/102 (!) 149/96 (!) 157/94  Pulse: 83 98 (!) 121 (!) 115  Resp:      Temp:      TempSrc:      SpO2: 90% 94% 97% 92%  Weight:      Height:        Intake/Output Summary (Last 24 hours) at 03/10/2021 1037 Last data filed at 03/10/2021 0908 Gross per 24 hour  Intake 86.28 ml  Output 3700 ml  Net -3613.72 ml   Filed Weights   03/09/21 0127 03/10/21 0500  Weight: 70.8 kg 71.9 kg    Examination:  General exam: Appears calm and comfortable  Respiratory system: Clear to auscultation. Respiratory effort normal. Cardiovascular system: Irregular and tachycardic. No JVD, murmurs, rubs, gallops or clicks. No pedal edema. Gastrointestinal system: Abdomen is nondistended, soft and nontender. No organomegaly or masses felt. Normal bowel sounds heard. Central nervous system: Alert and oriented. No focal neurological  deficits. Extremities: Symmetric 5 x 5 power. Skin: No rashes, lesions or ulcers Psychiatry: Mood & affect appropriate.     Data Reviewed: I have personally reviewed following labs and imaging studies  CBC: Recent Labs  Lab 03/09/21 0132 03/10/21 0627  WBC 5.9 5.5  HGB 13.8 13.7  HCT 38.4* 37.7*  MCV 95.5 92.9  PLT 163 216   Basic Metabolic Panel: Recent Labs  Lab 03/09/21 0132 03/10/21 0627  NA 137 137  K 3.8 4.0  CL 103 105  CO2 23 25  GLUCOSE 108* 92  BUN 8 9  CREATININE 1.29* 1.08  CALCIUM 8.1* 9.0  MG 1.9 1.9  PHOS  --  2.9   GFR: Estimated Creatinine Clearance: 61.5 mL/min (by C-G formula based on SCr of 1.08 mg/dL). Liver Function Tests: Recent Labs  Lab 03/09/21 0132 03/10/21 0627  AST 135* 283*  ALT 53* 217*  ALKPHOS 70 80  BILITOT 0.5 1.0  PROT 6.0* 7.0  ALBUMIN 3.6 3.8   Recent Labs  Lab 03/09/21 0132 03/10/21 0627  LIPASE 87* 35  AMYLASE  --  61   No results for input(s): AMMONIA in the last 168 hours. Coagulation Profile: Recent Labs  Lab 03/09/21 0132  INR 1.1   Cardiac Enzymes: No results for input(s): CKTOTAL, CKMB, CKMBINDEX, TROPONINI in the last 168 hours. BNP (last 3 results) No results for input(s): PROBNP in the last 8760 hours. HbA1C: No results for input(s): HGBA1C in the last 72 hours. CBG: Recent Labs  Lab 03/09/21 0507  GLUCAP 149*   Lipid Profile: Recent Labs    03/09/21 0423  TRIG 145   Thyroid Function Tests: Recent Labs    03/10/21 0627  TSH 1.556   Anemia Panel: No results for input(s): VITAMINB12, FOLATE, FERRITIN, TIBC, IRON, RETICCTPCT in the last 72 hours. Sepsis Labs: Recent Labs  Lab 03/09/21 0132 03/09/21 0423 03/09/21 0741 03/09/21 2003 03/10/21 0627  PROCALCITON <0.10  --   --   --  0.24  LATICACIDVEN  --  3.7* 3.5* 1.3  --     Recent Results (from the past 240 hour(s))  Resp Panel by RT-PCR (Flu A&B, Covid) Nasopharyngeal Swab     Status: None   Collection Time: 03/09/21   1:51 AM   Specimen: Nasopharyngeal Swab; Nasopharyngeal(NP) swabs in vial transport medium  Result Value Ref Range Status   SARS Coronavirus 2 by RT PCR NEGATIVE NEGATIVE Final    Comment: (NOTE) SARS-CoV-2 target nucleic acids are NOT DETECTED.  The SARS-CoV-2 RNA is generally detectable in upper respiratory specimens during the acute phase of infection. The lowest concentration of SARS-CoV-2 viral copies this assay can detect is 138 copies/mL. A negative result does not preclude SARS-Cov-2 infection and should not be used as the sole basis for treatment or other patient management decisions. A negative result may occur with  improper specimen collection/handling, submission of specimen other than nasopharyngeal swab, presence of viral mutation(s) within the areas targeted by this assay, and inadequate number of viral copies(<138 copies/mL). A negative result must be combined with clinical observations, patient history, and epidemiological information. The  expected result is Negative.  Fact Sheet for Patients:  BloggerCourse.com  Fact Sheet for Healthcare Providers:  SeriousBroker.it  This test is no t yet approved or cleared by the [place] FDA and  has been authorized for detection and/or diagnosis of SARS-CoV-2 by FDA under an Emergency Use Authorization (EUA). This EUA will remain  in effect (meaning this test can be used) for the duration of the COVID-19 declaration under Section 564(b)(1) of the Act, 21 U.S.C.section 360bbb-3(b)(1), unless the authorization is terminated  or revoked sooner.       Influenza A by PCR NEGATIVE NEGATIVE Final   Influenza B by PCR NEGATIVE NEGATIVE Final    Comment: (NOTE) The Xpert Xpress SARS-CoV-2/FLU/RSV plus assay is intended as an aid in the diagnosis of influenza from Nasopharyngeal swab specimens and should not be used as a sole basis for treatment. Nasal washings and aspirates  are unacceptable for Xpert Xpress SARS-CoV-2/FLU/RSV testing.  Fact Sheet for Patients: BloggerCourse.com  Fact Sheet for Healthcare Providers: SeriousBroker.it  This test is not yet approved or cleared by the [place] FDA and has been authorized for detection and/or diagnosis of SARS-CoV-2 by FDA under an Emergency Use Authorization (EUA). This EUA will remain in effect (meaning this test can be used) for the duration of the COVID-19 declaration under Section 564(b)(1) of the Act, 21 U.S.C. section 360bbb-3(b)(1), unless the authorization is terminated or revoked.  Performed at [facility], [address] [place], [place] [address]   CULTURE, BLOOD (ROUTINE X 2) w Reflex to ID Panel     Status: None (Preliminary result)   Collection Time: 03/09/21  4:23 AM   Specimen: BLOOD  Result Value Ref Range Status   Specimen Description BLOOD LEFT ARM  Final   Special Requests   Final    BOTTLES DRAWN AEROBIC AND ANAEROBIC Blood Culture adequate volume   Culture   Final    NO GROWTH 1 DAY Performed at [facility], [address] [place], [place] [address]    Report Status PENDING  Incomplete  MRSA Next Gen by PCR, Nasal     Status: None   Collection Time: 03/09/21  6:00 AM   Specimen: Nasal Mucosa; Nasal Swab  Result Value Ref Range Status   MRSA by PCR Next Gen NOT DETECTED NOT DETECTED Final    Comment: (NOTE) The GeneXpert MRSA Assay (FDA approved for NASAL specimens only), is one component of a comprehensive MRSA colonization surveillance program. It is not intended to diagnose MRSA infection nor to guide or monitor treatment for MRSA infections. Test performance is not FDA approved in patients less than 56 years old. Performed at [facility], [address] [place], [place] [address]          Radiology Studies: NM Hepatobiliary Liver Func  Result Date: 03/09/2021 CLINICAL DATA:   Chronic RIGHT upper quadrant abdominal pain question cholecystitis EXAM: NUCLEAR MEDICINE HEPATOBILIARY IMAGING TECHNIQUE: Sequential images of the abdomen were obtained out to 60 minutes following intravenous administration of radiopharmaceutical. RADIOPHARMACEUTICALS:  5.47 mCi Tc-45m  Choletec IV COMPARISON:  None FINDINGS: Prompt tracer extraction from bloodstream indicating normal hepatocellular function. Prompt excretion of tracer into biliary tree. Gallbladder visualized at 9 minutes. Small bowel visualized at 39 minutes. No focal hepatic retention of tracer. IMPRESSION: Patent biliary tree. No evidence of cystic duct obstruction or cholecystitis. Electronically Signed   By: Ulyses Southward M.D.   On: 03/09/2021 15:48   DG Chest Port 1 View  Result  Date: 03/09/2021 CLINICAL DATA:  Chest pain EXAM: PORTABLE CHEST 1 VIEW COMPARISON:  01/30/2021 FINDINGS: The heart size and mediastinal contours are within normal limits. Both lungs are clear. The visualized skeletal structures are unremarkable. IMPRESSION: No active disease. Electronically Signed   By: Deatra Robinson M.D.   On: 03/09/2021 01:47   CT Angio Chest/Abd/Pel for Dissection W and/or W/WO  Result Date: 03/09/2021 CLINICAL DATA:  Thoracic aortic aneurysm suspected. Abdominal pain. Aortic dissection suspected. Chest pain. Abdominal pain. Alcohol and cocaine use. Chronic atrial fibrillation on Eliquis. Now with bradycardia and hypotension. EXAM: CT ANGIOGRAPHY CHEST, ABDOMEN AND PELVIS TECHNIQUE: Non-contrast CT of the chest was initially obtained. Multidetector CT imaging through the chest, abdomen and pelvis was performed using the standard protocol during bolus administration of intravenous contrast. Multiplanar reconstructed images and MIPs were obtained and reviewed to evaluate the vascular anatomy. CONTRAST:  OMNIPAQUE IOHEXOL 350 MG/ML SOLN COMPARISON:  CT chest screening study 08/17/2016 FINDINGS: CTA CHEST FINDINGS Cardiovascular:  Unenhanced images demonstrate scattered calcification of the aorta and coronary arteries. No evidence of intramural hematoma. Images obtained during arterial phase after administration of intravenous contrast material demonstrate normal caliber thoracic aorta. No aneurysm or dissection. Fluid around the aortic root is likely in the pericardial reflection. Normal heart size. No pericardial effusions. Mediastinum/Nodes: Esophagus is decompressed. Thyroid gland is unremarkable. No significant lymphadenopathy. Lungs/Pleura: Visualization is limited due to artifact but there is no obvious consolidation or edema. Probable dependent changes in the lung bases. No pleural effusions. No obvious pneumothorax. There is evidence of debris in the trachea, likely mucous secretions. Musculoskeletal: No chest wall abnormality. No acute or significant osseous findings. Review of the MIP images confirms the above findings. CTA ABDOMEN AND PELVIS FINDINGS VASCULAR Aorta: Diffuse aortic calcification with mild mural thrombus. The aorta remains patent without evidence of dissection or aneurysm. Celiac: Patent without evidence of aneurysm, dissection, vasculitis or significant stenosis. SMA: Patent without evidence of aneurysm, dissection, vasculitis or significant stenosis. Renals: Calcification at the origin of the renal arteries without obvious occlusion. Nephrograms are symmetrical. No aneurysm. IMA: Patent without evidence of aneurysm, dissection, vasculitis or significant stenosis. Inflow: Diffuse calcific and noncalcific changes throughout the common iliac and external iliac arteries bilaterally. The vessels remain patent although there are likely areas of significant stenosis. Veins: No obvious venous abnormality within the limitations of this arterial phase study. Review of the MIP images confirms the above findings. NON-VASCULAR Hepatobiliary: No focal liver lesions identified. The gallbladder is somewhat contracted but  demonstrates diffuse wall thickening and edema, likely due to cholecystitis. This could be confirmed at ultrasound if clinically indicated. No bile duct dilatation. Pancreas: Visualization is technically limited but there appears to be mild edema and stranding around the pancreas particularly at the head. This could represent reactive inflammation related to the cholecystitis or it could indicate mild pancreatitis. No loculated collection. Spleen: Spleen is atrophic. Adrenals/Urinary Tract: Adrenal glands are unremarkable. Kidneys are normal, without renal calculi, focal lesion, or hydronephrosis. Bladder is decompressed. Stomach/Bowel: Stomach is filled with fluid and ingested material. No obvious wall thickening. Small bowel and colon are mostly decompressed. Scattered stool is in the colon. Appendix is normal. Lymphatic: No significant lymphadenopathy. Reproductive: Prostate gland is not enlarged. Other: No free air or free fluid in the abdomen. Abdominal wall musculature appears intact. Musculoskeletal: No acute or significant osseous findings. Review of the MIP images confirms the above findings. IMPRESSION: 1. No evidence of aneurysm or dissection involving the thoracic or abdominal aorta. 2.  Extensive aortoiliac atherosclerosis. Probable areas of stenosis in the iliac and external iliac arteries bilaterally. 3. No evidence of active pulmonary disease. 4. Gallbladder wall thickening and edema likely indicating acute cholecystitis. Mild stranding around the pancreas may be reactive or may indicate early acute pancreatitis. 5. Examination is technically limited due to patient's body habitus, motion, and patient positioning. Electronically Signed   By: Burman Nieves M.D.   On: 03/09/2021 03:30   US Abdomen Limited RUQ (LIVER/GB)  Result Date: 03/09/2021 CLINICAL DATA:  Right upper quadrant abdomen pain for 2 days. EXAM: ULTRASOUND ABDOMEN LIMITED RIGHT UPPER QUADRANT COMPARISON:  None. FINDINGS:  Gallbladder: No gallstones. The gallbladder wall measures 5 mm. Small amount of pericholecystic fluid is identified. No sonographic Murphy sign noted by sonographer. Common bile duct: Diameter: 2 mm Liver: No focal lesion identified. there is diffuse increased echotexture of liver. Portal vein is patent on color Doppler imaging with normal direction of blood flow towards the liver. Other: None. IMPRESSION: Gallbladder wall thickening with pericholecystic fluid. No sonographic Eulah Pont sign is reported. The findings are equivocal for acute cholecystitis. Clinical correlation is recommended. Fatty infiltration of liver. Electronically Signed   By: Sherian Rein M.D.   On: 03/09/2021 10:53        Scheduled Meds:  apixaban  5 mg Oral BID   Chlorhexidine Gluconate Cloth  6 each Topical Daily   folic acid  1 mg Oral Daily   LORazepam  0-4 mg Oral Q4H   Followed by   Melene Muller ON 03/11/2021] LORazepam  0-4 mg Oral Q8H   metoprolol tartrate  50 mg Oral BID   multivitamin with minerals  1 tablet Oral Daily   pantoprazole (PROTONIX) IV  40 mg Intravenous Q24H   [START ON 03/14/2021] thiamine injection  100 mg Intravenous Daily   Continuous Infusions:  thiamine injection 250 mg (03/10/21 1027)     LOS: 1 day    Time spent: 32 minutes    Marrion Coy, MD [facility]   To contact the attending provider between 7A-7P or the covering provider during after hours 7P-7A, please log into the web site [email or web address] and access using universal [place] password for that web site. If you do not have the password, please call the hospital operator.  03/10/2021, 10:37 AM

## 2021-03-10 NOTE — Consult Note (Addendum)
Cardiology Consultation:   Patient ID: Darren Rogers MRN: [id number]; DOB: 1956/04/29  Admit date: 03/09/2021 Date of Consult: 03/10/2021  PCP:  Patient, No Pcp Per (Inactive)   [facility]  Cardiologist:  Lorine Bears, MD  Advanced Practice Provider:  No care team member to display Electrophysiologist:  None  Patient Profile:   Darren Rogers is a 65 y.o. male with a hx of nonobstructive CAD, persistent atrial fibrillation with history of medication noncompliance, hypertension/hypertensive heart disease, hyperlipidemia, erectile dysfunction, polysubstance abuse including alcohol and tobacco use, peripheral arterial disease, and who is being seen today for the evaluation of chest pain and bradycardia with underlying atrial fibrillation at the request of Dr. Chipper Herb.  History of Present Illness:   Darren Rogers is a 65 year old male with PMH as above.  He has history of atrial fibrillation, diagnosed more than 20 years ago and intermittent compliance with medical therapy.  2015 LHC with nonobstructive CAD.  07/2020 echo with EF 50 to 55%, NR WMA, mild LVH, mild LAE, mild MR, mild AR.  07/2020 MPI without evidence of ischemia and overall ruled low risk.    He has not been seen in the [facility] office for some time now with last office visit 11/04/2017 with his primary cardiologist.  At that time, and his atrial fibrillation was well controlled with flecainide and metoprolol.  He had stopped taking Xarelto, as he did not like blood thinners.  He continued to smoke half pack per day.  Since that time, he has had multiple ED visits without outpatient follow-up after discharge.  On review of most recent ED visits, there 2/2 running out of his medications and ongoing alcohol/polysubstance use. He reports that, since his last admission, he has had ongoing alcohol use at 3-4 beers per day.  He continues to smoke at 1/2 pack/day of cigarettes.  He reports cocaine use, though he states he  has not had cocaine for some time. 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  On 03/09/2021, he presented to the emergency department with report of chest pain that started around 10 PM the previous day.  He reports the chest pain occurred after drinking his usual 3-4 beers earlier that day.  He then went upstairs to get something to eat and had some Pasta.  Shortly thereafter, he reports chest pressure and sharp pain that occurred in the center of his chest and persisted for several hours.  Chest pain was made worse with lying flat and better with sitting upright.  Eventually, his chest pain self resolved with sitting upright and abstaining from further food.  He reports that in retrospect he feels his chest pain was due to reflux.  In the past, he has had dizziness associated with his chest pain though notes this was not the case.  Chest pain is the same as previous episodes in the past after having alcohol in large amounts.  He has had dry heaving but no emesis.  He denies any signs or symptoms of bleeding/GIB.  He denies any tachypalpitations or feelings of presyncope, despite presenting to the emergency department in atrial fibrillation with slow ventricular rate.  No recent syncopal episodes or falls per patient report.  He denies any signs of volume overload.  He reports his breathing is stable.  He does report leg weakness with minimal ambulation  today.  He reports depression. He reports rx compliance. At presentation to the emergency department, he was found to be bradycardic with atropine administered x3 and Zoll pads applied to his chest..  Initial vitals significant for BP 73/54 with HR 44 bpm and 95% on room air.  EKG showed atrial fibrillation with slow ventricular rate and repolarization abnormalities.   Past Medical History:   Diagnosis Date   Claudication (HCC)    a. 06/2015 ABI: R - 0.73, L - 0.73. 30-49% bilat SFA stenosis. 50-74% R Profunda stenosis; b. 01/2017 ABI: R 0.89, L 0.88, TBI R 0.65, L 1.0.   Erectile dysfunction    Essential hypertension    History of echocardiogram    a. 03/29/2014: EF 55-60%, mild LVH, normal RVSP;  b. 05/2015 Echo: EF 60-65%, triv AI, mild MR; c. 07/2020 Echo:    Hyperlipidemia    Non-obstructive CAD    a. 01/27/2014 Cath: LM nl, LAD mild diff dz w/o obs, LCx no sig obs - scattered 20-30% mLCx, RCA no obs dz, EF 70%; b. 06/2015 MV; EF 60%, no ischemia.   PAF (paroxysmal atrial fibrillation) (HCC)    a. Pt says Dx >58yrs ago w/ ? RFCA @ [facility];  b. Recurrent 12/2013;  b. Rx Flecainide and Xarelto-->Intermittent compliance.   PVC's (premature ventricular contractions)    a. 05/2017 [facility]: 4000 PVCs in 48 hrs (2%). Brief run of SVT.   Tobacco abuse    a. ongoing - 1 ppd.    Past Surgical History:  Procedure Laterality Date   CARDIAC CATHETERIZATION     CARDIAC CATHETERIZATION     [facility]   LEFT HEART CATH Right 01/27/2014   Procedure: LEFT HEART CATH;  Surgeon: Micheline Chapman, MD;  Location: [facility] CATH LAB;  Service: Cardiovascular;  Laterality: Right;     Home Medications:  Prior to Admission medications   Medication Sig Start Date End Date Taking? Authorizing Provider  amoxicillin (AMOXIL) 500 MG capsule Take 500 mg by mouth as needed. Patient not taking: Reported on 01/30/2021    [provider]  apixaban (ELIQUIS) 5 MG TABS tablet Take 1 tablet (5 mg total) by mouth 2 (two) times daily. 12/08/20 03/08/21  Darlin Priestly, MD  diltiazem (CARDIZEM CD) 240 MG 24 hr capsule Take 1 capsule (240 mg total) by mouth daily. 12/08/20 03/08/21  Darlin Priestly, MD  ibuprofen (ADVIL) 400 MG tablet Take 1 tablet (400 mg total) by mouth every 6 (six) hours as needed. Patient taking differently: Take 400 mg by mouth every 6 (six) hours as needed for fever, headache or mild pain. 12/08/20   Darlin Priestly, MD   metoprolol succinate (TOPROL-XL) 100 MG 24 hr tablet Take 1 tablet (100 mg total) by mouth daily. 12/08/20 03/08/21  Darlin Priestly, MD  thiamine 100 MG tablet Take 1 tablet (100 mg total) by mouth daily. Patient not taking: Reported on 01/30/2021 12/09/20   Darlin Priestly, MD    Inpatient Medications: Scheduled Meds:  apixaban  5 mg Oral BID   Chlorhexidine Gluconate Cloth  6 each Topical Daily   folic acid  1 mg Oral Daily   LORazepam  0-4 mg Oral Q4H   Followed by   Melene Muller ON 03/11/2021] LORazepam  0-4 mg Oral Q8H   multivitamin with minerals  1 tablet Oral Daily   pantoprazole (PROTONIX) IV  40 mg Intravenous Q24H   [START ON 03/14/2021] thiamine injection  100 mg Intravenous Daily   Continuous Infusions:  thiamine injection Stopped (03/09/21 0958)  PRN Meds: docusate sodium, LORazepam **OR** LORazepam, ondansetron (ZOFRAN) IV, polyethylene glycol  Allergies:   No Known Allergies  Social History:   Social History   Socioeconomic History   Marital status: Single    Spouse name: Not on file   Number of children: Not on file   Years of education: Not on file   Highest education level: Not on file  Occupational History   Not on file  Tobacco Use   Smoking status: Every Day    Packs/day: 0.50    Years: 30.00    Pack years: 15.00    Types: Cigarettes   Smokeless tobacco: Never  Substance and Sexual Activity   Alcohol use: Yes    Alcohol/week: 12.0 standard drinks    Types: 12 Cans of beer per week   Drug use: Yes    Types: Cocaine   Sexual activity: Not on file  Other Topics Concern   Not on file  Social History Narrative   The patient lives in [place] with his sister. He works in a substance abuse program. 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 He is a long-time smoker, one pack per day. No illicit drugs. He is not married.   Social Determinants of Health   Financial Resource Strain: Not on file  Food Insecurity: Not on file   Transportation Needs: Not on file  Physical Activity: Not on file  Stress: Not on file  Social Connections: Not on file  Intimate Partner Violence: Not on file    Family History:    Family History  Problem Relation Age of Onset   Coronary artery disease Father 78   Diabetes Mother    Diabetes Sister    Diabetes Brother    Diabetes Maternal Grandmother    Diabetes Sister    Diabetes Sister      ROS:  Please see the history of present illness.  Review of Systems  Constitutional:  Positive for malaise/fatigue. Negative for diaphoresis.  Respiratory:  Negative for cough, hemoptysis, shortness of breath and wheezing.   Cardiovascular:  Positive for chest pain. Negative for palpitations, orthopnea, leg swelling and PND.  Gastrointestinal:  Positive for abdominal pain, heartburn and nausea. Negative for blood in stool, melena and vomiting.  Genitourinary:  Negative for hematuria.  Musculoskeletal:  Negative for falls.  Neurological:  Positive for tremors and weakness. Negative for dizziness and loss of consciousness.  Psychiatric/Behavioral:  Positive for depression and substance abuse.   b All other ROS reviewed and negative.     Physical Exam/Data:   Vitals:   03/10/21 0305 03/10/21 0400 03/10/21 0500 03/10/21 0600  BP: (!) 152/99 (!) 157/106 (!) 171/111 (!) 154/106  Pulse: (!) 110 (!) 102 93 83  Resp:      Temp:  98.4 F (36.9 C)    TempSrc:  Oral    SpO2: 90% 92% 93% 90%  Weight:   71.9 kg   Height:        Intake/Output Summary (Last 24 hours) at 03/10/2021 0807 Last data filed at 03/10/2021 0645 Gross per 24 hour  Intake 86.28 ml  Output 3575 ml  Net -3488.72 ml   Last 3 Weights 03/10/2021 03/09/2021 01/30/2021  Weight (lbs) 158 lb 8.2 oz 156 lb 165 lb 2 oz  Weight (kg) 71.9 kg 70.761 kg 74.9 kg     Body mass index is 25.58 kg/m.  General: Male with tremor, appears older than stated age HEENT: normal Neck:  no JVD Vascular: No carotid bruits; radial pulses  2+ bilaterally s  Cardiac:  normal S1, S2; IR IR and tachycardic; no murmur  Lungs: Right basilar crackles, left base reduced breath sounds, coarse breath sounds bilaterally otherwise Abd: Firm, distended, not TTP Ext: no edema Musculoskeletal:  No deformities, tremor of upper extremities Skin: warm and dry  Neuro: Tremor of upper extremities Psych:  Normal affect   EKG:  The EKG was personally reviewed and demonstrates: Atrial fibrillation with slow ventricular rate Telemetry:  Telemetry was personally reviewed and demonstrates: Atrial fibrillation with rapid ventricular rate and ventricular rate 90s to 110s on telemetry  Relevant CV Studies: Echo 07/2020 Left ventricular ejection fraction, by estimation, is 50 to 55%. The left ventricle has low normal function. The left ventricle has no regional wall motion abnormalities. There is mild left ventricular hypertrophy. Left ventricular diastolic parameters are indeterminate. The average left ventricular global longitudinal strain is -8.7 %. The global longitudinal strain is abnormal. 2. Right ventricular systolic function is mildly reduced. The right ventricular size is normal. Moderately increased right ventricular wall thickness. 3. Left atrial size was mildly dilated. 4. The mitral valve is normal in structure. Mild mitral valve regurgitation. No evidence of mitral stenosis. 5. The aortic valve is tricuspid. There is mild thickening of the aortic valve. Aortic valve regurgitation is mild. Mild aortic valve sclerosis is present, with no evidence of aortic valve stenosis. 6. The inferior vena cava is normal in size with greater than 50% respiratory variability, suggesting right atrial pressure of 3 mmHg.  MPI 07/2020 No evidence of significant ischemia or scar.  Normal LV SF by visual estimate.  Calculated LVEF unreliable due to gating issues related to A. fib.  CAC and aortic atherosclerosis noted on attenuation correction CT.  Low  risk study.  Laboratory Data:  High Sensitivity Troponin:   Recent Labs  Lab 03/09/21 0132 03/09/21 0423  TROPONINIHS 13 10     Chemistry Recent Labs  Lab 03/09/21 0132 03/10/21 0627  NA 137 137  K 3.8 4.0  CL 103 105  CO2 23 25  GLUCOSE 108* 92  BUN 8 9  CREATININE 1.29* 1.08  CALCIUM 8.1* 9.0  GFRNONAA >60 >60  ANIONGAP 11 7    Recent Labs  Lab 03/09/21 0132 03/10/21 0627  PROT 6.0* 7.0  ALBUMIN 3.6 3.8  AST 135* 283*  ALT 53* 217*  ALKPHOS 70 80  BILITOT 0.5 1.0   Hematology Recent Labs  Lab 03/09/21 0132 03/10/21 0627  WBC 5.9 5.5  RBC 4.02* 4.06*  HGB 13.8 13.7  HCT 38.4* 37.7*  MCV 95.5 92.9  MCH 34.3* 33.7  MCHC 35.9 36.3*  RDW 16.5* 16.2*  PLT 163 216   BNPNo results for input(s): BNP, PROBNP in the last 168 hours.  DDimer No results for input(s): DDIMER in the last 168 hours.   Radiology/Studies:  NM Hepatobiliary Liver Func  Result Date: 03/09/2021 CLINICAL DATA:  Chronic RIGHT upper quadrant abdominal pain question cholecystitis EXAM: NUCLEAR MEDICINE HEPATOBILIARY IMAGING TECHNIQUE: Sequential images of the abdomen were obtained out to 60 minutes following intravenous administration of radiopharmaceutical. RADIOPHARMACEUTICALS:  5.47 mCi Tc-73m  Choletec IV COMPARISON:  None FINDINGS: Prompt tracer extraction from bloodstream indicating normal hepatocellular function. Prompt excretion of tracer into biliary tree. Gallbladder visualized at 9 minutes. Small bowel visualized at 39 minutes. No focal hepatic retention of tracer. IMPRESSION: Patent biliary tree. No evidence of cystic duct obstruction or cholecystitis. Electronically Signed   By: Loraine Leriche  Tyron Russell M.D.   On: 03/09/2021 15:48   DG Chest Port 1 View  Result Date: 03/09/2021 CLINICAL DATA:  Chest pain EXAM: PORTABLE CHEST 1 VIEW COMPARISON:  01/30/2021 FINDINGS: The heart size and mediastinal contours are within normal limits. Both lungs are clear. The visualized skeletal structures are  unremarkable. IMPRESSION: No active disease. Electronically Signed   By: Deatra Robinson M.D.   On: 03/09/2021 01:47   CT Angio Chest/Abd/Pel for Dissection W and/or W/WO  Result Date: 03/09/2021 CLINICAL DATA:  Thoracic aortic aneurysm suspected. Abdominal pain. Aortic dissection suspected. Chest pain. Abdominal pain. Alcohol and cocaine use. Chronic atrial fibrillation on Eliquis. Now with bradycardia and hypotension. EXAM: CT ANGIOGRAPHY CHEST, ABDOMEN AND PELVIS TECHNIQUE: Non-contrast CT of the chest was initially obtained. Multidetector CT imaging through the chest, abdomen and pelvis was performed using the standard protocol during bolus administration of intravenous contrast. Multiplanar reconstructed images and MIPs were obtained and reviewed to evaluate the vascular anatomy. CONTRAST:  OMNIPAQUE IOHEXOL 350 MG/ML SOLN COMPARISON:  CT chest screening study 08/17/2016 FINDINGS: CTA CHEST FINDINGS Cardiovascular: Unenhanced images demonstrate scattered calcification of the aorta and coronary arteries. No evidence of intramural hematoma. Images obtained during arterial phase after administration of intravenous contrast material demonstrate normal caliber thoracic aorta. No aneurysm or dissection. Fluid around the aortic root is likely in the pericardial reflection. Normal heart size. No pericardial effusions. Mediastinum/Nodes: Esophagus is decompressed. Thyroid gland is unremarkable. No significant lymphadenopathy. Lungs/Pleura: Visualization is limited due to artifact but there is no obvious consolidation or edema. Probable dependent changes in the lung bases. No pleural effusions. No obvious pneumothorax. There is evidence of debris in the trachea, likely mucous secretions. Musculoskeletal: No chest wall abnormality. No acute or significant osseous findings. Review of the MIP images confirms the above findings. CTA ABDOMEN AND PELVIS FINDINGS VASCULAR Aorta: Diffuse aortic calcification with mild  mural thrombus. The aorta remains patent without evidence of dissection or aneurysm. Celiac: Patent without evidence of aneurysm, dissection, vasculitis or significant stenosis. SMA: Patent without evidence of aneurysm, dissection, vasculitis or significant stenosis. Renals: Calcification at the origin of the renal arteries without obvious occlusion. Nephrograms are symmetrical. No aneurysm. IMA: Patent without evidence of aneurysm, dissection, vasculitis or significant stenosis. Inflow: Diffuse calcific and noncalcific changes throughout the common iliac and external iliac arteries bilaterally. The vessels remain patent although there are likely areas of significant stenosis. Veins: No obvious venous abnormality within the limitations of this arterial phase study. Review of the MIP images confirms the above findings. NON-VASCULAR Hepatobiliary: No focal liver lesions identified. The gallbladder is somewhat contracted but demonstrates diffuse wall thickening and edema, likely due to cholecystitis. This could be confirmed at ultrasound if clinically indicated. No bile duct dilatation. Pancreas: Visualization is technically limited but there appears to be mild edema and stranding around the pancreas particularly at the head. This could represent reactive inflammation related to the cholecystitis or it could indicate mild pancreatitis. No loculated collection. Spleen: Spleen is atrophic. Adrenals/Urinary Tract: Adrenal glands are unremarkable. Kidneys are normal, without renal calculi, focal lesion, or hydronephrosis. Bladder is decompressed. Stomach/Bowel: Stomach is filled with fluid and ingested material. No obvious wall thickening. Small bowel and colon are mostly decompressed. Scattered stool is in the colon. Appendix is normal. Lymphatic: No significant lymphadenopathy. Reproductive: Prostate gland is not enlarged. Other: No free air or free fluid in the abdomen. Abdominal wall musculature appears intact.  Musculoskeletal: No acute or significant osseous findings. Review of the MIP images confirms the  above findings. IMPRESSION: 1. No evidence of aneurysm or dissection involving the thoracic or abdominal aorta. 2. Extensive aortoiliac atherosclerosis. Probable areas of stenosis in the iliac and external iliac arteries bilaterally. 3. No evidence of active pulmonary disease. 4. Gallbladder wall thickening and edema likely indicating acute cholecystitis. Mild stranding around the pancreas may be reactive or may indicate early acute pancreatitis. 5. Examination is technically limited due to patient's body habitus, motion, and patient positioning. Electronically Signed   By: Burman Nieves M.D.   On: 03/09/2021 03:30   US Abdomen Limited RUQ (LIVER/GB)  Result Date: 03/09/2021 CLINICAL DATA:  Right upper quadrant abdomen pain for 2 days. EXAM: ULTRASOUND ABDOMEN LIMITED RIGHT UPPER QUADRANT COMPARISON:  None. FINDINGS: Gallbladder: No gallstones. The gallbladder wall measures 5 mm. Small amount of pericholecystic fluid is identified. No sonographic Murphy sign noted by sonographer. Common bile duct: Diameter: 2 mm Liver: No focal lesion identified. there is diffuse increased echotexture of liver. Portal vein is patent on color Doppler imaging with normal direction of blood flow towards the liver. Other: None. IMPRESSION: Gallbladder wall thickening with pericholecystic fluid. No sonographic Eulah Pont sign is reported. The findings are equivocal for acute cholecystitis. Clinical correlation is recommended. Fatty infiltration of liver. Electronically Signed   By: Sherian Rein M.D.   On: 03/09/2021 10:53     Assessment and Plan:   Chronic atrial fibrillation with initially slow ventricular rate, followed by rapid ventricular rate --Initially presented with bradycardic rate in atrial fibrillation now s/p atropine with most recent ventricular rates elevated into the 90s to 110s and elevated BP.  Ongoing atrial  fibrillation in the setting of medical nonadherence and ongoing alcohol use.  Continue current anticoagulation with patient denying any signs or symptoms of bleeding and most recent H&H stable.  TSH WNL and electrolytes at goal..  Restart PTA rate control with metoprolol tartrate 50mg  BID and consolidate to Toprol at discharge. Hold off on restart of diltiazem for now. Suspect bradycardia in setting of taking his rx at once versus other days without rx, as well as suspected vasovagal event with multiple episodes dry heaving reported prior to presenting bradycardic and hypotensive.  Monitor closely on telemetry.   Atypical chest pain History of coronary artery disease -- Reports atypical chest pain, similar to that reported in the past and occurring s/p excessive alcohol use with worsening symptoms after eating.  Suspect GI etiology of chest pain with most recent scan showing possible involvement of gallbladder and pancreas.  High-sensitivity troponin 10 and not consistent with ACS. No indication for IV heparin. Previous MPI without evidence of ischemia and echo with normal EF.  Continue work-up per GI.  No recommendation for invasive ischemic work-up at this time.  Essential hypertension --Not well controlled.  Recommend restart rate control with lopressor as tolerated and consolidate before discharge.  HLD --Continue statin once liver enzymes allow.  Most recent LFTs elevated in the setting of ongoing EtOH use.  Polysubstance use --Reports ongoing alcohol, tobacco use.  Currently reports drinking 3-4 beers per day and smoking 1/2 pack cigarettes per day.  Ethanol at presentation 265.  UDS negative.  Elevated LFTs. complete cessation of alcohol and tobacco recommended.  PAD -- Imaging this admission shows extensive aortoiliac atherosclerosis with probable areas of stenosis in the iliac and external iliac arteries bilaterally.  Recommend risk factor modification and follow-up with Dr. Kirke Corin in the  clinic for further evaluation.     For questions or updates, please contact [facility] Please consult  [email or web address] for contact info under    Signed, Lennon Alstrom, PA-C  03/10/2021 8:07 AM

## 2021-03-11 ENCOUNTER — Inpatient Hospital Stay (HOSPITAL_COMMUNITY)
Admit: 2021-03-11 | Discharge: 2021-03-11 | Disposition: A | Discharge status: Home / Self Care | Payer: Medicare Other | Attending: Internal Medicine | Admitting: Internal Medicine

## 2021-03-11 DIAGNOSIS — I4891 Unspecified atrial fibrillation: Secondary | ICD-10-CM

## 2021-03-11 DIAGNOSIS — G9340 Encephalopathy, unspecified: Secondary | ICD-10-CM

## 2021-03-11 LAB — PROCALCITONIN: Procalcitonin: <0.1 ng/mL

## 2021-03-11 LAB — ECHOCARDIOGRAM COMPLETE
AR max vel: 3.09 cm2
AV Area VTI: 3.3 cm2
AV Area mean vel: 2.83 cm2
AV Mean grad: 3 mmHg
AV Peak grad: 4.9 mmHg
Ao pk vel: 1.11 m/s
Area-P 1/2: 4.31 cm2
Height: 66 in
MV VTI: 3.29 cm2
S' Lateral: 3.29 cm
Weight: 2500.9 oz

## 2021-03-11 LAB — CBC WITH DIFFERENTIAL/PLATELET
Abs Immature Granulocytes: 0.01 10*3/uL (ref 0.00–0.07)
Basophils Absolute: 0 10*3/uL (ref 0.0–0.1)
Basophils Relative: 1 %
Eosinophils Absolute: 0.1 10*3/uL (ref 0.0–0.5)
Eosinophils Relative: 2 %
HCT: 37.5 % — ABNORMAL LOW (ref 39.0–52.0)
Hemoglobin: 13.5 g/dL (ref 13.0–17.0)
Immature Granulocytes: 0 %
Lymphocytes Relative: 43 %
Lymphs Abs: 2.1 10*3/uL (ref 0.7–4.0)
MCH: 33.3 pg (ref 26.0–34.0)
MCHC: 36 g/dL (ref 30.0–36.0)
MCV: 92.4 fL (ref 80.0–100.0)
Monocytes Absolute: 0.4 10*3/uL (ref 0.1–1.0)
Monocytes Relative: 8 %
Neutro Abs: 2.3 10*3/uL (ref 1.7–7.7)
Neutrophils Relative %: 46 %
Platelets: 207 10*3/uL (ref 150–400)
RBC: 4.06 MIL/uL — ABNORMAL LOW (ref 4.22–5.81)
RDW: 15.8 % — ABNORMAL HIGH (ref 11.5–15.5)
WBC: 4.9 10*3/uL (ref 4.0–10.5)
nRBC: 0 % (ref 0.0–0.2)

## 2021-03-11 LAB — BASIC METABOLIC PANEL
Anion gap: 8 (ref 5–15)
BUN: 15 mg/dL (ref 8–23)
CO2: 23 mmol/L (ref 22–32)
Calcium: 8.7 mg/dL — ABNORMAL LOW (ref 8.9–10.3)
Chloride: 105 mmol/L (ref 98–111)
Creatinine, Ser: 1.06 mg/dL (ref 0.61–1.24)
GFR, Estimated: >60 mL/min (ref >60)
Glucose, Bld: 123 mg/dL — ABNORMAL HIGH (ref 70–99)
Potassium: 3.7 mmol/L (ref 3.5–5.1)
Sodium: 136 mmol/L (ref 135–145)

## 2021-03-11 LAB — MAGNESIUM: Magnesium: 2 mg/dL (ref 1.7–2.4)

## 2021-03-11 LAB — PHOSPHORUS: Phosphorus: 3.5 mg/dL (ref 2.5–4.6)

## 2021-03-11 MED ORDER — DILTIAZEM HCL 30 MG PO TABS
30.0000 mg | ORAL_TABLET | Freq: Four times a day (QID) | ORAL | Status: DC
  Administered 2021-03-11 – 2021-03-12 (×4): 30 mg via ORAL
  Filled 2021-03-11 (×4): qty 1

## 2021-03-11 MED ORDER — METOPROLOL TARTRATE 50 MG PO TABS
100.0000 mg | ORAL_TABLET | Freq: Two times a day (BID) | ORAL | Status: DC
  Administered 2021-03-11 – 2021-03-12 (×3): 100 mg via ORAL
  Filled 2021-03-11 (×4): qty 2

## 2021-03-11 MED ORDER — DIGOXIN 0.25 MG/ML IJ SOLN
0.2500 mg | Freq: Once | INTRAMUSCULAR | Status: AC
  Administered 2021-03-11: 0.25 mg via INTRAVENOUS
  Filled 2021-03-11: qty 2

## 2021-03-11 NOTE — Progress Notes (Signed)
Progress Note  Patient Name: Darren Rogers Date of Encounter: 03/11/2021  Primary Cardiologist: Lorine Bears, MD   Subjective   Reports improvement overall in CP since admission. He is breathing well today. After eating, he still has discomfort located in the center of his chest that feels like a burning and is made worse with lying down.    He had difficulty sleeping last night with pt informed this as likely due to agitation s/p cessation of EtOH.  He reports ongoing lower extremity pain with ambulation.   Inpatient Medications    Scheduled Meds:  apixaban  5 mg Oral BID   diltiazem  30 mg Oral Q6H   folic acid  1 mg Oral Daily   LORazepam  0-4 mg Oral Q4H   Followed by   LORazepam  0-4 mg Oral Q8H   metoprolol tartrate  100 mg Oral BID   multivitamin with minerals  1 tablet Oral Daily   pantoprazole (PROTONIX) IV  40 mg Intravenous Q24H   [START ON 03/14/2021] thiamine injection  100 mg Intravenous Daily   Continuous Infusions:  thiamine injection 250 mg (03/11/21 0927)   PRN Meds: docusate sodium, LORazepam **OR** LORazepam, ondansetron (ZOFRAN) IV, polyethylene glycol   Vital Signs    Vitals:   03/11/21 0930 03/11/21 0959 03/11/21 1119 03/11/21 1205  BP:  139/81 (!) 137/99 (!) 138/99  Pulse:  (!) 139 (!) 112 (!) 129  Resp: (!) 24  17   Temp:   98.1 F (36.7 C)   TempSrc:      SpO2:  99% 100% 100%  Weight:      Height:        Intake/Output Summary (Last 24 hours) at 03/11/2021 1311 Last data filed at 03/11/2021 0940 Gross per 24 hour  Intake 770 ml  Output 750 ml  Net 20 ml   Last 3 Weights 03/11/2021 03/10/2021 03/09/2021  Weight (lbs) 156 lb 4.9 oz 158 lb 8.2 oz 156 lb  Weight (kg) 70.9 kg 71.9 kg 70.761 kg      Telemetry    Atrial fibrillation with aberrancy and PVCs.  Ventricular rates predominantly in the 130s with occasional elevation into the 170s- Personally Reviewed  ECG    No new tracings- Personally Reviewed  Physical Exam    GEN: No acute distress.   Neck: No JVD Cardiac: IRIR and tachycardic, no murmurs, rubs, or gallops.  Respiratory: Coarse breath sounds bilaterally. GI: Soft, nontender, non-distended  MS: No edema; No deformity. Neuro:  Nonfocal  Psych: Normal affect   Labs    High Sensitivity Troponin:   Recent Labs  Lab 03/09/21 0132 03/09/21 0423  TROPONINIHS 13 10      Chemistry Recent Labs  Lab 03/09/21 0132 03/10/21 0627 03/11/21 0441  NA 137 137 136  K 3.8 4.0 3.7  CL 103 105 105  CO2 23 25 23   GLUCOSE 108* 92 123*  BUN 8 9 15   CREATININE 1.29* 1.08 1.06  CALCIUM 8.1* 9.0 8.7*  PROT 6.0* 7.0  --   ALBUMIN 3.6 3.8  --   AST 135* 283*  --   ALT 53* 217*  --   ALKPHOS 70 80  --   BILITOT 0.5 1.0  --   GFRNONAA >60 >60 >60  ANIONGAP 11 7 8      Hematology Recent Labs  Lab 03/09/21 0132 03/10/21 0627 03/11/21 0441  WBC 5.9 5.5 4.9  RBC 4.02* 4.06* 4.06*  HGB 13.8 13.7 13.5  HCT 38.4* 37.7*  37.5*  MCV 95.5 92.9 92.4  MCH 34.3* 33.7 33.3  MCHC 35.9 36.3* 36.0  RDW 16.5* 16.2* 15.8*  PLT 163 216 207    BNPNo results for input(s): BNP, PROBNP in the last 168 hours.   DDimer No results for input(s): DDIMER in the last 168 hours.   Radiology    NM Hepatobiliary Liver Func  Result Date: 03/09/2021 CLINICAL DATA:  Chronic RIGHT upper quadrant abdominal pain question cholecystitis EXAM: NUCLEAR MEDICINE HEPATOBILIARY IMAGING TECHNIQUE: Sequential images of the abdomen were obtained out to 60 minutes following intravenous administration of radiopharmaceutical. RADIOPHARMACEUTICALS:  5.47 mCi Tc-61m  Choletec IV COMPARISON:  None FINDINGS: Prompt tracer extraction from bloodstream indicating normal hepatocellular function. Prompt excretion of tracer into biliary tree. Gallbladder visualized at 9 minutes. Small bowel visualized at 39 minutes. No focal hepatic retention of tracer. IMPRESSION: Patent biliary tree. No evidence of cystic duct obstruction or cholecystitis.  Electronically Signed   By: Ulyses Southward M.D.   On: 03/09/2021 15:48    Cardiac Studies   Echo 07/2020 Left ventricular ejection fraction, by estimation, is 50 to 55%. The left ventricle has low normal function. The left ventricle has no regional wall motion abnormalities. There is mild left ventricular hypertrophy. Left ventricular diastolic parameters are indeterminate. The average left ventricular global longitudinal strain is -8.7 %. The global longitudinal strain is abnormal. 2. Right ventricular systolic function is mildly reduced. The right ventricular size is normal. Moderately increased right ventricular wall thickness. 3. Left atrial size was mildly dilated. 4. The mitral valve is normal in structure. Mild mitral valve regurgitation. No evidence of mitral stenosis. 5. The aortic valve is tricuspid. There is mild thickening of the aortic valve. Aortic valve regurgitation is mild. Mild aortic valve sclerosis is present, with no evidence of aortic valve stenosis. 6. The inferior vena cava is normal in size with greater than 50% respiratory variability, suggesting right atrial pressure of 3 mmHg.   MPI 07/2020 No evidence of significant ischemia or scar.  Normal LV SF by visual estimate.  Calculated LVEF unreliable due to gating issues related to A. fib.  CAC and aortic atherosclerosis noted on attenuation correction CT.  Low risk study.  Patient Profile     65 y.o. male with hx of nonobstructive CAD, persistent atrial fibrillation with history of medication noncompliance, hypertension/hypertensive heart disease, hyperlipidemia, erectile dysfunction, polysubstance abuse including alcohol and tobacco use, peripheral arterial disease, and who is being seen today for the evaluation of chest pain and bradycardic ventricular rates s/p atropine and now RVR with underlying atrial fibrillation at the request of Dr. Chipper Herb.  Assessment & Plan    Chronic atrial fibrillation with initially  slow ventricular rate, followed by rapid ventricular rate --Initially presented with ventricular rate bradycardic and hypotension, suspected 2/2 vasovagal etiology - reported dry heaving for some time after excessive EtOH use and shortly after eating with CP that then was worse with laying down. Received atropine at presentation. Ventricular rates now tachycardic and telemetry showing ongoing atrial fibrillation with ventricular rates 130s (predominantly) and elevation at times into the 170s. Continue rate control with goal ventricular rate 110 or lower Continue lopressor 100mg  BID -->consolidate at discharge.  Started short acting diltiazem 30 mg q6h -->consolidate at discharge.   Continue Eliquis 5 mg twice daily for OAC.   Recommend PPI with OAC. Most recent H&H stable. Echo pending to reassess EF given ongoing admissions for uncontrolled ventricular rates and time since last echo.   Atypical chest pain,  suspected 2/2 GERD History of coronary artery disease -- Reports atypical chest pain that starts shortly after eating with known excessive alcohol use.  CP made worse with with laying down / eating / EtOH. Suspect GI etiology.  HS Tn 10 and not consistent with ACS. Previous MPI without evidence of ischemia. Will repeat an echo to reassess EF and rule out any acute structural changes given time since last echo and ongoing admissions for uncontrolled ventricular rates.  Further recommendations if indicated pending echo.  Recommend PPI for GI symptoms and given on anticoagulation.     Essential hypertension --Not well controlled.  Restarted diltiazem 30 mg every 6 hours with plan to consolidate at discharge.  Continue Lopressor, which will also be consolidated discharge. Further antihypertensives recommended if ongoing elevated BP s/p addition of diltiazem.    HLD --Continue statin once liver enzymes allow.  Most recent LFTs elevated in the setting of ongoing EtOH use. ALT 217. Will continue to  hold off on statin.   Polysubstance use --Reports ongoing alcohol, tobacco use.  Currently reports drinking 3-4 beers per day and smoking 1/2 pack cigarettes per day.  Ethanol at presentation 265.  UDS negative.  Elevated LFTs. complete cessation of alcohol and tobacco recommended.   PAD -- Reports bilateral lower extremity pain with ambulation.  Imaging this admission shows extensive aortoiliac atherosclerosis with probable areas of stenosis in the iliac and external iliac arteries bilaterally.  Recommend risk factor modification and follow-up with Dr. Kirke Corin in the clinic for further evaluation.  As above, holding off on statin given elevated LFTs.  Continue Eliwuis in lieu of ASA.  For questions or updates, please contact [facility] Please consult [email or web address] for contact info under        Signed, Lennon Alstrom, PA-C  03/11/2021, 1:11 PM

## 2021-03-11 NOTE — Progress Notes (Signed)
   03/11/21 0930  Assess: MEWS Score  ECG Heart Rate (!) 145 (MD notified)  Resp (!) 24  Assess: MEWS Score  MEWS Temp 0  MEWS Systolic 0  MEWS Pulse 3  MEWS RR 1  MEWS LOC 0  MEWS Score 4  MEWS Score Color Red  Assess: if the MEWS score is Yellow or Red  Were vital signs taken at a resting state? Yes  Focused Assessment No change from prior assessment  Does the patient meet 2 or more of the SIRS criteria? No  MEWS guidelines implemented *See Row Information* Yes  Treat  MEWS Interventions Administered scheduled meds/treatments  Pain Scale 0-10  Pain Score 0  Take Vital Signs  Increase Vital Sign Frequency  Red: Q 1hr X 4 then Q 4hr X 4, if remains red, continue Q 4hrs  Escalate  MEWS: Escalate Red: discuss with charge nurse/RN and provider, consider discussing with RRT  Notify: Charge Nurse/RN  Name of Charge Nurse/RN Notified Shanda Bumps  Date Charge Nurse/RN Notified 03/11/21  Time Charge Nurse/RN Notified [id number]  Notify: Provider  Provider Name/Title Dr. Chipper Herb  Date Provider Notified 03/11/21  Time Provider Notified [phone]  Notification Type Page  Notification Reason Other (Comment) (afib - 130-150 range)  Provider response See new orders (Dr. Okey Dupre new orders)  Date of Provider Response 03/11/21  Time of Provider Response [phone]  Assess: SIRS CRITERIA  SIRS Temperature  0  SIRS Pulse 1  SIRS Respirations  1  SIRS WBC 0  SIRS Score Sum  2   New order PO Cardizem administered at 10:00am.  BP obtained at 10:00am - 139/81

## 2021-03-11 NOTE — Progress Notes (Signed)
Rounding on pt, discovered him up to the bathroom and the room smelled strongly of smoke. Charge RN Toniann Fail and NT Wylene Men also confirm that the room smells strongly of smoke. Asked the patient if he was smoking, pt denies. Advised the pt of the hospital's no smoking policy at this time. Pt states understanding but continues to deny any smoking.

## 2021-03-11 NOTE — Progress Notes (Signed)
Mobility Specialist - Progress Note   03/11/21 1542  Mobility  Activity Refused mobility  Mobility performed by Mobility specialist    Pt declined mobility, no reason specified. States he's been trying to sleep, but keeps getting disturbed. Pt agreeable to attempt activity tomorrow. Will attempt another date.    Darren Rogers Mobility Specialist 03/11/21, 3:43 PM

## 2021-03-11 NOTE — Progress Notes (Signed)
NAME:  Darren Rogers, MRN:  [id number], DOB:  [birth year], LOS: 2 ADMISSION DATE:  03/09/2021, CONSULTATION DATE:  03/09/2021 REFERRING MD:  Dr. York Cerise, CHIEF COMPLAINT: Chest pain  History of Present Illness:  65 year old male presenting to [facility] ED from home via EMS on 03/09/2021 with complaints of chest pain.  Per ED documentation he reported generalized sharp and burning chest pain that started after he tried to lay down and go to sleep after drinking " a lot" of alcohol.  He also reports feeling very weak and cold all over with no aggravating or alleviating factors.  He denied weakness numbness, as well as no fever or shortness of breath. ED course: Patient was hypotensive on arrival with SBP in the 70s and profoundly bradycardic with heart rate 39 to 40 bpm.  He complained of generalized abdominal tenderness to light palpation.  Patient reported drinking "more alcohol than usual" which she clarified to be 4-40 ounce beers.  He admits to smoking a pack a day since about age 66.  But denies any other substance use in the last few days including cocaine & marijuana.  He reports the chest pain he experienced upon lying down has happened before and in the past this has been alleviated by taking his metoprolol and diltiazem. He reported that last night (Saturday, 03/08/2021) & Friday night (03/07/2021) he felt chest pain once he laid down to go to sleep.  On Friday night & Saturday night at 9:30 PM he reports taking his metoprolol and diltiazem and Eliquis.  He denies taking additional doses of metoprolol and diltiazem during the day even though he knows that's when it is prescribed.  He reports having some nausea earlier on Saturday, 03/08/2021 with dry heaves but no actual vomiting.  He also admitted this is unusual for him. He denies fevers/chills, vomiting/diarrhea, productive cough/shortness of breath. Medications given: Atropine 0.5 mg x 3, droperidol 2.5 mg x 2, 500 mL IVF.  Epinephrine drip initiated due  to hypotension in the setting of symptomatic bradycardia. Initial Vitals: Afebrile 97.9, bradycardic in A. fib at 39, RR 17, hypotensive at 73/59 & SPO2 93% on room air Significant labs: (Labs/ Imaging personally reviewed) I, Cheryll Cockayne Rust-Chester, AGACNP-BC, personally viewed and interpreted this ECG. EKG Interpretation: Date: 03/09/2021, EKG Time: 1:30, Rate: 41, Rhythm: Atrial fibrillation, QRS Axis: Normal, Intervals: No PR in A. fib, ST/T Wave abnormalities: None, Narrative Interpretation: Atrial fibrillation and bradycardia Chemistry: Na+: 137, K+: 3.8, BUN/Cr.:  8/1.29, Serum CO2/ AG: 23/11 Hematology: WBC: 5.9, Hgb: 13.8 Troponin: 13 > 10, Lactic/ PCT: 3.7/ < 0.10, COVID-19 & Influenza A/B: Negative Alcohol level: 265  CXR 03/09/2021: No active disease CT angio chest/abdomen/pelvis: Gallbladder wall thickening and edema likely indicating acute cholecystitis.  Mild stranding around the pancreas may be reactive or may indicate acute early pancreatitis.  Extensive aortoiliac atherosclerosis.  Probable areas of stenosis in the iliac and external iliac arteries bilaterally.  PCCM consulted for admission due to hypotension requiring epinephrine drip.    03/11/21- PATIENT IS STABLE NOW, PCCM WILL SIGN OFF AND AVAILABLE IF NEEDED. HE IS EATING ON HIS OWN AN IS LUCID ABLE TO SPEAK IN FULL SENTENCES ON ROOM AIR.    Pertinent  Medical History  Non-obstructive CAD Paroxsymal Atrial Fibrillation on Eliquis Tobacco abuse Polysubstance abuse  HLD HTN ETOH abuse Significant Hospital Events: Including procedures, antibiotic start and stop dates in addition to other pertinent events   03/09/21: Patient admitted on epinephrine drip due to symptomatic bradycardia & suspected acute  cholecystitis.  Interim History / Subjective:  Patient drowsy and irritable but responsive & redirectable. Main current complaint is dry mouth, but swats and yells with light palpation of abdomen. + murphy's  sign  Objective   Blood pressure (!) 138/99, pulse (!) 129, temperature 98.1 F (36.7 C), resp. rate 17, height 5\' 6"  (1.676 m), weight 70.9 kg, SpO2 100 %.        Intake/Output Summary (Last 24 hours) at 03/11/2021 1239 Last data filed at 03/11/2021 0940 Gross per 24 hour  Intake 770 ml  Output 750 ml  Net 20 ml    Filed Weights   03/09/21 0127 03/10/21 0500 03/11/21 9774  Weight: 70.8 kg 71.9 kg 70.9 kg    Examination: General: Adult male, acutely ill, lying in bed- restless but- NAD HEENT: MM pink/dry, anicteric, atraumatic, neck supple Neuro: drowsy, A&O x 4, able to follow commands, PERRL +3, MAE CV: s1s2 irregular, atrial fibrillation on monitor, no r/m/g Pulm: Regular, non labored on room air, breath sounds clear-BUL & diminished-BLL GI: distended, generalized tenderness to light palpation R > L, + Murphy's sign,bs x 4 Skin: no rashes/lesions noted Extremities: warm/dry, pulses + 2 R/P, no edema noted  Resolved Hospital Problem list     Assessment & Plan:  Symptomatic Bradycardia secondary to BB & CCB ingestion  Paroxsymal Atrial Fibrillation PMHx: non-obstructive CAD, HTN, HLD, polysubstance abuse Patient reports taking prescribed amount this evening without double dosing.  However per patient's own admission he was acutely intoxicated this evening, so there is the possibility he could have taken more than prescribed.  Per chart review patient has had multiple visits to [facility] ED for A. fib RVR, will have to titrate epi drip very carefully in order to avoid A. fib RVR - epinephrine drip, wean as tolerated to maintain SBP > 90 & MAP > 65 - Continue outpatient Eliquis - consider atropine/ TC pacing if HR falls < 35 bpm  - continuous cardiac monitoring - UDS pending - hold outpatient medications: diltiazem, metoprolol -consider restarting as patient stabilizes  Acute Cholecystitis Lactic acidosis without SIRS CT angiogram showing possible acute cholecystitis.  EDP  Dr. York Cerise consulted Dr. Aleen Campi with general surgery who recommended follow-up RUQ ultrasound versus HIDA scan and we will follow-up with patient in the a.m. - RUQ ultrasound ordered, HIDA scan would have to wait till a.m. > will discuss with day team - Supplemental oxygen as needed, to maintain SpO2 > 90% - f/u cultures, trend lactic/ PCT - Daily CBC - monitor WBC/ fever curve - IV antibiotics: zosyn  - IVF hydration as needed: ordered 30 mL/kg 2 L of NS IV bolus - Continue epinephrine drip to maintain MAP< 65, see above - general surgery consulted, appreciate input  Mild acute Kidney Injury secondary to circulatory shock in the setting of symptomatic bradycardia and acute cholecystitis Baseline Cr: 1.0, Cr on admission:1.29 - Strict I/O's: alert provider if UOP < 0.5 mL/kg/hr - gentle IVF hydration  - Daily BMP, replace electrolytes PRN - Avoid nephrotoxic agents as able, ensure adequate renal perfusion  Acute on Chronic? Pancreatitis in the setting of ETOH [place] Classification: mild, Ranson's score: 0, BISAP score:1 - Trend pancreatic enzymes & hepatic function - triglycerides pending - Continue antibiotic coverage: Zosyn, see above - aggressive IVF resuscitation, trend as needed based off labs - no c/o pain except with palpation and patient acutely intoxicated, add pain management PRN - NPO, with ice chips  Acute Encephalopathy in the setting of ETOH abuse - folic  acid, multi-vitamin & thiamine daily - initiate CIWA protocol once patient has sobered - Falls precautions - supportive care  Best Practice (right click and "Reselect all SmartList Selections" daily)  Diet/type: NPO w/ oral meds DVT prophylaxis: DOAC GI prophylaxis: PPI Lines: N/A Foley:  N/A Code Status:  full code Last date of multidisciplinary goals of care discussion [03/09/21]  Labs   CBC: Recent Labs  Lab 03/09/21 0132 03/10/21 0627 03/11/21 0441  WBC 5.9 5.5 4.9  NEUTROABS  --   --  2.3  HGB  13.8 13.7 13.5  HCT 38.4* 37.7* 37.5*  MCV 95.5 92.9 92.4  PLT 163 216 207     Basic Metabolic Panel: Recent Labs  Lab 03/09/21 0132 03/10/21 0627 03/11/21 0441  NA 137 137 136  K 3.8 4.0 3.7  CL 103 105 105  CO2 23 25 23   GLUCOSE 108* 92 123*  BUN 8 9 15   CREATININE 1.29* 1.08 1.06  CALCIUM 8.1* 9.0 8.7*  MG 1.9 1.9 2.0  PHOS  --  2.9 3.5    GFR: Estimated Creatinine Clearance: 62.7 mL/min (by C-G formula based on SCr of 1.06 mg/dL). Recent Labs  Lab 03/09/21 0132 03/09/21 0423 03/09/21 0741 03/09/21 2003 03/10/21 0627 03/11/21 0441  PROCALCITON <0.10  --   --   --  0.24 <0.10  WBC 5.9  --   --   --  5.5 4.9  LATICACIDVEN  --  3.7* 3.5* 1.3  --   --      Liver Function Tests: Recent Labs  Lab 03/09/21 0132 03/10/21 0627  AST 135* 283*  ALT 53* 217*  ALKPHOS 70 80  BILITOT 0.5 1.0  PROT 6.0* 7.0  ALBUMIN 3.6 3.8    Recent Labs  Lab 03/09/21 0132 03/10/21 0627  LIPASE 87* 35  AMYLASE  --  61    No results for input(s): AMMONIA in the last 168 hours.  ABG    Component Value Date/Time   TCO2 23 01/27/2014 1548      Coagulation Profile: Recent Labs  Lab 03/09/21 0132  INR 1.1     Cardiac Enzymes: No results for input(s): CKTOTAL, CKMB, CKMBINDEX, TROPONINI in the last 168 hours.  HbA1C: Hemoglobin A1C  Date/Time Value Ref Range Status  03/28/2014 11:28 AM 6.4 (H) 4.2 - 6.3 % Final    Comment:    The American Diabetes Association recommends that a primary goal of therapy should be <7% and that physicians should reevaluate the treatment regimen in patients with HbA1c values consistently >8%.    HB A1C (BAYER DCA - WAIVED)  Date/Time Value Ref Range Status  07/30/2016 04:00 PM 6.1 <7.0 % Final    Comment:                                          Diabetic Adult            <7.0                                       Healthy Adult        4.3 - 5.7                                                           (  DCCT/NGSP) American  Diabetes Association's Summary of Glycemic Recommendations for Adults with Diabetes: Hemoglobin A1c <7.0%. More stringent glycemic goals (A1c <6.0%) may further reduce complications at the cost of increased risk of hypoglycemia.    Hgb A1c MFr Bld  Date/Time Value Ref Range Status  08/22/2015 08:27 AM 6.0 4.0 - 6.0 % Final  01/27/2014 05:05 PM 6.3 (H) <5.7 % Final    Comment:    (NOTE)                                                                       According to the ADA Clinical Practice Recommendations for 2011, when HbA1c is used as a screening test:  >=6.5%   Diagnostic of Diabetes Mellitus           (if abnormal result is confirmed) 5.7-6.4%   Increased risk of developing Diabetes Mellitus References:Diagnosis and Classification of Diabetes Mellitus,Diabetes Care,2011,34(Suppl 1):S62-S69 and Standards of Medical Care in         Diabetes - 2011,Diabetes Care,2011,34 (Suppl 1):S11-S61.    CBG: Recent Labs  Lab 03/09/21 0507  GLUCAP 149*     Review of Systems: Positives in BOLD  Gen: Denies fever, chills, weight change, fatigue, night sweats HEENT: Denies blurred vision, double vision, hearing loss, tinnitus, sinus congestion, rhinorrhea, sore throat, neck stiffness, dysphagia PULM: Denies shortness of breath, cough, sputum production, hemoptysis, wheezing CV: Denies chest pain on arrival to ED, edema, orthopnea, paroxysmal nocturnal dyspnea, palpitations GI: Denies abdominal pain, nausea, vomiting, diarrhea, hematochezia, melena, constipation, change in bowel habits GU: Denies dysuria, hematuria, polyuria, oliguria, urethral discharge Endocrine: Denies hot or cold intolerance, polyuria, polyphagia or appetite change Derm: Denies rash, dry skin, scaling or peeling skin change Heme: Denies easy bruising, bleeding, bleeding gums Neuro: Denies headache, numbness, weakness, slurred speech, loss of memory or consciousness Past Medical History:  He,  has a past medical  history of Claudication (HCC), Erectile dysfunction, Essential hypertension, History of echocardiogram, Hyperlipidemia, Non-obstructive CAD, PAF (paroxysmal atrial fibrillation) (HCC), PVC's (premature ventricular contractions), and Tobacco abuse.   Surgical History:   Past Surgical History:  Procedure Laterality Date   CARDIAC CATHETERIZATION     CARDIAC CATHETERIZATION     [facility]   LEFT HEART CATH Right 01/27/2014   Procedure: LEFT HEART CATH;  Surgeon: Micheline Chapman, MD;  Location: [facility] CATH LAB;  Service: Cardiovascular;  Laterality: Right;     Social History:   reports that he has been smoking cigarettes. He has a 15.00 pack-year smoking history. He has never used smokeless tobacco. He reports current alcohol use of about 12.0 standard drinks per week. He reports current drug use. Drug: Cocaine.   Family History:  His family history includes Coronary artery disease (age of onset: 19) in his father; Diabetes in his brother, maternal grandmother, mother, sister, sister, and sister.   Allergies No Known Allergies   Home Medications  Prior to Admission medications   Medication Sig Start Date End Date Taking? Authorizing Provider  amoxicillin (AMOXIL) 500 MG capsule Take 500 mg by mouth as needed. Patient not taking: Reported on 01/30/2021    [provider]  apixaban (ELIQUIS) 5 MG TABS tablet Take 1 tablet (5 mg total) by mouth 2 (two) times daily. 12/08/20  03/08/21  Darlin Priestly, MD  diltiazem (CARDIZEM CD) 240 MG 24 hr capsule Take 1 capsule (240 mg total) by mouth daily. 12/08/20 03/08/21  Darlin Priestly, MD  ibuprofen (ADVIL) 400 MG tablet Take 1 tablet (400 mg total) by mouth every 6 (six) hours as needed. Patient taking differently: Take 400 mg by mouth every 6 (six) hours as needed for fever, headache or mild pain. 12/08/20   Darlin Priestly, MD  metoprolol succinate (TOPROL-XL) 100 MG 24 hr tablet Take 1 tablet (100 mg total) by mouth daily. 12/08/20 03/08/21  Darlin Priestly, MD  thiamine 100 MG  tablet Take 1 tablet (100 mg total) by mouth daily. Patient not taking: Reported on 01/30/2021 12/09/20   Darlin Priestly, MD      Vida Rigger, M.D.  Pulmonary & Critical Care Medicine  [facility]

## 2021-03-11 NOTE — Progress Notes (Addendum)
Placed PROGRESS NOTE    Darren Rogers  WVP:[id number] DOB: 30-Mar-1956 DOA: 03/09/2021 PCP: Patient, No Pcp Per (Inactive)   Follow-up on atrial fibrillation with rapid ventricle response.   Brief Narrative:  Darren Rogers is a 65 year old male with history of chronic atrial fibrillation, chronic alcohol abuse, history of recreational drug abuse, coronary artery disease, essential hypertension, peripheral vascular disease, came to emergency room complaining of chest pain.  Upon arriving the hospital, he was found to have significant bradycardia and hypotension. Upon arriving the hospital, patient was started on epinephrine drip to maintain heart rate and blood pressure. Condition has improved 10/10, transferred to progressive unit. Patient also had right upper quadrant ultrasound, HIDA scan, seen by surgery, cholecystitis is ruled out. Patient has developed tachycardia with atrial fibrillation since 10/10.  Metoprolol was increased to 100 mg twice a day.  Cardiology prefer to use beta-blocker only if possible, added digoxin.   Assessment & Plan:   Active Problems:   Paroxysmal atrial fibrillation (HCC)   Other chest pain   Alcohol abuse   Alcoholic pancreatitis   Symptomatic bradycardia   Abdominal pain, RUQ   AKI (acute kidney injury) (HCC)   Lactic acidosis   Acute encephalopathy  Chest pain. Paroxysmal atrial fibrillation with bradycardia, now with rapid ventricle response. Coronary artery disease. Patient is followed by cardiology. He has history of polydrug abuse, but urine tox screen was negative at this admission. Chest pain is noncardiac per cardiology. Patient initially had a bradycardia with atrial fibrillation, which has resolved since yesterday.  Now he developed rapid ventricular response. I increase metoprolol dose to 100 mg twice a day. I will also give a dose of digoxin. Continue Eliquis.  Acute kidney injury secondary to hypotension. Hypotension Initial  symptomatic bradycardia. Patient has normal renal function from previous record, admission, creatinine was 1.29.  This is a secondary to hypotension. Patient required initially requiring epinephrine for hypotension and bradycardia, condition had improved. Renal function is improved.  Alcohol abuse. History of polydrug abuse. No evidence of acute pancreatitis, acute cholecystitis is also ruled out. Watch for withdrawal, continue CIWA protocol.  Acute metabolic encephalopathy. Condition improved  Lactic acidosis  No evidence of infection.   DVT prophylaxis: Eliquis Code Status: full Family Communication:  Disposition Plan:      Status is: Inpatient   Remains inpatient appropriate because:Ongoing diagnostic testing needed not appropriate for outpatient work up and Inpatient level of care appropriate due to severity of illness   Dispo: The patient is from: Home              Anticipated d/c is to: Home              Patient currently is not medically stable to d/c.              Difficult to place patient No       I/O last 3 completed shifts: In: 314.9 [P.O.:240; IV Piggyback:74.9] Out: 3300 [Urine:3300] Total I/O In: 480 [P.O.:480] Out: -    Consultants:  Cardiology   Procedures: None   Antimicrobials: None    Subjective: Patient has no confusion.  He does not feel palpitation, but heart rate has been running high today. He has no short of breath.  No cough. No abdominal pain, no nausea vomiting. No fever chills No dysuria or hematuria.  Objective: Vitals:   03/11/21 0930 03/11/21 0959 03/11/21 1119 03/11/21 1205  BP:  139/81 (!) 137/99 (!) 138/99  Pulse:  (!) 139 (!) 112 Marland Kitchen)  129  Resp: (!) 24  17   Temp:   98.1 F (36.7 C)   TempSrc:      SpO2:  99% 100% 100%  Weight:      Height:        Intake/Output Summary (Last 24 hours) at 03/11/2021 1211 Last data filed at 03/11/2021 0940 Gross per 24 hour  Intake 770 ml  Output 750 ml  Net 20 ml    Filed Weights   03/09/21 0127 03/10/21 0500 03/11/21 0614  Weight: 70.8 kg 71.9 kg 70.9 kg    Examination:  General exam: Appears calm and comfortable  Respiratory system: Clear to auscultation. Respiratory effort normal. Cardiovascular system: Irregular irregular, tachycardic. No JVD, murmurs, rubs, gallops or clicks. No pedal edema. Gastrointestinal system: Abdomen is nondistended, soft and nontender. No organomegaly or masses felt. Normal bowel sounds heard. Central nervous system: Alert and oriented. No focal neurological deficits. Extremities: Symmetric 5 x 5 power. Skin: No rashes, lesions or ulcers Psychiatry: Judgement and insight appear normal. Mood & affect appropriate.     Data Reviewed: I have personally reviewed following labs and imaging studies  CBC: Recent Labs  Lab 03/09/21 0132 03/10/21 0627 03/11/21 0441  WBC 5.9 5.5 4.9  NEUTROABS  --   --  2.3  HGB 13.8 13.7 13.5  HCT 38.4* 37.7* 37.5*  MCV 95.5 92.9 92.4  PLT 163 216 207   Basic Metabolic Panel: Recent Labs  Lab 03/09/21 0132 03/10/21 0627 03/11/21 0441  NA 137 137 136  K 3.8 4.0 3.7  CL 103 105 105  CO2 23 25 23   GLUCOSE 108* 92 123*  BUN 8 9 15   CREATININE 1.29* 1.08 1.06  CALCIUM 8.1* 9.0 8.7*  MG 1.9 1.9 2.0  PHOS  --  2.9 3.5   GFR: Estimated Creatinine Clearance: 62.7 mL/min (by C-G formula based on SCr of 1.06 mg/dL). Liver Function Tests: Recent Labs  Lab 03/09/21 0132 03/10/21 0627  AST 135* 283*  ALT 53* 217*  ALKPHOS 70 80  BILITOT 0.5 1.0  PROT 6.0* 7.0  ALBUMIN 3.6 3.8   Recent Labs  Lab 03/09/21 0132 03/10/21 0627  LIPASE 87* 35  AMYLASE  --  61   No results for input(s): AMMONIA in the last 168 hours. Coagulation Profile: Recent Labs  Lab 03/09/21 0132  INR 1.1   Cardiac Enzymes: No results for input(s): CKTOTAL, CKMB, CKMBINDEX, TROPONINI in the last 168 hours. BNP (last 3 results) No results for input(s): PROBNP in the last 8760  hours. HbA1C: No results for input(s): HGBA1C in the last 72 hours. CBG: Recent Labs  Lab 03/09/21 0507  GLUCAP 149*   Lipid Profile: Recent Labs    03/09/21 0423  TRIG 145   Thyroid Function Tests: Recent Labs    03/10/21 0627  TSH 1.556   Anemia Panel: No results for input(s): VITAMINB12, FOLATE, FERRITIN, TIBC, IRON, RETICCTPCT in the last 72 hours. Sepsis Labs: Recent Labs  Lab 03/09/21 0132 03/09/21 0423 03/09/21 0741 03/09/21 2003 03/10/21 0627 03/11/21 0441  PROCALCITON <0.10  --   --   --  0.24 <0.10  LATICACIDVEN  --  3.7* 3.5* 1.3  --   --     Recent Results (from the past 240 hour(s))  Resp Panel by RT-PCR (Flu A&B, Covid) Nasopharyngeal Swab     Status: None   Collection Time: 03/09/21  1:51 AM   Specimen: Nasopharyngeal Swab; Nasopharyngeal(NP) swabs in vial transport medium  Result Value Ref Range Status  SARS Coronavirus 2 by RT PCR NEGATIVE NEGATIVE Final    Comment: (NOTE) SARS-CoV-2 target nucleic acids are NOT DETECTED.  The SARS-CoV-2 RNA is generally detectable in upper respiratory specimens during the acute phase of infection. The lowest concentration of SARS-CoV-2 viral copies this assay can detect is 138 copies/mL. A negative result does not preclude SARS-Cov-2 infection and should not be used as the sole basis for treatment or other patient management decisions. A negative result may occur with  improper specimen collection/handling, submission of specimen other than nasopharyngeal swab, presence of viral mutation(s) within the areas targeted by this assay, and inadequate number of viral copies(<138 copies/mL). A negative result must be combined with clinical observations, patient history, and epidemiological information. The expected result is Negative.  Fact Sheet for Patients:  BloggerCourse.com  Fact Sheet for Healthcare Providers:  SeriousBroker.it  This test is no t yet  approved or cleared by the [place] FDA and  has been authorized for detection and/or diagnosis of SARS-CoV-2 by FDA under an Emergency Use Authorization (EUA). This EUA will remain  in effect (meaning this test can be used) for the duration of the COVID-19 declaration under Section 564(b)(1) of the Act, 21 U.S.C.section 360bbb-3(b)(1), unless the authorization is terminated  or revoked sooner.       Influenza A by PCR NEGATIVE NEGATIVE Final   Influenza B by PCR NEGATIVE NEGATIVE Final    Comment: (NOTE) The Xpert Xpress SARS-CoV-2/FLU/RSV plus assay is intended as an aid in the diagnosis of influenza from Nasopharyngeal swab specimens and should not be used as a sole basis for treatment. Nasal washings and aspirates are unacceptable for Xpert Xpress SARS-CoV-2/FLU/RSV testing.  Fact Sheet for Patients: BloggerCourse.com  Fact Sheet for Healthcare Providers: SeriousBroker.it  This test is not yet approved or cleared by the [place] FDA and has been authorized for detection and/or diagnosis of SARS-CoV-2 by FDA under an Emergency Use Authorization (EUA). This EUA will remain in effect (meaning this test can be used) for the duration of the COVID-19 declaration under Section 564(b)(1) of the Act, 21 U.S.C. section 360bbb-3(b)(1), unless the authorization is terminated or revoked.  Performed at [facility], [address] [place], [place] [address]   CULTURE, BLOOD (ROUTINE X 2) w Reflex to ID Panel     Status: None (Preliminary result)   Collection Time: 03/09/21  4:23 AM   Specimen: BLOOD  Result Value Ref Range Status   Specimen Description BLOOD LEFT ARM  Final   Special Requests   Final    BOTTLES DRAWN AEROBIC AND ANAEROBIC Blood Culture adequate volume   Culture   Final    NO GROWTH 2 DAYS Performed at [facility], [address] [place], [place] [address]    Report Status PENDING   Incomplete  MRSA Next Gen by PCR, Nasal     Status: None   Collection Time: 03/09/21  6:00 AM   Specimen: Nasal Mucosa; Nasal Swab  Result Value Ref Range Status   MRSA by PCR Next Gen NOT DETECTED NOT DETECTED Final    Comment: (NOTE) The GeneXpert MRSA Assay (FDA approved for NASAL specimens only), is one component of a comprehensive MRSA colonization surveillance program. It is not intended to diagnose MRSA infection nor to guide or monitor treatment for MRSA infections. Test performance is not FDA approved in patients less than 50 years old. Performed at [facility], [address], [place], [place] [address]  Radiology Studies: NM Hepatobiliary Liver Func  Result Date: 03/09/2021 CLINICAL DATA:  Chronic RIGHT upper quadrant abdominal pain question cholecystitis EXAM: NUCLEAR MEDICINE HEPATOBILIARY IMAGING TECHNIQUE: Sequential images of the abdomen were obtained out to 60 minutes following intravenous administration of radiopharmaceutical. RADIOPHARMACEUTICALS:  5.47 mCi Tc-47m  Choletec IV COMPARISON:  None FINDINGS: Prompt tracer extraction from bloodstream indicating normal hepatocellular function. Prompt excretion of tracer into biliary tree. Gallbladder visualized at 9 minutes. Small bowel visualized at 39 minutes. No focal hepatic retention of tracer. IMPRESSION: Patent biliary tree. No evidence of cystic duct obstruction or cholecystitis. Electronically Signed   By: Ulyses Southward M.D.   On: 03/09/2021 15:48        Scheduled Meds:  apixaban  5 mg Oral BID   digoxin  0.25 mg Intravenous Once   diltiazem  30 mg Oral Q6H   folic acid  1 mg Oral Daily   LORazepam  0-4 mg Oral Q4H   Followed by   LORazepam  0-4 mg Oral Q8H   metoprolol tartrate  100 mg Oral BID   multivitamin with minerals  1 tablet Oral Daily   pantoprazole (PROTONIX) IV  40 mg Intravenous Q24H   [START ON 03/14/2021] thiamine injection  100 mg Intravenous Daily   Continuous  Infusions:  thiamine injection 250 mg (03/11/21 0927)     LOS: 2 days    Time spent: 32 minutes    Marrion Coy, MD [facility]   To contact the attending provider between 7A-7P or the covering provider during after hours 7P-7A, please log into the web site [email or web address] and access using universal [place] password for that web site. If you do not have the password, please call the hospital operator.  03/11/2021, 12:11 PM

## 2021-03-11 NOTE — Progress Notes (Signed)
*  PRELIMINARY RESULTS* Echocardiogram 2D Echocardiogram has been performed.  Darren Rogers 03/11/2021, 1:21 PM

## 2021-03-11 NOTE — Consult Note (Signed)
PHARMACY CONSULT NOTE - FOLLOW UP  Pharmacy Consult for Electrolyte Monitoring and Replacement   Recent Labs: Potassium (mmol/L)  Date Value  03/11/2021 3.7  09/21/2014 3.5   Magnesium (mg/dL)  Date Value  94/17/4081 2.0  09/21/2014 1.8   Calcium (mg/dL)  Date Value  44/81/8563 8.7 (L)   Calcium, Total (mg/dL)  Date Value  14/97/0263 8.9   Albumin (g/dL)  Date Value  78/58/8502 3.8  11/04/2017 4.1  05/12/2014 3.4   Phosphorus (mg/dL)  Date Value  77/41/2878 3.5   Sodium (mmol/L)  Date Value  03/11/2021 136  08/04/2017 144  09/21/2014 139     Assessment: 64yo Male w/ h/o AFib/AFlutter (on Eliquis; intermittent compliance), component of CMP, history of EtOH & cocaine abuse. Presents with chest pain and EtOH intoxication with abd'l pain c/b bradycardia/hypoTN. Pharmacy consulted for electrolyte mgmt.  Goal of Therapy:  Lytes WNL  Plan:  No repletion at this time.  Patient transferred out of the ICU. Pharmacy will sign off. Please re-consult if needed.   Ronnald Ramp ,PharmD, BCPS Clinical Pharmacist 03/11/2021 7:43 AM

## 2021-03-12 ENCOUNTER — Other Ambulatory Visit: Payer: Self-pay | Admitting: Physician Assistant

## 2021-03-12 ENCOUNTER — Telehealth: Payer: Self-pay | Admitting: *Deleted

## 2021-03-12 ENCOUNTER — Inpatient Hospital Stay (HOSPITAL_COMMUNITY)
Admit: 2021-03-12 | Discharge: 2021-03-12 | Disposition: A | Discharge status: Home / Self Care | Payer: Medicare Other | Attending: Physician Assistant | Admitting: Physician Assistant

## 2021-03-12 ENCOUNTER — Telehealth: Payer: Self-pay | Admitting: Cardiovascular Disease

## 2021-03-12 ENCOUNTER — Encounter: Payer: Self-pay | Admitting: Pulmonary Disease

## 2021-03-12 DIAGNOSIS — I480 Paroxysmal atrial fibrillation: Secondary | ICD-10-CM

## 2021-03-12 LAB — BASIC METABOLIC PANEL
Anion gap: 9 (ref 5–15)
BUN: 17 mg/dL (ref 8–23)
CO2: 22 mmol/L (ref 22–32)
Calcium: 9.2 mg/dL (ref 8.9–10.3)
Chloride: 103 mmol/L (ref 98–111)
Creatinine, Ser: 1.06 mg/dL (ref 0.61–1.24)
GFR, Estimated: >60 mL/min (ref >60)
Glucose, Bld: 90 mg/dL (ref 70–99)
Potassium: 4.1 mmol/L (ref 3.5–5.1)
Sodium: 134 mmol/L — ABNORMAL LOW (ref 135–145)

## 2021-03-12 LAB — MAGNESIUM: Magnesium: 2 mg/dL (ref 1.7–2.4)

## 2021-03-12 LAB — PHOSPHORUS: Phosphorus: 4.2 mg/dL (ref 2.5–4.6)

## 2021-03-12 MED ORDER — METOPROLOL TARTRATE 100 MG PO TABS: 100.0000 mg | ORAL_TABLET | Freq: Two times a day (BID) | ORAL | 0 refills | Status: DC

## 2021-03-12 MED ORDER — DILTIAZEM HCL ER COATED BEADS 180 MG PO CP24: 180.0000 mg | ORAL_CAPSULE | Freq: Every day | ORAL | 0 refills | Status: DC

## 2021-03-12 MED ORDER — EZETIMIBE 10 MG PO TABS
10.0000 mg | ORAL_TABLET | Freq: Every day | ORAL | Status: DC
  Administered 2021-03-12: 10 mg via ORAL
  Filled 2021-03-12: qty 1

## 2021-03-12 MED ORDER — DILTIAZEM HCL ER COATED BEADS 180 MG PO CP24
180.0000 mg | ORAL_CAPSULE | Freq: Every day | ORAL | Status: DC
  Administered 2021-03-12: 180 mg via ORAL
  Filled 2021-03-12: qty 1

## 2021-03-12 MED ORDER — ISOSORBIDE MONONITRATE ER 30 MG PO TB24
15.0000 mg | ORAL_TABLET | Freq: Every day | ORAL | Status: DC
  Administered 2021-03-12: 15 mg via ORAL
  Filled 2021-03-12: qty 1

## 2021-03-12 MED ORDER — EZETIMIBE 10 MG PO TABS: 10.0000 mg | ORAL_TABLET | Freq: Every day | ORAL | 0 refills | Status: DC

## 2021-03-12 MED ORDER — ROSUVASTATIN CALCIUM 10 MG PO TABS
20.0000 mg | ORAL_TABLET | Freq: Every day | ORAL | Status: DC
  Administered 2021-03-12: 20 mg via ORAL
  Filled 2021-03-12: qty 2

## 2021-03-12 MED ORDER — ISOSORBIDE MONONITRATE ER 30 MG PO TB24: 15.0000 mg | ORAL_TABLET | Freq: Every day | ORAL | 0 refills | Status: DC

## 2021-03-12 NOTE — Telephone Encounter (Signed)
  1. Is this related to a heart monitor you are wearing?  (If the patient says no, please ask     if they are caling about ICD/pacemaker.) YES   2. What is your issue?? Zio calling to relay critical results.    (If the patient is calling for results of the heart monitor this     message should be sent to nurse.)    Please route to covering RN/CMA/RMA for results. Route to monitor technicians or your monitor tech representative for your site for any technical concerns

## 2021-03-12 NOTE — Telephone Encounter (Signed)
-----   Message from Lennon Alstrom, PA-C sent at 03/12/2021 11:09 AM EDT ----- Regarding: Hospital follow-up Hello,  This patient was admitted and seen at [facility] for Afib. We are placing a Zio XT to determine if we should refer to EP.  We are expecting discharge today 10/12.  Can you please call and arrange / schedule follow-up with Dr. Kirke Corin or APP in about 3-4 weeks s/p his monitor?   Thank you! Signed, Lennon Alstrom, PA-C 03/12/2021, 11:09 AM Pager [phone]

## 2021-03-12 NOTE — Plan of Care (Signed)

## 2021-03-12 NOTE — Discharge Summary (Signed)
Physician Discharge Summary  Darren Rogers ZMO:[id number] DOB: 20-Sep-1955 DOA: 03/09/2021  PCP: Patient, No Pcp Per (Inactive)  Admit date: 03/09/2021 Discharge date: 03/12/2021  Discharge disposition: Home   Recommendations for Outpatient Follow-Up:   Follow-up with Dr. Kirke Corin, cardiologist, 04/10/2021. Follow-up with PCP in 1 week.   Discharge Diagnosis:   Active Problems:   Paroxysmal atrial fibrillation (HCC)   Other chest pain   Alcohol abuse   Alcoholic pancreatitis   Symptomatic bradycardia   Abdominal pain, RUQ   AKI (acute kidney injury) (HCC)   Lactic acidosis   Acute encephalopathy    Discharge Condition: Stable.  Diet recommendation:  Diet Order             Diet - low sodium heart healthy           Diet regular Room service appropriate? Yes; Fluid consistency: Thin  Diet effective now                     Code Status: Full Code     Hospital Course:   Mr. Darren Rogers is a 65 year old man with medical history significant for persistent atrial fibrillation, alcohol use disorder, tobacco use disorder, history of substance use disorder, medical nonadherence, who presented to the hospital because of chest pain.  He was found to have significant bradycardia and hypotension.  He was treated with IV epinephrine infusion.  Initially, there was concern for acute cholecystitis based on right upper quadrant sonogram findings.  However, acute cholecystitis was ruled out with a HIDA scan.  Subsequently, he developed atrial fibrillation with rapid ventricular response.  Cardiologist was consulted to assist with management.  He was treated with oral metoprolol and Cardizem.  Overall, his heart rate has improved.  He feels better and he wants to be discharged home.  He is deemed stable for discharge to home.  Zio patch was placed to monitor his heart rhythm in the outpatient setting mainly to look for tachybradycardia/sick sinus syndrome.  Close follow-up with  PCP and cardiology was strongly recommended.   Discharge Exam:    Vitals:   03/12/21 0006 03/12/21 0440 03/12/21 0806 03/12/21 1134  BP: (!) 157/122 (!) 155/117 137/89 (!) 152/87  Pulse: 60 77 (!) 55 97  Resp: 18 18 18 18   Temp: 98.2 F (36.8 C) 98.2 F (36.8 C) 97.8 F (36.6 C) 97.8 F (36.6 C)  TempSrc: Oral Oral Oral Oral  SpO2: 98% 99% 99% 99%  Weight:      Height:         GEN: NAD SKIN: Warm and dry EYES: No pallor or icterus ENT: MMM CV: Irregular rate and rhythm PULM: CTA B ABD: soft, ND, NT, +BS CNS: AAO x 3, non focal EXT: No edema or tenderness   The results of significant diagnostics from this hospitalization (including imaging, microbiology, ancillary and laboratory) are listed below for reference.     Procedures and Diagnostic Studies:   No results found.   Labs:   Basic Metabolic Panel: Recent Labs  Lab 03/09/21 0132 03/10/21 0627 03/11/21 0441 03/12/21 0546  NA 137 137 136 134*  K 3.8 4.0 3.7 4.1  CL 103 105 105 103  CO2 23 25 23 22   GLUCOSE 108* 92 123* 90  BUN 8 9 15 17   CREATININE 1.29* 1.08 1.06 1.06  CALCIUM 8.1* 9.0 8.7* 9.2  MG 1.9 1.9 2.0 2.0  PHOS  --  2.9 3.5 4.2   GFR Estimated Creatinine Clearance: 62.7 mL/min (  by C-G formula based on SCr of 1.06 mg/dL). Liver Function Tests: Recent Labs  Lab 03/09/21 0132 03/10/21 0627  AST 135* 283*  ALT 53* 217*  ALKPHOS 70 80  BILITOT 0.5 1.0  PROT 6.0* 7.0  ALBUMIN 3.6 3.8   Recent Labs  Lab 03/09/21 0132 03/10/21 0627  LIPASE 87* 35  AMYLASE  --  61   No results for input(s): AMMONIA in the last 168 hours. Coagulation profile Recent Labs  Lab 03/09/21 0132  INR 1.1    CBC: Recent Labs  Lab 03/09/21 0132 03/10/21 0627 03/11/21 0441  WBC 5.9 5.5 4.9  NEUTROABS  --   --  2.3  HGB 13.8 13.7 13.5  HCT 38.4* 37.7* 37.5*  MCV 95.5 92.9 92.4  PLT 163 216 207   Cardiac Enzymes: No results for input(s): CKTOTAL, CKMB, CKMBINDEX, TROPONINI in the last 168  hours. BNP: Invalid input(s): POCBNP CBG: Recent Labs  Lab 03/09/21 0507  GLUCAP 149*   D-Dimer No results for input(s): DDIMER in the last 72 hours. Hgb A1c No results for input(s): HGBA1C in the last 72 hours. Lipid Profile No results for input(s): CHOL, HDL, LDLCALC, TRIG, CHOLHDL, LDLDIRECT in the last 72 hours. Thyroid function studies Recent Labs    03/10/21 0627  TSH 1.556   Anemia work up No results for input(s): VITAMINB12, FOLATE, FERRITIN, TIBC, IRON, RETICCTPCT in the last 72 hours. Microbiology Recent Results (from the past 240 hour(s))  Resp Panel by RT-PCR (Flu A&B, Covid) Nasopharyngeal Swab     Status: None   Collection Time: 03/09/21  1:51 AM   Specimen: Nasopharyngeal Swab; Nasopharyngeal(NP) swabs in vial transport medium  Result Value Ref Range Status   SARS Coronavirus 2 by RT PCR NEGATIVE NEGATIVE Final    Comment: (NOTE) SARS-CoV-2 target nucleic acids are NOT DETECTED.  The SARS-CoV-2 RNA is generally detectable in upper respiratory specimens during the acute phase of infection. The lowest concentration of SARS-CoV-2 viral copies this assay can detect is 138 copies/mL. A negative result does not preclude SARS-Cov-2 infection and should not be used as the sole basis for treatment or other patient management decisions. A negative result may occur with  improper specimen collection/handling, submission of specimen other than nasopharyngeal swab, presence of viral mutation(s) within the areas targeted by this assay, and inadequate number of viral copies(<138 copies/mL). A negative result must be combined with clinical observations, patient history, and epidemiological information. The expected result is Negative.  Fact Sheet for Patients:  BloggerCourse.com  Fact Sheet for Healthcare Providers:  SeriousBroker.it  This test is no t yet approved or cleared by the [place] FDA and  has been  authorized for detection and/or diagnosis of SARS-CoV-2 by FDA under an Emergency Use Authorization (EUA). This EUA will remain  in effect (meaning this test can be used) for the duration of the COVID-19 declaration under Section 564(b)(1) of the Act, 21 U.S.C.section 360bbb-3(b)(1), unless the authorization is terminated  or revoked sooner.       Influenza A by PCR NEGATIVE NEGATIVE Final   Influenza B by PCR NEGATIVE NEGATIVE Final    Comment: (NOTE) The Xpert Xpress SARS-CoV-2/FLU/RSV plus assay is intended as an aid in the diagnosis of influenza from Nasopharyngeal swab specimens and should not be used as a sole basis for treatment. Nasal washings and aspirates are unacceptable for Xpert Xpress SARS-CoV-2/FLU/RSV testing.  Fact Sheet for Patients: BloggerCourse.com  Fact Sheet for Healthcare Providers: SeriousBroker.it  This test is not yet approved  or cleared by the [place] and has been authorized for detection and/or diagnosis of SARS-CoV-2 by FDA under an Emergency Use Authorization (EUA). This EUA will remain in effect (meaning this test can be used) for the duration of the COVID-19 declaration under Section 564(b)(1) of the Act, 21 U.S.C. section 360bbb-3(b)(1), unless the authorization is terminated or revoked.  Performed at [facility], [address] [place], [place] [address]   CULTURE, BLOOD (ROUTINE X 2) w Reflex to ID Panel     Status: None (Preliminary result)   Collection Time: 03/09/21  4:23 AM   Specimen: BLOOD  Result Value Ref Range Status   Specimen Description BLOOD LEFT ARM  Final   Special Requests   Final    BOTTLES DRAWN AEROBIC AND ANAEROBIC Blood Culture adequate volume   Culture   Final    NO GROWTH 3 DAYS Performed at [facility], [address] [place], [place] [address]    Report Status PENDING  Incomplete  MRSA Next Gen by PCR, Nasal     Status: None    Collection Time: 03/09/21  6:00 AM   Specimen: Nasal Mucosa; Nasal Swab  Result Value Ref Range Status   MRSA by PCR Next Gen NOT DETECTED NOT DETECTED Final    Comment: (NOTE) The GeneXpert MRSA Assay (FDA approved for NASAL specimens only), is one component of a comprehensive MRSA colonization surveillance program. It is not intended to diagnose MRSA infection nor to guide or monitor treatment for MRSA infections. Test performance is not FDA approved in patients less than 18 years old. Performed at [facility], [address], [place], [place] [address]      Discharge Instructions:   Discharge Instructions     Diet - low sodium heart healthy   Complete by: As directed    Discharge instructions   Complete by: As directed    Avoid alcohol or illicit drugs   Increase activity slowly   Complete by: As directed       Allergies as of 03/12/2021   No Known Allergies      Medication List     STOP taking these medications    ibuprofen 400 MG tablet Commonly known as: ADVIL   metoprolol succinate 100 MG 24 hr tablet Commonly known as: TOPROL-XL   thiamine 100 MG tablet       TAKE these medications    amoxicillin 500 MG capsule Commonly known as: AMOXIL Take 500 mg by mouth as needed.   apixaban 5 MG Tabs tablet Commonly known as: ELIQUIS Take 1 tablet (5 mg total) by mouth 2 (two) times daily.   diltiazem 180 MG 24 hr capsule Commonly known as: CARDIZEM CD Take 1 capsule (180 mg total) by mouth daily. Start taking on: March 13, 2021 What changed:  medication strength how much to take   ezetimibe 10 MG tablet Commonly known as: ZETIA Take 1 tablet (10 mg total) by mouth daily. Start taking on: March 13, 2021   isosorbide mononitrate 30 MG 24 hr tablet Commonly known as: IMDUR Take 0.5 tablets (15 mg total) by mouth daily. Start taking on: March 13, 2021   metoprolol tartrate 100 MG tablet Commonly known as: LOPRESSOR Take 1  tablet (100 mg total) by mouth 2 (two) times daily.        Follow-up Information     Iran Ouch, MD Follow up on 04/10/2021.   Specialty: Cardiology Why: @ 11:20am Contact information: [address]  [address] [place] [address] [place] [address] [phone]                   If you experience worsening of your admission symptoms, develop shortness of breath, life threatening emergency, suicidal or homicidal thoughts you must seek medical attention immediately by calling 911 or calling your MD immediately  if symptoms less severe.   You must read complete instructions/literature along with all the possible adverse reactions/side effects for all the medicines you take and that have been prescribed to you. Take any new medicines after you have completely understood and accept all the possible adverse reactions/side effects.    Please note   You were cared for by a hospitalist during your hospital stay. If you have any questions about your discharge medications or the care you received while you were in the hospital after you are discharged, you can call the unit and asked to speak with the hospitalist on call if the hospitalist that took care of you is not available. Once you are discharged, your primary care physician will handle any further medical issues. Please note that NO REFILLS for any discharge medications will be authorized once you are discharged, as it is imperative that you return to your primary care physician (or establish a relationship with a primary care physician if you do not have one) for your aftercare needs so that they can reassess your need for medications and monitor your lab values.       Time coordinating discharge: 32 minutes  Signed:  Kelso Bibby  [facility] 03/12/2021, 3:26 PM   Pager on [email or web address]. If 7PM-7AM, please contact night-coverage at [email or web address]

## 2021-03-12 NOTE — Progress Notes (Signed)
Mobility Specialist - Progress Note   03/12/21 1500  Mobility  Activity Refused mobility  Mobility performed by Mobility specialist    Pt politely declined mobility, no reason specified. Pt sleeping on arrival, awakened by voice---anticipating d/c.    Darren Rogers Mobility Specialist 03/12/21, 3:29 PM

## 2021-03-12 NOTE — Progress Notes (Addendum)
Progress Note  Patient Name: Darren Rogers Date of Encounter: 03/12/2021  Primary Cardiologist: Lorine Bears, MD   Subjective   Reports improvement overall in CP since admission and ongoing CP with ambulation.  Discussed Zio XT x2 weeks to be placed before discharge. He will need follow-up with Dr. Kirke Corin or APP and Dr. Lalla Brothers.  Inpatient Medications    Scheduled Meds:  apixaban  5 mg Oral BID   diltiazem  180 mg Oral Daily   ezetimibe  10 mg Oral Daily   folic acid  1 mg Oral Daily   LORazepam  0-4 mg Oral Q8H   metoprolol tartrate  100 mg Oral BID   multivitamin with minerals  1 tablet Oral Daily   pantoprazole (PROTONIX) IV  40 mg Intravenous Q24H   [START ON 03/14/2021] thiamine injection  100 mg Intravenous Daily   Continuous Infusions:  thiamine injection Stopped (03/12/21 0945)   PRN Meds: docusate sodium, LORazepam **OR** LORazepam, ondansetron (ZOFRAN) IV, polyethylene glycol   Vital Signs    Vitals:   03/11/21 2054 03/12/21 0006 03/12/21 0440 03/12/21 0806  BP: (!) 183/113 (!) 157/122 (!) 155/117 137/89  Pulse: (!) 104 60 77 (!) 55  Resp: 18 18 18 18   Temp: 98.1 F (36.7 C) 98.2 F (36.8 C) 98.2 F (36.8 C) 97.8 F (36.6 C)  TempSrc:  Oral Oral Oral  SpO2: 99% 98% 99% 99%  Weight:      Height:        Intake/Output Summary (Last 24 hours) at 03/12/2021 1057 Last data filed at 03/12/2021 1031 Gross per 24 hour  Intake 575.75 ml  Output --  Net 575.75 ml    Last 3 Weights 03/11/2021 03/10/2021 03/09/2021  Weight (lbs) 156 lb 4.9 oz 158 lb 8.2 oz 156 lb  Weight (kg) 70.9 kg 71.9 kg 70.761 kg      Telemetry    Atrial fibrillation with aberrancy and PVCs.  Ventricular rates 90s to low 100s with occasional elevation into the mid 140s- Personally Reviewed  ECG    No new tracings- Personally Reviewed  Physical Exam   GEN: No acute distress.   Neck: No JVD Cardiac: IRIR and tachycardic, no murmurs, rubs, or gallops.  Respiratory: Coarse  breath sounds bilaterally. GI: Soft, nontender, non-distended  MS: No edema; No deformity. Neuro:  Nonfocal  Psych: Normal affect   Labs    High Sensitivity Troponin:   Recent Labs  Lab 03/09/21 0132 03/09/21 0423  TROPONINIHS 13 10       Chemistry Recent Labs  Lab 03/09/21 0132 03/10/21 0627 03/11/21 0441 03/12/21 0546  NA 137 137 136 134*  K 3.8 4.0 3.7 4.1  CL 103 105 105 103  CO2 23 25 23 22   GLUCOSE 108* 92 123* 90  BUN 8 9 15 17   CREATININE 1.29* 1.08 1.06 1.06  CALCIUM 8.1* 9.0 8.7* 9.2  PROT 6.0* 7.0  --   --   ALBUMIN 3.6 3.8  --   --   AST 135* 283*  --   --   ALT 53* 217*  --   --   ALKPHOS 70 80  --   --   BILITOT 0.5 1.0  --   --   GFRNONAA >60 >60 >60 >60  ANIONGAP 11 7 8 9       Hematology Recent Labs  Lab 03/09/21 0132 03/10/21 0627 03/11/21 0441  WBC 5.9 5.5 4.9  RBC 4.02* 4.06* 4.06*  HGB 13.8 13.7 13.5  HCT  38.4* 37.7* 37.5*  MCV 95.5 92.9 92.4  MCH 34.3* 33.7 33.3  MCHC 35.9 36.3* 36.0  RDW 16.5* 16.2* 15.8*  PLT 163 216 207     BNPNo results for input(s): BNP, PROBNP in the last 168 hours.   DDimer No results for input(s): DDIMER in the last 168 hours.   Radiology    ECHOCARDIOGRAM COMPLETE  Result Date: 03/11/2021    ECHOCARDIOGRAM REPORT   Patient Name:   Darren Rogers Date of Exam: 03/11/2021 Medical Rec #:  [id number]    Height:       66.0 in Accession #:    [id number]   Weight:       156.3 lb Date of Birth:  [birth year]   BSA:          1.801 m Patient Age:    65 years     BP:           137/98 mmHg Patient Gender: M            HR:           103 bpm. Exam Location:  [facility] Procedure: 2D Echo, Color Doppler and Cardiac Doppler Indications:     I48.91 Atrial fibrillation  History:         Patient has prior history of Echocardiogram examinations, most                  recent 08/06/2020. CAD, Arrythmias:Atrial Fibrillation and PVC;                  Risk Factors:Hypertension and Current Smoker.  Sonographer:     Humphrey Rolls Referring  Phys:  [phone] CHRISTOPHER END Diagnosing Phys: Yvonne Kendall MD  Sonographer Comments: Suboptimal subcostal window. IMPRESSIONS  1. Left ventricular ejection fraction, by estimation, is 55 to 60%. The left ventricle has normal function. The left ventricle has no regional wall motion abnormalities. There is mild left ventricular hypertrophy. Left ventricular diastolic function could not be evaluated.  2. Right ventricular systolic function is low normal. The right ventricular size is normal.  3. Left atrial size was mildly dilated.  4. The mitral valve is normal in structure. Mild mitral valve regurgitation. No evidence of mitral stenosis.  5. The aortic valve is tricuspid. There is mild thickening of the aortic valve. Aortic valve regurgitation is trivial.  6. The inferior vena cava is normal in size with greater than 50% respiratory variability, suggesting right atrial pressure of 3 mmHg. FINDINGS  Left Ventricle: Left ventricular ejection fraction, by estimation, is 55 to 60%. The left ventricle has normal function. The left ventricle has no regional wall motion abnormalities. The left ventricular internal cavity size was normal in size. There is  mild left ventricular hypertrophy. Left ventricular diastolic function could not be evaluated due to atrial fibrillation. Left ventricular diastolic function could not be evaluated. Right Ventricle: The right ventricular size is normal. No increase in right ventricular wall thickness. Right ventricular systolic function is low normal. Left Atrium: Left atrial size was mildly dilated. Right Atrium: Right atrial size was normal in size. Pericardium: The pericardium was not well visualized. Mitral Valve: The mitral valve is normal in structure. Mild mitral valve regurgitation. No evidence of mitral valve stenosis. MV peak gradient, 3.4 mmHg. The mean mitral valve gradient is 2.0 mmHg. Tricuspid Valve: The tricuspid valve is normal in structure. Tricuspid valve regurgitation  is mild. Aortic Valve: The aortic valve is tricuspid. There is mild thickening of the aortic valve. Aortic  valve regurgitation is trivial. Aortic valve mean gradient measures 3.0 mmHg. Aortic valve peak gradient measures 4.9 mmHg. Aortic valve area, by VTI measures 3.30 cm. Pulmonic Valve: The pulmonic valve was normal in structure. Pulmonic valve regurgitation is mild. No evidence of pulmonic stenosis. Aorta: The aortic root and ascending aorta are structurally normal, with no evidence of dilitation. Pulmonary Artery: The pulmonary artery is of normal size. Venous: The inferior vena cava is normal in size with greater than 50% respiratory variability, suggesting right atrial pressure of 3 mmHg. IAS/Shunts: The interatrial septum was not well visualized.  LEFT VENTRICLE PLAX 2D LVIDd:         4.14 cm   Diastology LVIDs:         3.29 cm   LV e' medial:    5.66 cm/s LV PW:         1.22 cm   LV E/e' medial:  16.7 LV IVS:        1.23 cm   LV e' lateral:   7.72 cm/s LVOT diam:     2.20 cm   LV E/e' lateral: 12.2 LV SV:         57 LV SV Index:   32 LVOT Area:     3.80 cm  RIGHT VENTRICLE RV Basal diam:  3.07 cm RV S prime:     10.70 cm/s LEFT ATRIUM             Index        RIGHT ATRIUM           Index LA diam:        4.60 cm 2.55 cm/m   RA Area:     16.50 cm LA Vol (A2C):   67.2 ml 37.31 ml/m  RA Volume:   41.90 ml  23.27 ml/m LA Vol (A4C):   65.2 ml 36.20 ml/m LA Biplane Vol: 69.2 ml 38.43 ml/m  AORTIC VALVE                    PULMONIC VALVE AV Area (Vmax):    3.09 cm     PV Vmax:       0.69 m/s AV Area (Vmean):   2.83 cm     PV Vmean:      39.700 cm/s AV Area (VTI):     3.30 cm     PV VTI:        0.080 m AV Vmax:           111.00 cm/s  PV Peak grad:  1.9 mmHg AV Vmean:          78.100 cm/s  PV Mean grad:  1.0 mmHg AV VTI:            0.173 m AV Peak Grad:      4.9 mmHg AV Mean Grad:      3.0 mmHg LVOT Vmax:         90.10 cm/s LVOT Vmean:        58.200 cm/s LVOT VTI:          0.150 m LVOT/AV VTI ratio: 0.87   AORTA Ao Root diam: 3.50 cm MITRAL VALVE MV Area (PHT): 4.31 cm    SHUNTS MV Area VTI:   3.29 cm    Systemic VTI:  0.15 m MV Peak grad:  3.4 mmHg    Systemic Diam: 2.20 cm MV Mean grad:  2.0 mmHg MV Vmax:       0.92 m/s MV Vmean:  60.5 cm/s MV Decel Time: 176 msec MV E velocity: 94.45 cm/s Yvonne Kendall MD Electronically signed by Yvonne Kendall MD Signature Date/Time: 03/11/2021/7:13:21 PM    Final     Cardiac Studies   Echo 07/2020 Left ventricular ejection fraction, by estimation, is 50 to 55%. The left ventricle has low normal function. The left ventricle has no regional wall motion abnormalities. There is mild left ventricular hypertrophy. Left ventricular diastolic parameters are indeterminate. The average left ventricular global longitudinal strain is -8.7 %. The global longitudinal strain is abnormal. 2. Right ventricular systolic function is mildly reduced. The right ventricular size is normal. Moderately increased right ventricular wall thickness. 3. Left atrial size was mildly dilated. 4. The mitral valve is normal in structure. Mild mitral valve regurgitation. No evidence of mitral stenosis. 5. The aortic valve is tricuspid. There is mild thickening of the aortic valve. Aortic valve regurgitation is mild. Mild aortic valve sclerosis is present, with no evidence of aortic valve stenosis. 6. The inferior vena cava is normal in size with greater than 50% respiratory variability, suggesting right atrial pressure of 3 mmHg.   MPI 07/2020 No evidence of significant ischemia or scar.  Normal LV SF by visual estimate.  Calculated LVEF unreliable due to gating issues related to A. fib.  CAC and aortic atherosclerosis noted on attenuation correction CT.  Low risk study.  Patient Profile     65 y.o. male with hx of nonobstructive CAD, persistent atrial fibrillation with history of medication noncompliance, hypertension/hypertensive heart disease, hyperlipidemia, erectile  dysfunction, polysubstance abuse including alcohol and tobacco use, peripheral arterial disease, and who is being seen today for the evaluation of chest pain and bradycardic ventricular rates s/p atropine and now RVR with underlying atrial fibrillation at the request of Dr. Chipper Herb.  Assessment & Plan    Chronic atrial fibrillation with initially slow ventricular rate, followed by rapid ventricular rate --Initially presented with ventricular rate bradycardic due to suspected overmedication vs vasovagal etiology. Ventricular rate RVR s/p atropine. Updated EF 55-60%, NRWMA. Continue rate control  Continue lopressor 100mg  BID.  Consolidate to diltiazem 180mg .   Continue Eliquis 5 mg BID with PPI.   Place Zio XT x 2 weeks before dc'd to exclude tachybrady syndrome. Follow-up in office - will send message to office scheduling team.   Atypical chest pain, suspected 2/2 GERD History of coronary artery disease -- Atypical chest pain triggered and made worse with with laying down / eating / EtOH. Suspect GI etiology.  HS Tn 10 and not consistent with ACS. Previous MPI without evidence of ischemia. Echo EF nl and NRWMA.  Recommend PPI on OAC and for GI symptoms.     Essential hypertension --Continue lopressor and diltiazem. Additional antihypertensives recommended as OP if ongoing BP greater than 130/80.   Addendum: Added Imdur for ongoing elevated BP this AM.   HLD --Statin deferred given LFTs elevated in the setting of ongoing EtOH use. ALT 217. LDL control recommended in the setting of PAD. Discussed with MD. 10mg  daily. Repeat LDL/LFTs in 6-8w.   Polysubstance use --Complete cessation of alcohol and tobacco recommended.   PAD -- Reports bilateral lower extremity pain with ambulation.  Imaging this admission shows extensive aortoiliac atherosclerosis with probable areas of stenosis in the iliac and external iliac arteries bilaterally.  Recommend risk factor modification and  follow-up with Dr. in the clinic for further evaluation.  As above, holding off on statin given elevated LFTs.  Start Zetia 10mg  daily. Continue  Eliquis in lieu of ASA.  For questions or updates, please contact [facility] Please consult [email or web address] for contact info under        Signed, Rogers Alstrom, PA-C  03/12/2021, 10:57 AM

## 2021-03-12 NOTE — Progress Notes (Signed)
Zio XT ordered for PAF with RVR and bradycardic ventricular rate at presentation suspected due to over-medication versus vasovagal etiology.

## 2021-03-12 NOTE — Telephone Encounter (Signed)
   Cardiac Monitor Alert  Date of alert:  03/12/2021   Patient Name: Darren Rogers  DOB: January 30, 1956  MRN: [id number]   [facility] Cardiologist: Lorine Bears, MD  [facility] EP:  None    Monitor Information: Long Term Monitor [ZioXT]  Reason:  Atrial Fibrillation Ordering provider:  Marisue Ivan, PA  Contacted by Sam, with [employer]. She states the patient has his first documented episode of atrial fibrillation today at 1245. Atrial fibrillation episode lasted 60 seconds with rates from 90-135 bpm.  Per Sam, the patient was contacted by [employer]. He stated he was just being discharged from [facility] and was asymptomatic with this event.    Alert Atrial Fibrillation/Flutter This is the 1st alert for this rhythm.  The patient has a hx of Atrial Fibrillation/Flutter.   Anticoagulation medication as of 03/12/2021           apixaban (ELIQUIS) 5 MG TABS tablet Take 1 tablet (5 mg total) by mouth 2 (two) times daily.       Next Cardiology Appointment   Date:  Thursday 04/10/21  Provider:  Terrilee Croak, PA  The patient was contacted today by [employer].  He is asymptomatic. Routing phone note to ordering provider to review/ for further recommendations.      Sherri Rad, RN  03/12/2021 4:20 PM

## 2021-03-12 NOTE — Progress Notes (Signed)
Order to discharge pt home.  Discharge instructions/AVS given to patient and reviewed.  Pt advised to call PCP and/or come back to the hospital if there are any problems. Pt verbalized understanding.    

## 2021-03-12 NOTE — Care Management Important Message (Signed)
Important Message  Patient Details  Name: Darren Rogers MRN: [id number] Date of Birth: 09/30/1955   Medicare Important Message Given:  Yes (spoke with by telephone, explained letter, no additional copy needed)     Corey Harold 03/12/2021, 1:58 PM

## 2021-03-12 NOTE — Progress Notes (Signed)
Patient to discharge home today, taxi voucher provided.   No other discharge needs identified at this time.  [place], [place] [phone]

## 2021-03-13 ENCOUNTER — Telehealth: Payer: Self-pay | Admitting: Physician Assistant

## 2021-03-13 NOTE — Telephone Encounter (Signed)
Cardiac Monitor Alert   Date of alert:  03/13/2021    Patient Name: Darren Rogers  DOB: 16-May-1956  MRN: [id number]    [facility] Cardiologist: Lorine Bears, MD  [facility] EP:  None     Monitor Information:  Long Term Monitor Zio AT LIVE Reason:  Atrial Fibrillation Ordering provider:  Marisue Ivan, PA   Contacted by [employer]. She states the monitor showed Rapid Afib at 0328 with a rate of 182,  and again at 0333 with a rate of 193. Both episodes lasted for 60 seconds.The patient was reported to be asymptomatic.    Alert Rapid Afib   Anticoagulation medication as of 03/13/2021                apixaban (ELIQUIS) 5 MG TABS tablet Take 1 tablet (5 mg total) by mouth 2 (two) times daily.           Next Cardiology Appointment   Date:  Thursday 04/10/21  Provider:  Terrilee Croak, PA

## 2021-03-13 NOTE — Telephone Encounter (Signed)
[employer] calling with abnormal reading

## 2021-03-13 NOTE — Telephone Encounter (Signed)
The patient was discharged on 03/12/21 with a ZIO AT. This is the 2nd notification for atrial fibrillation.  Suspect we will be notified frequently of a-fib episodes. The patient is on anticoagulation.  Will forward to Dr. Maree Erie, PA to see if parameters for a-fib notifications need to be modified and relayed to [employer].

## 2021-03-13 NOTE — Telephone Encounter (Signed)
We should only be notified if atrial fibrillation with rapid ventricular response lasts for more than 1 hour.

## 2021-03-13 NOTE — Telephone Encounter (Signed)
Darren Rogers, [place] [phone])  Sent: Wed March 12, 2021  5:10 PM  To: Jefferey Pica, RN          Message  Noted.  Yes, the patient has known atrial fibrillation, so this is expected.  Thank you for the update.

## 2021-03-13 NOTE — Telephone Encounter (Signed)
Per the ordering provider:    March 12, 2021 Lennon Alstrom, PA-C      5:11 PM  Noted.  Yes, the patient has known atrial fibrillation, so this is expected.  Thank you for the update.

## 2021-03-13 NOTE — Telephone Encounter (Signed)
The patient has been scheduled to follow up with Cadence Furth, PA on 04/10/21 at 11:20 am.  Not indicated by Marisue Ivan, PA that this is a TCM call. To provider to clarify, or if this was sent to triage to notify us of monitor placement.

## 2021-03-14 LAB — CULTURE, BLOOD (ROUTINE X 2)
Culture: NO GROWTH
Special Requests: ADEQUATE

## 2021-03-14 NOTE — Telephone Encounter (Signed)
I reached out to Satira Sark- [employer] account manager and discussed changing the patient's notification parameters for atrial fibrillation as stated per Dr. Kirke Corin.  Per Raynelle Fanning, she will help to arrange this.  I have emailed her the patient's demographics and Dr. Jari Sportsman recommendations.

## 2021-03-14 NOTE — Telephone Encounter (Signed)
Clarified with [name], NP that with time frame of appt being 3-4 weeks, he will not be a TCM call.

## 2021-04-04 NOTE — Addendum Note (Signed)
Encounter addended by: Jefferey Pica, RN on: 04/04/2021 2:58 PM  Actions taken: Imaging Exam ended

## 2021-04-10 ENCOUNTER — Ambulatory Visit: Payer: Medicare Other | Admitting: Medical

## 2021-04-11 ENCOUNTER — Telehealth: Payer: Self-pay | Admitting: Nurse Practitioner

## 2021-04-11 NOTE — Telephone Encounter (Signed)
Attempted to call the patient. No answer- I left a message to please call back.  

## 2021-04-11 NOTE — Telephone Encounter (Signed)
Creig Hines, NP  04/11/2021  2:25 PM EST     Afib w/ avg rate of 99 bpm.  Does get fast at times.  F/u as planned.

## 2021-04-14 NOTE — Telephone Encounter (Signed)
2nd attempt to contact the patient with zio results. Lmtcb.

## 2021-04-15 NOTE — Telephone Encounter (Signed)
3rd attempt to contact the patient with zio results. Will mail a letter for the patient to contact our office for results. Closing this encounter.

## 2021-04-23 NOTE — Telephone Encounter (Signed)
Patient returning call for results 

## 2021-04-23 NOTE — Telephone Encounter (Addendum)
Attempted to call the patient. No answer- I left a detailed message of heart monitor results on his identified voice mail (ok per DPR).  I advised that he should be taking his eliquis as prescribed and to follow up as planned on 05/12/21 at 11:20 am with Darren Fransico Michael, PA.   I asked that he call back with any further questions/ concerns in the interim.

## 2021-04-29 IMAGING — DX DG CHEST 1V PORT
1 series · 1 of 1 positions shown · non-contrast
Comparison: 07/29/2017

CLINICAL DATA: Chest pain.

EXAM:
PORTABLE CHEST 1 VIEW

[chest ap]
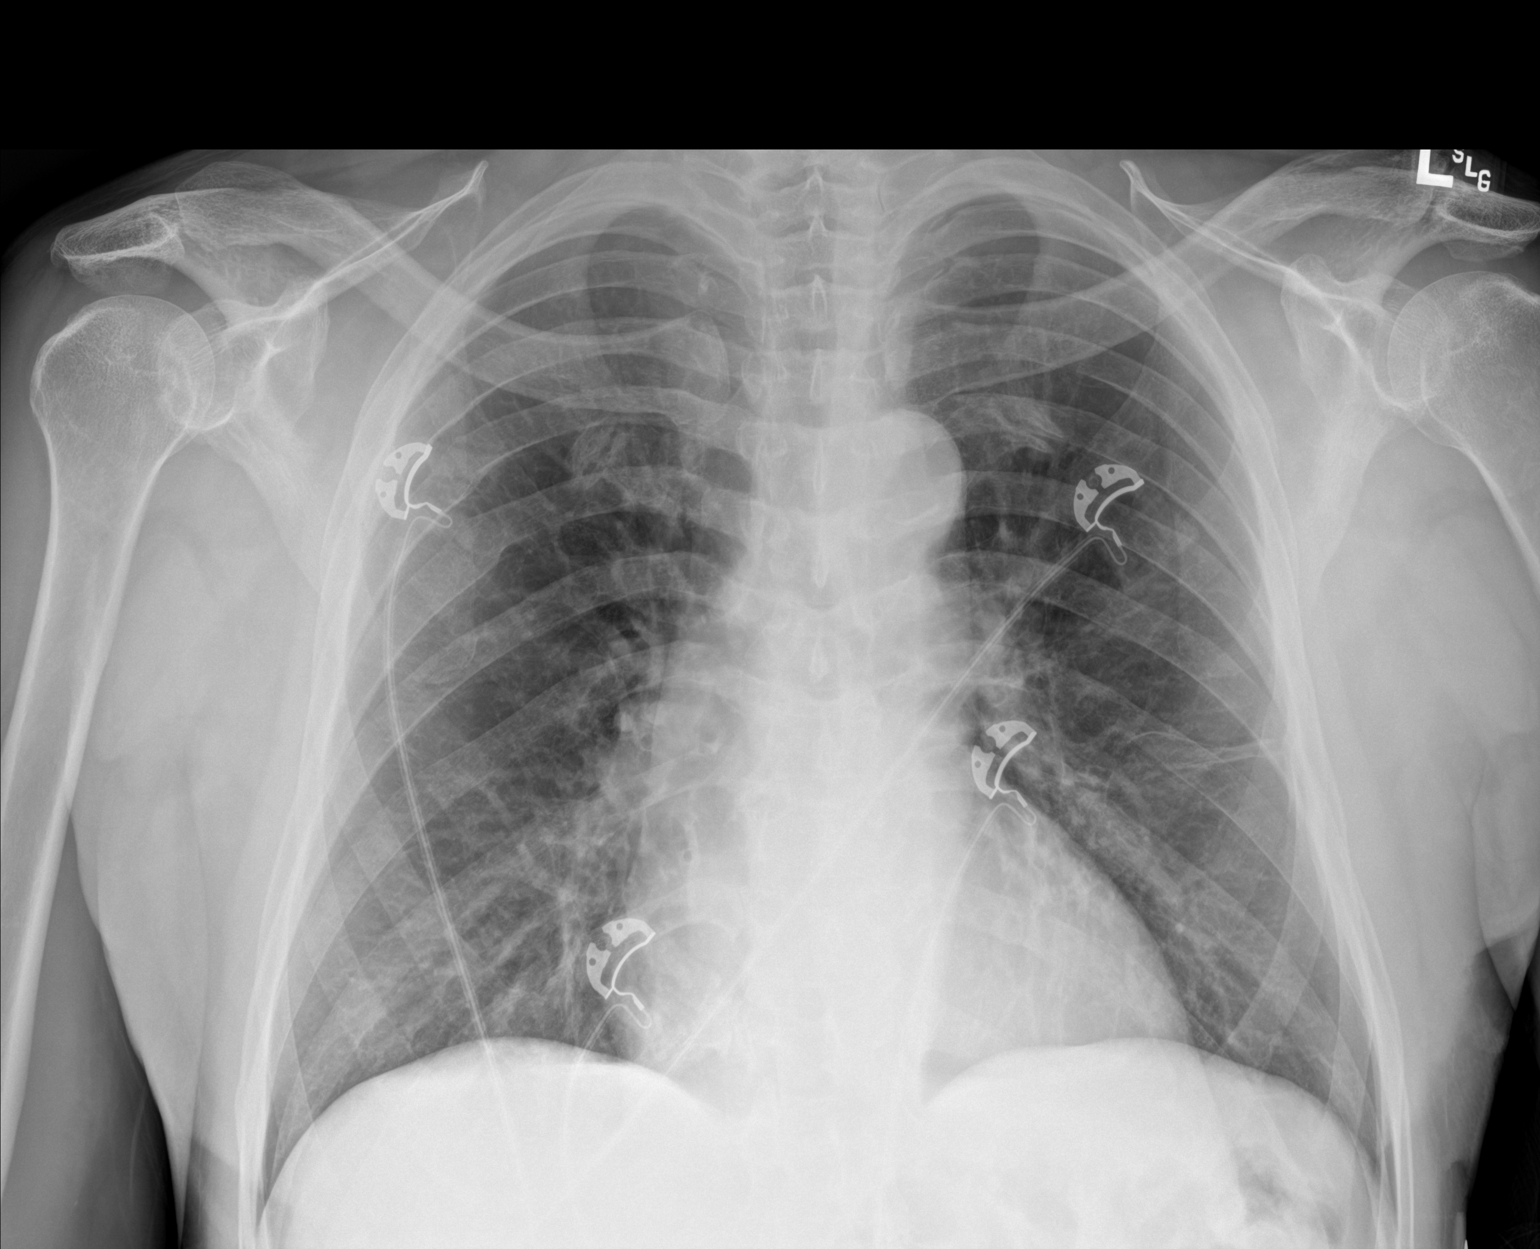

[1 of 1 positions shown; findings below may reference images not displayed]

FINDINGS: 5826 hours. Linear atelectasis or scarring noted left mid lung.
Lungs otherwise clear. Cardiopericardial silhouette is at upper
limits of normal for size. The visualized bony structures of the
thorax are intact. Telemetry leads overlie the chest.
IMPRESSION: No active disease.

## 2021-05-06 ENCOUNTER — Other Ambulatory Visit: Payer: Self-pay | Admitting: Cardiovascular Disease

## 2021-05-07 NOTE — Telephone Encounter (Signed)
The patient was seen by our cardiology team during his October 2022 hospitalization.   He cancelled his 04/10/21 office visit with Cadence, PA due to the flu.  He is scheduled to be seen by Cadence, PA on 05/12/21.  Ok to refill diltiazem.

## 2021-05-07 NOTE — Telephone Encounter (Signed)
Please advise if ok to refill Diltiazem 180 mg capsule. Pt hasn't been seen since 2019. Medications last filled by Lurene Shadow, MD. Pt has future appointment with Cadence Fransico Michael, [place].

## 2021-05-12 ENCOUNTER — Ambulatory Visit: Payer: Medicaid Other | Admitting: Medical

## 2021-05-12 NOTE — Progress Notes (Deleted)
Cardiology Office Note:    Date:  05/12/2021   ID:  Darren Rogers, DOB 1956-02-02, MRN [id number]  PCP:  Patient, No Pcp Per (Inactive)  [facility] Cardiologist:  Darren Sacramento, MD  [place] Electrophysiologist:  None   Referring MD: No ref. provider found   Chief Complaint: Hospital follow-up  History of Present Illness:    Darren Rogers is a 65 y.o. male with a hx of  nonobstructive CAD, persistent atrial fibrillation with history of medication noncompliance, hypertension/hypertensive heart disease, hyperlipidemia, erectile dysfunction, polysubstance abuse including alcohol and tobacco use, peripheral arterial disease, and who is being seen today for the evaluation of hospital follow-up.   Admitted 03/2021 with afib with slow ventricular rate suspected overmedication. S/p atropine. Echo showed EF 55-60%, NRWMA. He had atypical chest pain and Imdur was added. Heart monitor was ordered at d/c. Heart monitor showed 100% afib with rates 52-217bpm.   Today,   Past Medical History:  Diagnosis Date   Claudication ([place])    a. 06/2015 ABI: R - 0.73, L - 0.73. 30-49% bilat SFA stenosis. 50-74% R Profunda stenosis; b. 01/2017 ABI: R 0.89, L 0.88, TBI R 0.65, L 1.0.   Erectile dysfunction    Essential hypertension    History of echocardiogram    a. 03/29/2014: EF 55-60%, mild LVH, normal RVSP;  b. 05/2015 Echo: EF 60-65%, triv AI, mild MR; c. 07/2020 Echo:    Hyperlipidemia    Non-obstructive CAD    a. 01/27/2014 Cath: LM nl, LAD mild diff dz w/o obs, LCx no sig obs - scattered 20-30% mLCx, RCA no obs dz, EF 70%; b. 06/2015 MV; EF 60%, no ischemia.   PAF (paroxysmal atrial fibrillation) ([place])    a. Pt says Dx >77yrs ago w/ ? RFCA @ [facility];  b. Recurrent 12/2013;  b. Rx Flecainide and Xarelto-->Intermittent compliance.   PVC's (premature ventricular contractions)    a. 05/2017 Zio: 4000 PVCs in 48 hrs (2%). Brief run of SVT.   Tobacco abuse    a. ongoing - 1 ppd.    Past Surgical  History:  Procedure Laterality Date   CARDIAC CATHETERIZATION     CARD[facility] CATHETERIZATION     [facility]   LEFT HEART CATH Right 01/27/2014   Procedure: LEFT HEART CATH;  Surgeon: Blane Ohara, MD;  Location: [facility] CATH LAB;  Service: Cardiovascular;  Laterality: Right;    Current Medications: No outpatient medications have been marked as taking for the 05/12/21 encounter (Appointment) with Kathlen Mody, Darren Hazel H, PA-C.     Allergies:   Patient has no known allergies.   Social History   Socioeconomic History   Marital status: Single    Spouse name: Not on file   Number of children: Not on file   Years of education: Not on file   Highest education level: Not on file  Occupational History   Not on file  Tobacco Use   Smoking status: Every Day    Packs/day: 0.50    Years: 30.00    Pack years: 15.00    Types: Cigarettes   Smokeless tobacco: Never  Substance and Sexual Activity   Alcohol use: Yes    Alcohol/week: 12.0 standard drinks    Types: 12 Cans of beer per week   Drug use: Yes    Types: Cocaine   Sexual activity: Not on file  Other Topics Concern   Not on file  Social History Narrative   The patient lives in [place] with his sister. He  works in a substance abuse program. 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 He is a long-time smoker, one pack per day. No illicit drugs. He is not married.   Social Determinants of Health   Financial Resource Strain: Not on file  Food Insecurity: Not on file  Transportation Needs: Not on file  Physical Activity: Not on file  Stress: Not on file  Social Connections: Not on file     Family History: The patient's family history includes Coronary artery disease (age of onset: 74) in his father; Diabetes in his brother, maternal grandmother, mother, sister, sister, and sister.  ROS:   Please see the history of present illness.     All other systems reviewed and are negative.  EKGs/Labs/Other  Studies Reviewed:    The following studies were reviewed today:  Echo 07/2020 Left ventricular ejection fraction, by estimation, is 50 to 55%. The left ventricle has low normal function. The left ventricle has no regional wall motion abnormalities. There is mild left ventricular hypertrophy. Left ventricular diastolic parameters are indeterminate. The average left ventricular global longitudinal strain is -8.7 %. The global longitudinal strain is abnormal. 2. Right ventricular systolic function is mildly reduced. The right ventricular size is normal. Moderately increased right ventricular wall thickness. 3. Left atrial size was mildly dilated. 4. The mitral valve is normal in structure. Mild mitral valve regurgitation. No evidence of mitral stenosis. 5. The aortic valve is tricuspid. There is mild thickening of the aortic valve. Aortic valve regurgitation is mild. Mild aortic valve sclerosis is present, with no evidence of aortic valve stenosis. 6. The inferior vena cava is normal in size with greater than 50% respiratory variability, suggesting right atrial pressure of 3 mmHg.   MPI 07/2020 No evidence of significant ischemia or scar.  Normal LV SF by visual estimate.  Calculated LVEF unreliable due to gating issues related to A. fib.  CAC and aortic atherosclerosis noted on attenuation correction CT.  Low risk study.    EKG:  EKG is *** ordered today.  The ekg ordered today demonstrates ***  Recent Labs: 01/30/2021: B Natriuretic Peptide 252.8 03/10/2021: ALT 217; TSH 1.556 03/11/2021: Hemoglobin 13.5; Platelets 207 03/12/2021: BUN 17; Creatinine, Ser 1.06; Magnesium 2.0; Potassium 4.1; Sodium 134  Recent Lipid Panel    Component Value Date/Time   CHOL 233 (Rogers) 09/10/2020 0607   CHOL 151 11/04/2017 0900   CHOL 234 (Rogers) 12/06/2011 0132   TRIG 145 03/09/2021 0423   TRIG 381 (Rogers) 12/06/2011 0132   HDL 47 09/10/2020 0607   HDL 42 11/04/2017 0900   HDL 34 (L) 12/06/2011 0132    CHOLHDL 5.0 09/10/2020 0607   VLDL 55 (Rogers) 09/10/2020 0607   VLDL 76 (Rogers) 12/06/2011 0132   LDLCALC 131 (Rogers) 09/10/2020 0607   LDLCALC 86 11/04/2017 0900   LDLCALC 124 (Rogers) 12/06/2011 0132     Risk Assessment/Calculations:   {Does this patient have ATRIAL FIBRILLATION?[phone]}   Physical Exam:    VS:  There were no vitals taken for this visit.    Wt Readings from Last 3 Encounters:  03/11/21 156 lb 4.9 oz (70.9 kg)  01/30/21 165 lb 2 oz (74.9 kg)  12/10/20 155 lb (70.3 kg)     GEN: *** Well nourished, well developed in no acute distress HEENT: Normal NECK: No JVD; No carotid bruits LYMPHATICS: No lymphadenopathy CARDIAC: ***RRR, no murmurs, rubs, gallops RESPIRATORY:  Clear to auscultation without rales, wheezing or rhonchi  ABDOMEN:  Soft, non-tender, non-distended MUSCULOSKELETAL:  No edema; No deformity  SKIN: Warm and dry NEUROLOGIC:  Alert and oriented x 3 PSYCHIATRIC:  Normal affect   ASSESSMENT:    No diagnosis found. PLAN:    In order of problems listed above:  Afib Refer to EP  Atypical chest pain  CAD  HTN  HLD  Polysubstance abuse  PAD ABIs  Disposition: Follow up {follow up:15908} with ***   Shared Decision Making/Informed Consent   {Are you ordering a CV Procedure (e.g. stress test, cath, DCCV, TEE, etc)?   Press F2        :[id number]}    Signed, Emmi Wertheim David Stall, PA-C  05/12/2021 7:59 AM    [facility]

## 2021-06-04 ENCOUNTER — Telehealth: Payer: Self-pay | Admitting: Medical

## 2021-06-04 NOTE — Telephone Encounter (Signed)
°*  STAT* If patient is at the pharmacy, call can be transferred to refill team.   1. Which medications need to be refilled? (please list name of each medication and dose if known) metoprolol   2. Which pharmacy/location (including street and city if local pharmacy) is medication to be sent to?[facility]   3. Do they need a 30 day or 90 day supply? 90

## 2021-06-05 MED ORDER — METOPROLOL TARTRATE 100 MG PO TABS: 100.0000 mg | ORAL_TABLET | Freq: Two times a day (BID) | ORAL | 0 refills | Status: DC

## 2021-06-17 NOTE — Progress Notes (Deleted)
Cardiology Office Note:    Date:  06/17/2021   ID:  Darren Rogers, DOB [birth year], MRN [id number]  PCP:  Patient, No Pcp Per (Inactive)  [facility] Cardiologist:  Darren Sacramento, MD  [place] Electrophysiologist:  None   Referring MD: No ref. provider found   Chief Complaint: Hospital follow-up  History of Present Illness:    Darren Rogers is a 66 y.o. male with a hx of with a hx of nonobstructive CAD, persistent atrial fibrillation with history of medication noncompliance, hypertension/hypertensive heart disease, hyperlipidemia, erectile dysfunction, polysubstance abuse including alcohol and tobacco use, peripheral arterial disease, and who is being seen today for hospital follow-up.   He has history of atrial fibrillation, diagnosed more than 20 years ago and intermittent compliance with medical therapy.  2015 LHC with nonobstructive CAD.  07/2020 echo with EF 50 to 55%, NR WMA, mild LVH, mild LAE, mild MR, mild AR.  07/2020 MPI without evidence of ischemia and overall ruled low risk.    Admitted 03/2021 for chest pain and Afib RVR in the setting of medical nonadherence and ongoing alcohol use. He was restarted on rate control with Metoprolol tartrate 50mg  BID/ Rogers/o SB. Suspected GI etiology for chest pain. Also started on cardizem. Echo showed preserved EF. Cardiac monitor was placed to evaluate for tachybrady syndrome.   Heart monitor showed 100% burden Afib  Today,   Past Medical History:  Diagnosis Date   Claudication ([place])    a. 06/2015 ABI: R - 0.73, L - 0.73. 30-49% bilat SFA stenosis. 50-74% R Profunda stenosis; b. 01/2017 ABI: R 0.89, L 0.88, TBI R 0.65, L 1.0.   Erectile dysfunction    Essential hypertension    History of echocardiogram    a. 03/29/2014: EF 55-60%, mild LVH, normal RVSP;  b. 05/2015 Echo: EF 60-65%, triv AI, mild MR; c. 07/2020 Echo:    Hyperlipidemia    Non-obstructive CAD    a. 01/27/2014 Cath: LM nl, LAD mild diff dz w/o obs, LCx no sig obs -  scattered 20-30% mLCx, RCA no obs dz, EF 70%; b. 06/2015 MV; EF 60%, no ischemia.   PAF (paroxysmal atrial fibrillation) ([place])    a. Pt says Dx >8yrs ago w/ ? RFCA @ [facility];  b. Recurrent 12/2013;  b. Rx Flecainide and Xarelto-->Intermittent compliance.   PVC's (premature ventricular contractions)    a. 05/2017 Zio: 4000 PVCs in 48 hrs (2%). Brief run of SVT.   Tobacco abuse    a. ongoing - 1 ppd.    Past Surgical History:  Procedure Laterality Date   CARDIAC CATHETERIZATION     CARD[facility] CATHETERIZATION     [facility]   LEFT HEART CATH Right 01/27/2014   Procedure: LEFT HEART CATH;  Surgeon: Darren Ohara, MD;  Location: [facility] C[facility] LAB;  Service: Cardiovascular;  Laterality: Right;    Current Medications: No outpatient medications have been marked as taking for the 06/20/21 encounter (Appointment) with Darren Rogers, Darren Basara H, PA-C.     Allergies:   Patient has no known allergies.   Social History   Socioeconomic History   Marital status: Single    Spouse name: Not on file   Number of children: Not on file   Years of education: Not on file   Highest education level: Not on file  Occupational History   Not on file  Tobacco Use   Smoking status: Every Day    Packs/day: 0.50    Years: 30.00    Pack years: 15.00  Types: Cigarettes   Smokeless tobacco: Never  Substance and Sexual Activity   Alcohol use: Yes    Alcohol/week: 12.0 standard drinks    Types: 12 Cans of beer per week   Drug use: Yes    Types: Cocaine   Sexual activity: Not on file  Other Topics Concern   Not on file  Social History Narrative   The patient lives in [place] with his sister. He works in a substance abuse program. 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 He is a long-time smoker, one pack per day. No illicit drugs. He is not married.   Social Determinants of Health   Financial Resource Strain: Not on file  Food Insecurity: Not on file  Transportation Needs: Not on  file  Physical Activity: Not on file  Stress: Not on file  Social Connections: Not on file     Family History: The patient's family history includes Coronary artery disease (age of onset: 71) in his father; Diabetes in his brother, maternal grandmother, mother, sister, sister, and sister.  ROS:   Please see the history of present illness.     All other systems reviewed and are negative.  EKGs/Labs/Other Studies Reviewed:    The following studies were reviewed today:  Heart monitor 04/2021 Patch Wear Time:  14 days and 0 hours (2022-10-12T12:34:30-0400 to 2022-10-26T12:34:30-0400)   Atrial Fibrillation occurred continuously (100% burden), ranging from 52-217 bpm (avg of 99 bpm). Isolated VEs were rare (<1.0%, 12790), VE Couplets were rare (<1.0%, 113), and VE Triplets were rare (<1.0%, 10). Previously notified: MD notification   Echo 03/2021  1. Left ventricular ejection fraction, by estimation, is 55 to 60%. The  left ventricle has normal function. The left ventricle has no regional  wall motion abnormalities. There is mild left ventricular hypertrophy.  Left ventricular diastolic function  could not be evaluated.   2. Right ventricular systolic function is low normal. The right  ventricular size is normal.   3. Left atrial size was mildly dilated.   4. The mitral valve is normal in structure. Mild mitral valve  regurgitation. No evidence of mitral stenosis.   5. The aortic valve is tricuspid. There is mild thickening of the aortic  valve. Aortic valve regurgitation is trivial.   6. The inferior vena cava is normal in size with greater than 50%  respiratory variability, suggesting right atrial pressure of 3 mmHg.  Stress test 07/2020   Narrative & Impression  Normal pharmacologic myocardial perfusion stress test without evidence of significant ischemia or scar. Grossly normal left ventricular systolic function by visual estimation. Calculated LVEF is unreliable due to gating  issues related to atrial fibrillation. Coronary artery calcification and aortic atherosclerosis are noted on the attenuation correction CT. This is a low risk study.    EKG:  EKG is *** ordered today.  The ekg ordered today demonstrates ***  Recent Labs: 01/30/2021: B Natriuretic Peptide 252.8 03/10/2021: ALT 217; TSH 1.556 03/11/2021: Hemoglobin 13.5; Platelets 207 03/12/2021: BUN 17; Creatinine, Ser 1.06; Magnesium 2.0; Potassium 4.1; Sodium 134  Recent Lipid Panel    Component Value Date/Time   CHOL 233 (Rogers) 09/10/2020 0607   CHOL 151 11/04/2017 0900   CHOL 234 (Rogers) 12/06/2011 0132   TRIG 145 03/09/2021 0423   TRIG 381 (Rogers) 12/06/2011 0132   HDL 47 09/10/2020 0607   HDL 42 11/04/2017 0900   HDL 34 (L) 12/06/2011 0132   CHOLHDL 5.0 09/10/2020 [id number]  VLDL 55 (Rogers) 09/10/2020 0607   VLDL 76 (Rogers) 12/06/2011 0132   LDLCALC 131 (Rogers) 09/10/2020 0607   LDLCALC 86 11/04/2017 0900   LDLCALC 124 (Rogers) 12/06/2011 0132     Risk Assessment/Calculations:   {Does this patient have ATRIAL FIBRILLATION?:[phone]}   Physical Exam:    VS:  There were no vitals taken for this visit.    Wt Readings from Last 3 Encounters:  03/11/21 156 lb 4.9 oz (70.9 kg)  01/30/21 165 lb 2 oz (74.9 kg)  12/10/20 155 lb (70.3 kg)     GEN: *** Well nourished, well developed in no acute distress HEENT: Normal NECK: No JVD; No carotid bruits LYMPHATICS: No lymphadenopathy CARDIAC: ***RRR, no murmurs, rubs, gallops RESPIRATORY:  Clear to auscultation without rales, wheezing or rhonchi  ABDOMEN: Soft, non-tender, non-distended MUSCULOSKELETAL:  No edema; No deformity  SKIN: Warm and dry NEUROLOGIC:  Alert and oriented x 3 PSYCHIATRIC:  Normal affect   ASSESSMENT:    No diagnosis found. PLAN:    In order of problems listed above:  Persistent Afib  Atypical chest pain Rogers/o CAD  HTN  HLD  Polysubstance use  PAD    Disposition: Follow up {follow up:15908} with ***   Shared Decision  Making/Informed Consent   {Are you ordering a CV Procedure (e.g. stress test, cath, DCCV, TEE, etc)?   Press F2        :[id number]    Signed, Nohea Kras Ninfa Meeker, PA-C  06/17/2021 3:04 PM    [facility]

## 2021-06-20 ENCOUNTER — Ambulatory Visit: Payer: Medicare Other | Admitting: Medical

## 2021-06-23 ENCOUNTER — Encounter: Payer: Self-pay | Admitting: Medical

## 2021-08-01 ENCOUNTER — Other Ambulatory Visit: Payer: Self-pay | Admitting: Medical

## 2021-08-01 NOTE — Telephone Encounter (Signed)
?*  STAT* If patient is at the pharmacy, call can be transferred to refill team. ? ? ?1. Which medications need to be refilled? (please list name of each medication and dose if known) metoprolol diltiazem and eliquis  ? ?2. Which pharmacy/location (including street and city if local pharmacy) is medication to be sent to?[facility]  ? ?3. Do they need a 30 day or 90 day supply? 90 ? ?Scheduled 3/16 arida ? ?

## 2021-08-14 ENCOUNTER — Ambulatory Visit: Payer: Medicare Other | Admitting: Cardiovascular Disease

## 2021-08-14 MED ORDER — APIXABAN 5 MG PO TABS: 5.0000 mg | ORAL_TABLET | Freq: Two times a day (BID) | ORAL | 0 refills | Status: DC

## 2021-08-14 NOTE — Telephone Encounter (Signed)
*  STAT* If patient is at the pharmacy, call can be transferred to refill team.   1. Which medications need to be refilled? (please list name of each medication and dose if known) Eliquis  2. Which pharmacy/location (including street and city if local pharmacy) is medication to be sent to? [facility]   3. Do they need a 30 day or 90 day supply? 30  Patient states he has been out for 2 weeks.

## 2021-08-14 NOTE — Telephone Encounter (Signed)
Please review

## 2021-08-14 NOTE — Addendum Note (Signed)
Addended by: Louanna Raw on: 08/14/2021 09:56 AM ? ? Modules accepted: Orders ? ?

## 2021-08-14 NOTE — Progress Notes (Deleted)
?  ?Cardiology Office Note ? ? ?Date:  08/14/2021  ? ?ID:  Darren Rogers, DOB [birth year], MRN [id number] ? ?PCP:  Patient, No Pcp Per (Inactive)  ?Cardiologist:   Kathlyn Sacramento, MD  ? ?No chief complaint on file. ? ? ?  ?History of Present Illness: ?Darren Rogers is a 66 y.o. male who presents for a follow-up visit regarding paroxysmal atrial fibrillation and peripheral arterial disease. ?He has known history of essential hypertension, hypertensive heart disease with highly abnormal EKG, peripheral arterial disease and tobacco use. ?Previous cardiac catheterization in August 2015 showed mild nonobstructive coronary artery disease. Atrial fibrillation has been well-controlled with flecainide.  ?He is known to have peripheral arterial disease with mildly reduced ABI bilaterally. Duplex showed diffuse nonobstructive disease. ? ?He had an episode of atrial fibrillation with rapid ventricular response in February which required an emergency room visit.  There was some compliance issues with the evening dose of flecainide and metoprolol. ? ?He has been doing well overall and reports improvement in symptoms.  No recent chest pain.  He describes brief palpitations but no prolonged tachycardia.  Shortness of breath is stable.  He reports that his leg pain with walking is also stable.  Usually it happens if he is walking uphill after about 1000 feet.  He describes pain in the anterior thighs and calfs. ? ?He continues to smoke half a pack per day.  He has not been taking Xarelto because he does not like blood thinners. ? ?Past Medical History:  ?Diagnosis Date  ? Claudication [facility]   ? a. 06/2015 ABI: R - 0.73, L - 0.73. 30-49% bilat SFA stenosis. 50-74% R Profunda stenosis; b. 01/2017 ABI: R 0.89, L 0.88, TBI R 0.65, L 1.0.  ? Erectile dysfunction   ? Essential hypertension   ? History of echocardiogram   ? a. 03/29/2014: EF 55-60%, mild LVH, normal RVSP;  b. 05/2015 Echo: EF 60-65%, triv AI, mild MR; c. 07/2020 Echo:   ?  Hyperlipidemia   ? Non-obstructive CAD   ? a. 01/27/2014 Cath: LM nl, LAD mild diff dz w/o obs, LCx no sig obs - scattered 20-30% mLCx, RCA no obs dz, EF 70%; b. 06/2015 MV; EF 60%, no ischemia.  ? PAF (paroxysmal atrial fibrillation) ([place])   ? a. Pt says Dx >46yrs ago w/ ? RFCA @ [facility];  b. Recurrent 12/2013;  b. Rx Flecainide and Xarelto-->Intermittent compliance.  ? PVC's (premature ventricular contractions)   ? a. 05/2017 Zio: 4000 PVCs in 48 hrs (2%). Brief run of SVT.  ? Tobacco abuse   ? a. ongoing - 1 ppd.  ? ? ?Past Surgical History:  ?Procedure Laterality Date  ? CARDIAC CATHETERIZATION    ? CARDIAC CATHETERIZATION    ? [facility]  ? LEFT HEART CATH Right 01/27/2014  ? Procedure: LEFT HEART CATH;  Surgeon: Blane Ohara, MD;  [facility]: [facility] C[facility] LAB;  Service: Cardiovascular;  Laterality: Right;  ? ? ? ?Current Outpatient Medications  ?Medication Sig Dispense Refill  ? amoxicillin (AMOXIL) 500 MG capsule Take 500 mg by mouth as needed. (Patient not taking: Reported on 01/30/2021)    ? apixaban (ELIQUIS) 5 MG TABS tablet Take 1 tablet (5 mg total) by mouth 2 (two) times daily. 180 tablet 0  ? diltiazem (CARDIZEM CD) 180 MG 24 hr capsule TAKE 1 CAPSULE BY MOUTH DAILY 30 capsule 0  ? ezetimibe (ZETIA) 10 MG tablet Take 1 tablet (10 mg total) by mouth daily. 30 tablet 0  ? isosorbide  mononitrate (IMDUR) 30 MG 24 hr tablet Take 0.5 tablets (15 mg total) by mouth daily. 30 tablet 0  ? metoprolol tartrate (LOPRESSOR) 100 MG tablet TAKE 1 TABLET BY MOUTH 2 TIMES DAILY 60 tablet 0  ? ?No current facility-administered medications for this visit.  ? ? ?Allergies:   Patient has no known allergies.  ? ? ?Social History:  The patient  reports that he has been smoking cigarettes. He has a 15.00 pack-year smoking history. He has never used smokeless tobacco. He reports current alcohol use of about 12.0 standard drinks per week. He reports current drug use. Drug: Cocaine.  ? ?Family History:  The patient's family history includes  Coronary artery disease (age of onset: 28) in his father; Diabetes in his brother, maternal grandmother, mother, sister, sister, and sister.  ? ? ? ? ?PHYSICAL EXAM: ?VS:  There were no vitals taken for this visit. , BMI There is no height or weight on file to calculate BMI. ?GEN: Well nourished, well developed, in no acute distress  ?HEENT: normal  ?Neck: no JVD, carotid bruits, or masses ?Cardiac: RRR; no  rubs, or gallops,no edema . There is a 2/6 systolic ejection murmur At the base ?Respiratory:  clear to auscultation bilaterally, normal work of breathing ?GI: soft, nontender, nondistended, + BS ?MS: no deformity or atrophy  ?Skin: warm and dry, no rash ?Neuro:  Strength and sensation are intact ?Psych: euthymic mood, full affect ?Vascular: Femoral pulses are normal bilaterally. Distal pulses are faint but palpable. ? ? ?EKG:  EKG is ordered today. ?The ekg ordered today demonstrates normal sinus rhythm with minimal LVH.  Chronic anterolateral ST elevation and T wave inversion from V3 to V6. ? ?Recent Labs: ?01/30/2021: B Natriuretic Peptide 252.8 ?03/10/2021: ALT 217; TSH 1.556 ?03/11/2021: Hemoglobin 13.5; Platelets 207 ?03/12/2021: BUN 17; Creatinine, Ser 1.06; Magnesium 2.0; Potassium 4.1; Sodium 134  ? ? ?Lipid Panel ?   ?Component Value Date/Time  ? CHOL 233 (H) 09/10/2020 [id number]  ? CHOL 151 11/04/2017 0900  ? CHOL 234 (H) 12/06/2011 0132  ? TRIG 145 03/09/2021 0423  ? TRIG 381 (H) 12/06/2011 0132  ? HDL 47 09/10/2020 0607  ? HDL 42 11/04/2017 0900  ? HDL 34 (L) 12/06/2011 0132  ? CHOLHDL 5.0 09/10/2020 0607  ? VLDL 55 (H) 09/10/2020 [id number]  ? VLDL 76 (H) 12/06/2011 0132  ? [place] 131 (H) 09/10/2020 [id number]  ? LDLCALC 86 11/04/2017 0900  ? [place] 124 (H) 12/06/2011 0132  ? ?  ? ?Wt Readings from Last 3 Encounters:  ?03/11/21 156 lb 4.9 oz (70.9 kg)  ?01/30/21 165 lb 2 oz (74.9 kg)  ?12/10/20 155 lb (70.3 kg)  ?  ? ? ? ?ASSESSMENT AND PLAN: ? ?1.  Paroxysmal atrial fibrillation :  He is maintaining in sinus  rhythm with flecainide and metoprolol with no side effects.  CHADS VASc score is 2 (HTN and PAD).  He has not been taking Xarelto in spite of multiple discussions with him in the past.  He does not want to be on a blood thinner at the present time.  Continue aspirin for now. ? ?2. Chest pain: He has history of chronic atypical chest pain with no previous obstructive disease on prior cardiac catheterization.  He reports no recent chest pain. ? ?3. Essential hypertension: Blood pressure is controlled on metoprolol. ? ?4. Tobacco use: discussed with him the importance of smoking cessation.  ? ?5. Hyperlipidemia:  Currently on rosuvastatin 20 mg daily.  Check fasting lipid  and liver profile today. ? ?6. Peripheral arterial disease: Stable mild claudication with known mildly reduced ABI.  Continue to treat medically for now and consider repeat Doppler later this year. ? ? ?Disposition:   FU with me in 6 months ? ?Signed, ? ?Kathlyn Sacramento, MD  ?08/14/2021 8:10 AM    ?[place] ?

## 2021-08-14 NOTE — Addendum Note (Signed)
Addended by: Dalia Heading on: 08/14/2021 08:30 AM   Modules accepted: Orders

## 2021-08-14 NOTE — Telephone Encounter (Signed)
Prescription refill request for Eliquis received. ?Indication: Atrial fib ?Last office visit: 09/24/20 Hospital  C End MD ?Scr: 1.06 on 03/12/21 ?Age: 66 ?Weight: 72.6kg ? ?Based on above findings Eliquis 5mg  twice daily is the appropriate dose.  Pt is past due for MD appt.  Has one scheduled on 08/26/21 with 08/28/21 NP.  Refill approved x 1 only. ? ?

## 2021-08-26 ENCOUNTER — Ambulatory Visit (INDEPENDENT_AMBULATORY_CARE_PROVIDER_SITE_OTHER): Payer: Medicare Other | Admitting: Nurse Practitioner

## 2021-08-26 ENCOUNTER — Encounter: Payer: Self-pay | Admitting: Nurse Practitioner

## 2021-08-26 ENCOUNTER — Telehealth: Payer: Self-pay | Admitting: Nurse Practitioner

## 2021-08-26 ENCOUNTER — Other Ambulatory Visit: Payer: Self-pay

## 2021-08-26 VITALS — BP 130/90 | HR 125 | Ht 66.0 in | Wt 163.0 lb

## 2021-08-26 DIAGNOSIS — I480 Paroxysmal atrial fibrillation: Secondary | ICD-10-CM | POA: Diagnosis not present

## 2021-08-26 DIAGNOSIS — Z720 Tobacco use: Secondary | ICD-10-CM

## 2021-08-26 DIAGNOSIS — I2510 Atherosclerotic heart disease of native coronary artery without angina pectoris: Secondary | ICD-10-CM | POA: Diagnosis not present

## 2021-08-26 DIAGNOSIS — F1010 Alcohol abuse, uncomplicated: Secondary | ICD-10-CM

## 2021-08-26 DIAGNOSIS — K219 Gastro-esophageal reflux disease without esophagitis: Secondary | ICD-10-CM

## 2021-08-26 DIAGNOSIS — Z91148 Patient's other noncompliance with medication regimen for other reason: Secondary | ICD-10-CM

## 2021-08-26 DIAGNOSIS — I739 Peripheral vascular disease, unspecified: Secondary | ICD-10-CM | POA: Diagnosis not present

## 2021-08-26 DIAGNOSIS — E782 Mixed hyperlipidemia: Secondary | ICD-10-CM

## 2021-08-26 DIAGNOSIS — Z9114 Patient's other noncompliance with medication regimen: Secondary | ICD-10-CM

## 2021-08-26 MED ORDER — PANTOPRAZOLE SODIUM 40 MG PO TBEC: 40.0000 mg | DELAYED_RELEASE_TABLET | Freq: Every day | ORAL | 3 refills | Status: DC

## 2021-08-26 MED ORDER — PANTOPRAZOLE SODIUM 40 MG PO TBEC: 40.0000 mg | DELAYED_RELEASE_TABLET | Freq: Every day | ORAL | 11 refills | Status: DC

## 2021-08-26 MED ORDER — DILTIAZEM HCL ER COATED BEADS 180 MG PO CP24: 180.0000 mg | ORAL_CAPSULE | Freq: Every day | ORAL | 3 refills | Status: DC

## 2021-08-26 MED ORDER — METOPROLOL TARTRATE 100 MG PO TABS: 100.0000 mg | ORAL_TABLET | Freq: Two times a day (BID) | ORAL | 3 refills | Status: DC

## 2021-08-26 MED ORDER — APIXABAN 5 MG PO TABS: 5.0000 mg | ORAL_TABLET | Freq: Two times a day (BID) | ORAL | 11 refills | Status: DC

## 2021-08-26 MED ORDER — FAMOTIDINE 20 MG PO TABS: 20.0000 mg | ORAL_TABLET | Freq: Two times a day (BID) | ORAL | 3 refills | Status: DC

## 2021-08-26 NOTE — Patient Instructions (Addendum)
Medication Instructions:  ?Your physician has recommended you make the following change in your medication:  ? ?START Protonix 40 mg once a day ? ? ?*If you need a refill on your cardiac medications before your next appointment, please call your pharmacy* ? ? ?Lab Work: ?CBC, CMET, and TSH today ? ?If you have labs (blood work) drawn today and your tests are completely normal, you will receive your results only by: ?MyChart Message (if you have MyChart) OR ?A paper copy in the mail ?If you have any lab test that is abnormal or we need to change your treatment, we will call you to review the results. ? ? ?Testing/Procedures: ?Your physician has requested that you have an ankle brachial index (ABI). During this test an ultrasound and blood pressure cuff are used to evaluate the arteries that supply the arms and legs with blood. Allow thirty minutes for this exam. There are no restrictions or special instructions. ? ? ?Your physician has requested that you have a lower or upper extremity arterial duplex. This test is an ultrasound of the arteries in the legs or arms. It looks at arterial blood flow in the legs and arms. Allow one hour for Lower and Upper Arterial scans. There are no restrictions or special instructions ? ? ? ?Follow-Up: ?At [facility], you and your health needs are our priority.  As part of our continuing mission to provide you with exceptional heart care, we have created designated Provider Care Teams.  These Care Teams include your primary Cardiologist (physician) and Advanced Practice Providers (APPs -  Physician Assistants and Nurse Practitioners) who all work together to provide you with the care you need, when you need it. ? ?We recommend signing up for the patient portal called "MyChart".  Sign up information is provided on this After Visit Summary.  MyChart is used to connect with patients for Virtual Visits (Telemedicine).  Patients are able to view lab/test results, encounter notes,  upcoming appointments, etc.  Non-urgent messages can be sent to your provider as well.   ?To learn more about what you can do with MyChart, go to [email or web address].   ? ?Your next appointment:   ?3 month(s) ? ?The format for your next appointment:   ?In Person ? ?Provider:   ?Kathlyn Sacramento, MD or Murray Hodgkins, NP ?

## 2021-08-26 NOTE — Progress Notes (Signed)
Called pharmacy and reviewed change from protonix to pepcid. She will update patient of this change when he picks them up. ?

## 2021-08-26 NOTE — Telephone Encounter (Signed)
Left voicemail message for patient to call back to discuss medication recommendations.  ? ?Insurance would not approve several types of acid reflux medications. Discussed with provider and recommendations are for him to take Pepcid AC 20 mg twice a day over the counter.  ? ?Called patients pharmacy [facility] to see about goodrx options. They do not honor discount cards. I inquired about cost of this medication out of pocket and she states it would be $9.00. Requested that she fill and provide him the cash option. She did not need new prescription but did reenter for medication list to reflect current meds.  ? ? ?

## 2021-08-26 NOTE — Progress Notes (Signed)
? ? ?Office Visit  ?  ?Patient Name: Darren Rogers ?Date of Encounter: 08/26/2021 ? ?Primary Care Provider:  Patient, No Pcp Per (Inactive) ?Primary Cardiologist:  Kathlyn Sacramento, MD ? ?Chief Complaint  ?  ?66 year old male with a history of persistent atrial fibrillation, nonobstructive CAD, hypertension, hyperlipidemia, noncompliance, and polysubstance abuse, who presents for follow-up related to persistent atrial fibrillation. ? ?Past Medical History  ?  ?Past Medical History:  ?Diagnosis Date  ? Claudication [facility])   ? a. 06/2015 ABI: R - 0.73, L - 0.73. 30-49% bilat SFA stenosis. 50-74% R Profunda stenosis; b. 01/2017 ABI: R 0.89, L 0.88, TBI R 0.65, L 1.0.  ? Erectile dysfunction   ? Essential hypertension   ? History of echocardiogram   ? a. 03/29/2014: EF 55-60%, mild LVH, normal RVSP;  b. 05/2015 Echo: EF 60-65%, triv AI, mild MR; c. 03/2021 Echo: EF 55-60%, no rwma, mild LVH, nl RV fxn, mildly dil LA, mild MR.  ? Hyperlipidemia   ? Non-obstructive CAD   ? a. 01/27/2014 Cath: LM nl, LAD mild diff dz w/o obs, LCX no sig obs - scattered 20-30% mLCx, RCA no obs dz, EF 70%; b. 06/2015 MV; EF 60%, no ischemia; c. 07/2020 MV: No ischemia/scar.  ? Persistent atrial fibrillation ([place])   ? a. Pt says Dx >49yrs ago w/ ? RFCA @ [facility];  b. Recurrent 12/2013;  b. Rx Flecainide and Xarelto-->Intermittent compliance; c. 03/2021 Zio: 100% Afib w/ avg rate of 99 bpm. Rare PVCs.  ? PVC's (premature ventricular contractions)   ? a. 05/2017 Zio: 4000 PVCs in 48 hrs (2%). Brief run of SVT.  ? Tobacco abuse   ? a. ongoing - 1 ppd.  ? ?Past Surgical History:  ?Procedure Laterality Date  ? CARDIAC CATHETERIZATION    ? CARDIAC CATHETERIZATION    ? [facility]  ? LEFT HEART CATH Right 01/27/2014  ? Procedure: LEFT HEART CATH;  Surgeon: Blane Ohara, MD;  Location: [facility] CATH LAB;  Service: Cardiovascular;  Laterality: Right;  ? ? ?Allergies ? ?No Known Allergies ? ?History of Present Illness  ?  ?66 year old male with the above complex past medical  history including persistent atrial fibrillation, nonobstructive CAD, hypertension, hyperlipidemia, noncompliance, and polysubstance abuse.  He was previously diagnosed with atrial fibrillation greater than 20 years ago and reports history of some type of ablation at [facility].  He had recurrent atrial fibrillation in August 2015, and in that setting, experience chest pain.  He underwent diagnostic catheterization which showed mild, nonobstructive CAD.  A-fib was previously managed with flecainide and Xarelto however, compliance was poor.  In January 2017, he had recurrent chest pain and atrial fibrillation and underwent stress testing which was nonischemic.  He also reported lower extremity claudication and underwent ABIs, which were abnormal with suggestion of mild to moderate bilateral SFA and right profunda stenoses.  He had recurrent atrial fibrillation in August 2018 in the setting of noncompliance and was resumed on metoprolol, flecainide, and Xarelto however, was lost to follow-up. ? ?He in March 2022, April 2022, and most recently in October 2022, in the setting of rapid atrial fibrillation and atypical chest pain in the setting of noncompliance.  It was felt that A-fib was likely transitioning to permanent and a ZIO monitor was placed at discharge.  This subsequently showed 100% atrial fibrillation burden with an average heart rate of 92 bpm.  Patient did not follow-up after that hospitalization.  He presents today because he is in need of refills on  his medications.  He admits to ongoing intermittent noncompliance.  He continues to drink a case of beer a week, and is smoking about a half a pack of cigarettes a day.  He has not been experiencing palpitations but sometimes notes indigestion, especially after eating and then lying down.  This improves with sitting up.  He has chronic dyspnea on exertion and in addition to this, he experiences right lower extremity claudication when walking to his mailbox, which  is about 1000 feet from his home.  He does not experience exertional chest pain.  He denies PND, orthopnea, dizziness, syncope, edema, or early satiety ? ?Home Medications  ?  ?Prior to Admission medications   ?Medication Sig Start Date End Date Taking? Authorizing Provider  ?famotidine (PEPCID) 20 MG tablet Take 1 tablet (20 mg total) by mouth 2 (two) times daily. 08/26/21  Yes Theora Gianotti, NP  ?apixaban (ELIQUIS) 5 MG TABS tablet Take 1 tablet (5 mg total) by mouth 2 (two) times daily. 08/26/21 11/24/21  Theora Gianotti, NP  ?diltiazem (CARDIZEM CD) 180 MG 24 hr capsule Take 1 capsule (180 mg total) by mouth daily. 08/26/21   Theora Gianotti, NP  ?metoprolol tartrate (LOPRESSOR) 100 MG tablet Take 1 tablet (100 mg total) by mouth 2 (two) times daily. 08/26/21   Theora Gianotti, NP  ?  ?  ? ?Review of Systems  ?  ?Chronic dyspnea on exertion.  Ongoing tobacco and alcohol use.  Ongoing right lower extremity claudication.  He also has back pain and right lower leg numbness when he stands for prolonged periods of time-seeing chiropractor.  Experiences indigestion if he eats and then lays down too quickly.  Denies palpitations, PND, orthopnea, dizziness, syncope, edema, or early satiety.  All other systems reviewed and are otherwise negative except as noted above. ?  ? ?Physical Exam  ?  ?VS:  BP 130/90 (BP Location: Left Arm, Patient Position: Sitting, Cuff Size: Normal)   Pulse (!) 125   Ht 5\' 6"  (1.676 m)   Wt 163 lb (73.9 kg)   SpO2 98%   BMI 26.31 kg/m?  , BMI Body mass index is 26.31 kg/m?. ?    ?GEN: Well nourished, well developed, in no acute distress. ?HEENT: normal. ?Neck: Supple, no JVD, carotid bruits, or masses. ?Cardiac: Irregularly irregular, no murmurs, rubs, or gallops. No clubbing, cyanosis, edema.  Radials 2+/PT 1+ and equal bilaterally.  ?Respiratory:  Respirations regular and unlabored, clear to auscultation bilaterally. ?GI: Soft, nontender,  nondistended, BS + x 4. ?MS: no deformity or atrophy. ?Skin: warm and dry, no rash. ?Neuro:  Strength and sensation are intact. ?Psych: Normal affect. ? ?Accessory Clinical Findings  ?  ?ECG personally reviewed by me today -atrial fibrillation, 125, inferolateral ST and T abnormalities- no acute changes. ? ?Lab Results  ?Component Value Date  ? WBC 4.9 03/11/2021  ? HGB 13.5 03/11/2021  ? HCT 37.5 (L) 03/11/2021  ? MCV 92.4 03/11/2021  ? PLT 207 03/11/2021  ? ?Lab Results  ?Component Value Date  ? CREATININE 1.06 03/12/2021  ? BUN 17 03/12/2021  ? NA 134 (L) 03/12/2021  ? K 4.1 03/12/2021  ? CL 103 03/12/2021  ? CO2 22 03/12/2021  ? ?Lab Results  ?Component Value Date  ? ALT 217 (H) 03/10/2021  ? AST 283 (H) 03/10/2021  ? ALKPHOS 80 03/10/2021  ? BILITOT 1.0 03/10/2021  ? ?Lab Results  ?Component Value Date  ? CHOL 233 (H) 09/10/2020  ? HDL 47  09/10/2020  ? LDLCALC 131 (H) 09/10/2020  ? TRIG 145 03/09/2021  ? CHOLHDL 5.0 09/10/2020  ?  ?Lab Results  ?Component Value Date  ? HGBA1C 6.1 07/30/2016  ? ? ?Assessment & Plan  ?  ?1.  Persistent atrial fibrillation: Likely permanent at this point.  Zio monitoring in October showed 100% A-fib burden.  Patient is generally asymptomatic at which time he is admitted, he is noted to be tachycardic in the setting of noncompliance.  He is currently on metoprolol and diltiazem and admits to ongoing intermittent compliance.  He is out of these medications currently and I will refill today.  He continues to drink at least a case of beer weekly and sometimes drinks several 40 ounce beers in a day.  We discussed the importance of alcohol cessation in the management of atrial fibrillation as well as the long-term impacts of alcoholism and uncontrolled tachycardia.  He has been intermittently compliant with Eliquis, which will be refilled after labs today.  I am following up a complete metabolic panel, TSH, and CBC today. ? ?2.  Atypical chest pain/nonobstructive CAD: Patient with a  history of atypical chest pain on multiple admissions.  Nonobstructive disease on catheterization August 2015 with negative Myoview's in 2017 and March 2022.  He has had some indigestion type pain which based on his desc

## 2021-08-27 ENCOUNTER — Telehealth: Payer: Self-pay | Admitting: *Deleted

## 2021-08-27 LAB — COMPREHENSIVE METABOLIC PANEL
ALT: 52 IU/L — ABNORMAL HIGH (ref 0–44)
AST: 60 IU/L — ABNORMAL HIGH (ref 0–40)
Albumin/Globulin Ratio: 1.5 (ref 1.2–2.2)
Albumin: 4.7 g/dL (ref 3.8–4.8)
Alkaline Phosphatase: 87 IU/L (ref 44–121)
BUN/Creatinine Ratio: 8 — ABNORMAL LOW (ref 10–24)
BUN: 9 mg/dL (ref 8–27)
Bilirubin Total: 0.7 mg/dL (ref 0.0–1.2)
CO2: 21 mmol/L (ref 20–29)
Calcium: 10.2 mg/dL (ref 8.6–10.2)
Chloride: 98 mmol/L (ref 96–106)
Creatinine, Ser: 1.18 mg/dL (ref 0.76–1.27)
Globulin, Total: 3.1 g/dL (ref 1.5–4.5)
Glucose: 92 mg/dL (ref 70–99)
Potassium: 5 mmol/L (ref 3.5–5.2)
Sodium: 140 mmol/L (ref 134–144)
Total Protein: 7.8 g/dL (ref 6.0–8.5)
eGFR: 68 mL/min/{1.73_m2} (ref >59)

## 2021-08-27 LAB — CBC
Hematocrit: 44.2 % (ref 37.5–51.0)
Hemoglobin: 15.4 g/dL (ref 13.0–17.7)
MCH: 32.3 pg (ref 26.6–33.0)
MCHC: 34.8 g/dL (ref 31.5–35.7)
MCV: 93 fL (ref 79–97)
Platelets: 254 10*3/uL (ref 150–450)
RBC: 4.77 x10E6/uL (ref 4.14–5.80)
RDW: 13.1 % (ref 11.6–15.4)
WBC: 5 10*3/uL (ref 3.4–10.8)

## 2021-08-27 LAB — TSH: TSH: 1.6 u[IU]/mL (ref 0.450–4.500)

## 2021-08-27 NOTE — Telephone Encounter (Signed)
Left voicemail message to call back for review of results.  

## 2021-08-27 NOTE — Telephone Encounter (Signed)
-----   Message from Creig Hines, NP sent at 08/27/2021 12:30 PM EDT ----- ?Kidney function and electrolytes are normal.  Liver enzymes are mildly abnormal, which is actually improved compared to October are likely secondary to alcoholism.  Blood counts are normal.  TSH normal. ?

## 2021-09-02 NOTE — Telephone Encounter (Signed)
Left voicemail message to call back for review of results.  

## 2021-09-05 NOTE — Telephone Encounter (Signed)
Left voicemail message to call back for review of results.  

## 2021-09-09 ENCOUNTER — Encounter: Payer: Self-pay | Admitting: *Deleted

## 2021-09-09 NOTE — Telephone Encounter (Signed)
Left voicemail message that I was calling to review results and we have not been able to reach him. Advised I would mail letter with those results.  ?

## 2021-10-14 ENCOUNTER — Telehealth: Payer: Self-pay | Admitting: Cardiovascular Disease

## 2021-10-14 MED ORDER — APIXABAN 5 MG PO TABS: 5.0000 mg | ORAL_TABLET | Freq: Two times a day (BID) | ORAL | 5 refills | Status: DC

## 2021-10-14 NOTE — Telephone Encounter (Signed)
Please review

## 2021-10-14 NOTE — Telephone Encounter (Signed)
?*  STAT* If patient is at the pharmacy, call can be transferred to refill team. ? ? ?1. Which medications need to be refilled? (please list name of each medication and dose if known)  ? apixaban (ELIQUIS) 5 MG TABS tablet  ? ? ?2. Which pharmacy/location (including street and city if local pharmacy) is medication to be sent to?  ?[facility] - [place], [place] - [address] ?3. Do they need a 30 day or 90 day supply?  ?30 day ? ?

## 2021-10-14 NOTE — Telephone Encounter (Signed)
Prescription refill request for Eliquis received. ?Indication: PAF ?Last office visit: 08/26/21  Paralee Cancel FNP ?Scr: 1.18 in 08/26/21 ?Age: 66 ?Weight: 73.9kg ? ?Based on above findings Eliquis 5mg  twice daily is the appropriate dose.  Refill approved. ? ?

## 2021-10-19 ENCOUNTER — Encounter: Payer: Self-pay | Admitting: Emergency Medicine

## 2021-10-19 ENCOUNTER — Emergency Department: Payer: Medicare Other

## 2021-10-19 ENCOUNTER — Emergency Department
Admission: EM | Admit: 2021-10-19 | Discharge: 2021-10-22 | Disposition: A | Discharge status: Home / Self Care | Payer: Medicare Other | Attending: Emergency Medicine | Admitting: Emergency Medicine

## 2021-10-19 DIAGNOSIS — Y908 Blood alcohol level of 240 mg/100 ml or more: Secondary | ICD-10-CM | POA: Insufficient documentation

## 2021-10-19 DIAGNOSIS — F17200 Nicotine dependence, unspecified, uncomplicated: Secondary | ICD-10-CM | POA: Insufficient documentation

## 2021-10-19 DIAGNOSIS — R7401 Elevation of levels of liver transaminase levels: Secondary | ICD-10-CM | POA: Insufficient documentation

## 2021-10-19 DIAGNOSIS — Z79899 Other long term (current) drug therapy: Secondary | ICD-10-CM | POA: Diagnosis not present

## 2021-10-19 DIAGNOSIS — Z7901 Long term (current) use of anticoagulants: Secondary | ICD-10-CM | POA: Insufficient documentation

## 2021-10-19 DIAGNOSIS — R079 Chest pain, unspecified: Secondary | ICD-10-CM | POA: Diagnosis present

## 2021-10-19 DIAGNOSIS — R0789 Other chest pain: Secondary | ICD-10-CM | POA: Insufficient documentation

## 2021-10-19 DIAGNOSIS — I10 Essential (primary) hypertension: Secondary | ICD-10-CM | POA: Diagnosis not present

## 2021-10-19 DIAGNOSIS — F10120 Alcohol abuse with intoxication, uncomplicated: Secondary | ICD-10-CM | POA: Diagnosis not present

## 2021-10-19 DIAGNOSIS — F10920 Alcohol use, unspecified with intoxication, uncomplicated: Secondary | ICD-10-CM

## 2021-10-19 DIAGNOSIS — F10129 Alcohol abuse with intoxication, unspecified: Secondary | ICD-10-CM | POA: Insufficient documentation

## 2021-10-19 LAB — COMPREHENSIVE METABOLIC PANEL
ALT: 47 U/L — ABNORMAL HIGH (ref 0–44)
AST: 55 U/L — ABNORMAL HIGH (ref 15–41)
Albumin: 3.7 g/dL (ref 3.5–5.0)
Alkaline Phosphatase: 61 U/L (ref 38–126)
Anion gap: 9 (ref 5–15)
BUN: 7 mg/dL — ABNORMAL LOW (ref 8–23)
CO2: 22 mmol/L (ref 22–32)
Calcium: 8.4 mg/dL — ABNORMAL LOW (ref 8.9–10.3)
Chloride: 101 mmol/L (ref 98–111)
Creatinine, Ser: 0.91 mg/dL (ref 0.61–1.24)
GFR, Estimated: >60 mL/min (ref >60)
Glucose, Bld: 86 mg/dL (ref 70–99)
Potassium: 3.8 mmol/L (ref 3.5–5.1)
Sodium: 132 mmol/L — ABNORMAL LOW (ref 135–145)
Total Bilirubin: 0.5 mg/dL (ref 0.3–1.2)
Total Protein: 6.7 g/dL (ref 6.5–8.1)

## 2021-10-19 LAB — CBC
HCT: 37.2 % — ABNORMAL LOW (ref 39.0–52.0)
Hemoglobin: 12.7 g/dL — ABNORMAL LOW (ref 13.0–17.0)
MCH: 32.1 pg (ref 26.0–34.0)
MCHC: 34.1 g/dL (ref 30.0–36.0)
MCV: 93.9 fL (ref 80.0–100.0)
Platelets: 167 10*3/uL (ref 150–400)
RBC: 3.96 MIL/uL — ABNORMAL LOW (ref 4.22–5.81)
RDW: 15.1 % (ref 11.5–15.5)
WBC: 4.9 10*3/uL (ref 4.0–10.5)
nRBC: 0 % (ref 0.0–0.2)

## 2021-10-19 LAB — ACETAMINOPHEN LEVEL: Acetaminophen (Tylenol), Serum: <10 ug/mL — ABNORMAL LOW (ref 10–30)

## 2021-10-19 LAB — URINE DRUG SCREEN, QUALITATIVE (ARMC ONLY)
Amphetamines, Ur Screen: NOT DETECTED
Barbiturates, Ur Screen: NOT DETECTED
Benzodiazepine, Ur Scrn: NOT DETECTED
Cannabinoid 50 Ng, Ur ~~LOC~~: NOT DETECTED
Cocaine Metabolite,Ur ~~LOC~~: NOT DETECTED
MDMA (Ecstasy)Ur Screen: NOT DETECTED
Methadone Scn, Ur: NOT DETECTED
Opiate, Ur Screen: NOT DETECTED
Phencyclidine (PCP) Ur S: NOT DETECTED
Tricyclic, Ur Screen: NOT DETECTED

## 2021-10-19 LAB — ETHANOL: Alcohol, Ethyl (B): 273 mg/dL — ABNORMAL HIGH (ref <10)

## 2021-10-19 LAB — TROPONIN I (HIGH SENSITIVITY): Troponin I (High Sensitivity): 7 ng/L (ref <18)

## 2021-10-19 LAB — SALICYLATE LEVEL: Salicylate Lvl: <7 mg/dL — ABNORMAL LOW (ref 7.0–30.0)

## 2021-10-19 MED ORDER — LORAZEPAM 2 MG/ML IJ SOLN
0.0000 mg | Freq: Four times a day (QID) | INTRAMUSCULAR | Status: AC
  Administered 2021-10-19 – 2021-10-20 (×2): 2 mg via INTRAVENOUS
  Administered 2021-10-21: 1 mg via INTRAVENOUS
  Filled 2021-10-19 (×3): qty 1

## 2021-10-19 MED ORDER — THIAMINE HCL 100 MG PO TABS
100.0000 mg | ORAL_TABLET | Freq: Every day | ORAL | Status: DC
  Administered 2021-10-20 – 2021-10-22 (×3): 100 mg via ORAL
  Filled 2021-10-19 (×4): qty 1

## 2021-10-19 MED ORDER — FOLIC ACID 1 MG PO TABS
1.0000 mg | ORAL_TABLET | Freq: Once | ORAL | Status: AC
  Administered 2021-10-19: 1 mg via ORAL
  Filled 2021-10-19: qty 1

## 2021-10-19 NOTE — ED Triage Notes (Signed)
Pt arrived via [facility] from home where he called out due to chest pain and feeling as if his A-Fib was "acting up." Pt also wanting assistance with alcohol. Pt sts he drinks approx 4 40oz beers per day as well as 1/2 pack cigarettes per day. Pt sts he is complaint with all prescribed daily medication. Pt denies SI and HI.

## 2021-10-19 NOTE — ED Triage Notes (Signed)
FIRST NURSE NOTE:  Pt arrived via [facility] from home, lives with sister, here for detox from alcohol, has had 3 40oz drinks today.  Pt has hx of afib, VSS with EMS.  CBG 114 C/o chest discomfort given 500cc fluid bolus  18G LA  Pt had incontinent episode with EMS.

## 2021-10-19 NOTE — ED Provider Notes (Signed)
[facility] Provider Note    Event Date/Time   First MD Initiated Contact with Patient 10/19/21 2302     (approximate)   History   Chest Pain and Alcohol Problem   HPI  Darren Rogers is a 66 y.o. male brought to the ED via EMS from home with a chief complaint of chest pain and desiring detox.  Patient with a history significant for atrial fibrillation and alcohol dependency.  States he has been fighting a lot with his sister lately and drinking more alcohol than usual.  Reports compliance with all prescribed medications.  Denies SI/HI/AH/VH.  Denies associated fever, cough, shortness of breath, abdominal pain, nausea, vomiting or dizziness.     Past Medical History   Past Medical History:  Diagnosis Date   Claudication (HCC)    a. 06/2015 ABI: R - 0.73, L - 0.73. 30-49% bilat SFA stenosis. 50-74% R Profunda stenosis; b. 01/2017 ABI: R 0.89, L 0.88, TBI R 0.65, L 1.0.   Erectile dysfunction    Essential hypertension    History of echocardiogram    a. 03/29/2014: EF 55-60%, mild LVH, normal RVSP;  b. 05/2015 Echo: EF 60-65%, triv AI, mild MR; c. 03/2021 Echo: EF 55-60%, no rwma, mild LVH, nl RV fxn, mildly dil LA, mild MR.   Hyperlipidemia    Non-obstructive CAD    a. 01/27/2014 Cath: LM nl, LAD mild diff dz w/o obs, LCX no sig obs - scattered 20-30% mLCx, RCA no obs dz, EF 70%; b. 06/2015 MV; EF 60%, no ischemia; c. 07/2020 MV: No ischemia/scar.   Persistent atrial fibrillation (HCC)    a. Pt says Dx >46yrs ago w/ ? RFCA @ [facility];  b. Recurrent 12/2013;  b. Rx Flecainide and Xarelto-->Intermittent compliance; c. 03/2021 Zio: 100% Afib w/ avg rate of 99 bpm. Rare PVCs.   PVC's (premature ventricular contractions)    a. 05/2017 Zio: 4000 PVCs in 48 hrs (2%). Brief run of SVT.   Tobacco abuse    a. ongoing - 1 ppd.     Active Problem List   Patient Active Problem List   Diagnosis Date Noted   AKI (acute kidney injury) (HCC) 03/10/2021   Lactic acidosis  03/10/2021   Acute encephalopathy 03/10/2021   Symptomatic bradycardia 03/09/2021   Abdominal pain, RUQ    Alcoholic pancreatitis 12/07/2020   Non compliance w medication regimen 12/07/2020   Atrial fibrillation (HCC) 11/20/2020   Abscess of buccal space of mouth 11/19/2020   Dental abscess 11/19/2020   Facial cellulitis_left 11/19/2020   Cellulitis of face    Atrial fibrillation with RVR (HCC) 08/06/2020   Demand ischemia (HCC)    Polysubstance abuse (HCC)    Rapid atrial fibrillation (HCC) 08/05/2020   Alcohol use disorder, moderate, dependence (HCC) 08/05/2020   Cocaine abuse (HCC) 08/05/2020   Alcohol abuse 10/20/2019   Depression 10/20/2019   A-fib (HCC) 01/15/2017   Personal history of tobacco use, presenting hazards to health 08/17/2016   BPH associated with nocturia 07/30/2016   IFG (impaired fasting glucose) 07/30/2016   Other chest pain 08/22/2015   Essential hypertension    PAD (peripheral artery disease) (HCC)    Drug-induced erectile dysfunction 01/22/2015   CAD (coronary artery disease) 04/11/2014   CAD in native artery 04/11/2014   Paroxysmal atrial fibrillation (HCC)    Tobacco abuse    Hyperlipidemia    Atrial fibrillation with rapid ventricular response (HCC) 01/27/2014     Past Surgical History   Past Surgical  History:  Procedure Laterality Date   CARDIAC CATHETERIZATION     CARDIAC CATHETERIZATION     [facility]   LEFT HEART CATH Right 01/27/2014   Procedure: LEFT HEART CATH;  Surgeon: Micheline Chapman, MD;  Location: [facility] CATH LAB;  Service: Cardiovascular;  Laterality: Right;     Home Medications   Prior to Admission medications   Medication Sig Start Date End Date Taking? Authorizing Provider  apixaban (ELIQUIS) 5 MG TABS tablet Take 1 tablet (5 mg total) by mouth 2 (two) times daily. 10/14/21 01/12/22 Yes Iran Ouch, MD  diltiazem (CARDIZEM CD) 180 MG 24 hr capsule Take 1 capsule (180 mg total) by mouth daily. 08/26/21  Yes Creig Hines, NP  metoprolol tartrate (LOPRESSOR) 100 MG tablet Take 1 tablet (100 mg total) by mouth 2 (two) times daily. 08/26/21  Yes Creig Hines, NP  pantoprazole (PROTONIX) 40 MG tablet Take 1 tablet (40 mg total) by mouth daily. 08/26/21  Yes Creig Hines, NP     Allergies  Patient has no known allergies.   Family History   Family History  Problem Relation Age of Onset   Coronary artery disease Father 92   Diabetes Mother    Diabetes Sister    Diabetes Brother    Diabetes Maternal Grandmother    Diabetes Sister    Diabetes Sister      Physical Exam  Triage Vital Signs: ED Triage Vitals  Enc Vitals Group     BP 10/19/21 2128 (!) 125/111     Pulse Rate 10/19/21 2128 (!) 102     Resp 10/19/21 2128 (!) 22     Temp 10/19/21 2128 97.9 F (36.6 C)     Temp Source 10/19/21 2128 Oral     SpO2 10/19/21 2128 98 %     Weight 10/19/21 2129 156 lb (70.8 kg)     Height 10/19/21 2129 5\' 6"  (1.676 m)     Head Circumference --      Peak Flow --      Pain Score --      Pain Loc --      Pain Edu? --      Excl. in GC? --     Updated Vital Signs: BP (!) 132/92   Pulse 72   Temp 97.9 F (36.6 C) (Oral)   Resp 16   Ht 5\' 6"  (1.676 m)   Wt 70.8 kg   SpO2 98%   BMI 25.18 kg/m    General: Awake, no distress.  CV:  Irregular rhythm, regular rate.  Good peripheral perfusion.  Resp:  Normal effort.  CTA B. Abd:  Nontender.  No distention.  Other:  Nontender and nonswollen calves.   ED Results / Procedures / Treatments  Labs (all labs ordered are listed, but only abnormal results are displayed) Labs Reviewed  COMPREHENSIVE METABOLIC PANEL - Abnormal; Notable for the following components:      Result Value   Sodium 132 (*)    BUN 7 (*)    Calcium 8.4 (*)    AST 55 (*)    ALT 47 (*)    All other components within normal limits  ETHANOL - Abnormal; Notable for the following components:   Alcohol, Ethyl (B) 273 (*)    All other components within  normal limits  SALICYLATE LEVEL - Abnormal; Notable for the following components:   Salicylate Lvl <7.0 (*)    All other components within normal limits  ACETAMINOPHEN LEVEL -  Abnormal; Notable for the following components:   Acetaminophen (Tylenol), Serum <10 (*)    All other components within normal limits  CBC - Abnormal; Notable for the following components:   RBC 3.96 (*)    Hemoglobin 12.7 (*)    HCT 37.2 (*)    All other components within normal limits  URINE DRUG SCREEN, QUALITATIVE ([facility] ONLY)  TROPONIN I (HIGH SENSITIVITY)  TROPONIN I (HIGH SENSITIVITY)     EKG  ED ECG REPORT I, Damiyah Ditmars J, the attending physician, personally viewed and interpreted this ECG.   Date: 10/19/2021  EKG Time: 2131  Rate: 87  Rhythm: atrial fibrillation, rate 87  Axis: Normal  Intervals:none  ST&T Change: Nonspecific    RADIOLOGY I have independently visualized and interpreted patient's chest x-ray as well as noted the radiology interpretation:  Chest x-ray: No acute cardiopulmonary process  Official radiology report(s): DG Chest Port 1 View  Result Date: 10/19/2021 CLINICAL DATA:  Chest pain EXAM: PORTABLE CHEST 1 VIEW COMPARISON:  03/09/2021 FINDINGS: The heart size and mediastinal contours are within normal limits. Both lungs are clear. The visualized skeletal structures are unremarkable. IMPRESSION: No active disease. Electronically Signed   By: Charlett Nose M.D.   On: 10/19/2021 23:19     PROCEDURES:  Critical Care performed: No  .1-3 Lead EKG Interpretation Performed by: Irean Hong, MD Authorized by: Irean Hong, MD     Interpretation: abnormal     ECG rate:  85   ECG rate assessment: normal     Rhythm: atrial fibrillation     Ectopy: none     Conduction: normal   Comments:     Patient placed on cardiac monitor to evaluate for arrhythmias   MEDICATIONS ORDERED IN ED: Medications  thiamine tablet 100 mg (has no administration in time range)  LORazepam  (ATIVAN) injection 0-4 mg (0 mg Intravenous Not Given 10/20/21 0511)  nicotine (NICODERM CQ - dosed in mg/24 hours) patch 21 mg (21 mg Transdermal Patch Applied 10/20/21 0029)  folic acid (FOLVITE) tablet 1 mg (1 mg Oral Given 10/19/21 2333)     IMPRESSION / MDM / ASSESSMENT AND PLAN / ED COURSE  I reviewed the triage vital signs and the nursing notes.                             66 year old male with atrial fibrillation presenting with chest pain and desire for alcohol detox.  I have identified that this patient may have a potentially life-threatening condition. Differential diagnosis includes, but is not limited to, ACS, aortic dissection, pulmonary embolism, cardiac tamponade, pneumothorax, pneumonia, pericarditis, myocarditis, GI-related causes including esophagitis/gastritis, and musculoskeletal chest wall pain.     I have personally reviewed patient's records and see that he had a last cardiology office visit on 08/26/2021 for paroxysmal atrial fibrillation.  The patient is on the cardiac monitor to evaluate for evidence of arrhythmia and/or significant heart rate changes.  Laboratory results demonstrate normal WBC 4.9, minimally elevated transaminases, elevated EtOH, initial troponin negative.  Chest x-ray unremarkable.  UDS unremarkable.  Will check repeat troponin, place on CIWA, consult TTS for EtOH detox.    Clinical Course as of 10/20/21 0634  Mon Oct 20, 2021  0008 Repeat troponin remains negative.  At this time patient is on CIWA awaiting TTS consult for detox. [JS]  0023 Patient caught smoking a cigarette in his room.  Nicotine patch ordered. [JS]  [id number] Patient remains  in the ED awaiting TTS consult for detox. [JS]    Clinical Course User Index [JS] Irean Hong, MD    FINAL CLINICAL IMPRESSION(S) / ED DIAGNOSES   Final diagnoses:  Nonspecific chest pain  Alcoholic intoxication without complication (HCC)     Rx / DC Orders   ED Discharge Orders     None         Note:  This document was prepared using Dragon voice recognition software and may include unintentional dictation errors.   Irean Hong, MD 10/20/21 [phone]

## 2021-10-20 DIAGNOSIS — R0789 Other chest pain: Secondary | ICD-10-CM | POA: Diagnosis not present

## 2021-10-20 LAB — TROPONIN I (HIGH SENSITIVITY): Troponin I (High Sensitivity): 8 ng/L (ref <18)

## 2021-10-20 MED ORDER — METOPROLOL TARTRATE 50 MG PO TABS
100.0000 mg | ORAL_TABLET | Freq: Two times a day (BID) | ORAL | Status: DC
  Administered 2021-10-20 – 2021-10-22 (×5): 100 mg via ORAL
  Filled 2021-10-20 (×6): qty 2

## 2021-10-20 MED ORDER — PANTOPRAZOLE SODIUM 40 MG PO TBEC
40.0000 mg | DELAYED_RELEASE_TABLET | Freq: Every day | ORAL | Status: DC
  Administered 2021-10-20 – 2021-10-22 (×3): 40 mg via ORAL
  Filled 2021-10-20 (×4): qty 1

## 2021-10-20 MED ORDER — APIXABAN 5 MG PO TABS
5.0000 mg | ORAL_TABLET | Freq: Two times a day (BID) | ORAL | Status: DC
  Administered 2021-10-20 – 2021-10-22 (×5): 5 mg via ORAL
  Filled 2021-10-20 (×6): qty 1

## 2021-10-20 MED ORDER — NICOTINE 21 MG/24HR TD PT24
21.0000 mg | MEDICATED_PATCH | Freq: Once | TRANSDERMAL | Status: AC
  Administered 2021-10-20: 21 mg via TRANSDERMAL
  Filled 2021-10-20: qty 1

## 2021-10-20 MED ORDER — MELATONIN 5 MG PO TABS
5.0000 mg | ORAL_TABLET | Freq: Every day | ORAL | Status: DC
  Administered 2021-10-20 – 2021-10-21 (×2): 5 mg via ORAL
  Filled 2021-10-20 (×2): qty 1

## 2021-10-20 MED ORDER — DILTIAZEM HCL ER COATED BEADS 180 MG PO CP24
180.0000 mg | ORAL_CAPSULE | Freq: Every day | ORAL | Status: DC
  Administered 2021-10-20 – 2021-10-22 (×3): 180 mg via ORAL
  Filled 2021-10-20 (×3): qty 1

## 2021-10-20 NOTE — BH Assessment (Signed)
Referral information for detox/susbstance abuse treatment faxed to;   [facility] [phone]),   [facility] [phone])  [facility] [phone])  [facility] [phone])    [facility] [phone])

## 2021-10-20 NOTE — ED Notes (Signed)
Upon entering the pts room, the pts room smelled like nicotine cigarette smoke and the pt was asked if he smoked while in the room, pt denied smoking in the room. This write looked around the room and found the cigarette that was smoked in the patients water cup once it was put out. Pt moved into one hall to be monitored closer to the nurses station

## 2021-10-20 NOTE — ED Notes (Signed)
Dietary called for breakfast tray.

## 2021-10-20 NOTE — ED Notes (Signed)
Pt sleeping at this time   Rise and fall of chest noted

## 2021-10-20 NOTE — BH Assessment (Signed)
Comprehensive Clinical Assessment (CCA) Note  10/20/2021 Darren Rogers [id number] Recommendations for Services/Supports/Treatments: Disposition: Pt. is recommended for substance abuse treatment/detox. Substance abuse facilities will be contacted for placement.  Notified EDP Dr. Sidney Ace and RN of disposition recommendation.  Darren Rogers is a 66 year old patient who presented to [facility] ED voluntarily requesting detox. Per triage note, Pt arrived via [facility] from home, lives with sister, here for detox from alcohol, has had 3 40oz drinks today. Upon interview, pt. was cooperative and forthcoming when probed about substance use. Pt states, "I relapsed after a bad relationship." Pt had sullen mood and a congruent affect. Pt admitted to daily alcohol use, explaining that he drinks 4 40z beers, daily. Pt identified anxiety, tremors, and racing thoughts as withdrawal symptoms. When, probed, pt. identified his strained relationship with his sister as his main stressor. Pt reported that he is motivated for treatment as he has one month left on probation and is trying to pursue getting his driving privileges back. Pt was alert and oriented x4. Pt had good judgment and insight. Pt expressed a strong desire to change and seek treatment due to concerns about the ways in which his drinking may impact his physical health, and is in the action stage.  Pt had clear and coherent speech. Pt's thought processes were relevant and intact. Pt's was BAL was 273; UDS unremarkable. Pt denied current SI/HI/AV/H. There is no acute risk for suicide or violence at this time. Chief Complaint:  Chief Complaint  Patient presents with   Chest Pain   Alcohol Problem   Visit Diagnosis: Alcohol use disorder, severe    CCA Screening, Triage and Referral (STR)  Patient Reported Information How did you hear about [place]? Self  Referral name: No data recorded Referral phone number: No data recorded  Whom do you see for routine medical  problems? No data recorded Practice/Facility Name: No data recorded Practice/Facility Phone Number: No data recorded Name of Contact: No data recorded Contact Number: No data recorded Contact Fax Number: No data recorded Prescriber Name: No data recorded Prescriber Address (if known): No data recorded  What Is the Reason for Your Visit/Call Today? Pt arrived via [facility] from home, lives with sister, here for detox from alcohol, has had 3 40oz drinks today.  How Long Has This Been Causing You Problems? > than 6 months  What Do You Feel Would Help You the Most Today? Alcohol or Drug Use Treatment   Have You Recently Been in Any Inpatient Treatment (Hospital/Detox/Crisis Center/28-Day Program)? No data recorded Name/Location of Program/Hospital:No data recorded How Long Were You There? No data recorded When Were You Discharged? No data recorded  Have You Ever Received Services From [facility] Before? No data recorded Who Do You See at [facility]? No data recorded  Have You Recently Had Any Thoughts About Hurting Yourself? No  Are You Planning to Commit Suicide/Harm Yourself At This time? No   Have you Recently Had Thoughts About Hurting Someone Darren Rogers? No  Explanation: No data recorded  Have You Used Any Alcohol or Drugs in the Past 24 Hours? Yes  How Long Ago Did You Use Drugs or Alcohol? No data recorded What Did You Use and How Much? Alcohol; 4 40 oz beers   Do You Currently Have a Therapist/Psychiatrist? No  Name of Therapist/Psychiatrist: No data recorded  Have You Been Recently Discharged From Any Office Practice or Programs? No  Explanation of Discharge From Practice/Program: No data recorded    CCA Screening Triage Referral  Assessment Type of Contact: Face-to-Face  Is this Initial or Reassessment? No data recorded Date Telepsych consult ordered in [facility]:  No data recorded Time Telepsych consult ordered in [facility]:  No data recorded  Patient Reported Information  Reviewed? No data recorded Patient Left Without Being Seen? No data recorded Reason for Not Completing Assessment: No data recorded  Collateral Involvement: None provided   Does Patient Have a Court Appointed Legal Guardian? No data recorded Name and Contact of Legal Guardian: No data recorded If Minor and Not Living with Parent(s), Who has Custody? n/a  Is CPS involved or ever been involved? Never  Is APS involved or ever been involved? Never   Patient Determined To Be At Risk for Harm To Self or Others Based on Review of Patient Reported Information or Presenting Complaint? No  Method: No data recorded Availability of Means: No data recorded Intent: No data recorded Notification Required: No data recorded Additional Information for Danger to Others Potential: No data recorded Additional Comments for Danger to Others Potential: No data recorded Are There Guns or Other Weapons in Your Home? No data recorded Types of Guns/Weapons: No data recorded Are These Weapons Safely Secured?                            No data recorded Who Could Verify You Are Able To Have These Secured: No data recorded Do You Have any Outstanding Charges, Pending Court Dates, Parole/Probation? No data recorded Contacted To Inform of Risk of Harm To Self or Others: No data recorded  Location of Assessment: [facility] ED   Does Patient Present under Involuntary Commitment? No  IVC Papers Initial File Date: No data recorded  [place] of Residence: [place]   Patient Currently Receiving the Following Services: Not Receiving Services   Determination of Need: Urgent (48 hours)   Options For Referral: ED Referral; Therapeutic Triage Services     CCA Biopsychosocial Intake/Chief Complaint:  No data recorded Current Symptoms/Problems: No data recorded  Patient Reported Schizophrenia/Schizoaffective Diagnosis in Past: No   Strengths: Pt is motivated for treatment  Preferences: No data  recorded Abilities: No data recorded  Type of Services Patient Feels are Needed: No data recorded  Initial Clinical Notes/Concerns: No data recorded  Mental Health Symptoms Depression:   None   Duration of Depressive symptoms: No data recorded  Mania:   None   Anxiety:    N/A   Psychosis:   None   Duration of Psychotic symptoms: No data recorded  Trauma:   N/A   Obsessions:   None   Compulsions:   Repeated behaviors/mental acts; "Driven" to perform behaviors/acts; Good insight; Intended to reduce stress or prevent another outcome; Disrupts with routine/functioning   Inattention:   None   Hyperactivity/Impulsivity:   N/A   Oppositional/Defiant Behaviors:   None   Emotional Irregularity:   None   Other Mood/Personality Symptoms:  No data recorded   Mental Status Exam Appearance and self-care  Stature:   Average   Weight:   Average weight   Clothing:   -- (In scrubs)   Grooming:   Normal   Cosmetic use:   None   Posture/gait:   Normal   Motor activity:   Not Remarkable   Sensorium  Attention:   Normal   Concentration:   Normal   Orientation:   Situation; Place; Object; Person   Recall/memory:   Normal   Affect and Mood  Affect:  Appropriate   Mood:   Dysphoric   Relating  Eye contact:   Normal   Facial expression:   Responsive   Attitude toward examiner:   Cooperative   Thought and Language  Speech flow:  Normal   Thought content:   Appropriate to Mood and Circumstances   Preoccupation:   None   Hallucinations:   None   Organization:  No data recorded  [employer] of Knowledge:   Average   Intelligence:   Average   Abstraction:   Normal   Judgement:   Good   Reality Testing:   Adequate   Insight:   Good   Decision Making:   Normal   Social Functioning  Social Maturity:   Responsible   Social Judgement:   Normal   Stress  Stressors:   Family conflict   Coping  Ability:   Exhausted; Deficient supports   Skill Deficits:   None   Supports:   Family; Support needed     Religion: Religion/Spirituality Are You A Religious Person?: No  Leisure/Recreation: Leisure / Recreation Do You Have Hobbies?: No  Exercise/Diet: Exercise/Diet Do You Exercise?: No Have You Gained or Lost A Significant Amount of Weight in the Past Six Months?: No Do You Follow a Special Diet?: No Do You Have Any Trouble Sleeping?: No   CCA Employment/Education Employment/Work Situation: Employment / 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 has Been Impacted by Current Illness: No Has Patient ever Been in Equities trader?: No  Education: Education Is Patient Currently Attending School?: No Did Theme park manager?: No Did You Have An Individualized Education Program (IIEP): No Did You Have Any Difficulty At [employer]?: No Patient's Education Has Been Impacted by Current Illness: No   CCA Family/Childhood History Family and Relationship History: Family history Marital status: Single Does patient have children?: No  Childhood History:  Childhood History By whom was/is the patient raised?: Other (Comment) (Not assessed) Did patient suffer any verbal/emotional/physical/sexual abuse as a child?: No Did patient suffer from severe childhood neglect?: No Has patient ever been sexually abused/assaulted/raped as an adolescent or adult?: No Was the patient ever a victim of a crime or a disaster?: No Witnessed domestic violence?: No Has patient been affected by domestic violence as an adult?: No  Child/Adolescent Assessment:     CCA Substance Use Alcohol/Drug Use: Alcohol / Drug Use Pain Medications: See MAR Prescriptions: See MAR Over the Counter: See MAR History of alcohol / drug use?: Yes Longest period of sobriety (when/how long): Seven years Negative Consequences of Use: Personal relationships, Legal Withdrawal Symptoms: Tremors, Other  (Comment) (Racing thoughts and anxiety)                         ASAM's:  Six Dimensions of Multidimensional Assessment  Dimension 1:  Acute Intoxication and/or Withdrawal Potential:   Dimension 1:  Description of individual's past and current experiences of substance use and withdrawal: Pt has a hx of alcohol use disoder  Dimension 2:  Biomedical Conditions and Complications:   Dimension 2:  Description of patient's biomedical conditions and  complications: Pt has Afib  Dimension 3:  Emotional, Behavioral, or Cognitive Conditions and Complications:  Dimension 3:  Description of emotional, behavioral, or cognitive conditions and complications: Pt has a hx of depression  Dimension 4:  Readiness to Change:  Dimension 4:  Description of Readiness to Change criteria: Pt is in the action stage of change  Dimension 5:  Relapse, Continued use, or Continued Problem Potential:     Dimension 6:  Recovery/Living Environment:     ASAM Severity Score: ASAM's Severity Rating Score: 14  ASAM Recommended Level of Treatment: ASAM Recommended Level of Treatment: Level III Residential Treatment   Substance use Disorder (SUD) Substance Use Disorder (SUD)  Checklist Symptoms of Substance Use: Continued use despite having a persistent/recurrent physical/psychological problem caused/exacerbated by use, Continued use despite persistent or recurrent social, interpersonal problems, caused or exacerbated by use, Evidence of tolerance, Evidence of withdrawal (Comment), Large amounts of time spent to obtain, use or recover from the substance(s), Persistent desire or unsuccessful efforts to cut down or control use, Presence of craving or strong urge to use, Recurrent use that results in a failure to fulfill major role obligations (work, school, home), Social, occupational, recreational activities given up or reduced due to use  Recommendations for Services/Supports/Treatments: Recommendations for  Services/Supports/Treatments Recommendations For Services/Supports/Treatments: Detox  DSM5 Diagnoses: Patient Active Problem List   Diagnosis Date Noted   AKI (acute kidney injury) (HCC) 03/10/2021   Lactic acidosis 03/10/2021   Acute encephalopathy 03/10/2021   Symptomatic bradycardia 03/09/2021   Abdominal pain, RUQ    Alcoholic pancreatitis 12/07/2020   Non compliance w medication regimen 12/07/2020   Atrial fibrillation (HCC) 11/20/2020   Abscess of buccal space of mouth 11/19/2020   Dental abscess 11/19/2020   Facial cellulitis_left 11/19/2020   Cellulitis of face    Atrial fibrillation with RVR (HCC) 08/06/2020   Demand ischemia (HCC)    Polysubstance abuse (HCC)    Rapid atrial fibrillation (HCC) 08/05/2020   Alcohol use disorder, moderate, dependence (HCC) 08/05/2020   Cocaine abuse (HCC) 08/05/2020   Alcohol abuse 10/20/2019   Depression 10/20/2019   A-fib (HCC) 01/15/2017   Personal history of tobacco use, presenting hazards to health 08/17/2016   BPH associated with nocturia 07/30/2016   IFG (impaired fasting glucose) 07/30/2016   Other chest pain 08/22/2015   Essential hypertension    PAD (peripheral artery disease) (HCC)    Drug-induced erectile dysfunction 01/22/2015   CAD (coronary artery disease) 04/11/2014   CAD in native artery 04/11/2014   Paroxysmal atrial fibrillation (HCC)    Tobacco abuse    Hyperlipidemia    Atrial fibrillation with rapid ventricular response (HCC) 01/27/2014    Darina Hartwell [place], LCAS

## 2021-10-20 NOTE — ED Provider Notes (Signed)
Emergency Medicine Observation Re-evaluation Note  Darren Rogers is a 66 y.o. male, seen on rounds today.  Pt initially presented to the ED for complaints of Chest Pain and Alcohol Problem Currently, the patient is resting comfortably.  Physical Exam  BP (!) 152/121   Pulse 73   Temp 98.1 F (36.7 C) (Oral)   Resp 16   Ht 5\' 6"  (1.676 m)   Wt 70.8 kg   SpO2 96%   BMI 25.18 kg/m  Physical Exam General: no distress Lungs: no increased WOB Psych: calm  ED Course / MDM  EKG:   I have reviewed the labs performed to date as well as medications administered while in observation.  Recent changes in the last 24 hours include none.  Plan  Current plan is for awaiting TTS disposition for detox. Pt remains on CIWA.  Darren Rogers is not under involuntary commitment.     Beverly Gust, MD 10/20/21 [phone]

## 2021-10-21 DIAGNOSIS — R0789 Other chest pain: Secondary | ICD-10-CM | POA: Diagnosis not present

## 2021-10-21 NOTE — ED Notes (Signed)
Pt ambulatory to the toilet with no assistance. Pt given hospital socks prior to ambulation

## 2021-10-21 NOTE — ED Notes (Signed)
Pt stating that they intend to go home tomorrow and "get some things sorted out"

## 2021-10-21 NOTE — ED Notes (Signed)
RN to bedside to introduce self to pt. Pt sleeping.  

## 2021-10-21 NOTE — ED Notes (Signed)
Pt now stating that they have to go home and sort out some things prior to going to detox. Pt stating they still need to figure out what they plan to do.

## 2021-10-21 NOTE — ED Notes (Signed)
Pt resting in bed at this time. No needs verbalized

## 2021-10-21 NOTE — ED Notes (Signed)
TTS Intake called and regarding patient questions about detox and when they might get placed. Pt stating at this time that they would still like to go forward with detox as of today. PT stating they might change their mind tomorrow but is still happy to go tonight if a bed is available.

## 2021-10-21 NOTE — ED Notes (Signed)
Pt assisted with sitting up and given lunch tray. Pt asking if they can be moved to a room, and patient asking to go home tomorrow.

## 2021-10-21 NOTE — ED Notes (Signed)
VOL  PENDING  PLACEMENT 

## 2021-10-22 DIAGNOSIS — R0789 Other chest pain: Secondary | ICD-10-CM | POA: Diagnosis not present

## 2021-10-22 NOTE — ED Notes (Signed)
Darren Rogers called and cab voucher given to first nurse.  D/C and reasons to return dicussed with pt, pt verbalized understanding. Pt ambulatory with steady gait on D/C. NAD noted.

## 2021-10-22 NOTE — ED Provider Notes (Signed)
-----------------------------------------   1:28 PM on 10/22/2021 ----------------------------------------- Patient now states that he is interested in being discharged home, no longer requesting detox placement.  No signs of acute alcohol withdrawal at this time and he is appropriate for discharge home with outpatient follow-up.   Chesley Noon, MD 10/22/21 1328

## 2021-10-22 NOTE — ED Notes (Signed)
Pt resting calm and comfortably at this time. Normal rise and fall of chest. NAD noted. Call bell in reach.  

## 2021-10-22 NOTE — ED Provider Notes (Signed)
Emergency Medicine Observation Re-evaluation Note  Darren Rogers is a 66 y.o. male, seen on rounds today.  Pt initially presented to the ED for complaints of Chest Pain and Alcohol Problem Currently, the patient is resting.  Physical Exam  BP 120/86 (BP Location: Left Arm)   Pulse 62   Temp 98 F (36.7 C) (Oral)   Resp 16   Ht 5\' 6"  (1.676 m)   Wt 70.8 kg   SpO2 100%   BMI 25.18 kg/m  Physical Exam Gen: No acute distress  Resp: Normal rise and fall of chest Neuro: Moving all four extremities Psych: Resting currently, calm and cooperative when awake    ED Course / MDM  EKG:EKG Interpretation  Date/Time:  Sunday Oct 19 2021 21:31:23 EDT Ventricular Rate:  87 PR Interval:    QRS Duration: 86 QT Interval:  370 QTC Calculation: 445 R Axis:   25 Text Interpretation: Atrial fibrillation Cannot rule out Anterior infarct , age undetermined ST & T wave abnormality, consider inferolateral ischemia Abnormal ECG When compared with ECG of 09-Mar-2021 05:38, PREVIOUS ECG IS PRESENT Confirmed by UNCONFIRMED, DOCTOR (11-Mar-2021), editor [id number], Tammy [phone]) on 10/20/2021 4:23:21 PM  I have reviewed the labs performed to date as well as medications administered while in observation.  Recent changes in the last 24 hours include no acute events overnight.  Plan  Current plan is for placement for detox.  Darren Rogers is not under involuntary commitment.     Darren Rogers, Beverly Gust, DO 10/22/21 [phone]

## 2021-10-22 NOTE — ED Notes (Signed)
VOL/Pending Placement 

## 2021-10-22 NOTE — ED Notes (Signed)
Breakfast tray given to pt 

## 2021-10-30 ENCOUNTER — Ambulatory Visit: Payer: Medicare Other | Admitting: Nurse Practitioner

## 2021-10-30 ENCOUNTER — Encounter: Payer: Self-pay | Admitting: Nurse Practitioner

## 2021-10-30 NOTE — Progress Notes (Signed)
Unable to contact patient to schedule LEA and ABI, letter sent, order cancelled

## 2021-11-04 ENCOUNTER — Ambulatory Visit: Payer: Medicare Other | Admitting: Internal Medicine

## 2021-11-13 ENCOUNTER — Ambulatory Visit: Payer: Medicare Other | Admitting: Nurse Practitioner

## 2021-11-24 ENCOUNTER — Telehealth: Payer: Self-pay | Admitting: Nurse Practitioner

## 2021-11-26 ENCOUNTER — Ambulatory Visit: Payer: Medicare Other | Admitting: Nurse Practitioner

## 2021-12-04 ENCOUNTER — Encounter: Payer: Self-pay | Admitting: Nurse Practitioner

## 2021-12-04 NOTE — Progress Notes (Signed)
Unable to contact patient to schedule Lower Extremity Arterial /ABI, letter sent, order cancelled

## 2021-12-26 NOTE — Telephone Encounter (Signed)
No ans no vm   °

## 2021-12-30 NOTE — Telephone Encounter (Signed)
Attempted to schedule.  LMOV to call office.  ° °

## 2022-01-15 ENCOUNTER — Other Ambulatory Visit: Payer: Self-pay | Admitting: Nurse Practitioner

## 2022-01-16 NOTE — Telephone Encounter (Signed)
Good Morning Pam,  The pharmacy is requesting a refill of Atorvastatin. This patient was last seen by Thayer Ohm on 08-26-21. The note for that visit didn't mention Atorvastatin.  Thayer Ohm wanted the patient to follow up 3 months after. The patient was supposed to have an ABI, but no showed and later his appointment with Thayer Ohm was cancelled on 11/26/21.  Please advise.  Thank you  Bridgett Hattabaugh.

## 2022-02-17 ENCOUNTER — Encounter: Payer: Self-pay | Admitting: Cardiovascular Disease

## 2022-02-17 NOTE — Telephone Encounter (Signed)
Error

## 2022-03-13 ENCOUNTER — Ambulatory Visit: Payer: Medicare Other | Admitting: Nurse Practitioner

## 2022-04-10 ENCOUNTER — Encounter: Payer: Self-pay | Admitting: Nurse Practitioner

## 2022-04-10 ENCOUNTER — Ambulatory Visit: Payer: Medicare Other | Attending: Nurse Practitioner | Admitting: Nurse Practitioner

## 2022-04-10 NOTE — Progress Notes (Deleted)
Office Visit    Patient Name: Darren Rogers Date of Encounter: 04/10/2022  Primary Care Provider:  Patient, No Pcp Per Primary Cardiologist:  Lorine Bears, MD  Chief Complaint    66 year old male with a history of persistent atrial fibrillation, nonobstructive CAD, hypertension, hyperlipidemia, noncompliance, and polysubstance abuse, who presents for follow-up related to atrial fibrillation.  Past Medical History    Past Medical History:  Diagnosis Date   Claudication (HCC)    a. 06/2015 ABI: R - 0.73, L - 0.73. 30-49% bilat SFA stenosis. 50-74% R Profunda stenosis; b. 01/2017 ABI: R 0.89, L 0.88, TBI R 0.65, L 1.0.   Erectile dysfunction    Essential hypertension    History of echocardiogram    a. 03/29/2014: EF 55-60%, mild LVH, normal RVSP;  b. 05/2015 Echo: EF 60-65%, triv AI, mild MR; c. 03/2021 Echo: EF 55-60%, no rwma, mild LVH, nl RV fxn, mildly dil LA, mild MR.   Hyperlipidemia    Non-obstructive CAD    a. 01/27/2014 Cath: LM nl, LAD mild diff dz w/o obs, LCX no sig obs - scattered 20-30% mLCx, RCA no obs dz, EF 70%; b. 06/2015 MV; EF 60%, no ischemia; c. 07/2020 MV: No ischemia/scar.   Persistent atrial fibrillation (HCC)    a. Pt says Dx >60yrs ago w/ ? RFCA @ [facility];  b. Recurrent 12/2013;  b. Rx Flecainide and Xarelto-->Intermittent compliance; c. 03/2021 Zio: 100% Afib w/ avg rate of 99 bpm. Rare PVCs.   PVC's (premature ventricular contractions)    a. 05/2017 Zio: 4000 PVCs in 48 hrs (2%). Brief run of SVT.   Tobacco abuse    a. ongoing - 1 ppd.   Past Surgical History:  Procedure Laterality Date   CARDIAC CATHETERIZATION     CARDIAC CATHETERIZATION     [facility]   LEFT HEART CATH Right 01/27/2014   Procedure: LEFT HEART CATH;  Surgeon: Micheline Chapman, MD;  Location: [facility] CATH LAB;  Service: Cardiovascular;  Laterality: Right;    Allergies  No Known Allergies  History of Present Illness    66 year old male with the above complex past medical history including  persistent atrial fibrillation, nonobstructive CAD, hypertension, hyperlipidemia, noncompliance, and polysubstance use.  He was previously diagnosed with atrial fibrillation greater than 20 years ago and reports history of some type of ablation at [facility].  He had recurrent atrial fibrillation in August 2015, and in that setting, experienced chest pain.  He underwent diagnostic catheterization, which showed mild, nonobstructive CAD.  A-fib was previously managed with flecainide and Xarelto however, compliance was poor.  January 2017, he had recurrent chest pain and atrial fibrillation underwent stress testing which was nonischemic.  He also reported lower extremity claudication and underwent ABIs, which were abnormal suggestive of mild to moderate bilateral SFA and right profunda stenoses.  Recurrent atrial fibrillation August 2018 in the setting noncompliance was resumed on metoprolol, flecainide, and Xarelto however, was lost to follow-up.  He was admitted in March 2022, April 2022, in October 2022 in the setting of rapid atrial fibrillation and atypical chest pain in the setting of noncompliance.  It was felt that A-fib was likely transitioning to permanent A-fib, and a ZIO monitor was placed at discharge and subsequently showed 100% atrial fibrillation burden with an average heart rate of 92 bpm.  He did not follow-up after hospitalization but was last seen in cardiology clinic in March 2023.  At that time, he reported ongoing intermittent noncompliance and continued to drink a  case of beer a week and smoking half a pack of cigarettes daily.  Lab work at that time was unremarkable and his medications were refilled.  He was seen in the emergency department may have 2023 with atypical chest pain and an alcohol level of 273.  He requested admission for detox but then later asked to be discharged.  Home Medications    Current Outpatient Medications  Medication Sig Dispense Refill   apixaban (ELIQUIS) 5 MG TABS  tablet Take 1 tablet (5 mg total) by mouth 2 (two) times daily. 60 tablet 5   diltiazem (CARDIZEM CD) 180 MG 24 hr capsule Take 1 capsule (180 mg total) by mouth daily. 90 capsule 3   metoprolol tartrate (LOPRESSOR) 100 MG tablet Take 1 tablet (100 mg total) by mouth 2 (two) times daily. 180 tablet 3   pantoprazole (PROTONIX) 40 MG tablet Take 1 tablet (40 mg total) by mouth daily. 30 tablet 11   No current facility-administered medications for this visit.     Review of Systems    ***.  All other systems reviewed and are otherwise negative except as noted above.    Physical Exam    VS:  There were no vitals taken for this visit. , BMI There is no height or weight on file to calculate BMI.     GEN: Well nourished, well developed, in no acute distress. HEENT: normal. Neck: Supple, no JVD, carotid bruits, or masses. Cardiac: RRR, no murmurs, rubs, or gallops. No clubbing, cyanosis, edema.  Radials/DP/PT 2+ and equal bilaterally.  Respiratory:  Respirations regular and unlabored, clear to auscultation bilaterally. GI: Soft, nontender, nondistended, BS + x 4. MS: no deformity or atrophy. Skin: warm and dry, no rash. Neuro:  Strength and sensation are intact. Psych: Normal affect.  Accessory Clinical Findings    ECG personally reviewed by me today - *** - no acute changes.  Lab Results  Component Value Date   WBC 4.9 10/19/2021   HGB 12.7 (L) 10/19/2021   HCT 37.2 (L) 10/19/2021   MCV 93.9 10/19/2021   PLT 167 10/19/2021   Lab Results  Component Value Date   CREATININE 0.91 10/19/2021   BUN 7 (L) 10/19/2021   NA 132 (L) 10/19/2021   K 3.8 10/19/2021   CL 101 10/19/2021   CO2 22 10/19/2021   Lab Results  Component Value Date   ALT 47 (H) 10/19/2021   AST 55 (H) 10/19/2021   ALKPHOS 61 10/19/2021   BILITOT 0.5 10/19/2021   Lab Results  Component Value Date   CHOL 233 (H) 09/10/2020   HDL 47 09/10/2020   LDLCALC 131 (H) 09/10/2020   TRIG 145 03/09/2021   CHOLHDL  5.0 09/10/2020    Lab Results  Component Value Date   HGBA1C 6.1 07/30/2016    Assessment & Plan    1.  ***   Nicolasa Ducking, NP 04/10/2022, 2:55 PM

## 2022-04-21 IMAGING — DX DG CHEST 1V PORT
1 series · 2 of 2 positions shown · non-contrast
Comparison: Chest x-ray dated August 05, 2020.

CLINICAL DATA: Episode of chest pain and shortness of breath.

EXAM:
PORTABLE CHEST 1 VIEW

[Series 1: chest ap · 0.14mm/px · 2 of 2 slices shown]
[im 1/2]
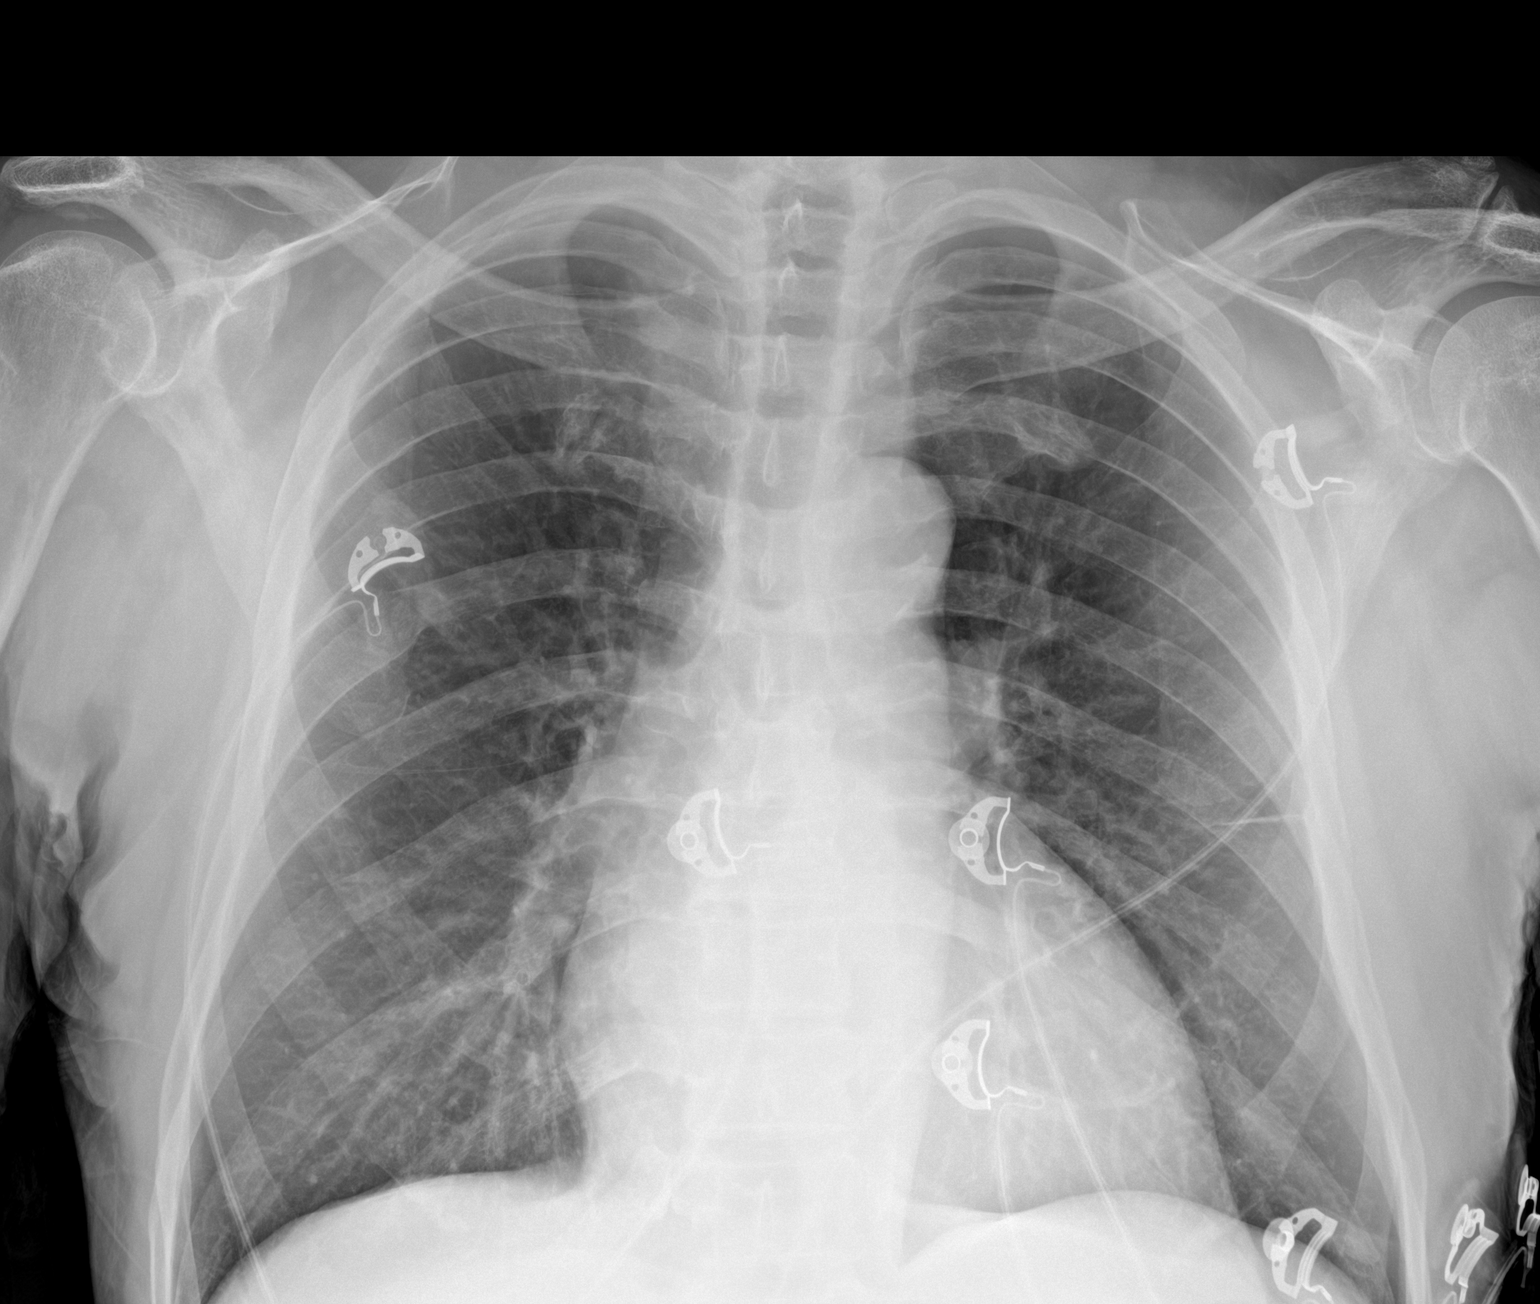
[im 2/2]
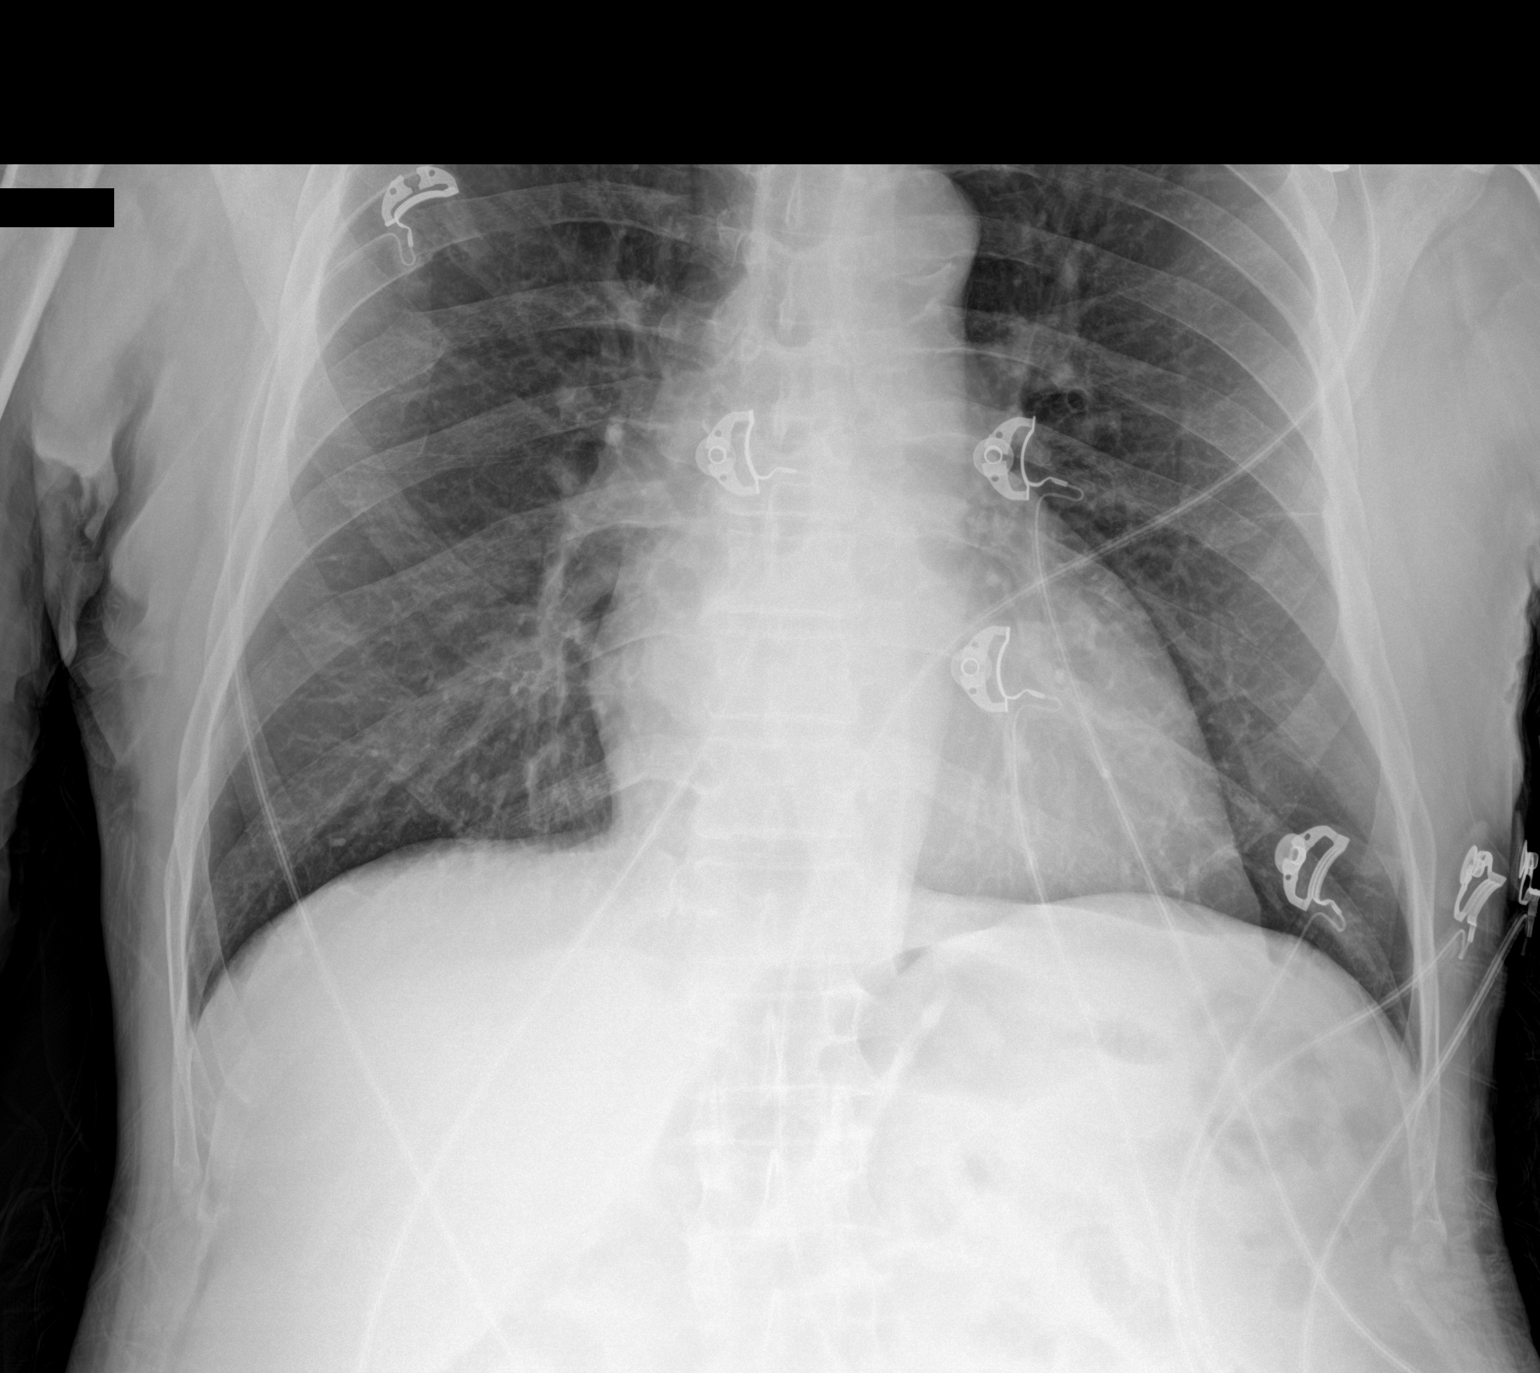

[2 of 2 positions shown; findings below may reference images not displayed]

FINDINGS: The heart size and mediastinal contours are within normal limits.
Both lungs are clear. The visualized skeletal structures are
unremarkable.
IMPRESSION: No active disease.

## 2022-05-19 ENCOUNTER — Other Ambulatory Visit: Payer: Self-pay

## 2022-05-19 ENCOUNTER — Inpatient Hospital Stay
Admission: EM | Admit: 2022-05-19 | Discharge: 2022-05-24 | DRG: 439 | Disposition: A | Discharge status: Home / Self Care | Payer: Medicare Other | Attending: Internal Medicine | Admitting: Internal Medicine

## 2022-05-19 ENCOUNTER — Inpatient Hospital Stay: Payer: Medicare Other

## 2022-05-19 ENCOUNTER — Emergency Department: Payer: Medicare Other

## 2022-05-19 DIAGNOSIS — F1910 Other psychoactive substance abuse, uncomplicated: Secondary | ICD-10-CM | POA: Diagnosis present

## 2022-05-19 DIAGNOSIS — I70203 Unspecified atherosclerosis of native arteries of extremities, bilateral legs: Secondary | ICD-10-CM | POA: Diagnosis present

## 2022-05-19 DIAGNOSIS — I482 Chronic atrial fibrillation, unspecified: Secondary | ICD-10-CM | POA: Diagnosis present

## 2022-05-19 DIAGNOSIS — Z7982 Long term (current) use of aspirin: Secondary | ICD-10-CM

## 2022-05-19 DIAGNOSIS — R0602 Shortness of breath: Secondary | ICD-10-CM | POA: Diagnosis present

## 2022-05-19 DIAGNOSIS — F1010 Alcohol abuse, uncomplicated: Secondary | ICD-10-CM | POA: Diagnosis present

## 2022-05-19 DIAGNOSIS — Z1152 Encounter for screening for COVID-19: Secondary | ICD-10-CM | POA: Diagnosis not present

## 2022-05-19 DIAGNOSIS — Z833 Family history of diabetes mellitus: Secondary | ICD-10-CM

## 2022-05-19 DIAGNOSIS — Z720 Tobacco use: Secondary | ICD-10-CM | POA: Diagnosis not present

## 2022-05-19 DIAGNOSIS — Z8249 Family history of ischemic heart disease and other diseases of the circulatory system: Secondary | ICD-10-CM | POA: Diagnosis not present

## 2022-05-19 DIAGNOSIS — K852 Alcohol induced acute pancreatitis without necrosis or infection: Principal | ICD-10-CM | POA: Diagnosis present

## 2022-05-19 DIAGNOSIS — K219 Gastro-esophageal reflux disease without esophagitis: Secondary | ICD-10-CM | POA: Diagnosis present

## 2022-05-19 DIAGNOSIS — Z79899 Other long term (current) drug therapy: Secondary | ICD-10-CM

## 2022-05-19 DIAGNOSIS — I2510 Atherosclerotic heart disease of native coronary artery without angina pectoris: Secondary | ICD-10-CM | POA: Diagnosis present

## 2022-05-19 DIAGNOSIS — F17210 Nicotine dependence, cigarettes, uncomplicated: Secondary | ICD-10-CM | POA: Diagnosis present

## 2022-05-19 DIAGNOSIS — I10 Essential (primary) hypertension: Secondary | ICD-10-CM | POA: Diagnosis present

## 2022-05-19 DIAGNOSIS — R059 Cough, unspecified: Secondary | ICD-10-CM | POA: Diagnosis present

## 2022-05-19 DIAGNOSIS — E785 Hyperlipidemia, unspecified: Secondary | ICD-10-CM | POA: Diagnosis present

## 2022-05-19 DIAGNOSIS — Y906 Blood alcohol level of 120-199 mg/100 ml: Secondary | ICD-10-CM | POA: Diagnosis present

## 2022-05-19 DIAGNOSIS — F1410 Cocaine abuse, uncomplicated: Secondary | ICD-10-CM | POA: Diagnosis present

## 2022-05-19 DIAGNOSIS — R109 Unspecified abdominal pain: Secondary | ICD-10-CM | POA: Diagnosis present

## 2022-05-19 LAB — LIPASE, BLOOD: Lipase: 388 U/L — ABNORMAL HIGH (ref 11–51)

## 2022-05-19 LAB — COMPREHENSIVE METABOLIC PANEL
ALT: 39 U/L (ref 0–44)
AST: 53 U/L — ABNORMAL HIGH (ref 15–41)
Albumin: 3.9 g/dL (ref 3.5–5.0)
Alkaline Phosphatase: 82 U/L (ref 38–126)
Anion gap: 9 (ref 5–15)
BUN: 6 mg/dL — ABNORMAL LOW (ref 8–23)
CO2: 25 mmol/L (ref 22–32)
Calcium: 8.9 mg/dL (ref 8.9–10.3)
Chloride: 99 mmol/L (ref 98–111)
Creatinine, Ser: 0.89 mg/dL (ref 0.61–1.24)
GFR, Estimated: >60 mL/min (ref >60)
Glucose, Bld: 81 mg/dL (ref 70–99)
Potassium: 4.1 mmol/L (ref 3.5–5.1)
Sodium: 133 mmol/L — ABNORMAL LOW (ref 135–145)
Total Bilirubin: 0.9 mg/dL (ref 0.3–1.2)
Total Protein: 7.2 g/dL (ref 6.5–8.1)

## 2022-05-19 LAB — CBC
HCT: 41 % (ref 39.0–52.0)
Hemoglobin: 14.3 g/dL (ref 13.0–17.0)
MCH: 31.8 pg (ref 26.0–34.0)
MCHC: 34.9 g/dL (ref 30.0–36.0)
MCV: 91.3 fL (ref 80.0–100.0)
Platelets: 194 10*3/uL (ref 150–400)
RBC: 4.49 MIL/uL (ref 4.22–5.81)
RDW: 15.9 % — ABNORMAL HIGH (ref 11.5–15.5)
WBC: 5.1 10*3/uL (ref 4.0–10.5)
nRBC: 0 % (ref 0.0–0.2)

## 2022-05-19 LAB — URINE DRUG SCREEN, QUALITATIVE (ARMC ONLY)
Amphetamines, Ur Screen: NOT DETECTED
Barbiturates, Ur Screen: NOT DETECTED
Benzodiazepine, Ur Scrn: NOT DETECTED
Cannabinoid 50 Ng, Ur ~~LOC~~: NOT DETECTED
Cocaine Metabolite,Ur ~~LOC~~: NOT DETECTED
MDMA (Ecstasy)Ur Screen: NOT DETECTED
Methadone Scn, Ur: NOT DETECTED
Opiate, Ur Screen: NOT DETECTED
Phencyclidine (PCP) Ur S: NOT DETECTED
Tricyclic, Ur Screen: NOT DETECTED

## 2022-05-19 LAB — URINALYSIS, ROUTINE W REFLEX MICROSCOPIC
Bacteria, UA: NONE SEEN
Bilirubin Urine: NEGATIVE
Glucose, UA: NEGATIVE mg/dL
Ketones, ur: NEGATIVE mg/dL
Leukocytes,Ua: NEGATIVE
Nitrite: NEGATIVE
Protein, ur: NEGATIVE mg/dL
Specific Gravity, Urine: 1.005 (ref 1.005–1.030)
pH: 6 (ref 5.0–8.0)

## 2022-05-19 LAB — HIV ANTIBODY (ROUTINE TESTING W REFLEX): HIV Screen 4th Generation wRfx: NONREACTIVE

## 2022-05-19 LAB — TRIGLYCERIDES: Triglycerides: 92 mg/dL (ref <150)

## 2022-05-19 LAB — TROPONIN I (HIGH SENSITIVITY)
Troponin I (High Sensitivity): 9 ng/L (ref <18)
Troponin I (High Sensitivity): 7 ng/L (ref <18)

## 2022-05-19 LAB — ETHANOL: Alcohol, Ethyl (B): 190 mg/dL — ABNORMAL HIGH (ref <10)

## 2022-05-19 MED ORDER — ASPIRIN 81 MG PO TBEC
162.0000 mg | DELAYED_RELEASE_TABLET | Freq: Every day | ORAL | Status: DC
  Administered 2022-05-19 – 2022-05-23 (×5): 162 mg via ORAL
  Filled 2022-05-19 (×6): qty 2

## 2022-05-19 MED ORDER — THIAMINE MONONITRATE 100 MG PO TABS
100.0000 mg | ORAL_TABLET | Freq: Every day | ORAL | Status: DC
  Administered 2022-05-19 – 2022-05-22 (×4): 100 mg via ORAL
  Filled 2022-05-19 (×6): qty 1

## 2022-05-19 MED ORDER — ORAL CARE MOUTH RINSE: 15.0000 mL | OROMUCOSAL | Status: DC | PRN

## 2022-05-19 MED ORDER — ALBUTEROL SULFATE (2.5 MG/3ML) 0.083% IN NEBU: 2.5000 mg | INHALATION_SOLUTION | RESPIRATORY_TRACT | Status: DC | PRN

## 2022-05-19 MED ORDER — OXYCODONE-ACETAMINOPHEN 5-325 MG PO TABS
1.0000 | ORAL_TABLET | ORAL | Status: DC | PRN
  Administered 2022-05-19 – 2022-05-23 (×9): 1 via ORAL
  Filled 2022-05-19 (×9): qty 1

## 2022-05-19 MED ORDER — HYDRALAZINE HCL 20 MG/ML IJ SOLN: 5.0000 mg | INTRAMUSCULAR | Status: DC | PRN

## 2022-05-19 MED ORDER — ONDANSETRON HCL 4 MG/2ML IJ SOLN
4.0000 mg | Freq: Once | INTRAMUSCULAR | Status: AC
  Administered 2022-05-19: 4 mg via INTRAVENOUS
  Filled 2022-05-19: qty 2

## 2022-05-19 MED ORDER — METOPROLOL TARTRATE 50 MG PO TABS
100.0000 mg | ORAL_TABLET | Freq: Two times a day (BID) | ORAL | Status: DC
  Administered 2022-05-19 – 2022-05-23 (×10): 100 mg via ORAL
  Filled 2022-05-19 (×5): qty 2
  Filled 2022-05-19: qty 4
  Filled 2022-05-19 (×5): qty 2

## 2022-05-19 MED ORDER — HYDROMORPHONE HCL 1 MG/ML IJ SOLN
0.5000 mg | Freq: Once | INTRAMUSCULAR | Status: AC
  Administered 2022-05-19: 0.5 mg via INTRAVENOUS
  Filled 2022-05-19: qty 0.5

## 2022-05-19 MED ORDER — LORAZEPAM 1 MG PO TABS: 0.0000 mg | ORAL_TABLET | Freq: Two times a day (BID) | ORAL | Status: DC

## 2022-05-19 MED ORDER — PANTOPRAZOLE SODIUM 40 MG PO TBEC
40.0000 mg | DELAYED_RELEASE_TABLET | Freq: Every day | ORAL | Status: DC
  Administered 2022-05-19 – 2022-05-23 (×5): 40 mg via ORAL
  Filled 2022-05-19 (×6): qty 1

## 2022-05-19 MED ORDER — IOHEXOL 300 MG/ML  SOLN
100.0000 mL | Freq: Once | INTRAMUSCULAR | Status: AC | PRN
  Administered 2022-05-19: 100 mL via INTRAVENOUS

## 2022-05-19 MED ORDER — ONDANSETRON HCL 4 MG/2ML IJ SOLN: 4.0000 mg | Freq: Three times a day (TID) | INTRAMUSCULAR | Status: DC | PRN

## 2022-05-19 MED ORDER — LORAZEPAM 1 MG PO TABS: 0.0000 mg | ORAL_TABLET | Freq: Four times a day (QID) | ORAL | Status: AC

## 2022-05-19 MED ORDER — THIAMINE HCL 100 MG/ML IJ SOLN: 100.0000 mg | Freq: Every day | INTRAMUSCULAR | Status: DC

## 2022-05-19 MED ORDER — DILTIAZEM HCL ER 90 MG PO CP12
90.0000 mg | ORAL_CAPSULE | Freq: Two times a day (BID) | ORAL | Status: DC
  Administered 2022-05-19 – 2022-05-23 (×10): 90 mg via ORAL
  Filled 2022-05-19 (×11): qty 1

## 2022-05-19 MED ORDER — SODIUM CHLORIDE 0.9 % IV BOLUS
1000.0000 mL | Freq: Once | INTRAVENOUS | Status: AC
  Administered 2022-05-19: 1000 mL via INTRAVENOUS

## 2022-05-19 MED ORDER — ACETAMINOPHEN 325 MG PO TABS: 650.0000 mg | ORAL_TABLET | Freq: Four times a day (QID) | ORAL | Status: DC | PRN

## 2022-05-19 MED ORDER — DILTIAZEM HCL ER COATED BEADS 180 MG PO CP24
180.0000 mg | ORAL_CAPSULE | Freq: Every day | ORAL | Status: DC
  Filled 2022-05-19: qty 1

## 2022-05-19 MED ORDER — LORAZEPAM 2 MG/ML IJ SOLN: 0.0000 mg | Freq: Two times a day (BID) | INTRAMUSCULAR | Status: DC

## 2022-05-19 MED ORDER — NICOTINE 21 MG/24HR TD PT24
21.0000 mg | MEDICATED_PATCH | Freq: Every day | TRANSDERMAL | Status: DC
  Filled 2022-05-19 (×6): qty 1

## 2022-05-19 MED ORDER — ENOXAPARIN SODIUM 40 MG/0.4ML IJ SOSY
40.0000 mg | PREFILLED_SYRINGE | INTRAMUSCULAR | Status: DC
  Administered 2022-05-19 – 2022-05-23 (×5): 40 mg via SUBCUTANEOUS
  Filled 2022-05-19 (×5): qty 0.4

## 2022-05-19 MED ORDER — LORAZEPAM 2 MG/ML IJ SOLN: 0.0000 mg | Freq: Four times a day (QID) | INTRAMUSCULAR | Status: AC

## 2022-05-19 MED ORDER — DM-GUAIFENESIN ER 30-600 MG PO TB12: 1.0000 | ORAL_TABLET | Freq: Two times a day (BID) | ORAL | Status: DC | PRN

## 2022-05-19 MED ORDER — SODIUM CHLORIDE 0.9 % IV SOLN: INTRAVENOUS | Status: DC

## 2022-05-19 NOTE — H&P (Signed)
History and Physical    Darren Rogers ZOX:[id number] DOB: 01-12-1956 DOA: 05/19/2022  Referring MD/NP/PA:   PCP: Pcp, No   Patient coming from:  The patient is coming from home.  At baseline, pt is independent for most of ADL.        Chief Complaint: abdominal pain  HPI: Darren Rogers is a 66 y.o. male with medical history significant of polysubstance use (cocaine, alcohol, tobacco), A fib not on anticoagulants, HTN, HLD, depression, CAD, claudication, BPH, PVC, who presents with abdominal pain.   He has abdominal pain for several days, which has worsened since yesterday.  It is located in the middle abdomen, constant, sharp, severe, 10 out of 10 in severity, nonradiating.  Associated with nausea, nonbilious nonbloody vomiting once, no diarrhea.  Denies fever or chills. He states he drinks 3 to 440 ounce beers daily.  Patient has mild dry cough, mild shortness of breath intermittently, no chest pain.  Denies symptoms of UTI. Patient states that he stopped taking Eliquis for A-fib because it causes nosebleeding to him.  He refused to restart Eliquis.    Data reviewed independently and ED Course: pt was found to have lipase 388, triglyceride level 92, WBC 5.1, alcohol level 190, troponin level 9, 7, GFR> 60, AST 53 otherwise normal liver function, temperature normal, blood pressure 108/87, heart rate 88, RR 18, oxygen saturation 99% on room air.  Patient is admitted to telemetry bed as inpatient.   EKG: I have personally reviewed.  Atrial fibrillation, QTc 456, T wave inversion in V4-V6   Review of Systems:   General: no fevers, chills, no body weight gain, has poor appetite, has fatigue HEENT: no blurry vision, hearing changes or sore throat Respiratory: has dyspnea, coughing, no wheezing CV: no chest pain, no palpitations GI: has nausea, vomiting, abdominal pain, no diarrhea, constipation GU: no dysuria, burning on urination, increased urinary frequency, hematuria  Ext: no leg  edema Neuro: no unilateral weakness, numbness, or tingling, no vision change or hearing loss Skin: no rash, no skin tear. MSK: No muscle spasm, no deformity, no limitation of range of movement in spin Heme: No easy bruising.  Travel history: No recent long distant travel.   Allergy: No Known Allergies  Past Medical History:  Diagnosis Date   Claudication (HCC)    a. 06/2015 ABI: R - 0.73, L - 0.73. 30-49% bilat SFA stenosis. 50-74% R Profunda stenosis; b. 01/2017 ABI: R 0.89, L 0.88, TBI R 0.65, L 1.0.   Erectile dysfunction    Essential hypertension    History of echocardiogram    a. 03/29/2014: EF 55-60%, mild LVH, normal RVSP;  b. 05/2015 Echo: EF 60-65%, triv AI, mild MR; c. 03/2021 Echo: EF 55-60%, no rwma, mild LVH, nl RV fxn, mildly dil LA, mild MR.   Hyperlipidemia    Non-obstructive CAD    a. 01/27/2014 Cath: LM nl, LAD mild diff dz w/o obs, LCX no sig obs - scattered 20-30% mLCx, RCA no obs dz, EF 70%; b. 06/2015 MV; EF 60%, no ischemia; c. 07/2020 MV: No ischemia/scar.   Persistent atrial fibrillation (HCC)    a. Pt says Dx >51yrs ago w/ ? RFCA @ [facility];  b. Recurrent 12/2013;  b. Rx Flecainide and Xarelto-->Intermittent compliance; c. 03/2021 Zio: 100% Afib w/ avg rate of 99 bpm. Rare PVCs.   PVC's (premature ventricular contractions)    a. 05/2017 Zio: 4000 PVCs in 48 hrs (2%). Brief run of SVT.   Tobacco abuse    a.  ongoing - 1 ppd.    Past Surgical History:  Procedure Laterality Date   CARDIAC CATHETERIZATION     CARDIAC CATHETERIZATION     [facility]   LEFT HEART CATH Right 01/27/2014   Procedure: LEFT HEART CATH;  Surgeon: Micheline Chapman, MD;  Location[facility] C[facility] LAB;  Service: Cardiovascular;  Laterality: Right;    Social History:  reports that he has been smoking cigarettes. He has a 15.00 pack-year smoking history. He has never used smokeless tobacco. He reports current alcohol use. He reports that he does not currently use drugs after having used the following drugs:  Cocaine.  Family History:  Family History  Problem Relation Age of Onset   Coronary artery disease Father 60   Diabetes Mother    Diabetes Sister    Diabetes Brother    Diabetes Maternal Grandmother    Diabetes Sister    Diabetes Sister      Prior to Admission medications   Medication Sig Start Date End Date Taking? Authorizing Provider  apixaban (ELIQUIS) 5 MG TABS tablet Take 1 tablet (5 mg total) by mouth 2 (two) times daily. 10/14/21 01/12/22  Iran Ouch, MD  diltiazem (CARDIZEM CD) 180 MG 24 hr capsule Take 1 capsule (180 mg total) by mouth daily. 08/26/21   Creig Hines, NP  metoprolol tartrate (LOPRESSOR) 100 MG tablet Take 1 tablet (100 mg total) by mouth 2 (two) times daily. 08/26/21   Creig Hines, NP  pantoprazole (PROTONIX) 40 MG tablet Take 1 tablet (40 mg total) by mouth daily. 08/26/21   Creig Hines, NP    Physical Exam: Vitals:   05/19/22 0730 05/19/22 0900 05/19/22 0908 05/19/22 1200  BP: 118/84 (!) 141/88 (!) 141/88 (!) 142/85  Pulse: (!) 55 (!) 104 (!) 104 (!) 103  Resp:   18 20  Temp:   98.3 F (36.8 C)   TempSrc:   Oral   SpO2: 91% 95% 95% 95%  Weight:      Height:       General: Not in acute distress HEENT:       Eyes: PERRL, EOMI, no scleral icterus.       ENT: No discharge from the ears and nose, no pharynx injection, no tonsillar enlargement.        Neck: No JVD, no bruit, no mass felt. Heme: No neck lymph node enlargement. Cardiac: S1/S2, RRR, No murmurs, No gallops or rubs. Respiratory: No rales, wheezing, rhonchi or rubs. GI: Soft, nondistended, has central abdominal tenderness, no rebound pain, no organomegaly, BS present. GU: No hematuria Ext: No pitting leg edema bilaterally. 1+DP/PT pulse bilaterally. Musculoskeletal: No joint deformities, No joint redness or warmth, no limitation of ROM in spin. Skin: No rashes.  Neuro: Alert, oriented X3, cranial nerves II-XII grossly intact, moves all  extremities normally.  Psych: Patient is not psychotic, no suicidal or hemocidal ideation.  Labs on Admission: I have personally reviewed following labs and imaging studies  CBC: Recent Labs  Lab 05/19/22 0408  WBC 5.1  HGB 14.3  HCT 41.0  MCV 91.3  PLT 194   Basic Metabolic Panel: Recent Labs  Lab 05/19/22 0408  NA 133*  K 4.1  CL 99  CO2 25  GLUCOSE 81  BUN 6*  CREATININE 0.89  CALCIUM 8.9   GFR: Estimated Creatinine Clearance: 72.7 mL/min (by C-G formula based on SCr of 0.89 mg/dL). Liver Function Tests: Recent Labs  Lab 05/19/22 0408  AST 53*  ALT 39  ALKPHOS 82  BILITOT 0.9  PROT 7.2  ALBUMIN 3.9   Recent Labs  Lab 05/19/22 0408  LIPASE 388*   No results for input(s): "AMMONIA" in the last 168 hours. Coagulation Profile: No results for input(s): "INR", "PROTIME" in the last 168 hours. Cardiac Enzymes: No results for input(s): "CKTOTAL", "CKMB", "CKMBINDEX", "TROPONINI" in the last 168 hours. BNP (last 3 results) No results for input(s): "PROBNP" in the last 8760 hours. HbA1C: No results for input(s): "HGBA1C" in the last 72 hours. CBG: No results for input(s): "GLUCAP" in the last 168 hours. Lipid Profile: Recent Labs    05/19/22 0641  TRIG 92   Thyroid Function Tests: No results for input(s): "TSH", "T4TOTAL", "FREET4", "T3FREE", "THYROIDAB" in the last 72 hours. Anemia Panel: No results for input(s): "VITAMINB12", "FOLATE", "FERRITIN", "TIBC", "IRON", "RETICCTPCT" in the last 72 hours. Urine analysis:    Component Value Date/Time   COLORURINE YELLOW (A) 05/19/2022 0408   APPEARANCEUR CLEAR (A) 05/19/2022 0408   APPEARANCEUR Clear 07/30/2016 1600   LABSPEC 1.005 05/19/2022 0408   LABSPEC 1.019 03/28/2014 1430   PHURINE 6.0 05/19/2022 0408   GLUCOSEU NEGATIVE 05/19/2022 0408   GLUCOSEU Negative 03/28/2014 1430   HGBUR SMALL (A) 05/19/2022 0408   BILIRUBINUR NEGATIVE 05/19/2022 0408   BILIRUBINUR Negative 07/30/2016 1600    BILIRUBINUR Negative 03/28/2014 1430   KETONESUR NEGATIVE 05/19/2022 0408   PROTEINUR NEGATIVE 05/19/2022 0408   NITRITE NEGATIVE 05/19/2022 0408   LEUKOCYTESUR NEGATIVE 05/19/2022 0408   LEUKOCYTESUR Negative 03/28/2014 1430   Sepsis Labs: @LABRCNTIP (procalcitonin:4,lacticidven:4) )No results found for this or any previous visit (from the past 240 hour(s)).   Radiological Exams on Admission: CT ABDOMEN PELVIS W CONTRAST  Result Date: 05/19/2022 CLINICAL DATA:  Pancreatitis, acute, severe. Upper abdominal pain for a few days. EXAM: CT ABDOMEN AND PELVIS WITH CONTRAST TECHNIQUE: Multidetector CT imaging of the abdomen and pelvis was performed using the standard protocol following bolus administration of intravenous contrast. RADIATION DOSE REDUCTION: This exam was performed according to the departmental dose-optimization program which includes automated exposure control, adjustment of the mA and/or kV according to patient size and/or use of iterative reconstruction technique. CONTRAST:  05/21/2022 OMNIPAQUE IOHEXOL 300 MG/ML  SOLN COMPARISON:  CT abdomen dated 03/09/2021. FINDINGS: Lower chest: Mild bibasilar atelectasis. Hepatobiliary: Liver is diffusely low in density indicating fatty infiltration. Gallbladder is unremarkable. No bile duct dilatation is seen. Pancreas: Ill-defined fluid/edema surrounding pancreatic head and proximal duodenum, consistent with acute pancreatitis. Mid and distal portions of the pancreas are unremarkable. No pseudocyst formation seen. Spleen: Normal in size without focal abnormality. Adrenals/Urinary Tract: Adrenal glands are unremarkable. Kidneys are unremarkable without suspicious mass, stone or hydronephrosis. Bladder appears normal. Stomach/Bowel: No dilated large or small bowel loops. Appendix appears normal. Thickening of the walls of the duodenum and of the small bowel in the RIGHT abdomen, likely reactive to the pancreatitis. Vascular/Lymphatic: No abdominal aortic  aneurysm. Extensive aortic atherosclerosis. No acute-appearing vascular abnormality. No enlarged lymph nodes are seen. Reproductive: Prostate is unremarkable. Other: No abscess collection is seen.  No free intraperitoneal air. Musculoskeletal: Degenerative spondylosis of the lower lumbar spine, at least moderate in degree with associated disc space narrowings and osteophyte formation. No acute-appearing osseous abnormality. IMPRESSION: 1. Acute pancreatitis. Associated fluid/edema surrounding the pancreatic head and the adjacent duodenum. Associated small amount of free fluid within the retroperitoneum and RIGHT lower quadrant. No pseudocyst formation seen. 2. Thickening of the walls of the duodenum and of the bowel in the RIGHT lower abdomen, likely  reactive to the pancreatitis. No bowel obstruction. 3. Fatty infiltration of the liver. Electronically Signed   By: Bary Richard M.D.   On: 05/19/2022 10:19      Assessment/Plan Principal Problem:   Alcoholic pancreatitis Active Problems:   CAD (coronary artery disease)   Essential hypertension   Atrial fibrillation, chronic (HCC)   Polysubstance abuse (HCC)   Tobacco abuse   Alcohol abuse   Cocaine abuse (HCC)   GERD (gastroesophageal reflux disease)   Assessment and Plan:  Alcoholic pancreatitis: Lipase is 388, most likely due to alcohol abuse.  Patient's symptoms are severe.  TG level 92.  Will get abdominal imaging.  -will admit to tele bed as inpt -NPO for pancreatitis -IVF: 1LNS and then at 125 cc/hr -prn IV percocet and tylenol for pain control -prn IV zofran for nausea -f/u Ct-abdomen/pelvis  Addendum - CT-abd/pelvis showed: 1. Acute pancreatitis. Associated fluid/edema surrounding the pancreatic head and the adjacent duodenum. Associated small amount of free fluid within the retroperitoneum and RIGHT lower quadrant. No pseudocyst formation seen. 2. Thickening of the walls of the duodenum and of the bowel in the RIGHT lower  abdomen, likely reactive to the pancreatitis. No bowel obstruction. 3. Fatty infiltration of the liver.  CAD (coronary artery disease): -ASA   Essential hypertension -IV hydralazine as needed -Cardizem, metoprolol  Atrial fibrillation, chronic (HCC): Heart rate 88 -Continue Cardizem and metoprolol -on ASA 162 mg daily per pt  Polysubstance abuse (HCC), Tobacco abuse, Alcohol abuse, Cocaine abuse (HCC): -did counseling about importance of quitting substance use.  I spent lengthy time with pt to have talked with patient about this issue.  He promised that he will make efforts in quitting substance use. -Nicotine patch -CIWA protocol -Check UDS  GERD: -protonix       DVT ppx: SQ Lovenox  Code Status: Full code  Family Communication: not done, no family member is at bed side.    Disposition Plan:  Anticipate discharge back to previous environment  Consults called:  none  Admission status and Level of care: Telemetry Medical:    as inpt     Dispo: The patient is from: Home              Anticipated d/c is to: Home              Anticipated d/c date is: 2 days              Patient currently is not medically stable to d/c.    Severity of Illness:  The appropriate patient status for this patient is INPATIENT. Inpatient status is judged to be reasonable and necessary in order to provide the required intensity of service to ensure the patient's safety. The patient's presenting symptoms, physical exam findings, and initial radiographic and laboratory data in the context of their chronic comorbidities is felt to place them at high risk for further clinical deterioration. Furthermore, it is not anticipated that the patient will be medically stable for discharge from the hospital within 2 midnights of admission.   * I certify that at the point of admission it is my clinical judgment that the patient will require inpatient hospital care spanning beyond 2 midnights from the point  of admission due to high intensity of service, high risk for further deterioration and high frequency of surveillance required.*       Date of Service 05/19/2022    Lorretta Harp [facility]   If 7PM-7AM, please contact night-coverage [email or web address] 05/19/2022, 1:36 PM

## 2022-05-19 NOTE — ED Provider Notes (Signed)
[facility] Provider Note    Event Date/Time   First MD Initiated Contact with Patient 05/19/22 [phone]     (approximate)   History   Abdominal Pain   HPI  Darren Rogers is a 66 y.o. male with history of A-fib on blood thinner who comes in with concern for upper abdominal pain.  Patient reports a few days of upper abdominal pain.  He does report drinking daily alcohol.  He states he drinks 3 to 440 ounce beers daily.  Does report history of withdrawal but denies any seizures.  He states that he was able to stop drinking in the past and go without alcohol for 7 years.  He states that he wants to stop drinking today.  He reports the pain is located in the upper abdomen.   Physical Exam   Triage Vital Signs: ED Triage Vitals  Enc Vitals Group     BP 05/19/22 0405 108/87     Pulse Rate 05/19/22 0405 88     Resp 05/19/22 0405 18     Temp 05/19/22 0405 98.3 F (36.8 C)     Temp Source 05/19/22 0405 Oral     SpO2 05/19/22 0356 99 %     Weight 05/19/22 0358 156 lb (70.8 kg)     Height 05/19/22 0358 _0  (1.676 m)     Head Circumference --      Peak Flow --      Pain Score 05/19/22 0405 8     Pain Loc --      Pain Edu? --      Excl. in [place]? --     Most recent vital signs: Vitals:   05/19/22 0356 05/19/22 0405  BP:  108/87  Pulse:  88  Resp:  18  Temp:  98.3 F (36.8 C)  SpO2: 99% 99%     General: Awake, no distress.  CV:  Good peripheral perfusion.  Resp:  Normal effort.  Abd:  No distention. Soft and non tender Other:     ED Results / Procedures / Treatments   Labs (all labs ordered are listed, but only abnormal results are displayed) Labs Reviewed  LIPASE, BLOOD - Abnormal; Notable for the following components:      Result Value   Lipase 388 (*)    All other components within normal limits  COMPREHENSIVE METABOLIC PANEL - Abnormal; Notable for the following components:   Sodium 133 (*)    BUN 6 (*)    AST 53 (*)    All other  components within normal limits  CBC - Abnormal; Notable for the following components:   RDW 15.9 (*)    All other components within normal limits  URINALYSIS, ROUTINE W REFLEX MICROSCOPIC - Abnormal; Notable for the following components:   Color, Urine YELLOW (*)    APPearance CLEAR (*)    Hgb urine dipstick SMALL (*)    All other components within normal limits  ETHANOL - Abnormal; Notable for the following components:   Alcohol, Ethyl (B) 190 (*)    All other components within normal limits  TROPONIN I (HIGH SENSITIVITY)  TROPONIN I (HIGH SENSITIVITY)     EKG  My interpretation of EKG:  A-fib rate of 84 without any ST elevation, T wave inversions in V4 V5 and V6.  Normal intervals     PROCEDURES:  Critical Care performed: No  Procedures   MEDICATIONS ORDERED IN ED: Medications  sodium chloride 0.9 % bolus 1,000 mL (  has no administration in time range)  ondansetron (ZOFRAN) injection 4 mg (has no administration in time range)  HYDROmorphone (DILAUDID) injection 0.5 mg (has no administration in time range)  LORazepam (ATIVAN) injection 0-4 mg (has no administration in time range)    Or  LORazepam (ATIVAN) tablet 0-4 mg (has no administration in time range)  LORazepam (ATIVAN) injection 0-4 mg (has no administration in time range)    Or  LORazepam (ATIVAN) tablet 0-4 mg (has no administration in time range)  thiamine (VITAMIN B1) tablet 100 mg (has no administration in time range)    Or  thiamine (VITAMIN B1) injection 100 mg (has no administration in time range)     IMPRESSION / MDM / ASSESSMENT AND PLAN / ED COURSE  I reviewed the triage vital signs and the nursing notes.   Patient's presentation is most consistent with acute presentation with potential threat to life or bodily function.   Patient comes in with concerns for upper abdominal pain in the setting of EtOH abuse.  Workup ordered to evaluate for pancreatitis, ACS.  EtOH is elevated.  Lipase  elevated at 388.  CMP shows slight elevation of AST.  CBC normal.  No signs of choledocholithiasis given normal T. bili and normal alk phos.  Suspect this is related to alcoholic pancreatitis.  Patient does report wanting to stop drinking.  He is willing to be admitted to the hospital for fluids, pain management and alcohol withdrawal treatment.     FINAL CLINICAL IMPRESSION(S) / ED DIAGNOSES   Final diagnoses:  Alcohol-induced acute pancreatitis, unspecified complication status  ETOH abuse     Rx / DC Orders   ED Discharge Orders     None        Note:  This document was prepared using Dragon voice recognition software and may include unintentional dictation errors.   Vanessa Coloma, MD 05/19/22 [phone]

## 2022-05-19 NOTE — ED Triage Notes (Signed)
Pt also c/o shortness of breath and dizziness beginning this morning.

## 2022-05-19 NOTE — ED Triage Notes (Signed)
Pt arrives via [facility] from home with CC of mid upper abd pain that began yesterday morning. Denies radiation and is tender to palpation. Reports daily alcohol use. Reports worsening of chronic R upper leg pain.

## 2022-05-20 DIAGNOSIS — I10 Essential (primary) hypertension: Secondary | ICD-10-CM | POA: Diagnosis not present

## 2022-05-20 DIAGNOSIS — F1010 Alcohol abuse, uncomplicated: Secondary | ICD-10-CM | POA: Diagnosis not present

## 2022-05-20 DIAGNOSIS — F1910 Other psychoactive substance abuse, uncomplicated: Secondary | ICD-10-CM

## 2022-05-20 DIAGNOSIS — K852 Alcohol induced acute pancreatitis without necrosis or infection: Secondary | ICD-10-CM | POA: Diagnosis not present

## 2022-05-20 DIAGNOSIS — I2510 Atherosclerotic heart disease of native coronary artery without angina pectoris: Secondary | ICD-10-CM

## 2022-05-20 DIAGNOSIS — I482 Chronic atrial fibrillation, unspecified: Secondary | ICD-10-CM | POA: Diagnosis not present

## 2022-05-20 LAB — GLUCOSE, CAPILLARY
Glucose-Capillary: 70 mg/dL (ref 70–99)
Glucose-Capillary: 94 mg/dL (ref 70–99)

## 2022-05-20 LAB — COMPREHENSIVE METABOLIC PANEL
ALT: 26 U/L (ref 0–44)
AST: 31 U/L (ref 15–41)
Albumin: 3.4 g/dL — ABNORMAL LOW (ref 3.5–5.0)
Alkaline Phosphatase: 78 U/L (ref 38–126)
Anion gap: 8 (ref 5–15)
BUN: 9 mg/dL (ref 8–23)
CO2: 22 mmol/L (ref 22–32)
Calcium: 8.4 mg/dL — ABNORMAL LOW (ref 8.9–10.3)
Chloride: 104 mmol/L (ref 98–111)
Creatinine, Ser: 0.97 mg/dL (ref 0.61–1.24)
GFR, Estimated: >60 mL/min (ref >60)
Glucose, Bld: 65 mg/dL — ABNORMAL LOW (ref 70–99)
Potassium: 4.8 mmol/L (ref 3.5–5.1)
Sodium: 134 mmol/L — ABNORMAL LOW (ref 135–145)
Total Bilirubin: 1.4 mg/dL — ABNORMAL HIGH (ref 0.3–1.2)
Total Protein: 6.5 g/dL (ref 6.5–8.1)

## 2022-05-20 LAB — LIPASE, BLOOD: Lipase: 171 U/L — ABNORMAL HIGH (ref 11–51)

## 2022-05-20 MED ORDER — ATORVASTATIN CALCIUM 20 MG PO TABS
80.0000 mg | ORAL_TABLET | Freq: Every day | ORAL | Status: DC
  Administered 2022-05-20 – 2022-05-23 (×4): 80 mg via ORAL
  Filled 2022-05-20 (×6): qty 4

## 2022-05-20 MED ORDER — INFLUENZA VAC A&B SA ADJ QUAD 0.5 ML IM PRSY
0.5000 mL | PREFILLED_SYRINGE | INTRAMUSCULAR | Status: DC
  Filled 2022-05-20: qty 0.5

## 2022-05-20 MED ORDER — PNEUMOCOCCAL 20-VAL CONJ VACC 0.5 ML IM SUSY: 0.5000 mL | PREFILLED_SYRINGE | INTRAMUSCULAR | Status: DC

## 2022-05-20 MED ORDER — DEXTROSE-NACL 5-0.9 % IV SOLN: INTRAVENOUS | Status: DC

## 2022-05-20 NOTE — Assessment & Plan Note (Signed)
Aspirin and Lipitor prescribed.

## 2022-05-20 NOTE — Assessment & Plan Note (Addendum)
On Cardizem CD and metoprolol.  Will discontinue IV fluids and see if blood pressure decreases.

## 2022-05-20 NOTE — Assessment & Plan Note (Addendum)
Pain control.  Try to advance to solid food for dinner.  Lipase down and pain a little bit less.  Once advancing to solid food will discontinue IV fluids.

## 2022-05-20 NOTE — Assessment & Plan Note (Addendum)
No signs of alcohol withdrawal during the hospital course.  Advised not to drink any further alcohol or pancreatitis may recur.

## 2022-05-20 NOTE — Discharge Instructions (Signed)
                  Intensive Outpatient Programs  [facility]    [facility] [address]     [address] [place],  [place]     [place], [place] [phone]      [phone]  [facility]   [facility]  (Inpatient and outpatient)  [phone] (Suboxone and Methadone) [address]           [phone]           [facility]    [facility] - Intensive Outpatient [address]     [address] [place], [place] [address]     [place], [place]  [phone]      [phone]  [facility] (Outpatient, Inpatient, [facility]  [facility] (Groups and [facility]) (insurance only) [phone]    [place], [place]          [phone]       [facility]    [facility] (for caregivers and family) [address]     [address] [place], [place]     [place], [place] [phone]      [phone]  Residential Treatment Programs  [facility]  [facility](2 years) Residential: 90 days)  [facility] [address]      [address] [place], [place]     [place], [place] [phone]      [phone] or [phone]  [facility]    [facility] [address]      [address] [place], [place]     [place], [place] [phone]      [phone]  [facility]   [facility] ([facility] [address]     [address] [place], [place] [address]     [place], [place] [phone]      [phone] Admissions: 8am-3pm M-F  [facility]: Residential Program (90 Days)              [facility]:  [facility]  [place], [place]     [place], [place]  [phone] or [phone]    (Walk in Hours over the weekend or by referral)   [facility]: [facility]:[phone] (for crisis  response 24 hours a day) 

## 2022-05-20 NOTE — Progress Notes (Signed)
Progress Note   Patient: Darren Rogers [id number]:[id number] DOB: [birth year] DOA: 05/19/2022     1 DOS: the patient was seen and examined on 05/20/2022   Brief hospital course: 66 y.o. male with medical history significant of polysubstance use (cocaine, alcohol, tobacco), A fib not on anticoagulants, HTN, HLD, depression, CAD, claudication, BPH, PVC, who presents with abdominal pain.    He has abdominal pain for several days, which has worsened since yesterday.  It is located in the middle abdomen, constant, sharp, severe, 10 out of 10 in severity, nonradiating.  Associated with nausea, nonbilious nonbloody vomiting once, no diarrhea.  Denies fever or chills. He states he drinks 3 to 440 ounce beers daily.  Patient has mild dry cough, mild shortness of breath intermittently, no chest pain.  Denies symptoms of UTI. Patient states that he stopped taking Eliquis for A-fib because it causes nosebleeding to him.  He refused to restart Eliquis.  Assessment and Plan: * Alcoholic pancreatitis Patient feeling better today and in less pain.  He wanted to start eating.  Patient started on clear liquid diet this morning will advance to full liquid diet this evening.  Lipase came down from 388 down to 171.  Advised to stop drinking alcohol.  Alcohol abuse Alcohol withdrawal protocol.  Atrial fibrillation, chronic (HCC) On Cardizem CD and metoprolol for rate control.  Not on any anticoagulation.  Essential hypertension On Cardizem CD and metoprolol  Polysubstance abuse (HCC) Urine toxicology negative but alcohol level elevated 190  CAD (coronary artery disease) Aspirin and Lipitor prescribed.  Tobacco abuse Nicotine patch        Subjective: Patient feeling better today than yesterday and wanting to eat.  Started on liquid diet today.  Admitted with pancreatitis.  Physical Exam: Vitals:   05/19/22 1459 05/19/22 1541 05/19/22 2307 05/20/22 0734  BP: (!) 154/94 (!) 128/109 (!) 153/102 (!) 148/111   Pulse: (!) 118 62 70 74  Resp: 16 16 18 16   Temp:   97.6 F (36.4 C) 98 F (36.7 C)  TempSrc:      SpO2: 97% 97% 98% 97%  Weight:      Height:       Physical Exam HENT:     Head: Normocephalic.     Mouth/Throat:     Pharynx: No oropharyngeal exudate.  Eyes:     General: Lids are normal.     Conjunctiva/sclera: Conjunctivae normal.  Cardiovascular:     Rate and Rhythm: Normal rate and regular rhythm.     Heart sounds: Normal heart sounds, S1 normal and S2 normal.  Pulmonary:     Breath sounds: No decreased breath sounds, wheezing, rhonchi or rales.  Abdominal:     Palpations: Abdomen is soft.     Tenderness: There is abdominal tenderness in the epigastric area.  Musculoskeletal:     Right lower leg: No swelling.     Left lower leg: No swelling.  Skin:    General: Skin is warm.     Findings: No rash.  Neurological:     Mental Status: He is alert and oriented to person, place, and time.     Data Reviewed: Sodium 134, creatinine 0.97, total bilirubin 1.4, lipase 171 CT abdomen pelvis showing acute pancreatitis with fluid surrounding the pancreatic head  Disposition: Status is: Inpatient Remains inpatient appropriate because: Advancing diet slowly for acute pancreatitis.  Continue IV fluids  Planned Discharge Destination: Home    Time spent: 28 minutes  Author: , MD 05/20/2022 3:38 PM  For on call review [email or web address].

## 2022-05-20 NOTE — Assessment & Plan Note (Signed)
Urine toxicology negative but alcohol level elevated 190

## 2022-05-20 NOTE — Assessment & Plan Note (Signed)
On Cardizem CD and metoprolol for rate control.  Not on any anticoagulation.

## 2022-05-20 NOTE — Assessment & Plan Note (Signed)
-  Nicotine patch 

## 2022-05-20 NOTE — Hospital Course (Addendum)
66 y.o. male with medical history significant of polysubstance use (cocaine, alcohol, tobacco), A fib not on anticoagulants, HTN, HLD, depression, CAD, claudication, BPH, PVC, who presents with abdominal pain.    He has abdominal pain for several days, which has worsened since yesterday.  It is located in the middle abdomen, constant, sharp, severe, 10 out of 10 in severity, nonradiating.  Associated with nausea, nonbilious nonbloody vomiting once, no diarrhea.  Denies fever or chills. He states he drinks 3 to 440 ounce beers daily.  Patient has mild dry cough, mild shortness of breath intermittently, no chest pain.  Denies symptoms of UTI. Patient states that he stopped taking Eliquis for A-fib because it causes nosebleeding to him.  12/21.  Patient with worsening abdominal pain since last night.  He did not want to eat any breakfast so he is made n.p.o. again for breakfast and lunch.  Lipase slightly higher than yesterday.  Clear liquid diet started in the evening. 12/22.  Patient interested in advancing to full liquid diet. 12/23.  Patient wants to continue full liquid diet through lunch and advance to solid foods this evening.  Feeling little bit better. 12/24.  Patient feeling better tolerating solid food.  He has a prescription for oxycodone at home already.

## 2022-05-21 DIAGNOSIS — I10 Essential (primary) hypertension: Secondary | ICD-10-CM | POA: Diagnosis not present

## 2022-05-21 DIAGNOSIS — K852 Alcohol induced acute pancreatitis without necrosis or infection: Secondary | ICD-10-CM | POA: Diagnosis not present

## 2022-05-21 DIAGNOSIS — F1010 Alcohol abuse, uncomplicated: Secondary | ICD-10-CM | POA: Diagnosis not present

## 2022-05-21 DIAGNOSIS — I482 Chronic atrial fibrillation, unspecified: Secondary | ICD-10-CM | POA: Diagnosis not present

## 2022-05-21 LAB — CBC
HCT: 33.7 % — ABNORMAL LOW (ref 39.0–52.0)
Hemoglobin: 11.8 g/dL — ABNORMAL LOW (ref 13.0–17.0)
MCH: 32.2 pg (ref 26.0–34.0)
MCHC: 35 g/dL (ref 30.0–36.0)
MCV: 91.8 fL (ref 80.0–100.0)
Platelets: 156 10*3/uL (ref 150–400)
RBC: 3.67 MIL/uL — ABNORMAL LOW (ref 4.22–5.81)
RDW: 15.8 % — ABNORMAL HIGH (ref 11.5–15.5)
WBC: 4.8 10*3/uL (ref 4.0–10.5)
nRBC: 0 % (ref 0.0–0.2)

## 2022-05-21 LAB — COMPREHENSIVE METABOLIC PANEL
ALT: 21 U/L (ref 0–44)
AST: 26 U/L (ref 15–41)
Albumin: 3.1 g/dL — ABNORMAL LOW (ref 3.5–5.0)
Alkaline Phosphatase: 67 U/L (ref 38–126)
Anion gap: 6 (ref 5–15)
BUN: 7 mg/dL — ABNORMAL LOW (ref 8–23)
CO2: 24 mmol/L (ref 22–32)
Calcium: 8.2 mg/dL — ABNORMAL LOW (ref 8.9–10.3)
Chloride: 106 mmol/L (ref 98–111)
Creatinine, Ser: 0.85 mg/dL (ref 0.61–1.24)
GFR, Estimated: >60 mL/min (ref >60)
Glucose, Bld: 140 mg/dL — ABNORMAL HIGH (ref 70–99)
Potassium: 3.8 mmol/L (ref 3.5–5.1)
Sodium: 136 mmol/L (ref 135–145)
Total Bilirubin: 1 mg/dL (ref 0.3–1.2)
Total Protein: 6.1 g/dL — ABNORMAL LOW (ref 6.5–8.1)

## 2022-05-21 LAB — LIPASE, BLOOD: Lipase: 193 U/L — ABNORMAL HIGH (ref 11–51)

## 2022-05-21 LAB — GLUCOSE, CAPILLARY
Glucose-Capillary: 148 mg/dL — ABNORMAL HIGH (ref 70–99)
Glucose-Capillary: 151 mg/dL — ABNORMAL HIGH (ref 70–99)

## 2022-05-21 MED ORDER — BISACODYL 5 MG PO TBEC
5.0000 mg | DELAYED_RELEASE_TABLET | Freq: Once | ORAL | Status: AC
  Administered 2022-05-21: 5 mg via ORAL
  Filled 2022-05-21: qty 1

## 2022-05-21 MED ORDER — FOLIC ACID 1 MG PO TABS
1.0000 mg | ORAL_TABLET | Freq: Every day | ORAL | Status: DC
  Administered 2022-05-21 – 2022-05-23 (×3): 1 mg via ORAL
  Filled 2022-05-21 (×4): qty 1

## 2022-05-21 NOTE — Progress Notes (Signed)
Progress Note   Patient: Darren Rogers [id number]:[id number] DOB: [birth year] DOA: 05/19/2022     2 DOS: the patient was seen and examined on 05/21/2022   Brief hospital course: 66 y.o. male with medical history significant of polysubstance use (cocaine, alcohol, tobacco), A fib not on anticoagulants, HTN, HLD, depression, CAD, claudication, BPH, PVC, who presents with abdominal pain.    He has abdominal pain for several days, which has worsened since yesterday.  It is located in the middle abdomen, constant, sharp, severe, 10 out of 10 in severity, nonradiating.  Associated with nausea, nonbilious nonbloody vomiting once, no diarrhea.  Denies fever or chills. He states he drinks 3 to 440 ounce beers daily.  Patient has mild dry cough, mild shortness of breath intermittently, no chest pain.  Denies symptoms of UTI. Patient states that he stopped taking Eliquis for A-fib because it causes nosebleeding to him.  12/21.  Patient with worsening abdominal pain since last night.  He did not want to eat any breakfast so he is made n.p.o. again.  Lipase slightly higher than yesterday.  Assessment and Plan: * Alcoholic pancreatitis Patient feeling worse today and wanted to go without food.  Patient made NPO.  Lipase slightly higher than yesterday at 193.  Pain control and IV fluids.  Alcohol abuse Alcohol withdrawal protocol.  Atrial fibrillation, chronic (HCC) On Cardizem CD and metoprolol for rate control.  Not on any anticoagulation.  Essential hypertension On Cardizem CD and metoprolol  Polysubstance abuse (HCC) Urine toxicology negative but alcohol level elevated 190  CAD (coronary artery disease) Aspirin and Lipitor prescribed.  Tobacco abuse Nicotine patch        Subjective: Patient had more abdominal pain starting last night after full liquid diet.  He did not want to eat breakfast this morning and n.p.o. status was ordered.  Admitted with acute pancreatitis.  Physical Exam: Vitals:    05/20/22 2343 05/20/22 2344 05/21/22 0857 05/21/22 1454  BP:  (!) 146/92 (!) 154/95 (!) 141/96  Pulse:  83 67 79  Resp:  18 16 16   Temp: 98 F (36.7 C) 98.4 F (36.9 C) 98.4 F (36.9 C) 98.1 F (36.7 C)  TempSrc: Oral     SpO2:  94% 97% 95%  Weight:      Height:       Physical Exam HENT:     Head: Normocephalic.     Mouth/Throat:     Pharynx: No oropharyngeal exudate.  Eyes:     General: Lids are normal.     Conjunctiva/sclera: Conjunctivae normal.  Cardiovascular:     Rate and Rhythm: Normal rate and regular rhythm.     Heart sounds: Normal heart sounds, S1 normal and S2 normal.  Pulmonary:     Breath sounds: No decreased breath sounds, wheezing, rhonchi or rales.  Abdominal:     Palpations: Abdomen is soft.     Tenderness: There is abdominal tenderness in the epigastric area.  Musculoskeletal:     Right lower leg: No swelling.     Left lower leg: No swelling.  Skin:    General: Skin is warm.     Findings: No rash.  Neurological:     Mental Status: He is alert and oriented to person, place, and time.     Data Reviewed: Creatinine 0.85, lipase slightly up at 193, hemoglobin 11.8, platelet count 156  Disposition: Status is: Inpatient Remains inpatient appropriate because: Made n.p.o. again.  Continue to treat acute pancreatitis.  Planned Discharge Destination: Home  Time spent: 28 minutes  Author: Alford Highland, MD 05/21/2022 3:23 PM  For on call review [email or web address].

## 2022-05-21 NOTE — TOC Initial Note (Signed)
Transition of Care [facility]) - Initial/Assessment Note    Patient Details  Name: Darren Rogers MRN: [id number] Date of Birth: 08-10-1955  Transition of Care [facility]) CM/SW Contact:    Marlowe Sax, RN Phone Number: 05/21/2022, 11:27 AM  Clinical Narrative:                  Substance Use resources and Alcohol Abuse resources added to the AVS, no additonal needs identified for this patient   Transition of Care [facility]) Screening Note   Patient Details  Name: Darren Rogers Date of Birth: [birth year]   Transition of Care [facility]) CM/SW Contact:    Marlowe Sax, RN Phone Number: 05/21/2022, 11:28 AM    Transition of Care Department [facility]) has reviewed patient and no TOC needs have been identified at this time. We will continue to monitor patient advancement through interdisciplinary progression rounds. If new patient transition needs arise, please place a TOC consult.         Patient Goals and CMS Choice            Expected Discharge Plan and Services                                                Prior Living Arrangements/Services                       Activities of Daily Living Home Assistive Devices/Equipment: None ADL Screening (condition at time of admission) Patient's cognitive ability adequate to safely complete daily activities?: Yes Is the patient deaf or have difficulty hearing?: No Does the patient have difficulty seeing, even when wearing glasses/contacts?: No Does the patient have difficulty concentrating, remembering, or making decisions?: No Patient able to express need for assistance with ADLs?: Yes Does the patient have difficulty dressing or bathing?: No Independently performs ADLs?: Yes (appropriate for developmental age) Does the patient have difficulty walking or climbing stairs?: No Weakness of Legs: None Weakness of Arms/Hands: None  Permission Sought/Granted                  Emotional Assessment               Admission diagnosis:  ETOH abuse [F10.10] Alcoholic pancreatitis [K85.20] Alcohol-induced acute pancreatitis, unspecified complication status [K85.20] Patient Active Problem List   Diagnosis Date Noted   Atrial fibrillation, chronic (HCC) 05/19/2022   GERD (gastroesophageal reflux disease) 05/19/2022   AKI (acute kidney injury) (HCC) 03/10/2021   Lactic acidosis 03/10/2021   Acute encephalopathy 03/10/2021   Symptomatic bradycardia 03/09/2021   Abdominal pain, RUQ    Alcoholic pancreatitis 12/07/2020   Non compliance w medication regimen 12/07/2020   Atrial fibrillation (HCC) 11/20/2020   Abscess of buccal space of mouth 11/19/2020   Dental abscess 11/19/2020   Facial cellulitis_left 11/19/2020   Cellulitis of face    Atrial fibrillation with RVR (HCC) 08/06/2020   Demand ischemia    Polysubstance abuse (HCC)    Rapid atrial fibrillation (HCC) 08/05/2020   Alcohol use disorder, moderate, dependence (HCC) 08/05/2020   Cocaine abuse (HCC) 08/05/2020   Alcohol abuse 10/20/2019   Depression 10/20/2019   A-fib (HCC) 01/15/2017   Personal history of tobacco use, presenting hazards to health 08/17/2016   BPH associated with nocturia 07/30/2016   IFG (impaired fasting glucose) 07/30/2016   Other chest pain 08/22/2015  Essential hypertension    PAD (peripheral artery disease) (HCC)    Drug-induced erectile dysfunction 01/22/2015   CAD (coronary artery disease) 04/11/2014   CAD in native artery 04/11/2014   Paroxysmal atrial fibrillation ([place])    Tobacco abuse    Hyperlipidemia    Atrial fibrillation with rapid ventricular response ([place]) 01/27/2014   PCP:  Merryl Hacker, No Pharmacy:   [place], [place] [address] Phone: [phone] Fax: [place] [address] Phone: [phone] Fax: [phone]     Social  Determinants of Health (SDOH) Social History: SDOH Screenings   Food Insecurity: No Food Insecurity (05/19/2022)  Housing: Low Risk  (05/19/2022)  Transportation Needs: No Transportation Needs (05/19/2022)  Utilities: Not At Risk (05/19/2022)  Tobacco Use: High Risk (05/19/2022)   SDOH Interventions:     Readmission Risk Interventions    10/13/2020   11:28 AM  Readmission Risk Prevention Plan  Transportation Screening Complete  Palliative Care Screening Not Applicable  Medication Review (RN Care Manager) Complete

## 2022-05-22 DIAGNOSIS — F1010 Alcohol abuse, uncomplicated: Secondary | ICD-10-CM | POA: Diagnosis not present

## 2022-05-22 DIAGNOSIS — K852 Alcohol induced acute pancreatitis without necrosis or infection: Secondary | ICD-10-CM | POA: Diagnosis not present

## 2022-05-22 DIAGNOSIS — I482 Chronic atrial fibrillation, unspecified: Secondary | ICD-10-CM | POA: Diagnosis not present

## 2022-05-22 DIAGNOSIS — I10 Essential (primary) hypertension: Secondary | ICD-10-CM | POA: Diagnosis not present

## 2022-05-22 LAB — GLUCOSE, CAPILLARY: Glucose-Capillary: 135 mg/dL — ABNORMAL HIGH (ref 70–99)

## 2022-05-22 NOTE — Plan of Care (Signed)
  Problem: Clinical Measurements: Goal: Ability to maintain clinical measurements within normal limits will improve Outcome: Progressing   Problem: Clinical Measurements: Goal: Will remain free from infection Outcome: Progressing   Problem: Activity: Goal: Risk for activity intolerance will decrease Outcome: Progressing   Problem: Nutrition: Goal: Adequate nutrition will be maintained Outcome: Progressing   Problem: Coping: Goal: Level of anxiety will decrease Outcome: Progressing   Problem: Elimination: Goal: Will not experience complications related to bowel motility Outcome: Progressing   Problem: Elimination: Goal: Will not experience complications related to urinary retention Outcome: Progressing

## 2022-05-22 NOTE — Progress Notes (Signed)
Progress Note   Patient: Darren Rogers [id number]:[id number] DOB: August 27, 1955 DOA: 05/19/2022     3 DOS: the patient was seen and examined on 05/22/2022   Brief hospital course: 66 y.o. male with medical history significant of polysubstance use (cocaine, alcohol, tobacco), A fib not on anticoagulants, HTN, HLD, depression, CAD, claudication, BPH, PVC, who presents with abdominal pain.    He has abdominal pain for several days, which has worsened since yesterday.  It is located in the middle abdomen, constant, sharp, severe, 10 out of 10 in severity, nonradiating.  Associated with nausea, nonbilious nonbloody vomiting once, no diarrhea.  Denies fever or chills. He states he drinks 3 to 440 ounce beers daily.  Patient has mild dry cough, mild shortness of breath intermittently, no chest pain.  Denies symptoms of UTI. Patient states that he stopped taking Eliquis for A-fib because it causes nosebleeding to him.  12/21.  Patient with worsening abdominal pain since last night.  He did not want to eat any breakfast so he is made n.p.o. again for breakfast and lunch.  Lipase slightly higher than yesterday.  Clear liquid diet started in the evening. 12/22.  Patient interested in advancing to full liquid diet.  Assessment and Plan: * Alcoholic pancreatitis Pain control, IV fluids.  Try to advance to full liquid diet today.  Alcohol abuse Alcohol withdrawal protocol.  No signs of withdrawal currently.  Atrial fibrillation, chronic (HCC) On Cardizem CD and metoprolol for rate control.  Not on any anticoagulation.  Essential hypertension On Cardizem CD and metoprolol  Polysubstance abuse (HCC) Urine toxicology negative but alcohol level elevated 190  CAD (coronary artery disease) Aspirin and Lipitor prescribed.  Tobacco abuse Nicotine patch        Subjective: Patient feels a little bit better than yesterday.  Interested in trying to advance to full liquid diet today.  Admitted with acute  pancreatitis.  Physical Exam: Vitals:   05/21/22 0857 05/21/22 1454 05/21/22 2150 05/22/22 0900  BP: (!) 154/95 (!) 141/96 (!) 169/120 (!) 164/113  Pulse: 67 79 (!) 110 (!) 101  Resp: 16 16 18 16   Temp: 98.4 F (36.9 C) 98.1 F (36.7 C) 98.4 F (36.9 C) 98.3 F (36.8 C)  TempSrc:   Oral Oral  SpO2: 97% 95% 100% 96%  Weight:      Height:       Physical Exam HENT:     Head: Normocephalic.     Mouth/Throat:     Pharynx: No oropharyngeal exudate.  Eyes:     General: Lids are normal.     Conjunctiva/sclera: Conjunctivae normal.  Cardiovascular:     Rate and Rhythm: Normal rate and regular rhythm.     Heart sounds: Normal heart sounds, S1 normal and S2 normal.  Pulmonary:     Breath sounds: No decreased breath sounds, wheezing, rhonchi or rales.  Abdominal:     Palpations: Abdomen is soft.     Tenderness: There is abdominal tenderness in the epigastric area.  Musculoskeletal:     Right lower leg: No swelling.     Left lower leg: No swelling.  Skin:    General: Skin is warm.     Findings: No rash.  Neurological:     Mental Status: He is alert and oriented to person, place, and time.     Data Reviewed: No new data today   Disposition: Status is: Inpatient Remains inpatient appropriate because: Slow to progress.  Advance to full liquid diet today.  Planned Discharge Destination:  Home    Time spent: 27 minutes  Author: Alford Highland, MD 05/22/2022 3:28 PM  For on call review [email or web address].

## 2022-05-22 NOTE — Care Management Important Message (Signed)
Important Message  Patient Details  Name: Darren Rogers MRN: [id number] Date of Birth: [birth year]   Medicare Important Message Given:  Yes     Olegario Messier A [name] 05/22/2022, 10:28 AM

## 2022-05-22 NOTE — Plan of Care (Signed)

## 2022-05-23 DIAGNOSIS — I10 Essential (primary) hypertension: Secondary | ICD-10-CM | POA: Diagnosis not present

## 2022-05-23 DIAGNOSIS — I482 Chronic atrial fibrillation, unspecified: Secondary | ICD-10-CM | POA: Diagnosis not present

## 2022-05-23 DIAGNOSIS — K852 Alcohol induced acute pancreatitis without necrosis or infection: Secondary | ICD-10-CM | POA: Diagnosis not present

## 2022-05-23 DIAGNOSIS — F1010 Alcohol abuse, uncomplicated: Secondary | ICD-10-CM | POA: Diagnosis not present

## 2022-05-23 LAB — GLUCOSE, CAPILLARY
Glucose-Capillary: 100 mg/dL — ABNORMAL HIGH (ref 70–99)
Glucose-Capillary: 106 mg/dL — ABNORMAL HIGH (ref 70–99)
Glucose-Capillary: 115 mg/dL — ABNORMAL HIGH (ref 70–99)

## 2022-05-23 LAB — BASIC METABOLIC PANEL
Anion gap: 8 (ref 5–15)
BUN: <5 mg/dL — ABNORMAL LOW (ref 8–23)
CO2: 23 mmol/L (ref 22–32)
Calcium: 8.8 mg/dL — ABNORMAL LOW (ref 8.9–10.3)
Chloride: 104 mmol/L (ref 98–111)
Creatinine, Ser: 0.99 mg/dL (ref 0.61–1.24)
GFR, Estimated: >60 mL/min (ref >60)
Glucose, Bld: 98 mg/dL (ref 70–99)
Potassium: 3.4 mmol/L — ABNORMAL LOW (ref 3.5–5.1)
Sodium: 135 mmol/L (ref 135–145)

## 2022-05-23 LAB — CBC
HCT: 34.7 % — ABNORMAL LOW (ref 39.0–52.0)
Hemoglobin: 12 g/dL — ABNORMAL LOW (ref 13.0–17.0)
MCH: 32.1 pg (ref 26.0–34.0)
MCHC: 34.6 g/dL (ref 30.0–36.0)
MCV: 92.8 fL (ref 80.0–100.0)
Platelets: 172 10*3/uL (ref 150–400)
RBC: 3.74 MIL/uL — ABNORMAL LOW (ref 4.22–5.81)
RDW: 15.9 % — ABNORMAL HIGH (ref 11.5–15.5)
WBC: 4.8 10*3/uL (ref 4.0–10.5)
nRBC: 0 % (ref 0.0–0.2)

## 2022-05-23 LAB — RESP PANEL BY RT-PCR (RSV, FLU A&B, COVID)  RVPGX2
Influenza A by PCR: NEGATIVE
Influenza B by PCR: NEGATIVE
Resp Syncytial Virus by PCR: NEGATIVE
SARS Coronavirus 2 by RT PCR: NEGATIVE

## 2022-05-23 LAB — LIPASE, BLOOD: Lipase: 86 U/L — ABNORMAL HIGH (ref 11–51)

## 2022-05-23 MED ORDER — OXYCODONE-ACETAMINOPHEN 5-325 MG PO TABS
1.0000 | ORAL_TABLET | ORAL | Status: DC | PRN
  Administered 2022-05-23: 1 via ORAL
  Filled 2022-05-23: qty 1

## 2022-05-23 MED ORDER — BISACODYL 5 MG PO TBEC
5.0000 mg | DELAYED_RELEASE_TABLET | Freq: Once | ORAL | Status: AC
  Administered 2022-05-23: 5 mg via ORAL
  Filled 2022-05-23: qty 1

## 2022-05-23 NOTE — Progress Notes (Signed)
Progress Note   Patient: Darren Rogers [id number]:[id number] DOB: 01-14-1956 DOA: 05/19/2022     4 DOS: the patient was seen and examined on 05/23/2022   Brief hospital course: 66 y.o. male with medical history significant of polysubstance use (cocaine, alcohol, tobacco), A fib not on anticoagulants, HTN, HLD, depression, CAD, claudication, BPH, PVC, who presents with abdominal pain.    He has abdominal pain for several days, which has worsened since yesterday.  It is located in the middle abdomen, constant, sharp, severe, 10 out of 10 in severity, nonradiating.  Associated with nausea, nonbilious nonbloody vomiting once, no diarrhea.  Denies fever or chills. He states he drinks 3 to 440 ounce beers daily.  Patient has mild dry cough, mild shortness of breath intermittently, no chest pain.  Denies symptoms of UTI. Patient states that he stopped taking Eliquis for A-fib because it causes nosebleeding to him.  12/21.  Patient with worsening abdominal pain since last night.  He did not want to eat any breakfast so he is made n.p.o. again for breakfast and lunch.  Lipase slightly higher than yesterday.  Clear liquid diet started in the evening. 12/22.  Patient interested in advancing to full liquid diet. 12/23.  Patient wants to continue full liquid diet through lunch and advance to solid foods this evening.  Feeling little bit better.  Assessment and Plan: * Alcoholic pancreatitis Pain control.  Try to advance to solid food for dinner.  Lipase down and pain a little bit less.  Once advancing to solid food will discontinue IV fluids.  Alcohol abuse Alcohol withdrawal protocol.  No signs of withdrawal currently.  Atrial fibrillation, chronic (HCC) On Cardizem CD and metoprolol for rate control.  Not on any anticoagulation.  Essential hypertension On Cardizem CD and metoprolol.  Will discontinue IV fluids and see if blood pressure decreases.  Polysubstance abuse (HCC) Urine toxicology negative but  alcohol level elevated 190  CAD (coronary artery disease) Aspirin and Lipitor prescribed.  Tobacco abuse Nicotine patch        Subjective: Patient seen this morning and wanted to do liquid diet for lunch and advance to solid food for dinner and see how that goes.  Admitted with acute pancreatitis.  Still having some abdominal pain but better than when he came in.  Physical Exam: Vitals:   05/22/22 0900 05/22/22 1743 05/22/22 2359 05/23/22 0756  BP: (!) 164/113 (!) 146/103 (!) 153/103 (!) 151/90  Pulse: (!) 101 68 99 62  Resp: 16 16 18    Temp: 98.3 F (36.8 C) 97.8 F (36.6 C) 97.9 F (36.6 C) 98.1 F (36.7 C)  TempSrc: Oral Oral Oral   SpO2: 96% 97% 98% 98%  Weight:      Height:       Physical Exam HENT:     Head: Normocephalic.     Mouth/Throat:     Pharynx: No oropharyngeal exudate.  Eyes:     General: Lids are normal.     Conjunctiva/sclera: Conjunctivae normal.  Cardiovascular:     Rate and Rhythm: Normal rate and regular rhythm.     Heart sounds: Normal heart sounds, S1 normal and S2 normal.  Pulmonary:     Breath sounds: No decreased breath sounds, wheezing, rhonchi or rales.  Abdominal:     Palpations: Abdomen is soft.     Tenderness: There is abdominal tenderness in the epigastric area.  Musculoskeletal:     Right lower leg: No swelling.     Left lower leg: No swelling.  Skin:    General: Skin is warm.     Findings: No rash.  Neurological:     Mental Status: He is alert and oriented to person, place, and time.     Data Reviewed: Potassium 3.4, creatinine 0.99, lipase down to 86, hemoglobin 12  Disposition: Status is: Inpatient Remains inpatient appropriate because:   Planned Discharge Destination: Home    Time spent: 27 minutes  Author: Loletha Grayer, MD 05/23/2022 2:37 PM  For on call review [email or web address]

## 2022-05-24 DIAGNOSIS — F1010 Alcohol abuse, uncomplicated: Secondary | ICD-10-CM | POA: Diagnosis not present

## 2022-05-24 DIAGNOSIS — K852 Alcohol induced acute pancreatitis without necrosis or infection: Secondary | ICD-10-CM | POA: Diagnosis not present

## 2022-05-24 DIAGNOSIS — I10 Essential (primary) hypertension: Secondary | ICD-10-CM | POA: Diagnosis not present

## 2022-05-24 DIAGNOSIS — I482 Chronic atrial fibrillation, unspecified: Secondary | ICD-10-CM | POA: Diagnosis not present

## 2022-05-24 LAB — GLUCOSE, CAPILLARY
Glucose-Capillary: 88 mg/dL (ref 70–99)
Glucose-Capillary: 95 mg/dL (ref 70–99)

## 2022-05-24 MED ORDER — VITAMIN B-1 100 MG PO TABS: 100.0000 mg | ORAL_TABLET | Freq: Every day | ORAL | 0 refills | Status: DC

## 2022-05-24 MED ORDER — POLYETHYLENE GLYCOL 3350 17 G PO PACK: 17.0000 g | PACK | Freq: Every day | ORAL | 0 refills | Status: DC | PRN

## 2022-05-24 MED ORDER — ASPIRIN 81 MG PO TBEC: 81.0000 mg | DELAYED_RELEASE_TABLET | Freq: Every day | ORAL | 0 refills | Status: DC

## 2022-05-24 MED ORDER — FOLIC ACID 1 MG PO TABS: 1.0000 mg | ORAL_TABLET | Freq: Every day | ORAL | 0 refills | Status: DC

## 2022-05-24 MED ORDER — OXYCODONE HCL 5 MG PO TABS: 5.0000 mg | ORAL_TABLET | Freq: Three times a day (TID) | ORAL | 0 refills | Status: DC | PRN

## 2022-05-24 MED ORDER — NICOTINE 21 MG/24HR TD PT24: MEDICATED_PATCH | TRANSDERMAL | 0 refills | Status: DC

## 2022-05-24 MED ORDER — ATORVASTATIN CALCIUM 40 MG PO TABS: 40.0000 mg | ORAL_TABLET | Freq: Every day | ORAL | 0 refills | Status: DC

## 2022-05-24 NOTE — Discharge Summary (Signed)
Physician Discharge Summary   Patient: Darren Rogers MRN: [id number] DOB: 10/18/1955  Admit date:     05/19/2022  Discharge date: 05/24/22  Discharge Physician: Loletha Grayer   PCP: Pcp, No   Recommendations at discharge:   Follow-up PCP 5 days  Discharge Diagnoses: Principal Problem:   Alcoholic pancreatitis Active Problems:   Alcohol abuse   Essential hypertension   Atrial fibrillation, chronic (HCC)   Polysubstance abuse (HCC)   Tobacco abuse   CAD (coronary artery disease)   Cocaine abuse ([place])   GERD (gastroesophageal reflux disease)   Hospital Course: 66 y.o. male with medical history significant of polysubstance use (cocaine, alcohol, tobacco), A fib not on anticoagulants, HTN, HLD, depression, CAD, claudication, BPH, PVC, who presents with abdominal pain.    He has abdominal pain for several days, which has worsened since yesterday.  It is located in the middle abdomen, constant, sharp, severe, 10 out of 10 in severity, nonradiating.  Associated with nausea, nonbilious nonbloody vomiting once, no diarrhea.  Denies fever or chills. He states he drinks 3 to 440 ounce beers daily.  Patient has mild dry cough, mild shortness of breath intermittently, no chest pain.  Denies symptoms of UTI. Patient states that he stopped taking Eliquis for A-fib because it causes nosebleeding to him.  12/21.  Patient with worsening abdominal pain since last night.  He did not want to eat any breakfast so he is made n.p.o. again for breakfast and lunch.  Lipase slightly higher than yesterday.  Clear liquid diet started in the evening. 12/22.  Patient interested in advancing to full liquid diet. 12/23.  Patient wants to continue full liquid diet through lunch and advance to solid foods this evening.  Feeling little bit better.  Assessment and Plan: * Alcoholic pancreatitis Pain control.  Try to advance to solid food for dinner.  Lipase down and pain a little bit less.  Once advancing to solid  food will discontinue IV fluids.  Alcohol abuse Alcohol withdrawal protocol.  No signs of withdrawal currently.  Atrial fibrillation, chronic (HCC) On Cardizem CD and metoprolol for rate control.  Not on any anticoagulation.  Essential hypertension On Cardizem CD and metoprolol.  Will discontinue IV fluids and see if blood pressure decreases.  Polysubstance abuse (HCC) Urine toxicology negative but alcohol level elevated 190  CAD (coronary artery disease) Aspirin and Lipitor prescribed.  Tobacco abuse Nicotine patch         Consultants: None Procedures performed: None Disposition: Home Diet recommendation:  Cardiac diet DISCHARGE MEDICATION: Allergies as of 05/24/2022   No Known Allergies      Medication List     STOP taking these medications    apixaban 5 MG Tabs tablet Commonly known as: ELIQUIS       TAKE these medications    aspirin EC 81 MG tablet Take 1 tablet (81 mg total) by mouth daily. Swallow whole.   atorvastatin 40 MG tablet Commonly known as: LIPITOR Take 1 tablet (40 mg total) by mouth daily. What changed:  medication strength how much to take   diltiazem 180 MG 24 hr capsule Commonly known as: CARDIZEM CD Take 1 capsule (180 mg total) by mouth daily.   folic acid 1 MG tablet Commonly known as: FOLVITE Take 1 tablet (1 mg total) by mouth daily.   metoprolol tartrate 100 MG tablet Commonly known as: LOPRESSOR Take 1 tablet (100 mg total) by mouth 2 (two) times daily.   nicotine 21 mg/24hr patch Commonly known as:  NICODERM CQ - dosed in mg/24 hours One 21 mg patch chest wall daily (okay to substitute generic)   oxyCODONE 5 MG immediate release tablet Commonly known as: Roxicodone Take 1 tablet (5 mg total) by mouth every 8 (eight) hours as needed for severe pain.   pantoprazole 40 MG tablet Commonly known as: PROTONIX Take 1 tablet (40 mg total) by mouth daily.   polyethylene glycol 17 g packet Commonly known as:  MIRALAX / GLYCOLAX Take 17 g by mouth daily as needed.   thiamine 100 MG tablet Commonly known as: Vitamin B-1 Take 1 tablet (100 mg total) by mouth daily.        Follow-up Information     Lavera Guise, MD Follow up in 2 week(s).   Specialties: Internal Medicine, Cardiology Contact information: [place] [address] [phone]                Discharge Exam: Danley Danker Weights   05/19/22 0358  Weight: 70.8 kg   Physical Exam HENT:     Head: Normocephalic.     Mouth/Throat:     Pharynx: No oropharyngeal exudate.  Eyes:     General: Lids are normal.     Conjunctiva/sclera: Conjunctivae normal.  Cardiovascular:     Rate and Rhythm: Normal rate and regular rhythm.     Heart sounds: Normal heart sounds, S1 normal and S2 normal.  Pulmonary:     Breath sounds: No decreased breath sounds, wheezing, rhonchi or rales.  Abdominal:     Palpations: Abdomen is soft.     Tenderness: There is no abdominal tenderness.  Musculoskeletal:     Right lower leg: No swelling.     Left lower leg: No swelling.  Skin:    General: Skin is warm.     Findings: No rash.  Neurological:     Mental Status: He is alert and oriented to person, place, and time.      Condition at discharge: stable  The results of significant diagnostics from this hospitalization (including imaging, microbiology, ancillary and laboratory) are listed below for reference.   Imaging Studies: CT ABDOMEN PELVIS W CONTRAST  Result Date: 05/19/2022 CLINICAL DATA:  Pancreatitis, acute, severe. Upper abdominal pain for a few days. EXAM: CT ABDOMEN AND PELVIS WITH CONTRAST TECHNIQUE: Multidetector CT imaging of the abdomen and pelvis was performed using the standard protocol following bolus administration of intravenous contrast. RADIATION DOSE REDUCTION: This exam was performed according to the departmental dose-optimization program which includes automated exposure control, adjustment of the mA  and/or kV according to patient size and/or use of iterative reconstruction technique. CONTRAST:  162mL OMNIPAQUE IOHEXOL 300 MG/ML  SOLN COMPARISON:  CT abdomen dated 03/09/2021. FINDINGS: Lower chest: Mild bibasilar atelectasis. Hepatobiliary: Liver is diffusely low in density indicating fatty infiltration. Gallbladder is unremarkable. No bile duct dilatation is seen. Pancreas: Ill-defined fluid/edema surrounding pancreatic head and proximal duodenum, consistent with acute pancreatitis. Mid and distal portions of the pancreas are unremarkable. No pseudocyst formation seen. Spleen: Normal in size without focal abnormality. Adrenals/Urinary Tract: Adrenal glands are unremarkable. Kidneys are unremarkable without suspicious mass, stone or hydronephrosis. Bladder appears normal. Stomach/Bowel: No dilated large or small bowel loops. Appendix appears normal. Thickening of the walls of the duodenum and of the small bowel in the RIGHT abdomen, likely reactive to the pancreatitis. Vascular/Lymphatic: No abdominal aortic aneurysm. Extensive aortic atherosclerosis. No acute-appearing vascular abnormality. No enlarged lymph nodes are seen. Reproductive: Prostate is unremarkable. Other: No abscess collection is seen.  No free intraperitoneal air. Musculoskeletal: Degenerative spondylosis of the lower lumbar spine, at least moderate in degree with associated disc space narrowings and osteophyte formation. No acute-appearing osseous abnormality. IMPRESSION: 1. Acute pancreatitis. Associated fluid/edema surrounding the pancreatic head and the adjacent duodenum. Associated small amount of free fluid within the retroperitoneum and RIGHT lower quadrant. No pseudocyst formation seen. 2. Thickening of the walls of the duodenum and of the bowel in the RIGHT lower abdomen, likely reactive to the pancreatitis. No bowel obstruction. 3. Fatty infiltration of the liver. Electronically Signed   By: Bary  M.D.   On: 05/19/2022 10:19     Microbiology: Results for orders placed or performed during the hospital encounter of 05/19/22  Resp panel by RT-PCR (RSV, Flu A&B, Covid) Anterior Nasal Swab     Status: None   Collection Time: 05/23/22 11:23 AM   Specimen: Anterior Nasal Swab  Result Value Ref Range Status   SARS Coronavirus 2 by RT PCR NEGATIVE NEGATIVE Final    Comment: (NOTE) SARS-CoV-2 target nucleic acids are NOT DETECTED.  The SARS-CoV-2 RNA is generally detectable in upper respiratory specimens during the acute phase of infection. The lowest concentration of SARS-CoV-2 viral copies this assay can detect is 138 copies/mL. A negative result does not preclude SARS-Cov-2 infection and should not be used as the sole basis for treatment or other patient management decisions. A negative result may occur with  improper specimen collection/handling, submission of specimen other than nasopharyngeal swab, presence of viral mutation(s) within the areas targeted by this assay, and inadequate number of viral copies(<138 copies/mL). A negative result must be combined with clinical observations, patient history, and epidemiological information. The expected result is Negative.  Fact Sheet for Patients:  BloggerCourse.com  Fact Sheet for Healthcare Providers:  SeriousBroker.it  This test is no t yet approved or cleared by the [place] FDA and  has been authorized for detection and/or diagnosis of SARS-CoV-2 by FDA under an Emergency Use Authorization (EUA). This EUA will remain  in effect (meaning this test can be used) for the duration of the COVID-19 declaration under Section 564(b)(1) of the Act, 21 U.S.C.section 360bbb-3(b)(1), unless the authorization is terminated  or revoked sooner.       Influenza A by PCR NEGATIVE NEGATIVE Final   Influenza B by PCR NEGATIVE NEGATIVE Final    Comment: (NOTE) The Xpert Xpress SARS-CoV-2/FLU/RSV plus assay is intended  as an aid in the diagnosis of influenza from Nasopharyngeal swab specimens and should not be used as a sole basis for treatment. Nasal washings and aspirates are unacceptable for Xpert Xpress SARS-CoV-2/FLU/RSV testing.  Fact Sheet for Patients: BloggerCourse.com  Fact Sheet for Healthcare Providers: SeriousBroker.it  This test is not yet approved or cleared by the [place] FDA and has been authorized for detection and/or diagnosis of SARS-CoV-2 by FDA under an Emergency Use Authorization (EUA). This EUA will remain in effect (meaning this test can be used) for the duration of the COVID-19 declaration under Section 564(b)(1) of the Act, 21 U.S.C. section 360bbb-3(b)(1), unless the authorization is terminated or revoked.     Resp Syncytial Virus by PCR NEGATIVE NEGATIVE Final    Comment: (NOTE) Fact Sheet for Patients: BloggerCourse.com  Fact Sheet for Healthcare Providers: SeriousBroker.it  This test is not yet approved or cleared by the [place] FDA and has been authorized for detection and/or diagnosis of SARS-CoV-2 by FDA under an Emergency Use Authorization (EUA). This EUA will remain in effect (meaning this test can  be used) for the duration of the COVID-19 declaration under Section 564(b)(1) of the Act, 21 U.S.C. section 360bbb-3(b)(1), unless the authorization is terminated or revoked.  Performed at [facility], [address] [place], [place] [address]     Labs: CBC: Recent Labs  Lab 05/19/22 0408 05/21/22 0353 05/23/22 0608  WBC 5.1 4.8 4.8  HGB 14.3 11.8* 12.0*  HCT 41.0 33.7* 34.7*  MCV 91.3 91.8 92.8  PLT 194 156 [id number]   Basic Metabolic Panel: Recent Labs  Lab 05/19/22 0408 05/20/22 0609 05/21/22 0353 05/23/22 0608  NA 133* 134* 136 135  K 4.1 4.8 3.8 3.4*  CL 99 104 106 104  CO2 25 22 24 23   GLUCOSE 81 65* 140* 98  BUN 6*  9 7* <5*  CREATININE 0.89 0.97 0.85 0.99  CALCIUM 8.9 8.4* 8.2* 8.8*   Liver Function Tests: Recent Labs  Lab 05/19/22 0408 05/20/22 0609 05/21/22 0353  AST 53* 31 26  ALT 39 26 21  ALKPHOS 82 78 67  BILITOT 0.9 1.4* 1.0  PROT 7.2 6.5 6.1*  ALBUMIN 3.9 3.4* 3.1*   CBG: Recent Labs  Lab 05/23/22 0758 05/23/22 1149 05/23/22 1731 05/24/22 0527 05/24/22 0803  GLUCAP 100* 106* 115* 88 95    Discharge time spent: greater than 30 minutes.  Signed: Loletha Grayer, MD [facility] 05/24/2022

## 2022-06-26 IMAGING — DX DG CHEST 1V PORT
1 series · 1 of 1 positions shown · non-contrast
Comparison: Chest x-ray 09/24/2020, CT chest 08/17/2016

CLINICAL DATA: Chest pain.  RVR.  Alcohol.

EXAM:
PORTABLE CHEST 1 VIEW

[chest ap]
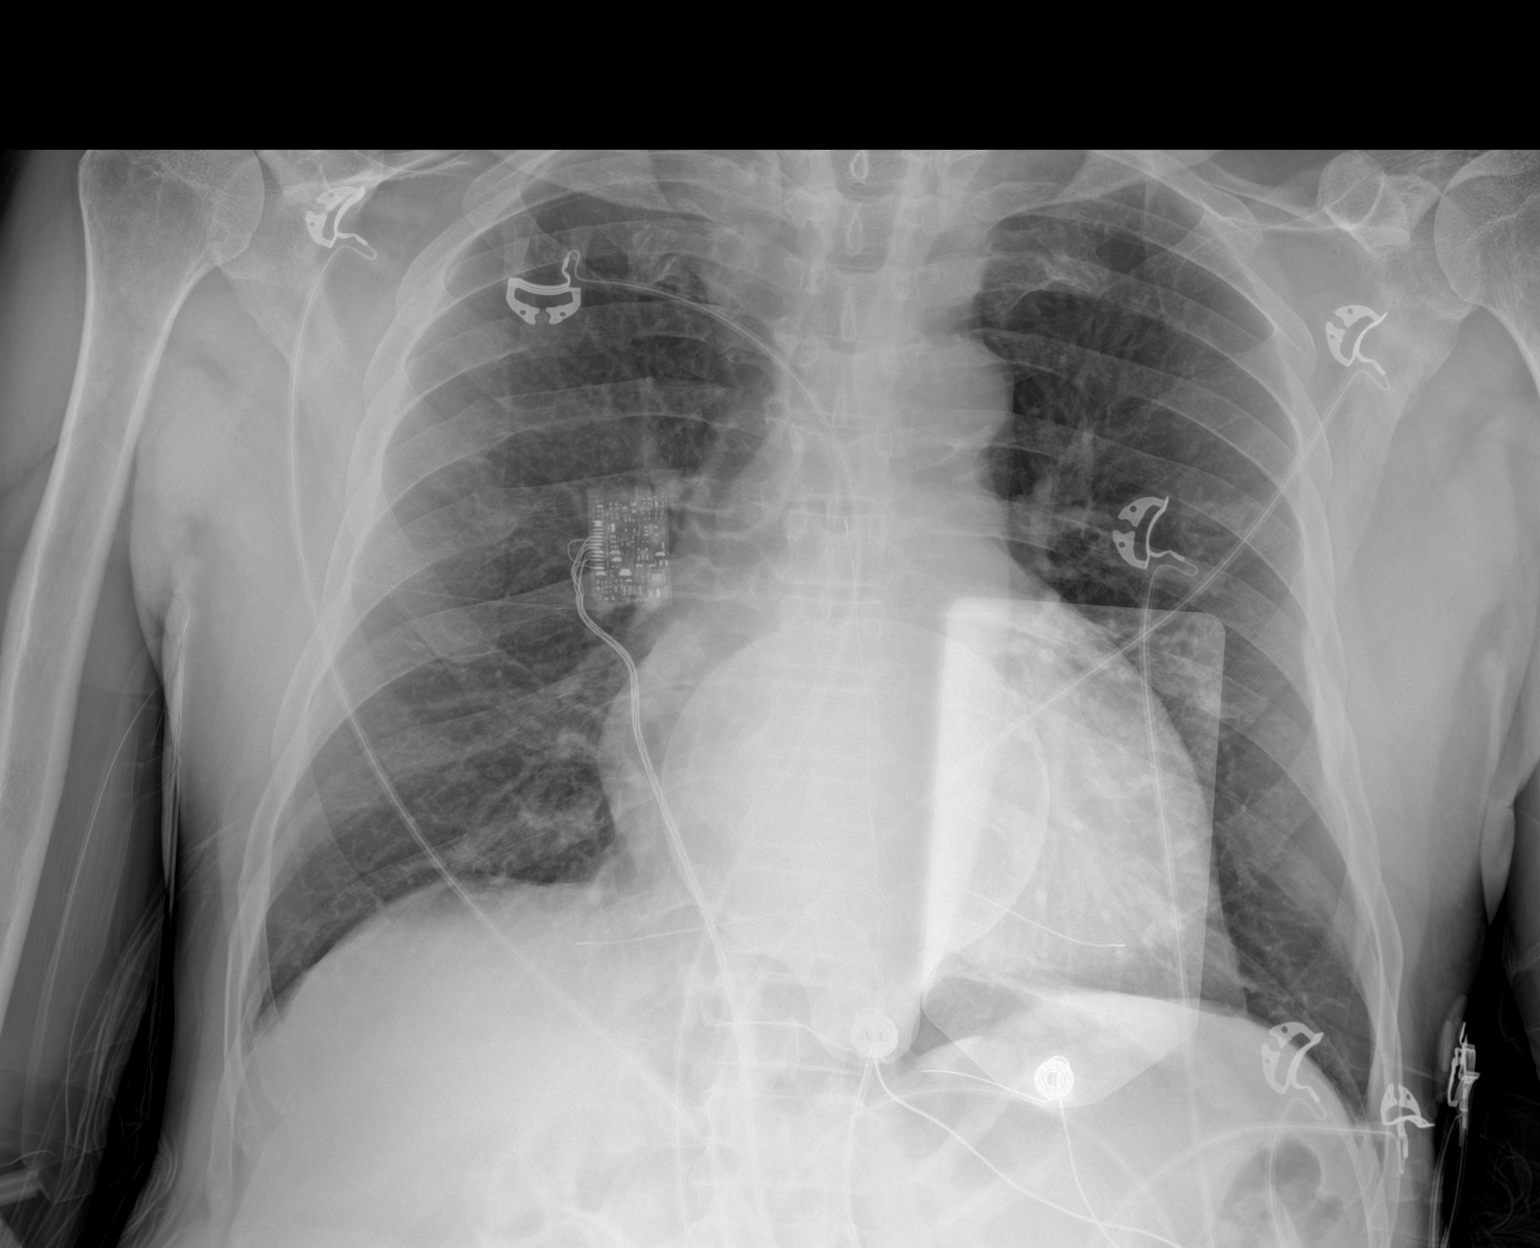

[1 of 1 positions shown; findings below may reference images not displayed]

FINDINGS: Cardiac paddles overlie the patient.

The heart size and mediastinal contours are unchanged. Aortic arch
calcification.

No focal consolidation. No pulmonary edema. No pleural effusion. No
pneumothorax.

No acute osseous abnormality.
IMPRESSION: No active disease.

## 2022-08-06 ENCOUNTER — Emergency Department: Payer: 59

## 2022-08-06 ENCOUNTER — Inpatient Hospital Stay
Admission: EM | Admit: 2022-08-06 | Discharge: 2022-08-10 | DRG: 439 | Disposition: A | Discharge status: Home / Self Care | Payer: 59 | Attending: Internal Medicine | Admitting: Internal Medicine

## 2022-08-06 ENCOUNTER — Other Ambulatory Visit: Payer: Self-pay

## 2022-08-06 DIAGNOSIS — E861 Hypovolemia: Secondary | ICD-10-CM | POA: Diagnosis present

## 2022-08-06 DIAGNOSIS — K852 Alcohol induced acute pancreatitis without necrosis or infection: Secondary | ICD-10-CM | POA: Diagnosis not present

## 2022-08-06 DIAGNOSIS — E669 Obesity, unspecified: Secondary | ICD-10-CM | POA: Diagnosis present

## 2022-08-06 DIAGNOSIS — R079 Chest pain, unspecified: Secondary | ICD-10-CM | POA: Diagnosis present

## 2022-08-06 DIAGNOSIS — Z79899 Other long term (current) drug therapy: Secondary | ICD-10-CM

## 2022-08-06 DIAGNOSIS — I10 Essential (primary) hypertension: Secondary | ICD-10-CM | POA: Diagnosis not present

## 2022-08-06 DIAGNOSIS — I482 Chronic atrial fibrillation, unspecified: Secondary | ICD-10-CM

## 2022-08-06 DIAGNOSIS — Z8249 Family history of ischemic heart disease and other diseases of the circulatory system: Secondary | ICD-10-CM | POA: Diagnosis not present

## 2022-08-06 DIAGNOSIS — K219 Gastro-esophageal reflux disease without esophagitis: Secondary | ICD-10-CM | POA: Diagnosis not present

## 2022-08-06 DIAGNOSIS — Z720 Tobacco use: Secondary | ICD-10-CM | POA: Diagnosis present

## 2022-08-06 DIAGNOSIS — Z716 Tobacco abuse counseling: Secondary | ICD-10-CM

## 2022-08-06 DIAGNOSIS — K5900 Constipation, unspecified: Secondary | ICD-10-CM | POA: Diagnosis present

## 2022-08-06 DIAGNOSIS — E785 Hyperlipidemia, unspecified: Secondary | ICD-10-CM | POA: Diagnosis present

## 2022-08-06 DIAGNOSIS — I4891 Unspecified atrial fibrillation: Secondary | ICD-10-CM | POA: Diagnosis not present

## 2022-08-06 DIAGNOSIS — Z833 Family history of diabetes mellitus: Secondary | ICD-10-CM | POA: Diagnosis not present

## 2022-08-06 DIAGNOSIS — I2510 Atherosclerotic heart disease of native coronary artery without angina pectoris: Secondary | ICD-10-CM | POA: Diagnosis present

## 2022-08-06 DIAGNOSIS — Z6826 Body mass index (BMI) 26.0-26.9, adult: Secondary | ICD-10-CM | POA: Diagnosis not present

## 2022-08-06 DIAGNOSIS — Z7982 Long term (current) use of aspirin: Secondary | ICD-10-CM | POA: Diagnosis not present

## 2022-08-06 DIAGNOSIS — F17210 Nicotine dependence, cigarettes, uncomplicated: Secondary | ICD-10-CM | POA: Diagnosis present

## 2022-08-06 DIAGNOSIS — E875 Hyperkalemia: Secondary | ICD-10-CM

## 2022-08-06 DIAGNOSIS — F1010 Alcohol abuse, uncomplicated: Secondary | ICD-10-CM

## 2022-08-06 DIAGNOSIS — E871 Hypo-osmolality and hyponatremia: Secondary | ICD-10-CM | POA: Diagnosis not present

## 2022-08-06 DIAGNOSIS — R000 Tachycardia, unspecified: Secondary | ICD-10-CM | POA: Diagnosis present

## 2022-08-06 DIAGNOSIS — I739 Peripheral vascular disease, unspecified: Secondary | ICD-10-CM | POA: Diagnosis present

## 2022-08-06 LAB — CBC WITH DIFFERENTIAL/PLATELET
Abs Immature Granulocytes: 0.01 10*3/uL (ref 0.00–0.07)
Basophils Absolute: 0.1 10*3/uL (ref 0.0–0.1)
Basophils Relative: 1 %
Eosinophils Absolute: 0 10*3/uL (ref 0.0–0.5)
Eosinophils Relative: 0 %
HCT: 47.1 % (ref 39.0–52.0)
Hemoglobin: 16.2 g/dL (ref 13.0–17.0)
Immature Granulocytes: 0 %
Lymphocytes Relative: 37 %
Lymphs Abs: 1.8 10*3/uL (ref 0.7–4.0)
MCH: 32.1 pg (ref 26.0–34.0)
MCHC: 34.4 g/dL (ref 30.0–36.0)
MCV: 93.5 fL (ref 80.0–100.0)
Monocytes Absolute: 0.7 10*3/uL (ref 0.1–1.0)
Monocytes Relative: 14 %
Neutro Abs: 2.3 10*3/uL (ref 1.7–7.7)
Neutrophils Relative %: 48 %
Platelets: 294 10*3/uL (ref 150–400)
RBC: 5.04 MIL/uL (ref 4.22–5.81)
RDW: 16.1 % — ABNORMAL HIGH (ref 11.5–15.5)
WBC: 4.8 10*3/uL (ref 4.0–10.5)
nRBC: 0 % (ref 0.0–0.2)

## 2022-08-06 LAB — COMPREHENSIVE METABOLIC PANEL
ALT: 39 U/L (ref 0–44)
AST: 60 U/L — ABNORMAL HIGH (ref 15–41)
Albumin: 3.8 g/dL (ref 3.5–5.0)
Alkaline Phosphatase: 85 U/L (ref 38–126)
Anion gap: 14 (ref 5–15)
BUN: 9 mg/dL (ref 8–23)
CO2: 19 mmol/L — ABNORMAL LOW (ref 22–32)
Calcium: 8.7 mg/dL — ABNORMAL LOW (ref 8.9–10.3)
Chloride: 100 mmol/L (ref 98–111)
Creatinine, Ser: 0.93 mg/dL (ref 0.61–1.24)
GFR, Estimated: >60 mL/min (ref >60)
Glucose, Bld: 63 mg/dL — ABNORMAL LOW (ref 70–99)
Potassium: 5.3 mmol/L — ABNORMAL HIGH (ref 3.5–5.1)
Sodium: 133 mmol/L — ABNORMAL LOW (ref 135–145)
Total Bilirubin: 1.5 mg/dL — ABNORMAL HIGH (ref 0.3–1.2)
Total Protein: 7.3 g/dL (ref 6.5–8.1)

## 2022-08-06 LAB — LIPASE, BLOOD: Lipase: 195 U/L — ABNORMAL HIGH (ref 11–51)

## 2022-08-06 LAB — PROTIME-INR
INR: 1 (ref 0.8–1.2)
Prothrombin Time: 12.8 seconds (ref 11.4–15.2)

## 2022-08-06 MED ORDER — DILTIAZEM HCL 25 MG/5ML IV SOLN
20.0000 mg | Freq: Once | INTRAVENOUS | Status: AC
  Administered 2022-08-06: 20 mg via INTRAVENOUS
  Filled 2022-08-06: qty 5

## 2022-08-06 MED ORDER — ACETAMINOPHEN 650 MG RE SUPP: 650.0000 mg | Freq: Four times a day (QID) | RECTAL | Status: DC | PRN

## 2022-08-06 MED ORDER — SODIUM CHLORIDE 0.9 % IV SOLN: INTRAVENOUS | Status: DC

## 2022-08-06 MED ORDER — KETOROLAC TROMETHAMINE 15 MG/ML IJ SOLN: 15.0000 mg | Freq: Four times a day (QID) | INTRAMUSCULAR | Status: DC | PRN

## 2022-08-06 MED ORDER — MAGNESIUM HYDROXIDE 400 MG/5ML PO SUSP: 30.0000 mL | Freq: Every day | ORAL | Status: DC | PRN

## 2022-08-06 MED ORDER — TRAZODONE HCL 50 MG PO TABS
25.0000 mg | ORAL_TABLET | Freq: Every evening | ORAL | Status: DC | PRN
  Administered 2022-08-07 – 2022-08-08 (×2): 25 mg via ORAL
  Filled 2022-08-06 (×2): qty 1

## 2022-08-06 MED ORDER — ONDANSETRON HCL 4 MG/2ML IJ SOLN
4.0000 mg | Freq: Once | INTRAMUSCULAR | Status: AC
  Administered 2022-08-06: 4 mg via INTRAVENOUS
  Filled 2022-08-06: qty 2

## 2022-08-06 MED ORDER — MORPHINE SULFATE (PF) 2 MG/ML IV SOLN
2.0000 mg | INTRAVENOUS | Status: DC | PRN
  Administered 2022-08-07 – 2022-08-08 (×2): 2 mg via INTRAVENOUS
  Filled 2022-08-06 (×2): qty 1

## 2022-08-06 MED ORDER — MORPHINE SULFATE (PF) 4 MG/ML IV SOLN
4.0000 mg | Freq: Once | INTRAVENOUS | Status: AC
  Administered 2022-08-06: 4 mg via INTRAVENOUS
  Filled 2022-08-06: qty 1

## 2022-08-06 MED ORDER — ONDANSETRON HCL 4 MG/2ML IJ SOLN: 4.0000 mg | Freq: Four times a day (QID) | INTRAMUSCULAR | Status: DC | PRN

## 2022-08-06 MED ORDER — DILTIAZEM HCL-DEXTROSE 125-5 MG/125ML-% IV SOLN (PREMIX)
5.0000 mg/h | INTRAVENOUS | Status: DC
  Administered 2022-08-06: 5 mg/h via INTRAVENOUS
  Administered 2022-08-07: 10 mg/h via INTRAVENOUS
  Filled 2022-08-06 (×2): qty 125

## 2022-08-06 MED ORDER — ACETAMINOPHEN 325 MG PO TABS
650.0000 mg | ORAL_TABLET | Freq: Four times a day (QID) | ORAL | Status: DC | PRN
  Administered 2022-08-09 (×2): 650 mg via ORAL
  Filled 2022-08-06 (×3): qty 2

## 2022-08-06 MED ORDER — SODIUM CHLORIDE 0.9 % IV BOLUS
1000.0000 mL | Freq: Once | INTRAVENOUS | Status: AC
  Administered 2022-08-06: 1000 mL via INTRAVENOUS

## 2022-08-06 MED ORDER — PANTOPRAZOLE SODIUM 40 MG IV SOLR
40.0000 mg | Freq: Once | INTRAVENOUS | Status: AC
  Administered 2022-08-06: 40 mg via INTRAVENOUS
  Filled 2022-08-06: qty 10

## 2022-08-06 MED ORDER — ONDANSETRON HCL 4 MG PO TABS: 4.0000 mg | ORAL_TABLET | Freq: Four times a day (QID) | ORAL | Status: DC | PRN

## 2022-08-06 MED ORDER — IOHEXOL 300 MG/ML  SOLN
100.0000 mL | Freq: Once | INTRAMUSCULAR | Status: AC | PRN
  Administered 2022-08-06: 100 mL via INTRAVENOUS

## 2022-08-06 MED ORDER — ENOXAPARIN SODIUM 40 MG/0.4ML IJ SOSY
40.0000 mg | PREFILLED_SYRINGE | INTRAMUSCULAR | Status: DC
  Filled 2022-08-06 (×3): qty 0.4

## 2022-08-06 NOTE — ED Notes (Signed)
CMP/Lipase hemolyzed per lab- recollect sent.

## 2022-08-06 NOTE — H&P (Signed)
[place]   PATIENT NAME: Darren Rogers    MR#:  [id number]  DATE OF BIRTH:  09/24/1955  DATE OF ADMISSION:  08/06/2022  PRIMARY CARE PHYSICIAN: Pcp, No   Patient is coming from: Home  REQUESTING/REFERRING PHYSICIAN: Brenton Grills, MD  CHIEF COMPLAINT:   Chief Complaint  Patient presents with   Abdominal Pain    HISTORY OF PRESENT ILLNESS:  Darren Rogers is a 67 y.o. African-American male with medical history significant for alcohol abuse, chronic atrial fibrillation, nonobstructive coronary artery disease, PVD, and hypertension, presented to the emergency room with acute onset of upper abdominal pain with associated nausea and dry heaves over the last week.  He has been constipated.  Did not any fever or chills.  His last alcoholic drink was last night.  He usually drinks to 40 ounces of beer daily.  He was noted to be in atrial fibrillation with RVR for which she was given IV Cardizem bolus and drip in the ED. he admitted chest pain feeling like heartburn without significant  palpitations.  No dyspnea or cough or wheezing.  No fever or chills.  No dysuria, oliguria or hematuria or flank pain.  He had a very small bowel movement yesterday.  He was otherwise constipated for the last 10 days.  He stated he was once on Eliquis and stopped it after having nosebleeds.  He is taking aspirin regularly.  ED Course: When he came to the ER, BP was 171/98 with a heart rate of 144 and respiratory to 32 with otherwise normal vital signs.  Labs revealed mild hyponatremia 133 and hyperkalemia 5.3, CO2 of 19 and glucose 63 with calcium of 8.7.  Lipase was 195 and AST 60 with total bili 1.5.  CBC was within normal.   EKG as reviewed by me : Atrial fibrillation with RVR of 154 with T wave inversion inferolaterally and Q waves anteroseptally. Imaging: Portable chest x-ray showed no acute cardiopulmonary disease.  The patient was given 20 mg of IV Cardizem bolus followed by IV Cardizem drip, 4  mg of IV morphine sulfate and 4 mg of IV Zofran and 40 mg of IV Protonix and 1 L bolus of IV normal saline.  He will be admitted to a progressive unit bed for further evaluation and management. PAST MEDICAL HISTORY:   Past Medical History:  Diagnosis Date   Claudication ([place])    a. 06/2015 ABI: R - 0.73, L - 0.73. 30-49% bilat SFA stenosis. 50-74% R Profunda stenosis; b. 01/2017 ABI: R 0.89, L 0.88, TBI R 0.65, L 1.0.   Erectile dysfunction    Essential hypertension    History of echocardiogram    a. 03/29/2014: EF 55-60%, mild LVH, normal RVSP;  b. 05/2015 Echo: EF 60-65%, triv AI, mild MR; c. 03/2021 Echo: EF 55-60%, no rwma, mild LVH, nl RV fxn, mildly dil LA, mild MR.   Hyperlipidemia    Non-obstructive CAD    a. 01/27/2014 Cath: LM nl, LAD mild diff dz w/o obs, LCX no sig obs - scattered 20-30% mLCx, RCA no obs dz, EF 70%; b. 06/2015 MV; EF 60%, no ischemia; c. 07/2020 MV: No ischemia/scar.   Persistent atrial fibrillation ([place])    a. Pt says Dx >1yr ago w/ ? RFCA @ [facility];  b. Recurrent 12/2013;  b. Rx Flecainide and Xarelto-->Intermittent compliance; c. 03/2021 Zio: 100% Afib w/ avg rate of 99 bpm. Rare PVCs.   PVC's (premature ventricular contractions)    a. 05/2017 Zio:  4000 PVCs in 48 hrs (2%). Brief run of SVT.   Tobacco abuse    a. ongoing - 1 ppd.    PAST SURGICAL HISTORY:   Past Surgical History:  Procedure Laterality Date   CARDIAC CATHETERIZATION     CARDIAC CATHETERIZATION     [facility]   LEFT HEART CATH Right 01/27/2014   Procedure: LEFT HEART CATH;  Surgeon: Blane Ohara, MD;  [facility]: [facility] CATH LAB;  Service: Cardiovascular;  Laterality: Right;    SOCIAL HISTORY:   Social History   Tobacco Use   Smoking status: Every Day    Packs/day: 0.50    Years: 30.00    Total pack years: 15.00    Types: Cigarettes   Smokeless tobacco: Never  Substance Use Topics   Alcohol use: Yes    Comment: 1 case per week    FAMILY HISTORY:   Family History  Problem Relation Age  of Onset   Coronary artery disease Father 72   Diabetes Mother    Diabetes Sister    Diabetes Brother    Diabetes Maternal Grandmother    Diabetes Sister    Diabetes Sister     DRUG ALLERGIES:  No Known Allergies  REVIEW OF SYSTEMS:   ROS As per history of present illness. All pertinent systems were reviewed above. Constitutional, HEENT, cardiovascular, respiratory, GI, GU, musculoskeletal, neuro, psychiatric, endocrine, integumentary and hematologic systems were reviewed and are otherwise negative/unremarkable except for positive findings mentioned above in the HPI.   MEDICATIONS AT HOME:   Prior to Admission medications   Medication Sig Start Date End Date Taking? Authorizing Provider  aspirin EC 81 MG tablet Take 1 tablet (81 mg total) by mouth daily. Swallow whole. 05/24/22  Yes Wieting, Richard, MD  diltiazem (CARDIZEM CD) 180 MG 24 hr capsule Take 1 capsule (180 mg total) by mouth daily. 08/26/21  Yes Theora Gianotti, NP  metoprolol tartrate (LOPRESSOR) 100 MG tablet Take 1 tablet (100 mg total) by mouth 2 (two) times daily. 08/26/21  Yes Theora Gianotti, NP  pantoprazole (PROTONIX) 40 MG tablet Take 1 tablet (40 mg total) by mouth daily. 08/26/21  Yes Theora Gianotti, NP  atorvastatin (LIPITOR) 40 MG tablet Take 1 tablet (40 mg total) by mouth daily. Patient not taking: Reported on 08/06/2022 05/24/22   Loletha Grayer, MD  folic acid (FOLVITE) 1 MG tablet Take 1 tablet (1 mg total) by mouth daily. Patient not taking: Reported on 08/06/2022 05/24/22   Loletha Grayer, MD  nicotine (NICODERM CQ - DOSED IN MG/24 HOURS) 21 mg/24hr patch One 21 mg patch chest wall daily (okay to substitute generic) Patient not taking: Reported on 08/06/2022 05/24/22   Loletha Grayer, MD  oxyCODONE (ROXICODONE) 5 MG immediate release tablet Take 1 tablet (5 mg total) by mouth every 8 (eight) hours as needed for severe pain. Patient not taking: Reported on 08/06/2022 05/24/22  05/24/23  Loletha Grayer, MD  polyethylene glycol (MIRALAX / GLYCOLAX) 17 g packet Take 17 g by mouth daily as needed. 05/24/22   Loletha Grayer, MD  thiamine (VITAMIN B-1) 100 MG tablet Take 1 tablet (100 mg total) by mouth daily. Patient not taking: Reported on 08/06/2022 05/24/22   Loletha Grayer, MD      VITAL SIGNS:  Blood pressure (!) 145/91, pulse 96, temperature 97.7 F (36.5 C), temperature source Oral, resp. rate 18, height '5\' 6"'$  (1.676 m), weight 71 kg, SpO2 93 %.  PHYSICAL EXAMINATION:  Physical Exam  GENERAL:  67 y.o.-year-old  African-American male patient lying in the bed with no acute distress.  EYES: Pupils equal, round, reactive to light and accommodation. No scleral icterus. Extraocular muscles intact.  HEENT: Head atraumatic, normocephalic. Oropharynx and nasopharynx clear.  NECK:  Supple, no jugular venous distention. No thyroid enlargement, no tenderness.  LUNGS: Normal breath sounds bilaterally, no wheezing, rales,rhonchi or crepitation. No use of accessory muscles of respiration.  CARDIOVASCULAR: Irregularly irregular tachycardic rhythm, S1, S2 normal. No murmurs, rubs, or gallops.  ABDOMEN: Soft, nondistended, with epigastric and left upper quadrant tenderness without rebound tenderness guarding or rigidity. Bowel sounds present. No organomegaly or mass.  EXTREMITIES: No pedal edema, cyanosis, or clubbing.  NEUROLOGIC: Cranial nerves II through XII are intact. Muscle strength 5/5 in all extremities. Sensation intact. Gait not checked.  PSYCHIATRIC: The patient is alert and oriented x 3.  Normal affect and good eye contact. SKIN: No obvious rash, lesion, or ulcer.   LABORATORY PANEL:   CBC Recent Labs  Lab 08/06/22 1533  WBC 4.8  HGB 16.2  HCT 47.1  PLT 294   ------------------------------------------------------------------------------------------------------------------  Chemistries  Recent Labs  Lab 08/06/22 1845  NA 133*  K 5.3*  CL 100   CO2 19*  GLUCOSE 63*  BUN 9  CREATININE 0.93  CALCIUM 8.7*  AST 60*  ALT 39  ALKPHOS 85  BILITOT 1.5*   ------------------------------------------------------------------------------------------------------------------  Cardiac Enzymes No results for input(s): "TROPONINI" in the last 168 hours. ------------------------------------------------------------------------------------------------------------------  RADIOLOGY:  CT ABDOMEN PELVIS W CONTRAST  Result Date: 08/06/2022 CLINICAL DATA:  Abdominal pain EXAM: CT ABDOMEN AND PELVIS WITH CONTRAST TECHNIQUE: Multidetector CT imaging of the abdomen and pelvis was performed using the standard protocol following bolus administration of intravenous contrast. RADIATION DOSE REDUCTION: This exam was performed according to the departmental dose-optimization program which includes automated exposure control, adjustment of the mA and/or kV according to patient size and/or use of iterative reconstruction technique. CONTRAST:  166m OMNIPAQUE IOHEXOL 300 MG/ML  SOLN COMPARISON:  05/19/2022 FINDINGS: Lower chest: Mild bibasilar atelectasis.  Cardiomegaly. Hepatobiliary: Liver is within normal limits. Gallbladder is unremarkable. No intrahepatic or extrahepatic ductal dilatation. Pancreas: Within normal limits. Spleen: Within normal limits. Adrenals/Urinary Tract: Stomach/Bowel: Adrenal glands are within normal limits. Kidneys are within normal limits.  No hydronephrosis. Thick-walled bladder, although underdistended. Vascular/Lymphatic: Stomach is within normal limits. No evidence of bowel obstruction. Normal appendix (series 2/image 45). No colonic wall thickening or inflammatory changes. Reproductive: Prostate is notable for dystrophic calcifications. Other: No abdominopelvic ascites. Musculoskeletal: Degenerative changes of the lumbar spine. IMPRESSION: Thick-walled bladder, although underdistended. Correlate for cystitis. Otherwise, no CT findings to  account for the patient's abdominal pain. Electronically Signed   By: SJulian HyM.D.   On: 08/06/2022 19:42   DG Chest Portable 1 View  Result Date: 08/06/2022 CLINICAL DATA:  Cough and shortness of breath. EXAM: PORTABLE CHEST 1 VIEW COMPARISON:  Chest radiographs 10/19/2021 and 03/09/2021 FINDINGS: Cardiac silhouette and mediastinal contours are within normal limits. Mild-to-moderate calcification within aortic arch. The lungs are clear. No pleural effusion pneumothorax. IMPRESSION: No active disease. Electronically Signed   By: RYvonne KendallM.D.   On: 08/06/2022 16:48      IMPRESSION AND PLAN:  Assessment and Plan: * Alcoholic pancreatitis - The patient will be admitted to a progressive unit bed. - He will be kept NPO. - We will continue hydration with IV normal saline. - We will follow serial lipase levels. - Pain management will be provided. - He was counseled for alcohol cessation. -  He will be placed on thiamine, folic acid and multivitamins.  Chronic atrial fibrillation with RVR (HCC) - We will continue IV Cardizem drip. - We will continue aspirin with improvement of his nausea and dry heaves. - We will continue his Lopressor. - Given alcohol abuse he is likely not the best candidate for anticoagulation.  Alcohol abuse - Will be placed on alcohol withdrawal precautions. - Will utilize as needed IV Ativan as needed.  Hyperkalemia - We will follow potassium level with hydration.  Hyponatremia - This is likely hypovolemic. - We will continue hydration with half-normal saline and follow BMP.  Essential hypertension - We will continue his antihypertensives.  Tobacco abuse - He was counseled for cessation and will receive further counseling here. - Will place him on NicoDerm CQ patch as needed.  GERD without esophagitis - We will continue PPI therapy.   DVT prophylaxis: Lovenox.  Advanced Care Planning:  Code Status: full code.  Family Communication:  The  plan of care was discussed in details with the patient (and family). I answered all questions. The patient agreed to proceed with the above mentioned plan. Further management will depend upon hospital course. Disposition Plan: Back to previous home environment Consults called: none.  All the records are reviewed and case discussed with ED provider.  Status is: Inpatient  At the time of the admission, it appears that the appropriate admission status for this patient is inpatient.  This is judged to be reasonable and necessary in order to provide the required intensity of service to ensure the patient's safety given the presenting symptoms, physical exam findings and initial radiographic and laboratory data in the context of comorbid conditions.  The patient requires inpatient status due to high intensity of service, high risk of further deterioration and high frequency of surveillance required.  I certify that at the time of admission, it is my clinical judgment that the patient will require inpatient hospital care extending more than 2 midnights.                            Dispo: The patient is from: Home              Anticipated d/c is to: Home              Patient currently is not medically stable to d/c.              Difficult to place patient: No  Christel Mormon M.D on 08/06/2022 at 10:20 PM  [facility]   From 7 PM-7 AM, contact night-coverage [email or web address]  CC: Primary care physician; Pcp, No

## 2022-08-06 NOTE — Assessment & Plan Note (Signed)
-   We will continue PPI therapy. 

## 2022-08-06 NOTE — Assessment & Plan Note (Addendum)
-   The patient will be admitted to a progressive unit bed. - He will be kept NPO. - We will continue hydration with IV normal saline. - We will follow serial lipase levels. - Pain management will be provided. - He was counseled for alcohol cessation. - He will be placed on thiamine, folic acid and multivitamins.

## 2022-08-06 NOTE — Assessment & Plan Note (Signed)
-   We will follow potassium level with hydration.

## 2022-08-06 NOTE — Assessment & Plan Note (Signed)
-   He was counseled for cessation and will receive further counseling here. - Will place him on NicoDerm CQ patch as needed.

## 2022-08-06 NOTE — ED Notes (Signed)
Pt refused Lovenox shot stating " I don't want to be stuck anymore"/ this nurse explained that he would benefit from another IV d/t having a continuous medication. Pt finally agreed/ this nurse stuck pt in left wrist, pt screamed and jerked his hand back saying " take it out now", this nurse removed IV. Pt refused to let this nurse look for Iv on opposite arm.  Pt became irate after admitting MD told pt he could not eat tonight.

## 2022-08-06 NOTE — Assessment & Plan Note (Signed)
-   We will continue his antihypertensives. 

## 2022-08-06 NOTE — ED Provider Notes (Signed)
[facility] Provider Note    Event Date/Time   First MD Initiated Contact with Patient 08/06/22 1529     (approximate)   History   Chief Complaint: Abdominal Pain   HPI  Darren Rogers is a 67 y.o. male with a history of atrial fibrillation, GERD, alcoholic pancreatitis, hypertension who comes ED complaining of worsening generalized abdominal pain which is nonradiating, worse laying down, associated with lack of bowel movement for the last 10 days, not passing gas, and dry heaves.  Loss of appetite.  Has not had anything to eat since yesterday.  Patient also reports that over the last few days he is having dizziness with standing.  Normally drinks 240 ounce beers a day.  Last drink last night.  He does not have a history of hallucinosis or seizures with withdrawal but he does get the shakes.     Physical Exam   Triage Vital Signs: ED Triage Vitals  Enc Vitals Group     BP 08/06/22 1530 (!) 171/98     Pulse Rate 08/06/22 1530 (!) 144     Resp 08/06/22 1530 (!) 32     Temp 08/06/22 1533 97.8 F (36.6 C)     Temp Source 08/06/22 1533 Oral     SpO2 08/06/22 1527 100 %     Weight 08/06/22 1530 156 lb 8.4 oz (71 kg)     Height 08/06/22 1530 '5\' 6"'$  (1.676 m)     Head Circumference --      Peak Flow --      Pain Score 08/06/22 1530 8     Pain Loc --      Pain Edu? --      Excl. in [place]? --     Most recent vital signs: Vitals:   08/06/22 1900 08/06/22 1946  BP: (!) 159/95 (!) 145/80  Pulse: 77 (!) 124  Resp: 16 (!) 26  Temp:  97.7 F (36.5 C)  SpO2: 99% 100%    General: Awake, no distress.  CV:  Good peripheral perfusion.  Irregular rhythm, tachycardia heart rate 170 Resp:  Normal effort.  Clear to auscultation bilaterally Abd:  No distention.  Diffuse tenderness.  Tympany to percussion. Other:  Dry mucous membranes.  No lower extremity edema.   ED Results / Procedures / Treatments   Labs (all labs ordered are listed, but only abnormal  results are displayed) Labs Reviewed  CBC WITH DIFFERENTIAL/PLATELET - Abnormal; Notable for the following components:      Result Value   RDW 16.1 (*)    All other components within normal limits  COMPREHENSIVE METABOLIC PANEL - Abnormal; Notable for the following components:   Sodium 133 (*)    Potassium 5.3 (*)    CO2 19 (*)    Glucose, Bld 63 (*)    Calcium 8.7 (*)    AST 60 (*)    Total Bilirubin 1.5 (*)    All other components within normal limits  LIPASE, BLOOD - Abnormal; Notable for the following components:   Lipase 195 (*)    All other components within normal limits     EKG Interpreted by me Atrial fibrillation, rate of 154.  Normal axis, normal intervals.  Normal QRS ST segments.  Some lateral T wave inversions likely rate related demand ischemia.   RADIOLOGY Chest x-ray interpreted by me, appears normal.  Radiology report reviewed   PROCEDURES:  .Critical Care  Performed by: Carrie Mew, MD Authorized by: Carrie Mew, MD  Critical care provider statement:    Critical care time (minutes):  35   Critical care time was exclusive of:  Separately billable procedures and treating other patients   Critical care was necessary to treat or prevent imminent or life-threatening deterioration of the following conditions:  Cardiac failure and circulatory failure   Critical care was time spent personally by me on the following activities:  Development of treatment plan with patient or surrogate, discussions with consultants, evaluation of patient's response to treatment, examination of patient, obtaining history from patient or surrogate, ordering and performing treatments and interventions, ordering and review of laboratory studies, ordering and review of radiographic studies, pulse oximetry, re-evaluation of patient's condition and review of old charts   Care discussed with: admitting provider   Comments:        .1-3 Lead EKG Interpretation  Performed by:  Carrie Mew, MD Authorized by: Carrie Mew, MD     Interpretation: abnormal     ECG rate:  135   ECG rate assessment: tachycardic     Rhythm: atrial fibrillation     Ectopy: none     Conduction: normal      MEDICATIONS ORDERED IN ED: Medications  diltiazem (CARDIZEM) 125 mg in dextrose 5% 125 mL (1 mg/mL) infusion (5 mg/hr Intravenous New Bag/Given 08/06/22 2111)  sodium chloride 0.9 % bolus 1,000 mL (0 mLs Intravenous Stopped 08/06/22 1800)  ondansetron (ZOFRAN) injection 4 mg (4 mg Intravenous Given 08/06/22 1643)  morphine (PF) 4 MG/ML injection 4 mg (4 mg Intravenous Given 08/06/22 1643)  pantoprazole (PROTONIX) injection 40 mg (40 mg Intravenous Given 08/06/22 1644)  iohexol (OMNIPAQUE) 300 MG/ML solution 100 mL (100 mLs Intravenous Contrast Given 08/06/22 1921)  diltiazem (CARDIZEM) injection 20 mg (20 mg Intravenous Given 08/06/22 1938)     IMPRESSION / MDM / [place] / ED COURSE  I reviewed the triage vital signs and the nursing notes.  DDx: Pancreatitis, bowel obstruction, diverticulitis, bowel perforation, GERD/gastritis, peptic ulcer disease, dehydration, electrolyte abnormality, anemia  Patient's presentation is most consistent with acute presentation with potential threat to life or bodily function.  Patient presents with constipation, obstipation, abdominal pain and distention.  Will give IV fluids, along with IV Zofran and morphine for symptom relief.  Will obtain labs and CT scan of the abdomen and pelvis to evaluate.   ----------------------------------------- 9:11 PM on 08/06/2022 ----------------------------------------- CT negative for bowel obstruction or other acute findings.  Labs reveal elevated lipase consistent with acute alcoholic pancreatitis.  After Dilt bolus, still has heart rate of about 130.  Diltiazem continuous infusion started.  Case discussed with hospitalist.      FINAL CLINICAL IMPRESSION(S) / ED DIAGNOSES   Final diagnoses:   Alcohol-induced acute pancreatitis without infection or necrosis  Atrial fibrillation with RVR ([place])     Rx / DC Orders   ED Discharge Orders     None        Note:  This document was prepared using Dragon voice recognition software and may include unintentional dictation errors.   Carrie Mew, MD 08/06/22 2111

## 2022-08-06 NOTE — ED Notes (Signed)
Patient hit call button stating he had chest pain.  Upon entering room, patient heart rate was 140-160.  New EKG was performed, and delivered to Dr. Joni Fears.

## 2022-08-06 NOTE — Assessment & Plan Note (Signed)
-   This is likely hypovolemic. - We will continue hydration with half-normal saline and follow BMP.

## 2022-08-06 NOTE — Assessment & Plan Note (Addendum)
-   We will continue IV Cardizem drip. - We will continue aspirin with improvement of his nausea and dry heaves. - We will continue his Lopressor. - Given alcohol abuse he is likely not the best candidate for anticoagulation.

## 2022-08-06 NOTE — Assessment & Plan Note (Signed)
-   Will be placed on alcohol withdrawal precautions. - Will utilize as needed IV Ativan as needed.

## 2022-08-06 NOTE — ED Notes (Signed)
Lab at bedside

## 2022-08-06 NOTE — ED Triage Notes (Signed)
Pt arrives by [facility] from home for abdominal/chest pain and weakness x2 days.  Pt states he has a history of pancreatitis and afib. EMS EKG shows A-Fib with a rate ranging from 120-190.  Pt has not taken metoprolol for the past 2 days. 324 asa given en route. Abdomen tender and distended.  Pt ambulatory, a&ox4 on RA.

## 2022-08-06 NOTE — ED Notes (Signed)
Patient states that chest pain has subsided after diltiazem administration.

## 2022-08-07 ENCOUNTER — Encounter: Payer: Self-pay | Admitting: Family Medicine

## 2022-08-07 DIAGNOSIS — I10 Essential (primary) hypertension: Secondary | ICD-10-CM | POA: Diagnosis not present

## 2022-08-07 DIAGNOSIS — K852 Alcohol induced acute pancreatitis without necrosis or infection: Secondary | ICD-10-CM

## 2022-08-07 DIAGNOSIS — E871 Hypo-osmolality and hyponatremia: Secondary | ICD-10-CM | POA: Diagnosis not present

## 2022-08-07 DIAGNOSIS — I4891 Unspecified atrial fibrillation: Secondary | ICD-10-CM | POA: Diagnosis not present

## 2022-08-07 LAB — COMPREHENSIVE METABOLIC PANEL
ALT: 35 U/L (ref 0–44)
AST: 47 U/L — ABNORMAL HIGH (ref 15–41)
Albumin: 3.5 g/dL (ref 3.5–5.0)
Alkaline Phosphatase: 81 U/L (ref 38–126)
Anion gap: 12 (ref 5–15)
BUN: 12 mg/dL (ref 8–23)
CO2: 20 mmol/L — ABNORMAL LOW (ref 22–32)
Calcium: 8.9 mg/dL (ref 8.9–10.3)
Chloride: 102 mmol/L (ref 98–111)
Creatinine, Ser: 1 mg/dL (ref 0.61–1.24)
GFR, Estimated: >60 mL/min (ref >60)
Glucose, Bld: 73 mg/dL (ref 70–99)
Potassium: 4.2 mmol/L (ref 3.5–5.1)
Sodium: 134 mmol/L — ABNORMAL LOW (ref 135–145)
Total Bilirubin: 1.3 mg/dL — ABNORMAL HIGH (ref 0.3–1.2)
Total Protein: 6.8 g/dL (ref 6.5–8.1)

## 2022-08-07 LAB — CBC
HCT: 40.2 % (ref 39.0–52.0)
Hemoglobin: 13.8 g/dL (ref 13.0–17.0)
MCH: 32.2 pg (ref 26.0–34.0)
MCHC: 34.3 g/dL (ref 30.0–36.0)
MCV: 93.7 fL (ref 80.0–100.0)
Platelets: 226 10*3/uL (ref 150–400)
RBC: 4.29 MIL/uL (ref 4.22–5.81)
RDW: 16 % — ABNORMAL HIGH (ref 11.5–15.5)
WBC: 4.3 10*3/uL (ref 4.0–10.5)
nRBC: 0 % (ref 0.0–0.2)

## 2022-08-07 LAB — LIPASE, BLOOD: Lipase: 179 U/L — ABNORMAL HIGH (ref 11–51)

## 2022-08-07 LAB — APTT: aPTT: 27 seconds (ref 24–36)

## 2022-08-07 MED ORDER — ALUM & MAG HYDROXIDE-SIMETH 200-200-20 MG/5ML PO SUSP
30.0000 mL | Freq: Four times a day (QID) | ORAL | Status: DC | PRN
  Administered 2022-08-07: 30 mL via ORAL
  Filled 2022-08-07: qty 30

## 2022-08-07 MED ORDER — METOPROLOL TARTRATE 50 MG PO TABS
100.0000 mg | ORAL_TABLET | Freq: Two times a day (BID) | ORAL | Status: DC
  Administered 2022-08-07 – 2022-08-10 (×7): 100 mg via ORAL
  Filled 2022-08-07 (×7): qty 2

## 2022-08-07 MED ORDER — ADULT MULTIVITAMIN W/MINERALS CH
1.0000 | ORAL_TABLET | Freq: Every day | ORAL | Status: DC
  Administered 2022-08-07 – 2022-08-10 (×4): 1 via ORAL
  Filled 2022-08-07 (×4): qty 1

## 2022-08-07 MED ORDER — PANTOPRAZOLE SODIUM 40 MG PO TBEC
40.0000 mg | DELAYED_RELEASE_TABLET | Freq: Every day | ORAL | Status: DC
  Administered 2022-08-07 – 2022-08-08 (×2): 40 mg via ORAL
  Filled 2022-08-07 (×2): qty 1

## 2022-08-07 MED ORDER — LORAZEPAM 1 MG PO TABS
1.0000 mg | ORAL_TABLET | ORAL | Status: DC | PRN
  Administered 2022-08-08: 1 mg via ORAL
  Filled 2022-08-07: qty 1

## 2022-08-07 MED ORDER — FOLIC ACID 1 MG PO TABS
1.0000 mg | ORAL_TABLET | Freq: Every day | ORAL | Status: DC
  Administered 2022-08-07 – 2022-08-10 (×4): 1 mg via ORAL
  Filled 2022-08-07 (×4): qty 1

## 2022-08-07 MED ORDER — THIAMINE MONONITRATE 100 MG PO TABS
100.0000 mg | ORAL_TABLET | Freq: Every day | ORAL | Status: DC
  Administered 2022-08-07 – 2022-08-10 (×4): 100 mg via ORAL
  Filled 2022-08-07 (×4): qty 1

## 2022-08-07 MED ORDER — DILTIAZEM HCL ER COATED BEADS 180 MG PO CP24
180.0000 mg | ORAL_CAPSULE | Freq: Every day | ORAL | Status: DC
  Administered 2022-08-07 – 2022-08-10 (×4): 180 mg via ORAL
  Filled 2022-08-07 (×4): qty 1

## 2022-08-07 MED ORDER — LORAZEPAM 1 MG PO TABS
1.0000 mg | ORAL_TABLET | ORAL | Status: DC | PRN
  Administered 2022-08-08 – 2022-08-09 (×2): 1 mg via ORAL
  Filled 2022-08-07 (×2): qty 1

## 2022-08-07 MED ORDER — THIAMINE HCL 100 MG/ML IJ SOLN: 100.0000 mg | Freq: Every day | INTRAMUSCULAR | Status: DC

## 2022-08-07 NOTE — Progress Notes (Signed)
[facility] [place] NAME: Darren Rogers    MR#:  [id number]  DATE OF BIRTH:  1956/01/15  SUBJECTIVE:  patient seen earlier in the ER. No family at bedside. Complains of bloated abdomen and discomfort after she tried some clear liquid diet. No vomiting. Tells me he has been drinking beer on regular basis. Denies any shortness of breath. Patient has been tachycardic with rapid a fib. He is currently on IV diltiazem drip.    VITALS:  Blood pressure (!) 126/115, pulse (!) 47, temperature 97.7 F (36.5 C), temperature source Oral, resp. rate 15, height '5\' 6"'$  (1.676 m), weight 71 kg, SpO2 100 %.  PHYSICAL EXAMINATION:   GENERAL:  67 y.o.-year-old patient with no acute distress. obese LUNGS: Normal breath sounds bilaterally, no wheezing CARDIOVASCULAR: S1, S2 normal. No murmur  irregular rhythm  ABDOMEN: Soft, epigastric tenderness, nondistended. EXTREMITIES: No  edema b/l.    NEUROLOGIC: nonfocal  patient is alert and awake SKIN: No obvious rash, lesion, or ulcer.   LABORATORY PANEL:  CBC Recent Labs  Lab 08/07/22 0436  WBC 4.3  HGB 13.8  HCT 40.2  PLT 226    Chemistries  Recent Labs  Lab 08/07/22 0436  NA 134*  K 4.2  CL 102  CO2 20*  GLUCOSE 73  BUN 12  CREATININE 1.00  CALCIUM 8.9  AST 47*  ALT 35  ALKPHOS 81  BILITOT 1.3*   Cardiac Enzymes No results for input(s): "TROPONINI" in the last 168 hours. RADIOLOGY:  CT ABDOMEN PELVIS W CONTRAST  Result Date: 08/06/2022 CLINICAL DATA:  Abdominal pain EXAM: CT ABDOMEN AND PELVIS WITH CONTRAST TECHNIQUE: Multidetector CT imaging of the abdomen and pelvis was performed using the standard protocol following bolus administration of intravenous contrast. RADIATION DOSE REDUCTION: This exam was performed according to the departmental dose-optimization program which includes automated exposure control, adjustment of the mA and/or kV according to patient size and/or use of iterative  reconstruction technique. CONTRAST:  140m OMNIPAQUE IOHEXOL 300 MG/ML  SOLN COMPARISON:  05/19/2022 FINDINGS: Lower chest: Mild bibasilar atelectasis.  Cardiomegaly. Hepatobiliary: Liver is within normal limits. Gallbladder is unremarkable. No intrahepatic or extrahepatic ductal dilatation. Pancreas: Within normal limits. Spleen: Within normal limits. Adrenals/Urinary Tract: Stomach/Bowel: Adrenal glands are within normal limits. Kidneys are within normal limits.  No hydronephrosis. Thick-walled bladder, although underdistended. Vascular/Lymphatic: Stomach is within normal limits. No evidence of bowel obstruction. Normal appendix (series 2/image 45). No colonic wall thickening or inflammatory changes. Reproductive: Prostate is notable for dystrophic calcifications. Other: No abdominopelvic ascites. Musculoskeletal: Degenerative changes of the lumbar spine. IMPRESSION: Thick-walled bladder, although underdistended. Correlate for cystitis. Otherwise, no CT findings to account for the patient's abdominal pain. Electronically Signed   By: SJulian HyM.D.   On: 08/06/2022 19:42   DG Chest Portable 1 View  Result Date: 08/06/2022 CLINICAL DATA:  Cough and shortness of breath. EXAM: PORTABLE CHEST 1 VIEW COMPARISON:  Chest radiographs 10/19/2021 and 03/09/2021 FINDINGS: Cardiac silhouette and mediastinal contours are within normal limits. Mild-to-moderate calcification within aortic arch. The lungs are clear. No pleural effusion pneumothorax. IMPRESSION: No active disease. Electronically Signed   By: RYvonne KendallM.D.   On: 08/06/2022 16:48    Assessment and Plan JLevent Olveris a 67y.o. African-American male with medical history significant for alcohol abuse, chronic atrial fibrillation, nonobstructive coronary artery disease, PVD, and hypertension, presented to the emergency room with acute onset of upper abdominal pain with associated nausea and  dry heaves over the last week.  He has been constipated.   Did not any fever or chills.  His last alcoholic drink was last night.  He usually drinks to 40 ounces of beer daily.  He was noted to be in atrial fibrillation with RVR for which she was given IV Cardizem bolus and drip in the ED.     Alcoholic pancreatitis - now on CLD -  continue hydration with IV normal saline. - Lipase 195--179 - Pain meds prn. -  counseled for alcohol cessation. - on thiamine, folic acid and multivitamins.   Chronic atrial fibrillation with RVR ([facility]) - continue IV Cardizem drip. - on ASA -  continue his Lopressor + CCB and try wean off the CCB gtt --follows with [facility] if needed--follows with Dr Fletcher Anon - Given alcohol abuse he is likely not the best candidate for anticoagulation. --Echo 2022--EF 50-55%   Alcohol abuse - placed on alcohol withdrawal precautions. -as needed IV Ativan as needed.   Hyperkalemia - potassium level with hydration--K 4.2   Hyponatremia - This is likely hypovolemic. -improving   Essential hypertension - will continue his antihypertensives.   Tobacco abuse - He was counseled for cessation  - Will place him on NicoDerm CQ patch as needed.   GERD without esophagitis -  continue PPI therapy.     DVT prophylaxis: Lovenox.  Advanced Care Planning:  Code Status: full code.  Family Communication: none today Consults :none Level of care: Progressive Status is: Inpatient Remains inpatient appropriate because: Management of Pancreatitis    TOTAL TIME TAKING CARE OF THIS PATIENT: 35 minutes.  >50% time spent on counselling and coordination of care  Note: This dictation was prepared with Dragon dictation along with smaller phrase technology. Any transcriptional errors that result from this process are unintentional.  Fritzi Mandes M.D    [facility]   CC: Primary care physician; Pcp, No

## 2022-08-07 NOTE — Plan of Care (Signed)

## 2022-08-08 DIAGNOSIS — I4891 Unspecified atrial fibrillation: Secondary | ICD-10-CM | POA: Diagnosis not present

## 2022-08-08 DIAGNOSIS — Z720 Tobacco use: Secondary | ICD-10-CM

## 2022-08-08 DIAGNOSIS — K219 Gastro-esophageal reflux disease without esophagitis: Secondary | ICD-10-CM

## 2022-08-08 DIAGNOSIS — K852 Alcohol induced acute pancreatitis without necrosis or infection: Secondary | ICD-10-CM | POA: Diagnosis not present

## 2022-08-08 LAB — LIPASE, BLOOD: Lipase: 153 U/L — ABNORMAL HIGH (ref 11–51)

## 2022-08-08 MED ORDER — METOPROLOL TARTRATE 5 MG/5ML IV SOLN: 5.0000 mg | Freq: Four times a day (QID) | INTRAVENOUS | Status: DC | PRN

## 2022-08-08 MED ORDER — OXYCODONE HCL 5 MG PO TABS
5.0000 mg | ORAL_TABLET | Freq: Three times a day (TID) | ORAL | Status: DC | PRN
  Administered 2022-08-08 (×2): 5 mg via ORAL
  Filled 2022-08-08 (×2): qty 1

## 2022-08-08 MED ORDER — MORPHINE SULFATE (PF) 2 MG/ML IV SOLN
2.0000 mg | INTRAVENOUS | Status: DC | PRN
  Administered 2022-08-08: 2 mg via INTRAVENOUS
  Filled 2022-08-08: qty 1

## 2022-08-08 MED ORDER — PANTOPRAZOLE SODIUM 40 MG PO TBEC
40.0000 mg | DELAYED_RELEASE_TABLET | Freq: Two times a day (BID) | ORAL | Status: DC
  Administered 2022-08-08 – 2022-08-10 (×4): 40 mg via ORAL
  Filled 2022-08-08 (×4): qty 1

## 2022-08-08 MED ORDER — METOPROLOL TARTRATE 5 MG/5ML IV SOLN: 5.0000 mg | Freq: Four times a day (QID) | INTRAVENOUS | Status: DC

## 2022-08-08 NOTE — Progress Notes (Signed)
[facility] NAME: Darren Rogers    MR#:  [id number]  DATE OF BIRTH:  03-03-1956  SUBJECTIVE:  patient seen earlier in the ER. No family at bedside. Complains of bloated abdomen and discomfort after she tried some clear liquid diet. No vomiting. Tells me he has been drinking beer on regular basis. Denies any shortness of breath. Patient had been tachycardic with rapid a fib. He is currently off IV diltiazem drip. Epigastric pain with po intake with bloating   VITALS:  Blood pressure (!) 135/92, pulse 79, temperature 97.6 F (36.4 C), temperature source Oral, resp. rate 16, height '5\' 6"'$  (1.676 m), weight 73.9 kg, SpO2 96 %.  PHYSICAL EXAMINATION:   GENERAL:  67 y.o.-year-old patient with no acute distress. obese LUNGS: Normal breath sounds bilaterally, no wheezing CARDIOVASCULAR: S1, S2 normal. No murmur  irregular rhythm  ABDOMEN: Soft, epigastric tenderness, nondistended. EXTREMITIES: No  edema b/l.    NEUROLOGIC: nonfocal  patient is alert and awake SKIN: No obvious rash, lesion, or ulcer.   LABORATORY PANEL:  CBC Recent Labs  Lab 08/07/22 0436  WBC 4.3  HGB 13.8  HCT 40.2  PLT 226     Chemistries  Recent Labs  Lab 08/07/22 0436  NA 134*  K 4.2  CL 102  CO2 20*  GLUCOSE 73  BUN 12  CREATININE 1.00  CALCIUM 8.9  AST 47*  ALT 35  ALKPHOS 81  BILITOT 1.3*    Cardiac Enzymes No results for input(s): "TROPONINI" in the last 168 hours. RADIOLOGY:  CT ABDOMEN PELVIS W CONTRAST  Result Date: 08/06/2022 CLINICAL DATA:  Abdominal pain EXAM: CT ABDOMEN AND PELVIS WITH CONTRAST TECHNIQUE: Multidetector CT imaging of the abdomen and pelvis was performed using the standard protocol following bolus administration of intravenous contrast. RADIATION DOSE REDUCTION: This exam was performed according to the departmental dose-optimization program which includes automated exposure control, adjustment of the mA and/or kV  according to patient size and/or use of iterative reconstruction technique. CONTRAST:  184m OMNIPAQUE IOHEXOL 300 MG/ML  SOLN COMPARISON:  05/19/2022 FINDINGS: Lower chest: Mild bibasilar atelectasis.  Cardiomegaly. Hepatobiliary: Liver is within normal limits. Gallbladder is unremarkable. No intrahepatic or extrahepatic ductal dilatation. Pancreas: Within normal limits. Spleen: Within normal limits. Adrenals/Urinary Tract: Stomach/Bowel: Adrenal glands are within normal limits. Kidneys are within normal limits.  No hydronephrosis. Thick-walled bladder, although underdistended. Vascular/Lymphatic: Stomach is within normal limits. No evidence of bowel obstruction. Normal appendix (series 2/image 45). No colonic wall thickening or inflammatory changes. Reproductive: Prostate is notable for dystrophic calcifications. Other: No abdominopelvic ascites. Musculoskeletal: Degenerative changes of the lumbar spine. IMPRESSION: Thick-walled bladder, although underdistended. Correlate for cystitis. Otherwise, no CT findings to account for the patient's abdominal pain. Electronically Signed   By: SJulian HyM.D.   On: 08/06/2022 19:42   DG Chest Portable 1 View  Result Date: 08/06/2022 CLINICAL DATA:  Cough and shortness of breath. EXAM: PORTABLE CHEST 1 VIEW COMPARISON:  Chest radiographs 10/19/2021 and 03/09/2021 FINDINGS: Cardiac silhouette and mediastinal contours are within normal limits. Mild-to-moderate calcification within aortic arch. The lungs are clear. No pleural effusion pneumothorax. IMPRESSION: No active disease. Electronically Signed   By: RYvonne KendallM.D.   On: 08/06/2022 16:48    Assessment and Plan Darren Skenandoreis a 67y.o. African-American male with medical history significant for alcohol abuse, chronic atrial fibrillation, nonobstructive coronary artery disease, PVD, and hypertension, presented to the emergency room with acute onset of  upper abdominal pain with associated nausea and dry  heaves over the last week.  He has been constipated.  Did not any fever or chills.  His last alcoholic drink was last night.  He usually drinks to 40 ounces of beer daily.  He was noted to be in atrial fibrillation with RVR for which she was given IV Cardizem bolus and drip in the ED.     Alcoholic pancreatitis - now on CLD -  continue hydration with IV normal saline. - Lipase 195--179--153 - Pain meds prn. -  counseled for alcohol cessation. - on thiamine, folic acid and multivitamins. --PPI bid --if cont with epigastric pain will consider GI eval   Chronic atrial fibrillation with RVR ([facility]) - continue IV Cardizem drip. - on ASA -  continue his Lopressor + CCB and try wean off the CCB gtt --follows with [facility] if needed--follows with Dr Fletcher Anon - Given alcohol abuse he is likely not the best candidate for anticoagulation. --Echo 2022--EF 50-55%   Alcohol abuse - placed on alcohol withdrawal precautions. -as needed IV Ativan as needed.   Hyperkalemia - potassium level with hydration--K 4.2   Hyponatremia - This is likely hypovolemic. -improving   Essential hypertension -  continue his antihypertensives.   Tobacco abuse - He was counseled for cessation  - place him on NicoDerm CQ patch as needed.   GERD without esophagitis -  continue PPI therapy.     DVT prophylaxis: Lovenox.  Advanced Care Planning:  Code Status: full code.  Family Communication: none today Consults :none Level of care: Progressive Status is: Inpatient Remains inpatient appropriate because: Management of Pancreatitis    TOTAL TIME TAKING CARE OF THIS PATIENT: 35 minutes.  >50% time spent on counselling and coordination of care  Note: This dictation was prepared with Dragon dictation along with smaller phrase technology. Any transcriptional errors that result from this process are unintentional.  Fritzi Mandes M.D    [facility]   CC: Primary care physician; Pcp, No

## 2022-08-09 DIAGNOSIS — K852 Alcohol induced acute pancreatitis without necrosis or infection: Secondary | ICD-10-CM | POA: Diagnosis not present

## 2022-08-09 DIAGNOSIS — I4891 Unspecified atrial fibrillation: Secondary | ICD-10-CM | POA: Diagnosis not present

## 2022-08-09 DIAGNOSIS — Z720 Tobacco use: Secondary | ICD-10-CM | POA: Diagnosis not present

## 2022-08-09 DIAGNOSIS — I10 Essential (primary) hypertension: Secondary | ICD-10-CM | POA: Diagnosis not present

## 2022-08-09 MED ORDER — ADULT MULTIVITAMIN W/MINERALS CH: 1.0000 | ORAL_TABLET | Freq: Every day | ORAL | 1 refills | Status: DC

## 2022-08-09 NOTE — Progress Notes (Signed)
Pt reports dizziness while getting up. He also mentioned "blacking out," denied fall. MD notified. Cancelled discharge.    08/09/22 1411  Vitals  BP (!) 147/96  MAP (mmHg) 112  BP Location Right Arm  BP Method Automatic  Patient Position (if appropriate) Lying  Pulse Rate 89  Pulse Rate Source Monitor  ECG Heart Rate 99  Resp 16  MEWS COLOR  MEWS Score Color Green  Oxygen Therapy  SpO2 97 %  O2 Device Room Air  Pain Assessment  Pain Scale 0-10  Pain Score 0

## 2022-08-09 NOTE — Discharge Summary (Addendum)
Physician Discharge Summary   Patient: Darren Rogers MRN: [id number] DOB: [birth year]  Admit date:     08/06/2022  Discharge date: 08/09/22  Discharge Physician: Darren Rogers   PCP: Darren Guise, MD   Recommendations at discharge:   follow-up cardiology Dr. Fletcher Rogers in 1 to 2 weeks 2. follow-up PCP in 1 to 2 week  3. abstain from drinking alcohol  Discharge Diagnoses: Principal Problem:   Alcoholic pancreatitis Active Problems:   Chronic atrial fibrillation with RVR ([place])   Alcohol abuse   Essential hypertension   Hyponatremia   Hyperkalemia   Tobacco abuse   GERD without esophagitis   Darren Rogers is a 67 y.o. African-American male with medical history significant for alcohol abuse, chronic atrial fibrillation, nonobstructive coronary artery disease, PVD, and hypertension, presented to the emergency room with acute onset of upper abdominal pain with associated nausea and dry heaves over the last week.  He has been constipated.  Did not any fever or chills.  His last alcoholic drink was last night.  He usually drinks to 40 ounces of beer daily.  He was noted to be in atrial fibrillation with RVR for which she was given IV Cardizem bolus and drip in the ED.       Alcoholic pancreatitis -  received hydration with IV normal saline. - Lipase 195--179--153 - Pain meds prn. -  counseled for alcohol cessation. - on thiamine, folic acid and multivitamins. --PPI bid --abd pain much imprvoed. Tolerating FLD--will give soft diet today   Chronic atrial fibrillation with RVR ([facility]) - weaned off IV Cardizem drip. - on ASA -  continue his Lopressor + CCB --HR in the 70"s --follows with [facility] if needed--follows with Dr Darren Rogers - Given alcohol abuse he is likely not the best candidate for anticoagulation. --Echo 2022--EF 50-55%   Alcohol abuse - placed on alcohol withdrawal precautions. -as needed IV Ativan as needed. --CIWA 0   Hyperkalemia - potassium level with hydration--K 4.2    Hyponatremia - This is likely hypovolemic. -improving   Essential hypertension -  continue his antihypertensives.   Tobacco abuse - He was counseled for cessation  - place him on NicoDerm CQ patch as needed.   GERD without esophagitis -  continue PPI therapy.  Overall improving. Will d/c to home with out pt f/u cardiology and PCP. Pt agreeable     DVT prophylaxis: Lovenox.  Advanced Care Planning:  Code Status: full code.  Family Communication: none today Consults :none    Disposition: Home Diet recommendation:  Discharge Diet Orders (From admission, onward)     Start     Ordered   08/09/22 0000  Diet - low sodium heart healthy        08/09/22 1131           Cardiac diet DISCHARGE MEDICATION: Allergies as of 08/09/2022   No Known Allergies      Medication List     STOP taking these medications    nicotine 21 mg/24hr patch Commonly known as: NICODERM CQ - dosed in mg/24 hours   oxyCODONE 5 MG immediate release tablet Commonly known as: Roxicodone       TAKE these medications    aspirin EC 81 MG tablet Take 1 tablet (81 mg total) by mouth daily. Swallow whole.   atorvastatin 40 MG tablet Commonly known as: LIPITOR Take 1 tablet (40 mg total) by mouth daily.   diltiazem 180 MG 24 hr capsule Commonly known as: CARDIZEM CD Take 1 capsule (  180 mg total) by mouth daily.   metoprolol tartrate 100 MG tablet Commonly known as: LOPRESSOR Take 1 tablet (100 mg total) by mouth 2 (two) times daily.   multivitamin with minerals Tabs tablet Take 1 tablet by mouth daily. Start taking on: August 10, 2022   pantoprazole 40 MG tablet Commonly known as: PROTONIX Take 1 tablet (40 mg total) by mouth daily.   polyethylene glycol 17 g packet Commonly known as: MIRALAX / GLYCOLAX Take 17 g by mouth daily as needed.        Follow-up Information     Darren Guise, MD. Schedule an appointment as soon as possible for a visit in 1 week(s).   Specialties:  Internal Medicine, Cardiology Contact information: [place] [address] [phone]         Darren Hampshire, MD. Schedule an appointment as soon as possible for a visit in 2 week(s).   Specialty: Cardiology Why: chronic afib f/u Contact information: [place] [address] [phone]                 Filed Weights   08/06/22 1530 08/07/22 1731  Weight: 71 kg 73.9 kg     Condition at discharge: fair  The results of significant diagnostics from this hospitalization (including imaging, microbiology, ancillary and laboratory) are listed below for reference.   Imaging Studies: CT ABDOMEN PELVIS W CONTRAST  Result Date: 08/06/2022 CLINICAL DATA:  Abdominal pain EXAM: CT ABDOMEN AND PELVIS WITH CONTRAST TECHNIQUE: Multidetector CT imaging of the abdomen and pelvis was performed using the standard protocol following bolus administration of intravenous contrast. RADIATION DOSE REDUCTION: This exam was performed according to the departmental dose-optimization program which includes automated exposure control, adjustment of the mA and/or kV according to patient size and/or use of iterative reconstruction technique. CONTRAST:  157m OMNIPAQUE IOHEXOL 300 MG/ML  SOLN COMPARISON:  05/19/2022 FINDINGS: Lower chest: Mild bibasilar atelectasis.  Cardiomegaly. Hepatobiliary: Liver is within normal limits. Gallbladder is unremarkable. No intrahepatic or extrahepatic ductal dilatation. Pancreas: Within normal limits. Spleen: Within normal limits. Adrenals/Urinary Tract: Stomach/Bowel: Adrenal glands are within normal limits. Kidneys are within normal limits.  No hydronephrosis. Thick-walled bladder, although underdistended. Vascular/Lymphatic: Stomach is within normal limits. No evidence of bowel obstruction. Normal appendix (series 2/image 45). No colonic wall thickening or inflammatory changes. Reproductive: Prostate is notable for dystrophic  calcifications. Other: No abdominopelvic ascites. Musculoskeletal: Degenerative changes of the lumbar spine. IMPRESSION: Thick-walled bladder, although underdistended. Correlate for cystitis. Otherwise, no CT findings to account for the patient's abdominal pain. Electronically Signed   By: SJulian HyM.D.   On: 08/06/2022 19:42   DG Chest Portable 1 View  Result Date: 08/06/2022 CLINICAL DATA:  Cough and shortness of breath. EXAM: PORTABLE CHEST 1 VIEW COMPARISON:  Chest radiographs 10/19/2021 and 03/09/2021 FINDINGS: Cardiac silhouette and mediastinal contours are within normal limits. Mild-to-moderate calcification within aortic arch. The lungs are clear. No pleural effusion pneumothorax. IMPRESSION: No active disease. Electronically Signed   By: RYvonne KendallM.D.   On: 08/06/2022 16:48    Microbiology: Results for orders placed or performed during the hospital encounter of 05/19/22  Resp panel by RT-PCR (RSV, Flu A&B, Covid) Anterior Nasal Swab     Status: None   Collection Time: 05/23/22 11:23 AM   Specimen: Anterior Nasal Swab  Result Value Ref Range Status   SARS Coronavirus 2 by RT PCR NEGATIVE NEGATIVE Final    Comment: (NOTE) SARS-CoV-2 target nucleic  acids are NOT DETECTED.  The SARS-CoV-2 RNA is generally detectable in upper respiratory specimens during the acute phase of infection. The lowest concentration of SARS-CoV-2 viral copies this assay can detect is 138 copies/mL. A negative result does not preclude SARS-Cov-2 infection and should not be used as the sole basis for treatment or other patient management decisions. A negative result may occur with  improper specimen collection/handling, submission of specimen other than nasopharyngeal swab, presence of viral mutation(s) within the areas targeted by this assay, and inadequate number of viral copies(<138 copies/mL). A negative result must be combined with clinical observations, patient history, and  epidemiological information. The expected result is Negative.  Fact Sheet for Patients:  EntrepreneurPulse.com.au  Fact Sheet for Healthcare Providers:  IncredibleEmployment.be  This test is no t yet approved or cleared by the [place] FDA and  has been authorized for detection and/or diagnosis of SARS-CoV-2 by FDA under an Emergency Use Authorization (EUA). This EUA will remain  in effect (meaning this test can be used) for the duration of the COVID-19 declaration under Section 564(b)(1) of the Act, 21 U.S.C.section 360bbb-3(b)(1), unless the authorization is terminated  or revoked sooner.       Influenza A by PCR NEGATIVE NEGATIVE Final   Influenza B by PCR NEGATIVE NEGATIVE Final    Comment: (NOTE) The Xpert Xpress SARS-CoV-2/FLU/RSV plus assay is intended as an aid in the diagnosis of influenza from Nasopharyngeal swab specimens and should not be used as a sole basis for treatment. Nasal washings and aspirates are unacceptable for Xpert Xpress SARS-CoV-2/FLU/RSV testing.  Fact Sheet for Patients: EntrepreneurPulse.com.au  Fact Sheet for Healthcare Providers: IncredibleEmployment.be  This test is not yet approved or cleared by the [place] FDA and has been authorized for detection and/or diagnosis of SARS-CoV-2 by FDA under an Emergency Use Authorization (EUA). This EUA will remain in effect (meaning this test can be used) for the duration of the COVID-19 declaration under Section 564(b)(1) of the Act, 21 U.S.C. section 360bbb-3(b)(1), unless the authorization is terminated or revoked.     Resp Syncytial Virus by PCR NEGATIVE NEGATIVE Final    Comment: (NOTE) Fact Sheet for Patients: EntrepreneurPulse.com.au  Fact Sheet for Healthcare Providers: IncredibleEmployment.be  This test is not yet approved or cleared by the [place] FDA and has been  authorized for detection and/or diagnosis of SARS-CoV-2 by FDA under an Emergency Use Authorization (EUA). This EUA will remain in effect (meaning this test can be used) for the duration of the COVID-19 declaration under Section 564(b)(1) of the Act, 21 U.S.C. section 360bbb-3(b)(1), unless the authorization is terminated or revoked.  Performed at [facility], [address] [place], [place] [address]     Labs: CBC: Recent Labs  Lab 08/06/22 1533 08/07/22 0436  WBC 4.8 4.3  NEUTROABS 2.3  --   HGB 16.2 13.8  HCT 47.1 40.2  MCV 93.5 93.7  PLT 294 [id number]   Basic Metabolic Panel: Recent Labs  Lab 08/06/22 1845 08/07/22 0436  NA 133* 134*  K 5.3* 4.2  CL 100 102  CO2 19* 20*  GLUCOSE 63* 73  BUN 9 12  CREATININE 0.93 1.00  CALCIUM 8.7* 8.9   Liver Function Tests: Recent Labs  Lab 08/06/22 1845 08/07/22 0436  AST 60* 47*  ALT 39 35  ALKPHOS 85 81  BILITOT 1.5* 1.3*  PROT 7.3 6.8  ALBUMIN 3.8 3.5     Discharge time spent: greater than 30 minutes.  Signed: Fritzi Mandes, MD  [facility] 08/09/2022

## 2022-08-09 NOTE — Discharge Instructions (Signed)
Patient advised to abstain from drinking alcohol

## 2022-08-10 DIAGNOSIS — E871 Hypo-osmolality and hyponatremia: Secondary | ICD-10-CM | POA: Diagnosis not present

## 2022-08-10 DIAGNOSIS — I4891 Unspecified atrial fibrillation: Secondary | ICD-10-CM | POA: Diagnosis not present

## 2022-08-10 DIAGNOSIS — K219 Gastro-esophageal reflux disease without esophagitis: Secondary | ICD-10-CM | POA: Diagnosis not present

## 2022-08-10 DIAGNOSIS — K852 Alcohol induced acute pancreatitis without necrosis or infection: Secondary | ICD-10-CM | POA: Diagnosis not present

## 2022-08-10 MED ORDER — OXYCODONE HCL 5 MG PO TABS: 5.0000 mg | ORAL_TABLET | Freq: Three times a day (TID) | ORAL | 0 refills | Status: DC | PRN

## 2022-08-10 NOTE — Discharge Summary (Signed)
Physician Discharge Summary   Patient: Darren Rogers MRN: [id number] DOB: 12/23/1955  Admit date:     08/06/2022  Discharge date: 08/10/22  Discharge Physician: Fritzi Mandes   PCP: Darren Guise, MD   Recommendations at discharge:   follow-up cardiology Dr. Fletcher Anon in 1 to 2 weeks 2. follow-up PCP in 1 to 2 week  3. abstain from drinking alcohol  Discharge Diagnoses: Principal Problem:   Alcoholic pancreatitis Active Problems:   Chronic atrial fibrillation with RVR ([place])   Alcohol abuse   Essential hypertension   Hyponatremia   Hyperkalemia   Tobacco abuse   GERD without esophagitis   Darren Rogers is a 67 y.o. African-American male with medical history significant for alcohol abuse, chronic atrial fibrillation, nonobstructive coronary artery disease, PVD, and hypertension, presented to the emergency room with acute onset of upper abdominal pain with associated nausea and dry heaves over the last week.  He has been constipated.  Did not any fever or chills.  His last alcoholic drink was last night.  He usually drinks to 40 ounces of beer daily.  He was noted to be in atrial fibrillation with RVR for which she was given IV Cardizem bolus and drip in the ED.       Alcoholic pancreatitis -  received hydration with IV normal saline. - Lipase 195--179--153 - Pain meds prn. -  counseled for alcohol cessation. - on thiamine, folic acid and multivitamins. --PPI bid --abd pain much imprvoed. Tolerating FLD--on soft diet  PAD with tobacco use --pt was being followed at [facility] vascular sx--pt will now f/u with [facility] and vascular. --advise   Chronic atrial fibrillation with RVR ([place]) - weaned off IV Cardizem drip. - on ASA -  continue his Lopressor + CCB --HR in the 70"s --follows with [facility] if needed--follows with Dr Fletcher Anon - Given alcohol abuse he is likely not the best candidate for anticoagulation. --Echo 2022--EF 50-55%   Alcohol abuse - placed on alcohol withdrawal  precautions. -as needed IV Ativan as needed. --CIWA 0   Hyperkalemia - potassium level with hydration--K 4.2   Hyponatremia - This is likely hypovolemic. -improving   Essential hypertension -  continue his antihypertensives.   Tobacco abuse - He was counseled for cessation  - place him on NicoDerm CQ patch as needed.   GERD without esophagitis -  continue PPI therapy.  Overall improving. Will d/c to home with out pt f/u cardiology and PCP. Pt agreeable Pt will also f/u Vascular surgery in [place]     DVT prophylaxis: Lovenox.  Advanced Care Planning:  Code Status: full code.  Family Communication: none today Consults :none    Disposition: Home Diet recommendation:  Discharge Diet Orders (From admission, onward)     Start     Ordered   08/09/22 0000  Diet - low sodium heart healthy        08/09/22 1131           Cardiac diet DISCHARGE MEDICATION: Allergies as of 08/10/2022   No Known Allergies      Medication List     STOP taking these medications    nicotine 21 mg/24hr patch Commonly known as: NICODERM CQ - dosed in mg/24 hours       TAKE these medications    aspirin EC 81 MG tablet Take 1 tablet (81 mg total) by mouth daily. Swallow whole.   atorvastatin 40 MG tablet Commonly known as: LIPITOR Take 1 tablet (40 mg total) by mouth daily.  diltiazem 180 MG 24 hr capsule Commonly known as: CARDIZEM CD Take 1 capsule (180 mg total) by mouth daily.   metoprolol tartrate 100 MG tablet Commonly known as: LOPRESSOR Take 1 tablet (100 mg total) by mouth 2 (two) times daily.   multivitamin with minerals Tabs tablet Take 1 tablet by mouth daily.   oxyCODONE 5 MG immediate release tablet Commonly known as: Roxicodone Take 1 tablet (5 mg total) by mouth every 8 (eight) hours as needed for severe pain.   pantoprazole 40 MG tablet Commonly known as: PROTONIX Take 1 tablet (40 mg total) by mouth daily.   polyethylene glycol 17 g  packet Commonly known as: MIRALAX / GLYCOLAX Take 17 g by mouth daily as needed.        Follow-up Information     Darren Guise, MD. Schedule an appointment as soon as possible for a visit in 1 week(s).   Specialties: Internal Medicine, Cardiology Contact information: [place] [address] [phone]         Darren Hampshire, MD. Schedule an appointment as soon as possible for a visit in 2 week(s).   Specialty: Cardiology Why: chronic afib f/u Contact information: [address] [place] [address] [phone]         Darren Hartmann, NP. Go to.   Specialty: Vascular Surgery Why: on the appt that will be called to you Contact information: [address] [place] [address] [phone]                 Filed Weights   08/06/22 1530 08/07/22 1731  Weight: 71 kg 73.9 kg     Condition at discharge: fair  The results of significant diagnostics from this hospitalization (including imaging, microbiology, ancillary and laboratory) are listed below for reference.   Imaging Studies: CT ABDOMEN PELVIS W CONTRAST  Result Date: 08/06/2022 CLINICAL DATA:  Abdominal pain EXAM: CT ABDOMEN AND PELVIS WITH CONTRAST TECHNIQUE: Multidetector CT imaging of the abdomen and pelvis was performed using the standard protocol following bolus administration of intravenous contrast. RADIATION DOSE REDUCTION: This exam was performed according to the departmental dose-optimization program which includes automated exposure control, adjustment of the mA and/or kV according to patient size and/or use of iterative reconstruction technique. CONTRAST:  125m OMNIPAQUE IOHEXOL 300 MG/ML  SOLN COMPARISON:  05/19/2022 FINDINGS: Lower chest: Mild bibasilar atelectasis.  Cardiomegaly. Hepatobiliary: Liver is within normal limits. Gallbladder is unremarkable. No intrahepatic or extrahepatic ductal dilatation. Pancreas: Within normal limits.  Spleen: Within normal limits. Adrenals/Urinary Tract: Stomach/Bowel: Adrenal glands are within normal limits. Kidneys are within normal limits.  No hydronephrosis. Thick-walled bladder, although underdistended. Vascular/Lymphatic: Stomach is within normal limits. No evidence of bowel obstruction. Normal appendix (series 2/image 45). No colonic wall thickening or inflammatory changes. Reproductive: Prostate is notable for dystrophic calcifications. Other: No abdominopelvic ascites. Musculoskeletal: Degenerative changes of the lumbar spine. IMPRESSION: Thick-walled bladder, although underdistended. Correlate for cystitis. Otherwise, no CT findings to account for the patient's abdominal pain. Electronically Signed   By: SJulian HyM.D.   On: 08/06/2022 19:42   DG Chest Portable 1 View  Result Date: 08/06/2022 CLINICAL DATA:  Cough and shortness of breath. EXAM: PORTABLE CHEST 1 VIEW COMPARISON:  Chest radiographs 10/19/2021 and 03/09/2021 FINDINGS: Cardiac silhouette and mediastinal contours are within normal limits. Mild-to-moderate calcification within aortic arch. The lungs are clear. No pleural effusion pneumothorax. IMPRESSION: No active disease. Electronically Signed   By: RYvonne Kendall  M.D.   On: 08/06/2022 16:48    Microbiology: Results for orders placed or performed during the hospital encounter of 05/19/22  Resp panel by RT-PCR (RSV, Flu A&B, Covid) Anterior Nasal Swab     Status: None   Collection Time: 05/23/22 11:23 AM   Specimen: Anterior Nasal Swab  Result Value Ref Range Status   SARS Coronavirus 2 by RT PCR NEGATIVE NEGATIVE Final    Comment: (NOTE) SARS-CoV-2 target nucleic acids are NOT DETECTED.  The SARS-CoV-2 RNA is generally detectable in upper respiratory specimens during the acute phase of infection. The lowest concentration of SARS-CoV-2 viral copies this assay can detect is 138 copies/mL. A negative result does not preclude SARS-Cov-2 infection and should not be used  as the sole basis for treatment or other patient management decisions. A negative result may occur with  improper specimen collection/handling, submission of specimen other than nasopharyngeal swab, presence of viral mutation(s) within the areas targeted by this assay, and inadequate number of viral copies(<138 copies/mL). A negative result must be combined with clinical observations, patient history, and epidemiological information. The expected result is Negative.  Fact Sheet for Patients:  EntrepreneurPulse.com.au  Fact Sheet for Healthcare Providers:  IncredibleEmployment.be  This test is no t yet approved or cleared by the [place] FDA and  has been authorized for detection and/or diagnosis of SARS-CoV-2 by FDA under an Emergency Use Authorization (EUA). This EUA will remain  in effect (meaning this test can be used) for the duration of the COVID-19 declaration under Section 564(b)(1) of the Act, 21 U.S.C.section 360bbb-3(b)(1), unless the authorization is terminated  or revoked sooner.       Influenza A by PCR NEGATIVE NEGATIVE Final   Influenza B by PCR NEGATIVE NEGATIVE Final    Comment: (NOTE) The Xpert Xpress SARS-CoV-2/FLU/RSV plus assay is intended as an aid in the diagnosis of influenza from Nasopharyngeal swab specimens and should not be used as a sole basis for treatment. Nasal washings and aspirates are unacceptable for Xpert Xpress SARS-CoV-2/FLU/RSV testing.  Fact Sheet for Patients: EntrepreneurPulse.com.au  Fact Sheet for Healthcare Providers: IncredibleEmployment.be  This test is not yet approved or cleared by the [place] FDA and has been authorized for detection and/or diagnosis of SARS-CoV-2 by FDA under an Emergency Use Authorization (EUA). This EUA will remain in effect (meaning this test can be used) for the duration of the COVID-19 declaration under Section 564(b)(1)  of the Act, 21 U.S.C. section 360bbb-3(b)(1), unless the authorization is terminated or revoked.     Resp Syncytial Virus by PCR NEGATIVE NEGATIVE Final    Comment: (NOTE) Fact Sheet for Patients: EntrepreneurPulse.com.au  Fact Sheet for Healthcare Providers: IncredibleEmployment.be  This test is not yet approved or cleared by the [place] FDA and has been authorized for detection and/or diagnosis of SARS-CoV-2 by FDA under an Emergency Use Authorization (EUA). This EUA will remain in effect (meaning this test can be used) for the duration of the COVID-19 declaration under Section 564(b)(1) of the Act, 21 U.S.C. section 360bbb-3(b)(1), unless the authorization is terminated or revoked.  Performed at [facility], [address] [place], [place] [address]     Labs: CBC: Recent Labs  Lab 08/06/22 1533 08/07/22 0436  WBC 4.8 4.3  NEUTROABS 2.3  --   HGB 16.2 13.8  HCT 47.1 40.2  MCV 93.5 93.7  PLT 294 [id number]   Basic Metabolic Panel: Recent Labs  Lab 08/06/22 1845 08/07/22 0436  NA 133* 134*  K 5.3*  4.2  CL 100 102  CO2 19* 20*  GLUCOSE 63* 73  BUN 9 12  CREATININE 0.93 1.00  CALCIUM 8.7* 8.9   Liver Function Tests: Recent Labs  Lab 08/06/22 1845 08/07/22 0436  AST 60* 47*  ALT 39 35  ALKPHOS 85 81  BILITOT 1.5* 1.3*  PROT 7.3 6.8  ALBUMIN 3.8 3.5     Discharge time spent: greater than 30 minutes.  Signed: Fritzi Mandes, MD [facility] 08/10/2022

## 2022-08-10 NOTE — Progress Notes (Signed)
Pt states that before 3 days ago, he hadn't had a bowel movement in 2 weeks. Believes that's ulitmately what triggered the pancreatitis.

## 2022-08-10 NOTE — Care Management Important Message (Signed)
Important Message  Patient Details  Name: Darren Rogers MRN: [id number] Date of Birth: December 22, 1955   Medicare Important Message Given:  Yes     Dannette Barbara 08/10/2022, 11:23 AM

## 2022-08-10 NOTE — Progress Notes (Signed)
Mobility Specialist - Progress Note  Pre-mobility: HR 92, SpO2 99%  During mobility: HR 106, SpO2 96% Post-mobility: HR 112, SPO2 99%   08/10/22 1008  Mobility  Activity Ambulated independently in hallway  Level of Assistance Modified independent, requires aide device or extra time  Assistive Device Front wheel walker  Distance Ambulated (ft) 40 ft  Activity Response Tolerated well  $Mobility charge 1 Mobility   Pt in fowler position upon entry, utilizing RA. Pt voiced having some dizziness while in bed (3/10), however willing to amb. Pt completed bed mob ModI; extra time needed. Pt STS to RW MinA, voices dizziness upon standing (5/10), still willing to amb. Pt amb 40 ft in the hallway ModI,extra time needed for sequencing steps. Approximately 20 ft into amb Pt voiced having RLE pain and stated " my dizziness feels worse than yesterday". Pt returned to bed, left supine with needs within reach. MD notified for dizziness.   Candie Mile Mobility Specialist 08/10/22 10:18 AM

## 2022-08-10 NOTE — TOC Transition Note (Signed)
Transition of Care [facility]) - CM/SW Discharge Note   Patient Details  Name: Darren Rogers MRN: [id number] Date of Birth: March 09, 1956  [facility]) CM/SW Contact:  Laurena Slimmer, RN Phone Number: 08/10/2022, 11:38 AM   Clinical Narrative:    Spoke with patient regarding substance abuse resources. He refused to receive any information.  Patient denied any questions  TOC signing off          Patient Goals and CMS Choice      Discharge Placement                         Discharge Plan and Services Additional resources added to the After Visit Summary for                                       Social Determinants of Health (SDOH) Interventions SDOH Screenings   Food Insecurity: No Food Insecurity (08/07/2022)  Housing: Low Risk  (08/07/2022)  Transportation Needs: No Transportation Needs (08/07/2022)  Utilities: Not At Risk (08/07/2022)  Tobacco Use: High Risk (08/07/2022)     Readmission Risk Interventions    10/13/2020   11:28 AM  Readmission Risk Prevention Plan  Transportation Screening Complete  Palliative Care Screening Not Applicable  Medication Review (RN Care Manager) Complete

## 2022-08-25 ENCOUNTER — Ambulatory Visit: Payer: 59 | Admitting: Internal Medicine

## 2022-08-27 ENCOUNTER — Encounter (INDEPENDENT_AMBULATORY_CARE_PROVIDER_SITE_OTHER): Payer: 59 | Admitting: Nurse Practitioner

## 2022-09-04 ENCOUNTER — Encounter (INDEPENDENT_AMBULATORY_CARE_PROVIDER_SITE_OTHER): Payer: 59 | Admitting: Nurse Practitioner

## 2022-09-15 ENCOUNTER — Encounter (INDEPENDENT_AMBULATORY_CARE_PROVIDER_SITE_OTHER): Payer: 59 | Admitting: Nurse Practitioner

## 2022-09-17 ENCOUNTER — Ambulatory Visit: Payer: 59 | Attending: Nurse Practitioner | Admitting: Nurse Practitioner

## 2022-09-17 NOTE — Progress Notes (Deleted)
Office Visit    Patient Name: Darren Rogers Date of Encounter: 09/17/2022  Primary Care Provider:  Lyndon Code, MD Primary Cardiologist:  Darren Bears, MD  Chief Complaint    67 year old male with a history of persistent atrial fibrillation, nonobstructive CAD, hypertension, hyperlipidemia, noncompliance, polysubstance abuse, presents for follow-up for atrial fibrillation after recent hospitalization for pancreatitis.  Past Medical History    Past Medical History:  Diagnosis Date   Claudication (HCC)    a. 06/2015 ABI: R - 0.73, L - 0.73. 30-49% bilat SFA stenosis. 50-74% R Profunda stenosis; b. 01/2017 ABI: R 0.89, L 0.88, TBI R 0.65, L 1.0.   Erectile dysfunction    Essential hypertension    History of echocardiogram    a. 03/29/2014: EF 55-60%, mild LVH, normal RVSP;  b. 05/2015 Echo: EF 60-65%, triv AI, mild MR; c. 03/2021 Echo: EF 55-60%, no rwma, mild LVH, nl RV fxn, mildly dil LA, mild MR.   Hyperlipidemia    Non-obstructive CAD    a. 01/27/2014 Cath: LM nl, LAD mild diff dz w/o obs, LCX no sig obs - scattered 20-30% mLCx, RCA no obs dz, EF 70%; b. 06/2015 MV; EF 60%, no ischemia; c. 07/2020 MV: No ischemia/scar.   Persistent atrial fibrillation (HCC)    a. Pt says Dx >10yrs ago w/ ? RFCA @ [facility];  b. Recurrent 12/2013;  b. Rx Flecainide and Xarelto-->Intermittent compliance; c. 03/2021 Zio: 100% Afib w/ avg rate of 99 bpm. Rare PVCs.   PVC's (premature ventricular contractions)    a. 05/2017 Zio: 4000 PVCs in 48 hrs (2%). Brief run of SVT.   Tobacco abuse    a. ongoing - 1 ppd.   Past Surgical History:  Procedure Laterality Date   CARDIAC CATHETERIZATION     CARDIAC CATHETERIZATION     [facility]   LEFT HEART CATH Right 01/27/2014   Procedure: LEFT HEART CATH;  Surgeon: Darren Chapman, MD;  Location: [facility] CATH LAB;  Service: Cardiovascular;  Laterality: Right;    Allergies  No Known Allergies  History of Present Illness    67 year old male with above complex past  medical history including persistent atrial fibrillation, nonobstructive CAD, hypertension, hyperlipidemia, noncompliance, and polysubstance abuse.  He was previously diagnosed with atrial fibrillation greater than 20 years ago and reports a history of some kind of ablation at [facility].  He had recurrent atrial fibrillation in August 2015, and in the setting, experienced chest pain.  He underwent diagnostic catheterization which showed mild, nonobstructive CAD.  A-fib was previously managed with flecainide and Xarelto however, compliance was poor.  In January 2017, he had recurrent chest pain atrial fibrillation underwent stress testing which was nonischemic.  He also reported lower extremity claudication underwent ABIs, which were abnormal suggestive of mild to moderate bilateral SFA and right profunda stenoses.  Recurrent atrial fibrillation August 2018 in the setting of noncompliance was resumed on metoprolol, flecainide, and Xarelto however, was lost to follow-up.  He had admissions in March, April, and October 2022 in the setting of rapid atrial fibrillation and atypical chest pain in the setting of noncompliance.  It was felt that A-fib was likely transitioning to a more permanent state and a ZIO monitor was placed in October 2022 subsequently showing 100% atrial fibrillation burden with an average heart rate of 92 bpm.  He failed to follow-up after discharge but did return to clinic in March 2023, at which time he was out of his medications.  Diltiazem, metoprolol, and Eliquis  were refilled with the plan for ongoing rate control.  Darren Rogers was admitted to [facility] in December 2023 secondary to abdominal pain and alcoholic pancreatitis in the setting of ongoing heavy alcohol use.  He was recently readmitted in early March for the same and during that admission, required intravenous diltiazem in the setting of rapid atrial fibrillation.  He was discharged home on beta-blocker and calcium channel  blocker and anticoagulation was discontinued in favor of aspirin given long-term noncompliance and alcohol use.  Home Medications    Current Outpatient Medications  Medication Sig Dispense Refill   aspirin EC 81 MG tablet Take 1 tablet (81 mg total) by mouth daily. Swallow whole. 30 tablet 0   atorvastatin (LIPITOR) 40 MG tablet Take 1 tablet (40 mg total) by mouth daily. (Patient not taking: Reported on 08/06/2022) 30 tablet 0   diltiazem (CARDIZEM CD) 180 MG 24 hr capsule Take 1 capsule (180 mg total) by mouth daily. 90 capsule 3   metoprolol tartrate (LOPRESSOR) 100 MG tablet Take 1 tablet (100 mg total) by mouth 2 (two) times daily. 180 tablet 3   Multiple Vitamin (MULTIVITAMIN WITH MINERALS) TABS tablet Take 1 tablet by mouth daily. 30 tablet 1   oxyCODONE (ROXICODONE) 5 MG immediate release tablet Take 1 tablet (5 mg total) by mouth every 8 (eight) hours as needed for severe pain. 20 tablet 0   pantoprazole (PROTONIX) 40 MG tablet Take 1 tablet (40 mg total) by mouth daily. 30 tablet 11   polyethylene glycol (MIRALAX / GLYCOLAX) 17 g packet Take 17 g by mouth daily as needed. 30 each 0   No current facility-administered medications for this visit.     Review of Systems    ***.  All other systems reviewed and are otherwise negative except as noted above.    Physical Exam    VS:  There were no vitals taken for this visit. , BMI There is no height or weight on file to calculate BMI.     GEN: Well nourished, well developed, in no acute distress. HEENT: normal. Neck: Supple, no JVD, carotid bruits, or masses. Cardiac: RRR, no murmurs, rubs, or gallops. No clubbing, cyanosis, edema.  Radials 2+/PT 2+ and equal bilaterally.  Respiratory:  Respirations regular and unlabored, clear to auscultation bilaterally. GI: Soft, nontender, nondistended, BS + x 4. MS: no deformity or atrophy. Skin: warm and dry, no rash. Neuro:  Strength and sensation are intact. Psych: Normal  affect.  Accessory Clinical Findings    ECG personally reviewed by me today - *** - no acute changes.  Lab Results  Component Value Date   WBC 4.3 08/07/2022   HGB 13.8 08/07/2022   HCT 40.2 08/07/2022   MCV 93.7 08/07/2022   PLT 226 08/07/2022   Lab Results  Component Value Date   CREATININE 1.00 08/07/2022   BUN 12 08/07/2022   NA 134 (L) 08/07/2022   K 4.2 08/07/2022   CL 102 08/07/2022   CO2 20 (L) 08/07/2022   Lab Results  Component Value Date   ALT 35 08/07/2022   AST 47 (H) 08/07/2022   ALKPHOS 81 08/07/2022   BILITOT 1.3 (H) 08/07/2022   Lab Results  Component Value Date   CHOL 233 (H) 09/10/2020   HDL 47 09/10/2020   LDLCALC 131 (H) 09/10/2020   TRIG 92 05/19/2022   CHOLHDL 5.0 09/10/2020    Lab Results  Component Value Date   HGBA1C 6.1 07/30/2016    Assessment & Plan  1.  ***   Nicolasa Ducking, NP 09/17/2022, 12:58 PM

## 2022-09-18 ENCOUNTER — Encounter: Payer: Self-pay | Admitting: Nurse Practitioner

## 2022-09-20 ENCOUNTER — Inpatient Hospital Stay
Admission: EM | Admit: 2022-09-20 | Discharge: 2022-09-24 | DRG: 308 | Disposition: A | Discharge status: Home / Self Care | Payer: 59 | Attending: Student | Admitting: Student

## 2022-09-20 ENCOUNTER — Emergency Department: Payer: 59

## 2022-09-20 DIAGNOSIS — F1010 Alcohol abuse, uncomplicated: Secondary | ICD-10-CM | POA: Diagnosis not present

## 2022-09-20 DIAGNOSIS — E871 Hypo-osmolality and hyponatremia: Secondary | ICD-10-CM | POA: Diagnosis present

## 2022-09-20 DIAGNOSIS — I4891 Unspecified atrial fibrillation: Secondary | ICD-10-CM | POA: Diagnosis not present

## 2022-09-20 DIAGNOSIS — E785 Hyperlipidemia, unspecified: Secondary | ICD-10-CM | POA: Diagnosis present

## 2022-09-20 DIAGNOSIS — I10 Essential (primary) hypertension: Secondary | ICD-10-CM | POA: Diagnosis present

## 2022-09-20 DIAGNOSIS — F17210 Nicotine dependence, cigarettes, uncomplicated: Secondary | ICD-10-CM | POA: Diagnosis present

## 2022-09-20 DIAGNOSIS — Z720 Tobacco use: Secondary | ICD-10-CM | POA: Diagnosis present

## 2022-09-20 DIAGNOSIS — K852 Alcohol induced acute pancreatitis without necrosis or infection: Secondary | ICD-10-CM | POA: Diagnosis not present

## 2022-09-20 DIAGNOSIS — K760 Fatty (change of) liver, not elsewhere classified: Secondary | ICD-10-CM | POA: Diagnosis present

## 2022-09-20 DIAGNOSIS — F1410 Cocaine abuse, uncomplicated: Secondary | ICD-10-CM | POA: Diagnosis present

## 2022-09-20 DIAGNOSIS — Z7982 Long term (current) use of aspirin: Secondary | ICD-10-CM

## 2022-09-20 DIAGNOSIS — K219 Gastro-esophageal reflux disease without esophagitis: Secondary | ICD-10-CM | POA: Diagnosis present

## 2022-09-20 DIAGNOSIS — Z79899 Other long term (current) drug therapy: Secondary | ICD-10-CM

## 2022-09-20 DIAGNOSIS — Z8249 Family history of ischemic heart disease and other diseases of the circulatory system: Secondary | ICD-10-CM

## 2022-09-20 DIAGNOSIS — I482 Chronic atrial fibrillation, unspecified: Principal | ICD-10-CM | POA: Diagnosis present

## 2022-09-20 DIAGNOSIS — Z833 Family history of diabetes mellitus: Secondary | ICD-10-CM

## 2022-09-20 DIAGNOSIS — I2510 Atherosclerotic heart disease of native coronary artery without angina pectoris: Secondary | ICD-10-CM | POA: Diagnosis present

## 2022-09-20 DIAGNOSIS — F1910 Other psychoactive substance abuse, uncomplicated: Secondary | ICD-10-CM | POA: Diagnosis present

## 2022-09-20 LAB — CBC WITH DIFFERENTIAL/PLATELET
Abs Immature Granulocytes: 0.01 10*3/uL (ref 0.00–0.07)
Basophils Absolute: 0 10*3/uL (ref 0.0–0.1)
Basophils Relative: 1 %
Eosinophils Absolute: 0 10*3/uL (ref 0.0–0.5)
Eosinophils Relative: 1 %
HCT: 41.2 % (ref 39.0–52.0)
Hemoglobin: 14.2 g/dL (ref 13.0–17.0)
Immature Granulocytes: 0 %
Lymphocytes Relative: 58 %
Lymphs Abs: 2.4 10*3/uL (ref 0.7–4.0)
MCH: 31.7 pg (ref 26.0–34.0)
MCHC: 34.5 g/dL (ref 30.0–36.0)
MCV: 92 fL (ref 80.0–100.0)
Monocytes Absolute: 0.4 10*3/uL (ref 0.1–1.0)
Monocytes Relative: 8 %
Neutro Abs: 1.4 10*3/uL — ABNORMAL LOW (ref 1.7–7.7)
Neutrophils Relative %: 32 %
Platelets: 206 10*3/uL (ref 150–400)
RBC: 4.48 MIL/uL (ref 4.22–5.81)
RDW: 15.5 % (ref 11.5–15.5)
WBC: 4.2 10*3/uL (ref 4.0–10.5)
nRBC: 0 % (ref 0.0–0.2)

## 2022-09-20 LAB — PHOSPHORUS: Phosphorus: 3.4 mg/dL (ref 2.5–4.6)

## 2022-09-20 LAB — TROPONIN I (HIGH SENSITIVITY)
Troponin I (High Sensitivity): 10 ng/L (ref <18)
Troponin I (High Sensitivity): 11 ng/L (ref <18)

## 2022-09-20 LAB — COMPREHENSIVE METABOLIC PANEL
ALT: 29 U/L (ref 0–44)
AST: 45 U/L — ABNORMAL HIGH (ref 15–41)
Albumin: 3.7 g/dL (ref 3.5–5.0)
Alkaline Phosphatase: 81 U/L (ref 38–126)
Anion gap: 14 (ref 5–15)
BUN: 6 mg/dL — ABNORMAL LOW (ref 8–23)
CO2: 17 mmol/L — ABNORMAL LOW (ref 22–32)
Calcium: 8.9 mg/dL (ref 8.9–10.3)
Chloride: 98 mmol/L (ref 98–111)
Creatinine, Ser: 1.04 mg/dL (ref 0.61–1.24)
GFR, Estimated: >60 mL/min (ref >60)
Glucose, Bld: 93 mg/dL (ref 70–99)
Potassium: 3.8 mmol/L (ref 3.5–5.1)
Sodium: 129 mmol/L — ABNORMAL LOW (ref 135–145)
Total Bilirubin: 0.8 mg/dL (ref 0.3–1.2)
Total Protein: 7 g/dL (ref 6.5–8.1)

## 2022-09-20 LAB — URINALYSIS, ROUTINE W REFLEX MICROSCOPIC
Bacteria, UA: NONE SEEN
Bilirubin Urine: NEGATIVE
Glucose, UA: NEGATIVE mg/dL
Hgb urine dipstick: NEGATIVE
Ketones, ur: NEGATIVE mg/dL
Leukocytes,Ua: NEGATIVE
Nitrite: NEGATIVE
Protein, ur: NEGATIVE mg/dL
Specific Gravity, Urine: 1.002 — ABNORMAL LOW (ref 1.005–1.030)
Squamous Epithelial / HPF: NONE SEEN /HPF (ref 0–5)
pH: 6 (ref 5.0–8.0)

## 2022-09-20 LAB — TRIGLYCERIDES: Triglycerides: 126 mg/dL (ref <150)

## 2022-09-20 LAB — LIPASE, BLOOD: Lipase: 170 U/L — ABNORMAL HIGH (ref 11–51)

## 2022-09-20 LAB — MAGNESIUM: Magnesium: 1.9 mg/dL (ref 1.7–2.4)

## 2022-09-20 MED ORDER — NICOTINE 21 MG/24HR TD PT24
21.0000 mg | MEDICATED_PATCH | Freq: Every day | TRANSDERMAL | Status: DC
  Filled 2022-09-20 (×5): qty 1

## 2022-09-20 MED ORDER — LORAZEPAM 2 MG/ML IJ SOLN: 1.0000 mg | INTRAMUSCULAR | Status: AC | PRN

## 2022-09-20 MED ORDER — THIAMINE HCL 100 MG/ML IJ SOLN: 100.0000 mg | Freq: Every day | INTRAMUSCULAR | Status: DC

## 2022-09-20 MED ORDER — THIAMINE MONONITRATE 100 MG PO TABS
100.0000 mg | ORAL_TABLET | Freq: Every day | ORAL | Status: DC
  Administered 2022-09-20 – 2022-09-24 (×5): 100 mg via ORAL
  Filled 2022-09-20 (×5): qty 1

## 2022-09-20 MED ORDER — FENTANYL CITRATE PF 50 MCG/ML IJ SOSY
50.0000 ug | PREFILLED_SYRINGE | Freq: Once | INTRAMUSCULAR | Status: AC
  Administered 2022-09-20: 50 ug via INTRAVENOUS
  Filled 2022-09-20: qty 1

## 2022-09-20 MED ORDER — DILTIAZEM HCL ER COATED BEADS 180 MG PO CP24
180.0000 mg | ORAL_CAPSULE | Freq: Every day | ORAL | Status: DC
  Administered 2022-09-20 – 2022-09-21 (×2): 180 mg via ORAL
  Filled 2022-09-20 (×2): qty 1

## 2022-09-20 MED ORDER — ONDANSETRON HCL 4 MG/2ML IJ SOLN
4.0000 mg | Freq: Three times a day (TID) | INTRAMUSCULAR | Status: DC | PRN
  Administered 2022-09-24: 4 mg via INTRAVENOUS
  Filled 2022-09-20: qty 2

## 2022-09-20 MED ORDER — ENOXAPARIN SODIUM 40 MG/0.4ML IJ SOSY
40.0000 mg | PREFILLED_SYRINGE | INTRAMUSCULAR | Status: DC
  Filled 2022-09-20 (×3): qty 0.4

## 2022-09-20 MED ORDER — IOHEXOL 300 MG/ML  SOLN
100.0000 mL | Freq: Once | INTRAMUSCULAR | Status: AC | PRN
  Administered 2022-09-20: 100 mL via INTRAVENOUS

## 2022-09-20 MED ORDER — DILTIAZEM HCL-DEXTROSE 125-5 MG/125ML-% IV SOLN (PREMIX)
5.0000 mg/h | INTRAVENOUS | Status: DC
  Administered 2022-09-20 – 2022-09-22 (×4): 5 mg/h via INTRAVENOUS
  Filled 2022-09-20 (×4): qty 125

## 2022-09-20 MED ORDER — FOLIC ACID 1 MG PO TABS
1.0000 mg | ORAL_TABLET | Freq: Every day | ORAL | Status: DC
  Administered 2022-09-20 – 2022-09-24 (×5): 1 mg via ORAL
  Filled 2022-09-20 (×5): qty 1

## 2022-09-20 MED ORDER — OXYCODONE-ACETAMINOPHEN 5-325 MG PO TABS
1.0000 | ORAL_TABLET | ORAL | Status: DC | PRN
  Administered 2022-09-21 – 2022-09-23 (×4): 1 via ORAL
  Filled 2022-09-20 (×4): qty 1

## 2022-09-20 MED ORDER — ACETAMINOPHEN 325 MG PO TABS: 650.0000 mg | ORAL_TABLET | Freq: Four times a day (QID) | ORAL | Status: DC | PRN

## 2022-09-20 MED ORDER — LORAZEPAM 1 MG PO TABS: 1.0000 mg | ORAL_TABLET | ORAL | Status: AC | PRN

## 2022-09-20 MED ORDER — LORAZEPAM 2 MG/ML IJ SOLN: 0.0000 mg | Freq: Two times a day (BID) | INTRAMUSCULAR | Status: AC

## 2022-09-20 MED ORDER — POLYETHYLENE GLYCOL 3350 17 G PO PACK: 17.0000 g | PACK | Freq: Every day | ORAL | Status: DC | PRN

## 2022-09-20 MED ORDER — SODIUM CHLORIDE 0.9 % IV SOLN: INTRAVENOUS | Status: DC

## 2022-09-20 MED ORDER — METOPROLOL TARTRATE 50 MG PO TABS
100.0000 mg | ORAL_TABLET | Freq: Two times a day (BID) | ORAL | Status: DC
  Administered 2022-09-20 – 2022-09-24 (×9): 100 mg via ORAL
  Filled 2022-09-20 (×9): qty 2

## 2022-09-20 MED ORDER — LACTATED RINGERS IV BOLUS
1000.0000 mL | Freq: Once | INTRAVENOUS | Status: AC
  Administered 2022-09-20: 1000 mL via INTRAVENOUS

## 2022-09-20 MED ORDER — HYDROMORPHONE HCL 1 MG/ML IJ SOLN
1.0000 mg | INTRAMUSCULAR | Status: DC | PRN
  Administered 2022-09-20 – 2022-09-21 (×2): 1 mg via INTRAVENOUS
  Filled 2022-09-20 (×2): qty 1

## 2022-09-20 MED ORDER — ASPIRIN 81 MG PO TBEC
81.0000 mg | DELAYED_RELEASE_TABLET | Freq: Every day | ORAL | Status: DC
  Administered 2022-09-20 – 2022-09-22 (×3): 81 mg via ORAL
  Filled 2022-09-20 (×3): qty 1

## 2022-09-20 MED ORDER — DILTIAZEM HCL 25 MG/5ML IV SOLN
10.0000 mg | Freq: Once | INTRAVENOUS | Status: AC
  Administered 2022-09-20: 10 mg via INTRAVENOUS
  Filled 2022-09-20: qty 5

## 2022-09-20 MED ORDER — HYDRALAZINE HCL 20 MG/ML IJ SOLN: 5.0000 mg | INTRAMUSCULAR | Status: DC | PRN

## 2022-09-20 MED ORDER — LORAZEPAM 2 MG/ML IJ SOLN
0.0000 mg | Freq: Four times a day (QID) | INTRAMUSCULAR | Status: AC
  Administered 2022-09-20: 1 mg via INTRAVENOUS
  Filled 2022-09-20: qty 1

## 2022-09-20 MED ORDER — ADULT MULTIVITAMIN W/MINERALS CH
1.0000 | ORAL_TABLET | Freq: Every day | ORAL | Status: DC
  Administered 2022-09-20 – 2022-09-24 (×5): 1 via ORAL
  Filled 2022-09-20 (×5): qty 1

## 2022-09-20 MED ORDER — PANTOPRAZOLE SODIUM 40 MG PO TBEC
40.0000 mg | DELAYED_RELEASE_TABLET | Freq: Every day | ORAL | Status: DC
  Administered 2022-09-20 – 2022-09-24 (×5): 40 mg via ORAL
  Filled 2022-09-20 (×5): qty 1

## 2022-09-20 MED ORDER — SODIUM CHLORIDE 1 G PO TABS
1.0000 g | ORAL_TABLET | Freq: Two times a day (BID) | ORAL | Status: DC
  Administered 2022-09-20 – 2022-09-24 (×9): 1 g via ORAL
  Filled 2022-09-20 (×10): qty 1

## 2022-09-20 NOTE — ED Notes (Signed)
Moved dilt drip to new IV L AC.

## 2022-09-20 NOTE — ED Notes (Signed)
Messaged pharmacy to verify PO diltiazem then will give all PO meds together.

## 2022-09-20 NOTE — ED Triage Notes (Signed)
Pt from home called EMS for chest pain, Pt was in A fib RVR HR 170 EMS gave him 10 mg Cardizem, pt HR now 105. Pt reports increased abdominal pain (pt states he was told he has pancreatic problems and think it may be related)

## 2022-09-20 NOTE — ED Provider Notes (Signed)
[facility] Provider Note    Event Date/Time   First MD Initiated Contact with Patient 09/20/22 0522     (approximate)   History   Chest Pain   HPI  Darren Rogers is a 67 y.o. male who presents to the ED for evaluation of Chest Pain   I reviewed medical DC summary from 3/11.  History of ethanol abuse, chronic A-fib and PAD.  He was admitted for alcohol pancreatitis.  Not anticoagulated with A-fib considering his ethanol abuse  Patient presents to the ED with recurrence of epigastric lower chest discomfort.  Reports similar sensation to episodes of pancreatitis in the past.  Continues to drink 1-2 40 ounce beers per day.  Physical Exam   Triage Vital Signs: ED Triage Vitals  Enc Vitals Group     BP      Pulse      Resp      Temp      Temp src      SpO2      Weight      Height      Head Circumference      Peak Flow      Pain Score      Pain Loc      Pain Edu?      Excl. in GC?     Most recent vital signs: Vitals:   09/20/22 0527 09/20/22 0540  BP: 122/77   Pulse: (!) 114   Resp: 19   Temp:  98 F (36.7 C)  SpO2: 98%     General: Awake, no distress.  CV:  Good peripheral perfusion.  Tachycardic and irregular Resp:  Normal effort.  Abd:  No distention.  Epigastric tenderness, benign lower abdomen MSK:  No deformity noted.  Neuro:  No focal deficits appreciated. Other:     ED Results / Procedures / Treatments   Labs (all labs ordered are listed, but only abnormal results are displayed) Labs Reviewed  LIPASE, BLOOD - Abnormal; Notable for the following components:      Result Value   Lipase 170 (*)    All other components within normal limits  URINALYSIS, ROUTINE W REFLEX MICROSCOPIC - Abnormal; Notable for the following components:   Color, Urine STRAW (*)    APPearance CLEAR (*)    Specific Gravity, Urine 1.002 (*)    All other components within normal limits  COMPREHENSIVE METABOLIC PANEL - Abnormal; Notable for the  following components:   Sodium 129 (*)    CO2 17 (*)    BUN 6 (*)    AST 45 (*)    All other components within normal limits  CBC WITH DIFFERENTIAL/PLATELET - Abnormal; Notable for the following components:   Neutro Abs 1.4 (*)    All other components within normal limits  MAGNESIUM  TROPONIN I (HIGH SENSITIVITY)    EKG A-fib with RVR, rate of 115 bpm.  Normal axis and intervals.  Nonspecific ST changes without STEMI.  RADIOLOGY CXR interpreted by me without evidence of acute cardiopulmonary pathology.  Official radiology report(s): DG Chest Portable 1 View  Result Date: 09/20/2022 CLINICAL DATA:  67 year old male with history of atrial fibrillation with rapid ventricular rhythm. EXAM: PORTABLE CHEST 1 VIEW COMPARISON:  Chest x-ray 08/06/2022. FINDINGS: Lung volumes are normal. No acute consolidative airspace disease. No pleural effusions. No pneumothorax. No evidence of pulmonary edema. Mild cardiomegaly (unchanged). Upper mediastinal contours are within normal limits. IMPRESSION: 1. No radiographic evidence of acute cardiopulmonary disease. 2.  Mild cardiomegaly. Electronically Signed   By: Trudie Reed M.D.   On: 09/20/2022 06:08    PROCEDURES and INTERVENTIONS:  .1-3 Lead EKG Interpretation  Performed by: Delton Prairie, MD Authorized by: Delton Prairie, MD     Interpretation: abnormal     ECG rate:  119   ECG rate assessment: tachycardic     Rhythm: atrial fibrillation     Ectopy: none     Conduction: normal   .Critical Care  Performed by: Delton Prairie, MD Authorized by: Delton Prairie, MD   Critical care provider statement:    Critical care time (minutes):  30   Critical care time was exclusive of:  Separately billable procedures and treating other patients   Critical care was necessary to treat or prevent imminent or life-threatening deterioration of the following conditions:  Circulatory failure and cardiac failure   Critical care was time spent personally by me on  the following activities:  Development of treatment plan with patient or surrogate, discussions with consultants, evaluation of patient's response to treatment, examination of patient, ordering and review of laboratory studies, ordering and review of radiographic studies, ordering and performing treatments and interventions, pulse oximetry, re-evaluation of patient's condition and review of old charts   Medications  diltiazem (CARDIZEM) 125 mg in dextrose 5% 125 mL (1 mg/mL) infusion (5 mg/hr Intravenous New Bag/Given 09/20/22 0630)  fentaNYL (SUBLIMAZE) injection 50 mcg (has no administration in time range)  lactated ringers bolus 1,000 mL (1,000 mLs Intravenous New Bag/Given 09/20/22 0539)  diltiazem (CARDIZEM) injection 10 mg (10 mg Intravenous Given 09/20/22 0622)  iohexol (OMNIPAQUE) 300 MG/ML solution 100 mL (100 mLs Intravenous Contrast Given 09/20/22 0647)     IMPRESSION / MDM / ASSESSMENT AND PLAN / ED COURSE  I reviewed the triage vital signs and the nursing notes.  Differential diagnosis includes, but is not limited to, ACS, PTX, PNA, muscle strain/spasm, PE, dissection, anxiety, pleural effusion  {Patient presents with symptoms of an acute illness or injury that is potentially life-threatening.  67 year old drinker presents to the ED with evidence of recurrence of alcoholic pancreatitis precipitating A-fib with RVR requiring medical admission.  Tachycardic in A-fib but hemodynamically stable.  Blood work with elevated lipase, likely etiology of his pain.  Negative troponin.  Normal CBC and magnesium.  Mild hyponatremia is noted.  Urine without infectious features.  Awaiting CT imaging to assess for complications associated with acute pancreatitis but I anticipate he will require admission.  Diltiazem drip ongoing.  Signed out to oncoming provider pending the CT scan prior to admission      FINAL CLINICAL IMPRESSION(S) / ED DIAGNOSES   Final diagnoses:  Atrial fibrillation with RVR   Alcohol-induced acute pancreatitis, unspecified complication status     Rx / DC Orders   ED Discharge Orders     None        Note:  This document was prepared using Dragon voice recognition software and may include unintentional dictation errors.   Delton Prairie, MD 09/20/22 [phone]

## 2022-09-20 NOTE — ED Notes (Signed)
Meds are ready, but pt is sleeping. Allowing pt to sleep a little before waking him up for PO meds.

## 2022-09-20 NOTE — ED Notes (Signed)
Pt stood up to use urinal and became tachycardic (150-160). Pt denies dizziness, chest pain. PO meds were given.

## 2022-09-20 NOTE — H&P (Signed)
History and Physical    Darren Rogers WUJ:[id number] DOB: 07-18-1955 DOA: 09/20/2022  Referring MD/NP/PA:   PCP: Lyndon Code, MD   Patient coming from:  The patient is coming from home.    Chief Complaint: abdominal pain and heart racing  HPI: Darren Rogers is a 67 y.o. male with medical history significant of significant of polysubstance use (cocaine, alcohol, tobacco), recurrent alcoholic pancreatitis, A fib not on anticoagulants, HTN, HLD, depression, CAD, claudication, BPH, PVC, who presents with abdominal pain and heart racing.  Pt states that his abdominal pain has progressively worsening since last night, which is located in the epigastric area, constant, 8 out of 10 severity, sharp, nonradiating.  Denies nausea, vomiting and diarrhea.  No fever or chills.  He had some substernal chest pain earlier, which has resolved.  Currently chest pain-free.  He has heart racing, found to have atrial fibrillation with RVR, heart rate up to 170s, which has improved to 90-100s after giving 10 mg of Cardizem and started Cardizem drip in ED.  Denies cough or shortness breath.  No symptoms of UTI.  Data reviewed independently and ED Course: pt was found to have lipase 170, triglyceride level 126, trop  10 --> 11, WBC 4.2, liver function normal except for minimally elevated AST 45, GFR> 60, sodium 129, potassium 3.8, magnesium 1.9, phosphorus 3.4.  Temperature normal, blood pressure 114/78, RR 16, oxygen saturation 98% on room air.  Chest x-ray negative.  CT abdomen/pelvis is negative for acute intra-abdominal issues.  Patient is placed on PCU for observation.  CT abdomen/pelvis: 1. No acute findings are noted in the abdomen or pelvis to account for the patient's symptoms. Specifically, no definite imaging findings suggestive of acute pancreatitis confidently identified at this time. 2. Severe hepatic steatosis. 3. Extensive aortic atherosclerosis, including mild ectasia of the infrarenal abdominal  aorta (2.6 x 2.5 cm, in addition to chronic occlusive disease in the right hemipelvis, as detailed above, unchanged compared to the prior study. 4. Additional incidental findings, as above.    EKG: I have personally reviewed.  Atrial fibrillation, QTc 412, T wave inversion in V4-V6.   Review of Systems:   General: no fevers, chills, no body weight gain, has poor appetite, has fatigue HEENT: no blurry vision, hearing changes or sore throat Respiratory: no dyspnea, coughing, wheezing CV: has chest pain and palpitations GI: no nausea, vomiting, has abdominal pain, no diarrhea, constipation GU: no dysuria, burning on urination, increased urinary frequency, hematuria  Ext: no leg edema Neuro: no unilateral weakness, numbness, or tingling, no vision change or hearing loss Skin: no rash, no skin tear. MSK: No muscle spasm, no deformity, no limitation of range of movement in spin Heme: No easy bruising.  Travel history: No recent long distant travel.   Allergy: No Known Allergies  Past Medical History:  Diagnosis Date   Claudication    a. 06/2015 ABI: R - 0.73, L - 0.73. 30-49% bilat SFA stenosis. 50-74% R Profunda stenosis; b. 01/2017 ABI: R 0.89, L 0.88, TBI R 0.65, L 1.0.   Erectile dysfunction    Essential hypertension    History of echocardiogram    a. 03/29/2014: EF 55-60%, mild LVH, normal RVSP;  b. 05/2015 Echo: EF 60-65%, triv AI, mild MR; c. 03/2021 Echo: EF 55-60%, no rwma, mild LVH, nl RV fxn, mildly dil LA, mild MR.   Hyperlipidemia    Non-obstructive CAD    a. 01/27/2014 Cath: LM nl, LAD mild diff dz w/o obs, LCX no sig  obs - scattered 20-30% mLCx, RCA no obs dz, EF 70%; b. 06/2015 MV; EF 60%, no ischemia; c. 07/2020 MV: No ischemia/scar.   Persistent atrial fibrillation    a. Pt says Dx >88yrs ago w/ ? RFCA @ [facility];  b. Recurrent 12/2013;  b. Rx Flecainide and Xarelto-->Intermittent compliance; c. 03/2021 Zio: 100% Afib w/ avg rate of 99 bpm. Rare PVCs.   PVC's (premature  ventricular contractions)    a. 05/2017 Zio: 4000 PVCs in 48 hrs (2%). Brief run of SVT.   Tobacco abuse    a. ongoing - 1 ppd.    Past Surgical History:  Procedure Laterality Date   CARDIAC CATHETERIZATION     CARDIAC CATHETERIZATION     [facility]   LEFT HEART CATH Right 01/27/2014   Procedure: LEFT HEART CATH;  Surgeon: Micheline Chapman, MD;  Location: [facility] CATH LAB;  Service: Cardiovascular;  Laterality: Right;    Social History:  reports that he has been smoking cigarettes. He has a 15.00 pack-year smoking history. He has never used smokeless tobacco. He reports current alcohol use. He reports that he does not currently use drugs after having used the following drugs: Cocaine.  Family History:  Family History  Problem Relation Age of Onset   Coronary artery disease Father 52   Diabetes Mother    Diabetes Sister    Diabetes Brother    Diabetes Maternal Grandmother    Diabetes Sister    Diabetes Sister      Prior to Admission medications   Medication Sig Start Date End Date Taking? Authorizing Provider  aspirin EC 81 MG tablet Take 1 tablet (81 mg total) by mouth daily. Swallow whole. 05/24/22   Alford Highland, MD  atorvastatin (LIPITOR) 40 MG tablet Take 1 tablet (40 mg total) by mouth daily. Patient not taking: Reported on 08/06/2022 05/24/22   Alford Highland, MD  diltiazem (CARDIZEM CD) 180 MG 24 hr capsule Take 1 capsule (180 mg total) by mouth daily. 08/26/21   Creig Hines, NP  metoprolol tartrate (LOPRESSOR) 100 MG tablet Take 1 tablet (100 mg total) by mouth 2 (two) times daily. 08/26/21   Creig Hines, NP  Multiple Vitamin (MULTIVITAMIN WITH MINERALS) TABS tablet Take 1 tablet by mouth daily. 08/10/22   Enedina Finner, MD  oxyCODONE (ROXICODONE) 5 MG immediate release tablet Take 1 tablet (5 mg total) by mouth every 8 (eight) hours as needed for severe pain. 08/10/22   Enedina Finner, MD  pantoprazole (PROTONIX) 40 MG tablet Take 1 tablet (40 mg total) by  mouth daily. 08/26/21   Creig Hines, NP  polyethylene glycol (MIRALAX / GLYCOLAX) 17 g packet Take 17 g by mouth daily as needed. 05/24/22   Alford Highland, MD    Physical Exam: Vitals:   09/20/22 0911 09/20/22 0926 09/20/22 0930 09/20/22 1000  BP:   137/81 (!) 142/83  Pulse: (!) 124  (!) 38 71  Resp: (!) 26  (!) 22 16  Temp:  98.1 F (36.7 C)    TempSrc:  Oral    SpO2: 99%  97% 94%  Weight:      Height:       General: Not in acute distress HEENT:       Eyes: PERRL, EOMI, no scleral icterus.       ENT: No discharge from the ears and nose, no pharynx injection, no tonsillar enlargement.        Neck: No JVD, no bruit, no mass felt. Heme: No neck  lymph node enlargement. Cardiac: S1/S2, irregularly irregular rhythm, No murmurs, No gallops or rubs. Respiratory: No rales, wheezing, rhonchi or rubs. GI: Soft, nondistended, has tenderness in the upper abdomen, no rebound pain, no organomegaly, BS present. GU: No hematuria Ext: No pitting leg edema bilaterally. 1+DP/PT pulse bilaterally. Musculoskeletal: No joint deformities, No joint redness or warmth, no limitation of ROM in spin. Skin: No rashes.  Neuro: Alert, oriented X3, cranial nerves II-XII grossly intact, moves all extremities normally.  Psych: Patient is not psychotic, no suicidal or hemocidal ideation.  Labs on Admission: I have personally reviewed following labs and imaging studies  CBC: Recent Labs  Lab 09/20/22 0534  WBC 4.2  NEUTROABS 1.4*  HGB 14.2  HCT 41.2  MCV 92.0  PLT 206   Basic Metabolic Panel: Recent Labs  Lab 09/20/22 0534  NA 129*  K 3.8  CL 98  CO2 17*  GLUCOSE 93  BUN 6*  CREATININE 1.04  CALCIUM 8.9  MG 1.9  PHOS 3.4   GFR: Estimated Creatinine Clearance: 62.2 mL/min (by C-G formula based on SCr of 1.04 mg/dL). Liver Function Tests: Recent Labs  Lab 09/20/22 0534  AST 45*  ALT 29  ALKPHOS 81  BILITOT 0.8  PROT 7.0  ALBUMIN 3.7   Recent Labs  Lab  09/20/22 0534  LIPASE 170*   No results for input(s): "AMMONIA" in the last 168 hours. Coagulation Profile: No results for input(s): "INR", "PROTIME" in the last 168 hours. Cardiac Enzymes: No results for input(s): "CKTOTAL", "CKMB", "CKMBINDEX", "TROPONINI" in the last 168 hours. BNP (last 3 results) No results for input(s): "PROBNP" in the last 8760 hours. HbA1C: No results for input(s): "HGBA1C" in the last 72 hours. CBG: No results for input(s): "GLUCAP" in the last 168 hours. Lipid Profile: Recent Labs    09/20/22 0534  TRIG 126   Thyroid Function Tests: No results for input(s): "TSH", "T4TOTAL", "FREET4", "T3FREE", "THYROIDAB" in the last 72 hours. Anemia Panel: No results for input(s): "VITAMINB12", "FOLATE", "FERRITIN", "TIBC", "IRON", "RETICCTPCT" in the last 72 hours. Urine analysis:    Component Value Date/Time   COLORURINE STRAW (A) 09/20/2022 0534   APPEARANCEUR CLEAR (A) 09/20/2022 0534   APPEARANCEUR Clear 07/30/2016 1600   LABSPEC 1.002 (L) 09/20/2022 0534   LABSPEC 1.019 03/28/2014 1430   PHURINE 6.0 09/20/2022 0534   GLUCOSEU NEGATIVE 09/20/2022 0534   GLUCOSEU Negative 03/28/2014 1430   HGBUR NEGATIVE 09/20/2022 0534   BILIRUBINUR NEGATIVE 09/20/2022 0534   BILIRUBINUR Negative 07/30/2016 1600   BILIRUBINUR Negative 03/28/2014 1430   KETONESUR NEGATIVE 09/20/2022 0534   PROTEINUR NEGATIVE 09/20/2022 0534   NITRITE NEGATIVE 09/20/2022 0534   LEUKOCYTESUR NEGATIVE 09/20/2022 0534   LEUKOCYTESUR Negative 03/28/2014 1430   Sepsis Labs: (procalcitonin:4,lacticidven:4) )No results found for this or any previous visit (from the past 240 hour(s)).   Radiological Exams on Admission: CT ABDOMEN PELVIS W CONTRAST  Result Date: 09/20/2022 CLINICAL DATA:  67 year old male with history of upper abdominal pain. Suspected recurrent pancreatitis. EXAM: CT ABDOMEN AND PELVIS WITH CONTRAST TECHNIQUE: Multidetector CT imaging of the abdomen and pelvis was  performed using the standard protocol following bolus administration of intravenous contrast. RADIATION DOSE REDUCTION: This exam was performed according to the departmental dose-optimization program which includes automated exposure control, adjustment of the mA and/or kV according to patient size and/or use of iterative reconstruction technique. CONTRAST:  OMNIPAQUE IOHEXOL 300 MG/ML  SOLN COMPARISON:  CT of the abdomen and pelvis 08/06/2022. FINDINGS: Lower chest: Cardiomegaly. Atherosclerosis of  the descending thoracic aorta. Hepatobiliary: Low attenuation throughout the hepatic parenchyma, indicative of a background of severe hepatic steatosis. No suspicious cystic or solid hepatic lesions. No intra or extrahepatic biliary ductal dilatation. Gallbladder is normal in appearance. Pancreas: No pancreatic mass. No pancreatic ductal dilatation. No pancreatic or peripancreatic fluid collections or inflammatory changes. Spleen: Unremarkable. Adrenals/Urinary Tract: Bilateral kidneys and bilateral adrenal glands are normal in appearance. No hydroureteronephrosis. Urinary bladder is diffusely thickened and mildly trabeculated, similar to the prior study. Stomach/Bowel: The appearance of the stomach is normal. No pathologic dilatation of small bowel or colon. Normal appendix. Vascular/Lymphatic: Aortic atherosclerosis with fusiform ectasia of the infrarenal abdominal aorta which measures up to 2.5 x 2.6 cm shortly above the aortic bifurcation. 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 Reproductive: Prostate gland and seminal vesicles are unremarkable in appearance. Other: No significant volume of ascites.  No pneumoperitoneum. Musculoskeletal: There are no aggressive appearing lytic or blastic lesions noted in the visualized  portions of the skeleton. IMPRESSION: 1. No acute findings are noted in the abdomen or pelvis to account for the patient's symptoms. Specifically, no definite imaging findings suggestive of acute pancreatitis confidently identified at this time. 2. Severe hepatic steatosis. 3. 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 4. Additional incidental findings, as above. Electronically Signed   By: Trudie Reed M.D.   On: 09/20/2022 07:22   DG Chest Portable 1 View  Result Date: 09/20/2022 CLINICAL DATA:  67 year old male with history of atrial fibrillation with rapid ventricular rhythm. EXAM: PORTABLE CHEST 1 VIEW COMPARISON:  Chest x-ray 08/06/2022. FINDINGS: Lung volumes are normal. No acute consolidative airspace disease. No pleural effusions. No pneumothorax. No evidence of pulmonary edema. Mild cardiomegaly (unchanged). Upper mediastinal contours are within normal limits. IMPRESSION: 1. No radiographic evidence of acute cardiopulmonary disease. 2. Mild cardiomegaly. Electronically Signed   By: Trudie Reed M.D.   On: 09/20/2022 06:08      Assessment/Plan Principal Problem:   Atrial fibrillation with RVR Active Problems:   Alcoholic pancreatitis   CAD (coronary artery disease)   Alcohol abuse   Essential hypertension   Hyponatremia   Tobacco abuse   Cocaine abuse   Polysubstance abuse   Assessment and Plan:  A-fib with RVR: Hr is up to 170s, improving after started Cardizem drip.  Currently heart rate 90-100.  Patient is not taking anticoagulants possibly due to alcohol abuse and high risk of fall  -place in PCU for observation -Continue Cardizem drip -Continue home Cardizem and metoprolol  Alcoholic pancreatitis: Lipase is 170, most likely due to alcohol abuse.  TG level 126.  -NPO for  pancreatitis -IVF: 1L NS and then at 125 cc/hr -prn IV percocet and tylenol for pain control -prn IV zofran for nausea   CAD (coronary artery disease): Patient is not taking Lipitor, refused to restart Lipitor. -ASA    Essential hypertension -IV hydralazine as needed -Cardizem, metoprolol  Hyponatremia: Sodium 129.  Mental status normal.  This is most likely due to alcohol abuse -Normal saline as above -Sodium chloride tablet 1 g twice daily   Polysubstance abuse (HCC), Tobacco abuse, Alcohol abuse, Cocaine abuse (HCC): -did counseling about importance of quitting substance  use.  I spent lengthy time with pt to have talked with patient about this issue.   -Nicotine patch -CIWA protocol   GERD: -protonix    DVT ppx:  SQ Lovenox  Code Status: Full code  Family Communication: not done, no family member is at bed side.              Yes, patient's    at bed side.       by phone  Disposition Plan:  Anticipate discharge back to previous environment  Consults called:  none  Admission status and Level of care: Progressive:   as inpt       Dispo: The patient is from: Home              Anticipated d/c is to: Home              Anticipated d/c date is: 1 day              Patient currently is not medically stable to d/c.    Severity of Illness:  The appropriate patient status for this patient is OBSERVATION. Observation status is judged to be reasonable and necessary in order to provide the required intensity of service to ensure the patient's safety. The patient's presenting symptoms, physical exam findings, and initial radiographic and laboratory data in the context of their medical condition is felt to place them at decreased risk for further clinical deterioration. Furthermore, it is anticipated that the patient will be medically stable for discharge from the hospital within 2 midnights of admission.        Date of Service 09/20/2022    Lorretta Harp [facility]   If 7PM-7AM, please contact night-coverage [email or web address] 09/20/2022, 10:18 AM

## 2022-09-20 NOTE — ED Notes (Signed)
Pt requesting IV morphine for pancreatitis pain. Messaged Dr Clyde Lundborg.

## 2022-09-20 NOTE — ED Notes (Signed)
Pt gone to CT 

## 2022-09-20 NOTE — ED Notes (Signed)
Dr Niu at bedside 

## 2022-09-21 ENCOUNTER — Ambulatory Visit: Payer: 59 | Admitting: Nurse Practitioner

## 2022-09-21 ENCOUNTER — Other Ambulatory Visit: Payer: Self-pay

## 2022-09-21 DIAGNOSIS — I4891 Unspecified atrial fibrillation: Secondary | ICD-10-CM | POA: Diagnosis present

## 2022-09-21 DIAGNOSIS — Z833 Family history of diabetes mellitus: Secondary | ICD-10-CM | POA: Diagnosis not present

## 2022-09-21 DIAGNOSIS — Z79899 Other long term (current) drug therapy: Secondary | ICD-10-CM | POA: Diagnosis not present

## 2022-09-21 DIAGNOSIS — I482 Chronic atrial fibrillation, unspecified: Secondary | ICD-10-CM | POA: Diagnosis present

## 2022-09-21 DIAGNOSIS — F1010 Alcohol abuse, uncomplicated: Secondary | ICD-10-CM | POA: Diagnosis present

## 2022-09-21 DIAGNOSIS — E871 Hypo-osmolality and hyponatremia: Secondary | ICD-10-CM | POA: Diagnosis present

## 2022-09-21 DIAGNOSIS — K852 Alcohol induced acute pancreatitis without necrosis or infection: Secondary | ICD-10-CM | POA: Diagnosis present

## 2022-09-21 DIAGNOSIS — E785 Hyperlipidemia, unspecified: Secondary | ICD-10-CM | POA: Diagnosis present

## 2022-09-21 DIAGNOSIS — F1410 Cocaine abuse, uncomplicated: Secondary | ICD-10-CM | POA: Diagnosis present

## 2022-09-21 DIAGNOSIS — K219 Gastro-esophageal reflux disease without esophagitis: Secondary | ICD-10-CM | POA: Diagnosis present

## 2022-09-21 DIAGNOSIS — F17210 Nicotine dependence, cigarettes, uncomplicated: Secondary | ICD-10-CM | POA: Diagnosis present

## 2022-09-21 DIAGNOSIS — I10 Essential (primary) hypertension: Secondary | ICD-10-CM | POA: Diagnosis present

## 2022-09-21 DIAGNOSIS — Z8249 Family history of ischemic heart disease and other diseases of the circulatory system: Secondary | ICD-10-CM | POA: Diagnosis not present

## 2022-09-21 DIAGNOSIS — I2510 Atherosclerotic heart disease of native coronary artery without angina pectoris: Secondary | ICD-10-CM | POA: Diagnosis present

## 2022-09-21 DIAGNOSIS — K760 Fatty (change of) liver, not elsewhere classified: Secondary | ICD-10-CM | POA: Diagnosis present

## 2022-09-21 DIAGNOSIS — Z7982 Long term (current) use of aspirin: Secondary | ICD-10-CM | POA: Diagnosis not present

## 2022-09-21 LAB — GLUCOSE, CAPILLARY
Glucose-Capillary: 69 mg/dL — ABNORMAL LOW (ref 70–99)
Glucose-Capillary: 140 mg/dL — ABNORMAL HIGH (ref 70–99)
Glucose-Capillary: 86 mg/dL (ref 70–99)

## 2022-09-21 LAB — BASIC METABOLIC PANEL
Anion gap: 11 (ref 5–15)
BUN: 8 mg/dL (ref 8–23)
CO2: 22 mmol/L (ref 22–32)
Calcium: 8.8 mg/dL — ABNORMAL LOW (ref 8.9–10.3)
Chloride: 100 mmol/L (ref 98–111)
Creatinine, Ser: 0.79 mg/dL (ref 0.61–1.24)
GFR, Estimated: >60 mL/min (ref >60)
Glucose, Bld: 107 mg/dL — ABNORMAL HIGH (ref 70–99)
Potassium: 4.1 mmol/L (ref 3.5–5.1)
Sodium: 133 mmol/L — ABNORMAL LOW (ref 135–145)

## 2022-09-21 LAB — PHOSPHORUS: Phosphorus: 3 mg/dL (ref 2.5–4.6)

## 2022-09-21 LAB — CBC
HCT: 39.7 % (ref 39.0–52.0)
Hemoglobin: 13.7 g/dL (ref 13.0–17.0)
MCH: 31.3 pg (ref 26.0–34.0)
MCHC: 34.5 g/dL (ref 30.0–36.0)
MCV: 90.6 fL (ref 80.0–100.0)
Platelets: 202 10*3/uL (ref 150–400)
RBC: 4.38 MIL/uL (ref 4.22–5.81)
RDW: 15.5 % (ref 11.5–15.5)
WBC: 4.2 10*3/uL (ref 4.0–10.5)
nRBC: 0 % (ref 0.0–0.2)

## 2022-09-21 LAB — LIPASE, BLOOD: Lipase: 114 U/L — ABNORMAL HIGH (ref 11–51)

## 2022-09-21 LAB — MAGNESIUM: Magnesium: 1.8 mg/dL (ref 1.7–2.4)

## 2022-09-21 MED ORDER — DILTIAZEM HCL ER COATED BEADS 180 MG PO CP24
180.0000 mg | ORAL_CAPSULE | Freq: Every day | ORAL | Status: DC
  Administered 2022-09-22 – 2022-09-24 (×3): 180 mg via ORAL
  Filled 2022-09-21 (×3): qty 1

## 2022-09-21 MED ORDER — ORAL CARE MOUTH RINSE: 15.0000 mL | OROMUCOSAL | Status: DC | PRN

## 2022-09-21 MED ORDER — DEXTROSE 50 % IV SOLN
12.5000 g | INTRAVENOUS | Status: AC
  Administered 2022-09-21: 12.5 g via INTRAVENOUS
  Filled 2022-09-21: qty 50

## 2022-09-21 NOTE — Progress Notes (Signed)
[facility] Progress Note  Patient: Darren Rogers    ZOX:[id number]  DOA: 09/20/2022     Date of Service: the patient was seen and examined on 09/21/2022  Chief Complaint  Patient presents with   Chest Pain   Brief hospital course: Darren Rogers is a 67 y.o. male with medical history significant of significant of polysubstance use (cocaine, alcohol, tobacco), recurrent alcoholic pancreatitis, A fib not on anticoagulants, HTN, HLD, depression, CAD, claudication, BPH, PVC, who presents with abdominal pain and heart racing.  Patient was found to have A-fib with RVR and acute pancreatitis. Patient was on Cardizem IV infusion and patient was admitted for further management as below.   Assessment and Plan:  # A-fib with RVR,  CHA2DS2VASc score 2 ( Age and HTN), patient is not on anticoagulation at home Continue telemetry Cardizem IV infusion continue, wean off gradually Continue Cardizem CD 180 mg pod and Lopressor 100 mg p.o. twice daily home dose Monitor BP and heart rate and titrate medications accordingly  # Acute pancreatitis Lipase 170--114 Triglyceride 126 Continue IV fluid for hydration Started clear liquid diet   # CAD (coronary artery disease): Patient is not taking Lipitor, refused to restart Lipitor. -ASA    Essential hypertension -IV hydralazine as needed -Cardizem, metoprolol   Hyponatremia: Sodium 129.  Mental status normal.  This is most likely due to alcohol abuse -Normal saline as above -Sodium chloride tablet 1 g twice daily   Polysubstance abuse (HCC), Tobacco abuse, Alcohol abuse, Cocaine abuse (HCC): -did counseling about importance of quitting substance use.  I spent lengthy time with pt to have talked with patient about this issue.   -Nicotine patch -CIWA protocol    GERD: -protonix   Body mass index is 25.02 kg/m.  Interventions:       Diet: CLD DVT Prophylaxis: Subcutaneous Lovenox   Advance goals of care discussion: Full  code  Family Communication: family was not present at bedside, at the time of interview.  The pt provided permission to discuss medical plan with the family. Opportunity was given to ask question and all questions were answered satisfactorily.   Disposition:  Pt is from home, admitted with A-fib with RVR and acute pancreatitis, still has significant abdominal pain on clear liquid diet, which precludes a safe discharge. Discharge to home, when clinically stable, may need few days to improve.  Subjective: No significant events overnight, palpitations resolved, patient denies any chest pain, patient still has significant abdominal pain and tenderness.  Denies any other active issues.  Physical Exam: General: NAD, lying comfortably Appear in no distress, affect appropriate Eyes: PERRLA ENT: Oral Mucosa Clear, moist  Neck: no JVD,  Cardiovascular: Irregular rhythm, no Murmur,  Respiratory: good respiratory effort, Bilateral Air entry equal and Decreased, no Crackles, no wheezes Abdomen: Bowel Sound present, soft, obese and generalized tenderness  Skin: no rashes Extremities: no Pedal edema, no calf tenderness Neurologic: without any new focal findings Gait not checked due to patient safety concerns  Vitals:   09/21/22 0504 09/21/22 0740 09/21/22 0900 09/21/22 1255  BP: (!) 154/102 (!) 151/93 (!) 138/94 (!) 145/98  Pulse: 66 (!) 56 85 (!) 58  Resp:  16  16  Temp:  97.7 F (36.5 C)  (!) 97.5 F (36.4 C)  TempSrc:  Oral  Oral  SpO2:  97%  99%  Weight:      Height:        Intake/Output Summary (Last 24 hours) at 09/21/2022 1607 Last data filed at 09/21/2022  1553 Gross per 24 hour  Intake 2224.32 ml  Output 900 ml  Net 1324.32 ml   Filed Weights   09/20/22 0528  Weight: 70.3 kg    Data Reviewed: I have personally reviewed and interpreted daily labs, tele strips, imagings as discussed above. I reviewed all nursing notes, pharmacy notes, vitals, pertinent old records I have  discussed plan of care as described above with RN and patient/family.  CBC: Recent Labs  Lab 09/20/22 0534 09/21/22 0549  WBC 4.2 4.2  NEUTROABS 1.4*  --   HGB 14.2 13.7  HCT 41.2 39.7  MCV 92.0 90.6  PLT 206 202   Basic Metabolic Panel: Recent Labs  Lab 09/20/22 0534 09/21/22 0549  NA 129* 133*  K 3.8 4.1  CL 98 100  CO2 17* 22  GLUCOSE 93 107*  BUN 6* 8  CREATININE 1.04 0.79  CALCIUM 8.9 8.8*  MG 1.9 1.8  PHOS 3.4 3.0    Studies: No results found.  Scheduled Meds:  aspirin EC  81 mg Oral Daily   [START ON 09/22/2022] diltiazem  180 mg Oral Daily   enoxaparin (LOVENOX) injection  40 mg Subcutaneous Q24H   folic acid  1 mg Oral Daily   LORazepam  0-4 mg Intravenous Q6H   Followed by   Melene Muller ON 09/22/2022] LORazepam  0-4 mg Intravenous Q12H   metoprolol tartrate  100 mg Oral BID   multivitamin with minerals  1 tablet Oral Daily   nicotine  21 mg Transdermal Daily   pantoprazole  40 mg Oral Daily   sodium chloride  1 g Oral BID WC   thiamine  100 mg Oral Daily   Or   thiamine  100 mg Intravenous Daily   Continuous Infusions:  sodium chloride 125 mL/hr at 09/21/22 0911   diltiazem (CARDIZEM) infusion 5 mg/hr (09/21/22 0042)   PRN Meds: acetaminophen, hydrALAZINE, HYDROmorphone (DILAUDID) injection, LORazepam **OR** LORazepam, ondansetron (ZOFRAN) IV, mouth rinse, oxyCODONE-acetaminophen, polyethylene glycol  Time spent: 35 minutes  Author: Gillis Santa. MD [facility] 09/21/2022 4:07 PM  To reach On-call, see care teams to locate the attending and reach out to them via [email or web address]. If 7PM-7AM, please contact night-coverage If you still have difficulty reaching the attending provider, please page the [facility] (Director on Call) for [facility] on amion for assistance.

## 2022-09-21 NOTE — Progress Notes (Addendum)
  Transition of Care [facility]) Screening Note   Patient Details  Name: Darren Rogers Date of Birth: 1955-09-07   [facility]) CM/SW Contact:    Truddie Hidden, RN Phone Number: 09/21/2022, 9:12 AM    Substance abuse resources added to AVS. Patient stated he had an appointment at [facility] today. Patient request RNCM to make MD office aware of his appointment. Spoke with Archie Patten at [facility]. She confirmed patient missed his appointment at 8am. He can call and rescheule at discharge.

## 2022-09-21 NOTE — Discharge Instructions (Signed)
                  Intensive Outpatient Programs  [facility]    [facility] [address]     [address] [place],  [place]     [place], [place] [phone]      [phone]  [facility]   [facility]  (Inpatient and outpatient)  [phone] (Suboxone and Methadone) [address]           [phone]           [facility]: [facility]    [facility] - Intensive Outpatient [address]     [address] [place], [place] [address]     [place], [place]  [phone]      [phone]  [facility] (Outpatient, Inpatient, Chemical  [facility] (Groups and [facility]) (insurance only) [phone]    [place], [place]          [phone]       [facility]    [facility] (for caregivers and family) [address]     [address] [place], [place]     [place], [place] [phone]      [phone]  Residential Treatment Programs  [facility]  [facility](2 years) Residential: 90 days)  [facility] [address]      [address] [place], [place]     [place], [place] [phone]      [phone] or [phone]  [facility]    [facility] [address]      [address] [place], [place]     [place], [place] [phone]      [phone]  [facility]   [facility] ([facility]) [address]     [address] [place], [place] [address]     [place], [place] [phone]      [phone] Admissions: 8am-3pm M-F  [facility]: Residential Program (90 Days)              [facility]: [facility]  [place], [place]     [place], [place]  [phone] or [phone]    (Walk in Hours over the weekend or by referral)   [facility]: [facility]:[phone] (for crisis  response 24 hours a day) 

## 2022-09-21 NOTE — Progress Notes (Signed)
Hypoglycemic Event  CBG: 69  Treatment: D50 25 mL (12.5 gm)  Symptoms: None  Follow-up CBG: Time: 0508 CBG Result:140  Possible Reasons for Event: Inadequate meal intake  Comments/MD notified: Patient currently NPO.    Darren Rogers

## 2022-09-22 DIAGNOSIS — I4891 Unspecified atrial fibrillation: Secondary | ICD-10-CM | POA: Diagnosis not present

## 2022-09-22 LAB — CBC
HCT: 39 % (ref 39.0–52.0)
Hemoglobin: 13.4 g/dL (ref 13.0–17.0)
MCH: 31.5 pg (ref 26.0–34.0)
MCHC: 34.4 g/dL (ref 30.0–36.0)
MCV: 91.5 fL (ref 80.0–100.0)
Platelets: 180 10*3/uL (ref 150–400)
RBC: 4.26 MIL/uL (ref 4.22–5.81)
RDW: 15.3 % (ref 11.5–15.5)
WBC: 4.4 10*3/uL (ref 4.0–10.5)
nRBC: 0 % (ref 0.0–0.2)

## 2022-09-22 LAB — AMMONIA: Ammonia: 27 umol/L (ref 9–35)

## 2022-09-22 LAB — BASIC METABOLIC PANEL
Anion gap: 9 (ref 5–15)
BUN: 6 mg/dL — ABNORMAL LOW (ref 8–23)
CO2: 22 mmol/L (ref 22–32)
Calcium: 8.8 mg/dL — ABNORMAL LOW (ref 8.9–10.3)
Chloride: 100 mmol/L (ref 98–111)
Creatinine, Ser: 0.74 mg/dL (ref 0.61–1.24)
GFR, Estimated: >60 mL/min (ref >60)
Glucose, Bld: 89 mg/dL (ref 70–99)
Potassium: 3.8 mmol/L (ref 3.5–5.1)
Sodium: 131 mmol/L — ABNORMAL LOW (ref 135–145)

## 2022-09-22 LAB — GLUCOSE, CAPILLARY: Glucose-Capillary: 85 mg/dL (ref 70–99)

## 2022-09-22 LAB — HEPATIC FUNCTION PANEL
ALT: 26 U/L (ref 0–44)
AST: 33 U/L (ref 15–41)
Albumin: 3.5 g/dL (ref 3.5–5.0)
Alkaline Phosphatase: 79 U/L (ref 38–126)
Bilirubin, Direct: 0.2 mg/dL (ref 0.0–0.2)
Indirect Bilirubin: 0.9 mg/dL (ref 0.3–0.9)
Total Bilirubin: 1.1 mg/dL (ref 0.3–1.2)
Total Protein: 6.8 g/dL (ref 6.5–8.1)

## 2022-09-22 LAB — PHOSPHORUS: Phosphorus: 3.1 mg/dL (ref 2.5–4.6)

## 2022-09-22 LAB — MAGNESIUM: Magnesium: 1.8 mg/dL (ref 1.7–2.4)

## 2022-09-22 LAB — LIPASE, BLOOD: Lipase: 94 U/L — ABNORMAL HIGH (ref 11–51)

## 2022-09-22 LAB — OSMOLALITY: Osmolality: 278 mOsm/kg (ref 275–295)

## 2022-09-22 MED ORDER — APIXABAN 5 MG PO TABS
5.0000 mg | ORAL_TABLET | Freq: Two times a day (BID) | ORAL | Status: DC
  Administered 2022-09-23 – 2022-09-24 (×3): 5 mg via ORAL
  Filled 2022-09-22 (×3): qty 1

## 2022-09-22 MED ORDER — ENSURE ENLIVE PO LIQD
237.0000 mL | Freq: Three times a day (TID) | ORAL | Status: DC
  Administered 2022-09-22 – 2022-09-24 (×5): 237 mL via ORAL

## 2022-09-22 NOTE — Progress Notes (Signed)
[facility] Progress Note  Patient: Darren Rogers    ZOX:[id number]  DOA: 09/20/2022     Date of Service: the patient was seen and examined on 09/22/2022  Chief Complaint  Patient presents with   Chest Pain   Brief hospital course: Festus Pursel is a 67 y.o. male with medical history significant of significant of polysubstance use (cocaine, alcohol, tobacco), recurrent alcoholic pancreatitis, A fib not on anticoagulants, HTN, HLD, depression, CAD, claudication, BPH, PVC, who presents with abdominal pain and heart racing.  Patient was found to have A-fib with RVR and acute pancreatitis. Patient was on Cardizem IV infusion and patient was admitted for further management as below.   Assessment and Plan:  # A-fib with RVR,  CHA2DS2VASc score 2 ( Age and HTN), patient is not on anticoagulation at home Start Eliquis 5 mg p.o. twice daily from 09/23/2022, patient agreed to start DOAC, patient had nosebleed 5 years ago so he stopped taking Eliquis and he was placed on aspirin but patient is willing to try again. Continue telemetry Cardizem IV infusion continue, wean off gradually Continue Cardizem CD 180 mg pod and Lopressor 100 mg p.o. twice daily home dose Monitor BP and heart rate and titrate medications accordingly  # Acute pancreatitis Lipase 170--114--94 Triglyceride 126 Continue IV fluid for hydration Continue clear liquid diet, still has significant pain, unable to advance diet   # CAD (coronary artery disease): Patient is not taking Lipitor, refused to restart Lipitor. Discontinued aspirin, started Eliquis as above   Essential hypertension -IV hydralazine as needed -Cardizem, metoprolol   Hyponatremia: Sodium 129.  Mental status normal.  This is most likely due to alcohol abuse -Normal saline as above -Sodium chloride tablet 1 g twice daily   Polysubstance abuse (HCC), Tobacco abuse, Alcohol abuse, Cocaine abuse (HCC): -did counseling about importance of quitting  substance use.  I spent lengthy time with pt to have talked with patient about this issue.   -Nicotine patch -CIWA protocol    GERD: -protonix   Body mass index is 25.02 kg/m.  Interventions:     Diet: CLD DVT Prophylaxis: Therapeutic Anticoagulation with Eliquis    Advance goals of care discussion: Full code  Family Communication: family was not present at bedside, at the time of interview.  The pt provided permission to discuss medical plan with the family. Opportunity was given to ask question and all questions were answered satisfactorily.   Disposition:  Pt is from home, admitted with A-fib with RVR and acute pancreatitis, still has significant abdominal pain on clear liquid diet, which precludes a safe discharge. Discharge to home, when clinically stable, may need few days to improve.  Subjective: No significant events overnight, patient still complaining of abdominal pain but overall feels improvement, patient is trying to eat clear liquid but not ready to advance diet because it hurts more when he tries to eat more.  Patient was advised to go slow with the diet and give rest to the pancreas.   Physical Exam: General: NAD, lying comfortably Appear in no distress, affect appropriate Eyes: PERRLA ENT: Oral Mucosa Clear, moist  Neck: no JVD,  Cardiovascular: Irregular rhythm, no Murmur,  Respiratory: good respiratory effort, Bilateral Air entry equal and Decreased, no Crackles, no wheezes Abdomen: Bowel Sound present, soft, obese and generalized tenderness  Skin: no rashes Extremities: no Pedal edema, no calf tenderness Neurologic: without any new focal findings Gait not checked due to patient safety concerns  Vitals:   09/22/22 0757 09/22/22 0758 09/22/22  0759 09/22/22 1215  BP:    139/85  Pulse:    81  Resp: 20 (!) Temp:    97.8 F (36.6 C)  TempSrc:    Oral  SpO2:    100%  Weight:      Height:        Intake/Output Summary (Last 24 hours) at  09/22/2022 1542 Last data filed at 09/22/2022 1424 Gross per 24 hour  Intake 4525.78 ml  Output 2975 ml  Net 1550.78 ml   Filed Weights   09/20/22 0528  Weight: 70.3 kg    Data Reviewed: I have personally reviewed and interpreted daily labs, tele strips, imagings as discussed above. I reviewed all nursing notes, pharmacy notes, vitals, pertinent old records I have discussed plan of care as described above with RN and patient/family.  CBC: Recent Labs  Lab 09/20/22 0534 09/21/22 0549 09/22/22 0555  WBC 4.2 4.2 4.4  NEUTROABS 1.4*  --   --   HGB 14.2 13.7 13.4  HCT 41.2 39.7 39.0  MCV 92.0 90.6 91.5  PLT 206 202 180   Basic Metabolic Panel: Recent Labs  Lab 09/20/22 0534 09/21/22 0549 09/22/22 0555  NA 129* 133* 131*  K 3.8 4.1 3.8  CL 98 100 100  CO2 17* 22 22  GLUCOSE 93 107* 89  BUN 6* 8 6*  CREATININE 1.04 0.79 0.74  CALCIUM 8.9 8.8* 8.8*  MG 1.9 1.8 1.8  PHOS 3.4 3.0 3.1    Studies: No results found.  Scheduled Meds:  aspirin EC  81 mg Oral Daily   diltiazem  180 mg Oral Daily   enoxaparin (LOVENOX) injection  40 mg Subcutaneous Q24H   folic acid  1 mg Oral Daily   LORazepam  0-4 mg Intravenous Q12H   metoprolol tartrate  100 mg Oral BID   multivitamin with minerals  1 tablet Oral Daily   nicotine  21 mg Transdermal Daily   pantoprazole  40 mg Oral Daily   sodium chloride  1 g Oral BID WC   thiamine  100 mg Oral Daily   Or   thiamine  100 mg Intravenous Daily   Continuous Infusions:  sodium chloride 20 mL/hr at 09/22/22 1028   diltiazem (CARDIZEM) infusion 5 mg/hr (09/21/22 2209)   PRN Meds: acetaminophen, hydrALAZINE, HYDROmorphone (DILAUDID) injection, LORazepam **OR** LORazepam, ondansetron (ZOFRAN) IV, mouth rinse, oxyCODONE-acetaminophen, polyethylene glycol  Time spent: 35 minutes  Author: Gillis Santa. MD [facility] 09/22/2022 3:42 PM  To reach On-call, see care teams to locate the attending and reach out to them via  [email or web address]. If 7PM-7AM, please contact night-coverage If you still have difficulty reaching the attending provider, please page the [facility] (Director on Call) for [facility] on amion for assistance.

## 2022-09-23 ENCOUNTER — Other Ambulatory Visit (HOSPITAL_COMMUNITY): Payer: Self-pay

## 2022-09-23 DIAGNOSIS — I4891 Unspecified atrial fibrillation: Secondary | ICD-10-CM | POA: Diagnosis not present

## 2022-09-23 LAB — GLUCOSE, CAPILLARY: Glucose-Capillary: 93 mg/dL (ref 70–99)

## 2022-09-23 LAB — CBC
HCT: 37.7 % — ABNORMAL LOW (ref 39.0–52.0)
Hemoglobin: 12.8 g/dL — ABNORMAL LOW (ref 13.0–17.0)
MCH: 31.5 pg (ref 26.0–34.0)
MCHC: 34 g/dL (ref 30.0–36.0)
MCV: 92.9 fL (ref 80.0–100.0)
Platelets: 173 10*3/uL (ref 150–400)
RBC: 4.06 MIL/uL — ABNORMAL LOW (ref 4.22–5.81)
RDW: 15.5 % (ref 11.5–15.5)
WBC: 4 10*3/uL (ref 4.0–10.5)
nRBC: 0 % (ref 0.0–0.2)

## 2022-09-23 LAB — BASIC METABOLIC PANEL
Anion gap: 9 (ref 5–15)
BUN: 7 mg/dL — ABNORMAL LOW (ref 8–23)
CO2: 21 mmol/L — ABNORMAL LOW (ref 22–32)
Calcium: 8.7 mg/dL — ABNORMAL LOW (ref 8.9–10.3)
Chloride: 104 mmol/L (ref 98–111)
Creatinine, Ser: 0.8 mg/dL (ref 0.61–1.24)
GFR, Estimated: >60 mL/min (ref >60)
Glucose, Bld: 91 mg/dL (ref 70–99)
Potassium: 3.4 mmol/L — ABNORMAL LOW (ref 3.5–5.1)
Sodium: 134 mmol/L — ABNORMAL LOW (ref 135–145)

## 2022-09-23 LAB — MAGNESIUM: Magnesium: 1.8 mg/dL (ref 1.7–2.4)

## 2022-09-23 LAB — PHOSPHORUS: Phosphorus: 3.4 mg/dL (ref 2.5–4.6)

## 2022-09-23 LAB — LIPASE, BLOOD: Lipase: 96 U/L — ABNORMAL HIGH (ref 11–51)

## 2022-09-23 LAB — AMMONIA: Ammonia: 22 umol/L (ref 9–35)

## 2022-09-23 MED ORDER — POTASSIUM CHLORIDE CRYS ER 20 MEQ PO TBCR
40.0000 meq | EXTENDED_RELEASE_TABLET | Freq: Once | ORAL | Status: AC
  Administered 2022-09-23: 40 meq via ORAL
  Filled 2022-09-23: qty 2

## 2022-09-23 NOTE — Progress Notes (Signed)
[facility] Progress Note  Patient: Darren Rogers    WUJ:[id number]  DOA: 09/20/2022     Date of Service: the patient was seen and examined on 09/23/2022  Chief Complaint  Patient presents with   Chest Pain   Brief hospital course: Darren Rogers is a 67 y.o. male with medical history significant of significant of polysubstance use (cocaine, alcohol, tobacco), recurrent alcoholic pancreatitis, A fib not on anticoagulants, HTN, HLD, depression, CAD, claudication, BPH, PVC, who presents with abdominal pain and heart racing.  Patient was found to have A-fib with RVR and acute pancreatitis. Patient was on Cardizem IV infusion and patient was admitted for further management as below.   Assessment and Plan:  # A-fib with RVR,  CHA2DS2VASc score 2 ( Age and HTN), patient is not on anticoagulation at home Start Eliquis 5 mg p.o. twice daily from 09/23/2022, patient agreed to start DOAC, patient had nosebleed 5 years ago so he stopped taking Eliquis and he was placed on aspirin but patient is willing to try again. Continue telemetry S/p IV infusion, weaned off gradually Continue Cardizem CD 180 mg pod and Lopressor 100 mg p.o. twice daily home dose Monitor BP and heart rate and titrate medications accordingly 4/24 started Eliquis 5 mg p.o. twice daily  # Acute pancreatitis Lipase 170--114--94--96  Triglyceride 126 Decreased IVF 75 mill per hour  S/p clear liquid diet,  4/24 abdominal pain resolved, patient is requesting to advance regular diet. So diet advanced to soft diet, we will continue to monitor today for tolerance and plan for discharge tomorrow a.m.  # CAD (coronary artery disease): Patient is not taking Lipitor, refused to restart Lipitor. Discontinued aspirin, started Eliquis as above   Essential hypertension -IV hydralazine as needed -Cardizem, metoprolol   Hyponatremia: Sodium 129.  Mental status normal.  This is most likely due to alcohol abuse Serum osmolality 278  within normal range -Normal saline as above -Sodium chloride tablet 1 g twice daily   Polysubstance abuse (HCC), Tobacco abuse, Alcohol abuse, Cocaine abuse (HCC): -did counseling about importance of quitting substance use.  I spent lengthy time with pt to have talked with patient about this issue.   -Nicotine patch -CIWA protocol    GERD: -protonix   Body mass index is 25.02 kg/m.  Interventions:     Diet: Soft diet DVT Prophylaxis: Therapeutic Anticoagulation with Eliquis    Advance goals of care discussion: Full code  Family Communication: family was not present at bedside, at the time of interview.  The pt provided permission to discuss medical plan with the family. Opportunity was given to ask question and all questions were answered satisfactorily.   Disposition:  Pt is from home, admitted with A-fib with RVR and acute pancreatitis, abdominal pain resolved today, started soft diet, gradually improving.  Plan for discharge tomorrow a.m.  Discharge to home, when clinically stable, most likely tomorrow a.m.   Subjective: No significant events overnight, patient stated that abdominal pain has resolved, and patient is hungry and would like to try regular food.  Patient was advised to start with full liquid diet but he wanted to go directly to the solid food so started on soft diet.  We will monitor tolerance and plan for discharge tomorrow a.m.  Denied any other complaints.   Physical Exam: General: NAD, lying comfortably Appear in no distress, affect appropriate Eyes: PERRLA ENT: Oral Mucosa Clear, moist  Neck: no JVD,  Cardiovascular: Irregular rhythm, no Murmur,  Respiratory: good respiratory effort, Bilateral Air  entry equal and Decreased, no Crackles, no wheezes Abdomen: Bowel Sound present, soft, obese and mild tenderness  Skin: no rashes Extremities: no Pedal edema, no calf tenderness Neurologic: without any new focal findings Gait not checked due to patient  safety concerns  Vitals:   09/23/22 0446 09/23/22 0818 09/23/22 1215 09/23/22 1622  BP: (!) 148/96 (!) 145/96 (!) 139/99 123/86  Pulse: 73 95 81 65  Resp: Temp: 97.9 F (36.6 C) 97.8 F (36.6 C) 97.7 F (36.5 C) 98.2 F (36.8 C)  TempSrc: Oral  Oral   SpO2: 98% 100% 96% 100%  Weight:      Height:        Intake/Output Summary (Last 24 hours) at 09/23/2022 1630 Last data filed at 09/23/2022 0950 Gross per 24 hour  Intake 865.38 ml  Output 1650 ml  Net -784.62 ml   Filed Weights   09/20/22 0528  Weight: 70.3 kg    Data Reviewed: I have personally reviewed and interpreted daily labs, tele strips, imagings as discussed above. I reviewed all nursing notes, pharmacy notes, vitals, pertinent old records I have discussed plan of care as described above with RN and patient/family.  CBC: Recent Labs  Lab 09/20/22 0534 09/21/22 0549 09/22/22 0555 09/23/22 0600  WBC 4.2 4.2 4.4 4.0  NEUTROABS 1.4*  --   --   --   HGB 14.2 13.7 13.4 12.8*  HCT 41.2 39.7 39.0 37.7*  MCV 92.0 90.6 91.5 92.9  PLT 206 202 180 173   Basic Metabolic Panel: Recent Labs  Lab 09/20/22 0534 09/21/22 0549 09/22/22 0555 09/23/22 0600  NA 129* 133* 131* 134*  K 3.8 4.1 3.8 3.4*  CL 98 100 100 104  CO2 17* 22 22 21*  GLUCOSE 93 107* 89 91  BUN 6* 8 6* 7*  CREATININE 1.04 0.79 0.74 0.80  CALCIUM 8.9 8.8* 8.8* 8.7*  MG 1.9 1.8 1.8 1.8  PHOS 3.4 3.0 3.1 3.4    Studies: No results found.  Scheduled Meds:  apixaban  5 mg Oral BID   diltiazem  180 mg Oral Daily   feeding supplement  237 mL Oral TID BM   folic acid  1 mg Oral Daily   LORazepam  0-4 mg Intravenous Q12H   metoprolol tartrate  100 mg Oral BID   multivitamin with minerals  1 tablet Oral Daily   nicotine  21 mg Transdermal Daily   pantoprazole  40 mg Oral Daily   sodium chloride  1 g Oral BID WC   thiamine  100 mg Oral Daily   Or   thiamine  100 mg Intravenous Daily   Continuous Infusions:  sodium chloride 75  mL/hr at 09/23/22 1224   diltiazem (CARDIZEM) infusion 5 mg/hr (09/22/22 1941)   PRN Meds: acetaminophen, hydrALAZINE, HYDROmorphone (DILAUDID) injection, ondansetron (ZOFRAN) IV, mouth rinse, oxyCODONE-acetaminophen, polyethylene glycol  Time spent: 35 minutes  Author: Gillis Santa. MD [facility] 09/23/2022 4:30 PM  To reach On-call, see care teams to locate the attending and reach out to them via [email or web address]. If 7PM-7AM, please contact night-coverage If you still have difficulty reaching the attending provider, please page the [facility] (Director on Call) for [facility] on amion for assistance.

## 2022-09-23 NOTE — TOC Progression Note (Signed)
Transition of Care [facility]) - Progression Note    Patient Details  Name: Darren Rogers MRN: [id number] Date of Birth: November 07, 1955  [facility]) CM/SW Contact  Truddie Hidden, RN Phone Number: 09/23/2022, 9:52 AM  Clinical Narrative:    Case reviewed for DME needs and changes in discharge disposition.         Expected Discharge Plan and Services                                               Social Determinants of Health (SDOH) Interventions SDOH Screenings   Food Insecurity: No Food Insecurity (09/21/2022)  Housing: Low Risk  (09/21/2022)  Transportation Needs: No Transportation Needs (09/21/2022)  Utilities: Not At Risk (09/21/2022)  Tobacco Use: High Risk (09/20/2022)    Readmission Risk Interventions    10/13/2020   11:28 AM  Readmission Risk Prevention Plan  Transportation Screening Complete  Palliative Care Screening Not Applicable  Medication Review (RN Care Manager) Complete

## 2022-09-23 NOTE — TOC Benefit Eligibility Note (Signed)
Patient Advocate Encounter  Insurance verification completed.    The patient is currently admitted and upon discharge could be taking Eliquis 5 mg.  The current 30 day co-pay is $0.00.   The patient is insured through [employer] Medicare Part D   This test claim was processed through [facility]- copay amounts may vary at other pharmacies due to pharmacy/plan contracts, or as the patient moves through the different stages of their insurance plan.  Aleza Pew, CPHT Pharmacy Patient Advocate Specialist [facility] Patient Advocate Team Direct Number: [phone]  Fax: [phone]       

## 2022-09-24 DIAGNOSIS — I4891 Unspecified atrial fibrillation: Secondary | ICD-10-CM | POA: Diagnosis not present

## 2022-09-24 LAB — PHOSPHORUS: Phosphorus: 3.2 mg/dL (ref 2.5–4.6)

## 2022-09-24 LAB — BASIC METABOLIC PANEL
Anion gap: 9 (ref 5–15)
BUN: 11 mg/dL (ref 8–23)
CO2: 22 mmol/L (ref 22–32)
Calcium: 9.1 mg/dL (ref 8.9–10.3)
Chloride: 105 mmol/L (ref 98–111)
Creatinine, Ser: 0.91 mg/dL (ref 0.61–1.24)
GFR, Estimated: >60 mL/min (ref >60)
Glucose, Bld: 98 mg/dL (ref 70–99)
Potassium: 3.9 mmol/L (ref 3.5–5.1)
Sodium: 136 mmol/L (ref 135–145)

## 2022-09-24 LAB — GLUCOSE, CAPILLARY
Glucose-Capillary: 95 mg/dL (ref 70–99)
Glucose-Capillary: 124 mg/dL — ABNORMAL HIGH (ref 70–99)

## 2022-09-24 LAB — CBC
HCT: 39.8 % (ref 39.0–52.0)
Hemoglobin: 13.3 g/dL (ref 13.0–17.0)
MCH: 31.4 pg (ref 26.0–34.0)
MCHC: 33.4 g/dL (ref 30.0–36.0)
MCV: 94.1 fL (ref 80.0–100.0)
Platelets: 177 10*3/uL (ref 150–400)
RBC: 4.23 MIL/uL (ref 4.22–5.81)
RDW: 15.8 % — ABNORMAL HIGH (ref 11.5–15.5)
WBC: 4.3 10*3/uL (ref 4.0–10.5)
nRBC: 0 % (ref 0.0–0.2)

## 2022-09-24 LAB — MAGNESIUM: Magnesium: 1.9 mg/dL (ref 1.7–2.4)

## 2022-09-24 LAB — AMMONIA: Ammonia: 30 umol/L (ref 9–35)

## 2022-09-24 LAB — LIPASE, BLOOD: Lipase: 92 U/L — ABNORMAL HIGH (ref 11–51)

## 2022-09-24 MED ORDER — ADULT MULTIVITAMIN W/MINERALS CH: 1.0000 | ORAL_TABLET | Freq: Every day | ORAL | 0 refills | Status: AC

## 2022-09-24 MED ORDER — APIXABAN 5 MG PO TABS: 5.0000 mg | ORAL_TABLET | Freq: Two times a day (BID) | ORAL | 11 refills | Status: DC

## 2022-09-24 MED ORDER — FOLIC ACID 1 MG PO TABS: 1.0000 mg | ORAL_TABLET | Freq: Every day | ORAL | 0 refills | Status: AC

## 2022-09-24 MED ORDER — VITAMIN B-1 100 MG PO TABS: 100.0000 mg | ORAL_TABLET | Freq: Every day | ORAL | 0 refills | Status: AC

## 2022-09-24 MED ORDER — ATORVASTATIN CALCIUM 40 MG PO TABS: 40.0000 mg | ORAL_TABLET | Freq: Every day | ORAL | 5 refills | Status: DC

## 2022-09-24 MED ORDER — ACETAMINOPHEN 325 MG PO TABS: 650.0000 mg | ORAL_TABLET | Freq: Four times a day (QID) | ORAL | 0 refills | Status: AC | PRN

## 2022-09-24 NOTE — Discharge Summary (Signed)
[facility] Discharge Summary   Patient: Darren Rogers ZOX:[id number]  PCP: Lyndon Code, MD  Date of admission: 09/20/2022   Date of discharge:  09/24/2022     Discharge Diagnoses:  Principal Problem:   Atrial fibrillation with RVR Active Problems:   Alcoholic pancreatitis   CAD (coronary artery disease)   Alcohol abuse   Essential hypertension   Hyponatremia   Tobacco abuse   Cocaine abuse   Polysubstance abuse   Admitted From: Home Disposition:  Home   Recommendations for Outpatient Follow-up:  PCP: Follow-up with PCP in 1 week, repeat CBC and BMP after 1 week.  Alcohol abstinence counseling done. Follow with cardiology in 1 to 2 weeks for A-fib management Follow up LABS/TEST: CBC and BMP in 1 week   Diet recommendation: Cardiac diet  Activity: The patient is advised to gradually reintroduce usual activities, as tolerated  Discharge Condition: stable  Code Status: Full code   History of present illness: As per the H and P dictated on admission Hospital Course:  Darren Rogers is a 67 y.o. male with medical history significant of significant of polysubstance use (cocaine, alcohol, tobacco), recurrent alcoholic pancreatitis, A fib not on anticoagulants, HTN, HLD, depression, CAD, claudication, BPH, PVC, who presents with abdominal pain and heart racing.  Patient was found to have A-fib with RVR and acute pancreatitis. Patient was on Cardizem IV infusion and patient was admitted for further management as below.  Assessment and Plan:  # A-fib with RVR,  CHA2DS2VASc score 2 ( Age and HTN), patient is not on anticoagulation at home Start Eliquis 5 mg p.o. twice daily from 09/23/2022, patient agreed to start DOAC, patient had nosebleed 5 years ago so he stopped taking Eliquis and he was placed on aspirin but patient is willing to try again. S/p Cardizem IV infusion, weaned off gradually. Continue Cardizem CD 180 mg pod and Lopressor 100 mg p.o. twice daily home dose. On  4/24 started Eliquis 5 mg p.o. twice daily.  Patient was advised to follow-up with PCP and cardiology as an outpatient for further management.   # Acute pancreatitis Lipase 170--114--94--96 --92 gradually trending down Triglyceride 126, s/p IVF given for hydration.  Patient was on clear liquid diet, advance to soft diet yesterday, patient tolerated well.  Patient denies any abdominal pain.  Today patient had 1 episode of vomiting but felt that he is stable to go home today.  Patient was advised to gradually advance diet as per tolerance.  Follow-up with PCP.  Alcohol abstinence counseling done.  # CAD (coronary artery disease): Patient is not taking Lipitor, refused to restart Lipitor. Discontinued aspirin, started Eliquis as above. Resumed Lipitor on discharge, patient can decide to take it or not. # Essential hypertension, continued Cardizem CD and metoprolol home dose.  Patient was advised to monitor BP at home and follow with PCP. # Hyponatremia: Sodium 129.  Mental status normal.  This is most likely due to alcohol abuse Serum osmolality 278 within normal range. Na 136 now s/p sodium chloride 1 g twice daily given during hospital stay.  Hyponatremia resolved. # Polysubstance abuse, Tobacco abuse, Alcohol abuse, Cocaine abuse: -did counseling about importance of quitting substance use.  I spent lengthy time with pt to have talked with patient about this issue. S.p Nicotine patch and CIWA protocol, no withdrawal symptoms noticed during hospital stay.  Discharged on thiamine, folic acid and multivitamin for 30 days. # GERD: Continue protonix   Body mass index is 25.02 kg/m.  Nutrition  Interventions:   - Patient was instructed, not to drive, operate heavy machinery, perform activities at heights, swimming or participation in water activities or provide baby sitting services while on Pain, Sleep and Anxiety Medications; until his outpatient Physician has advised to do so again.  - Also recommended  to not to take more than prescribed Pain, Sleep and Anxiety Medications.  Patient was ambulatory without any assistance. On the day of the discharge the patient's vitals were stable, and no other acute medical condition were reported by patient. the patient was felt safe to be discharge at Home.  Consultants: None Procedures: None  Discharge Exam: General: Appear in no distress, no Rash; Oral Mucosa Clear, moist. Cardiovascular: S1 and S2 Present, no Murmur, irregular rhythm Respiratory: normal respiratory effort, Bilateral Air entry present and no Crackles, no wheezes Abdomen: Bowel Sound present, Soft and no tenderness, no hernia Extremities: no Pedal edema, no calf tenderness Neurology: alert and oriented to time, place, and person affect appropriate.  Filed Weights   09/20/22 0528  Weight: 70.3 kg   Vitals:   09/24/22 0730 09/24/22 1139  BP: (!) 157/102 (!) 112/98  Pulse: (!) 40 97  Resp: 16 20  Temp: 97.7 F (36.5 C) 98.1 F (36.7 C)  SpO2: 100% 100%    DISCHARGE MEDICATION: Allergies as of 09/24/2022   No Known Allergies      Medication List     STOP taking these medications    aspirin EC 81 MG tablet   oxyCODONE 5 MG immediate release tablet Commonly known as: Roxicodone       TAKE these medications    acetaminophen 325 MG tablet Commonly known as: TYLENOL Take 2 tablets (650 mg total) by mouth every 6 (six) hours as needed for mild pain, fever, headache or moderate pain.   apixaban 5 MG Tabs tablet Commonly known as: ELIQUIS Take 1 tablet (5 mg total) by mouth 2 (two) times daily.   atorvastatin 40 MG tablet Commonly known as: LIPITOR Take 1 tablet (40 mg total) by mouth daily.   diltiazem 180 MG 24 hr capsule Commonly known as: CARDIZEM CD Take 1 capsule (180 mg total) by mouth daily.   folic acid 1 MG tablet Commonly known as: FOLVITE Take 1 tablet (1 mg total) by mouth daily. Start taking on: September 25, 2022   metoprolol tartrate 100  MG tablet Commonly known as: LOPRESSOR Take 1 tablet (100 mg total) by mouth 2 (two) times daily.   multivitamin with minerals Tabs tablet Take 1 tablet by mouth daily.   pantoprazole 40 MG tablet Commonly known as: PROTONIX Take 1 tablet (40 mg total) by mouth daily.   polyethylene glycol 17 g packet Commonly known as: MIRALAX / GLYCOLAX Take 17 g by mouth daily as needed.   thiamine 100 MG tablet Commonly known as: Vitamin B-1 Take 1 tablet (100 mg total) by mouth daily. Start taking on: September 25, 2022       No Known Allergies Discharge Instructions     Call MD for:   Complete by: As directed    Chest pain or palpitations   Call MD for:  difficulty breathing, headache or visual disturbances   Complete by: As directed    Call MD for:  extreme fatigue   Complete by: As directed    Call MD for:  persistant dizziness or light-headedness   Complete by: As directed    Call MD for:  persistant nausea and vomiting   Complete by: As directed  Call MD for:  severe uncontrolled pain   Complete by: As directed    Call MD for:  temperature >100.4   Complete by: As directed    Diet - low sodium heart healthy   Complete by: As directed    Take small portions of multiple meals for few days and then gradually advance as per tolerance.   Discharge instructions   Complete by: As directed    Follow-up with PCP in 1 week, repeat CBC and BMP after 1 week.  Alcohol abstinence counseling done. Follow with cardiology in 1 to 2 weeks for A-fib management   Increase activity slowly   Complete by: As directed        The results of significant diagnostics from this hospitalization (including imaging, microbiology, ancillary and laboratory) are listed below for reference.    Significant Diagnostic Studies: CT ABDOMEN PELVIS W CONTRAST  Result Date: 09/20/2022 CLINICAL DATA:  66 year old male with history of upper abdominal pain. Suspected recurrent pancreatitis. EXAM: CT ABDOMEN AND  PELVIS WITH CONTRAST TECHNIQUE: Multidetector CT imaging of the abdomen and pelvis was performed using the standard protocol following bolus administration of intravenous contrast. RADIATION DOSE REDUCTION: This exam was performed according to the departmental dose-optimization program which includes automated exposure control, adjustment of the mA and/or kV according to patient size and/or use of iterative reconstruction technique. CONTRAST:  OMNIPAQUE IOHEXOL 300 MG/ML  SOLN COMPARISON:  CT of the abdomen and pelvis 08/06/2022. FINDINGS: Lower chest: Cardiomegaly. Atherosclerosis of the descending thoracic aorta. Hepatobiliary: Low attenuation throughout the hepatic parenchyma, indicative of a background of severe hepatic steatosis. No suspicious cystic or solid hepatic lesions. No intra or extrahepatic biliary ductal dilatation. Gallbladder is normal in appearance. Pancreas: No pancreatic mass. No pancreatic ductal dilatation. No pancreatic or peripancreatic fluid collections or inflammatory changes. Spleen: Unremarkable. Adrenals/Urinary Tract: Bilateral kidneys and bilateral adrenal glands are normal in appearance. No hydroureteronephrosis. Urinary bladder is diffusely thickened and mildly trabeculated, similar to the prior study. Stomach/Bowel: The appearance of the stomach is normal. No pathologic dilatation of small bowel or colon. Normal appendix. Vascular/Lymphatic: Aortic atherosclerosis with fusiform ectasia of the infrarenal abdominal aorta which measures up to 2.5 x 2.6 cm shortly above the aortic bifurcation. 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 Reproductive: Prostate gland and seminal vesicles are unremarkable in appearance. Other: No significant volume of ascites.  No pneumoperitoneum.  Musculoskeletal: There are no aggressive appearing lytic or blastic lesions noted in the visualized portions of the skeleton. IMPRESSION: 1. No acute findings are noted in the abdomen or pelvis to account for the patient's symptoms. Specifically, no definite imaging findings suggestive of acute pancreatitis confidently identified at this time. 2. Severe hepatic steatosis. 3. 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 4. Additional incidental findings, as above. Electronically Signed   By: Trudie Reed M.D.   On: 09/20/2022 07:22   DG Chest Portable 1 View  Result Date: 09/20/2022 CLINICAL DATA:  67 year old male with history of atrial fibrillation with rapid ventricular rhythm. EXAM: PORTABLE CHEST 1 VIEW COMPARISON:  Chest x-ray 08/06/2022. FINDINGS: Lung volumes are normal. No acute consolidative airspace disease. No pleural effusions. No pneumothorax. No evidence of  pulmonary edema. Mild cardiomegaly (unchanged). Upper mediastinal contours are within normal limits. IMPRESSION: 1. No radiographic evidence of acute cardiopulmonary disease. 2. Mild cardiomegaly. Electronically Signed   By: Trudie Reed M.D.   On: 09/20/2022 06:08    Microbiology: No results found for this or any previous visit (from the past 240 hour(s)).   Labs: CBC: Recent Labs  Lab 09/20/22 0534 09/21/22 0549 09/22/22 0555 09/23/22 0600 09/24/22 0612  WBC 4.2 4.2 4.4 4.0 4.3  NEUTROABS 1.4*  --   --   --   --   HGB 14.2 13.7 13.4 12.8* 13.3  HCT 41.2 39.7 39.0 37.7* 39.8  MCV 92.0 90.6 91.5 92.9 94.1  PLT 206 202 180 173 177   Basic Metabolic Panel: Recent Labs  Lab 09/20/22 0534 09/21/22 0549 09/22/22 0555 09/23/22 0600 09/24/22 0612  NA 129* 133* 131* 134* 136  K 3.8 4.1 3.8 3.4* 3.9  CL 98 100 100 104 105  CO2 17* 22  22 21* 22  GLUCOSE 93 107* 89 91 98  BUN 6* 8 6* 7* 11  CREATININE 1.04 0.79 0.74 0.80 0.91  CALCIUM 8.9 8.8* 8.8* 8.7* 9.1  MG 1.9 1.8 1.8 1.8 1.9  PHOS 3.4 3.0 3.1 3.4 3.2   Liver Function Tests: Recent Labs  Lab 09/20/22 0534 09/22/22 0555  AST 45* 33  ALT 29 26  ALKPHOS 81 79  BILITOT 0.8 1.1  PROT 7.0 6.8  ALBUMIN 3.7 3.5   Recent Labs  Lab 09/20/22 0534 09/21/22 0549 09/22/22 0555 09/23/22 0600 09/24/22 0612  LIPASE 170* 114* 94* 96* 92*   Recent Labs  Lab 09/22/22 0555 09/23/22 0600 09/24/22 0612  AMMONIA Cardiac Enzymes: No results for input(s): "CKTOTAL", "CKMB", "CKMBINDEX", "TROPONINI" in the last 168 hours. BNP (last 3 results) No results for input(s): "BNP" in the last 8760 hours. CBG: Recent Labs  Lab 09/21/22 0742 09/22/22 0751 09/23/22 0835 09/24/22 0724 09/24/22 1137  GLUCAP 86 85 93 95 124*    Time spent: 35 minutes  Signed:  Gillis Santa  [facility] 09/24/2022 11:59 AM

## 2022-10-05 ENCOUNTER — Encounter (INDEPENDENT_AMBULATORY_CARE_PROVIDER_SITE_OTHER): Payer: 59 | Admitting: Nurse Practitioner

## 2022-10-15 ENCOUNTER — Other Ambulatory Visit: Payer: Self-pay | Admitting: Nurse Practitioner

## 2022-11-09 ENCOUNTER — Other Ambulatory Visit: Payer: Self-pay | Admitting: Nurse Practitioner

## 2022-11-20 ENCOUNTER — Other Ambulatory Visit: Payer: Self-pay | Admitting: Nurse Practitioner

## 2022-11-20 NOTE — Telephone Encounter (Signed)
Hi,   Could you schedule this patient an overdue visit? The patient was last seen by [name] on 08-26-21. Thank you so much.

## 2022-11-20 NOTE — Telephone Encounter (Signed)
This is a [facility] pt 

## 2022-11-24 NOTE — Telephone Encounter (Signed)
Left message on voice mail to schedule

## 2022-12-20 ENCOUNTER — Inpatient Hospital Stay
Admission: EM | Admit: 2022-12-20 | Discharge: 2022-12-28 | DRG: 280 | Disposition: A | Discharge status: Home / Self Care | Payer: 59 | Attending: Osteopathic Medicine | Admitting: Osteopathic Medicine

## 2022-12-20 ENCOUNTER — Emergency Department: Payer: 59

## 2022-12-20 ENCOUNTER — Other Ambulatory Visit: Payer: Self-pay

## 2022-12-20 DIAGNOSIS — I110 Hypertensive heart disease with heart failure: Secondary | ICD-10-CM | POA: Diagnosis present

## 2022-12-20 DIAGNOSIS — Z6825 Body mass index (BMI) 25.0-25.9, adult: Secondary | ICD-10-CM | POA: Diagnosis not present

## 2022-12-20 DIAGNOSIS — R0602 Shortness of breath: Secondary | ICD-10-CM | POA: Diagnosis not present

## 2022-12-20 DIAGNOSIS — F17210 Nicotine dependence, cigarettes, uncomplicated: Secondary | ICD-10-CM | POA: Diagnosis present

## 2022-12-20 DIAGNOSIS — Z79899 Other long term (current) drug therapy: Secondary | ICD-10-CM | POA: Diagnosis not present

## 2022-12-20 DIAGNOSIS — I2510 Atherosclerotic heart disease of native coronary artery without angina pectoris: Secondary | ICD-10-CM | POA: Diagnosis not present

## 2022-12-20 DIAGNOSIS — Z91199 Patient's noncompliance with other medical treatment and regimen due to unspecified reason: Secondary | ICD-10-CM | POA: Diagnosis not present

## 2022-12-20 DIAGNOSIS — I10 Essential (primary) hypertension: Secondary | ICD-10-CM | POA: Diagnosis present

## 2022-12-20 DIAGNOSIS — I5021 Acute systolic (congestive) heart failure: Secondary | ICD-10-CM | POA: Diagnosis not present

## 2022-12-20 DIAGNOSIS — Z833 Family history of diabetes mellitus: Secondary | ICD-10-CM | POA: Diagnosis not present

## 2022-12-20 DIAGNOSIS — I428 Other cardiomyopathies: Secondary | ICD-10-CM | POA: Diagnosis present

## 2022-12-20 DIAGNOSIS — R7989 Other specified abnormal findings of blood chemistry: Secondary | ICD-10-CM | POA: Diagnosis present

## 2022-12-20 DIAGNOSIS — E669 Obesity, unspecified: Secondary | ICD-10-CM | POA: Diagnosis present

## 2022-12-20 DIAGNOSIS — I493 Ventricular premature depolarization: Secondary | ICD-10-CM | POA: Diagnosis present

## 2022-12-20 DIAGNOSIS — I499 Cardiac arrhythmia, unspecified: Secondary | ICD-10-CM | POA: Diagnosis not present

## 2022-12-20 DIAGNOSIS — Z743 Need for continuous supervision: Secondary | ICD-10-CM | POA: Diagnosis not present

## 2022-12-20 DIAGNOSIS — I081 Rheumatic disorders of both mitral and tricuspid valves: Secondary | ICD-10-CM | POA: Diagnosis present

## 2022-12-20 DIAGNOSIS — I272 Pulmonary hypertension, unspecified: Secondary | ICD-10-CM | POA: Diagnosis present

## 2022-12-20 DIAGNOSIS — Z8249 Family history of ischemic heart disease and other diseases of the circulatory system: Secondary | ICD-10-CM | POA: Diagnosis not present

## 2022-12-20 DIAGNOSIS — Z7901 Long term (current) use of anticoagulants: Secondary | ICD-10-CM | POA: Diagnosis not present

## 2022-12-20 DIAGNOSIS — I739 Peripheral vascular disease, unspecified: Secondary | ICD-10-CM | POA: Diagnosis present

## 2022-12-20 DIAGNOSIS — I472 Ventricular tachycardia, unspecified: Secondary | ICD-10-CM | POA: Diagnosis present

## 2022-12-20 DIAGNOSIS — I25118 Atherosclerotic heart disease of native coronary artery with other forms of angina pectoris: Secondary | ICD-10-CM | POA: Diagnosis not present

## 2022-12-20 DIAGNOSIS — E785 Hyperlipidemia, unspecified: Secondary | ICD-10-CM | POA: Diagnosis present

## 2022-12-20 DIAGNOSIS — I4821 Permanent atrial fibrillation: Secondary | ICD-10-CM | POA: Diagnosis present

## 2022-12-20 DIAGNOSIS — F32A Depression, unspecified: Secondary | ICD-10-CM | POA: Diagnosis present

## 2022-12-20 DIAGNOSIS — F1020 Alcohol dependence, uncomplicated: Secondary | ICD-10-CM | POA: Diagnosis present

## 2022-12-20 DIAGNOSIS — Z91148 Patient's other noncompliance with medication regimen for other reason: Secondary | ICD-10-CM

## 2022-12-20 DIAGNOSIS — I4891 Unspecified atrial fibrillation: Secondary | ICD-10-CM | POA: Diagnosis not present

## 2022-12-20 DIAGNOSIS — R079 Chest pain, unspecified: Secondary | ICD-10-CM | POA: Diagnosis not present

## 2022-12-20 DIAGNOSIS — I214 Non-ST elevation (NSTEMI) myocardial infarction: Principal | ICD-10-CM | POA: Diagnosis present

## 2022-12-20 DIAGNOSIS — R6889 Other general symptoms and signs: Secondary | ICD-10-CM | POA: Diagnosis not present

## 2022-12-20 DIAGNOSIS — R778 Other specified abnormalities of plasma proteins: Secondary | ICD-10-CM | POA: Diagnosis not present

## 2022-12-20 DIAGNOSIS — I482 Chronic atrial fibrillation, unspecified: Secondary | ICD-10-CM | POA: Diagnosis not present

## 2022-12-20 DIAGNOSIS — I5023 Acute on chronic systolic (congestive) heart failure: Secondary | ICD-10-CM | POA: Diagnosis present

## 2022-12-20 DIAGNOSIS — N400 Enlarged prostate without lower urinary tract symptoms: Secondary | ICD-10-CM | POA: Diagnosis present

## 2022-12-20 DIAGNOSIS — Z7982 Long term (current) use of aspirin: Secondary | ICD-10-CM

## 2022-12-20 LAB — CBC WITH DIFFERENTIAL/PLATELET
Abs Immature Granulocytes: 0.02 10*3/uL (ref 0.00–0.07)
Basophils Absolute: 0 10*3/uL (ref 0.0–0.1)
Basophils Relative: 1 %
Eosinophils Absolute: 0 10*3/uL (ref 0.0–0.5)
Eosinophils Relative: 1 %
HCT: 37.9 % — ABNORMAL LOW (ref 39.0–52.0)
Hemoglobin: 13.4 g/dL (ref 13.0–17.0)
Immature Granulocytes: 1 %
Lymphocytes Relative: 35 %
Lymphs Abs: 1.3 10*3/uL (ref 0.7–4.0)
MCH: 32.8 pg (ref 26.0–34.0)
MCHC: 35.4 g/dL (ref 30.0–36.0)
MCV: 92.9 fL (ref 80.0–100.0)
Monocytes Absolute: 0.5 10*3/uL (ref 0.1–1.0)
Monocytes Relative: 12 %
Neutro Abs: 1.9 10*3/uL (ref 1.7–7.7)
Neutrophils Relative %: 50 %
Platelets: 201 10*3/uL (ref 150–400)
RBC: 4.08 MIL/uL — ABNORMAL LOW (ref 4.22–5.81)
RDW: 16.6 % — ABNORMAL HIGH (ref 11.5–15.5)
WBC: 3.8 10*3/uL — ABNORMAL LOW (ref 4.0–10.5)
nRBC: 0 % (ref 0.0–0.2)

## 2022-12-20 LAB — BASIC METABOLIC PANEL
Anion gap: 9 (ref 5–15)
BUN: <5 mg/dL — ABNORMAL LOW (ref 8–23)
CO2: 20 mmol/L — ABNORMAL LOW (ref 22–32)
Calcium: 8.4 mg/dL — ABNORMAL LOW (ref 8.9–10.3)
Chloride: 106 mmol/L (ref 98–111)
Creatinine, Ser: 0.82 mg/dL (ref 0.61–1.24)
GFR, Estimated: >60 mL/min (ref >60)
Glucose, Bld: 90 mg/dL (ref 70–99)
Potassium: 4.1 mmol/L (ref 3.5–5.1)
Sodium: 135 mmol/L (ref 135–145)

## 2022-12-20 LAB — URINE DRUG SCREEN, QUALITATIVE (ARMC ONLY)
Amphetamines, Ur Screen: NOT DETECTED
Barbiturates, Ur Screen: NOT DETECTED
Benzodiazepine, Ur Scrn: NOT DETECTED
Cannabinoid 50 Ng, Ur ~~LOC~~: NOT DETECTED
Cocaine Metabolite,Ur ~~LOC~~: NOT DETECTED
MDMA (Ecstasy)Ur Screen: NOT DETECTED
Methadone Scn, Ur: NOT DETECTED
Opiate, Ur Screen: NOT DETECTED
Phencyclidine (PCP) Ur S: NOT DETECTED
Tricyclic, Ur Screen: NOT DETECTED

## 2022-12-20 LAB — HEPARIN LEVEL (UNFRACTIONATED): Heparin Unfractionated: <0.1 IU/mL — ABNORMAL LOW (ref 0.30–0.70)

## 2022-12-20 LAB — BRAIN NATRIURETIC PEPTIDE: B Natriuretic Peptide: 283.9 pg/mL — ABNORMAL HIGH (ref 0.0–100.0)

## 2022-12-20 LAB — TROPONIN I (HIGH SENSITIVITY)
Troponin I (High Sensitivity): 12 ng/L (ref <18)
Troponin I (High Sensitivity): 101 ng/L (ref <18)
Troponin I (High Sensitivity): 197 ng/L (ref <18)

## 2022-12-20 LAB — PROTIME-INR
INR: 1.1 (ref 0.8–1.2)
Prothrombin Time: 14 seconds (ref 11.4–15.2)

## 2022-12-20 LAB — APTT: aPTT: 27 seconds (ref 24–36)

## 2022-12-20 MED ORDER — ACETAMINOPHEN 325 MG PO TABS: 650.0000 mg | ORAL_TABLET | ORAL | Status: DC | PRN

## 2022-12-20 MED ORDER — NITROGLYCERIN 0.4 MG SL SUBL: 0.4000 mg | SUBLINGUAL_TABLET | SUBLINGUAL | Status: DC | PRN

## 2022-12-20 MED ORDER — ASPIRIN 300 MG RE SUPP: 300.0000 mg | RECTAL | Status: DC

## 2022-12-20 MED ORDER — ATORVASTATIN CALCIUM 20 MG PO TABS
40.0000 mg | ORAL_TABLET | Freq: Every day | ORAL | Status: DC
  Administered 2022-12-21 – 2022-12-28 (×8): 40 mg via ORAL
  Filled 2022-12-20 (×9): qty 2

## 2022-12-20 MED ORDER — HEPARIN (PORCINE) 25000 UT/250ML-% IV SOLN
750.0000 [IU]/h | INTRAVENOUS | Status: DC
  Administered 2022-12-20: 850 [IU]/h via INTRAVENOUS
  Administered 2022-12-21 – 2022-12-23 (×2): 750 [IU]/h via INTRAVENOUS
  Filled 2022-12-20 (×4): qty 250

## 2022-12-20 MED ORDER — ASPIRIN 81 MG PO TBEC: 81.0000 mg | DELAYED_RELEASE_TABLET | Freq: Every day | ORAL | Status: DC

## 2022-12-20 MED ORDER — ASPIRIN 81 MG PO CHEW: 324.0000 mg | CHEWABLE_TABLET | ORAL | Status: DC

## 2022-12-20 MED ORDER — ASPIRIN 81 MG PO CHEW
324.0000 mg | CHEWABLE_TABLET | Freq: Once | ORAL | Status: DC
  Filled 2022-12-20: qty 4

## 2022-12-20 MED ORDER — HEPARIN BOLUS VIA INFUSION
4000.0000 [IU] | Freq: Once | INTRAVENOUS | Status: AC
  Administered 2022-12-20: 4000 [IU] via INTRAVENOUS
  Filled 2022-12-20: qty 4000

## 2022-12-20 MED ORDER — DILTIAZEM HCL 25 MG/5ML IV SOLN: 15.0000 mg | Freq: Once | INTRAVENOUS | Status: AC

## 2022-12-20 MED ORDER — DILTIAZEM HCL 25 MG/5ML IV SOLN
INTRAVENOUS | Status: AC
  Administered 2022-12-20: 15 mg via INTRAVENOUS
  Filled 2022-12-20: qty 5

## 2022-12-20 MED ORDER — DILTIAZEM HCL-DEXTROSE 125-5 MG/125ML-% IV SOLN (PREMIX)
5.0000 mg/h | INTRAVENOUS | Status: DC
  Administered 2022-12-20: 5 mg/h via INTRAVENOUS
  Administered 2022-12-21: 15 mg/h via INTRAVENOUS
  Filled 2022-12-20 (×2): qty 125

## 2022-12-20 MED ORDER — ONDANSETRON HCL 4 MG/2ML IJ SOLN: 4.0000 mg | Freq: Four times a day (QID) | INTRAMUSCULAR | Status: DC | PRN

## 2022-12-20 NOTE — ED Triage Notes (Signed)
Pt is from home. Called EMS for CP that started yesterday intermittently but today woke pt from dead sleep due to pain being so severe. Pt also had SOB on exertion stated by EMS. Pt has hx of AFIB. Pt has not been taking his dilt for the past couple of weeks due to being out of his prescription and doctor would not refill without seeing patient in person however patient has been unable to get transportation to doctors office. Pt has been taking his metoprolol. EMS gave patient 10 mg of dilt and 500cc bolus. Has bilateral feet swelling which is not normal for this patient. Pt also over the past few months decided to stop taking eliquis due to having frequent nose bleeds. EMS vitals 190-210s HR before dilt, after 120s-160s, BP 158/86, BS 171, 99% on RA.

## 2022-12-20 NOTE — ED Notes (Signed)
Pt states he took 4 chewable ASA with EMS en route

## 2022-12-20 NOTE — ED Provider Notes (Signed)
[facility] Provider Note    Event Date/Time   First MD Initiated Contact with Patient 12/20/22 1758     (approximate)   History   Chest Pain (/) and Shortness of Breath (On exertion )   HPI  Darren Rogers is a 67 y.o. male who presents to the urgency department today because of concerns for chest pain.  The patient states that the pain started yesterday.  He was able to get to sleep however while sleeping it woke him up.  He describes it as sharp and 10 out of 10 located in the central chest.  He does have a history of A-fib states that he gets somewhat similar pain when he has A-fib.  He has not been taking his Eliquis or diltiazem over the past couple of weeks.  Patient has had some shortness of breath with exertion.  Denies any fevers.     Physical Exam   Triage Vital Signs: ED Triage Vitals  Encounter Vitals Group     BP 12/20/22 1756 133/88     Systolic BP Percentile --      Diastolic BP Percentile --      Pulse Rate 12/20/22 1756 (!) 145     Resp 12/20/22 1756 (!) 27     Temp 12/20/22 1756 98.2 F (36.8 C)     Temp Source 12/20/22 1756 Oral     SpO2 12/20/22 1756 100 %     Weight 12/20/22 1746 154 lb 15.7 oz (70.3 kg)     Height 12/20/22 1746 5\' 6"  (1.676 m)     Head Circumference --      Peak Flow --      Pain Score 12/20/22 1745 2     Pain Loc --      Pain Education --      Exclude from Growth Chart --     Most recent vital signs: Vitals:   12/20/22 1756  BP: 133/88  Pulse: (!) 145  Resp: (!) 27  Temp: 98.2 F (36.8 C)  SpO2: 100%   General: Awake, alert, oriented. CV:  Good peripheral perfusion. Tachycardia, irregular rhythm. Resp:  Normal effort. Lungs clear. Abd:  No distention.    ED Results / Procedures / Treatments   Labs (all labs ordered are listed, but only abnormal results are displayed) Labs Reviewed  CBC WITH DIFFERENTIAL/PLATELET - Abnormal; Notable for the following components:      Result Value   WBC  3.8 (*)    RBC 4.08 (*)    HCT 37.9 (*)    RDW 16.6 (*)    All other components within normal limits  BRAIN NATRIURETIC PEPTIDE - Abnormal; Notable for the following components:   B Natriuretic Peptide 283.9 (*)    All other components within normal limits  BASIC METABOLIC PANEL - Abnormal; Notable for the following components:   CO2 20 (*)    BUN <5 (*)    Calcium 8.4 (*)    All other components within normal limits  TROPONIN I (HIGH SENSITIVITY) - Abnormal; Notable for the following components:   Troponin I (High Sensitivity) 101 (*)    All other components within normal limits  APTT  PROTIME-INR  HEPARIN LEVEL (UNFRACTIONATED)  APTT  CBC  HEPARIN LEVEL (UNFRACTIONATED)  TROPONIN I (HIGH SENSITIVITY)     EKG  I, Phineas Semen, attending physician, personally viewed and interpreted this EKG  EKG Time: 1748 Rate: 145 Rhythm: atrial fibrillation Axis: normal Intervals: qtc 412 QRS:  narrow ST changes: no st elevation Impression: abnormal ekg   RADIOLOGY I independently interpreted and visualized the CXR. My interpretation: No pneumonia Radiology interpretation:  IMPRESSION:  No active disease.      PROCEDURES:  Critical Care performed: Yes  CRITICAL CARE Performed by: Phineas Semen   Total critical care time: 35 minutes  Critical care time was exclusive of separately billable procedures and treating other patients.  Critical care was necessary to treat or prevent imminent or life-threatening deterioration.  Critical care was time spent personally by me on the following activities: development of treatment plan with patient and/or surrogate as well as nursing, discussions with consultants, evaluation of patient's response to treatment, examination of patient, obtaining history from patient or surrogate, ordering and performing treatments and interventions, ordering and review of laboratory studies, ordering and review of radiographic studies, pulse  oximetry and re-evaluation of patient's condition.   Procedures    MEDICATIONS ORDERED IN ED: Medications - No data to display   IMPRESSION / MDM / ASSESSMENT AND PLAN / ED COURSE  I reviewed the triage vital signs and the nursing notes.                              Differential diagnosis includes, but is not limited to, ACS, pneumonia, anemia, afib, PE  Patient's presentation is most consistent with acute presentation with potential threat to life or bodily function.   The patient is on the cardiac monitor to evaluate for evidence of arrhythmia and/or significant heart rate changes.  Patient presents to the emergency department today because of concerns for chest pain.  On exam patient is in A-fib with RVR.  Will send out blood work to evaluate for possible heart injury, will check chest x-ray. Will give diltiazem bolus to start to slow down heart rate.   Patient's heart rate did slightly slow after the diltiazem bolus however not significantly.  Will place patient on diltiazem infusion.  Additionally patient's troponin went from normal to elevated.  Per chart review he has not had elevated troponins recently with similar admissions.  Because this is we will start heparin drip.  Discussed with Dr. Para March with the hospitalist service who will evaluate for admission.      FINAL CLINICAL IMPRESSION(S) / ED DIAGNOSES   Final diagnoses:  Atrial fibrillation, unspecified type (HCC)  Elevated troponin    Note:  This document was prepared using Dragon voice recognition software and may include unintentional dictation errors.    Phineas Semen, MD 12/20/22 [phone]

## 2022-12-20 NOTE — H&P (Signed)
History and Physical    Patient: Darren Rogers:[id number] DOB: Feb 28, 1956 DOA: 12/20/2022 DOS: the patient was seen and examined on 12/20/2022 PCP: Lyndon Code, MD  Patient coming from: Home  Chief Complaint:  Chief Complaint  Patient presents with   Chest Pain        Shortness of Breath    On exertion     HPI: Darren Rogers is a 67 y.o. 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  He also had shortness of breath and palpitations and admits to not taking his diltiazem as he should as he ran out of refills.  EMS found him with a rate of 192- 210 and administered bolus diltiazem and route and also administered aspirin 325. ED course and data review: On arrival BP 133/88 with heart rate 145, respirations 27 O2 sat 100% on room air and afebrile.  Labs significant for troponin 12> 101 and BNP 283.  Labs otherwise unremarkable.  EKG, personally viewed and interpreted showing A-fib at 145. Chest x-ray no active disease Patient was given a repeat diltiazem bolus of 15 mg and started on a diltiazem infusion with improvement in rate to 114 by the time of request for admission. Due to troponin bump, he was started on heparin bolus and infusion for possible NSTEMI. Chest pain resolved by admission. Hospitalist consulted for admission.   Review of Systems: As mentioned in the history of present illness. All other systems reviewed and are negative.  Past Medical History:  Diagnosis Date   Claudication (HCC)    a. 06/2015 ABI: R - 0.73, L - 0.73. 30-49% bilat SFA stenosis. 50-74% R Profunda stenosis; b. 01/2017 ABI: R 0.89, L 0.88, TBI R 0.65, L 1.0.   Erectile dysfunction    Essential hypertension    History  of echocardiogram    a. 03/29/2014: EF 55-60%, mild LVH, normal RVSP;  b. 05/2015 Echo: EF 60-65%, triv AI, mild MR; c. 03/2021 Echo: EF 55-60%, no rwma, mild LVH, nl RV fxn, mildly dil LA, mild MR.   Hyperlipidemia    Non-obstructive CAD    a. 01/27/2014 Cath: LM nl, LAD mild diff dz w/o obs, LCX no sig obs - scattered 20-30% mLCx, RCA no obs dz, EF 70%; b. 06/2015 MV; EF 60%, no ischemia; c. 07/2020 MV: No ischemia/scar.   Persistent atrial fibrillation (HCC)    a. Pt says Dx >40yrs ago w/ ? RFCA @ [facility];  b. Recurrent 12/2013;  b. Rx Flecainide and Xarelto-->Intermittent compliance; c. 03/2021 Zio: 100% Afib w/ avg rate of 99 bpm. Rare PVCs.   PVC's (premature ventricular contractions)    a. 05/2017 Zio: 4000 PVCs in 48 hrs (2%). Brief run of SVT.   Tobacco abuse    a. ongoing - 1 ppd.   Past Surgical History:  Procedure Laterality Date   CARDIAC CATHETERIZATION     CARDIAC CATHETERIZATION     [facility]   LEFT HEART CATH Right 01/27/2014   Procedure: LEFT HEART CATH;  Surgeon: Micheline Chapman, MD;  Location: [facility] CATH LAB;  Service: Cardiovascular;  Laterality: Right;   Social History:  reports that he has been smoking cigarettes. He has a 15 pack-year smoking history. He has never used smokeless tobacco. He reports  current alcohol use. He reports that he does not currently use drugs after having used the following drugs: Cocaine.  No Known Allergies  Family History  Problem Relation Age of Onset   Coronary artery disease Father 16   Diabetes Mother    Diabetes Sister    Diabetes Brother    Diabetes Maternal Grandmother    Diabetes Sister    Diabetes Sister     Prior to Admission medications   Medication Sig Start Date End Date Taking? Authorizing Provider  apixaban (ELIQUIS) 5 MG TABS tablet Take 1 tablet (5 mg total) by mouth 2 (two) times daily. 09/24/22 09/24/23  Gillis Santa, MD  atorvastatin (LIPITOR) 40 MG tablet Take 1 tablet (40 mg total) by mouth daily. 09/24/22 03/23/23  Gillis Santa, MD  diltiazem (CARDIZEM CD) 180 MG 24 hr capsule Take 1 capsule (180 mg total) by mouth daily. PLEASE CALL OFFICE TO SCHEDULE APPOINTMENT PRIOR TO NEXT REFILL (2nd attempt) 11/23/22   Iran Ouch, MD  metoprolol tartrate (LOPRESSOR) 100 MG tablet TAKE 1 TABLET BY MOUTH TWICE A DAY 11/09/22   Creig Hines, NP  pantoprazole (PROTONIX) 40 MG tablet TAKE 1 TABLET BY MOUTH DAILY 11/09/22   Creig Hines, NP  polyethylene glycol (MIRALAX / GLYCOLAX) 17 g packet Take 17 g by mouth daily as needed. 05/24/22   Alford Highland, MD    Physical Exam: Vitals:   12/20/22 1915 12/20/22 1929 12/20/22 2000 12/20/22 2002  BP:   (!) 141/88   Pulse: 67 (!) 146 (!) 53 (!) 114  Resp: 17  18 14   Temp:      TempSrc:      SpO2: 99%  98% 95%  Weight:      Height:       Physical Exam Vitals and nursing note reviewed.  Constitutional:      General: He is not in acute distress. HENT:     Head: Normocephalic and atraumatic.  Cardiovascular:     Rate and Rhythm: Tachycardia present. Rhythm irregular.     Heart sounds: Normal heart sounds.  Pulmonary:     Effort: Pulmonary effort is normal.     Breath sounds: Normal breath sounds.  Abdominal:     Palpations: Abdomen is soft.     Tenderness: There is no abdominal tenderness.  Neurological:     Mental Status: Mental status is at baseline.     Labs on Admission: I have personally reviewed following labs and imaging studies  CBC: Recent Labs  Lab 12/20/22 1749  WBC 3.8*  NEUTROABS 1.9  HGB 13.4  HCT 37.9*  MCV 92.9  PLT 201   Basic Metabolic Panel: Recent Labs  Lab 12/20/22 1954  NA 135  K 4.1  CL 106  CO2 20*  GLUCOSE 90  BUN <5*  CREATININE 0.82  CALCIUM 8.4*   GFR: Estimated Creatinine Clearance: 78.9 mL/min (by C-G formula based on SCr of 0.82 mg/dL). Liver Function Tests: No results for input(s): "AST", "ALT", "ALKPHOS", "BILITOT", "PROT", "ALBUMIN" in the last 168 hours. No results for  input(s): "LIPASE", "AMYLASE" in the last 168 hours. No results for input(s): "AMMONIA" in the last 168 hours. Coagulation Profile: No results for input(s): "INR", "PROTIME" in the last 168 hours. Cardiac Enzymes: No results for input(s): "CKTOTAL", "CKMB", "CKMBINDEX", "TROPONINI" in the last 168 hours. BNP (last 3 results) No results for input(s): "PROBNP" in the last 8760 hours. HbA1C: No results for input(s): "HGBA1C" in the last 72 hours. CBG: No  results for input(s): "GLUCAP" in the last 168 hours. Lipid Profile: No results for input(s): "CHOL", "HDL", "LDLCALC", "TRIG", "CHOLHDL", "LDLDIRECT" in the last 72 hours. Thyroid Function Tests: No results for input(s): "TSH", "T4TOTAL", "FREET4", "T3FREE", "THYROIDAB" in the last 72 hours. Anemia Panel: No results for input(s): "VITAMINB12", "FOLATE", "FERRITIN", "TIBC", "IRON", "RETICCTPCT" in the last 72 hours. Urine analysis:    Component Value Date/Time   COLORURINE STRAW (A) 09/20/2022 0534   APPEARANCEUR CLEAR (A) 09/20/2022 0534   APPEARANCEUR Clear 07/30/2016 1600   LABSPEC 1.002 (L) 09/20/2022 0534   LABSPEC 1.019 03/28/2014 1430   PHURINE 6.0 09/20/2022 0534   GLUCOSEU NEGATIVE 09/20/2022 0534   GLUCOSEU Negative 03/28/2014 1430   HGBUR NEGATIVE 09/20/2022 0534   BILIRUBINUR NEGATIVE 09/20/2022 0534   BILIRUBINUR Negative 07/30/2016 1600   BILIRUBINUR Negative 03/28/2014 1430   KETONESUR NEGATIVE 09/20/2022 0534   PROTEINUR NEGATIVE 09/20/2022 0534   NITRITE NEGATIVE 09/20/2022 0534   LEUKOCYTESUR NEGATIVE 09/20/2022 0534   LEUKOCYTESUR Negative 03/28/2014 1430    Radiological Exams on Admission: DG Chest Portable 1 View  Result Date: 12/20/2022 CLINICAL DATA:  Shortness of breath EXAM: PORTABLE CHEST 1 VIEW COMPARISON:  09/20/2022 FINDINGS: Heart is borderline in size. Lungs clear. No effusions or edema. No acute bony abnormality. IMPRESSION: No active disease. Electronically Signed   By: Charlett Nose M.D.   On:  12/20/2022 19:01     Data Reviewed: Relevant notes from primary care and specialist visits, past discharge summaries as available in EHR, including Care Everywhere. Prior diagnostic testing as pertinent to current admission diagnoses Updated medications and problem lists for reconciliation ED course, including vitals, labs, imaging, treatment and response to treatment Triage notes, nursing and pharmacy notes and ED provider's notes Notable results as noted in HPI   Assessment and Plan:     Atrial fibrillation with rapid ventricular response (HCC) - Continue diltiazem infusion and transition to p.o. per protocol - Continue heparin infusion  NSTEMI  history of CAD - Patient with chest pain and troponin 12>101  - Possibly NSTEMI but suspecting demand ischemia secondary to rapid A. fib - Chest pain improved with rate control measures in the ED -Continue heparin infusion - Continue  Rosuvastatin, -Will get echo to evaluate for wall motion abnormality.  Last echo was in 03/2021 with EF 55 to 60%.   History of cocaine use - Will get a UDS     Essential hypertension - Continue home meds     Alcohol use disorder, moderate, dependence (HCC) - CIWA withdrawal protocol -Drinks 240 ounce beers daily Counseled on cutting back     DVT prophylaxis: Heparin infusion Consults: Cardiology, [facility]  Advance Care Planning:   Code Status: Prior   Family Communication: none  Disposition Plan: Back to previous home environment  Severity of Illness: The appropriate patient status for this patient is INPATIENT. Inpatient status is judged to be reasonable and necessary in order to provide the required intensity of service to ensure the patient's safety. The patient's presenting symptoms, physical exam findings, and initial radiographic and laboratory data in the context of their chronic comorbidities is felt to place them at high risk for further clinical deterioration. Furthermore, it is not  anticipated that the patient will be medically stable for discharge from the hospital within 2 midnights of admission.   * I certify that at the point of admission it is my clinical judgment that the patient will require inpatient hospital care spanning beyond 2 midnights from the point of  admission due to high intensity of service, high risk for further deterioration and high frequency of surveillance required.*  Author: Andris Baumann, MD 12/20/2022 9:52 PM  For on call review [email or web address].

## 2022-12-20 NOTE — Consult Note (Signed)
ANTICOAGULATION CONSULT NOTE - Initial Consult  Pharmacy Consult for Heparin Infusion Indication: chest pain/ACS  No Known Allergies  Patient Measurements: Height: 5\' 6"  (167.6 cm) Weight: 70.3 kg (154 lb 15.7 oz) IBW/kg (Calculated) : 63.8 Heparin Dosing Weight: 70.3 kg  Vital Signs: Temp: 98.2 F (36.8 C) (07/21 1756) Temp Source: Oral (07/21 1756) BP: 141/88 (07/21 2000) Pulse Rate: 114 (07/21 2002)  Labs: Recent Labs    12/20/22 1749 12/20/22 1953 12/20/22 1954  HGB 13.4  --   --   HCT 37.9*  --   --   PLT 201  --   --   CREATININE  --   --  0.82  TROPONINIHS 12 101*  --     Estimated Creatinine Clearance: 78.9 mL/min (by C-G formula based on SCr of 0.82 mg/dL).   Medical History: Past Medical History:  Diagnosis Date   Claudication (HCC)    a. 06/2015 ABI: R - 0.73, L - 0.73. 30-49% bilat SFA stenosis. 50-74% R Profunda stenosis; b. 01/2017 ABI: R 0.89, L 0.88, TBI R 0.65, L 1.0.   Erectile dysfunction    Essential hypertension    History of echocardiogram    a. 03/29/2014: EF 55-60%, mild LVH, normal RVSP;  b. 05/2015 Echo: EF 60-65%, triv AI, mild MR; c. 03/2021 Echo: EF 55-60%, no rwma, mild LVH, nl RV fxn, mildly dil LA, mild MR.   Hyperlipidemia    Non-obstructive CAD    a. 01/27/2014 Cath: LM nl, LAD mild diff dz w/o obs, LCX no sig obs - scattered 20-30% mLCx, RCA no obs dz, EF 70%; b. 06/2015 MV; EF 60%, no ischemia; c. 07/2020 MV: No ischemia/scar.   Persistent atrial fibrillation (HCC)    a. Pt says Dx >70yrs ago w/ ? RFCA @ [facility];  b. Recurrent 12/2013;  b. Rx Flecainide and Xarelto-->Intermittent compliance; c. 03/2021 Zio: 100% Afib w/ avg rate of 99 bpm. Rare PVCs.   PVC's (premature ventricular contractions)    a. 05/2017 Zio: 4000 PVCs in 48 hrs (2%). Brief run of SVT.   Tobacco abuse    a. ongoing - 1 ppd.    Assessment: Darren Rogers is a 67 y.o. male presenting with chest pain. PMH significant for AF, TUD, CAD, PAD, PSA, GERD. Patient is  prescribed apixaban but has not been taking it due to frequent nose bleeds per ED RN. Last dose of apixaban was 5 days ago per ED RN. Pharmacy has been consulted to initiate and manage heparin infusion.   Baseline Labs: aPTT 27, HL <0.10, PT 14.0, INR 1.1, Hgb 13.4, Hct 37.9, Plt 201   Goal of Therapy:  Heparin level 0.3-0.7 units/ml aPTT 66-102 seconds Monitor platelets by anticoagulation protocol: Yes   Plan:  Give 4000 units bolus x 1  Start heparin infusion at 850 units/hr Check HL in 6 hours Continue to monitor H&H and platelets daily while on heparin infusion    Celene Squibb, PharmD Clinical Pharmacist 12/20/2022 9:36 PM

## 2022-12-21 ENCOUNTER — Inpatient Hospital Stay (HOSPITAL_COMMUNITY)
Admit: 2022-12-21 | Discharge: 2022-12-21 | Disposition: A | Discharge status: Home / Self Care | Payer: 59 | Attending: Internal Medicine | Admitting: Internal Medicine

## 2022-12-21 DIAGNOSIS — I4821 Permanent atrial fibrillation: Secondary | ICD-10-CM

## 2022-12-21 DIAGNOSIS — I214 Non-ST elevation (NSTEMI) myocardial infarction: Secondary | ICD-10-CM

## 2022-12-21 DIAGNOSIS — I5021 Acute systolic (congestive) heart failure: Secondary | ICD-10-CM | POA: Diagnosis not present

## 2022-12-21 DIAGNOSIS — I482 Chronic atrial fibrillation, unspecified: Secondary | ICD-10-CM | POA: Diagnosis not present

## 2022-12-21 LAB — CBC
HCT: 35.5 % — ABNORMAL LOW (ref 39.0–52.0)
Hemoglobin: 12.7 g/dL — ABNORMAL LOW (ref 13.0–17.0)
MCH: 33.2 pg (ref 26.0–34.0)
MCHC: 35.8 g/dL (ref 30.0–36.0)
MCV: 92.9 fL (ref 80.0–100.0)
Platelets: 200 10*3/uL (ref 150–400)
RBC: 3.82 MIL/uL — ABNORMAL LOW (ref 4.22–5.81)
RDW: 16.4 % — ABNORMAL HIGH (ref 11.5–15.5)
WBC: 5.2 10*3/uL (ref 4.0–10.5)
nRBC: 0 % (ref 0.0–0.2)

## 2022-12-21 LAB — BASIC METABOLIC PANEL
Anion gap: 8 (ref 5–15)
BUN: 8 mg/dL (ref 8–23)
CO2: 22 mmol/L (ref 22–32)
Calcium: 8.7 mg/dL — ABNORMAL LOW (ref 8.9–10.3)
Chloride: 105 mmol/L (ref 98–111)
Creatinine, Ser: 0.92 mg/dL (ref 0.61–1.24)
GFR, Estimated: >60 mL/min (ref >60)
Glucose, Bld: 161 mg/dL — ABNORMAL HIGH (ref 70–99)
Potassium: 4.2 mmol/L (ref 3.5–5.1)
Sodium: 135 mmol/L (ref 135–145)

## 2022-12-21 LAB — CBC WITH DIFFERENTIAL/PLATELET
Abs Immature Granulocytes: 0.02 10*3/uL (ref 0.00–0.07)
Basophils Absolute: 0 10*3/uL (ref 0.0–0.1)
Basophils Relative: 1 %
Eosinophils Absolute: 0 10*3/uL (ref 0.0–0.5)
Eosinophils Relative: 1 %
HCT: 34.4 % — ABNORMAL LOW (ref 39.0–52.0)
Hemoglobin: 12.1 g/dL — ABNORMAL LOW (ref 13.0–17.0)
Immature Granulocytes: 0 %
Lymphocytes Relative: 35 %
Lymphs Abs: 1.7 10*3/uL (ref 0.7–4.0)
MCH: 32.8 pg (ref 26.0–34.0)
MCHC: 35.2 g/dL (ref 30.0–36.0)
MCV: 93.2 fL (ref 80.0–100.0)
Monocytes Absolute: 0.4 10*3/uL (ref 0.1–1.0)
Monocytes Relative: 7 %
Neutro Abs: 2.8 10*3/uL (ref 1.7–7.7)
Neutrophils Relative %: 56 %
Platelets: 203 10*3/uL (ref 150–400)
RBC: 3.69 MIL/uL — ABNORMAL LOW (ref 4.22–5.81)
RDW: 16.7 % — ABNORMAL HIGH (ref 11.5–15.5)
WBC: 4.9 10*3/uL (ref 4.0–10.5)
nRBC: 0 % (ref 0.0–0.2)

## 2022-12-21 LAB — ECHOCARDIOGRAM COMPLETE
AR max vel: 2.51 cm2
AV Area VTI: 2.4 cm2
AV Area mean vel: 2.57 cm2
AV Mean grad: 2.3 mmHg
AV Peak grad: 5.2 mmHg
Ao pk vel: 1.14 m/s
Area-P 1/2: 6.04 cm2
Height: 66 in
MV VTI: 2.75 cm2
S' Lateral: 3.9 cm
Weight: 2479.73 oz

## 2022-12-21 LAB — HEPARIN LEVEL (UNFRACTIONATED)
Heparin Unfractionated: 0.77 IU/mL — ABNORMAL HIGH (ref 0.30–0.70)
Heparin Unfractionated: 0.47 IU/mL (ref 0.30–0.70)

## 2022-12-21 MED ORDER — THIAMINE HCL 100 MG/ML IJ SOLN
100.0000 mg | Freq: Every day | INTRAMUSCULAR | Status: DC
  Filled 2022-12-21: qty 2

## 2022-12-21 MED ORDER — LORAZEPAM 2 MG/ML IJ SOLN: 1.0000 mg | INTRAMUSCULAR | Status: AC | PRN

## 2022-12-21 MED ORDER — FUROSEMIDE 10 MG/ML IJ SOLN
20.0000 mg | Freq: Once | INTRAMUSCULAR | Status: AC
  Administered 2022-12-21: 20 mg via INTRAVENOUS
  Filled 2022-12-21: qty 4

## 2022-12-21 MED ORDER — DIGOXIN 0.25 MG/ML IJ SOLN: 0.1250 mg | INTRAMUSCULAR | Status: DC | PRN

## 2022-12-21 MED ORDER — THIAMINE MONONITRATE 100 MG PO TABS
100.0000 mg | ORAL_TABLET | Freq: Every day | ORAL | Status: DC
  Administered 2022-12-21 – 2022-12-28 (×8): 100 mg via ORAL
  Filled 2022-12-21 (×8): qty 1

## 2022-12-21 MED ORDER — DIGOXIN 0.25 MG/ML IJ SOLN
0.1250 mg | INTRAMUSCULAR | Status: DC | PRN
  Administered 2022-12-21 – 2022-12-22 (×2): 0.125 mg via INTRAVENOUS
  Filled 2022-12-21 (×5): qty 2

## 2022-12-21 MED ORDER — LORAZEPAM 1 MG PO TABS
1.0000 mg | ORAL_TABLET | ORAL | Status: AC | PRN
  Administered 2022-12-21: 1 mg via ORAL
  Filled 2022-12-21: qty 1

## 2022-12-21 MED ORDER — FOLIC ACID 1 MG PO TABS
1.0000 mg | ORAL_TABLET | Freq: Every day | ORAL | Status: DC
  Administered 2022-12-21 – 2022-12-28 (×8): 1 mg via ORAL
  Filled 2022-12-21 (×8): qty 1

## 2022-12-21 MED ORDER — ADULT MULTIVITAMIN W/MINERALS CH
1.0000 | ORAL_TABLET | Freq: Every day | ORAL | Status: DC
  Administered 2022-12-21 – 2022-12-28 (×8): 1 via ORAL
  Filled 2022-12-21 (×8): qty 1

## 2022-12-21 MED ORDER — PANTOPRAZOLE SODIUM 40 MG PO TBEC
40.0000 mg | DELAYED_RELEASE_TABLET | Freq: Every day | ORAL | Status: DC
  Administered 2022-12-21 – 2022-12-28 (×8): 40 mg via ORAL
  Filled 2022-12-21 (×9): qty 1

## 2022-12-21 MED ORDER — POLYETHYLENE GLYCOL 3350 17 G PO PACK: 17.0000 g | PACK | Freq: Every day | ORAL | Status: DC | PRN

## 2022-12-21 MED ORDER — METOPROLOL TARTRATE 25 MG PO TABS
25.0000 mg | ORAL_TABLET | Freq: Four times a day (QID) | ORAL | Status: DC
  Administered 2022-12-21 – 2022-12-22 (×4): 25 mg via ORAL
  Filled 2022-12-21 (×5): qty 1

## 2022-12-21 MED ORDER — ASPIRIN 81 MG PO TBEC
81.0000 mg | DELAYED_RELEASE_TABLET | Freq: Every day | ORAL | Status: DC
  Administered 2022-12-21 – 2022-12-28 (×7): 81 mg via ORAL
  Filled 2022-12-21 (×9): qty 1

## 2022-12-21 NOTE — Consult Note (Signed)
ANTICOAGULATION CONSULT NOTE -   Pharmacy Consult for Heparin Infusion Indication: chest pain/ACS  No Known Allergies  Patient Measurements: Height: 5\' 6"  (167.6 cm) Weight: 70.3 kg (154 lb 15.7 oz) IBW/kg (Calculated) : 63.8 Heparin Dosing Weight: 70.3 kg  Vital Signs: Temp: 98.1 F (36.7 C) (07/22 0546) Temp Source: Oral (07/22 0546) BP: 132/85 (07/22 0600) Pulse Rate: 100 (07/22 0530)  Labs: Recent Labs    12/20/22 1749 12/20/22 1953 12/20/22 1954 12/20/22 2144 12/20/22 2147 12/21/22 0625  HGB 13.4  --   --   --   --  12.7*  HCT 37.9*  --   --   --   --  35.5*  PLT 201  --   --   --   --  200  APTT  --   --   --  27  --   --   LABPROT  --   --   --  14.0  --   --   INR  --   --   --  1.1  --   --   HEPARINUNFRC  --   --   --  <0.10*  --  0.77*  CREATININE  --   --  0.82  --   --   --   TROPONINIHS 12 101*  --   --  197*  --     Estimated Creatinine Clearance: 78.9 mL/min (by C-G formula based on SCr of 0.82 mg/dL).   Medical History: Past Medical History:  Diagnosis Date   Claudication (HCC)    a. 06/2015 ABI: R - 0.73, L - 0.73. 30-49% bilat SFA stenosis. 50-74% R Profunda stenosis; b. 01/2017 ABI: R 0.89, L 0.88, TBI R 0.65, L 1.0.   Erectile dysfunction    Essential hypertension    History of echocardiogram    a. 03/29/2014: EF 55-60%, mild LVH, normal RVSP;  b. 05/2015 Echo: EF 60-65%, triv AI, mild MR; c. 03/2021 Echo: EF 55-60%, no rwma, mild LVH, nl RV fxn, mildly dil LA, mild MR.   Hyperlipidemia    Non-obstructive CAD    a. 01/27/2014 Cath: LM nl, LAD mild diff dz w/o obs, LCX no sig obs - scattered 20-30% mLCx, RCA no obs dz, EF 70%; b. 06/2015 MV; EF 60%, no ischemia; c. 07/2020 MV: No ischemia/scar.   Persistent atrial fibrillation (HCC)    a. Pt says Dx >50yrs ago w/ ? RFCA @ [facility];  b. Recurrent 12/2013;  b. Rx Flecainide and Xarelto-->Intermittent compliance; c. 03/2021 Zio: 100% Afib w/ avg rate of 99 bpm. Rare PVCs.   PVC's (premature ventricular  contractions)    a. 05/2017 Zio: 4000 PVCs in 48 hrs (2%). Brief run of SVT.   Tobacco abuse    a. ongoing - 1 ppd.    Assessment: Darren Rogers is a 67 y.o. male presenting with chest pain. PMH significant for AF, TUD, CAD, PAD, PSA, GERD. Patient is prescribed apixaban but has not been taking it due to frequent nose bleeds per ED RN. Last dose of apixaban was 5 days ago per ED RN. Pharmacy has been consulted to initiate and manage heparin infusion.   Baseline Labs: aPTT 27, HL <0.10, PT 14.0, INR 1.1, Hgb 13.4, Hct 37.9, Plt 201   7/22 0625 HL=0.77 Supratherapeutic 850>750 u/hr   Goal of Therapy:  Heparin level 0.3-0.7 units/ml aPTT 66-102 seconds Monitor platelets by anticoagulation protocol: Yes   Plan:  7/22 0625 HL=0.77    Supratherapeutic Decrease heparin infusion  to 750 units/hr reCheck HL in 6 hours Continue to monitor H&H and platelets daily while on heparin infusion    Angelique Blonder, PharmD Clinical Pharmacist 12/21/2022 7:04 AM

## 2022-12-21 NOTE — Progress Notes (Signed)
       CROSS COVER NOTE  NAME: Darren Rogers MRN: [id number] DOB : 23-Dec-1955    Concern as stated by nurse / staff   Patient admitted for SOB, Afib. Patient was on cardizem initially but has since been d/c'd, patient HR is ranging low 100s-140s. Patient asymptomatic and resting in bed showing no distress. Patient is on hep at 7.5 for NSTEMI and will go to cath lab 7/23.      Pertinent findings on chart review: Cardiology note from today reviewed, per Dr. Kirke Corin: "Permanent atrial fibrillation: Recommend rate control with a beta-blocker. We started oral metoprolol with plans to discontinue IV diltiazem. If rate continues to be uncontrolled in spite of metoprolol, digoxin can be considered. Continue heparin drip for now with plans to transition to Eliquis after cardiac catheterization"   Assessment and  Interventions   Assessment: A-fib with RVR for cath in the a.m.  Plan: Continue metoprolol as ordered by cardiology Digoxin IV as needed as recommended by cardiology.  Will give digoxin 0.125 now hoping for improved control throughout the night X

## 2022-12-21 NOTE — Progress Notes (Signed)
*  PRELIMINARY RESULTS* Echocardiogram 2D Echocardiogram has been performed.  Darren Rogers 12/21/2022, 9:58 AM

## 2022-12-21 NOTE — Progress Notes (Signed)
Progress Note   Patient: Darren Rogers VWU:[id number] DOB: [birth year] DOA: 12/20/2022     1 DOS: the patient was seen and examined on 12/21/2022    Subjective:  Patient seen and examined at bedside this morning Denies nausea vomiting abdominal pain Admits to improvement in chest pain, respiratory function Denies urinary complaints Has been seen by cardiologist and being planned for cardiac catheterization hopefully tomorrow    Brief hospital course: Darren Rogers is a 67 y.o. 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  He also had shortness of breath and palpitations and admits to not taking his diltiazem as he should as he ran out of refills.  EMS found him with a rate of 192- 210 and administered bolus diltiazem and route and also administered aspirin 325. ED course and data review: On arrival BP 133/88 with heart rate 145, respirations 27 O2 sat 100% on room air and afebrile.  Labs significant for troponin 12> 101 and BNP 283.  Labs otherwise unremarkable.  EKG, personally viewed and interpreted showing A-fib at 145. Chest x-ray no active disease Patient was given a repeat diltiazem bolus of 15 mg and started on a diltiazem infusion with improvement in rate to 114 by the time of request for admission. Due to troponin bump, he was started on heparin bolus and infusion for possible NSTEMI. Chest pain resolved by admission. Hospitalist consulted for admission.     Assessment and Plan:  Atrial fibrillation with rapid ventricular response (HCC) Patient required Cardizem infusion on presentation Had been transitioned to oral Cardizem as well as Lopressor 100 mg twice daily Continue to monitor telemetry  closely Continue current heparin infusion and will be switched to Eliquis at discharge    NSTEMI  history of CAD - Patient with chest pain and troponin 12>101  Patient being managed for possible NSTEMI given several cardiac risk factors Continue ACS protocol with heparin infusion Continue rosuvastatin  Last echo was in 03/2021 with EF 55 to 60%.  Has been seen by cardiologist and being planned for cardiac catheterization hopefully tomorrow Continue to follow-up on echo report Audiologist on board and case discussed  History of cocaine use Urine drug screen negative     Essential hypertension Continue metoprolol     Alcohol use disorder, moderate, dependence (HCC) Continue CIWA protocol -Drinks 240 ounce beers daily Counseled on cutting back   DVT prophylaxis: Heparin infusion  Consults: Cardiology, [facility]   Advance Care Planning:   Code Status: Prior    Family Communication: none   Disposition Plan: Back to previous home environment    Physical Exam: Vitals and nursing note reviewed.  Constitutional:      General: In no acute distress HENT:     Head: Normocephalic and atraumatic.  Cardiovascular:     Rate and Rhythm: Irregularly irregular    Heart sounds: Normal heart sounds.  Pulmonary:     Effort: Pulmonary effort is normal.     Breath sounds: Normal breath sounds.  Abdominal:     Palpations: Abdomen is soft.     Tenderness: There is no abdominal tenderness.  Neurological: Alert and awake no focal deficit  Vitals:   12/21/22 1030 12/21/22 1035 12/21/22 1045 12/21/22 1100  BP: 124/78 124/78  119/82  Pulse:  (!) 109 (!) 45 90  Resp:      Temp:      TempSrc:      SpO2: 97%  100% 100%  Weight:      Height:        Data Reviewed: I have reviewed patient's CBC results, CMP, cardiologist documentation, EKG showing irregularly irregular rhythm suggestive of A-fib, troponin results, previous chart review as well as ED physician documentation  Author: Loyce Dys, MD 12/21/2022 5:41 PM  For on call review [email or web address].

## 2022-12-21 NOTE — Assessment & Plan Note (Signed)
Drinks 240 ounce beers daily

## 2022-12-21 NOTE — Consult Note (Signed)
ANTICOAGULATION CONSULT NOTE  Pharmacy Consult for Heparin Infusion Indication: chest pain/ACS  No Known Allergies  Patient Measurements: Height: 5\' 6"  (167.6 cm) Weight: 70.3 kg (154 lb 15.7 oz) IBW/kg (Calculated) : 63.8 Heparin Dosing Weight: 70.3 kg  Vital Signs: Temp: 98.1 F (36.7 C) (07/22 0953) Temp Source: Oral (07/22 0953) BP: 119/82 (07/22 1100) Pulse Rate: 90 (07/22 1100)  Labs: Recent Labs    12/20/22 1749 12/20/22 1953 12/20/22 1954 12/20/22 2144 12/20/22 2147 12/21/22 0625 12/21/22 1015 12/21/22 1517  HGB 13.4  --   --   --   --  12.7* 12.1*  --   HCT 37.9*  --   --   --   --  35.5* 34.4*  --   PLT 201  --   --   --   --  200 203  --   APTT  --   --   --  27  --   --   --   --   LABPROT  --   --   --  14.0  --   --   --   --   INR  --   --   --  1.1  --   --   --   --   HEPARINUNFRC  --   --   --  <0.10*  --  0.77*  --  0.47  CREATININE  --   --  0.82  --   --   --  0.92  --   TROPONINIHS 12 101*  --   --  197*  --   --   --     Estimated Creatinine Clearance: 70.3 mL/min (by C-G formula based on SCr of 0.92 mg/dL).   Medical History: Past Medical History:  Diagnosis Date   Claudication (HCC)    a. 06/2015 ABI: R - 0.73, L - 0.73. 30-49% bilat SFA stenosis. 50-74% R Profunda stenosis; b. 01/2017 ABI: R 0.89, L 0.88, TBI R 0.65, L 1.0.   Erectile dysfunction    Essential hypertension    History of echocardiogram    a. 03/29/2014: EF 55-60%, mild LVH, normal RVSP;  b. 05/2015 Echo: EF 60-65%, triv AI, mild MR; c. 03/2021 Echo: EF 55-60%, no rwma, mild LVH, nl RV fxn, mildly dil LA, mild MR.   Hyperlipidemia    Non-obstructive CAD    a. 01/27/2014 Cath: LM nl, LAD mild diff dz w/o obs, LCX no sig obs - scattered 20-30% mLCx, RCA no obs dz, EF 70%; b. 06/2015 MV; EF 60%, no ischemia; c. 07/2020 MV: No ischemia/scar.   Persistent atrial fibrillation (HCC)    a. Pt says Dx >77yrs ago w/ ? RFCA @ [facility];  b. Recurrent 12/2013;  b. Rx Flecainide and  Xarelto-->Intermittent compliance; c. 03/2021 Zio: 100% Afib w/ avg rate of 99 bpm. Rare PVCs.   PVC's (premature ventricular contractions)    a. 05/2017 Zio: 4000 PVCs in 48 hrs (2%). Brief run of SVT.   Tobacco abuse    a. ongoing - 1 ppd.    Assessment: Darren Rogers is a 67 y.o. male presenting with chest pain. PMH significant for AF, TUD, CAD, PAD, PSA, GERD. Patient is prescribed apixaban but has not been taking it due to frequent nose bleeds per ED RN. Last dose of apixaban was 5 days ago per ED RN. Pharmacy has been consulted to initiate and manage heparin infusion.   Baseline Labs: aPTT 27, HL <0.10, PT 14.0, INR 1.1, Hgb  13.4, Hct 37.9, Plt 201   Goal of Therapy:  Heparin level 0.3-0.7 units/ml aPTT 66-102 seconds Monitor platelets by anticoagulation protocol: Yes   Plan: heparin level therapeutic Continue heparin infusion at 750 units/hr reCheck heparin level in 6 hours to confirm Continue to monitor H&H and platelets daily while on heparin infusion    Lowella Bandy, PharmD Clinical Pharmacist 12/21/2022 3:56 PM

## 2022-12-21 NOTE — Consult Note (Addendum)
Cardiology Consultation   Patient ID: Darren Rogers MRN: [id number]; DOB: 02-04-1956  Admit date: 12/20/2022 Date of Consult: 12/21/2022  PCP:  Lyndon Code, MD   [facility] Providers Cardiologist:  Lorine Bears, MD   {  Patient Profile:   Darren Rogers is a 67 y.o. male with a hx of persistent Afib, nonobstructive CAD, HTN, HLD, noncompliance with polysubstance abuse who is being seen 12/21/2022 for the evaluation of chest pain at the request of Dr. Meriam Sprague.  History of Present Illness:   Darren Rogers was previously diagnosed with A-fib greater than 20 years ago and reports history of some type of ablation at [facility].  Had recurrent A-fib in August 2015 in the setting of chest pain he underwent diagnostic cath showing mild, nonobstructive CAD.  A-fib has been managed with flecainide and Xarelto, however compliance was poor.  January 2017, he had recurrent chest pain and A-fib and underwent stress testing which was nonischemic.  He also reported lower extremity claudication and underwent ABIs, which were abnormal with suggestion of mild to moderate bilateral SFA and right profunda stenosis.  He had recurrent A-fib in August 2018 in the setting of noncompliance and was resumed on metoprolol, flecainide, and Xarelto however, he was lost to follow-up.  He was hospitalized 3 times in 2022 in the setting of rapid A-fib and atypical chest pain in the setting of noncompliance.  It was felt A-fib was likely transitioning to permanent.  Heart monitor showed 100% A-fib burden with an average heart rate of 92.  Patient did not follow-up after hospitalizations.  Patient was last seen in the office in March 2023 and A-fib on metoprolol and diltiazem.  He was drinking several 40 ounce beers in a day.  No changes were made.  Patient presented to [facility] ED 12/20/2022 with chest pain and shortness of breath.  Pain started the day prior to presentation.  He was able to get sleep, however while sleeping it woke  him up.He also noted elevated heart rate. Normally heart racing improves on it's own, but this time it continued. He describes a sharp pain. He says he was not taking Eliquis, stopped it about 6 months ago. He reports his was taking diltiazem and lopressor.   He smokes 1/2 ppd. He drinks two 40 oz beers a day. No drug use.   In the ER blood pressure 133/88, heart rate 145 bpm, respiratory rate 27, afebrile, 100% O2.  Labs showed WBC 3.8, Hgb 13.4, BNP 283, CO2 20.  High-sensitivity troponin 12>101.  Chest x-ray showed no active disease.  EKG showed A-fib with a heart rate of 145 bpm.  Patient was started on IV heparin. patient was started on IV diltiazem and admitted for further workup.  Past Medical History:  Diagnosis Date   Claudication (HCC)    a. 06/2015 ABI: R - 0.73, L - 0.73. 30-49% bilat SFA stenosis. 50-74% R Profunda stenosis; b. 01/2017 ABI: R 0.89, L 0.88, TBI R 0.65, L 1.0.   Erectile dysfunction    Essential hypertension    History of echocardiogram    a. 03/29/2014: EF 55-60%, mild LVH, normal RVSP;  b. 05/2015 Echo: EF 60-65%, triv AI, mild MR; c. 03/2021 Echo: EF 55-60%, no rwma, mild LVH, nl RV fxn, mildly dil LA, mild MR.   Hyperlipidemia    Non-obstructive CAD    a. 01/27/2014 Cath: LM nl, LAD mild diff dz w/o obs, LCX no sig obs - scattered 20-30% mLCx, RCA no obs dz,  EF 70%; b. 06/2015 MV; EF 60%, no ischemia; c. 07/2020 MV: No ischemia/scar.   Persistent atrial fibrillation (HCC)    a. Pt says Dx >46yrs ago w/ ? RFCA @ [facility];  b. Recurrent 12/2013;  b. Rx Flecainide and Xarelto-->Intermittent compliance; c. 03/2021 Zio: 100% Afib w/ avg rate of 99 bpm. Rare PVCs.   PVC's (premature ventricular contractions)    a. 05/2017 Zio: 4000 PVCs in 48 hrs (2%). Brief run of SVT.   Tobacco abuse    a. ongoing - 1 ppd.    Past Surgical History:  Procedure Laterality Date   CARDIAC CATHETERIZATION     CARDIAC CATHETERIZATION     [facility]   LEFT HEART CATH Right 01/27/2014   Procedure:  LEFT HEART CATH;  Surgeon: Micheline Chapman, MD;  Location: [facility] CATH LAB;  Service: Cardiovascular;  Laterality: Right;     Home Medications:  Prior to Admission medications   Medication Sig Start Date End Date Taking? Authorizing Provider  aspirin EC 81 MG tablet Take 81 mg by mouth daily. Swallow whole.   Yes [provider]  diltiazem (CARDIZEM CD) 180 MG 24 hr capsule Take 1 capsule (180 mg total) by mouth daily. PLEASE CALL OFFICE TO SCHEDULE APPOINTMENT PRIOR TO NEXT REFILL (2nd attempt) 11/23/22  Yes Iran Ouch, MD  metoprolol tartrate (LOPRESSOR) 100 MG tablet TAKE 1 TABLET BY MOUTH TWICE A DAY 11/09/22  Yes Creig Hines, NP  pantoprazole (PROTONIX) 40 MG tablet TAKE 1 TABLET BY MOUTH DAILY 11/09/22  Yes Creig Hines, NP  apixaban (ELIQUIS) 5 MG TABS tablet Take 1 tablet (5 mg total) by mouth 2 (two) times daily. Patient not taking: Reported on 12/20/2022 09/24/22 09/24/23  Gillis Santa, MD  atorvastatin (LIPITOR) 40 MG tablet Take 1 tablet (40 mg total) by mouth daily. Patient not taking: Reported on 12/20/2022 09/24/22 03/23/23  Gillis Santa, MD  polyethylene glycol (MIRALAX / GLYCOLAX) 17 g packet Take 17 g by mouth daily as needed. Patient not taking: Reported on 12/20/2022 05/24/22   Alford Highland, MD    Inpatient Medications: Scheduled Meds:  aspirin EC  81 mg Oral Daily   atorvastatin  40 mg Oral Daily   folic acid  1 mg Oral Daily   multivitamin with minerals  1 tablet Oral Daily   thiamine  100 mg Oral Daily   Or   thiamine  100 mg Intravenous Daily   Continuous Infusions:  diltiazem (CARDIZEM) infusion 15 mg/hr (12/21/22 0714)   heparin 750 Units/hr (12/21/22 0714)   PRN Meds: acetaminophen, LORazepam **OR** LORazepam, nitroGLYCERIN, ondansetron (ZOFRAN) IV  Allergies:   No Known Allergies  Social History:   Social History   Socioeconomic History   Marital status: Single    Spouse name: Not on file   Number of children:  Not on file   Years of education: Not on file   Highest education level: Not on file  Occupational History   Not on file  Tobacco Use   Smoking status: Every Day    Current packs/day: 0.50    Average packs/day: 0.5 packs/day for 30.0 years (15.0 ttl pk-yrs)    Types: Cigarettes   Smokeless tobacco: Never  Vaping Use   Vaping status: Never Used  Substance and Sexual Activity   Alcohol use: Yes    Comment: 2 40 ounce beers a day   Drug use: Not Currently    Types: Cocaine   Sexual activity: Not on file  Other Topics Concern  Not on file  Social History Narrative   The patient lives in [place] with his sister. He works in a substance abuse program. 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 He is a long-time smoker, one pack per day. No illicit drugs. He is not married.   Social Determinants of Health   Financial Resource Strain: Not on file  Food Insecurity: No Food Insecurity (09/21/2022)   Hunger Vital Sign    Worried About Running Out of Food in the Last Year: Never true    Ran Out of Food in the Last Year: Never true  Transportation Needs: No Transportation Needs (09/21/2022)   PRAPARE - Administrator, Civil Service (Medical): No    Lack of Transportation (Non-Medical): No  Physical Activity: Not on file  Stress: Not on file  Social Connections: Not on file  Intimate Partner Violence: Not At Risk (09/21/2022)   Humiliation, Afraid, Rape, and Kick questionnaire    Fear of Current or Ex-Partner: No    Emotionally Abused: No    Physically Abused: No    Sexually Abused: No    Family History:    Family History  Problem Relation Age of Onset   Coronary artery disease Father 57   Diabetes Mother    Diabetes Sister    Diabetes Brother    Diabetes Maternal Grandmother    Diabetes Sister    Diabetes Sister      ROS:  Please see the history of present illness.   All other ROS reviewed and negative.     Physical  Exam/Data:   Vitals:   12/21/22 0600 12/21/22 0615 12/21/22 0700 12/21/22 0730  BP: 132/85  (!) 131/116 115/80  Pulse:   100 62  Resp:  18 20 (!) 22  Temp:      TempSrc:      SpO2: 100%  99% 100%  Weight:      Height:        Intake/Output Summary (Last 24 hours) at 12/21/2022 0826 Last data filed at 12/20/2022 1806 Gross per 24 hour  Intake --  Output 200 ml  Net -200 ml      12/20/2022    5:46 PM 09/20/2022    5:28 AM 08/07/2022    5:31 PM  Last 3 Weights  Weight (lbs) 154 lb 15.7 oz 155 lb 163 lb  Weight (kg) 70.3 kg 70.308 kg 73.936 kg     Body mass index is 25.01 kg/m.  General:  Well nourished, well developed, in no acute distress HEENT: normal Neck: no JVD Vascular: No carotid bruits; Distal pulses 2+ bilaterally Cardiac:  normal S1, S2; Irreg Irreg, tachy; no murmur  Lungs:  clear to auscultation bilaterally, no wheezing, rhonchi or rales  Abd: soft, nontender, no hepatomegaly  Ext: trace lower leg edema Musculoskeletal:  No deformities, BUE and BLE strength normal and equal Skin: warm and dry  Neuro:  CNs 2-12 intact, no focal abnormalities noted Psych:  Normal affect   EKG:  The EKG was personally reviewed and demonstrates:  Afib RVR STe Telemetry:  Telemetry was personally reviewed and demonstrates:  Afib HR around 100, 100-120  Relevant CV Studies:  Echo ordered  Heart monitor 04/2021 Patch Wear Time:  14 days and 0 hours (2022-10-12T12:34:30-0400 to 2022-10-26T12:34:30-0400)   Atrial Fibrillation occurred continuously (100% burden), ranging from 52-217 bpm (avg of 99 bpm). Isolated VEs were rare (<1.0%, 12790), VE Couplets were rare (<1.0%, 113), and VE  Triplets were rare (<1.0%, 10). Previously notified: MD notification   Echo 03/2021  1. Left ventricular ejection fraction, by estimation, is 55 to 60%. The  left ventricle has normal function. The left ventricle has no regional  wall motion abnormalities. There is mild left ventricular hypertrophy.   Left ventricular diastolic function  could not be evaluated.   2. Right ventricular systolic function is low normal. The right  ventricular size is normal.   3. Left atrial size was mildly dilated.   4. The mitral valve is normal in structure. Mild mitral valve  regurgitation. No evidence of mitral stenosis.   5. The aortic valve is tricuspid. There is mild thickening of the aortic  valve. Aortic valve regurgitation is trivial.   6. The inferior vena cava is normal in size with greater than 50%  respiratory variability, suggesting right atrial pressure of 3 mmHg.   MPI 07/2020 Narrative & Impression  Normal pharmacologic myocardial perfusion stress test without evidence of significant ischemia or scar. Grossly normal left ventricular systolic function by visual estimation. Calculated LVEF is unreliable due to gating issues related to atrial fibrillation. Coronary artery calcification and aortic atherosclerosis are noted on the attenuation correction CT. This is a low risk study.    Echo 07/2020 1. Left ventricular ejection fraction, by estimation, is 50 to 55%. The  left ventricle has low normal function. The left ventricle has no regional  wall motion abnormalities. There is mild left ventricular hypertrophy.  Left ventricular diastolic  parameters are indeterminate. The average left ventricular global  longitudinal strain is -8.7 %. The global longitudinal strain is abnormal.   2. Right ventricular systolic function is mildly reduced. The right  ventricular size is normal. Moderately increased right ventricular wall  thickness.   3. Left atrial size was mildly dilated.   4. The mitral valve is normal in structure. Mild mitral valve  regurgitation. No evidence of mitral stenosis.   5. The aortic valve is tricuspid. There is mild thickening of the aortic  valve. Aortic valve regurgitation is mild. Mild aortic valve sclerosis is  present, with no evidence of aortic valve stenosis.    6. The inferior vena cava is normal in size with greater than 50%  respiratory variability, suggesting right atrial pressure of 3 mmHg.   Laboratory Data:  High Sensitivity Troponin:   Recent Labs  Lab 12/20/22 1749 12/20/22 1953 12/20/22 2147  TROPONINIHS 12 101* 197*     Chemistry Recent Labs  Lab 12/20/22 1954  NA 135  K 4.1  CL 106  CO2 20*  GLUCOSE 90  BUN <5*  CREATININE 0.82  CALCIUM 8.4*  GFRNONAA >60  ANIONGAP 9    No results for input(s): "PROT", "ALBUMIN", "AST", "ALT", "ALKPHOS", "BILITOT" in the last 168 hours. Lipids No results for input(s): "CHOL", "TRIG", "HDL", "LABVLDL", "LDLCALC", "CHOLHDL" in the last 168 hours.  Hematology Recent Labs  Lab 12/20/22 1749 12/21/22 0625  WBC 3.8* 5.2  RBC 4.08* 3.82*  HGB 13.4 12.7*  HCT 37.9* 35.5*  MCV 92.9 92.9  MCH 32.8 33.2  MCHC 35.4 35.8  RDW 16.6* 16.4*  PLT 201 200   Thyroid No results for input(s): "TSH", "FREET4" in the last 168 hours.  BNP Recent Labs  Lab 12/20/22 1749  BNP 283.9*    DDimer No results for input(s): "DDIMER" in the last 168 hours.   Radiology/Studies:  DG Chest Portable 1 View  Result Date: 12/20/2022 CLINICAL DATA:  Shortness of breath EXAM: PORTABLE CHEST  1 VIEW COMPARISON:  09/20/2022 FINDINGS: Heart is borderline in size. Lungs clear. No effusions or edema. No acute bony abnormality. IMPRESSION: No active disease. Electronically Signed   By: Charlett Nose M.D.   On: 12/20/2022 19:01     Assessment and Plan:   NSTEMI H/o nonobstructive CAD - presented with chest pain and SOB found to be in rapid afib with elevated troponin, started on IV dilt and IV heparin - HS trop 12>101>197  - h/o multiple hospitalizations with chest pain and rapid afib - remote cath showed nonobstructive CAD - Myoview lexiscan in 2022 was nonischemic - continue Lipitor and lopressor - Unofficial Echo report showed reduced LVEF 40% - plan for R/L heart cath tomorrow Risks and benefits of  cardiac catheterization have been discussed with the patient.  These include bleeding, infection, kidney damage, stroke, heart attack, death.  The patient understands these risks and is willing to proceed.  Afib with RVR Permanent Afib - in rapid afib on admission started on IV dilt - h/o permanent afib - PTA diltiazem 180mg  daily and lopressor 100mg  BID - IV heparin in place of Eliquis - wean ditiazem drip - start metoprolol 25mg  Q6H  H/o cocaine use - UDS negative  HTN - BP low with dilt  Alcohol use - he drinks 2 40 oz beers daily  - CIWA per IM - cessation recommended  For questions or updates, please contact [facility] Please consult [email or web address] for contact info under    Signed, Tayden Nichelson David Stall, PA-C  12/21/2022 8:26 AM

## 2022-12-21 NOTE — TOC Initial Note (Signed)
Transition of Care [facility]) - Initial/Assessment Note    Patient Details  Name: Darren Rogers MRN: [id number] Date of Birth: Jan 03, 1956  Transition of Care [facility]) CM/SW Contact:    Kreg Shropshire, RN Phone Number: 12/21/2022, 9:14 AM  Clinical Narrative:                  Toc received consult regarding SA educations. Readmission prevention screening completed.   Pt stated that he lives with his sister. He has difficulty walking. He said he can't even walk to the mailbox. He has no DME at home. No HH/SNF services. Pt has no preferences on [facility] nor SNF.  He stated that he needs a taxi voucher whenever d/c.  Pt declined SA education at this time. He stated he drinks 2 40z of beer a day. He stated he know it is contributing to his health. He previously stopped drinking and was sober for 7 years. He declined any SA resources at this time.  Cm will continue to follow at this time   Barriers to Discharge: Continued Medical Work up   Patient Goals and CMS Choice     Choice offered to / list presented to : Patient      Expected Discharge Plan and Services       Living arrangements for the past 2 months: Single Family Home                                      Prior Living Arrangements/Services Living arrangements for the past 2 months: Single Family Home Lives with:: Self, Siblings                   Activities of Daily Living      Permission Sought/Granted                  Emotional Assessment Appearance:: Appears stated age Attitude/Demeanor/Rapport: Engaged Affect (typically observed): Calm Orientation: : Oriented to Self, Oriented to Place, Oriented to  Time, Oriented to Situation      Admission diagnosis:  Rapid atrial fibrillation (HCC) [I48.91] Patient Active Problem List   Diagnosis Date Noted   Chronic atrial fibrillation with RVR (HCC) 08/06/2022   GERD without esophagitis 08/06/2022   Hyponatremia 08/06/2022   Hyperkalemia 08/06/2022    Atrial fibrillation, chronic (HCC) 05/19/2022   GERD (gastroesophageal reflux disease) 05/19/2022   AKI (acute kidney injury) (HCC) 03/10/2021   Lactic acidosis 03/10/2021   Acute encephalopathy 03/10/2021   Symptomatic bradycardia 03/09/2021   Abdominal pain, RUQ    Alcoholic pancreatitis 12/07/2020   Non-adherence to medical treatment 12/07/2020   Atrial fibrillation (HCC) 11/20/2020   Abscess of buccal space of mouth 11/19/2020   Dental abscess 11/19/2020   Facial cellulitis_left 11/19/2020   Cellulitis of face    Atrial fibrillation with RVR (HCC) 08/06/2020   Demand ischemia    Polysubstance abuse (HCC)    Rapid atrial fibrillation (HCC) 08/05/2020   Alcohol use disorder, moderate, dependence (HCC) 08/05/2020   Cocaine abuse (HCC) 08/05/2020   Alcohol abuse 10/20/2019   Depression 10/20/2019   A-fib (HCC) 01/15/2017   Personal history of tobacco use, presenting hazards to health 08/17/2016   BPH associated with nocturia 07/30/2016   IFG (impaired fasting glucose) 07/30/2016   Other chest pain 08/22/2015   Essential hypertension    PAD (peripheral artery disease) (HCC)    Drug-induced erectile dysfunction 01/22/2015   CAD (  coronary artery disease) 04/11/2014   CAD in native artery 04/11/2014   Paroxysmal atrial fibrillation (HCC)    NSTEMI (non-ST elevated myocardial infarction) (HCC) 01/29/2014   Tobacco abuse    Hyperlipidemia    Atrial fibrillation with rapid ventricular response (HCC) 01/27/2014   PCP:  Lyndon Code, MD Pharmacy:   [facility] - [place], [place] - [address] [place] [address] Phone: [phone] Fax: [phone]  [facility] [address] [place] [address] Phone: [phone] Fax: [phone]  [facility] [address] ([address]), [place] - [address] [place]) [place] [address] Phone: [phone] Fax:  [phone]     Social Determinants of Health (SDOH) Social History: SDOH Screenings   Food Insecurity: No Food Insecurity (09/21/2022)  Housing: Low Risk  (09/21/2022)  Transportation Needs: No Transportation Needs (09/21/2022)  Utilities: Not At Risk (09/21/2022)  Tobacco Use: High Risk (12/20/2022)   SDOH Interventions:     Readmission Risk Interventions    12/21/2022    9:13 AM 10/13/2020   11:28 AM  Readmission Risk Prevention Plan  Transportation Screening Complete Complete  PCP or Specialist Appt within 3-5 Days Complete --  HRI or Home Care Consult Complete   Social Work Consult for Recovery Care Planning/Counseling Complete   Palliative Care Screening Not Applicable Not Applicable  Medication Review (RN Care Manager) Referral to Pharmacy Complete

## 2022-12-22 ENCOUNTER — Encounter
Admission: EM | Disposition: A | Discharge status: Home / Self Care | Payer: Self-pay | Source: Home / Self Care | Attending: Osteopathic Medicine

## 2022-12-22 DIAGNOSIS — I214 Non-ST elevation (NSTEMI) myocardial infarction: Secondary | ICD-10-CM | POA: Diagnosis not present

## 2022-12-22 DIAGNOSIS — I5021 Acute systolic (congestive) heart failure: Secondary | ICD-10-CM | POA: Diagnosis not present

## 2022-12-22 DIAGNOSIS — I482 Chronic atrial fibrillation, unspecified: Secondary | ICD-10-CM | POA: Diagnosis not present

## 2022-12-22 LAB — CBC WITH DIFFERENTIAL/PLATELET
Abs Immature Granulocytes: 0.01 10*3/uL (ref 0.00–0.07)
Basophils Absolute: 0.1 10*3/uL (ref 0.0–0.1)
Basophils Relative: 1 %
Eosinophils Absolute: 0.1 10*3/uL (ref 0.0–0.5)
Eosinophils Relative: 1 %
HCT: 36.3 % — ABNORMAL LOW (ref 39.0–52.0)
Hemoglobin: 12.3 g/dL — ABNORMAL LOW (ref 13.0–17.0)
Immature Granulocytes: 0 %
Lymphocytes Relative: 41 %
Lymphs Abs: 2.2 10*3/uL (ref 0.7–4.0)
MCH: 32.3 pg (ref 26.0–34.0)
MCHC: 33.9 g/dL (ref 30.0–36.0)
MCV: 95.3 fL (ref 80.0–100.0)
Monocytes Absolute: 0.5 10*3/uL (ref 0.1–1.0)
Monocytes Relative: 9 %
Neutro Abs: 2.6 10*3/uL (ref 1.7–7.7)
Neutrophils Relative %: 48 %
Platelets: 213 10*3/uL (ref 150–400)
RBC: 3.81 MIL/uL — ABNORMAL LOW (ref 4.22–5.81)
RDW: 16.6 % — ABNORMAL HIGH (ref 11.5–15.5)
WBC: 5.4 10*3/uL (ref 4.0–10.5)
nRBC: 0 % (ref 0.0–0.2)

## 2022-12-22 LAB — BASIC METABOLIC PANEL
Anion gap: 7 (ref 5–15)
BUN: 12 mg/dL (ref 8–23)
CO2: 24 mmol/L (ref 22–32)
Calcium: 9 mg/dL (ref 8.9–10.3)
Chloride: 104 mmol/L (ref 98–111)
Creatinine, Ser: 1.1 mg/dL (ref 0.61–1.24)
GFR, Estimated: >60 mL/min (ref >60)
Glucose, Bld: 92 mg/dL (ref 70–99)
Potassium: 4.6 mmol/L (ref 3.5–5.1)
Sodium: 135 mmol/L (ref 135–145)

## 2022-12-22 LAB — HEPARIN LEVEL (UNFRACTIONATED)
Heparin Unfractionated: 0.34 IU/mL (ref 0.30–0.70)
Heparin Unfractionated: 0.45 IU/mL (ref 0.30–0.70)

## 2022-12-22 LAB — LIPID PANEL
Cholesterol: 193 mg/dL (ref 0–200)
HDL: 64 mg/dL (ref >40)
LDL Cholesterol: 114 mg/dL — ABNORMAL HIGH (ref 0–99)
Total CHOL/HDL Ratio: 3 RATIO
Triglycerides: 76 mg/dL (ref <150)
VLDL: 15 mg/dL (ref 0–40)

## 2022-12-22 SURGERY — LEFT HEART CATH AND CORONARY ANGIOGRAPHY
Anesthesia: Moderate Sedation

## 2022-12-22 MED ORDER — METOPROLOL TARTRATE 25 MG PO TABS
37.5000 mg | ORAL_TABLET | Freq: Four times a day (QID) | ORAL | Status: DC
  Administered 2022-12-22 – 2022-12-23 (×5): 37.5 mg via ORAL
  Filled 2022-12-22 (×5): qty 2

## 2022-12-22 MED ORDER — DIGOXIN 125 MCG PO TABS
0.1250 mg | ORAL_TABLET | Freq: Every day | ORAL | Status: DC
  Administered 2022-12-22 – 2022-12-24 (×3): 0.125 mg via ORAL
  Filled 2022-12-22 (×3): qty 1

## 2022-12-22 MED ORDER — LOSARTAN POTASSIUM 25 MG PO TABS
12.5000 mg | ORAL_TABLET | Freq: Every day | ORAL | Status: DC
  Administered 2022-12-22 – 2022-12-23 (×2): 12.5 mg via ORAL
  Filled 2022-12-22 (×2): qty 0.5
  Filled 2022-12-22: qty 1

## 2022-12-22 NOTE — Consult Note (Signed)
ANTICOAGULATION CONSULT NOTE  Pharmacy Consult for Heparin Infusion Indication: chest pain/ACS  No Known Allergies  Patient Measurements: Height: 5\' 6"  (167.6 cm) Weight: 70.3 kg (154 lb 15.7 oz) IBW/kg (Calculated) : 63.8 Heparin Dosing Weight: 70.3 kg  Vital Signs: BP: 149/96 (07/22 2337) Pulse Rate: 135 (07/22 2337)  Labs: Recent Labs    12/20/22 1749 12/20/22 1953 12/20/22 1954 12/20/22 2144 12/20/22 2144 12/20/22 2147 12/21/22 0625 12/21/22 1015 12/21/22 1517 12/22/22 0030  HGB 13.4  --   --   --   --   --  12.7* 12.1*  --   --   HCT 37.9*  --   --   --   --   --  35.5* 34.4*  --   --   PLT 201  --   --   --   --   --  200 203  --   --   APTT  --   --   --  27  --   --   --   --   --   --   LABPROT  --   --   --  14.0  --   --   --   --   --   --   INR  --   --   --  1.1  --   --   --   --   --   --   HEPARINUNFRC  --   --   --  <0.10*   < >  --  0.77*  --  0.47 0.45  CREATININE  --   --  0.82  --   --   --   --  0.92  --   --   TROPONINIHS 12 101*  --   --   --  197*  --   --   --   --    < > = values in this interval not displayed.    Estimated Creatinine Clearance: 70.3 mL/min (by C-G formula based on SCr of 0.92 mg/dL).   Medical History: Past Medical History:  Diagnosis Date   Claudication (HCC)    a. 06/2015 ABI: R - 0.73, L - 0.73. 30-49% bilat SFA stenosis. 50-74% R Profunda stenosis; b. 01/2017 ABI: R 0.89, L 0.88, TBI R 0.65, L 1.0.   Erectile dysfunction    Essential hypertension    History of echocardiogram    a. 03/29/2014: EF 55-60%, mild LVH, normal RVSP;  b. 05/2015 Echo: EF 60-65%, triv AI, mild MR; c. 03/2021 Echo: EF 55-60%, no rwma, mild LVH, nl RV fxn, mildly dil LA, mild MR.   Hyperlipidemia    Non-obstructive CAD    a. 01/27/2014 Cath: LM nl, LAD mild diff dz w/o obs, LCX no sig obs - scattered 20-30% mLCx, RCA no obs dz, EF 70%; b. 06/2015 MV; EF 60%, no ischemia; c. 07/2020 MV: No ischemia/scar.   Persistent atrial fibrillation (HCC)     a. Pt says Dx >52yrs ago w/ ? RFCA @ [facility];  b. Recurrent 12/2013;  b. Rx Flecainide and Xarelto-->Intermittent compliance; c. 03/2021 Zio: 100% Afib w/ avg rate of 99 bpm. Rare PVCs.   PVC's (premature ventricular contractions)    a. 05/2017 Zio: 4000 PVCs in 48 hrs (2%). Brief run of SVT.   Tobacco abuse    a. ongoing - 1 ppd.    Assessment: Darren Rogers is a 67 y.o. male presenting with chest pain. PMH significant for AF, TUD,  CAD, PAD, PSA, GERD. Patient is prescribed apixaban but has not been taking it due to frequent nose bleeds per ED RN. Last dose of apixaban was 5 days ago per ED RN. Pharmacy has been consulted to initiate and manage heparin infusion.   Baseline Labs: aPTT 27, HL <0.10, PT 14.0, INR 1.1, Hgb 13.4, Hct 37.9, Plt 201   Goal of Therapy:  Heparin level 0.3-0.7 units/ml aPTT 66-102 seconds Monitor platelets by anticoagulation protocol: Yes   Plan: heparin level therapeutic Continue heparin infusion at 750 units/hr reCheck heparin level daily w/ AM labs while therapeutic Continue to monitor H&H and platelets daily while on heparin infusion    Otelia Sergeant, PharmD, [facility] 12/22/2022 1:08 AM

## 2022-12-22 NOTE — H&P (View-Only) (Signed)
Patient Name: Darren Rogers Date of Encounter: 12/22/2022 [facility] Cardiologist: Lorine Bears, MD   Interval Summary  .    Feels well this morning without chest pain, shortness of breath, palpitations, or lightheadedness.  Vital Signs .    Vitals:   12/22/22 0200 12/22/22 0400 12/22/22 0505 12/22/22 0600  BP: (!) 163/92 (!) 149/104 (!) 149/98 (!) 161/108  Pulse: 98 (!) 55 80 (!) 47  Resp: 17 20 20 14   Temp:      TempSrc:      SpO2: 95% 95% 97% 99%  Weight:      Height:        Intake/Output Summary (Last 24 hours) at 12/22/2022 0905 Last data filed at 12/21/2022 2210 Gross per 24 hour  Intake --  Output 750 ml  Net -750 ml      12/20/2022    5:46 PM 09/20/2022    5:28 AM 08/07/2022    5:31 PM  Last 3 Weights  Weight (lbs) 154 lb 15.7 oz 155 lb 163 lb  Weight (kg) 70.3 kg 70.308 kg 73.936 kg      Telemetry/ECG    Atrial fibrillation with PVCs and NSVT versus aberrancy.  Ventricular rates 100-150 bpm - Personally Reviewed  Physical Exam .   GEN: No acute distress.   Neck: Difficult to assess JVP due to body habitus and position. Cardiac: Tachycardic and irregularly irregular without murmurs. Respiratory: Mildly diminished breath sounds throughout without wheezes or crackles. GI: Soft, nontender, non-distended  MS: No lower extremity edema  Assessment & Plan .     NSTEMI: Patient presented with chest pain but is now chest pain-free.  Mild troponin bump noted in the setting of acute HFrEF and atrial fibrillation with rapid ventricular response.  Prior ischemic workups, most recently in 2022 with MPI, have not shown obstructive CAD. -Continue heparin infusion pending catheterization. -Potential right/left heart catheterization tomorrow if rate control improved.  Acute HFrEF: Volume status appears to have improved with resolution of dyspnea and leg edema.  EF moderately reduced at 35-40%.  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  Patient received a single dose of furosemide 20 mg IV yesterday. -Start furosemide 20 mg p.o. daily. -Escalate metoprolol to tartrate to 37. 5 mg every 6 hours for rate control.  Plan to transition to an evidence-based beta-blocker prior to discharge. -Continue digoxin for rate control and HF therapy. -Start losartan 12.5 mg daily. -Postpone right/left heart catheterization until at least tomorrow due to suboptimal ventricular rate control.  Chronic atrial fibrillation: Ventricular rates remain poorly controlled (100-150 bpm).  Patient received 2 doses of IV digoxin overnight.  He is also on metoprolol tartrate 25 mg every 6 hours. -Increase metoprolol to tartrate to 37.5 mg every 6 hours. -Begin digoxin 125 mcg p.o. daily, first dose this afternoon.  Check digoxin level in 2 days. -Continue IV heparin. -If rate control remains challenging, may need to involve EP to discuss additional options.  Given longstanding atrial fibrillation and lack of anticoagulation prior to admission, I would like to avoid antiarrhythmics like amiodarone due to cardioembolic risk.  I am not sure that Darren Rogers is a good long-term rhythm control candidate either given his longstanding atrial fibrillation and poor follow-up.  Hypertension: Blood pressure rising today. -Increase metoprolol and add losartan, as above.  For questions or updates, please contact [facility] Please consult [email or web address] for contact info under [facility].     Signed,  Yvonne Kendall, MD

## 2022-12-22 NOTE — Progress Notes (Addendum)
Progress Note   Patient: Darren Rogers ZOX:[id number] DOB: November 30, 1955 DOA: 12/20/2022     2 DOS: the patient was seen and examined on 12/22/2022      Subjective:  Patient seen and examined at bedside this morning Denies chest pain nausea vomiting abdominal pain or respiratory distress Has been seen by cardiologist and being planned for cardiac catheter hopefully by tomorrow       Brief hospital course: From HPI "Darren Rogers is a 67 y.o. 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  He also had shortness of breath and palpitations and admits to not taking his diltiazem as he should as he ran out of refills.  EMS found him with a rate of 192- 210 and administered bolus diltiazem and route and also administered aspirin 325. ED course and data review: On arrival BP 133/88 with heart rate 145, respirations 27 O2 sat 100% on room air and afebrile.  Labs significant for troponin 12> 101 and BNP 283.  Labs otherwise unremarkable.  EKG, personally viewed and interpreted showing A-fib at 145. Chest x-ray no active disease Patient was given a repeat diltiazem bolus of 15 mg and started on a diltiazem infusion with improvement in rate to 114 by the time of request for admission. Due to troponin bump, he was started on heparin bolus and infusion for possible NSTEMI. Chest pain resolved by admission. Hospitalist consulted for admission.  "     Assessment and Plan:   Atrial fibrillation with rapid ventricular response (HCC) Patient required Cardizem infusion on presentation Had been transitioned to oral Cardizem as well as metoprolol Digoxin added by cardiologist today due to poor rate control Continue to monitor  telemetry closely Continue current heparin infusion and will be switched to Eliquis at discharge     NSTEMI  history of CAD - Patient with chest pain and troponin 12>101  Patient being managed for possible NSTEMI given several cardiac risk factors Continue ACS protocol with heparin infusion Continue rosuvastatin Cardiologist planning on possible cardiac cath tomorrow Continue to follow-up on echo report Cardiologist on board and case discussed   Acute heart failure with reduced ejection fraction Echo 03/2021 with EF 55 to 60%.  However repeat echo obtained on 12/21/2022 showed ejection fraction 35 to 40% Continue Lasix Continue metoprolol Patient started on losartan 12.5 mg daily today by cardiologist Monitor input and output Daily weighing   History of cocaine use Urine drug screen negative     Essential hypertension Continue metoprolol     Alcohol use disorder, moderate, dependence (HCC) Continue CIWA protocol -Drinks 240 ounce beers daily Counseled on cutting back   DVT prophylaxis: Heparin infusion   Consults: Cardiology, [facility]   Advance Care Planning:   Code Status: Prior    Family Communication: none   Disposition Plan: Back to previous home environment     Physical Exam: Vitals and nursing note reviewed.  Constitutional:      General: In no acute distress HENT:     Head: Normocephalic and atraumatic.  Cardiovascular:     Rate and Rhythm: Irregularly irregular    Heart sounds: Normal heart sounds.  Pulmonary:     Effort: Pulmonary effort is normal.     Breath sounds: Normal breath sounds.  Abdominal:     Palpations: Abdomen is soft.     Tenderness: There is no abdominal tenderness.  Neurological: Alert and awake no focal deficit    Data Reviewed: I have reviewed the patient's lab results, cardiology documentation, nursing documentation as well as vitals   Vitals:   12/22/22 1337 12/22/22 1341 12/22/22 1528 12/22/22 1624  BP: (!) 158/121   (!)  149/96  Pulse: (!) 128   (!) 115  Resp: (!) 22   (!) 21  Temp:   98.4 F (36.9 C)   TempSrc:   Oral   SpO2: 96%   93%  Weight:  55 kg    Height:         Author: Loyce Dys, MD 12/22/2022 6:01 PM  For on call review [email or web address].

## 2022-12-22 NOTE — Consult Note (Signed)
ANTICOAGULATION CONSULT NOTE  Pharmacy Consult for Heparin Infusion Indication: chest pain/ACS  No Known Allergies  Patient Measurements: Height: 5\' 6"  (167.6 cm) Weight: 70.3 kg (154 lb 15.7 oz) IBW/kg (Calculated) : 63.8 Heparin Dosing Weight: 70.3 kg  Vital Signs: Temp: 98.6 F (37 C) (07/23 0911) Temp Source: Oral (07/23 0911) BP: 138/104 (07/23 1007) Pulse Rate: 102 (07/23 1007)  Labs: Recent Labs    12/20/22 1749 12/20/22 1749 12/20/22 1953 12/20/22 1954 12/20/22 2144 12/20/22 2147 12/21/22 0625 12/21/22 1015 12/21/22 1517 12/22/22 0030 12/22/22 0900  HGB 13.4  --   --   --   --   --  12.7* 12.1*  --   --  12.3*  HCT 37.9*  --   --   --   --   --  35.5* 34.4*  --   --  36.3*  PLT 201  --   --   --   --   --  200 203  --   --  213  APTT  --   --   --   --  27  --   --   --   --   --   --   LABPROT  --   --   --   --  14.0  --   --   --   --   --   --   INR  --   --   --   --  1.1  --   --   --   --   --   --   HEPARINUNFRC  --    < >  --   --  <0.10*  --  0.77*  --  0.47 0.45  --   CREATININE  --   --   --  0.82  --   --   --  0.92  --   --  1.10  TROPONINIHS 12  --  101*  --   --  197*  --   --   --   --   --    < > = values in this interval not displayed.    Estimated Creatinine Clearance: 58.8 mL/min (by C-G formula based on SCr of 1.1 mg/dL).   Medical History: Past Medical History:  Diagnosis Date   Claudication (HCC)    a. 06/2015 ABI: R - 0.73, L - 0.73. 30-49% bilat SFA stenosis. 50-74% R Profunda stenosis; b. 01/2017 ABI: R 0.89, L 0.88, TBI R 0.65, L 1.0.   Erectile dysfunction    Essential hypertension    History of echocardiogram    a. 03/29/2014: EF 55-60%, mild LVH, normal RVSP;  b. 05/2015 Echo: EF 60-65%, triv AI, mild MR; c. 03/2021 Echo: EF 55-60%, no rwma, mild LVH, nl RV fxn, mildly dil LA, mild MR.   Hyperlipidemia    Non-obstructive CAD    a. 01/27/2014 Cath: LM nl, LAD mild diff dz w/o obs, LCX no sig obs - scattered 20-30% mLCx,  RCA no obs dz, EF 70%; b. 06/2015 MV; EF 60%, no ischemia; c. 07/2020 MV: No ischemia/scar.   Persistent atrial fibrillation (HCC)    a. Pt says Dx >18yrs ago w/ ? RFCA @ [facility];  b. Recurrent 12/2013;  b. Rx Flecainide and Xarelto-->Intermittent compliance; c. 03/2021 Zio: 100% Afib w/ avg rate of 99 bpm. Rare PVCs.   PVC's (premature ventricular contractions)    a. 05/2017 Zio: 4000 PVCs in 48 hrs (2%). Brief run of  SVT.   Tobacco abuse    a. ongoing - 1 ppd.    Assessment: Darren Rogers is a 67 y.o. male presenting with chest pain. PMH significant for AF, TUD, CAD, PAD, PSA, GERD. Patient is prescribed apixaban but has not been taking it due to frequent nose bleeds per ED RN. Last dose of apixaban was 5 days ago per ED RN. Pharmacy has been consulted to initiate and manage heparin infusion.   Baseline Labs: aPTT 27, HL <0.10, PT 14.0, INR 1.1, Hgb 13.4, Hct 37.9, Plt 201   Goal of Therapy:  Heparin level 0.3-0.7 units/ml aPTT 66-102 seconds Monitor platelets by anticoagulation protocol: Yes   Plan:  HL 0.34, therapeutic  Continue heparin infusion at 750 units/hr Check heparin level daily with AM labs while therapeutic Continue to monitor H&H and platelets daily while on heparin infusion    Celene Squibb, PharmD Clinical Pharmacist 12/22/2022 10:18 AM

## 2022-12-22 NOTE — Progress Notes (Signed)
Patient Name: Darren Rogers Date of Encounter: 12/22/2022 [facility] Cardiologist: Lorine Bears, MD   Interval Summary  .    Feels well this morning without chest pain, shortness of breath, palpitations, or lightheadedness.  Vital Signs .    Vitals:   12/22/22 0200 12/22/22 0400 12/22/22 0505 12/22/22 0600  BP: (!) 163/92 (!) 149/104 (!) 149/98 (!) 161/108  Pulse: 98 (!) 55 80 (!) 47  Resp: 17 20 20 14   Temp:      TempSrc:      SpO2: 95% 95% 97% 99%  Weight:      Height:        Intake/Output Summary (Last 24 hours) at 12/22/2022 0905 Last data filed at 12/21/2022 2210 Gross per 24 hour  Intake --  Output 750 ml  Net -750 ml      12/20/2022    5:46 PM 09/20/2022    5:28 AM 08/07/2022    5:31 PM  Last 3 Weights  Weight (lbs) 154 lb 15.7 oz 155 lb 163 lb  Weight (kg) 70.3 kg 70.308 kg 73.936 kg      Telemetry/ECG    Atrial fibrillation with PVCs and NSVT versus aberrancy.  Ventricular rates 100-150 bpm - Personally Reviewed  Physical Exam .   GEN: No acute distress.   Neck: Difficult to assess JVP due to body habitus and position. Cardiac: Tachycardic and irregularly irregular without murmurs. Respiratory: Mildly diminished breath sounds throughout without wheezes or crackles. GI: Soft, nontender, non-distended  MS: No lower extremity edema  Assessment & Plan .     NSTEMI: Patient presented with chest pain but is now chest pain-free.  Mild troponin bump noted in the setting of acute HFrEF and atrial fibrillation with rapid ventricular response.  Prior ischemic workups, most recently in 2022 with MPI, have not shown obstructive CAD. -Continue heparin infusion pending catheterization. -Potential right/left heart catheterization tomorrow if rate control improved.  Acute HFrEF: Volume status appears to have improved with resolution of dyspnea and leg edema.  EF moderately reduced at 35-40%.  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  Patient received a single dose of furosemide 20 mg IV yesterday. -Start furosemide 20 mg p.o. daily. -Escalate metoprolol to tartrate to 37. 5 mg every 6 hours for rate control.  Plan to transition to an evidence-based beta-blocker prior to discharge. -Continue digoxin for rate control and HF therapy. -Start losartan 12.5 mg daily. -Postpone right/left heart catheterization until at least tomorrow due to suboptimal ventricular rate control.  Chronic atrial fibrillation: Ventricular rates remain poorly controlled (100-150 bpm).  Patient received 2 doses of IV digoxin overnight.  He is also on metoprolol tartrate 25 mg every 6 hours. -Increase metoprolol to tartrate to 37.5 mg every 6 hours. -Begin digoxin 125 mcg p.o. daily, first dose this afternoon.  Check digoxin level in 2 days. -Continue IV heparin. -If rate control remains challenging, may need to involve EP to discuss additional options.  Given longstanding atrial fibrillation and lack of anticoagulation prior to admission, I would like to avoid antiarrhythmics like amiodarone due to cardioembolic risk.  I am not sure that [name] is a good long-term rhythm control candidate either given his longstanding atrial fibrillation and poor follow-up.  Hypertension: Blood pressure rising today. -Increase metoprolol and add losartan, as above.  For questions or updates, please contact [facility] Please consult [email or web address] for contact info under [facility].     Signed,  Yvonne Kendall, MD

## 2022-12-23 ENCOUNTER — Encounter: Payer: Self-pay | Admitting: Anesthesiology

## 2022-12-23 ENCOUNTER — Encounter
Admission: EM | Disposition: A | Discharge status: Home / Self Care | Payer: Self-pay | Source: Home / Self Care | Attending: Osteopathic Medicine

## 2022-12-23 DIAGNOSIS — I482 Chronic atrial fibrillation, unspecified: Secondary | ICD-10-CM | POA: Diagnosis not present

## 2022-12-23 DIAGNOSIS — F1020 Alcohol dependence, uncomplicated: Secondary | ICD-10-CM | POA: Diagnosis not present

## 2022-12-23 DIAGNOSIS — I10 Essential (primary) hypertension: Secondary | ICD-10-CM

## 2022-12-23 DIAGNOSIS — R7989 Other specified abnormal findings of blood chemistry: Secondary | ICD-10-CM

## 2022-12-23 DIAGNOSIS — I739 Peripheral vascular disease, unspecified: Secondary | ICD-10-CM

## 2022-12-23 DIAGNOSIS — I5021 Acute systolic (congestive) heart failure: Secondary | ICD-10-CM

## 2022-12-23 DIAGNOSIS — I2510 Atherosclerotic heart disease of native coronary artery without angina pectoris: Secondary | ICD-10-CM | POA: Diagnosis not present

## 2022-12-23 DIAGNOSIS — I214 Non-ST elevation (NSTEMI) myocardial infarction: Secondary | ICD-10-CM | POA: Diagnosis not present

## 2022-12-23 HISTORY — PX: RIGHT/LEFT HEART CATH AND CORONARY ANGIOGRAPHY: CATH118266

## 2022-12-23 LAB — BASIC METABOLIC PANEL
Anion gap: 8 (ref 5–15)
BUN: 10 mg/dL (ref 8–23)
CO2: 24 mmol/L (ref 22–32)
Calcium: 8.9 mg/dL (ref 8.9–10.3)
Chloride: 101 mmol/L (ref 98–111)
Creatinine, Ser: 0.9 mg/dL (ref 0.61–1.24)
GFR, Estimated: >60 mL/min (ref >60)
Glucose, Bld: 96 mg/dL (ref 70–99)
Potassium: 4.4 mmol/L (ref 3.5–5.1)
Sodium: 133 mmol/L — ABNORMAL LOW (ref 135–145)

## 2022-12-23 LAB — POCT I-STAT EG7
Acid-base deficit: 2 mmol/L (ref 0.0–2.0)
Bicarbonate: 22.7 mmol/L (ref 20.0–28.0)
Calcium, Ion: 1.14 mmol/L — ABNORMAL LOW (ref 1.15–1.40)
HCT: 38 % — ABNORMAL LOW (ref 39.0–52.0)
Hemoglobin: 12.9 g/dL — ABNORMAL LOW (ref 13.0–17.0)
O2 Saturation: 59 %
Potassium: 3.6 mmol/L (ref 3.5–5.1)
Sodium: 135 mmol/L (ref 135–145)
TCO2: 24 mmol/L (ref 22–32)
pCO2, Ven: 37.9 mmHg — ABNORMAL LOW (ref 44–60)
pH, Ven: 7.385 (ref 7.25–7.43)
pO2, Ven: 31 mmHg — CL (ref 32–45)

## 2022-12-23 LAB — CBC WITH DIFFERENTIAL/PLATELET
Abs Immature Granulocytes: 0.01 10*3/uL (ref 0.00–0.07)
Basophils Absolute: 0 10*3/uL (ref 0.0–0.1)
Basophils Relative: 1 %
Eosinophils Absolute: 0 10*3/uL (ref 0.0–0.5)
Eosinophils Relative: 1 %
HCT: 34.8 % — ABNORMAL LOW (ref 39.0–52.0)
Hemoglobin: 12.4 g/dL — ABNORMAL LOW (ref 13.0–17.0)
Immature Granulocytes: 0 %
Lymphocytes Relative: 36 %
Lymphs Abs: 1.6 10*3/uL (ref 0.7–4.0)
MCH: 32.9 pg (ref 26.0–34.0)
MCHC: 35.6 g/dL (ref 30.0–36.0)
MCV: 92.3 fL (ref 80.0–100.0)
Monocytes Absolute: 0.4 10*3/uL (ref 0.1–1.0)
Monocytes Relative: 10 %
Neutro Abs: 2.4 10*3/uL (ref 1.7–7.7)
Neutrophils Relative %: 52 %
Platelets: 221 10*3/uL (ref 150–400)
RBC: 3.77 MIL/uL — ABNORMAL LOW (ref 4.22–5.81)
RDW: 15.9 % — ABNORMAL HIGH (ref 11.5–15.5)
WBC: 4.5 10*3/uL (ref 4.0–10.5)
nRBC: 0 % (ref 0.0–0.2)

## 2022-12-23 LAB — HEPARIN LEVEL (UNFRACTIONATED)
Heparin Unfractionated: 0.35 IU/mL (ref 0.30–0.70)
Heparin Unfractionated: 0.31 IU/mL (ref 0.30–0.70)

## 2022-12-23 LAB — LIPOPROTEIN A (LPA): Lipoprotein (a): 235.2 nmol/L — ABNORMAL HIGH (ref <75.0)

## 2022-12-23 SURGERY — RIGHT/LEFT HEART CATH AND CORONARY ANGIOGRAPHY
Anesthesia: Moderate Sedation

## 2022-12-23 MED ORDER — IOHEXOL 300 MG/ML  SOLN
INTRAMUSCULAR | Status: DC | PRN
  Administered 2022-12-23: 34 mL

## 2022-12-23 MED ORDER — LIDOCAINE HCL (PF) 1 % IJ SOLN
INTRAMUSCULAR | Status: DC | PRN
  Administered 2022-12-23 (×2): 2 mL

## 2022-12-23 MED ORDER — CARVEDILOL 25 MG PO TABS: 25.0000 mg | ORAL_TABLET | Freq: Two times a day (BID) | ORAL | Status: DC

## 2022-12-23 MED ORDER — LOSARTAN POTASSIUM 25 MG PO TABS
25.0000 mg | ORAL_TABLET | Freq: Every day | ORAL | Status: DC
  Administered 2022-12-24 – 2022-12-26 (×3): 25 mg via ORAL
  Filled 2022-12-23 (×3): qty 1

## 2022-12-23 MED ORDER — HEPARIN SODIUM (PORCINE) 1000 UNIT/ML IJ SOLN
INTRAMUSCULAR | Status: AC
  Filled 2022-12-23: qty 10

## 2022-12-23 MED ORDER — HYDRALAZINE HCL 20 MG/ML IJ SOLN: 10.0000 mg | INTRAMUSCULAR | Status: AC | PRN

## 2022-12-23 MED ORDER — SODIUM CHLORIDE 0.9 % WEIGHT BASED INFUSION
3.0000 mL/kg/h | INTRAVENOUS | Status: DC
  Administered 2022-12-23: 3 mL/kg/h via INTRAVENOUS

## 2022-12-23 MED ORDER — HEPARIN (PORCINE) IN NACL 1000-0.9 UT/500ML-% IV SOLN
INTRAVENOUS | Status: DC | PRN
  Administered 2022-12-23 (×2): 500 mL

## 2022-12-23 MED ORDER — FUROSEMIDE 20 MG PO TABS
20.0000 mg | ORAL_TABLET | Freq: Every day | ORAL | Status: DC
  Administered 2022-12-23 – 2022-12-24 (×2): 20 mg via ORAL
  Filled 2022-12-23 (×2): qty 1

## 2022-12-23 MED ORDER — METOPROLOL TARTRATE 5 MG/5ML IV SOLN: 5.0000 mg | INTRAVENOUS | Status: DC | PRN

## 2022-12-23 MED ORDER — SODIUM CHLORIDE 0.9 % WEIGHT BASED INFUSION
1.0000 mL/kg/h | INTRAVENOUS | Status: DC
  Administered 2022-12-23: 1 mL/kg/h via INTRAVENOUS

## 2022-12-23 MED ORDER — FENTANYL CITRATE (PF) 100 MCG/2ML IJ SOLN
INTRAMUSCULAR | Status: DC | PRN
  Administered 2022-12-23: 25 ug via INTRAVENOUS

## 2022-12-23 MED ORDER — HEPARIN (PORCINE) 25000 UT/250ML-% IV SOLN
750.0000 [IU]/h | INTRAVENOUS | Status: DC
  Administered 2022-12-23: 750 [IU]/h via INTRAVENOUS
  Filled 2022-12-23: qty 250

## 2022-12-23 MED ORDER — METOPROLOL TARTRATE 25 MG PO TABS: 37.5000 mg | ORAL_TABLET | Freq: Four times a day (QID) | ORAL | Status: DC

## 2022-12-23 MED ORDER — MIDAZOLAM HCL 2 MG/2ML IJ SOLN
INTRAMUSCULAR | Status: DC | PRN
  Administered 2022-12-23: 1 mg via INTRAVENOUS

## 2022-12-23 MED ORDER — ASPIRIN 81 MG PO CHEW
81.0000 mg | CHEWABLE_TABLET | ORAL | Status: AC
  Administered 2022-12-23: 81 mg via ORAL
  Filled 2022-12-23: qty 1

## 2022-12-23 MED ORDER — SODIUM CHLORIDE 0.9% FLUSH: 3.0000 mL | INTRAVENOUS | Status: DC | PRN

## 2022-12-23 MED ORDER — SODIUM CHLORIDE 0.9% FLUSH
3.0000 mL | Freq: Two times a day (BID) | INTRAVENOUS | Status: DC
  Administered 2022-12-23 – 2022-12-24 (×2): 3 mL via INTRAVENOUS

## 2022-12-23 MED ORDER — METOPROLOL TARTRATE 50 MG PO TABS
50.0000 mg | ORAL_TABLET | Freq: Four times a day (QID) | ORAL | Status: AC
  Administered 2022-12-23: 50 mg via ORAL
  Filled 2022-12-23: qty 1

## 2022-12-23 MED ORDER — CARVEDILOL 25 MG PO TABS
25.0000 mg | ORAL_TABLET | Freq: Two times a day (BID) | ORAL | Status: DC
  Administered 2022-12-23 – 2022-12-24 (×2): 25 mg via ORAL
  Filled 2022-12-23 (×2): qty 1

## 2022-12-23 MED ORDER — VERAPAMIL HCL 2.5 MG/ML IV SOLN
INTRAVENOUS | Status: AC
  Filled 2022-12-23: qty 2

## 2022-12-23 MED ORDER — HEPARIN (PORCINE) IN NACL 1000-0.9 UT/500ML-% IV SOLN
INTRAVENOUS | Status: AC
  Filled 2022-12-23: qty 1000

## 2022-12-23 MED ORDER — LIDOCAINE HCL 1 % IJ SOLN
INTRAMUSCULAR | Status: AC
  Filled 2022-12-23: qty 20

## 2022-12-23 MED ORDER — METOPROLOL TARTRATE 5 MG/5ML IV SOLN: 5.0000 mg | Freq: Once | INTRAVENOUS | Status: DC

## 2022-12-23 MED ORDER — VERAPAMIL HCL 2.5 MG/ML IV SOLN
INTRAVENOUS | Status: DC | PRN
  Administered 2022-12-23 (×2): 2.5 mg via INTRA_ARTERIAL

## 2022-12-23 MED ORDER — FENTANYL CITRATE (PF) 100 MCG/2ML IJ SOLN
INTRAMUSCULAR | Status: AC
  Filled 2022-12-23: qty 2

## 2022-12-23 MED ORDER — MIDAZOLAM HCL 2 MG/2ML IJ SOLN
INTRAMUSCULAR | Status: AC
  Filled 2022-12-23: qty 2

## 2022-12-23 MED ORDER — HEPARIN SODIUM (PORCINE) 1000 UNIT/ML IJ SOLN
INTRAMUSCULAR | Status: DC | PRN
  Administered 2022-12-23: 3500 [IU] via INTRAVENOUS

## 2022-12-23 MED ORDER — SODIUM CHLORIDE 0.9 % IV SOLN: 250.0000 mL | INTRAVENOUS | Status: DC | PRN

## 2022-12-23 SURGICAL SUPPLY — 13 items
CATH 5F 110X4 TIG (CATHETERS) IMPLANT
CATH BALLN WEDGE 5F 110CM (CATHETERS) IMPLANT
DEVICE RAD TR BAND REGULAR (VASCULAR PRODUCTS) IMPLANT
DRAPE BRACHIAL (DRAPES) IMPLANT
GLIDESHEATH SLEND SS 6F .021 (SHEATH) IMPLANT
GUIDEWIRE INQWIRE 1.5J.035X260 (WIRE) IMPLANT
INQWIRE 1.5J .035X260CM (WIRE) ×1
KIT SYRINGE INJ CVI SPIKEX1 (MISCELLANEOUS) IMPLANT
PACK CARDIAC CATH (CUSTOM PROCEDURE TRAY) ×1 IMPLANT
PROTECTION STATION PRESSURIZED (MISCELLANEOUS) ×1
SET ATX-X65L (MISCELLANEOUS) IMPLANT
SHEATH GLIDE SLENDER 4/5FR (SHEATH) IMPLANT
STATION PROTECTION PRESSURIZED (MISCELLANEOUS) IMPLANT

## 2022-12-23 NOTE — Plan of Care (Signed)
  Problem: Cardiac: Goal: Ability to achieve and maintain adequate cardiovascular perfusion will improve Outcome: Progressing   Problem: Health Behavior/Discharge Planning: Goal: Ability to safely manage health-related needs after discharge will improve Outcome: Progressing   Problem: Health Behavior/Discharge Planning: Goal: Ability to manage health-related needs will improve Outcome: Progressing   Problem: Clinical Measurements: Goal: Cardiovascular complication will be avoided Outcome: Progressing   Problem: Activity: Goal: Risk for activity intolerance will decrease Outcome: Progressing   Problem: Safety: Goal: Ability to remain free from injury will improve Outcome: Progressing

## 2022-12-23 NOTE — Hospital Course (Addendum)
Iori Gigante is a 67 y.o. 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  He also had shortness of breath and palpitations and admits to not taking his diltiazem as he should as he ran out of refills.   07/21: To ED, admitted to hospitalist service for A-fib/RVR, NSTEMI 07/22: Cardiology saw patient, acute CHF likely due to tachycardia versus alcohol.  Plan for right/left cardiac cath 07/23: Remains tachycardic, deferring cath until tomorrow 07/24: Right/left cardiac catheterization with Dr. Okey Dupre, severe CAD. Advised continue diuresis  07/25: HR still up, carvedilol increased, resume Eliquis and d/c heparin gtt 07/26: rate still high, adjusting to Toprol XL 100 mg bid and d/c Coreg  07/27: BP improved, cardiology considering addition of entresto possibly tomorrow. On po diuretics, maintaining fluid status.  07/28: started Entresto today. Anticipate d/c tomorrow     Consultants:  Cardiology   Procedures: [facility] 12/23/22       ASSESSMENT & PLAN:   Principal Problem:   Chronic atrial fibrillation with RVR (HCC) Active Problems:   NSTEMI (non-ST elevated myocardial infarction) (HCC)   CAD (coronary artery disease)   Essential hypertension   PAD (peripheral artery disease) (HCC)   Rapid atrial fibrillation (HCC)   Alcohol use disorder, moderate, dependence (HCC)   Non-adherence to medical treatment   Acute HFrEF (heart failure with reduced ejection fraction) (HCC)   Elevated troponin   Atrial fibrillation with rapid ventricular response (HCC) CHA2DS2-VASc at least 4  Adjusting rate control, per cardiology have started Toprol XL 100 and has been maintaining fairly well on this  Eliquis 5 mg twice  daily Digoxin, increased to 0.5 mg  Cardiology following Avoid diltiazem w/ cardiomyopathy  If ventricular rates remain difficult to control, may need EP input for further management, query if AV nodal ablation is needed with PPM implantation   Cardiomyopathy with HFrEF and moderately elevated right heart pressures metoprolol, digoxin as above Lasix 20 mg po daily  Losartan d/c in favor of Entresto which was started morning 12/27/22   Daily weights, fluid restriction Cardiology following GDMT - MRA and SGLT2i    NSTEMI  CAD Status post LHC/RHC 12/23/2022 - Noted to have occlusion of distal left circumflex/PL branch with left to left collaterals. No intervention performed Cardiology following S/p heparin infusion Continue ASA, Rosuvastatin, other cardiac medications as able    Essential hypertension BP has been on the lower side her  Meds as above BP improved to normotensive then hypertensive, added Entresto today    History of cocaine use Urine drug screen negative Abstinence encouraged   Alcohol use disorder, moderate, dependence (HCC) Continue CIWA protocol Counseled on cutting back   DVT prophylaxis: Eliquis Pertinent IV fluids/nutrition: No continuous IV fluids Central lines / invasive devices: None  Code Status: Full code ACP documentation reviewed: 12/23/2022, none on file  Current Admission Status: Inpatient TOC needs / Dispo plan: no TOC needs at this time  Barriers to discharge / significant pending items: Continuing gentle diuresis, cardiology following and will need clearance from them prior to discharge, anticipate can send home tomorrow

## 2022-12-23 NOTE — Consult Note (Signed)
ANTICOAGULATION CONSULT NOTE  Pharmacy Consult for Heparin Infusion Indication: chest pain/ACS  No Known Allergies  Patient Measurements: Height: 5\' 6"  (167.6 cm) Weight: 69 kg (152 lb 1.9 oz) IBW/kg (Calculated) : 63.8 Heparin Dosing Weight: 70.3 kg  Vital Signs: Temp: 98.2 F (36.8 C) (07/24 0443) Temp Source: Oral (07/24 0443) BP: 153/109 (07/24 0443) Pulse Rate: 104 (07/24 0443)  Labs: Recent Labs    12/20/22 1749 12/20/22 1953 12/20/22 1954 12/20/22 2144 12/20/22 2147 12/21/22 0625 12/21/22 1015 12/21/22 1517 12/22/22 0030 12/22/22 0900 12/22/22 1243 12/23/22 0522  HGB 13.4  --   --   --   --    < > 12.1*  --   --  12.3*  --  12.4*  HCT 37.9*  --   --   --   --    < > 34.4*  --   --  36.3*  --  34.8*  PLT 201  --   --   --   --    < > 203  --   --  213  --  221  APTT  --   --   --  27  --   --   --   --   --   --   --   --   LABPROT  --   --   --  14.0  --   --   --   --   --   --   --   --   INR  --   --   --  1.1  --   --   --   --   --   --   --   --   HEPARINUNFRC  --   --   --  <0.10*  --    < >  --    < > 0.45  --  0.34 0.35  CREATININE  --   --    < >  --   --   --  0.92  --   --  1.10  --  0.90  TROPONINIHS 12 101*  --   --  197*  --   --   --   --   --   --   --    < > = values in this interval not displayed.    Estimated Creatinine Clearance: 71.9 mL/min (by C-G formula based on SCr of 0.9 mg/dL).   Medical History: Past Medical History:  Diagnosis Date   Claudication (HCC)    a. 06/2015 ABI: R - 0.73, L - 0.73. 30-49% bilat SFA stenosis. 50-74% R Profunda stenosis; b. 01/2017 ABI: R 0.89, L 0.88, TBI R 0.65, L 1.0.   Erectile dysfunction    Essential hypertension    History of echocardiogram    a. 03/29/2014: EF 55-60%, mild LVH, normal RVSP;  b. 05/2015 Echo: EF 60-65%, triv AI, mild MR; c. 03/2021 Echo: EF 55-60%, no rwma, mild LVH, nl RV fxn, mildly dil LA, mild MR.   Hyperlipidemia    Non-obstructive CAD    a. 01/27/2014 Cath: LM nl, LAD  mild diff dz w/o obs, LCX no sig obs - scattered 20-30% mLCx, RCA no obs dz, EF 70%; b. 06/2015 MV; EF 60%, no ischemia; c. 07/2020 MV: No ischemia/scar.   Persistent atrial fibrillation (HCC)    a. Pt says Dx >37yrs ago w/ ? RFCA @ [facility];  b. Recurrent 12/2013;  b. Rx Flecainide and Xarelto-->Intermittent compliance;  c. 03/2021 Zio: 100% Afib w/ avg rate of 99 bpm. Rare PVCs.   PVC's (premature ventricular contractions)    a. 05/2017 Zio: 4000 PVCs in 48 hrs (2%). Brief run of SVT.   Tobacco abuse    a. ongoing - 1 ppd.    Assessment: Darren Rogers is a 67 y.o. male presenting with chest pain. PMH significant for AF, TUD, CAD, PAD, PSA, GERD. Patient is prescribed apixaban but has not been taking it due to frequent nose bleeds per ED RN. Last dose of apixaban was 5 days ago per ED RN. Pharmacy has been consulted to initiate and manage heparin infusion.   Baseline Labs: aPTT 27, HL <0.10, PT 14.0, INR 1.1, Hgb 13.4, Hct 37.9, Plt 201   Goal of Therapy:  Heparin level 0.3-0.7 units/ml aPTT 66-102 seconds Monitor platelets by anticoagulation protocol: Yes   Plan: heparin level therapeutic Continue heparin infusion at 750 units/hr reCheck heparin level daily w/ AM labs while therapeutic Continue to monitor H&H and platelets daily while on heparin infusion    Otelia Sergeant, PharmD, [facility] 12/23/2022 6:10 AM

## 2022-12-23 NOTE — TOC Progression Note (Signed)
Transition of Care [facility]) - Progression Note    Patient Details  Name: Darren Rogers MRN: [id number] Date of Birth: 1955/07/12  Transition of Care [facility]) CM/SW Contact  Truddie Hidden, RN Phone Number: 12/23/2022, 3:10 PM  Clinical Narrative:    TOC assessing for ongoing needs and discharge planning.     Barriers to Discharge: Continued Medical Work up  Expected Discharge Plan and Services       Living arrangements for the past 2 months: Single Family Home                                       Social Determinants of Health (SDOH) Interventions SDOH Screenings   Food Insecurity: No Food Insecurity (12/23/2022)  Housing: Low Risk  (12/23/2022)  Transportation Needs: No Transportation Needs (12/23/2022)  Utilities: Not At Risk (12/23/2022)  Tobacco Use: High Risk (12/20/2022)    Readmission Risk Interventions    12/21/2022    9:13 AM 10/13/2020   11:28 AM  Readmission Risk Prevention Plan  Transportation Screening Complete Complete  PCP or Specialist Appt within 3-5 Days Complete --  HRI or Home Care Consult Complete   Social Work Consult for Recovery Care Planning/Counseling Complete   Palliative Care Screening Not Applicable Not Applicable  Medication Review [employer]) Referral to Pharmacy Complete

## 2022-12-23 NOTE — Care Management Important Message (Signed)
Important Message  Patient Details  Name: Darren Rogers MRN: [id number] Date of Birth: February 27, 1956   Medicare Important Message Given:  Yes     [name] 12/23/2022, 10:59 AM

## 2022-12-23 NOTE — Interval H&P Note (Signed)
History and Physical Interval Note:  12/23/2022 12:07 PM  Darren Rogers  has presented today for surgery, with the diagnosis of cardiomyopathy.  The various methods of treatment have been discussed with the patient and family. After consideration of risks, benefits and other options for treatment, the patient has consented to  Procedure(s): RIGHT/LEFT HEART CATH AND CORONARY ANGIOGRAPHY (N/A) as a surgical intervention.  The patient's history has been reviewed, patient examined, no change in status, stable for surgery.  I have reviewed the patient's chart and labs.  Questions were answered to the patient's satisfaction.    Cath Lab Visit (complete for each Cath Lab visit)  Clinical Evaluation Leading to the Procedure:   ACS: Yes.    Non-ACS:  N/A  Darren Rogers

## 2022-12-23 NOTE — Plan of Care (Signed)
  Problem: Education: Goal: Understanding of cardiac disease, CV risk reduction, and recovery process will improve 12/23/2022 0452 by Rex Kras, RN Outcome: Progressing 12/23/2022 0406 by Rex Kras, RN Outcome: Progressing Goal: Individualized Educational Video(s) Outcome: Progressing   Problem: Activity: Goal: Ability to tolerate increased activity will improve 12/23/2022 0452 by Rex Kras, RN Outcome: Progressing 12/23/2022 0406 by Rex Kras, RN Outcome: Progressing   Problem: Cardiac: Goal: Ability to achieve and maintain adequate cardiovascular perfusion will improve 12/23/2022 0452 by Rex Kras, RN Outcome: Progressing 12/23/2022 0406 by Rex Kras, RN Outcome: Progressing   Problem: Health Behavior/Discharge Planning: Goal: Ability to safely manage health-related needs after discharge will improve Outcome: Progressing   Problem: Cardiac: Goal: Ability to achieve and maintain adequate cardiovascular perfusion will improve 12/23/2022 0452 by Rex Kras, RN Outcome: Progressing 12/23/2022 0406 by Rex Kras, RN Outcome: Progressing   Problem: Health Behavior/Discharge Planning: Goal: Ability to safely manage health-related needs after discharge will improve Outcome: Progressing   Problem: Education: Goal: Understanding of CV disease, CV risk reduction, and recovery process will improve Outcome: Progressing   Problem: Education: Goal: Knowledge of General Education information will improve Description: Including pain rating scale, medication(s)/side effects and non-pharmacologic comfort measures Outcome: Completed/Met   Problem: Clinical Measurements: Goal: Will remain free from infection Outcome: Completed/Met Goal: Respiratory complications will improve Outcome: Completed/Met   Problem: Clinical Measurements: Goal: Will remain free from  infection Outcome: Completed/Met Goal: Respiratory complications will improve Outcome: Completed/Met   Problem: Activity: Goal: Risk for activity intolerance will decrease Outcome: Progressing   Problem: Coping: Goal: Level of anxiety will decrease Outcome: Completed/Met   Problem: Nutrition: Goal: Adequate nutrition will be maintained Outcome: Completed/Met   Problem: Elimination: Goal: Will not experience complications related to bowel motility Outcome: Completed/Met   Problem: Coping: Goal: Level of anxiety will decrease Outcome: Completed/Met   Problem: Elimination: Goal: Will not experience complications related to bowel motility Outcome: Completed/Met   Problem: Pain Managment: Goal: General experience of comfort will improve Outcome: Completed/Met   Problem: Safety: Goal: Ability to remain free from injury will improve Outcome: Completed/Met   Problem: Skin Integrity: Goal: Risk for impaired skin integrity will decrease Outcome: Completed/Met   Problem: Education: Goal: Knowledge of General Education information will improve Description: Including pain rating scale, medication(s)/side effects and non-pharmacologic comfort measures Outcome: Completed/Met   Problem: Health Behavior/Discharge Planning: Goal: Ability to manage health-related needs will improve Outcome: Progressing   Problem: Clinical Measurements: Goal: Ability to maintain clinical measurements within normal limits will improve Outcome: Progressing Goal: Will remain free from infection Outcome: Completed/Met Goal: Diagnostic test results will improve Outcome: Progressing Goal: Respiratory complications will improve Outcome: Completed/Met   Problem: Clinical Measurements: Goal: Will remain free from infection Outcome: Completed/Met   Problem: Clinical Measurements: Goal: Diagnostic test results will improve Outcome: Progressing   Problem: Clinical Measurements: Goal: Respiratory  complications will improve Outcome: Completed/Met

## 2022-12-23 NOTE — Consult Note (Signed)
ANTICOAGULATION CONSULT NOTE  Pharmacy Consult for Heparin Infusion Indication: Afib  No Known Allergies  Patient Measurements: Height: 5\' 6"  (167.6 cm) Weight: 69 kg (152 lb 1.9 oz) IBW/kg (Calculated) : 63.8 Heparin Dosing Weight: 70.3 kg  Vital Signs: Temp: 98.3 F (36.8 C) (07/24 2010) Temp Source: Oral (07/24 2010) BP: 137/95 (07/24 2015) Pulse Rate: 102 (07/24 2015)  Labs: Recent Labs    12/21/22 1015 12/21/22 1517 12/22/22 0900 12/22/22 1243 12/23/22 0522 12/23/22 1225 12/23/22 2154  HGB 12.1*  --  12.3*  --  12.4* 12.9*  --   HCT 34.4*  --  36.3*  --  34.8* 38.0*  --   PLT 203  --  213  --  221  --   --   HEPARINUNFRC  --    < >  --  0.34 0.35  --  0.31  CREATININE 0.92  --  1.10  --  0.90  --   --    < > = values in this interval not displayed.    Estimated Creatinine Clearance: 71.9 mL/min (by C-G formula based on SCr of 0.9 mg/dL).   Medical History: Past Medical History:  Diagnosis Date   Claudication (HCC)    a. 06/2015 ABI: R - 0.73, L - 0.73. 30-49% bilat SFA stenosis. 50-74% R Profunda stenosis; b. 01/2017 ABI: R 0.89, L 0.88, TBI R 0.65, L 1.0.   Erectile dysfunction    Essential hypertension    History of echocardiogram    a. 03/29/2014: EF 55-60%, mild LVH, normal RVSP;  b. 05/2015 Echo: EF 60-65%, triv AI, mild MR; c. 03/2021 Echo: EF 55-60%, no rwma, mild LVH, nl RV fxn, mildly dil LA, mild MR.   Hyperlipidemia    Non-obstructive CAD    a. 01/27/2014 Cath: LM nl, LAD mild diff dz w/o obs, LCX no sig obs - scattered 20-30% mLCx, RCA no obs dz, EF 70%; b. 06/2015 MV; EF 60%, no ischemia; c. 07/2020 MV: No ischemia/scar.   Persistent atrial fibrillation (HCC)    a. Pt says Dx >71yrs ago w/ ? RFCA @ [facility];  b. Recurrent 12/2013;  b. Rx Flecainide and Xarelto-->Intermittent compliance; c. 03/2021 Zio: 100% Afib w/ avg rate of 99 bpm. Rare PVCs.   PVC's (premature ventricular contractions)    a. 05/2017 Zio: 4000 PVCs in 48 hrs (2%). Brief run of SVT.    Tobacco abuse    a. ongoing - 1 ppd.    Assessment: Darren Rogers is a 67 y.o. male presenting with chest pain. PMH significant for AF, TUD, CAD, PAD, PSA, GERD. Patient is prescribed apixaban but has not been taking it due to frequent nose bleeds per ED RN. Last dose of apixaban was 5 days ago per ED RN. Pharmacy has been consulted to initiate and manage heparin infusion.   Baseline Labs: aPTT 27, HL <0.10, PT 14.0, INR 1.1, Hgb 13.4, Hct 37.9, Plt 201   Goal of Therapy:  Heparin level 0.3-0.7 units/ml aPTT 66-102 seconds Monitor platelets by anticoagulation protocol: Yes   Plan: heparin level therapeutic Continue heparin infusion at 750 units/hr reCheck heparin level daily w/ AM labs while therapeutic Continue to monitor H&H and platelets daily while on heparin infusion    ______________________________________________________________________________ 12/23/22 1500 Update  Patient is returning from cath lab, MD has consulted pharmacy to restart heparin infusion 2 hours after TR-band removal. Per chart review, TR band was removed at 1415.  7/24 2154 HL 0.31, therapeutic x 1  Plan: Continue heparin  infusion at 750 units/hour Recheck HL w/ AM labs to confirm Monitor daily Anti-Xa levels while on heparin Monitor H&H and platelets daily while on heparin  Thank you for involving pharmacy in this patient's care.   Otelia Sergeant, PharmD, MBA 12/23/2022 10:30 PM

## 2022-12-23 NOTE — Progress Notes (Signed)
Patient Name: Darren Rogers Date of Encounter: 12/23/2022 [facility] Cardiologist: Lorine Bears, MD   Interval Summary  .    No chest pain, dyspnea, or palpitations. Ventricular rates in the 110s to 120s, in Afib. BP elevated in the 140s to 160s mmHg systolic. NPO for [facility] today.   Vital Signs .    Vitals:   12/23/22 0400 12/23/22 0443 12/23/22 0834 12/23/22 0922  BP:  (!) 153/109 (!) 149/105 (!) 159/101  Pulse:  (!) 104 (!) 124 60  Resp:  18 20 19   Temp:  98.2 F (36.8 C) 98 F (36.7 C) 98.2 F (36.8 C)  TempSrc:  Oral  Oral  SpO2:  100% 99% 100%  Weight: 69 kg     Height:        Intake/Output Summary (Last 24 hours) at 12/23/2022 0954 Last data filed at 12/23/2022 0933 Gross per 24 hour  Intake --  Output 850 ml  Net -850 ml      12/23/2022    4:00 AM 12/22/2022    1:41 PM 12/20/2022    5:46 PM  Last 3 Weights  Weight (lbs) 152 lb 1.9 oz 121 lb 4.1 oz 154 lb 15.7 oz  Weight (kg) 69 kg 55 kg 70.3 kg      Telemetry/ECG    Atrial fibrillation with ventricular rates 110s -120s bpm - Personally Reviewed  Physical Exam .   GEN: No acute distress.   Neck: Difficult to assess JVP due to body habitus and position. Cardiac: Tachycardic and irregularly irregular without murmurs. Respiratory: Mildly diminished breath sounds throughout without wheezes or crackles. GI: Soft, nontender, non-distended  MS: No lower extremity edema  Assessment & Plan .     NSTEMI: Patient presented with chest pain but is now chest pain-free.  Mild troponin bump noted in the setting of acute HFrEF and atrial fibrillation with rapid ventricular response.  Prior ischemic workups, most recently in 2022 with MPI, have not shown obstructive CAD. -Continue heparin infusion pending catheterization. -Planning for right/left heart cath today, if rate control improved.  Acute HFrEF: Volume status appears to have improved with resolution of dyspnea and leg edema.  EF moderately reduced  at 35-40%.  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  Patient received a single dose of furosemide 20 mg IV on 7/22. -Start furosemide 20 mg p.o. daily. -Give IV metoprolol 5 mg once now in preparation for R/LHC. -Conitnue Lopressor 27.5 mg every 6 hours for today with plans to transition to Coreg tonight. -Continue digoxin for rate control and HF therapy. -Continue losartan 12.5 mg daily, look to transition to ARNI prior to discharge. -Escalate GDMT as able.  -Right/left heart catheterization today, pending ventricular rate control.  Chronic atrial fibrillation: Ventricular rates remain suboptimally controlled (110s -120s bpm), though are improving.   -IV metoprolol prior to [facility] . -Continue Lopressor 27.5 mg every 6 hours for today with transition to Coreg tonight to optimize GDMT. -Continue IV heparin. -Has been intermittently compliant with OAC as an outpatient.  -CHADS2VASc at least 4 (CHF, HTN, age x 1, vascular disease). -If rate control remains challenging, may need to involve EP to discuss additional options.  Given longstanding atrial fibrillation and lack of anticoagulation prior to admission, prefer to avoid antiarrhythmics like amiodarone due to cardioembolic risk.  Not sure that Mr. Champeau is a good long-term rhythm control candidate either given his longstanding atrial fibrillation and poor follow-up.  Hypertension: Blood pressure  elevated. -Medications as above.   For questions or updates, please contact [facility] Please consult [email or web address] for contact info under [facility].     Signed, Eula Listen, PA-C

## 2022-12-23 NOTE — Progress Notes (Signed)
PROGRESS NOTE    Darren Rogers   ZOX:[id number] DOB: June 18, 1955  DOA: 12/20/2022 Date of Service: 12/23/22 PCP: Lyndon Code, MD     Brief Narrative / Hospital Course:  Darren Rogers is a 67 y.o. 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  He also had shortness of breath and palpitations and admits to not taking his diltiazem as he should as he ran out of refills.   07/21: To ED, admitted to hospitalist service for A-fib/RVR, NSTEMI 07/22: Cardiology saw patient, acute CHF likely due to tachycardia versus alcohol.  Plan for right/left cardiac cath 07/23: Remains tachycardic, deferring cath until tomorrow 07/24: Right/left cardiac catheterization with Dr. Okey Dupre, severe CAD  Consultants:  Cardiology   Procedures: [facility] 12/23/22       ASSESSMENT & PLAN:   Principal Problem:   Chronic atrial fibrillation with RVR (HCC) Active Problems:   NSTEMI (non-ST elevated myocardial infarction) (HCC)   CAD (coronary artery disease)   Essential hypertension   PAD (peripheral artery disease) (HCC)   Rapid atrial fibrillation (HCC)   Alcohol use disorder, moderate, dependence (HCC)   Non-adherence to medical treatment   Acute HFrEF (heart failure with reduced ejection fraction) (HCC)   Elevated troponin   Atrial fibrillation with rapid ventricular response (HCC) Transition from metoprolol to carvedilol 25 mg twice daily Continuing heparin infusion Plan transition back to Eliquis 5 mg twice daily at discharge Continuing digoxin, levels tomorrow Cardiology following  Cardiomyopathy with HFrEF and moderately elevated right heart pressures Carvedilol, losartan, digoxin, Lasix  Weights, fluid  restriction Cardiology following   NSTEMI  CAD Status post [facility] 12/23/2022  Cardiology following continuing heparin infusion Rosuvastatin Other medications as above   Essential hypertension Carvedilol 25 mg twice daily Lasix 20 mg daily Losartan 25 mg daily Cardiology following   History of cocaine use Urine drug screen negative   Alcohol use disorder, moderate, dependence (HCC) Continue CIWA protocol Counseled on cutting back   DVT prophylaxis: Heparin infusion to DC per cardiology and anticipate switch back to Eliquis Pertinent IV fluids/nutrition: No continuous IV fluids Central lines / invasive devices: None  Code Status: Full code ACP documentation reviewed: 12/23/2022, none on file  Current Admission Status: Inpatient TOC needs / Dispo plan: Anticipate may need home health Barriers to discharge / significant pending items: Continuing gentle diuresis, cardiology following and will need clearance from them prior to discharge, hopefully can send home in the next couple of days             Subjective / Brief ROS:  Patient reports no concerns, examined in special procedures recovery following cardiac cath, he is concerned that the Lasix is going to have to make him pee a lot and this is his greatest complaint Denies CP/SOB.  Pain controlled.  Denies new weakness.  Tolerating diet.    Family Communication: None at this time    Objective Findings:  Vitals:   12/23/22 1400 12/23/22 1415 12/23/22 1515 12/23/22 1554  BP: (!) 139/94 (!) 133/107 117/70 101/64  Pulse: 97 81 94 76  Resp: 18 17 (!) 21 19  Temp:    97.6 F (36.4 C)  TempSrc:      SpO2: 96% 96% 99% 97%  Weight:  Height:        Intake/Output Summary (Last 24 hours) at 12/23/2022 1640 Last data filed at 12/23/2022 1515 Gross per 24 hour  Intake 480 ml  Output 1751 ml  Net -1271 ml   Filed Weights   12/20/22 1746 12/22/22 1341 12/23/22 0400  Weight: 70.3 kg 55 kg 69 kg     Examination:  Physical Exam Constitutional:      General: He is not in acute distress.    Appearance: He is obese.  Cardiovascular:     Rate and Rhythm: Normal rate and regular rhythm.     Heart sounds: Heart sounds are distant.  Pulmonary:     Effort: Pulmonary effort is normal. No respiratory distress.     Breath sounds: Examination of the right-middle field reveals rales. Examination of the right-lower field reveals rales. Examination of the left-lower field reveals rales. Rales present.  Skin:    General: Skin is warm and dry.  Neurological:     Mental Status: He is alert.          Scheduled Medications:   aspirin EC  81 mg Oral Daily   atorvastatin  40 mg Oral Daily   carvedilol  25 mg Oral BID WC   digoxin  0.125 mg Oral Daily   folic acid  1 mg Oral Daily   furosemide  20 mg Oral Daily   [START ON 12/24/2022] losartan  25 mg Oral Daily   metoprolol tartrate  5 mg Intravenous Once   multivitamin with minerals  1 tablet Oral Daily   pantoprazole  40 mg Oral Daily   sodium chloride flush  3 mL Intravenous Q12H   thiamine  100 mg Oral Daily   Or   thiamine  100 mg Intravenous Daily    Continuous Infusions:  sodium chloride     heparin 750 Units/hr (12/23/22 1619)    PRN Medications:  sodium chloride, acetaminophen, hydrALAZINE, LORazepam **OR** LORazepam, nitroGLYCERIN, ondansetron (ZOFRAN) IV, polyethylene glycol, sodium chloride flush  Antimicrobials from admission:  Anti-infectives (From admission, onward)    None           Data Reviewed:  I have personally reviewed the following...  CBC: Recent Labs  Lab 12/20/22 1749 12/21/22 0625 12/21/22 1015 12/22/22 0900 12/23/22 0522 12/23/22 1225  WBC 3.8* 5.2 4.9 5.4 4.5  --   NEUTROABS 1.9  --  2.8 2.6 2.4  --   HGB 13.4 12.7* 12.1* 12.3* 12.4* 12.9*  HCT 37.9* 35.5* 34.4* 36.3* 34.8* 38.0*  MCV 92.9 92.9 93.2 95.3 92.3  --   PLT 201 200 203 213 221  --    Basic Metabolic  Panel: Recent Labs  Lab 12/20/22 1954 12/21/22 1015 12/22/22 0900 12/23/22 0522 12/23/22 1225  NA 135 135 135 133* 135  K 4.1 4.2 4.6 4.4 3.6  CL 106 105 104 101  --   CO2 20* 22 24 24   --   GLUCOSE 90 161* 92 96  --   BUN <5* 8 12 10   --   CREATININE 0.82 0.92 1.10 0.90  --   CALCIUM 8.4* 8.7* 9.0 8.9  --    GFR: Estimated Creatinine Clearance: 71.9 mL/min (by C-G formula based on SCr of 0.9 mg/dL). Liver Function Tests: No results for input(s): "AST", "ALT", "ALKPHOS", "BILITOT", "PROT", "ALBUMIN" in the last 168 hours. No results for input(s): "LIPASE", "AMYLASE" in the last 168 hours. No results for input(s): "AMMONIA" in the last 168 hours. Coagulation Profile: Recent Labs  Lab 12/20/22 2144  INR 1.1   Cardiac Enzymes: No results for input(s): "CKTOTAL", "CKMB", "CKMBINDEX", "TROPONINI" in the last 168 hours. BNP (last 3 results) No results for input(s): "PROBNP" in the last 8760 hours. HbA1C: No results for input(s): "HGBA1C" in the last 72 hours. CBG: No results for input(s): "GLUCAP" in the last 168 hours. Lipid Profile: Recent Labs    12/22/22 1136  CHOL 193  HDL 64  LDLCALC 114*  TRIG 76  CHOLHDL 3.0   Thyroid Function Tests: No results for input(s): "TSH", "T4TOTAL", "FREET4", "T3FREE", "THYROIDAB" in the last 72 hours. Anemia Panel: No results for input(s): "VITAMINB12", "FOLATE", "FERRITIN", "TIBC", "IRON", "RETICCTPCT" in the last 72 hours. Most Recent Urinalysis On File:     Component Value Date/Time   COLORURINE STRAW (A) 09/20/2022 0534   APPEARANCEUR CLEAR (A) 09/20/2022 0534   APPEARANCEUR Clear 07/30/2016 1600   LABSPEC 1.002 (L) 09/20/2022 0534   LABSPEC 1.019 03/28/2014 1430   PHURINE 6.0 09/20/2022 0534   GLUCOSEU NEGATIVE 09/20/2022 0534   GLUCOSEU Negative 03/28/2014 1430   HGBUR NEGATIVE 09/20/2022 0534   BILIRUBINUR NEGATIVE 09/20/2022 0534   BILIRUBINUR Negative 07/30/2016 1600   BILIRUBINUR Negative 03/28/2014 1430    KETONESUR NEGATIVE 09/20/2022 0534   PROTEINUR NEGATIVE 09/20/2022 0534   NITRITE NEGATIVE 09/20/2022 0534   LEUKOCYTESUR NEGATIVE 09/20/2022 0534   LEUKOCYTESUR Negative 03/28/2014 1430   Sepsis Labs: @LABRCNTIP (procalcitonin:4,lacticidven:4) Microbiology: No results found for this or any previous visit (from the past 240 hour(s)).    Radiology Studies last 3 days: CARDIAC CATHETERIZATION  Result Date: 12/23/2022 Conclusions: Severe single-vessel coronary artery disease with chronic total occlusion of distal LCx/LPLA.  LPDA is supplied by left-to-left collaterals.  There is mild-moderate, non-obstructive disease involving the LAD. Upper normal to mildly elevated left and right heart filling pressures (LVEDP 17 mmHg, PCWP 18 mmHg, RA 7 mmHg). Moderate pulmonary hypertension (mean PA 37 mmHg, PVR 5 WU) Mildly reduced Fick cardiac output/index (CO 3.8 L/min, CI 2.1 L/min/m^2). Recommendations: Continue gentle diuresis. Increase metoprolol tartrate to 50 mg every 6 hours for improved rate control; transition to evidence-based beta blocker prior to discharge. Continue digoxin. Increase losartan to 25 mg daily. Secondary prevention of coronary artery disease; there are no targets for revascularization. Yvonne Kendall, MD [facility]  ECHOCARDIOGRAM COMPLETE  Result Date: 12/21/2022    ECHOCARDIOGRAM REPORT   Patient Name:   Darren Rogers Date of Exam: 12/21/2022 Medical Rec #:  [id number]    Height:       66.0 in Accession #:    [id number]   Weight:       155.0 lb Date of Birth:  1955/12/04   BSA:          1.794 m Patient Age:    66 years     BP:           115/80 mmHg Patient Gender: M            HR:           109 bpm. Exam Location:  [facility] Procedure: 2D Echo, Cardiac Doppler and Color Doppler Indications:     NSTEMI  History:         Patient has prior history of Echocardiogram examinations, most                  recent 03/11/2021. Acute MI and CAD, PAD, Arrythmias:Atrial                   Fibrillation, Signs/Symptoms:Chest  Pain; Risk                  Factors:Hypertension, Dyslipidemia and Current Smoker.                  Polysubstance abuse.  Sonographer:     Mikki Harbor Referring Phys:  [id number] Andris Baumann Diagnosing Phys: Lorine Bears MD IMPRESSIONS  1. Left ventricular ejection fraction, by estimation, is 35 to 40%. The left ventricle has moderately decreased function. The left ventricle demonstrates global hypokinesis. There is mild left ventricular hypertrophy. Left ventricular diastolic parameters are indeterminate.  2. Right ventricular systolic function is normal. The right ventricular size is normal. There is moderately elevated pulmonary artery systolic pressure. The estimated right ventricular systolic pressure is 53.4 mmHg.  3. Left atrial size was mildly dilated.  4. Right atrial size was mildly dilated.  5. The mitral valve is normal in structure. Mild to moderate mitral valve regurgitation. No evidence of mitral stenosis.  6. Tricuspid valve regurgitation is moderate.  7. The aortic valve is normal in structure. Aortic valve regurgitation is mild. No aortic stenosis is present.  8. Pulmonic valve regurgitation is moderate.  9. Aortic dilatation noted. There is mild dilatation of the aortic root, measuring 42 mm. 10. Mildly dilated pulmonary artery. 11. The inferior vena cava is normal in size with <50% respiratory variability, suggesting right atrial pressure of 8 mmHg. FINDINGS  Left Ventricle: Left ventricular ejection fraction, by estimation, is 35 to 40%. The left ventricle has moderately decreased function. The left ventricle demonstrates global hypokinesis. The left ventricular internal cavity size was normal in size. There is mild left ventricular hypertrophy. Left ventricular diastolic parameters are indeterminate. Right Ventricle: The right ventricular size is normal. No increase in right ventricular wall thickness. Right ventricular systolic function is normal.  There is moderately elevated pulmonary artery systolic pressure. The tricuspid regurgitant velocity is 3.37 m/s, and with an assumed right atrial pressure of 8 mmHg, the estimated right ventricular systolic pressure is 53.4 mmHg. Left Atrium: Left atrial size was mildly dilated. Right Atrium: Right atrial size was mildly dilated. Pericardium: There is no evidence of pericardial effusion. Mitral Valve: The mitral valve is normal in structure. Mild to moderate mitral valve regurgitation. No evidence of mitral valve stenosis. MV peak gradient, 4.2 mmHg. The mean mitral valve gradient is 2.0 mmHg. Tricuspid Valve: The tricuspid valve is normal in structure. Tricuspid valve regurgitation is moderate . No evidence of tricuspid stenosis. Aortic Valve: The aortic valve is normal in structure. Aortic valve regurgitation is mild. No aortic stenosis is present. Aortic valve mean gradient measures 2.3 mmHg. Aortic valve peak gradient measures 5.2 mmHg. Aortic valve area, by VTI measures 2.40 cm. Pulmonic Valve: The pulmonic valve was normal in structure. Pulmonic valve regurgitation is moderate. No evidence of pulmonic stenosis. Aorta: Aortic dilatation noted. There is mild dilatation of the aortic root, measuring 42 mm. Pulmonary Artery: The pulmonary artery is mildly dilated. Venous: The inferior vena cava is normal in size with less than 50% respiratory variability, suggesting right atrial pressure of 8 mmHg. IAS/Shunts: No atrial level shunt detected by color flow Doppler.  LEFT VENTRICLE PLAX 2D LVIDd:         4.90 cm LVIDs:         3.90 cm LV PW:         1.20 cm LV IVS:        1.00 cm LVOT diam:     2.00 cm LV SV:  44 LV SV Index:   25 LVOT Area:     3.14 cm  RIGHT VENTRICLE RV Basal diam:  3.70 cm RV Mid diam:    3.20 cm RV S prime:     10.00 cm/s LEFT ATRIUM             Index        RIGHT ATRIUM           Index LA diam:        3.50 cm 1.95 cm/m   RA Area:     20.00 cm LA Vol (A2C):   56.7 ml 31.60 ml/m  RA  Volume:   56.80 ml  31.65 ml/m LA Vol (A4C):   64.2 ml 35.78 ml/m LA Biplane Vol: 63.9 ml 35.61 ml/m  AORTIC VALVE                    PULMONIC VALVE AV Area (Vmax):    2.51 cm     PV Vmax:       0.75 m/s AV Area (Vmean):   2.57 cm     PV Peak grad:  2.2 mmHg AV Area (VTI):     2.40 cm AV Vmax:           114.43 cm/s AV Vmean:          68.900 cm/s AV VTI:            0.185 m AV Peak Grad:      5.2 mmHg AV Mean Grad:      2.3 mmHg LVOT Vmax:         91.35 cm/s LVOT Vmean:        56.350 cm/s LVOT VTI:          0.142 m LVOT/AV VTI ratio: 0.76  AORTA Ao Root diam: 4.30 cm Ao Asc diam:  3.70 cm MITRAL VALVE                TRICUSPID VALVE MV Area (PHT): 6.04 cm     TR Peak grad:   45.4 mmHg MV Area VTI:   2.75 cm     TR Vmax:        337.00 cm/s MV Peak grad:  4.2 mmHg MV Mean grad:  2.0 mmHg     SHUNTS MV Vmax:       1.02 m/s     Systemic VTI:  0.14 m MV Vmean:      58.4 cm/s    Systemic Diam: 2.00 cm MV Decel Time: 126 msec MV E velocity: 109.75 cm/s Lorine Bears MD Electronically signed by Lorine Bears MD Signature Date/Time: 12/21/2022/2:10:48 PM    Final    DG Chest Portable 1 View  Result Date: 12/20/2022 CLINICAL DATA:  Shortness of breath EXAM: PORTABLE CHEST 1 VIEW COMPARISON:  09/20/2022 FINDINGS: Heart is borderline in size. Lungs clear. No effusions or edema. No acute bony abnormality. IMPRESSION: No active disease. Electronically Signed   By: Charlett Nose M.D.   On: 12/20/2022 19:01             LOS: 3 days    Time spent: 50 min    Sunnie Nielsen, DO [facility] 12/23/2022, 4:40 PM    Dictation software may have been used to generate the above note. Typos may occur and escape review in typed/dictated notes. Please contact Dr Lyn Hollingshead directly for clarity if needed.  Staff may message me via secure chat in Epic  but this may not receive an  immediate response,  please page me for urgent matters!  If 7PM-7AM, please contact night coverage [email or web address]

## 2022-12-23 NOTE — Consult Note (Signed)
ANTICOAGULATION CONSULT NOTE  Pharmacy Consult for Heparin Infusion Indication: Afib  No Known Allergies  Patient Measurements: Height: 5\' 6"  (167.6 cm) Weight: 69 kg (152 lb 1.9 oz) IBW/kg (Calculated) : 63.8 Heparin Dosing Weight: 70.3 kg  Vital Signs: Temp: 98.3 F (36.8 C) (07/24 1100) Temp Source: Oral (07/24 1100) BP: 133/107 (07/24 1415) Pulse Rate: 81 (07/24 1415)  Labs: Recent Labs    12/20/22 1749 12/20/22 1953 12/20/22 1954 12/20/22 2144 12/20/22 2147 12/21/22 0625 12/21/22 1015 12/21/22 1517 12/22/22 0030 12/22/22 0900 12/22/22 1243 12/23/22 0522 12/23/22 1225  HGB 13.4  --   --   --   --    < > 12.1*  --   --  12.3*  --  12.4* 12.9*  HCT 37.9*  --   --   --   --    < > 34.4*  --   --  36.3*  --  34.8* 38.0*  PLT 201  --   --   --   --    < > 203  --   --  213  --  221  --   APTT  --   --   --  27  --   --   --   --   --   --   --   --   --   LABPROT  --   --   --  14.0  --   --   --   --   --   --   --   --   --   INR  --   --   --  1.1  --   --   --   --   --   --   --   --   --   HEPARINUNFRC  --   --   --  <0.10*  --    < >  --    < > 0.45  --  0.34 0.35  --   CREATININE  --   --    < >  --   --   --  0.92  --   --  1.10  --  0.90  --   TROPONINIHS 12 101*  --   --  197*  --   --   --   --   --   --   --   --    < > = values in this interval not displayed.    Estimated Creatinine Clearance: 71.9 mL/min (by C-G formula based on SCr of 0.9 mg/dL).   Medical History: Past Medical History:  Diagnosis Date   Claudication (HCC)    a. 06/2015 ABI: R - 0.73, L - 0.73. 30-49% bilat SFA stenosis. 50-74% R Profunda stenosis; b. 01/2017 ABI: R 0.89, L 0.88, TBI R 0.65, L 1.0.   Erectile dysfunction    Essential hypertension    History of echocardiogram    a. 03/29/2014: EF 55-60%, mild LVH, normal RVSP;  b. 05/2015 Echo: EF 60-65%, triv AI, mild MR; c. 03/2021 Echo: EF 55-60%, no rwma, mild LVH, nl RV fxn, mildly dil LA, mild MR.   Hyperlipidemia     Non-obstructive CAD    a. 01/27/2014 Cath: LM nl, LAD mild diff dz w/o obs, LCX no sig obs - scattered 20-30% mLCx, RCA no obs dz, EF 70%; b. 06/2015 MV; EF 60%, no ischemia; c. 07/2020 MV: No ischemia/scar.   Persistent atrial fibrillation (HCC)  a. Pt says Dx >74yrs ago w/ ? RFCA @ [facility];  b. Recurrent 12/2013;  b. Rx Flecainide and Xarelto-->Intermittent compliance; c. 03/2021 Zio: 100% Afib w/ avg rate of 99 bpm. Rare PVCs.   PVC's (premature ventricular contractions)    a. 05/2017 Zio: 4000 PVCs in 48 hrs (2%). Brief run of SVT.   Tobacco abuse    a. ongoing - 1 ppd.    Assessment: Darren Rogers is a 67 y.o. male presenting with chest pain. PMH significant for AF, TUD, CAD, PAD, PSA, GERD. Patient is prescribed apixaban but has not been taking it due to frequent nose bleeds per ED RN. Last dose of apixaban was 5 days ago per ED RN. Pharmacy has been consulted to initiate and manage heparin infusion.   Baseline Labs: aPTT 27, HL <0.10, PT 14.0, INR 1.1, Hgb 13.4, Hct 37.9, Plt 201   Goal of Therapy:  Heparin level 0.3-0.7 units/ml aPTT 66-102 seconds Monitor platelets by anticoagulation protocol: Yes   Plan: heparin level therapeutic Continue heparin infusion at 750 units/hr reCheck heparin level daily w/ AM labs while therapeutic Continue to monitor H&H and platelets daily while on heparin infusion    ______________________________________________________________________________ 12/23/22 1500 Update  Patient is returning from cath lab, MD has consulted pharmacy to restart heparin infusion 2 hours after TR-band removal. Per chart review, TR band was removed at 1415.  Plan: Restart heparin infusion at 750 units/hour at 1615 on 7/24 Check 6-hour Anti-Xa level Monitor daily Anti-Xa levels while on heparin Monitor H&H and platelets daily while on heparin  Thank you for involving pharmacy in this patient's care.   Rockwell Alexandria, PharmD Clinical Pharmacist 12/23/2022 3:05 PM

## 2022-12-24 ENCOUNTER — Other Ambulatory Visit (HOSPITAL_COMMUNITY): Payer: Self-pay

## 2022-12-24 ENCOUNTER — Encounter: Payer: Self-pay | Admitting: Internal Medicine

## 2022-12-24 DIAGNOSIS — Z91199 Patient's noncompliance with other medical treatment and regimen due to unspecified reason: Secondary | ICD-10-CM

## 2022-12-24 DIAGNOSIS — I25118 Atherosclerotic heart disease of native coronary artery with other forms of angina pectoris: Secondary | ICD-10-CM

## 2022-12-24 DIAGNOSIS — I4891 Unspecified atrial fibrillation: Secondary | ICD-10-CM | POA: Diagnosis not present

## 2022-12-24 DIAGNOSIS — I482 Chronic atrial fibrillation, unspecified: Secondary | ICD-10-CM | POA: Diagnosis not present

## 2022-12-24 DIAGNOSIS — F1020 Alcohol dependence, uncomplicated: Secondary | ICD-10-CM | POA: Diagnosis not present

## 2022-12-24 DIAGNOSIS — I5021 Acute systolic (congestive) heart failure: Secondary | ICD-10-CM | POA: Diagnosis not present

## 2022-12-24 LAB — BASIC METABOLIC PANEL
Anion gap: 9 (ref 5–15)
BUN: 15 mg/dL (ref 8–23)
CO2: 23 mmol/L (ref 22–32)
Calcium: 8.8 mg/dL — ABNORMAL LOW (ref 8.9–10.3)
Chloride: 104 mmol/L (ref 98–111)
Creatinine, Ser: 0.98 mg/dL (ref 0.61–1.24)
GFR, Estimated: >60 mL/min (ref >60)
Potassium: 4.2 mmol/L (ref 3.5–5.1)
Sodium: 136 mmol/L (ref 135–145)

## 2022-12-24 LAB — DIGOXIN LEVEL: Digoxin Level: 0.5 ng/mL — ABNORMAL LOW (ref 0.8–2.0)

## 2022-12-24 LAB — POCT I-STAT 7, (LYTES, BLD GAS, ICA,H+H)
Acid-base deficit: 2 mmol/L (ref 0.0–2.0)
Bicarbonate: 21 mmol/L (ref 20.0–28.0)
Calcium, Ion: 1.19 mmol/L (ref 1.15–1.40)
HCT: 38 % — ABNORMAL LOW (ref 39.0–52.0)
Hemoglobin: 12.9 g/dL — ABNORMAL LOW (ref 13.0–17.0)
O2 Saturation: 95 %
Potassium: 3.7 mmol/L (ref 3.5–5.1)
TCO2: 22 mmol/L (ref 22–32)
pCO2 arterial: 31.5 mmHg — ABNORMAL LOW (ref 32–48)
pH, Arterial: 7.431 (ref 7.35–7.45)

## 2022-12-24 LAB — MAGNESIUM: Magnesium: 2.1 mg/dL (ref 1.7–2.4)

## 2022-12-24 MED ORDER — FUROSEMIDE 10 MG/ML IJ SOLN
40.0000 mg | Freq: Once | INTRAMUSCULAR | Status: AC
  Administered 2022-12-24: 40 mg via INTRAVENOUS
  Filled 2022-12-24: qty 4

## 2022-12-24 MED ORDER — CARVEDILOL 25 MG PO TABS
37.5000 mg | ORAL_TABLET | Freq: Two times a day (BID) | ORAL | Status: DC
  Administered 2022-12-24: 37.5 mg via ORAL
  Filled 2022-12-24: qty 1

## 2022-12-24 MED ORDER — APIXABAN 5 MG PO TABS
5.0000 mg | ORAL_TABLET | Freq: Two times a day (BID) | ORAL | Status: DC
  Administered 2022-12-24 – 2022-12-28 (×9): 5 mg via ORAL
  Filled 2022-12-24 (×9): qty 1

## 2022-12-24 MED ORDER — FUROSEMIDE 20 MG PO TABS
20.0000 mg | ORAL_TABLET | Freq: Every day | ORAL | Status: DC
  Administered 2022-12-25: 20 mg via ORAL
  Filled 2022-12-24 (×4): qty 1

## 2022-12-24 MED ORDER — CARVEDILOL 12.5 MG PO TABS
12.5000 mg | ORAL_TABLET | Freq: Once | ORAL | Status: AC
  Administered 2022-12-24: 12.5 mg via ORAL
  Filled 2022-12-24: qty 1

## 2022-12-24 MED ORDER — DIGOXIN 125 MCG PO TABS
0.1250 mg | ORAL_TABLET | Freq: Once | ORAL | Status: AC
  Administered 2022-12-24: 0.125 mg via ORAL
  Filled 2022-12-24: qty 1

## 2022-12-24 MED ORDER — DIGOXIN 250 MCG PO TABS
0.2500 mg | ORAL_TABLET | Freq: Every day | ORAL | Status: DC
  Administered 2022-12-25 – 2022-12-28 (×4): 0.25 mg via ORAL
  Filled 2022-12-24 (×4): qty 1

## 2022-12-24 NOTE — TOC Benefit Eligibility Note (Addendum)
Pharmacy Patient Advocate Encounter  Insurance verification completed.    The patient is insured through [facility] Medicare Part D  Ran test claim for Farxiga 10 mg and the current 30 day co-pay is $0.00.  Ran test claim for Jardiance 10 mg and the current 30 day co-pay is $0.00.  Ran test claim for Eliquis 5 mg and the current 30 day co-pay is $0.00.   This test claim was processed through [facility]- copay amounts may vary at other pharmacies due to pharmacy/plan contracts, or as the patient moves through the different stages of their insurance plan.    Roland Earl, CPHT Pharmacy Patient Advocate Specialist [facility] Patient Advocate Team Direct Number: [phone]  Fax: [phone]

## 2022-12-24 NOTE — Consult Note (Signed)
ANTICOAGULATION CONSULT NOTE  Pharmacy Consult for Heparin Infusion Indication: Afib  No Known Allergies  Patient Measurements: Height: 5\' 6"  (167.6 cm) Weight: 69 kg (152 lb 1.9 oz) IBW/kg (Calculated) : 63.8 Heparin Dosing Weight: 70.3 kg  Vital Signs: Temp: 98 F (36.7 C) (07/25 0337) Temp Source: Oral (07/25 0337) BP: 157/105 (07/25 0337) Pulse Rate: 89 (07/25 0337)  Labs: Recent Labs    12/21/22 1015 12/21/22 1517 12/22/22 0900 12/22/22 1243 12/23/22 0522 12/23/22 1225 12/23/22 2154 12/24/22 0511  HGB 12.1*  --  12.3*  --  12.4* 12.9*  --   --   HCT 34.4*  --  36.3*  --  34.8* 38.0*  --   --   PLT 203  --  213  --  221  --   --   --   HEPARINUNFRC  --    < >  --    < > 0.35  --  0.31 0.45  CREATININE 0.92  --  1.10  --  0.90  --   --  0.98   < > = values in this interval not displayed.    Estimated Creatinine Clearance: 66 mL/min (by C-G formula based on SCr of 0.98 mg/dL).   Medical History: Past Medical History:  Diagnosis Date   Claudication (HCC)    a. 06/2015 ABI: R - 0.73, L - 0.73. 30-49% bilat SFA stenosis. 50-74% R Profunda stenosis; b. 01/2017 ABI: R 0.89, L 0.88, TBI R 0.65, L 1.0.   Erectile dysfunction    Essential hypertension    History of echocardiogram    a. 03/29/2014: EF 55-60%, mild LVH, normal RVSP;  b. 05/2015 Echo: EF 60-65%, triv AI, mild MR; c. 03/2021 Echo: EF 55-60%, no rwma, mild LVH, nl RV fxn, mildly dil LA, mild MR.   Hyperlipidemia    Non-obstructive CAD    a. 01/27/2014 Cath: LM nl, LAD mild diff dz w/o obs, LCX no sig obs - scattered 20-30% mLCx, RCA no obs dz, EF 70%; b. 06/2015 MV; EF 60%, no ischemia; c. 07/2020 MV: No ischemia/scar.   Persistent atrial fibrillation (HCC)    a. Pt says Dx >50yrs ago w/ ? RFCA @ [facility];  b. Recurrent 12/2013;  b. Rx Flecainide and Xarelto-->Intermittent compliance; c. 03/2021 Zio: 100% Afib w/ avg rate of 99 bpm. Rare PVCs.   PVC's (premature ventricular contractions)    a. 05/2017 Zio: 4000  PVCs in 48 hrs (2%). Brief run of SVT.   Tobacco abuse    a. ongoing - 1 ppd.    Assessment: Darren Rogers is a 67 y.o. male presenting with chest pain. PMH significant for AF, TUD, CAD, PAD, PSA, GERD. Patient is prescribed apixaban but has not been taking it due to frequent nose bleeds per ED RN. Last dose of apixaban was 5 days ago per ED RN. Pharmacy has been consulted to initiate and manage heparin infusion.   Baseline Labs: aPTT 27, HL <0.10, PT 14.0, INR 1.1, Hgb 13.4, Hct 37.9, Plt 201   Goal of Therapy:  Heparin level 0.3-0.7 units/ml aPTT 66-102 seconds Monitor platelets by anticoagulation protocol: Yes   Plan: heparin level therapeutic Continue heparin infusion at 750 units/hr reCheck heparin level daily w/ AM labs while therapeutic Continue to monitor H&H and platelets daily while on heparin infusion    ______________________________________________________________________________ 12/23/22 1500 Update  Patient is returning from cath lab, MD has consulted pharmacy to restart heparin infusion 2 hours after TR-band removal. Per chart review, TR band  was removed at 1415.  7/24 2154 HL 0.31, therapeutic x 1 7/25 0511 HL 0.45, therapeutic x 2  Plan: Continue heparin infusion at 750 units/hour Recheck HL w/ AM labs daily while therapeutic Monitor H&H and platelets daily while on heparin  Thank you for involving pharmacy in this patient's care.   Otelia Sergeant, PharmD, [facility] 12/24/2022 5:44 AM

## 2022-12-24 NOTE — Progress Notes (Signed)
PROGRESS NOTE    Darren Rogers   NWG:[id number] DOB: [birth year]  DOA: 12/20/2022 Date of Service: 12/24/22 PCP: Lyndon Code, MD     Brief Narrative / Hospital Course:  Darren Rogers is a 67 y.o. 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  He also had shortness of breath and palpitations and admits to not taking his diltiazem as he should as he ran out of refills.   07/21: To ED, admitted to hospitalist service for A-fib/RVR, NSTEMI 07/22: Cardiology saw patient, acute CHF likely due to tachycardia versus alcohol.  Plan for right/left cardiac cath 07/23: Remains tachycardic, deferring cath until tomorrow 07/24: Right/left cardiac catheterization with Dr. Okey Dupre, severe CAD. Advised continue diuresis  07/25: Net IO Since Admission: -1,921 mL [12/24/22 0923]. HR still up, carvedilol increased, resume Eliquis and off heparin   Consultants:  Cardiology   Procedures: [facility] 12/23/22       ASSESSMENT & PLAN:   Principal Problem:   Chronic atrial fibrillation with RVR (HCC) Active Problems:   NSTEMI (non-ST elevated myocardial infarction) (HCC)   CAD (coronary artery disease)   Essential hypertension   PAD (peripheral artery disease) (HCC)   Rapid atrial fibrillation (HCC)   Alcohol use disorder, moderate, dependence (HCC)   Non-adherence to medical treatment   Acute HFrEF (heart failure with reduced ejection fraction) (HCC)   Elevated troponin   Atrial fibrillation with rapid ventricular response (HCC) Transition from metoprolol to carvedilol 25 mg twice daily yesterday, rate still high so increased to 37.5 mg bid carvedilol  D/c heparin infusion transition to Eliquis 5 mg twice  daily Continuing digoxin, increased to 0.25 today  Cardiology following  Cardiomyopathy with HFrEF and moderately elevated right heart pressures Carvedilol, losartan, digoxin, Lasix  Weights, fluid restriction Cardiology following   NSTEMI  CAD Status post LHC/RHC 12/23/2022 - Noted to have occlusion of distal left circumflex/PL branch with left to left collaterals. No intervention performed Cardiology following continuing heparin infusion Rosuvastatin Other medications as above   Essential hypertension Carvedilol 25 mg twice daily Lasix 20 mg daily Losartan 25 mg daily Cardiology following   History of cocaine use Urine drug screen negative   Alcohol use disorder, moderate, dependence (HCC) Continue CIWA protocol Counseled on cutting back   DVT prophylaxis: Heparin infusion to DC per cardiology and anticipate switch back to Eliquis Pertinent IV fluids/nutrition: No continuous IV fluids Central lines / invasive devices: None  Code Status: Full code ACP documentation reviewed: 12/23/2022, none on file  Current Admission Status: Inpatient TOC needs / Dispo plan: Anticipate may need home health Barriers to discharge / significant pending items: Continuing gentle diuresis, cardiology following and will need clearance from them prior to discharge, hopefully can send home in the next couple of days             Subjective / Brief ROS:  Patient reports no concerns Peed a lot yesterday and he is feeling better less SOB Denies CP.  Pain controlled.  Denies new weakness.  Tolerating diet.    Family Communication: None at this time    Objective Findings:  Vitals:   12/24/22 0337 12/24/22 0826 12/24/22 1232 12/24/22 1648  BP: (!) 157/105 127/65 105/70 117/81  Pulse: 89 99 67 62  Resp: 16 18 18 18   Temp: 98 F (36.7 C) 98.1 F (36.7 C) 97.9 F (36.6 C) 98.5 F (36.9 C)  TempSrc: Oral Oral    SpO2: 99% 100%  97%  Weight:      Height:         Intake/Output Summary (Last 24 hours) at 12/24/2022 1701 Last data filed at 12/24/2022 1652 Gross per 24 hour  Intake 1420 ml  Output 1550 ml  Net -130 ml   Filed Weights   12/20/22 1746 12/22/22 1341 12/23/22 0400  Weight: 70.3 kg 55 kg 69 kg    Examination:  Physical Exam Constitutional:      General: He is not in acute distress.    Appearance: He is obese.  Cardiovascular:     Rate and Rhythm: Normal rate and regular rhythm.     Heart sounds: Heart sounds are distant.  Pulmonary:     Effort: Pulmonary effort is normal. No respiratory distress.     Breath sounds: Examination of the right-lower field reveals rales. Examination of the left-lower field reveals rales. Rales (improved some from yesterday) present.  Skin:    General: Skin is warm and dry.  Neurological:     Mental Status: He is alert.          Scheduled Medications:   apixaban  5 mg Oral BID   aspirin EC  81 mg Oral Daily   atorvastatin  40 mg Oral Daily   carvedilol  37.5 mg Oral BID WC   [START ON 12/25/2022] digoxin  0.25 mg Oral Daily   folic acid  1 mg Oral Daily   [START ON 12/25/2022] furosemide  20 mg Oral Daily   losartan  25 mg Oral Daily   metoprolol tartrate  5 mg Intravenous Once   multivitamin with minerals  1 tablet Oral Daily   pantoprazole  40 mg Oral Daily   sodium chloride flush  3 mL Intravenous Q12H   thiamine  100 mg Oral Daily   Or   thiamine  100 mg Intravenous Daily    Continuous Infusions:  sodium chloride      PRN Medications:  sodium chloride, acetaminophen, nitroGLYCERIN, ondansetron (ZOFRAN) IV, polyethylene glycol, sodium chloride flush  Antimicrobials from admission:  Anti-infectives (From admission, onward)    None           Data Reviewed:  I have personally reviewed the following...  CBC: Recent Labs  Lab 12/20/22 1749 12/21/22 0625 12/21/22 1015 12/22/22 0900 12/23/22 0522 12/23/22 1225 12/23/22 1229  WBC 3.8* 5.2 4.9 5.4 4.5  --    --   NEUTROABS 1.9  --  2.8 2.6 2.4  --   --   HGB 13.4 12.7* 12.1* 12.3* 12.4* 12.9* 12.9*  HCT 37.9* 35.5* 34.4* 36.3* 34.8* 38.0* 38.0*  MCV 92.9 92.9 93.2 95.3 92.3  --   --   PLT 201 200 203 213 221  --   --    Basic Metabolic Panel: Recent Labs  Lab 12/20/22 1954 12/21/22 1015 12/22/22 0900 12/23/22 0522 12/23/22 1225 12/23/22 1229 12/24/22 0511  NA 135 135 135 133* 135 135 136  K 4.1 4.2 4.6 4.4 3.6 3.7 4.2  CL 106 105 104 101  --   --  104  CO2 20* 22 24 24   --   --  23  GLUCOSE 90 161* 92 96  --   --  102*  BUN <5* 8 12 10   --   --  15  CREATININE 0.82 0.92 1.10 0.90  --   --  0.98  CALCIUM 8.4* 8.7* 9.0 8.9  --   --  8.8*  MG  --   --   --   --   --   --  2.1   GFR: Estimated Creatinine Clearance: 66 mL/min (by C-G formula based on SCr of 0.98 mg/dL). Liver Function Tests: No results for input(s): "AST", "ALT", "ALKPHOS", "BILITOT", "PROT", "ALBUMIN" in the last 168 hours. No results for input(s): "LIPASE", "AMYLASE" in the last 168 hours. No results for input(s): "AMMONIA" in the last 168 hours. Coagulation Profile: Recent Labs  Lab 12/20/22 2144  INR 1.1   Cardiac Enzymes: No results for input(s): "CKTOTAL", "CKMB", "CKMBINDEX", "TROPONINI" in the last 168 hours. BNP (last 3 results) No results for input(s): "PROBNP" in the last 8760 hours. HbA1C: No results for input(s): "HGBA1C" in the last 72 hours. CBG: No results for input(s): "GLUCAP" in the last 168 hours. Lipid Profile: Recent Labs    12/22/22 1136  CHOL 193  HDL 64  LDLCALC 114*  TRIG 76  CHOLHDL 3.0   Thyroid Function Tests: No results for input(s): "TSH", "T4TOTAL", "FREET4", "T3FREE", "THYROIDAB" in the last 72 hours. Anemia Panel: No results for input(s): "VITAMINB12", "FOLATE", "FERRITIN", "TIBC", "IRON", "RETICCTPCT" in the last 72 hours. Most Recent Urinalysis On File:     Component Value Date/Time   COLORURINE STRAW (A) 09/20/2022 0534   APPEARANCEUR CLEAR (A) 09/20/2022  0534   APPEARANCEUR Clear 07/30/2016 1600   LABSPEC 1.002 (L) 09/20/2022 0534   LABSPEC 1.019 03/28/2014 1430   PHURINE 6.0 09/20/2022 0534   GLUCOSEU NEGATIVE 09/20/2022 0534   GLUCOSEU Negative 03/28/2014 1430   HGBUR NEGATIVE 09/20/2022 0534   BILIRUBINUR NEGATIVE 09/20/2022 0534   BILIRUBINUR Negative 07/30/2016 1600   BILIRUBINUR Negative 03/28/2014 1430   KETONESUR NEGATIVE 09/20/2022 0534   PROTEINUR NEGATIVE 09/20/2022 0534   NITRITE NEGATIVE 09/20/2022 0534   LEUKOCYTESUR NEGATIVE 09/20/2022 0534   LEUKOCYTESUR Negative 03/28/2014 1430   Sepsis Labs: @LABRCNTIP (procalcitonin:4,lacticidven:4) Microbiology: No results found for this or any previous visit (from the past 240 hour(s)).    Radiology Studies last 3 days: CARDIAC CATHETERIZATION  Result Date: 12/23/2022 Conclusions: Severe single-vessel coronary artery disease with chronic total occlusion of distal LCx/LPLA.  LPDA is supplied by left-to-left collaterals.  There is mild-moderate, non-obstructive disease involving the LAD. Upper normal to mildly elevated left and right heart filling pressures (LVEDP 17 mmHg, PCWP 18 mmHg, RA 7 mmHg). Moderate pulmonary hypertension (mean PA 37 mmHg, PVR 5 WU) Mildly reduced Fick cardiac output/index (CO 3.8 L/min, CI 2.1 L/min/m^2). Recommendations: Continue gentle diuresis. Increase metoprolol tartrate to 50 mg every 6 hours for improved rate control; transition to evidence-based beta blocker prior to discharge. Continue digoxin. Increase losartan to 25 mg daily. Secondary prevention of coronary artery disease; there are no targets for revascularization. Yvonne Kendall, MD [facility]  ECHOCARDIOGRAM COMPLETE  Result Date: 12/21/2022    ECHOCARDIOGRAM REPORT   Patient Name:   KIMO BANCROFT Date of Exam: 12/21/2022 Medical Rec #:  [id number]    Height:       66.0 in Accession #:    [id number]   Weight:       155.0 lb Date of Birth:  02-13-1956   BSA:          1.794 m Patient Age:    66  years     BP:           115/80 mmHg  Patient Gender: M            HR:           109 bpm. Exam Location:  [facility] Procedure: 2D Echo, Cardiac Doppler and Color Doppler Indications:     NSTEMI  History:         Patient has prior history of Echocardiogram examinations, most                  recent 03/11/2021. Acute MI and CAD, PAD, Arrythmias:Atrial                  Fibrillation, Signs/Symptoms:Chest Pain; Risk                  Factors:Hypertension, Dyslipidemia and Current Smoker.                  Polysubstance abuse.  Sonographer:     Mikki Harbor Referring Phys:  [id number] Andris Baumann Diagnosing Phys: Lorine Bears MD IMPRESSIONS  1. Left ventricular ejection fraction, by estimation, is 35 to 40%. The left ventricle has moderately decreased function. The left ventricle demonstrates global hypokinesis. There is mild left ventricular hypertrophy. Left ventricular diastolic parameters are indeterminate.  2. Right ventricular systolic function is normal. The right ventricular size is normal. There is moderately elevated pulmonary artery systolic pressure. The estimated right ventricular systolic pressure is 53.4 mmHg.  3. Left atrial size was mildly dilated.  4. Right atrial size was mildly dilated.  5. The mitral valve is normal in structure. Mild to moderate mitral valve regurgitation. No evidence of mitral stenosis.  6. Tricuspid valve regurgitation is moderate.  7. The aortic valve is normal in structure. Aortic valve regurgitation is mild. No aortic stenosis is present.  8. Pulmonic valve regurgitation is moderate.  9. Aortic dilatation noted. There is mild dilatation of the aortic root, measuring 42 mm. 10. Mildly dilated pulmonary artery. 11. The inferior vena cava is normal in size with <50% respiratory variability, suggesting right atrial pressure of 8 mmHg. FINDINGS  Left Ventricle: Left ventricular ejection fraction, by estimation, is 35 to 40%. The left ventricle has moderately decreased function. The left  ventricle demonstrates global hypokinesis. The left ventricular internal cavity size was normal in size. There is mild left ventricular hypertrophy. Left ventricular diastolic parameters are indeterminate. Right Ventricle: The right ventricular size is normal. No increase in right ventricular wall thickness. Right ventricular systolic function is normal. There is moderately elevated pulmonary artery systolic pressure. The tricuspid regurgitant velocity is 3.37 m/s, and with an assumed right atrial pressure of 8 mmHg, the estimated right ventricular systolic pressure is 53.4 mmHg. Left Atrium: Left atrial size was mildly dilated. Right Atrium: Right atrial size was mildly dilated. Pericardium: There is no evidence of pericardial effusion. Mitral Valve: The mitral valve is normal in structure. Mild to moderate mitral valve regurgitation. No evidence of mitral valve stenosis. MV peak gradient, 4.2 mmHg. The mean mitral valve gradient is 2.0 mmHg. Tricuspid Valve: The tricuspid valve is normal in structure. Tricuspid valve regurgitation is moderate . No evidence of tricuspid stenosis. Aortic Valve: The aortic valve is normal in structure. Aortic valve regurgitation is mild. No aortic stenosis is present. Aortic valve mean gradient measures 2.3 mmHg. Aortic valve peak gradient measures 5.2 mmHg. Aortic valve area, by VTI measures 2.40 cm. Pulmonic Valve: The pulmonic valve was normal in structure. Pulmonic valve regurgitation is moderate. No evidence of pulmonic stenosis. Aorta: Aortic dilatation noted. There is mild dilatation  of the aortic root, measuring 42 mm. Pulmonary Artery: The pulmonary artery is mildly dilated. Venous: The inferior vena cava is normal in size with less than 50% respiratory variability, suggesting right atrial pressure of 8 mmHg. IAS/Shunts: No atrial level shunt detected by color flow Doppler.  LEFT VENTRICLE PLAX 2D LVIDd:         4.90 cm LVIDs:         3.90 cm LV PW:         1.20 cm LV IVS:         1.00 cm LVOT diam:     2.00 cm LV SV:         44 LV SV Index:   25 LVOT Area:     3.14 cm  RIGHT VENTRICLE RV Basal diam:  3.70 cm RV Mid diam:    3.20 cm RV S prime:     10.00 cm/s LEFT ATRIUM             Index        RIGHT ATRIUM           Index LA diam:        3.50 cm 1.95 cm/m   RA Area:     20.00 cm LA Vol (A2C):   56.7 ml 31.60 ml/m  RA Volume:   56.80 ml  31.65 ml/m LA Vol (A4C):   64.2 ml 35.78 ml/m LA Biplane Vol: 63.9 ml 35.61 ml/m  AORTIC VALVE                    PULMONIC VALVE AV Area (Vmax):    2.51 cm     PV Vmax:       0.75 m/s AV Area (Vmean):   2.57 cm     PV Peak grad:  2.2 mmHg AV Area (VTI):     2.40 cm AV Vmax:           114.43 cm/s AV Vmean:          68.900 cm/s AV VTI:            0.185 m AV Peak Grad:      5.2 mmHg AV Mean Grad:      2.3 mmHg LVOT Vmax:         91.35 cm/s LVOT Vmean:        56.350 cm/s LVOT VTI:          0.142 m LVOT/AV VTI ratio: 0.76  AORTA Ao Root diam: 4.30 cm Ao Asc diam:  3.70 cm MITRAL VALVE                TRICUSPID VALVE MV Area (PHT): 6.04 cm     TR Peak grad:   45.4 mmHg MV Area VTI:   2.75 cm     TR Vmax:        337.00 cm/s MV Peak grad:  4.2 mmHg MV Mean grad:  2.0 mmHg     SHUNTS MV Vmax:       1.02 m/s     Systemic VTI:  0.14 m MV Vmean:      58.4 cm/s    Systemic Diam: 2.00 cm MV Decel Time: 126 msec MV E velocity: 109.75 cm/s Lorine Bears MD Electronically signed by Lorine Bears MD Signature Date/Time: 12/21/2022/2:10:48 PM    Final    DG Chest Portable 1 View  Result Date: 12/20/2022 CLINICAL DATA:  Shortness of breath EXAM: PORTABLE CHEST 1 VIEW COMPARISON:  09/20/2022 FINDINGS:  Heart is borderline in size. Lungs clear. No effusions or edema. No acute bony abnormality. IMPRESSION: No active disease. Electronically Signed   By: Charlett Nose M.D.   On: 12/20/2022 19:01             LOS: 4 days    Time spent: 50 min    Sunnie Nielsen, DO [facility] 12/24/2022, 5:01 PM    Dictation software may have been  used to generate the above note. Typos may occur and escape review in typed/dictated notes. Please contact Dr Lyn Hollingshead directly for clarity if needed.  Staff may message me via secure chat in Epic  but this may not receive an immediate response,  please page me for urgent matters!  If 7PM-7AM, please contact night coverage [email or web address]

## 2022-12-24 NOTE — Evaluation (Signed)
Occupational Therapy Evaluation Patient Details Name: Darren Rogers MRN: [id number] DOB: 12/25/1955 Today's Date: 12/24/2022   History of Present Illness 67 y.o. 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  He also had shortness of breath and palpitations and admits to not taking his diltiazem as he should as he ran out of refills.   Clinical Impression   Upon entering the room, pt supine in bed and agreeable to OT intervention. Pt reports living at home with sister Ind at baseline without use of AD. Pt reports being Ind with ADLs and shares IADL tasks with sister. She drives them to appointments. He ambulates short distances secondary to increased pain related to vascular issues at baseline. Pt ambulates in room and performs toileting needs without physical assistance or AD needs this session. Pt transitions to PT session without issue. Pt without skilled OT needs at this time. OT to complete orders.      Recommendations for follow up therapy are one component of a multi-disciplinary discharge planning process, led by the attending physician.  Recommendations may be updated based on patient status, additional functional criteria and insurance authorization.   Assistance Recommended at Discharge None     Functional Status Assessment  Patient has not had a recent decline in their functional status  Equipment Recommendations  None recommended by OT       Precautions / Restrictions Precautions Precautions: Fall      Mobility Bed Mobility Overal bed mobility: Modified Independent             General bed mobility comments: HOB elevated but no assistance needed    Transfers Overall transfer level:  Modified independent Equipment used: None                      Balance Overall balance assessment: Modified Independent                                         ADL either performed or assessed with clinical judgement   ADL Overall ADL's : Independent                                       General ADL Comments: No physical assistance needed for ambulation within room or toileting needs before pt transitions to PT evaluation.     Vision Patient Visual Report: No change from baseline              Pertinent Vitals/Pain Pain Assessment Pain Assessment: No/denies pain        Extremity/Trunk Assessment Upper Extremity Assessment Upper Extremity Assessment: Overall WFL for tasks assessed           Communication Communication Communication: No difficulties   Cognition Arousal/Alertness: Awake/alert Behavior During Therapy: WFL for tasks assessed/performed Overall Cognitive Status: Within Functional Limits for tasks assessed  Home Living Family/patient expects to be discharged to:: Private residence Living Arrangements: Other relatives (lives with disabled sister) Available Help at Discharge: Family;Available 24 hours/day Type of Home: House Home Access: Ramped entrance     Home Layout: One level     Bathroom Shower/Tub: Walk-in shower;Tub/shower unit;Sponge bathes at baseline   Bathroom Toilet: Standard     Home Equipment: Agricultural consultant (2 wheels);Wheelchair - manual   Additional Comments: Pt reports some equipment in outside building and he does not know the condition of it. Sister has LE amputation and is in wheelchair.      Prior Functioning/Environment Prior Level of Function : Independent/Modified Independent               ADLs Comments: Pt ambulates independently short distances without AD, performs ADLs Ind, and shares IADLs with  sister. Sister drives them.                 OT Goals(Current goals can be found in the care plan section) Acute Rehab OT Goals Patient Stated Goal: to return home OT Goal Formulation: With patient Time For Goal Achievement: 12/24/22 Potential to Achieve Goals: Good  OT Frequency:         AM-PAC OT "6 Clicks" Daily Activity     Outcome Measure Help from another person eating meals?: None Help from another person taking care of personal grooming?: None Help from another person toileting, which includes using toliet, bedpan, or urinal?: None Help from another person bathing (including washing, rinsing, drying)?: None Help from another person to put on and taking off regular upper body clothing?: None Help from another person to put on and taking off regular lower body clothing?: None 6 Click Score: 24   End of Session Nurse Communication: Mobility status  Activity Tolerance: Patient tolerated treatment well Patient left: Other (comment) (transitioned to PT session without issue)                   Time: 1040-1049 OT Time Calculation (min): 9 min Charges:  OT General Charges $OT Visit: 1 Visit OT Evaluation $OT Eval Low Complexity: 1 Low  Jackquline Denmark, MS, OTR/L , CBIS ascom [phone]  12/24/22, 12:25 PM

## 2022-12-24 NOTE — Progress Notes (Signed)
Rounding Note    Patient Name: Darren Rogers Date of Encounter: 12/24/2022  [facility] Cardiologist: Lorine Bears, MD   Subjective   Cardiac catheterization performed yesterday Noted to have occlusion of distal left circumflex/PL branch with left to left collaterals No intervention performed Transition from metoprolol to carvedilol 25 twice daily yesterday for rate control Telemetry reviewed, heart rate 100 up to 126  Inpatient Medications    Scheduled Meds:  apixaban  5 mg Oral BID   aspirin EC  81 mg Oral Daily   atorvastatin  40 mg Oral Daily   carvedilol  37.5 mg Oral BID WC   [START ON 12/25/2022] digoxin  0.25 mg Oral Daily   folic acid  1 mg Oral Daily   losartan  25 mg Oral Daily   metoprolol tartrate  5 mg Intravenous Once   multivitamin with minerals  1 tablet Oral Daily   pantoprazole  40 mg Oral Daily   sodium chloride flush  3 mL Intravenous Q12H   thiamine  100 mg Oral Daily   Or   thiamine  100 mg Intravenous Daily   Continuous Infusions:  sodium chloride     PRN Meds: sodium chloride, acetaminophen, nitroGLYCERIN, ondansetron (ZOFRAN) IV, polyethylene glycol, sodium chloride flush   Vital Signs    Vitals:   12/23/22 2353 12/24/22 0337 12/24/22 0826 12/24/22 1232  BP: (!) 129/106 (!) 157/105 127/65 105/70  Pulse:  89 99 67  Resp: 20 16 18 18   Temp: 98.1 F (36.7 C) 98 F (36.7 C) 98.1 F (36.7 C) 97.9 F (36.6 C)  TempSrc: Oral Oral Oral   SpO2: 100% 99% 100%   Weight:      Height:        Intake/Output Summary (Last 24 hours) at 12/24/2022 1420 Last data filed at 12/24/2022 1228 Gross per 24 hour  Intake 1540 ml  Output 1151 ml  Net 389 ml      12/23/2022    4:00 AM 12/22/2022    1:41 PM 12/20/2022    5:46 PM  Last 3 Weights  Weight (lbs) 152 lb 1.9 oz 121 lb 4.1 oz 154 lb 15.7 oz  Weight (kg) 69 kg 55 kg 70.3 kg      Telemetry    Atrial fibrillation rate 100 up to 120- Personally Reviewed  ECG     - Personally  Reviewed  Physical Exam   GEN: No acute distress.   Neck: No JVD Cardiac: Irregularly irregular, no murmurs, rubs, or gallops.  Respiratory: Clear to auscultation bilaterally. GI: Soft, nontender, non-distended  MS: No edema; No deformity. Neuro:  Nonfocal  Psych: Normal affect   Labs    High Sensitivity Troponin:   Recent Labs  Lab 12/20/22 1749 12/20/22 1953 12/20/22 2147  TROPONINIHS 12 101* 197*     Chemistry Recent Labs  Lab 12/22/22 0900 12/23/22 0522 12/23/22 1225 12/23/22 1229 12/24/22 0511  NA 135 133* 135 135 136  K 4.6 4.4 3.6 3.7 4.2  CL 104 101  --   --  104  CO2 24 24  --   --  23  GLUCOSE 92 96  --   --  102*  BUN 12 10  --   --  15  CREATININE 1.10 0.90  --   --  0.98  CALCIUM 9.0 8.9  --   --  8.8*  MG  --   --   --   --  2.1  GFRNONAA >60 >60  --   --  >  60  ANIONGAP 7 8  --   --  9    Lipids  Recent Labs  Lab 12/22/22 1136  CHOL 193  TRIG 76  HDL 64  LDLCALC 114*  CHOLHDL 3.0    Hematology Recent Labs  Lab 12/21/22 1015 12/22/22 0900 12/23/22 0522 12/23/22 1225 12/23/22 1229  WBC 4.9 5.4 4.5  --   --   RBC 3.69* 3.81* 3.77*  --   --   HGB 12.1* 12.3* 12.4* 12.9* 12.9*  HCT 34.4* 36.3* 34.8* 38.0* 38.0*  MCV 93.2 95.3 92.3  --   --   MCH 32.8 32.3 32.9  --   --   MCHC 35.2 33.9 35.6  --   --   RDW 16.7* 16.6* 15.9*  --   --   PLT 203 213 221  --   --    Thyroid No results for input(s): "TSH", "FREET4" in the last 168 hours.  BNP Recent Labs  Lab 12/20/22 1749  BNP 283.9*    DDimer No results for input(s): "DDIMER" in the last 168 hours.   Radiology    CARDIAC CATHETERIZATION  Result Date: 12/23/2022 Conclusions: Severe single-vessel coronary artery disease with chronic total occlusion of distal LCx/LPLA.  LPDA is supplied by left-to-left collaterals.  There is mild-moderate, non-obstructive disease involving the LAD. Upper normal to mildly elevated left and right heart filling pressures (LVEDP 17 mmHg, PCWP 18 mmHg,  RA 7 mmHg). Moderate pulmonary hypertension (mean PA 37 mmHg, PVR 5 WU) Mildly reduced Fick cardiac output/index (CO 3.8 L/min, CI 2.1 L/min/m^2). Recommendations: Continue gentle diuresis. Increase metoprolol tartrate to 50 mg every 6 hours for improved rate control; transition to evidence-based beta blocker prior to discharge. Continue digoxin. Increase losartan to 25 mg daily. Secondary prevention of coronary artery disease; there are no targets for revascularization. Yvonne Kendall, MD [facility]   Cardiac Studies     Patient Profile     Darren Rogers is a 67 y.o. male with a hx of persistent Afib, nonobstructive CAD, HTN, HLD, noncompliance with polysubstance abuse who is being seen 12/21/2022 for the evaluation of chest pain   Assessment & Plan   Coronary disease with stable angina  Presenting with severe chest pain, peak troponin 197 Ejection fraction moderately reduced 35 to 40% with moderately elevated right heart pressures Prior ischemic workup 2022 showing nonobstructive disease -Position yesterday with chronic occlusion of distal left circumflex vessel, no intervention performed -On beta-blocker, Eliquis and aspirin, statin  Permanent atrial fibrillation Started on metoprolol tartrate 37.5 mg every 6 hours, rate elevated -Transition to carvedilol 25 twice daily July 24 -On heparin infusion CHA2DS2-VASc at least 4 -Continues to have elevated rate on rounds -Digoxin up to 0.25, carvedilol 37.5 twice daily -Low blood pressure limiting further titration (may need to hold losartan if blood pressure remains low) -Given longtime persistent atrial fibrillation, lower likelihood of restoring normal sinus rhythm -If needed, though not ideal, amiodarone could be used given low probability of converting to sinus -Eliquis 5 twice daily   Essential hypertension Lasix 20 daily, losartan 25 daily, Coreg as above   Cardiomyopathy Transition to carvedilol, continue losartan, digoxin,  Lasix   Total encounter time more than 50 minutes  Greater than 50% was spent in counseling and coordination of care with the patient     For questions or updates, please contact [facility] Please consult [email or web address] for contact info under        Signed, Julien Nordmann, MD  12/24/2022, 2:20  PM

## 2022-12-24 NOTE — Plan of Care (Signed)
  Problem: Activity: Goal: Ability to tolerate increased activity will improve Outcome: Progressing   Problem: Cardiac: Goal: Ability to achieve and maintain adequate cardiovascular perfusion will improve Outcome: Progressing   Problem: Education: Goal: Understanding of CV disease, CV risk reduction, and recovery process will improve Outcome: Progressing   Problem: Activity: Goal: Ability to return to baseline activity level will improve Outcome: Progressing

## 2022-12-24 NOTE — Evaluation (Signed)
Physical Therapy Evaluation Patient Details Name: Darren Rogers MRN: [id number] DOB: 1955/07/18 Today's Date: 12/24/2022  History of Present Illness  Patient is a 67 year old male with chest pain s/p right/left cardiac catheterization, severe CAD. History of polysubstance use, alcohol use disorder with recurrent alcoholic pancreatitis, A fib non adherent with eliquis, HTN, HLD, depression, CAD, BPH.  Clinical Impression  Patient is agreeable to PT evaluation. He reports having limited distance ambulation at baseline secondary leg pain, but is independent with mobility otherwise. He and his sister live together with ramped entrance.  The patient is Mod I with bed mobility and transfers. He ambulated in room with no overt loss of balance, but occasionally reaching out for furniture for support. The patient self limits further ambulation distance secondary to chronic leg pain with prolonged standing activity. Consider rollator walker for home use given chronic leg pain with ambulation. PT will follow up while in the hospital to maximize independence.       Assistance Recommended at Discharge PRN  If plan is discharge home, recommend the following:  Can travel by private vehicle  Assist for transportation        Equipment Recommendations Rollator (4 wheels)  Recommendations for Other Services       Functional Status Assessment Patient has had a recent decline in their functional status and demonstrates the ability to make significant improvements in function in a reasonable and predictable amount of time.     Precautions / Restrictions Precautions Precautions: Fall      Mobility  Bed Mobility Overal bed mobility: Modified Independent                  Transfers Overall transfer level: Modified independent                 General transfer comment: encouraged patient to limit the use of R hand/wrist given recent catheterization    Ambulation/Gait Ambulation/Gait  assistance: Supervision Gait Distance (Feet): 20 Feet Assistive device: None   Gait velocity: decreased     General Gait Details: intermittently reaching out for furniture for support. no overt loss of balance. further ambulation distance self limited related to chronic leg pain with mobility  Stairs            Wheelchair Mobility     Tilt Bed    Modified Rankin (Stroke Patients Only)       Balance Overall balance assessment: Modified Independent                                           Pertinent Vitals/Pain Pain Assessment Pain Assessment: No/denies pain    Home Living Family/patient expects to be discharged to:: Private residence Living Arrangements: Other relatives Available Help at Discharge: Family;Available 24 hours/day Type of Home: House Home Access: Ramped entrance       Home Layout: One level Home Equipment: Agricultural consultant (2 wheels);Wheelchair - manual Additional Comments: sister drives patient    Prior Function Prior Level of Function : Independent/Modified Independent             Mobility Comments: very limited distance secondary to leg pain (reports needing to see vascular surgery) ADLs Comments: Pt ambulates independently short distances without AD, performs ADLs Ind, and shares IADLs with sister. Sister drives them.     Hand Dominance        Extremity/Trunk Assessment   Upper  Extremity Assessment Upper Extremity Assessment: Overall WFL for tasks assessed    Lower Extremity Assessment Lower Extremity Assessment: Generalized weakness (pain in legs with mobility)       Communication   Communication: No difficulties  Cognition Arousal/Alertness: Awake/alert Behavior During Therapy: WFL for tasks assessed/performed Overall Cognitive Status: Within Functional Limits for tasks assessed                                          General Comments      Exercises     Assessment/Plan    PT  Assessment Patient needs continued PT services  PT Problem List Decreased activity tolerance;Decreased mobility;Pain       PT Treatment Interventions Gait training;Stair training;Functional mobility training;Therapeutic activities;Therapeutic exercise;Balance training;Cognitive remediation;Patient/family education    PT Goals (Current goals can be found in the Care Plan section)  Acute Rehab PT Goals Patient Stated Goal: to go home PT Goal Formulation: With patient Time For Goal Achievement: 01/07/23 Potential to Achieve Goals: Good    Frequency Min 1X/week     Co-evaluation               AM-PAC PT "6 Clicks" Mobility  Outcome Measure Help needed turning from your back to your side while in a flat bed without using bedrails?: None Help needed moving from lying on your back to sitting on the side of a flat bed without using bedrails?: None Help needed moving to and from a bed to a chair (including a wheelchair)?: None Help needed standing up from a chair using your arms (e.g., wheelchair or bedside chair)?: None Help needed to walk in hospital room?: A Little Help needed climbing 3-5 steps with a railing? : A Little 6 Click Score: 22    End of Session   Activity Tolerance: Patient tolerated treatment well Patient left: in bed;with call bell/phone within reach;with bed alarm set   PT Visit Diagnosis: Muscle weakness (generalized) (M62.81);Difficulty in walking, not elsewhere classified (R26.2)    Time: 1027-2536 PT Time Calculation (min) (ACUTE ONLY): 18 min   Charges:   PT Evaluation $PT Eval Low Complexity: 1 Low PT Treatments $Therapeutic Activity: 8-22 mins PT General Charges $$ ACUTE PT VISIT: 1 Visit         Donna Bernard, PT, MPT   Ina Homes 12/24/2022, 1:38 PM

## 2022-12-25 DIAGNOSIS — I5021 Acute systolic (congestive) heart failure: Secondary | ICD-10-CM

## 2022-12-25 DIAGNOSIS — I482 Chronic atrial fibrillation, unspecified: Secondary | ICD-10-CM | POA: Diagnosis not present

## 2022-12-25 MED ORDER — SPIRONOLACTONE 12.5 MG HALF TABLET
12.5000 mg | ORAL_TABLET | Freq: Every day | ORAL | Status: DC
  Administered 2022-12-25 – 2022-12-28 (×4): 12.5 mg via ORAL
  Filled 2022-12-25 (×4): qty 1

## 2022-12-25 MED ORDER — POTASSIUM CHLORIDE CRYS ER 20 MEQ PO TBCR
20.0000 meq | EXTENDED_RELEASE_TABLET | Freq: Once | ORAL | Status: AC
  Administered 2022-12-25: 20 meq via ORAL
  Filled 2022-12-25: qty 1

## 2022-12-25 MED ORDER — METOPROLOL SUCCINATE ER 100 MG PO TB24
100.0000 mg | ORAL_TABLET | Freq: Two times a day (BID) | ORAL | Status: DC
  Administered 2022-12-25 – 2022-12-28 (×7): 100 mg via ORAL
  Filled 2022-12-25 (×7): qty 1

## 2022-12-25 NOTE — Progress Notes (Signed)
Rounding Note    Patient Name: Darren Rogers Date of Encounter: 12/25/2022  [facility] Cardiologist: Lorine Bears, MD   Subjective   No chest pain, dyspnea, or palpitations. Reports an episode of "blacking out" at the sink yesterday around 2-3 PM. Denies falling to the ground or trauma. Reported weakness getting back to the bed. Episode was not associated with angina or palpitations. Telemetry reviewed and shows known Afib with RVR and artifact.   Inpatient Medications    Scheduled Meds:  apixaban  5 mg Oral BID   aspirin EC  81 mg Oral Daily   atorvastatin  40 mg Oral Daily   digoxin  0.25 mg Oral Daily   folic acid  1 mg Oral Daily   furosemide  20 mg Oral Daily   losartan  25 mg Oral Daily   metoprolol succinate  100 mg Oral BID   multivitamin with minerals  1 tablet Oral Daily   pantoprazole  40 mg Oral Daily   sodium chloride flush  3 mL Intravenous Q12H   thiamine  100 mg Oral Daily   Or   thiamine  100 mg Intravenous Daily   Continuous Infusions:  sodium chloride     PRN Meds: sodium chloride, acetaminophen, nitroGLYCERIN, ondansetron (ZOFRAN) IV, polyethylene glycol, sodium chloride flush   Vital Signs    Vitals:   12/24/22 2029 12/25/22 0007 12/25/22 0457 12/25/22 0934  BP: 103/74 134/84 (!) 138/97 107/67  Pulse: 76 81 63 99  Resp: 16 18 18 20   Temp: 97.9 F (36.6 C) 97.9 F (36.6 C) 97.9 F (36.6 C) 98.4 F (36.9 C)  TempSrc:      SpO2: 98% 96% 100% 97%  Weight:      Height:        Intake/Output Summary (Last 24 hours) at 12/25/2022 1012 Last data filed at 12/25/2022 0455 Gross per 24 hour  Intake 720 ml  Output 1350 ml  Net -630 ml      12/23/2022    4:00 AM 12/22/2022    1:41 PM 12/20/2022    5:46 PM  Last 3 Weights  Weight (lbs) 152 lb 1.9 oz 121 lb 4.1 oz 154 lb 15.7 oz  Weight (kg) 69 kg 55 kg 70.3 kg      Telemetry    Atrial fibrillation rate 100s up to 120- Personally Reviewed  ECG    No new tracings - Personally  Reviewed  Physical Exam   GEN: No acute distress.   Neck: No JVD Cardiac: Tachycardic, irregularly irregular, no murmurs, rubs, or gallops.  Respiratory: Clear to auscultation bilaterally. GI: Soft, nontender, non-distended  MS: No edema; No deformity. Neuro:  Nonfocal  Psych: Normal affect   Labs    High Sensitivity Troponin:   Recent Labs  Lab 12/20/22 1749 12/20/22 1953 12/20/22 2147  TROPONINIHS 12 101* 197*     Chemistry Recent Labs  Lab 12/23/22 0522 12/23/22 1225 12/23/22 1229 12/24/22 0511 12/25/22 0540  NA 133*   < > 135 136 136  K 4.4   < > 3.7 4.2 3.8  CL 101  --   --  104 105  CO2 24  --   --  23 24  GLUCOSE 96  --   --  102* 126*  BUN 10  --   --  15 19  CREATININE 0.90  --   --  0.98 1.02  CALCIUM 8.9  --   --  8.8* 9.0  MG  --   --   --  2.1  --   GFRNONAA >60  --   --  >60 >60  ANIONGAP 8  --   --  9 7   < > = values in this interval not displayed.    Lipids  Recent Labs  Lab 12/22/22 1136  CHOL 193  TRIG 76  HDL 64  LDLCALC 114*  CHOLHDL 3.0    Hematology Recent Labs  Lab 12/21/22 1015 12/22/22 0900 12/23/22 0522 12/23/22 1225 12/23/22 1229  WBC 4.9 5.4 4.5  --   --   RBC 3.69* 3.81* 3.77*  --   --   HGB 12.1* 12.3* 12.4* 12.9* 12.9*  HCT 34.4* 36.3* 34.8* 38.0* 38.0*  MCV 93.2 95.3 92.3  --   --   MCH 32.8 32.3 32.9  --   --   MCHC 35.2 33.9 35.6  --   --   RDW 16.7* 16.6* 15.9*  --   --   PLT 203 213 221  --   --    Thyroid No results for input(s): "TSH", "FREET4" in the last 168 hours.  BNP Recent Labs  Lab 12/20/22 1749  BNP 283.9*    DDimer No results for input(s): "DDIMER" in the last 168 hours.   Radiology     Cardiac Studies   2D echo 12/21/2022: 1. Left ventricular ejection fraction, by estimation, is 35 to 40%. The  left ventricle has moderately decreased function. The left ventricle  demonstrates global hypokinesis. There is mild left ventricular  hypertrophy. Left ventricular diastolic  parameters  are indeterminate.   2. Right ventricular systolic function is normal. The right ventricular  size is normal. There is moderately elevated pulmonary artery systolic  pressure. The estimated right ventricular systolic pressure is 53.4 mmHg.   3. Left atrial size was mildly dilated.   4. Right atrial size was mildly dilated.   5. The mitral valve is normal in structure. Mild to moderate mitral valve  regurgitation. No evidence of mitral stenosis.   6. Tricuspid valve regurgitation is moderate.   7. The aortic valve is normal in structure. Aortic valve regurgitation is  mild. No aortic stenosis is present.   8. Pulmonic valve regurgitation is moderate.   9. Aortic dilatation noted. There is mild dilatation of the aortic root,  measuring 42 mm.  10. Mildly dilated pulmonary artery.  11. The inferior vena cava is normal in size with <50% respiratory  variability, suggesting right atrial pressure of 8 mmHg.  __________  [facility] 12/23/2022: Conclusions: Severe single-vessel coronary artery disease with chronic total occlusion of distal LCx/LPLA.  LPDA is supplied by left-to-left collaterals.  There is mild-moderate, non-obstructive disease involving the LAD. Upper normal to mildly elevated left and right heart filling pressures (LVEDP 17 mmHg, PCWP 18 mmHg, RA 7 mmHg). Moderate pulmonary hypertension (mean PA 37 mmHg, PVR 5 WU) Mildly reduced Fick cardiac output/index (CO 3.8 L/min, CI 2.1 L/min/m^2).   Recommendations: Continue gentle diuresis. Increase metoprolol tartrate to 50 mg every 6 hours for improved rate control; transition to evidence-based beta blocker prior to discharge. Continue digoxin. Increase losartan to 25 mg daily. Secondary prevention of coronary artery disease; there are no targets for revascularization.  Patient Profile     Darren Rogers is a 67 y.o. male with a hx of persistent Afib, nonobstructive CAD, HTN, HLD, noncompliance with polysubstance abuse who is being seen  12/21/2022 for the evaluation of chest pain   Assessment & Plan   CAD involving the native coronary arteries with stable  angina:  -Presenting with severe chest pain, peak troponin 197 -Ejection fraction moderately reduced 35 to 40% with moderately elevated right heart pressures -R/LHC this admission with severe single vessel CAD with CTO of the distal LCx/LPDA with LPDA supplied by left to left collaterals with medical management advised  -Aspirin, Lipitor -Post cath instructions   Permanent atrial fibrillation -Ventricular rates remain suboptimally and difficult to control -For added ventricular rate control, stop Coreg and start Toprol XL 100 mg bid -Avoid diltiazem use with cardiomyopathy  -On Eliquis 5 mg bid  -CHA2DS2-VASc at least 4 -Now on digoxin 0.5 mg daily (digoxin level 0.5 on 7/25) -Intermittent low blood pressure limiting further titration (may need to hold losartan if blood pressure remains low) -Given longtime persistent atrial fibrillation, lower likelihood of restoring normal sinus rhythm -If ventricular rates remain difficult to control, may need EP input for further management, query if AV nodal ablation is needed with PPM implantation   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: -Appears euvolemic -Transition from Coreg to Toprol XL for added ventricular rate control as above -Oral Lasix 20 mg daily -Continue losartan, if BP allows consider transitioning to Entresto -RHC this admission with mildly elevated left and right heart filling pressures, moderate pulmonary hypertension and mildly reduced cardiac output/index -Escalate GDMT with addition of MRA and SGLT2i as able -Daily weights -Strict I/O -Maintain net equal fluid balance    HTN: -BP stable -Metoprolol in place of Coreg as above with continuation of losartan      For questions or updates, please contact [facility] Please consult [email or web address] for contact info under         Signed, Eula Listen, PA-C  12/25/2022, 10:12 AM

## 2022-12-25 NOTE — Progress Notes (Signed)
Mobility Specialist - Progress Note    12/25/22 1554  Mobility  Activity Ambulated independently in room  Level of Assistance Independent  Assistive Device None  Distance Ambulated (ft) 10 ft  Range of Motion/Exercises Active  Activity Response Tolerated well  Mobility Referral Yes  $Mobility charge 1 Mobility  Mobility Specialist Start Time (ACUTE ONLY) 1545  Mobility Specialist Stop Time (ACUTE ONLY) 1555  Mobility Specialist Time Calculation (min) (ACUTE ONLY) 10 min   Pt resting in bed on RA upon entry. Pt performed bed mobility indep and STS and ambulates to sink indep with no AD with encouragement. Pt returned to bed and left with needs in reach. Pt endorses no pain.   Johnathan Hausen Mobility Specialist 12/25/22, 3:57 PM

## 2022-12-25 NOTE — Progress Notes (Addendum)
Physical Therapy Treatment Patient Details Name: Darren Rogers MRN: [id number] DOB: 1956-01-20 Today's Date: 12/25/2022   History of Present Illness Patient is a 67 year old male with chest pain s/p right/left cardiac catheterization, severe CAD. History of polysubstance use, alcohol use disorder with recurrent alcoholic pancreatitis, A fib non adherent with eliquis, HTN, HLD, depression, CAD, BPH.    PT Comments  Patient is agreeable to PT. He reports a near syncopal episode yesterday while standing at the sink. Orthostatic vitals taken with no dizziness today with mobility. Educated patient to monitor for signs of dizziness with mobility for fall prevention. Patient ambulated with supervision without assistive device. Activity tolerance is limited by chronic leg pain and fatigue with standing, and patient may benefit from a rollator at discharge for seated rest breaks as needed. PT will continue to follow to maximize independence.   Orthostatic VS for the past 24 hrs:  BP- Lying Pulse- Lying BP- Sitting Pulse- Sitting BP- Standing at 0 minutes Pulse- Standing at 0 minutes  12/25/22 1155 117/84 62 116/82 84 (!) 133/92 94        Assistance Recommended at Discharge PRN  If plan is discharge home, recommend the following:  Can travel by private vehicle    Assist for transportation      Equipment Recommendations  Rollator (4 wheels)    Recommendations for Other Services       Precautions / Restrictions Precautions Precautions: Fall Restrictions Weight Bearing Restrictions: No     Mobility  Bed Mobility Overal bed mobility: Modified Independent                  Transfers Overall transfer level: Modified independent                      Ambulation/Gait Ambulation/Gait assistance: Supervision Gait Distance (Feet): 20 Feet Assistive device: None   Gait velocity: decreased     General Gait Details: intermittently reaching out for furniture for support.  no overt loss of balance. further ambulation distance self limited related to chronic leg pain with mobility and fatigue with activity. no dizziness is reported with mobility   Stairs             Wheelchair Mobility     Tilt Bed    Modified Rankin (Stroke Patients Only)       Balance Overall balance assessment: Modified Independent                                          Cognition Arousal/Alertness: Awake/alert Behavior During Therapy: WFL for tasks assessed/performed Overall Cognitive Status: Within Functional Limits for tasks assessed                                          Exercises      General Comments General comments (skin integrity, edema, etc.): patient reports he had a pre-syncopal event that happend yesterday afternoon while standing at the sink. encouraged patient to take time beteween mobility transitions and monitor for signs of dizziness with none reported this session.      Pertinent Vitals/Pain Pain Assessment Pain Assessment: No/denies pain    Home Living  Prior Function            PT Goals (current goals can now be found in the care plan section) Acute Rehab PT Goals Patient Stated Goal: to go home PT Goal Formulation: With patient Time For Goal Achievement: 01/07/23 Potential to Achieve Goals: Good Progress towards PT goals: Progressing toward goals    Frequency    Min 1X/week      PT Plan Current plan remains appropriate    Co-evaluation              AM-PAC PT "6 Clicks" Mobility   Outcome Measure  Help needed turning from your back to your side while in a flat bed without using bedrails?: None Help needed moving from lying on your back to sitting on the side of a flat bed without using bedrails?: None Help needed moving to and from a bed to a chair (including a wheelchair)?: None Help needed standing up from a chair using your arms (e.g.,  wheelchair or bedside chair)?: None Help needed to walk in hospital room?: A Little Help needed climbing 3-5 steps with a railing? : A Little 6 Click Score: 22    End of Session   Activity Tolerance: Patient tolerated treatment well Patient left: in bed;with call bell/phone within reach (patient refusing bed alarm and reports he will call for assist before getting up) Nurse Communication: Mobility status PT Visit Diagnosis: Muscle weakness (generalized) (M62.81);Difficulty in walking, not elsewhere classified (R26.2)     Time: 0272-5366 PT Time Calculation (min) (ACUTE ONLY): 20 min  Charges:    $Therapeutic Activity: 8-22 mins PT General Charges $$ ACUTE PT VISIT: 1 Visit                     Donna Bernard, PT, MPT    Ina Homes 12/25/2022, 1:09 PM

## 2022-12-25 NOTE — Plan of Care (Signed)
  Problem: Education: Goal: Understanding of cardiac disease, CV risk reduction, and recovery process will improve Outcome: Progressing Goal: Individualized Educational Video(s) Outcome: Progressing   Problem: Activity: Goal: Ability to tolerate increased activity will improve Outcome: Progressing   Problem: Cardiac: Goal: Ability to achieve and maintain adequate cardiovascular perfusion will improve Outcome: Progressing   Problem: Health Behavior/Discharge Planning: Goal: Ability to safely manage health-related needs after discharge will improve Outcome: Progressing   Problem: Education: Goal: Understanding of CV disease, CV risk reduction, and recovery process will improve Outcome: Progressing

## 2022-12-25 NOTE — Care Management Important Message (Addendum)
Important Message  Patient Details  Name: Sena Kuehler MRN: [id number] Date of Birth: [birth year]   Medicare Important Message Given:  Other (see comment)  Attempted to reach patient via room phone [phone]) to review Medicare IM. No answer at this time, will attempt at later time.  Update 1:11pm: Reviewed Medicare IM with patient via room phone [phone]).   [name] 12/25/2022, 11:30 AM

## 2022-12-25 NOTE — Progress Notes (Signed)
PROGRESS NOTE    Darren Rogers   NWG:[id number] DOB: [birth year]  DOA: 12/20/2022 Date of Service: 12/25/22 PCP: Lyndon Code, MD     Brief Narrative / Hospital Course:  Darren Rogers is a 67 y.o. 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  He also had shortness of breath and palpitations and admits to not taking his diltiazem as he should as he ran out of refills.   07/21: To ED, admitted to hospitalist service for A-fib/RVR, NSTEMI 07/22: Cardiology saw patient, acute CHF likely due to tachycardia versus alcohol.  Plan for right/left cardiac cath 07/23: Remains tachycardic, deferring cath until tomorrow 07/24: Right/left cardiac catheterization with Dr. Okey Dupre, severe CAD. Advised continue diuresis  07/25: HR still up, carvedilol increased, resume Eliquis and d/c heparin gtt 07/26: rate still high, adjusting to Toprol XL 100 mg bid and d/c Coreg    Consultants:  Cardiology   Procedures: [facility] 12/23/22       ASSESSMENT & PLAN:   Principal Problem:   Chronic atrial fibrillation with RVR (HCC) Active Problems:   NSTEMI (non-ST elevated myocardial infarction) (HCC)   CAD (coronary artery disease)   Essential hypertension   PAD (peripheral artery disease) (HCC)   Rapid atrial fibrillation (HCC)   Alcohol use disorder, moderate, dependence (HCC)   Non-adherence to medical treatment   Acute HFrEF (heart failure with reduced ejection fraction) (HCC)   Elevated troponin   Atrial fibrillation with rapid ventricular response (HCC) CHA2DS2-VASc at least 4  Adjusting rate control over past few days, per cardiology will start Toprol XL 100 mg bid today and d/c Coreg Eliquis 5 mg twice  daily Digoxin, increased to 0.5 mg today  Cardiology following Avoid diltiazem w/ cardiomyopathy  If ventricular rates remain difficult to control, may need EP input for further management, query if AV nodal ablation is needed with PPM implantation   Cardiomyopathy with HFrEF and moderately elevated right heart pressures metoprolol, digoxin as above Lasix 20 mg po daily  [place]  May need to hold losartan if low BP Weights, fluid restriction Cardiology following GDMT - add MRA and SGLT2i as able per cardiology  Daily weight, strict I&O   NSTEMI  CAD Status post LHC/RHC 12/23/2022 - Noted to have occlusion of distal left circumflex/PL branch with left to left collaterals. No intervention performed Cardiology following S/p heparin infusion Continue ASA, Rosuvastatin, other cardiac medications as able    Essential hypertension BP has been on the lower side her  Meds as above   History of cocaine use Urine drug screen negative Abstinence encouraged   Alcohol use disorder, moderate, dependence (HCC) Continue CIWA protocol Counseled on cutting back   DVT prophylaxis: Eliquis Pertinent IV fluids/nutrition: No continuous IV fluids Central lines / invasive devices: None  Code Status: Full code ACP documentation reviewed: 12/23/2022, none on file  Current Admission Status: Inpatient TOC needs / Dispo plan: Anticipate may need home health Barriers to discharge / significant pending items: Continuing gentle diuresis, cardiology following and will need clearance from them prior to discharge, hopefully can send home in the next couple of days             Subjective / Brief ROS:  Patient reports no concerns SOB is improved  Denies CP.  Pain controlled.  Denies new weakness.  Tolerating diet.    Family Communication: None at this time    Objective Findings:  Vitals:   12/24/22 2029 12/25/22 0007 12/25/22 0457 12/25/22 0934  BP: 103/74 134/84 (!) 138/97 107/67   Pulse: 76 81 63 99  Resp: 16 18 18 20   Temp: 97.9 F (36.6 C) 97.9 F (36.6 C) 97.9 F (36.6 C) 98.4 F (36.9 C)  TempSrc:      SpO2: 98% 96% 100% 97%  Weight:      Height:        Intake/Output Summary (Last 24 hours) at 12/25/2022 1547 Last data filed at 12/25/2022 1505 Gross per 24 hour  Intake 906.76 ml  Output 600 ml  Net 306.76 ml   Filed Weights   12/20/22 1746 12/22/22 1341 12/23/22 0400  Weight: 70.3 kg 55 kg 69 kg    Examination:  Physical Exam Constitutional:      General: He is not in acute distress. Cardiovascular:     Rate and Rhythm: Normal rate and regular rhythm.     Heart sounds: Heart sounds are distant.  Pulmonary:     Effort: Pulmonary effort is normal. No respiratory distress.     Breath sounds: Examination of the right-lower field reveals rales. Examination of the left-lower field reveals rales. Rales (improved some from yesterday) present.  Skin:    General: Skin is warm and dry.  Neurological:     General: No focal deficit present.     Mental Status: He is alert and oriented to person, place, and time.  Psychiatric:        Mood and Affect: Mood normal.        Behavior: Behavior normal.          Scheduled Medications:   apixaban  5 mg Oral BID   aspirin EC  81 mg Oral Daily   atorvastatin  40 mg Oral Daily   digoxin  0.25 mg Oral Daily   folic acid  1 mg Oral Daily   furosemide  20 mg Oral Daily   losartan  25 mg Oral Daily   metoprolol succinate  100 mg Oral BID   multivitamin with minerals  1 tablet Oral Daily   pantoprazole  40 mg Oral Daily   sodium chloride flush  3 mL Intravenous Q12H   spironolactone  12.5 mg Oral Daily   thiamine  100 mg Oral Daily   Or   thiamine  100 mg Intravenous Daily    Continuous Infusions:  sodium chloride      PRN Medications:  sodium chloride, acetaminophen, nitroGLYCERIN, ondansetron (ZOFRAN) IV, polyethylene glycol, sodium chloride flush  Antimicrobials from admission:   Anti-infectives (From admission, onward)    None           Data Reviewed:  I have personally reviewed the following...  CBC: Recent Labs  Lab 12/20/22 1749 12/21/22 0625 12/21/22 1015 12/22/22 0900 12/23/22 0522 12/23/22 1225 12/23/22 1229  WBC 3.8* 5.2 4.9 5.4 4.5  --   --   NEUTROABS 1.9  --  2.8 2.6 2.4  --   --   HGB 13.4 12.7* 12.1* 12.3* 12.4* 12.9* 12.9*  HCT 37.9* 35.5* 34.4* 36.3* 34.8* 38.0* 38.0*  MCV 92.9 92.9 93.2 95.3 92.3  --   --   PLT 201 200 203 213 221  --   --    Basic Metabolic Panel: Recent Labs  Lab 12/21/22 1015 12/22/22 0900 12/23/22 0522 12/23/22  1225 12/23/22 1229 12/24/22 0511 12/25/22 0540  NA 135 135 133* 135 135 136 136  K 4.2 4.6 4.4 3.6 3.7 4.2 3.8  CL 105 104 101  --   --  104 105  CO2 22 24 24   --   --  23 24  GLUCOSE 161* 92 96  --   --  102* 126*  BUN 8 12 10   --   --  15 19  CREATININE 0.92 1.10 0.90  --   --  0.98 1.02  CALCIUM 8.7* 9.0 8.9  --   --  8.8* 9.0  MG  --   --   --   --   --  2.1  --    GFR: Estimated Creatinine Clearance: 63.4 mL/min (by C-G formula based on SCr of 1.02 mg/dL). Liver Function Tests: No results for input(s): "AST", "ALT", "ALKPHOS", "BILITOT", "PROT", "ALBUMIN" in the last 168 hours. No results for input(s): "LIPASE", "AMYLASE" in the last 168 hours. No results for input(s): "AMMONIA" in the last 168 hours. Coagulation Profile: Recent Labs  Lab 12/20/22 2144  INR 1.1   Cardiac Enzymes: No results for input(s): "CKTOTAL", "CKMB", "CKMBINDEX", "TROPONINI" in the last 168 hours. BNP (last 3 results) No results for input(s): "PROBNP" in the last 8760 hours. HbA1C: No results for input(s): "HGBA1C" in the last 72 hours. CBG: No results for input(s): "GLUCAP" in the last 168 hours. Lipid Profile: No results for input(s): "CHOL", "HDL", "LDLCALC", "TRIG", "CHOLHDL", "LDLDIRECT" in the last 72 hours.  Thyroid Function Tests: No results for input(s): "TSH", "T4TOTAL", "FREET4",  "T3FREE", "THYROIDAB" in the last 72 hours. Anemia Panel: No results for input(s): "VITAMINB12", "FOLATE", "FERRITIN", "TIBC", "IRON", "RETICCTPCT" in the last 72 hours. Most Recent Urinalysis On File:     Component Value Date/Time   COLORURINE STRAW (A) 09/20/2022 0534   APPEARANCEUR CLEAR (A) 09/20/2022 0534   APPEARANCEUR Clear 07/30/2016 1600   LABSPEC 1.002 (L) 09/20/2022 0534   LABSPEC 1.019 03/28/2014 1430   PHURINE 6.0 09/20/2022 0534   GLUCOSEU NEGATIVE 09/20/2022 0534   GLUCOSEU Negative 03/28/2014 1430   HGBUR NEGATIVE 09/20/2022 0534   BILIRUBINUR NEGATIVE 09/20/2022 0534   BILIRUBINUR Negative 07/30/2016 1600   BILIRUBINUR Negative 03/28/2014 1430   KETONESUR NEGATIVE 09/20/2022 0534   PROTEINUR NEGATIVE 09/20/2022 0534   NITRITE NEGATIVE 09/20/2022 0534   LEUKOCYTESUR NEGATIVE 09/20/2022 0534   LEUKOCYTESUR Negative 03/28/2014 1430   Sepsis Labs: @LABRCNTIP (procalcitonin:4,lacticidven:4) Microbiology: No results found for this or any previous visit (from the past 240 hour(s)).    Radiology Studies last 3 days: CARDIAC CATHETERIZATION  Result Date: 12/23/2022 Conclusions: Severe single-vessel coronary artery disease with chronic total occlusion of distal LCx/LPLA.  LPDA is supplied by left-to-left collaterals.  There is mild-moderate, non-obstructive disease involving the LAD. Upper normal to mildly elevated left and right heart filling pressures (LVEDP 17 mmHg, PCWP 18 mmHg, RA 7 mmHg). Moderate pulmonary hypertension (mean PA 37 mmHg, PVR 5 WU) Mildly reduced Fick cardiac output/index (CO 3.8 L/min, CI 2.1 L/min/m^2). Recommendations: Continue gentle diuresis. Increase metoprolol tartrate to 50 mg every 6 hours for improved rate control; transition to evidence-based beta blocker prior to discharge. Continue digoxin. Increase losartan to 25 mg daily. Secondary prevention of coronary artery disease; there are no targets for revascularization. Yvonne Kendall, MD [facility]            LOS: 5 days    Time spent: 50 min    Sunnie Nielsen, DO [facility] 12/25/2022, 3:47  PM    Dictation software may have been used to generate the above note. Typos may occur and escape review in typed/dictated notes. Please contact Dr Lyn Hollingshead directly for clarity if needed.  Staff may message me via secure chat in Epic  but this may not receive an immediate response,  please page me for urgent matters!  If 7PM-7AM, please contact night coverage [email or web address]

## 2022-12-26 DIAGNOSIS — I5021 Acute systolic (congestive) heart failure: Secondary | ICD-10-CM | POA: Diagnosis not present

## 2022-12-26 DIAGNOSIS — I482 Chronic atrial fibrillation, unspecified: Secondary | ICD-10-CM | POA: Diagnosis not present

## 2022-12-26 MED ORDER — HYDRALAZINE HCL 20 MG/ML IJ SOLN: 5.0000 mg | Freq: Four times a day (QID) | INTRAMUSCULAR | Status: DC | PRN

## 2022-12-26 NOTE — Progress Notes (Signed)
Rounding Note    Patient Name: Darren Rogers Date of Encounter: 12/26/2022  [facility] Cardiologist: Lorine Bears, MD   Subjective   No chest pain, dyspnea, or palpitations. Feels much better today.   Inpatient Medications    Scheduled Meds:  apixaban  5 mg Oral BID   aspirin EC  81 mg Oral Daily   atorvastatin  40 mg Oral Daily   digoxin  0.25 mg Oral Daily   folic acid  1 mg Oral Daily   furosemide  20 mg Oral Daily   losartan  25 mg Oral Daily   metoprolol succinate  100 mg Oral BID   multivitamin with minerals  1 tablet Oral Daily   pantoprazole  40 mg Oral Daily   sodium chloride flush  3 mL Intravenous Q12H   spironolactone  12.5 mg Oral Daily   thiamine  100 mg Oral Daily   Or   thiamine  100 mg Intravenous Daily   Continuous Infusions:  sodium chloride     PRN Meds: sodium chloride, acetaminophen, nitroGLYCERIN, ondansetron (ZOFRAN) IV, polyethylene glycol, sodium chloride flush   Vital Signs    Vitals:   12/25/22 2128 12/26/22 0005 12/26/22 0357 12/26/22 0730  BP: (!) 138/91 (!) 154/108 (!) 151/87 (!) 167/96  Pulse: 94 99 98 100  Resp: 20 20 16 16   Temp:  98.1 F (36.7 C) 98.4 F (36.9 C) 97.7 F (36.5 C)  TempSrc:  Oral Oral   SpO2: 100% 99% 100% 100%  Weight:      Height:        Intake/Output Summary (Last 24 hours) at 12/26/2022 0814 Last data filed at 12/25/2022 1927 Gross per 24 hour  Intake 1386.76 ml  Output --  Net 1386.76 ml      12/23/2022    4:00 AM 12/22/2022    1:41 PM 12/20/2022    5:46 PM  Last 3 Weights  Weight (lbs) 152 lb 1.9 oz 121 lb 4.1 oz 154 lb 15.7 oz  Weight (kg) 69 kg 55 kg 70.3 kg      Telemetry    Atrial fibrillation rate 100s up to 120s bpm - Personally Reviewed  ECG    No new tracings - Personally Reviewed  Physical Exam   GEN: No acute distress.   Neck: No JVD Cardiac: Tachycardic, irregularly irregular, no murmurs, rubs, or gallops.  Respiratory: Clear to auscultation  bilaterally. GI: Soft, nontender, non-distended  MS: No edema; No deformity. Neuro:  Nonfocal  Psych: Normal affect   Labs    High Sensitivity Troponin:   Recent Labs  Lab 12/20/22 1749 12/20/22 1953 12/20/22 2147  TROPONINIHS 12 101* 197*     Chemistry Recent Labs  Lab 12/23/22 0522 12/23/22 1225 12/23/22 1229 12/24/22 0511 12/25/22 0540  NA 133*   < > 135 136 136  K 4.4   < > 3.7 4.2 3.8  CL 101  --   --  104 105  CO2 24  --   --  23 24  GLUCOSE 96  --   --  102* 126*  BUN 10  --   --  15 19  CREATININE 0.90  --   --  0.98 1.02  CALCIUM 8.9  --   --  8.8* 9.0  MG  --   --   --  2.1  --   GFRNONAA >60  --   --  >60 >60  ANIONGAP 8  --   --  9 7   < > =  values in this interval not displayed.    Lipids  Recent Labs  Lab 12/22/22 1136  CHOL 193  TRIG 76  HDL 64  LDLCALC 114*  CHOLHDL 3.0    Hematology Recent Labs  Lab 12/21/22 1015 12/22/22 0900 12/23/22 0522 12/23/22 1225 12/23/22 1229  WBC 4.9 5.4 4.5  --   --   RBC 3.69* 3.81* 3.77*  --   --   HGB 12.1* 12.3* 12.4* 12.9* 12.9*  HCT 34.4* 36.3* 34.8* 38.0* 38.0*  MCV 93.2 95.3 92.3  --   --   MCH 32.8 32.3 32.9  --   --   MCHC 35.2 33.9 35.6  --   --   RDW 16.7* 16.6* 15.9*  --   --   PLT 203 213 221  --   --    Thyroid No results for input(s): "TSH", "FREET4" in the last 168 hours.  BNP Recent Labs  Lab 12/20/22 1749  BNP 283.9*    DDimer No results for input(s): "DDIMER" in the last 168 hours.   Radiology     Cardiac Studies   2D echo 12/21/2022: 1. Left ventricular ejection fraction, by estimation, is 35 to 40%. The  left ventricle has moderately decreased function. The left ventricle  demonstrates global hypokinesis. There is mild left ventricular  hypertrophy. Left ventricular diastolic  parameters are indeterminate.   2. Right ventricular systolic function is normal. The right ventricular  size is normal. There is moderately elevated pulmonary artery systolic  pressure. The  estimated right ventricular systolic pressure is 53.4 mmHg.   3. Left atrial size was mildly dilated.   4. Right atrial size was mildly dilated.   5. The mitral valve is normal in structure. Mild to moderate mitral valve  regurgitation. No evidence of mitral stenosis.   6. Tricuspid valve regurgitation is moderate.   7. The aortic valve is normal in structure. Aortic valve regurgitation is  mild. No aortic stenosis is present.   8. Pulmonic valve regurgitation is moderate.   9. Aortic dilatation noted. There is mild dilatation of the aortic root,  measuring 42 mm.  10. Mildly dilated pulmonary artery.  11. The inferior vena cava is normal in size with <50% respiratory  variability, suggesting right atrial pressure of 8 mmHg.  __________  [facility] 12/23/2022: Conclusions: Severe single-vessel coronary artery disease with chronic total occlusion of distal LCx/LPLA.  LPDA is supplied by left-to-left collaterals.  There is mild-moderate, non-obstructive disease involving the LAD. Upper normal to mildly elevated left and right heart filling pressures (LVEDP 17 mmHg, PCWP 18 mmHg, RA 7 mmHg). Moderate pulmonary hypertension (mean PA 37 mmHg, PVR 5 WU) Mildly reduced Fick cardiac output/index (CO 3.8 L/min, CI 2.1 L/min/m^2).   Recommendations: Continue gentle diuresis. Increase metoprolol tartrate to 50 mg every 6 hours for improved rate control; transition to evidence-based beta blocker prior to discharge. Continue digoxin. Increase losartan to 25 mg daily. Secondary prevention of coronary artery disease; there are no targets for revascularization.  Patient Profile     Darren Rogers is a 67 y.o. male with a hx of persistent Afib, nonobstructive CAD, HTN, HLD, noncompliance with polysubstance abuse who is being seen 12/21/2022 for the evaluation of chest pain   Assessment & Plan   CAD involving the native coronary arteries with stable angina:  -Presenting with severe chest pain, peak  troponin 197 -Ejection fraction moderately reduced 35 to 40% with moderately elevated right heart pressures -R/LHC this admission with severe single vessel CAD  with CTO of the distal LCx/LPDA with LPDA supplied by left to left collaterals with medical management advised  -Aspirin, Lipitor -Post cath instructions   Permanent atrial fibrillation -Ventricular rates remain suboptimally and difficult to control, though are improving  -He has been switched back and forth between Coreg and metoprolol this admission -Continue Toprol XL 100 mg bid, as this is likely to offer better ventricular rate control -Avoid diltiazem use with cardiomyopathy  -With noncompliance with medications, including anticoagulation AAD is not a good option  -On Eliquis 5 mg bid  -CHA2DS2-VASc at least 4 -Now on digoxin 0.5 mg daily (digoxin level 0.5 on 7/25), recheck on 7/29 -Intermittent low blood pressure limiting further titration (may need to hold losartan if blood pressure remains low) -Given longtime persistent atrial fibrillation, lower likelihood of restoring normal sinus rhythm -If ventricular rates remain difficult to control, may need EP input for further management, query if AV nodal ablation is needed with PPM implantation   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: -Appears euvolemic -Toprol XL as above -Oral Lasix 20 mg daily -Spironolactone  -Continue losartan, if BP allows consider transitioning to [facility] -RHC this admission with mildly elevated left and right heart filling pressures, moderate pulmonary hypertension and mildly reduced cardiac output/index -Escalate GDMT with addition of SGLT2i as able -Daily weights -Strict I/O -Maintain net equal fluid balance    HTN: -BP labile  -Medications as above      For questions or updates, please contact [facility] Please consult [email or web address] for contact info under        Signed, Eula Listen, PA-C  12/26/2022, 8:14 AM

## 2022-12-26 NOTE — Plan of Care (Signed)
  Problem: Education: Goal: Understanding of cardiac disease, CV risk reduction, and recovery process will improve Outcome: Progressing Goal: Individualized Educational Video(s) Outcome: Progressing   Problem: Activity: Goal: Ability to tolerate increased activity will improve Outcome: Progressing   Problem: Cardiac: Goal: Ability to achieve and maintain adequate cardiovascular perfusion will improve Outcome: Progressing   Problem: Health Behavior/Discharge Planning: Goal: Ability to safely manage health-related needs after discharge will improve Outcome: Progressing   Problem: Education: Goal: Understanding of CV disease, CV risk reduction, and recovery process will improve Outcome: Progressing Goal: Individualized Educational Video(s) Outcome: Progressing   Problem: Activity: Goal: Ability to return to baseline activity level will improve Outcome: Progressing

## 2022-12-26 NOTE — Progress Notes (Signed)
Patient's blood pressure has been on higher ranger since morning, MD made aware of, PRN hydralazine ordered.

## 2022-12-26 NOTE — Progress Notes (Signed)
PROGRESS NOTE    Darren Rogers   BJY:[id number] DOB: April 06, 1956  DOA: 12/20/2022 Date of Service: 12/26/22 PCP: Lyndon Code, MD     Brief Narrative / Hospital Course:  Darren Rogers is a 67 y.o. 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  He also had shortness of breath and palpitations and admits to not taking his diltiazem as he should as he ran out of refills.   07/21: To ED, admitted to hospitalist service for A-fib/RVR, NSTEMI 07/22: Cardiology saw patient, acute CHF likely due to tachycardia versus alcohol.  Plan for right/left cardiac cath 07/23: Remains tachycardic, deferring cath until tomorrow 07/24: Right/left cardiac catheterization with Dr. Okey Dupre, severe CAD. Advised continue diuresis  07/25: HR still up, carvedilol increased, resume Eliquis and d/c heparin gtt 07/26: rate still high, adjusting to Toprol XL 100 mg bid and d/c Coreg  07/27: BP improved, cardiology considering addition of entresto possibly tomorrow. On po diuretics, maintaining fluid status.    Consultants:  Cardiology   Procedures: [facility] 12/23/22       ASSESSMENT & PLAN:   Principal Problem:   Chronic atrial fibrillation with RVR (HCC) Active Problems:   NSTEMI (non-ST elevated myocardial infarction) (HCC)   CAD (coronary artery disease)   Essential hypertension   PAD (peripheral artery disease) (HCC)   Rapid atrial fibrillation (HCC)   Alcohol use disorder, moderate, dependence (HCC)   Non-adherence to medical treatment   Acute HFrEF (heart failure with reduced ejection fraction) (HCC)   Elevated troponin   Atrial fibrillation with rapid ventricular response (HCC) CHA2DS2-VASc at least 4  Adjusting  rate control over past few days, per cardiology will start Toprol XL 100 mg bid today and d/c Coreg Eliquis 5 mg twice daily Digoxin, increased to 0.5 mg today  Cardiology following Avoid diltiazem w/ cardiomyopathy  If ventricular rates remain difficult to control, may need EP input for further management, query if AV nodal ablation is needed with PPM implantation   Cardiomyopathy with HFrEF and moderately elevated right heart pressures metoprolol, digoxin as above Lasix 20 mg po daily  Losarten  May need to hold losartan if low BP Weights, fluid restriction Cardiology following GDMT - add MRA and SGLT2i as able per cardiology  Daily weight, strict I&O BP improved, cardiology considering addition of entresto possibly tomorrow.    NSTEMI  CAD Status post LHC/RHC 12/23/2022 - Noted to have occlusion of distal left circumflex/PL branch with left to left collaterals. No intervention performed Cardiology following S/p heparin infusion Continue ASA, Rosuvastatin, other cardiac medications as able    Essential hypertension BP has been on the lower side her  Meds as above BP improved, cardiology considering addition of entresto possibly tomorrow.    History of cocaine use Urine drug screen negative Abstinence encouraged   Alcohol use disorder, moderate, dependence (HCC) Continue CIWA protocol Counseled on cutting back   DVT prophylaxis: Eliquis Pertinent IV fluids/nutrition: No continuous IV fluids Central lines / invasive devices: None  Code Status: Full code ACP documentation reviewed: 12/23/2022, none on file  Current Admission Status: Inpatient TOC needs / Dispo plan: Anticipate may need home health Barriers to discharge / significant pending items: Continuing gentle diuresis, cardiology following and will need clearance from them  prior to discharge, hopefully can send home tomorrow / Monday             Subjective / Brief ROS:  Patient reports no concerns SOB  is improved Denies CP.  Pain controlled.  Denies new weakness.  Tolerating diet.    Family Communication: None at this time    Objective Findings:  Vitals:   12/26/22 0005 12/26/22 0357 12/26/22 0730 12/26/22 1131  BP: (!) 154/108 (!) 151/87 (!) 167/96 (!) 150/97  Pulse: 99 98 100 (!) 102  Resp: 20 16 16 16   Temp: 98.1 F (36.7 C) 98.4 F (36.9 C) 97.7 F (36.5 C) 98.1 F (36.7 C)  TempSrc: Oral Oral    SpO2: 99% 100% 100% 100%  Weight:      Height:        Intake/Output Summary (Last 24 hours) at 12/26/2022 1510 Last data filed at 12/25/2022 1927 Gross per 24 hour  Intake 240 ml  Output --  Net 240 ml   Filed Weights   12/20/22 1746 12/22/22 1341 12/23/22 0400  Weight: 70.3 kg 55 kg 69 kg    Examination:  Physical Exam Constitutional:      General: He is not in acute distress. Cardiovascular:     Rate and Rhythm: Normal rate and regular rhythm.     Heart sounds: Heart sounds are distant.  Pulmonary:     Effort: Pulmonary effort is normal. No respiratory distress.     Breath sounds: Rales: improved some from yesterday.  Skin:    General: Skin is warm and dry.  Neurological:     General: No focal deficit present.     Mental Status: He is alert and oriented to person, place, and time.  Psychiatric:        Mood and Affect: Mood normal.        Behavior: Behavior normal.          Scheduled Medications:   apixaban  5 mg Oral BID   aspirin EC  81 mg Oral Daily   atorvastatin  40 mg Oral Daily   digoxin  0.25 mg Oral Daily   folic acid  1 mg Oral Daily   furosemide  20 mg Oral Daily   losartan  25 mg Oral Daily   metoprolol succinate  100 mg Oral BID   multivitamin with minerals  1 tablet Oral Daily   pantoprazole  40 mg Oral Daily   sodium chloride flush  3 mL Intravenous Q12H   spironolactone  12.5 mg Oral Daily   thiamine  100 mg Oral Daily   Or   thiamine  100 mg Intravenous Daily    Continuous Infusions:  sodium chloride      PRN  Medications:  sodium chloride, acetaminophen, nitroGLYCERIN, ondansetron (ZOFRAN) IV, polyethylene glycol, sodium chloride flush  Antimicrobials from admission:  Anti-infectives (From admission, onward)    None           Data Reviewed:  I have personally reviewed the following...  CBC: Recent Labs  Lab 12/20/22 1749 12/21/22 0625 12/21/22 1015 12/22/22 0900 12/23/22 0522 12/23/22 1225 12/23/22 1229  WBC 3.8* 5.2 4.9 5.4 4.5  --   --   NEUTROABS 1.9  --  2.8 2.6 2.4  --   --   HGB 13.4 12.7* 12.1* 12.3* 12.4* 12.9* 12.9*  HCT 37.9* 35.5* 34.4* 36.3* 34.8* 38.0* 38.0*  MCV 92.9 92.9 93.2 95.3 92.3  --   --   PLT 201 200 203 213 221  --   --  Basic Metabolic Panel: Recent Labs  Lab 12/21/22 1015 12/22/22 0900 12/23/22 0522 12/23/22 1225 12/23/22 1229 12/24/22 0511 12/25/22 0540  NA 135 135 133* 135 135 136 136  K 4.2 4.6 4.4 3.6 3.7 4.2 3.8  CL 105 104 101  --   --  104 105  CO2 22 24 24   --   --  23 24  GLUCOSE 161* 92 96  --   --  102* 126*  BUN 8 12 10   --   --  15 19  CREATININE 0.92 1.10 0.90  --   --  0.98 1.02  CALCIUM 8.7* 9.0 8.9  --   --  8.8* 9.0  MG  --   --   --   --   --  2.1  --    GFR: Estimated Creatinine Clearance: 63.4 mL/min (by C-G formula based on SCr of 1.02 mg/dL). Liver Function Tests: No results for input(s): "AST", "ALT", "ALKPHOS", "BILITOT", "PROT", "ALBUMIN" in the last 168 hours. No results for input(s): "LIPASE", "AMYLASE" in the last 168 hours. No results for input(s): "AMMONIA" in the last 168 hours. Coagulation Profile: Recent Labs  Lab 12/20/22 2144  INR 1.1   Cardiac Enzymes: No results for input(s): "CKTOTAL", "CKMB", "CKMBINDEX", "TROPONINI" in the last 168 hours. BNP (last 3 results) No results for input(s): "PROBNP" in the last 8760 hours. HbA1C: No results for input(s): "HGBA1C" in the last 72 hours. CBG: No results for input(s): "GLUCAP" in the last 168 hours. Lipid Profile: No results for input(s):  "CHOL", "HDL", "LDLCALC", "TRIG", "CHOLHDL", "LDLDIRECT" in the last 72 hours.  Thyroid Function Tests: No results for input(s): "TSH", "T4TOTAL", "FREET4", "T3FREE", "THYROIDAB" in the last 72 hours. Anemia Panel: No results for input(s): "VITAMINB12", "FOLATE", "FERRITIN", "TIBC", "IRON", "RETICCTPCT" in the last 72 hours. Most Recent Urinalysis On File:     Component Value Date/Time   COLORURINE STRAW (A) 09/20/2022 0534   APPEARANCEUR CLEAR (A) 09/20/2022 0534   APPEARANCEUR Clear 07/30/2016 1600   LABSPEC 1.002 (L) 09/20/2022 0534   LABSPEC 1.019 03/28/2014 1430   PHURINE 6.0 09/20/2022 0534   GLUCOSEU NEGATIVE 09/20/2022 0534   GLUCOSEU Negative 03/28/2014 1430   HGBUR NEGATIVE 09/20/2022 0534   BILIRUBINUR NEGATIVE 09/20/2022 0534   BILIRUBINUR Negative 07/30/2016 1600   BILIRUBINUR Negative 03/28/2014 1430   KETONESUR NEGATIVE 09/20/2022 0534   PROTEINUR NEGATIVE 09/20/2022 0534   NITRITE NEGATIVE 09/20/2022 0534   LEUKOCYTESUR NEGATIVE 09/20/2022 0534   LEUKOCYTESUR Negative 03/28/2014 1430   Sepsis Labs: @LABRCNTIP (procalcitonin:4,lacticidven:4) Microbiology: No results found for this or any previous visit (from the past 240 hour(s)).    Radiology Studies last 3 days: CARDIAC CATHETERIZATION  Result Date: 12/23/2022 Conclusions: Severe single-vessel coronary artery disease with chronic total occlusion of distal LCx/LPLA.  LPDA is supplied by left-to-left collaterals.  There is mild-moderate, non-obstructive disease involving the LAD. Upper normal to mildly elevated left and right heart filling pressures (LVEDP 17 mmHg, PCWP 18 mmHg, RA 7 mmHg). Moderate pulmonary hypertension (mean PA 37 mmHg, PVR 5 WU) Mildly reduced Fick cardiac output/index (CO 3.8 L/min, CI 2.1 L/min/m^2). Recommendations: Continue gentle diuresis. Increase metoprolol tartrate to 50 mg every 6 hours for improved rate control; transition to evidence-based beta blocker prior to discharge. Continue  digoxin. Increase losartan to 25 mg daily. Secondary prevention of coronary artery disease; there are no targets for revascularization. Yvonne Kendall, MD [facility]            LOS: 6 days  Time spent: 50 min    Sunnie Nielsen, DO [facility] 12/26/2022, 3:10 PM    Dictation software may have been used to generate the above note. Typos may occur and escape review in typed/dictated notes. Please contact Dr Lyn Hollingshead directly for clarity if needed.  Staff may message me via secure chat in Epic  but this may not receive an immediate response,  please page me for urgent matters!  If 7PM-7AM, please contact night coverage [email or web address]

## 2022-12-27 DIAGNOSIS — I5021 Acute systolic (congestive) heart failure: Secondary | ICD-10-CM | POA: Diagnosis not present

## 2022-12-27 DIAGNOSIS — I482 Chronic atrial fibrillation, unspecified: Secondary | ICD-10-CM | POA: Diagnosis not present

## 2022-12-27 LAB — BASIC METABOLIC PANEL
Anion gap: 9 (ref 5–15)
BUN: 16 mg/dL (ref 8–23)
CO2: 24 mmol/L (ref 22–32)
Calcium: 9.2 mg/dL (ref 8.9–10.3)
Chloride: 101 mmol/L (ref 98–111)
Creatinine, Ser: 1.03 mg/dL (ref 0.61–1.24)
GFR, Estimated: >60 mL/min (ref >60)
Glucose, Bld: 142 mg/dL — ABNORMAL HIGH (ref 70–99)
Potassium: 4.2 mmol/L (ref 3.5–5.1)
Sodium: 134 mmol/L — ABNORMAL LOW (ref 135–145)

## 2022-12-27 MED ORDER — SACUBITRIL-VALSARTAN 24-26 MG PO TABS
1.0000 | ORAL_TABLET | Freq: Two times a day (BID) | ORAL | Status: DC
  Administered 2022-12-27 – 2022-12-28 (×3): 1 via ORAL
  Filled 2022-12-27 (×3): qty 1

## 2022-12-27 MED ORDER — DOCUSATE SODIUM 100 MG PO CAPS
100.0000 mg | ORAL_CAPSULE | Freq: Every day | ORAL | Status: DC
  Administered 2022-12-27 – 2022-12-28 (×2): 100 mg via ORAL
  Filled 2022-12-27 (×2): qty 1

## 2022-12-27 NOTE — Progress Notes (Signed)
Rounding Note    Patient Name: Darren Rogers Date of Encounter: 12/27/2022  [facility] Cardiologist: Lorine Bears, MD   Subjective   No chest pain, dyspnea, or palpitations. Constipated.   Inpatient Medications    Scheduled Meds:  apixaban  5 mg Oral BID   aspirin EC  81 mg Oral Daily   atorvastatin  40 mg Oral Daily   digoxin  0.25 mg Oral Daily   folic acid  1 mg Oral Daily   furosemide  20 mg Oral Daily   metoprolol succinate  100 mg Oral BID   multivitamin with minerals  1 tablet Oral Daily   pantoprazole  40 mg Oral Daily   sacubitril-valsartan  1 tablet Oral BID   sodium chloride flush  3 mL Intravenous Q12H   spironolactone  12.5 mg Oral Daily   thiamine  100 mg Oral Daily   Or   thiamine  100 mg Intravenous Daily   Continuous Infusions:  sodium chloride     PRN Meds: sodium chloride, acetaminophen, hydrALAZINE, nitroGLYCERIN, ondansetron (ZOFRAN) IV, polyethylene glycol, sodium chloride flush   Vital Signs    Vitals:   12/26/22 1950 12/26/22 2354 12/27/22 0459 12/27/22 0804  BP: (!) 147/95 (!) 142/91 (!) 145/96 (!) 148/102  Pulse: (!) 105 (!) 104 99 63  Resp: 18 16 16 16   Temp: 98 F (36.7 C) 97.9 F (36.6 C) 98.1 F (36.7 C) 98.4 F (36.9 C)  TempSrc:  Oral Oral Oral  SpO2: 99% 100% 100% 100%  Weight:      Height:        Intake/Output Summary (Last 24 hours) at 12/27/2022 0843 Last data filed at 12/27/2022 0803 Gross per 24 hour  Intake --  Output 875 ml  Net -875 ml      12/23/2022    4:00 AM 12/22/2022    1:41 PM 12/20/2022    5:46 PM  Last 3 Weights  Weight (lbs) 152 lb 1.9 oz 121 lb 4.1 oz 154 lb 15.7 oz  Weight (kg) 69 kg 55 kg 70.3 kg      Telemetry    Atrial fibrillation rate 90s to 110s bpm with brief episodes into the 140s bpm - Personally Reviewed  ECG    No new tracings - Personally Reviewed  Physical Exam   GEN: No acute distress.   Neck: No JVD Cardiac: Tachycardic, irregularly irregular, no murmurs,  rubs, or gallops.  Respiratory: Clear to auscultation bilaterally. GI: Soft, nontender, non-distended  MS: No edema; No deformity. Neuro:  Nonfocal  Psych: Normal affect   Labs    High Sensitivity Troponin:   Recent Labs  Lab 12/20/22 1749 12/20/22 1953 12/20/22 2147  TROPONINIHS 12 101* 197*     Chemistry Recent Labs  Lab 12/23/22 0522 12/23/22 1225 12/23/22 1229 12/24/22 0511 12/25/22 0540  NA 133*   < > 135 136 136  K 4.4   < > 3.7 4.2 3.8  CL 101  --   --  104 105  CO2 24  --   --  23 24  GLUCOSE 96  --   --  102* 126*  BUN 10  --   --  15 19  CREATININE 0.90  --   --  0.98 1.02  CALCIUM 8.9  --   --  8.8* 9.0  MG  --   --   --  2.1  --   GFRNONAA >60  --   --  >60 >60  ANIONGAP  8  --   --  9 7   < > = values in this interval not displayed.    Lipids  Recent Labs  Lab 12/22/22 1136  CHOL 193  TRIG 76  HDL 64  LDLCALC 114*  CHOLHDL 3.0    Hematology Recent Labs  Lab 12/21/22 1015 12/22/22 0900 12/23/22 0522 12/23/22 1225 12/23/22 1229  WBC 4.9 5.4 4.5  --   --   RBC 3.69* 3.81* 3.77*  --   --   HGB 12.1* 12.3* 12.4* 12.9* 12.9*  HCT 34.4* 36.3* 34.8* 38.0* 38.0*  MCV 93.2 95.3 92.3  --   --   MCH 32.8 32.3 32.9  --   --   MCHC 35.2 33.9 35.6  --   --   RDW 16.7* 16.6* 15.9*  --   --   PLT 203 213 221  --   --    Thyroid No results for input(s): "TSH", "FREET4" in the last 168 hours.  BNP Recent Labs  Lab 12/20/22 1749  BNP 283.9*    DDimer No results for input(s): "DDIMER" in the last 168 hours.   Radiology     Cardiac Studies   2D echo 12/21/2022: 1. Left ventricular ejection fraction, by estimation, is 35 to 40%. The  left ventricle has moderately decreased function. The left ventricle  demonstrates global hypokinesis. There is mild left ventricular  hypertrophy. Left ventricular diastolic  parameters are indeterminate.   2. Right ventricular systolic function is normal. The right ventricular  size is normal. There is  moderately elevated pulmonary artery systolic  pressure. The estimated right ventricular systolic pressure is 53.4 mmHg.   3. Left atrial size was mildly dilated.   4. Right atrial size was mildly dilated.   5. The mitral valve is normal in structure. Mild to moderate mitral valve  regurgitation. No evidence of mitral stenosis.   6. Tricuspid valve regurgitation is moderate.   7. The aortic valve is normal in structure. Aortic valve regurgitation is  mild. No aortic stenosis is present.   8. Pulmonic valve regurgitation is moderate.   9. Aortic dilatation noted. There is mild dilatation of the aortic root,  measuring 42 mm.  10. Mildly dilated pulmonary artery.  11. The inferior vena cava is normal in size with <50% respiratory  variability, suggesting right atrial pressure of 8 mmHg.  __________  [facility] 12/23/2022: Conclusions: Severe single-vessel coronary artery disease with chronic total occlusion of distal LCx/LPLA.  LPDA is supplied by left-to-left collaterals.  There is mild-moderate, non-obstructive disease involving the LAD. Upper normal to mildly elevated left and right heart filling pressures (LVEDP 17 mmHg, PCWP 18 mmHg, RA 7 mmHg). Moderate pulmonary hypertension (mean PA 37 mmHg, PVR 5 WU) Mildly reduced Fick cardiac output/index (CO 3.8 L/min, CI 2.1 L/min/m^2).   Recommendations: Continue gentle diuresis. Increase metoprolol tartrate to 50 mg every 6 hours for improved rate control; transition to evidence-based beta blocker prior to discharge. Continue digoxin. Increase losartan to 25 mg daily. Secondary prevention of coronary artery disease; there are no targets for revascularization.  Patient Profile     Darren Rogers is a 67 y.o. male with a hx of persistent Afib, nonobstructive CAD, HTN, HLD, noncompliance with polysubstance abuse who is being seen 12/21/2022 for the evaluation of chest pain   Assessment & Plan   CAD involving the native coronary arteries with  stable angina:  -Presenting with severe chest pain, peak troponin 197 -Ejection fraction moderately reduced 35 to 40%  with moderately elevated right heart pressures -R/LHC this admission with severe single vessel CAD with CTO of the distal LCx/LPDA with LPDA supplied by left to left collaterals with medical management advised  -Aspirin, Lipitor -Post cath instructions   Permanent atrial fibrillation -Ventricular rates remain suboptimally and difficult to control, though are improving  -He has been switched back and forth between Coreg and metoprolol this admission -Continue Toprol XL 100 mg bid, as this is likely to offer better ventricular rate control -Avoid diltiazem use with cardiomyopathy  -With noncompliance with medications, including anticoagulation AAD is not a good option  -On Eliquis 5 mg bid  -CHA2DS2-VASc at least 4 -Now on digoxin 0.5 mg daily (digoxin level 0.5 on 7/25), recheck on 7/29 -Given longtime persistent atrial fibrillation, lower likelihood of restoring normal sinus rhythm -If ventricular rates remain difficult to control, may need EP input for further management, query if AV nodal ablation is needed with PPM implantation   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: -Appears euvolemic -Toprol XL as above -Oral Lasix 20 mg daily -Spironolactone  -Transition from losartan to Entresto 24/26 mg bid -RHC this admission with mildly elevated left and right heart filling pressures, moderate pulmonary hypertension and mildly reduced cardiac output/index -Escalate GDMT with addition of SGLT2i as able -Daily weights -Strict I/O -Maintain net equal fluid balance    HTN: -BP labile, though more recently elevated -Medications as above      For questions or updates, please contact [facility] Please consult [email or web address] for contact info under        Signed, Eula Listen, PA-C  12/27/2022, 8:43 AM

## 2022-12-27 NOTE — Progress Notes (Signed)
PROGRESS NOTE    Darren Rogers   VHQ:[id number] DOB: 02-06-1956  DOA: 12/20/2022 Date of Service: 12/27/22 PCP: Lyndon Code, MD     Brief Narrative / Hospital Course:  Darren Rogers is a 67 y.o. 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  He also had shortness of breath and palpitations and admits to not taking his diltiazem as he should as he ran out of refills.   07/21: To ED, admitted to hospitalist service for A-fib/RVR, NSTEMI 07/22: Cardiology saw patient, acute CHF likely due to tachycardia versus alcohol.  Plan for right/left cardiac cath 07/23: Remains tachycardic, deferring cath until tomorrow 07/24: Right/left cardiac catheterization with Dr. Okey Dupre, severe CAD. Advised continue diuresis  07/25: HR still up, carvedilol increased, resume Eliquis and d/c heparin gtt 07/26: rate still high, adjusting to Toprol XL 100 mg bid and d/c Coreg  07/27: BP improved, cardiology considering addition of entresto possibly tomorrow. On po diuretics, maintaining fluid status.  07/28: started Entresto today. Anticipate d/c tomorrow     Consultants:  Cardiology   Procedures: [facility] 12/23/22       ASSESSMENT & PLAN:   Principal Problem:   Chronic atrial fibrillation with RVR (HCC) Active Problems:   NSTEMI (non-ST elevated myocardial infarction) (HCC)   CAD (coronary artery disease)   Essential hypertension   PAD (peripheral artery disease) (HCC)   Rapid atrial fibrillation (HCC)   Alcohol use disorder, moderate, dependence (HCC)   Non-adherence to medical treatment   Acute HFrEF (heart failure with reduced ejection fraction) (HCC)   Elevated troponin   Atrial fibrillation with rapid  ventricular response (HCC) CHA2DS2-VASc at least 4  Adjusting rate control, per cardiology have started Toprol XL 100 and has been maintaining fairly well on this  Eliquis 5 mg twice daily Digoxin, increased to 0.5 mg  Cardiology following Avoid diltiazem w/ cardiomyopathy  If ventricular rates remain difficult to control, may need EP input for further management, query if AV nodal ablation is needed with PPM implantation   Cardiomyopathy with HFrEF and moderately elevated right heart pressures metoprolol, digoxin as above Lasix 20 mg po daily  Losartan d/c in favor of Entresto which was started morning 12/27/22   Daily weights, fluid restriction Cardiology following GDMT - MRA and SGLT2i    NSTEMI  CAD Status post LHC/RHC 12/23/2022 - Noted to have occlusion of distal left circumflex/PL branch with left to left collaterals. No intervention performed Cardiology following S/p heparin infusion Continue ASA, Rosuvastatin, other cardiac medications as able    Essential hypertension BP has been on the lower side her  Meds as above BP improved to normotensive then hypertensive, added Entresto today    History of cocaine use Urine drug screen negative Abstinence encouraged   Alcohol use disorder, moderate, dependence (HCC) Continue CIWA protocol Counseled on cutting back   DVT prophylaxis: Eliquis Pertinent IV fluids/nutrition: No continuous IV fluids Central lines / invasive devices: None  Code Status: Full code ACP documentation reviewed: 12/23/2022, none on file  Current Admission Status: Inpatient TOC needs / Dispo plan: no TOC needs at this time  Barriers to discharge / significant pending items: Continuing gentle diuresis, cardiology following and will need clearance from them prior to discharge, anticipate can send  home tomorrow             Subjective / Brief ROS:  Patient reports no concerns SOB has resolved  Denies CP.  Pain controlled.  Denies new  weakness.  Tolerating diet.  Ambulating well    Family Communication: None at this time    Objective Findings:  Vitals:   12/27/22 0459 12/27/22 0804 12/27/22 1134 12/27/22 1200  BP: (!) 145/96 (!) 148/102 (!) 159/99   Pulse: 99 63 (!) 122   Resp: 16 16 20 18   Temp: 98.1 F (36.7 C) 98.4 F (36.9 C) 97.7 F (36.5 C)   TempSrc: Oral Oral Oral   SpO2: 100% 100% 100%   Weight:      Height:        Intake/Output Summary (Last 24 hours) at 12/27/2022 1328 Last data filed at 12/27/2022 0803 Gross per 24 hour  Intake --  Output 875 ml  Net -875 ml   Filed Weights   12/20/22 1746 12/22/22 1341 12/23/22 0400  Weight: 70.3 kg 55 kg 69 kg    Examination:  Physical Exam Constitutional:      General: He is not in acute distress. Cardiovascular:     Rate and Rhythm: Normal rate. Rhythm irregular.     Heart sounds: Murmur heard.     Systolic murmur is present.  Pulmonary:     Effort: Pulmonary effort is normal. No respiratory distress.     Breath sounds: Rales: improved some from yesterday.  Skin:    General: Skin is warm and dry.  Neurological:     General: No focal deficit present.     Mental Status: He is alert and oriented to person, place, and time.  Psychiatric:        Mood and Affect: Mood normal.        Behavior: Behavior normal.          Scheduled Medications:   apixaban  5 mg Oral BID   aspirin EC  81 mg Oral Daily   atorvastatin  40 mg Oral Daily   digoxin  0.25 mg Oral Daily   docusate sodium  100 mg Oral Daily   folic acid  1 mg Oral Daily   furosemide  20 mg Oral Daily   metoprolol succinate  100 mg Oral BID   multivitamin with minerals  1 tablet Oral Daily   pantoprazole  40 mg Oral Daily   sacubitril-valsartan  1 tablet Oral BID   sodium chloride flush  3 mL Intravenous Q12H   spironolactone  12.5 mg Oral Daily   thiamine  100 mg Oral Daily   Or   thiamine  100 mg Intravenous Daily    Continuous Infusions:  sodium chloride      PRN  Medications:  sodium chloride, acetaminophen, hydrALAZINE, nitroGLYCERIN, ondansetron (ZOFRAN) IV, polyethylene glycol, sodium chloride flush  Antimicrobials from admission:  Anti-infectives (From admission, onward)    None           Data Reviewed:  I have personally reviewed the following...  CBC: Recent Labs  Lab 12/20/22 1749 12/21/22 0625 12/21/22 1015 12/22/22 0900 12/23/22 0522 12/23/22 1225 12/23/22 1229  WBC 3.8* 5.2 4.9 5.4 4.5  --   --   NEUTROABS 1.9  --  2.8 2.6 2.4  --   --   HGB 13.4 12.7* 12.1* 12.3* 12.4* 12.9* 12.9*  HCT 37.9* 35.5* 34.4* 36.3* 34.8* 38.0* 38.0*  MCV 92.9 92.9 93.2 95.3 92.3  --   --  PLT 201 200 203 213 221  --   --    Basic Metabolic Panel: Recent Labs  Lab 12/22/22 0900 12/23/22 0522 12/23/22 1225 12/23/22 1229 12/24/22 0511 12/25/22 0540 12/27/22 0904  NA 135 133* 135 135 136 136 134*  K 4.6 4.4 3.6 3.7 4.2 3.8 4.2  CL 104 101  --   --  104 105 101  CO2 24 24  --   --  23 24 24   GLUCOSE 92 96  --   --  102* 126* 142*  BUN 12 10  --   --  15 19 16   CREATININE 1.10 0.90  --   --  0.98 1.02 1.03  CALCIUM 9.0 8.9  --   --  8.8* 9.0 9.2  MG  --   --   --   --  2.1  --   --    GFR: Estimated Creatinine Clearance: 62.8 mL/min (by C-G formula based on SCr of 1.03 mg/dL). Liver Function Tests: No results for input(s): "AST", "ALT", "ALKPHOS", "BILITOT", "PROT", "ALBUMIN" in the last 168 hours. No results for input(s): "LIPASE", "AMYLASE" in the last 168 hours. No results for input(s): "AMMONIA" in the last 168 hours. Coagulation Profile: Recent Labs  Lab 12/20/22 2144  INR 1.1   Cardiac Enzymes: No results for input(s): "CKTOTAL", "CKMB", "CKMBINDEX", "TROPONINI" in the last 168 hours. BNP (last 3 results) No results for input(s): "PROBNP" in the last 8760 hours. HbA1C: No results for input(s): "HGBA1C" in the last 72 hours. CBG: No results for input(s): "GLUCAP" in the last 168 hours. Lipid Profile: No results  for input(s): "CHOL", "HDL", "LDLCALC", "TRIG", "CHOLHDL", "LDLDIRECT" in the last 72 hours.  Thyroid Function Tests: No results for input(s): "TSH", "T4TOTAL", "FREET4", "T3FREE", "THYROIDAB" in the last 72 hours. Anemia Panel: No results for input(s): "VITAMINB12", "FOLATE", "FERRITIN", "TIBC", "IRON", "RETICCTPCT" in the last 72 hours. Most Recent Urinalysis On File:     Component Value Date/Time   COLORURINE STRAW (A) 09/20/2022 0534   APPEARANCEUR CLEAR (A) 09/20/2022 0534   APPEARANCEUR Clear 07/30/2016 1600   LABSPEC 1.002 (L) 09/20/2022 0534   LABSPEC 1.019 03/28/2014 1430   PHURINE 6.0 09/20/2022 0534   GLUCOSEU NEGATIVE 09/20/2022 0534   GLUCOSEU Negative 03/28/2014 1430   HGBUR NEGATIVE 09/20/2022 0534   BILIRUBINUR NEGATIVE 09/20/2022 0534   BILIRUBINUR Negative 07/30/2016 1600   BILIRUBINUR Negative 03/28/2014 1430   KETONESUR NEGATIVE 09/20/2022 0534   PROTEINUR NEGATIVE 09/20/2022 0534   NITRITE NEGATIVE 09/20/2022 0534   LEUKOCYTESUR NEGATIVE 09/20/2022 0534   LEUKOCYTESUR Negative 03/28/2014 1430   Sepsis Labs: @LABRCNTIP (procalcitonin:4,lacticidven:4) Microbiology: No results found for this or any previous visit (from the past 240 hour(s)).    Radiology Studies last 3 days: No results found.           LOS: 7 days    Time spent: 50 min    Sunnie Nielsen, DO [facility] 12/27/2022, 1:28 PM    Dictation software may have been used to generate the above note. Typos may occur and escape review in typed/dictated notes. Please contact Dr Lyn Hollingshead directly for clarity if needed.  Staff may message me via secure chat in Epic  but this may not receive an immediate response,  please page me for urgent matters!  If 7PM-7AM, please contact night coverage [email or web address]

## 2022-12-28 DIAGNOSIS — I482 Chronic atrial fibrillation, unspecified: Secondary | ICD-10-CM | POA: Diagnosis not present

## 2022-12-28 MED ORDER — DIGOXIN 250 MCG PO TABS: 0.2500 mg | ORAL_TABLET | Freq: Every day | ORAL | 0 refills | Status: DC

## 2022-12-28 MED ORDER — VITAMIN B-1 100 MG PO TABS: 100.0000 mg | ORAL_TABLET | Freq: Every day | ORAL | 0 refills | Status: DC

## 2022-12-28 MED ORDER — FOLIC ACID 1 MG PO TABS: 1.0000 mg | ORAL_TABLET | Freq: Every day | ORAL | 0 refills | Status: DC

## 2022-12-28 MED ORDER — ASPIRIN 81 MG PO TBEC: 81.0000 mg | DELAYED_RELEASE_TABLET | Freq: Every day | ORAL | 0 refills | Status: DC

## 2022-12-28 MED ORDER — METOPROLOL SUCCINATE ER 100 MG PO TB24: 100.0000 mg | ORAL_TABLET | Freq: Two times a day (BID) | ORAL | 0 refills | Status: DC

## 2022-12-28 MED ORDER — PANTOPRAZOLE SODIUM 40 MG PO TBEC: 40.0000 mg | DELAYED_RELEASE_TABLET | Freq: Every day | ORAL | 0 refills | Status: DC

## 2022-12-28 MED ORDER — ATORVASTATIN CALCIUM 40 MG PO TABS: 40.0000 mg | ORAL_TABLET | Freq: Every day | ORAL | 0 refills | Status: DC

## 2022-12-28 MED ORDER — SPIRONOLACTONE 25 MG PO TABS: 12.5000 mg | ORAL_TABLET | Freq: Every day | ORAL | 0 refills | Status: DC

## 2022-12-28 MED ORDER — NITROGLYCERIN 0.4 MG SL SUBL: 0.4000 mg | SUBLINGUAL_TABLET | SUBLINGUAL | 0 refills | Status: AC | PRN

## 2022-12-28 MED ORDER — APIXABAN 5 MG PO TABS: 5.0000 mg | ORAL_TABLET | Freq: Two times a day (BID) | ORAL | 0 refills | Status: DC

## 2022-12-28 MED ORDER — ADULT MULTIVITAMIN W/MINERALS CH: 1.0000 | ORAL_TABLET | Freq: Every day | ORAL | Status: AC

## 2022-12-28 MED ORDER — FUROSEMIDE 20 MG PO TABS: 20.0000 mg | ORAL_TABLET | Freq: Every day | ORAL | 0 refills | Status: DC

## 2022-12-28 MED ORDER — SACUBITRIL-VALSARTAN 24-26 MG PO TABS: 1.0000 | ORAL_TABLET | Freq: Two times a day (BID) | ORAL | 0 refills | Status: DC

## 2022-12-28 NOTE — Plan of Care (Signed)
  Problem: Education: Goal: Understanding of cardiac disease, CV risk reduction, and recovery process will improve Outcome: Progressing Goal: Individualized Educational Video(s) Outcome: Progressing   Problem: Activity: Goal: Ability to tolerate increased activity will improve Outcome: Progressing   Problem: Cardiac: Goal: Ability to achieve and maintain adequate cardiovascular perfusion will improve Outcome: Progressing   Problem: Health Behavior/Discharge Planning: Goal: Ability to safely manage health-related needs after discharge will improve Outcome: Progressing   Problem: Activity: Goal: Ability to return to baseline activity level will improve Outcome: Progressing   Problem: Cardiovascular: Goal: Ability to achieve and maintain adequate cardiovascular perfusion will improve Outcome: Progressing

## 2022-12-28 NOTE — Discharge Summary (Signed)
Physician Discharge Summary   Patient: Darren Rogers MRN: [id number]  DOB: May 08, 1956   Admit:     Date of Admission: 12/20/2022 Admitted from: home   Discharge: Date of discharge: 12/28/22 Disposition: Home Condition at discharge: good  CODE STATUS: FULL CODE     Discharge Physician: Sunnie Nielsen, DO [facility]     PCP: Pcp, No  Recommendations for Outpatient Follow-up:  Follow up with PCP Pcp, No in 2-4 weeks Follow up with cardiology 1-2 weeks Please obtain labs/tests: CBC, BMP in 1-2 weeks on new meds  Please follow up on the following pending results: none PCP AND OTHER OUTPATIENT PROVIDERS: SEE BELOW FOR SPECIFIC DISCHARGE INSTRUCTIONS PRINTED FOR PATIENT IN ADDITION TO GENERIC AVS PATIENT INFO    Discharge Instructions     (HEART FAILURE PATIENTS) Call MD:  Anytime you have any of the following symptoms: 1) 3 pound weight gain in 24 hours or 5 pounds in 1 week 2) shortness of breath, with or without a dry hacking cough 3) swelling in the hands, feet or stomach 4) if you have to sleep on extra pillows at night in order to breathe.   Complete by: As directed    AMB referral to CHF clinic   Complete by: As directed    Reason for referral: Systolic HF   Amb referral to AFIB Clinic   Complete by: As directed    Diet - low sodium heart healthy   Complete by: As directed    Fluid restrict ideally 1500 mL/day but no more than 1800 mL/day   Increase activity slowly   Complete by: As directed          Discharge Diagnoses: Principal Problem:   Chronic atrial fibrillation with RVR (HCC) Active Problems:   NSTEMI (non-ST elevated myocardial infarction) (HCC)   CAD (coronary artery disease)   Essential hypertension   PAD (peripheral artery disease) (HCC)   Rapid atrial fibrillation (HCC)   Alcohol use disorder, moderate, dependence (HCC)   Non-adherence to medical treatment   Acute HFrEF (heart failure with reduced ejection fraction) (HCC)    Elevated troponin       Hospital Course: Darren Rogers is a 67 y.o. 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  He also had shortness of breath and palpitations and admits to not taking his diltiazem as he should as he ran out of refills.   07/21: To ED, admitted to hospitalist service for A-fib/RVR, NSTEMI 07/22: Cardiology saw patient, acute CHF likely due to tachycardia versus alcohol.  Plan for right/left cardiac cath 07/23: Remains tachycardic, deferring cath until tomorrow 07/24: Right/left cardiac catheterization with Dr. Okey Dupre, severe CAD. Advised continue diuresis  07/25: HR still up, carvedilol increased, resume Eliquis and d/c heparin gtt 07/26: rate still high, adjusting to Toprol XL 100 mg bid and d/c Coreg  07/27: BP improved, cardiology considering addition of entresto possibly tomorrow. On po diuretics, maintaining fluid status.  07/28: started Entresto today. Anticipate d/c tomorrow  07/29: BP improved, stable for discharge     Consultants:  Cardiology   Procedures: [facility] 12/23/22       ASSESSMENT & PLAN:   Atrial fibrillation with rapid ventricular response (HCC) CHA2DS2-VASc at least 4  Adjusting  rate control, doing well on Toprol XL 100 mg bid  Eliquis 5 mg twice daily Digoxin, increased to 0.5 mg  Cardiology following Avoid diltiazem w/ cardiomyopathy   Cardiomyopathy with HFrEF and moderately elevated right heart pressures metoprolol, digoxin as above Lasix 20 mg po daily  Losartan d/c in favor of Entresto which was started morning 12/27/22  may consider titrate up on this  Daily weights, fluid restriction Cardiology following GDMT - MRA and SGLT2i per cardiology  outpatient    NSTEMI  CAD Status post LHC/RHC 12/23/2022 - Noted to have occlusion of distal left circumflex/PL branch with left to left collaterals. No intervention performed S/p heparin infusion Continue ASA, Rosuvastatin, other cardiac medications as above    Essential hypertension BP has been on the lower side her  Meds as above BP improved to normotensive then hypertensive, added Entresto    History of cocaine use Urine drug screen negative Abstinence encouraged   Alcohol use disorder, moderate, dependence (HCC) S/p CIWA protocol Counseled on cutting back           Discharge Instructions  Allergies as of 12/28/2022   No Known Allergies      Medication List     STOP taking these medications    diltiazem 180 MG 24 hr capsule Commonly known as: CARDIZEM CD   metoprolol tartrate 100 MG tablet Commonly known as: LOPRESSOR       TAKE these medications    apixaban 5 MG Tabs tablet Commonly known as: ELIQUIS Take 1 tablet (5 mg total) by mouth 2 (two) times daily.   aspirin EC 81 MG tablet Take 1 tablet (81 mg total) by mouth daily. Swallow whole.   atorvastatin 40 MG tablet Commonly known as: LIPITOR Take 1 tablet (40 mg total) by mouth daily.   digoxin 0.25 MG tablet Commonly known as: LANOXIN Take 1 tablet (0.25 mg total) by mouth daily. Start taking on: December 29, 2022   folic acid 1 MG tablet Commonly known as: FOLVITE Take 1 tablet (1 mg total) by mouth daily. Start taking on: December 29, 2022   furosemide 20 MG tablet Commonly known as: LASIX Take 1 tablet (20 mg total) by mouth daily. Increase to 1 tablet (20 mg total) by mouth TWICE daily (total daily dose 40 mg) as needed for up to 3 days for increased leg swelling, shortness of breath, weight gain 5+ lbs over 1-2 days. Seek medical care if these symptoms are not improving with increased dose. Start taking on: December 29, 2022   metoprolol succinate 100 MG 24 hr tablet Commonly known as:  TOPROL-XL Take 1 tablet (100 mg total) by mouth 2 (two) times daily. Take with or immediately following a meal.   multivitamin with minerals Tabs tablet Take 1 tablet by mouth daily. Start taking on: December 29, 2022   nitroGLYCERIN 0.4 MG SL tablet Commonly known as: NITROSTAT Place 1 tablet (0.4 mg total) under the tongue every 5 (five) minutes x 3 doses as needed for chest pain.   pantoprazole 40 MG tablet Commonly known as: PROTONIX Take 1 tablet (40 mg total) by mouth daily.   polyethylene glycol 17 g packet Commonly known as: MIRALAX / GLYCOLAX Take 17 g by mouth daily as needed.   sacubitril-valsartan 24-26 MG Commonly known as: ENTRESTO Take 1 tablet by mouth 2 (two) times daily.   spironolactone 25 MG tablet Commonly known as: ALDACTONE Take 0.5 tablets (12.5 mg total) by mouth daily. Start taking on: December 29, 2022  thiamine 100 MG tablet Commonly known as: Vitamin B-1 Take 1 tablet (100 mg total) by mouth daily. Start taking on: December 29, 2022               Durable Medical Equipment  (From admission, onward)           Start     Ordered   12/24/22 1544  For home use only DME 4 wheeled rolling walker with seat  Once       Question Answer Comment  Patient needs a walker to treat with the following condition ESRD (end stage renal disease) (HCC)   Patient needs a walker to treat with the following condition Debility   Patient needs a walker to treat with the following condition Ambulatory dysfunction   Patient needs a walker to treat with the following condition HTN (hypertension)      12/24/22 1544             Follow-up Information     End, Cristal Deer, MD. Go to.   Specialty: Cardiology Why: Appointment on Monday, 01/11/2023 at 8:50am with Eula Listen, PA. Contact information: [address] [place] [address] [phone]                 No Known Allergies   Subjective: pt feeling great, eager for discharge home,  walking well, no SOB, on CP   Discharge Exam: BP 123/87 (BP Location: Left Arm)   Pulse (!) 102   Temp 98 F (36.7 C)   Resp 18   Ht 5\' 6"  (1.676 m)   Wt 69 kg   SpO2 99%   BMI 24.55 kg/m  General: Pt is alert, awake, not in acute distress Cardiovascular: irreg/irreg, no rubs, no gallops Respiratory: CTA bilaterally, no wheezing, no rhonchi Abdominal: Soft, NT, ND, bowel sounds + Extremities: no edema, no cyanosis     The results of significant diagnostics from this hospitalization (including imaging, microbiology, ancillary and laboratory) are listed below for reference.     Microbiology: No results found for this or any previous visit (from the past 240 hour(s)).   Labs: BNP (last 3 results) Recent Labs    12/20/22 1749  BNP 283.9*   Basic Metabolic Panel: Recent Labs  Lab 12/22/22 0900 12/23/22 0522 12/23/22 1225 12/23/22 1229 12/24/22 0511 12/25/22 0540 12/27/22 0904  NA 135 133* 135 135 136 136 134*  K 4.6 4.4 3.6 3.7 4.2 3.8 4.2  CL 104 101  --   --  104 105 101  CO2 24 24  --   --  23 24 24   GLUCOSE 92 96  --   --  102* 126* 142*  BUN 12 10  --   --  15 19 16   CREATININE 1.10 0.90  --   --  0.98 1.02 1.03  CALCIUM 9.0 8.9  --   --  8.8* 9.0 9.2  MG  --   --   --   --  2.1  --   --    Liver Function Tests: No results for input(s): "AST", "ALT", "ALKPHOS", "BILITOT", "PROT", "ALBUMIN" in the last 168 hours. No results for input(s): "LIPASE", "AMYLASE" in the last 168 hours. No results for input(s): "AMMONIA" in the last 168 hours. CBC: Recent Labs  Lab 12/22/22 0900 12/23/22 0522 12/23/22 1225 12/23/22 1229  WBC 5.4 4.5  --   --   NEUTROABS 2.6 2.4  --   --   HGB 12.3* 12.4* 12.9* 12.9*  HCT 36.3* 34.8* 38.0* 38.0*  MCV 95.3 92.3  --   --   PLT 213 221  --   --    Cardiac Enzymes: No results for input(s): "CKTOTAL", "CKMB", "CKMBINDEX", "TROPONINI" in the last 168 hours. BNP: Invalid input(s): "POCBNP" CBG: No results for input(s):  "GLUCAP" in the last 168 hours. D-Dimer No results for input(s): "DDIMER" in the last 72 hours. Hgb A1c No results for input(s): "HGBA1C" in the last 72 hours. Lipid Profile No results for input(s): "CHOL", "HDL", "LDLCALC", "TRIG", "CHOLHDL", "LDLDIRECT" in the last 72 hours. Thyroid function studies No results for input(s): "TSH", "T4TOTAL", "T3FREE", "THYROIDAB" in the last 72 hours.  Invalid input(s): "FREET3" Anemia work up No results for input(s): "VITAMINB12", "FOLATE", "FERRITIN", "TIBC", "IRON", "RETICCTPCT" in the last 72 hours. Urinalysis    Component Value Date/Time   COLORURINE STRAW (A) 09/20/2022 0534   APPEARANCEUR CLEAR (A) 09/20/2022 0534   APPEARANCEUR Clear 07/30/2016 1600   LABSPEC 1.002 (L) 09/20/2022 0534   LABSPEC 1.019 03/28/2014 1430   PHURINE 6.0 09/20/2022 0534   GLUCOSEU NEGATIVE 09/20/2022 0534   GLUCOSEU Negative 03/28/2014 1430   HGBUR NEGATIVE 09/20/2022 0534   BILIRUBINUR NEGATIVE 09/20/2022 0534   BILIRUBINUR Negative 07/30/2016 1600   BILIRUBINUR Negative 03/28/2014 1430   KETONESUR NEGATIVE 09/20/2022 0534   PROTEINUR NEGATIVE 09/20/2022 0534   NITRITE NEGATIVE 09/20/2022 0534   LEUKOCYTESUR NEGATIVE 09/20/2022 0534   LEUKOCYTESUR Negative 03/28/2014 1430   Sepsis Labs Recent Labs  Lab 12/22/22 0900 12/23/22 0522  WBC 5.4 4.5   Microbiology No results found for this or any previous visit (from the past 240 hour(s)). Imaging ECHOCARDIOGRAM COMPLETE  Result Date: 12/21/2022    ECHOCARDIOGRAM REPORT   Patient Name:   Darren Rogers Date of Exam: 12/21/2022 Medical Rec #:  [id number]    Height:       66.0 in Accession #:    [id number]   Weight:       155.0 lb Date of Birth:  Sep 18, 1955   BSA:          1.794 m Patient Age:    46 years     BP:           115/80 mmHg Patient Gender: M            HR:           109 bpm. Exam Location:  [facility] Procedure: 2D Echo, Cardiac Doppler and Color Doppler Indications:     NSTEMI  History:         Patient has  prior history of Echocardiogram examinations, most                  recent 03/11/2021. Acute MI and CAD, PAD, Arrythmias:Atrial                  Fibrillation, Signs/Symptoms:Chest Pain; Risk                  Factors:Hypertension, Dyslipidemia and Current Smoker.                  Polysubstance abuse.  Sonographer:     Mikki Harbor Referring Phys:  [id number] Andris Baumann Diagnosing Phys: Lorine Bears MD IMPRESSIONS  1. Left ventricular ejection fraction, by estimation, is 35 to 40%. The left ventricle has moderately decreased function. The left ventricle demonstrates global hypokinesis. There is mild left ventricular hypertrophy. Left ventricular diastolic parameters are indeterminate.  2. Right ventricular systolic function is normal. The  right ventricular size is normal. There is moderately elevated pulmonary artery systolic pressure. The estimated right ventricular systolic pressure is 53.4 mmHg.  3. Left atrial size was mildly dilated.  4. Right atrial size was mildly dilated.  5. The mitral valve is normal in structure. Mild to moderate mitral valve regurgitation. No evidence of mitral stenosis.  6. Tricuspid valve regurgitation is moderate.  7. The aortic valve is normal in structure. Aortic valve regurgitation is mild. No aortic stenosis is present.  8. Pulmonic valve regurgitation is moderate.  9. Aortic dilatation noted. There is mild dilatation of the aortic root, measuring 42 mm. 10. Mildly dilated pulmonary artery. 11. The inferior vena cava is normal in size with <50% respiratory variability, suggesting right atrial pressure of 8 mmHg. FINDINGS  Left Ventricle: Left ventricular ejection fraction, by estimation, is 35 to 40%. The left ventricle has moderately decreased function. The left ventricle demonstrates global hypokinesis. The left ventricular internal cavity size was normal in size. There is mild left ventricular hypertrophy. Left ventricular diastolic parameters are indeterminate. Right  Ventricle: The right ventricular size is normal. No increase in right ventricular wall thickness. Right ventricular systolic function is normal. There is moderately elevated pulmonary artery systolic pressure. The tricuspid regurgitant velocity is 3.37 m/s, and with an assumed right atrial pressure of 8 mmHg, the estimated right ventricular systolic pressure is 53.4 mmHg. Left Atrium: Left atrial size was mildly dilated. Right Atrium: Right atrial size was mildly dilated. Pericardium: There is no evidence of pericardial effusion. Mitral Valve: The mitral valve is normal in structure. Mild to moderate mitral valve regurgitation. No evidence of mitral valve stenosis. MV peak gradient, 4.2 mmHg. The mean mitral valve gradient is 2.0 mmHg. Tricuspid Valve: The tricuspid valve is normal in structure. Tricuspid valve regurgitation is moderate . No evidence of tricuspid stenosis. Aortic Valve: The aortic valve is normal in structure. Aortic valve regurgitation is mild. No aortic stenosis is present. Aortic valve mean gradient measures 2.3 mmHg. Aortic valve peak gradient measures 5.2 mmHg. Aortic valve area, by VTI measures 2.40 cm. Pulmonic Valve: The pulmonic valve was normal in structure. Pulmonic valve regurgitation is moderate. No evidence of pulmonic stenosis. Aorta: Aortic dilatation noted. There is mild dilatation of the aortic root, measuring 42 mm. Pulmonary Artery: The pulmonary artery is mildly dilated. Venous: The inferior vena cava is normal in size with less than 50% respiratory variability, suggesting right atrial pressure of 8 mmHg. IAS/Shunts: No atrial level shunt detected by color flow Doppler.  LEFT VENTRICLE PLAX 2D LVIDd:         4.90 cm LVIDs:         3.90 cm LV PW:         1.20 cm LV IVS:        1.00 cm LVOT diam:     2.00 cm LV SV:         44 LV SV Index:   25 LVOT Area:     3.14 cm  RIGHT VENTRICLE RV Basal diam:  3.70 cm RV Mid diam:    3.20 cm RV S prime:     10.00 cm/s LEFT ATRIUM              Index        RIGHT ATRIUM           Index LA diam:        3.50 cm 1.95 cm/m   RA Area:     20.00 cm LA Vol (  A2C):   56.7 ml 31.60 ml/m  RA Volume:   56.80 ml  31.65 ml/m LA Vol (A4C):   64.2 ml 35.78 ml/m LA Biplane Vol: 63.9 ml 35.61 ml/m  AORTIC VALVE                    PULMONIC VALVE AV Area (Vmax):    2.51 cm     PV Vmax:       0.75 m/s AV Area (Vmean):   2.57 cm     PV Peak grad:  2.2 mmHg AV Area (VTI):     2.40 cm AV Vmax:           114.43 cm/s AV Vmean:          68.900 cm/s AV VTI:            0.185 m AV Peak Grad:      5.2 mmHg AV Mean Grad:      2.3 mmHg LVOT Vmax:         91.35 cm/s LVOT Vmean:        56.350 cm/s LVOT VTI:          0.142 m LVOT/AV VTI ratio: 0.76  AORTA Ao Root diam: 4.30 cm Ao Asc diam:  3.70 cm MITRAL VALVE                TRICUSPID VALVE MV Area (PHT): 6.04 cm     TR Peak grad:   45.4 mmHg MV Area VTI:   2.75 cm     TR Vmax:        337.00 cm/s MV Peak grad:  4.2 mmHg MV Mean grad:  2.0 mmHg     SHUNTS MV Vmax:       1.02 m/s     Systemic VTI:  0.14 m MV Vmean:      58.4 cm/s    Systemic Diam: 2.00 cm MV Decel Time: 126 msec MV E velocity: 109.75 cm/s Lorine Bears MD Electronically signed by Lorine Bears MD Signature Date/Time: 12/21/2022/2:10:48 PM    Final       Time coordinating discharge: over 30 minutes  SIGNED:  Sunnie Nielsen DO [facility]

## 2023-01-10 NOTE — Progress Notes (Deleted)
Cardiology Office Note    Date:  01/10/2023   ID:  Darren Rogers, DOB [birth year], MRN [id number]  PCP:  Pcp, No  Cardiologist:  Lorine Bears, MD  Electrophysiologist:  None   Chief Complaint: Hospital follow up  History of Present Illness:   Darren Rogers is a 67 y.o. male with history of ***  Troponin trended to 197 BNP 283  ***   Labs independently reviewed: 11/2022 - digoxin 1.2, potassium 4.2, BUN 16, SCr 1.03, magnesium 2.1, Hgb 12.4, PLT 221, TC 193, TG 76, HDL 64, LDL 114, LP (a) 235 07/2021 - TSH normal  Past Medical History:  Diagnosis Date   Claudication (HCC)    a. 06/2015 ABI: R - 0.73, L - 0.73. 30-49% bilat SFA stenosis. 50-74% R Profunda stenosis; b. 01/2017 ABI: R 0.89, L 0.88, TBI R 0.65, L 1.0.   Erectile dysfunction    Essential hypertension    History of echocardiogram    a. 03/29/2014: EF 55-60%, mild LVH, normal RVSP;  b. 05/2015 Echo: EF 60-65%, triv AI, mild MR; c. 03/2021 Echo: EF 55-60%, no rwma, mild LVH, nl RV fxn, mildly dil LA, mild MR.   Hyperlipidemia    Non-obstructive CAD    a. 01/27/2014 Cath: LM nl, LAD mild diff dz w/o obs, LCX no sig obs - scattered 20-30% mLCx, RCA no obs dz, EF 70%; b. 06/2015 MV; EF 60%, no ischemia; c. 07/2020 MV: No ischemia/scar.   Persistent atrial fibrillation (HCC)    a. Pt says Dx >77yrs ago w/ ? RFCA @ [facility];  b. Recurrent 12/2013;  b. Rx Flecainide and Xarelto-->Intermittent compliance; c. 03/2021 Zio: 100% Afib w/ avg rate of 99 bpm. Rare PVCs.   PVC's (premature ventricular contractions)    a. 05/2017 Zio: 4000 PVCs in 48 hrs (2%). Brief run of SVT.   Tobacco abuse    a. ongoing - 1 ppd.    Past Surgical History:  Procedure Laterality Date   CARDIAC CATHETERIZATION     CARDIAC CATHETERIZATION     [facility]   LEFT HEART CATH Right 01/27/2014   Procedure: LEFT HEART CATH;  Surgeon: Micheline Chapman, MD;  Location: [facility] CATH LAB;  Service: Cardiovascular;  Laterality: Right;   RIGHT/LEFT HEART CATH AND CORONARY  ANGIOGRAPHY N/A 12/23/2022   Procedure: RIGHT/LEFT HEART CATH AND CORONARY ANGIOGRAPHY;  Surgeon: Yvonne Kendall, MD;  Location: [facility] INVASIVE CV LAB;  Service: Cardiovascular;  Laterality: N/A;    Current Medications: No outpatient medications have been marked as taking for the 01/11/23 encounter (Appointment) with Sondra Barges, PA-C.    Allergies:   Patient has no known allergies.   Social History   Socioeconomic History   Marital status: Single    Spouse name: Not on file   Number of children: Not on file   Years of education: Not on file   Highest education level: Not on file  Occupational History   Not on file  Tobacco Use   Smoking status: Every Day    Current packs/day: 0.50    Average packs/day: 0.5 packs/day for 30.0 years (15.0 ttl pk-yrs)    Types: Cigarettes   Smokeless tobacco: Never  Vaping Use   Vaping status: Never Used  Substance and Sexual Activity   Alcohol use: Yes    Comment: 2 40 ounce beers a day   Drug use: Not Currently    Types: Cocaine   Sexual activity: Not on file  Other Topics Concern   Not on file  Social History Narrative   The patient lives in [place] with his sister. He works in a substance abuse program. 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 He is a long-time smoker, one pack per day. No illicit drugs. He is not married.   Social Determinants of Health   Financial Resource Strain: Not on file  Food Insecurity: No Food Insecurity (12/23/2022)   Hunger Vital Sign    Worried About Running Out of Food in the Last Year: Never true    Ran Out of Food in the Last Year: Never true  Transportation Needs: No Transportation Needs (12/23/2022)   PRAPARE - Administrator, Civil Service (Medical): No    Lack of Transportation (Non-Medical): No  Physical Activity: Not on file  Stress: Not on file  Social Connections: Not on file     Family History:  The patient's family history includes  Coronary artery disease (age of onset: 39) in his father; Diabetes in his brother, maternal grandmother, mother, sister, sister, and sister.  ROS:   12-point review of systems is negative unless otherwise noted in the HPI.   EKGs/Labs/Other Studies Reviewed:    Studies reviewed were summarized above. The additional studies were reviewed today:  Cardiac Studies & Procedures   CARDIAC CATHETERIZATION  CARDIAC CATHETERIZATION 12/23/2022  Narrative Conclusions: Severe single-vessel coronary artery disease with chronic total occlusion of distal LCx/LPLA.  LPDA is supplied by left-to-left collaterals.  There is mild-moderate, non-obstructive disease involving the LAD. Upper normal to mildly elevated left and right heart filling pressures (LVEDP 17 mmHg, PCWP 18 mmHg, RA 7 mmHg). Moderate pulmonary hypertension (mean PA 37 mmHg, PVR 5 WU) Mildly reduced Fick cardiac output/index (CO 3.8 L/min, CI 2.1 L/min/m^2).  Recommendations: Continue gentle diuresis. Increase metoprolol tartrate to 50 mg every 6 hours for improved rate control; transition to evidence-based beta blocker prior to discharge. Continue digoxin. Increase losartan to 25 mg daily. Secondary prevention of coronary artery disease; there are no targets for revascularization.  Yvonne Kendall, MD [facility]  Findings Coronary Findings Diagnostic  Dominance: Left  Left Main Vessel is large. Vessel is angiographically normal.  Left Anterior Descending Vessel is large. Ost LAD lesion is 35% stenosed. Prox LAD to Mid LAD lesion is 35% stenosed. The lesion is mildly calcified.  First Diagonal Branch Vessel is small in size. There is mild disease in the vessel.  Second Diagonal Branch Vessel is small in size.  Left Circumflex Vessel is large. Dist Cx lesion is 20% stenosed.  First Obtuse Marginal Branch Vessel is small in size.  Second Obtuse Marginal Branch Vessel is small in size.  Lateral Second Obtuse  Marginal Branch Vessel is small in size.  Left Posterior Descending Artery Vessel is small in size. Collaterals LPDA filled by collaterals from Dist LAD.  First Left Posterolateral Branch Vessel is moderate in size.  Left Posterior Atrioventricular Artery LPAV lesion is 100% stenosed. The lesion is chronically occluded.  Right Coronary Artery Vessel is small. Vessel is angiographically normal.  Intervention  No interventions have been documented.   STRESS TESTS  NM MYOCAR MULTI W/SPECT W 08/06/2020  Narrative  Normal pharmacologic myocardial perfusion stress test without evidence of significant ischemia or scar.  Grossly normal left ventricular systolic function by visual estimation. Calculated LVEF is unreliable due to gating issues related to atrial fibrillation.  Coronary artery calcification and aortic atherosclerosis are noted on the attenuation correction CT.  This is a low  risk study.   ECHOCARDIOGRAM  ECHOCARDIOGRAM COMPLETE 12/21/2022  Narrative ECHOCARDIOGRAM REPORT    Patient Name:   Darren Rogers Date of Exam: 12/21/2022 Medical Rec #:  [id number]    Height:       66.0 in Accession #:    [id number]   Weight:       155.0 lb Date of Birth:  [birth year]   BSA:          1.794 m Patient Age:    73 years     BP:           115/80 mmHg Patient Gender: M            HR:           109 bpm. Exam Location:  [facility]  Procedure: 2D Echo, Cardiac Doppler and Color Doppler  Indications:     NSTEMI  History:         Patient has prior history of Echocardiogram examinations, most recent 03/11/2021. Acute MI and CAD, PAD, Arrythmias:Atrial Fibrillation, Signs/Symptoms:Chest Pain; Risk Factors:Hypertension, Dyslipidemia and Current Smoker. Polysubstance abuse.  Sonographer:     Mikki Harbor Referring Phys:  [id number] Andris Baumann Diagnosing Phys: Lorine Bears MD  IMPRESSIONS   1. Left ventricular ejection fraction, by estimation, is 35 to 40%. The left  ventricle has moderately decreased function. The left ventricle demonstrates global hypokinesis. There is mild left ventricular hypertrophy. Left ventricular diastolic parameters are indeterminate. 2. Right ventricular systolic function is normal. The right ventricular size is normal. There is moderately elevated pulmonary artery systolic pressure. The estimated right ventricular systolic pressure is 53.4 mmHg. 3. Left atrial size was mildly dilated. 4. Right atrial size was mildly dilated. 5. The mitral valve is normal in structure. Mild to moderate mitral valve regurgitation. No evidence of mitral stenosis. 6. Tricuspid valve regurgitation is moderate. 7. The aortic valve is normal in structure. Aortic valve regurgitation is mild. No aortic stenosis is present. 8. Pulmonic valve regurgitation is moderate. 9. Aortic dilatation noted. There is mild dilatation of the aortic root, measuring 42 mm. 10. Mildly dilated pulmonary artery. 11. The inferior vena cava is normal in size with <50% respiratory variability, suggesting right atrial pressure of 8 mmHg.  FINDINGS Left Ventricle: Left ventricular ejection fraction, by estimation, is 35 to 40%. The left ventricle has moderately decreased function. The left ventricle demonstrates global hypokinesis. The left ventricular internal cavity size was normal in size. There is mild left ventricular hypertrophy. Left ventricular diastolic parameters are indeterminate.  Right Ventricle: The right ventricular size is normal. No increase in right ventricular wall thickness. Right ventricular systolic function is normal. There is moderately elevated pulmonary artery systolic pressure. The tricuspid regurgitant velocity is 3.37 m/s, and with an assumed right atrial pressure of 8 mmHg, the estimated right ventricular systolic pressure is 53.4 mmHg.  Left Atrium: Left atrial size was mildly dilated.  Right Atrium: Right atrial size was mildly  dilated.  Pericardium: There is no evidence of pericardial effusion.  Mitral Valve: The mitral valve is normal in structure. Mild to moderate mitral valve regurgitation. No evidence of mitral valve stenosis. MV peak gradient, 4.2 mmHg. The mean mitral valve gradient is 2.0 mmHg.  Tricuspid Valve: The tricuspid valve is normal in structure. Tricuspid valve regurgitation is moderate . No evidence of tricuspid stenosis.  Aortic Valve: The aortic valve is normal in structure. Aortic valve regurgitation is mild. No aortic stenosis is present. Aortic valve mean gradient measures 2.3 mmHg. Aortic valve peak  gradient measures 5.2 mmHg. Aortic valve area, by VTI measures 2.40 cm.  Pulmonic Valve: The pulmonic valve was normal in structure. Pulmonic valve regurgitation is moderate. No evidence of pulmonic stenosis.  Aorta: Aortic dilatation noted. There is mild dilatation of the aortic root, measuring 42 mm.  Pulmonary Artery: The pulmonary artery is mildly dilated.  Venous: The inferior vena cava is normal in size with less than 50% respiratory variability, suggesting right atrial pressure of 8 mmHg.  IAS/Shunts: No atrial level shunt detected by color flow Doppler.   LEFT VENTRICLE PLAX 2D LVIDd:         4.90 cm LVIDs:         3.90 cm LV PW:         1.20 cm LV IVS:        1.00 cm LVOT diam:     2.00 cm LV SV:         44 LV SV Index:   25 LVOT Area:     3.14 cm   RIGHT VENTRICLE RV Basal diam:  3.70 cm RV Mid diam:    3.20 cm RV S prime:     10.00 cm/s  LEFT ATRIUM             Index        RIGHT ATRIUM           Index LA diam:        3.50 cm 1.95 cm/m   RA Area:     20.00 cm LA Vol (A2C):   56.7 ml 31.60 ml/m  RA Volume:   56.80 ml  31.65 ml/m LA Vol (A4C):   64.2 ml 35.78 ml/m LA Biplane Vol: 63.9 ml 35.61 ml/m AORTIC VALVE                    PULMONIC VALVE AV Area (Vmax):    2.51 cm     PV Vmax:       0.75 m/s AV Area (Vmean):   2.57 cm     PV Peak grad:  2.2 mmHg AV  Area (VTI):     2.40 cm AV Vmax:           114.43 cm/s AV Vmean:          68.900 cm/s AV VTI:            0.185 m AV Peak Grad:      5.2 mmHg AV Mean Grad:      2.3 mmHg LVOT Vmax:         91.35 cm/s LVOT Vmean:        56.350 cm/s LVOT VTI:          0.142 m LVOT/AV VTI ratio: 0.76  AORTA Ao Root diam: 4.30 cm Ao Asc diam:  3.70 cm  MITRAL VALVE                TRICUSPID VALVE MV Area (PHT): 6.04 cm     TR Peak grad:   45.4 mmHg MV Area VTI:   2.75 cm     TR Vmax:        337.00 cm/s MV Peak grad:  4.2 mmHg MV Mean grad:  2.0 mmHg     SHUNTS MV Vmax:       1.02 m/s     Systemic VTI:  0.14 m MV Vmean:      58.4 cm/s    Systemic Diam: 2.00 cm MV Decel Time: 126 msec MV E  velocity: 109.75 cm/s  Lorine Bears MD Electronically signed by Lorine Bears MD Signature Date/Time: 12/21/2022/2:10:48 PM    Final    MONITORS  LONG TERM MONITOR (3-14 DAYS) 04/04/2021  Narrative Patch Wear Time:  14 days and 0 hours (2022-10-12T12:34:30-0400 to 2022-10-26T12:34:30-0400)  Atrial Fibrillation occurred continuously (100% burden), ranging from 52-217 bpm (avg of 99 bpm). Isolated VEs were rare (<1.0%, 12790), VE Couplets were rare (<1.0%, 113), and VE Triplets were rare (<1.0%, 10). Previously notified: MD notification            EKG:  EKG is ordered today.  The EKG ordered today demonstrates ***  Recent Labs: 09/22/2022: ALT 26 12/20/2022: B Natriuretic Peptide 283.9 12/23/2022: Hemoglobin 12.9; Platelets 221 12/24/2022: Magnesium 2.1 12/27/2022: BUN 16; Creatinine, Ser 1.03; Potassium 4.2; Sodium 134  Recent Lipid Panel    Component Value Date/Time   CHOL 193 12/22/2022 1136   CHOL 151 11/04/2017 0900   CHOL 234 (H) 12/06/2011 0132   TRIG 76 12/22/2022 1136   TRIG 381 (H) 12/06/2011 0132   HDL 64 12/22/2022 1136   HDL 42 11/04/2017 0900   HDL 34 (L) 12/06/2011 0132   CHOLHDL 3.0 12/22/2022 1136   VLDL 15 12/22/2022 1136   VLDL 76 (H) 12/06/2011 0132   LDLCALC 114 (H)  12/22/2022 1136   LDLCALC 86 11/04/2017 0900   LDLCALC 124 (H) 12/06/2011 0132    PHYSICAL EXAM:    VS:  There were no vitals taken for this visit.  BMI: There is no height or weight on file to calculate BMI.  Physical Exam  Wt Readings from Last 3 Encounters:  12/23/22 152 lb 1.9 oz (69 kg)  09/20/22 155 lb (70.3 kg)  08/07/22 163 lb (73.9 kg)     ASSESSMENT & PLAN:   ***   {Are you ordering a CV Procedure (e.g. stress test, cath, DCCV, TEE, etc)?   Press F2        :[id number]}     Disposition: F/u with Dr. Kirke Corin or an APP in ***.   Medication Adjustments/Labs and Tests Ordered: Current medicines are reviewed at length with the patient today.  Concerns regarding medicines are outlined above. Medication changes, Labs and Tests ordered today are summarized above and listed in the Patient Instructions accessible in Encounters.   Signed, Eula Listen, PA-C 01/10/2023 10:43 AM     [facility] [place] [address] [place], [place] [address] [phone]

## 2023-01-11 ENCOUNTER — Ambulatory Visit: Payer: 59 | Attending: Physician Assistant | Admitting: Physician Assistant

## 2023-01-12 ENCOUNTER — Encounter: Payer: Self-pay | Admitting: Physician Assistant

## 2023-01-12 ENCOUNTER — Encounter: Payer: 59 | Admitting: Cardiology

## 2023-01-22 ENCOUNTER — Encounter: Payer: 59 | Admitting: Cardiology

## 2023-02-04 ENCOUNTER — Other Ambulatory Visit: Payer: Self-pay | Admitting: Cardiovascular Disease

## 2023-02-04 NOTE — Telephone Encounter (Signed)
Please contact pt for future appointment. Pt overdue for follow up.

## 2023-02-05 NOTE — Telephone Encounter (Signed)
last office visit on 08/26/21.  Hospital admission in 11/2022 Darren Rogers note: -Avoid diltiazem use with cardiomyopathy.  NS hospital f/u on 01/11/23 with Darren Rogers.  DC instructions to STOP taking diltiazem 180 MG 24 hr capsule Commonly known as: CARDIZEM CD

## 2023-03-21 ENCOUNTER — Inpatient Hospital Stay
Admission: EM | Admit: 2023-03-21 | Discharge: 2023-03-26 | DRG: 308 | Disposition: A | Discharge status: Home Health | Payer: 59 | Attending: Internal Medicine | Admitting: Internal Medicine

## 2023-03-21 ENCOUNTER — Other Ambulatory Visit: Payer: Self-pay

## 2023-03-21 ENCOUNTER — Emergency Department: Payer: 59

## 2023-03-21 DIAGNOSIS — I10 Essential (primary) hypertension: Secondary | ICD-10-CM | POA: Diagnosis present

## 2023-03-21 DIAGNOSIS — I5A Non-ischemic myocardial injury (non-traumatic): Secondary | ICD-10-CM | POA: Diagnosis present

## 2023-03-21 DIAGNOSIS — R7989 Other specified abnormal findings of blood chemistry: Secondary | ICD-10-CM | POA: Diagnosis present

## 2023-03-21 DIAGNOSIS — F1010 Alcohol abuse, uncomplicated: Secondary | ICD-10-CM | POA: Diagnosis present

## 2023-03-21 DIAGNOSIS — Z91148 Patient's other noncompliance with medication regimen for other reason: Secondary | ICD-10-CM

## 2023-03-21 DIAGNOSIS — F10129 Alcohol abuse with intoxication, unspecified: Secondary | ICD-10-CM | POA: Diagnosis present

## 2023-03-21 DIAGNOSIS — E876 Hypokalemia: Secondary | ICD-10-CM | POA: Diagnosis present

## 2023-03-21 DIAGNOSIS — E785 Hyperlipidemia, unspecified: Secondary | ICD-10-CM | POA: Diagnosis present

## 2023-03-21 DIAGNOSIS — I2489 Other forms of acute ischemic heart disease: Secondary | ICD-10-CM | POA: Diagnosis present

## 2023-03-21 DIAGNOSIS — F1410 Cocaine abuse, uncomplicated: Secondary | ICD-10-CM | POA: Diagnosis present

## 2023-03-21 DIAGNOSIS — I110 Hypertensive heart disease with heart failure: Secondary | ICD-10-CM | POA: Diagnosis present

## 2023-03-21 DIAGNOSIS — I5043 Acute on chronic combined systolic (congestive) and diastolic (congestive) heart failure: Secondary | ICD-10-CM | POA: Diagnosis present

## 2023-03-21 DIAGNOSIS — Z720 Tobacco use: Secondary | ICD-10-CM | POA: Diagnosis present

## 2023-03-21 DIAGNOSIS — Z7901 Long term (current) use of anticoagulants: Secondary | ICD-10-CM

## 2023-03-21 DIAGNOSIS — F17210 Nicotine dependence, cigarettes, uncomplicated: Secondary | ICD-10-CM | POA: Diagnosis present

## 2023-03-21 DIAGNOSIS — I4821 Permanent atrial fibrillation: Principal | ICD-10-CM | POA: Diagnosis present

## 2023-03-21 DIAGNOSIS — I5022 Chronic systolic (congestive) heart failure: Secondary | ICD-10-CM | POA: Diagnosis present

## 2023-03-21 DIAGNOSIS — I4891 Unspecified atrial fibrillation: Principal | ICD-10-CM | POA: Diagnosis present

## 2023-03-21 DIAGNOSIS — Z91199 Patient's noncompliance with other medical treatment and regimen due to unspecified reason: Secondary | ICD-10-CM

## 2023-03-21 DIAGNOSIS — F1910 Other psychoactive substance abuse, uncomplicated: Secondary | ICD-10-CM | POA: Diagnosis present

## 2023-03-21 DIAGNOSIS — Z79899 Other long term (current) drug therapy: Secondary | ICD-10-CM

## 2023-03-21 DIAGNOSIS — Z8249 Family history of ischemic heart disease and other diseases of the circulatory system: Secondary | ICD-10-CM

## 2023-03-21 DIAGNOSIS — I745 Embolism and thrombosis of iliac artery: Secondary | ICD-10-CM | POA: Diagnosis present

## 2023-03-21 DIAGNOSIS — Z833 Family history of diabetes mellitus: Secondary | ICD-10-CM

## 2023-03-21 DIAGNOSIS — I700 Atherosclerosis of aorta: Secondary | ICD-10-CM | POA: Diagnosis present

## 2023-03-21 DIAGNOSIS — I428 Other cardiomyopathies: Secondary | ICD-10-CM | POA: Diagnosis present

## 2023-03-21 DIAGNOSIS — I2510 Atherosclerotic heart disease of native coronary artery without angina pectoris: Secondary | ICD-10-CM | POA: Diagnosis present

## 2023-03-21 DIAGNOSIS — Z7982 Long term (current) use of aspirin: Secondary | ICD-10-CM

## 2023-03-21 DIAGNOSIS — N401 Enlarged prostate with lower urinary tract symptoms: Secondary | ICD-10-CM | POA: Diagnosis present

## 2023-03-21 DIAGNOSIS — W1830XA Fall on same level, unspecified, initial encounter: Secondary | ICD-10-CM | POA: Diagnosis present

## 2023-03-21 DIAGNOSIS — Y92009 Unspecified place in unspecified non-institutional (private) residence as the place of occurrence of the external cause: Secondary | ICD-10-CM

## 2023-03-21 DIAGNOSIS — E871 Hypo-osmolality and hyponatremia: Secondary | ICD-10-CM | POA: Diagnosis present

## 2023-03-21 DIAGNOSIS — F32A Depression, unspecified: Secondary | ICD-10-CM | POA: Diagnosis present

## 2023-03-21 DIAGNOSIS — I739 Peripheral vascular disease, unspecified: Secondary | ICD-10-CM | POA: Diagnosis present

## 2023-03-21 DIAGNOSIS — K760 Fatty (change of) liver, not elsewhere classified: Secondary | ICD-10-CM | POA: Diagnosis present

## 2023-03-21 DIAGNOSIS — W19XXXA Unspecified fall, initial encounter: Secondary | ICD-10-CM

## 2023-03-21 DIAGNOSIS — Z1152 Encounter for screening for COVID-19: Secondary | ICD-10-CM

## 2023-03-21 DIAGNOSIS — I255 Ischemic cardiomyopathy: Secondary | ICD-10-CM | POA: Diagnosis present

## 2023-03-21 DIAGNOSIS — I493 Ventricular premature depolarization: Secondary | ICD-10-CM | POA: Diagnosis present

## 2023-03-21 HISTORY — DX: Chronic systolic (congestive) heart failure: I50.22

## 2023-03-21 LAB — CBC
HCT: 40.3 % (ref 39.0–52.0)
Hemoglobin: 13.8 g/dL (ref 13.0–17.0)
MCH: 32.3 pg (ref 26.0–34.0)
MCHC: 34.2 g/dL (ref 30.0–36.0)
MCV: 94.4 fL (ref 80.0–100.0)
Platelets: 172 10*3/uL (ref 150–400)
RBC: 4.27 MIL/uL (ref 4.22–5.81)
RDW: 15.9 % — ABNORMAL HIGH (ref 11.5–15.5)
WBC: 4.8 10*3/uL (ref 4.0–10.5)
nRBC: 0 % (ref 0.0–0.2)

## 2023-03-21 LAB — BASIC METABOLIC PANEL
Anion gap: 15 (ref 5–15)
BUN: 11 mg/dL (ref 8–23)
CO2: 18 mmol/L — ABNORMAL LOW (ref 22–32)
Calcium: 8.9 mg/dL (ref 8.9–10.3)
Chloride: 94 mmol/L — ABNORMAL LOW (ref 98–111)
Creatinine, Ser: 1.17 mg/dL (ref 0.61–1.24)
GFR, Estimated: >60 mL/min (ref >60)
Glucose, Bld: 88 mg/dL (ref 70–99)
Potassium: 3.7 mmol/L (ref 3.5–5.1)
Sodium: 127 mmol/L — ABNORMAL LOW (ref 135–145)

## 2023-03-21 LAB — LIPASE, BLOOD: Lipase: 26 U/L (ref 11–51)

## 2023-03-21 LAB — URINALYSIS, ROUTINE W REFLEX MICROSCOPIC
Bilirubin Urine: NEGATIVE
Glucose, UA: NEGATIVE mg/dL
Ketones, ur: 5 mg/dL — AB
Nitrite: NEGATIVE
Protein, ur: 30 mg/dL — AB
Specific Gravity, Urine: 1.02 (ref 1.005–1.030)
pH: 5 (ref 5.0–8.0)

## 2023-03-21 LAB — ETHANOL: Alcohol, Ethyl (B): 237 mg/dL — ABNORMAL HIGH (ref <10)

## 2023-03-21 LAB — TROPONIN I (HIGH SENSITIVITY)
Troponin I (High Sensitivity): 24 ng/L — ABNORMAL HIGH (ref <18)
Troponin I (High Sensitivity): 21 ng/L — ABNORMAL HIGH (ref <18)

## 2023-03-21 MED ORDER — LORAZEPAM 2 MG PO TABS
0.0000 mg | ORAL_TABLET | Freq: Two times a day (BID) | ORAL | Status: AC
  Administered 2023-03-24: 2 mg via ORAL
  Administered 2023-03-24: 1 mg via ORAL
  Filled 2023-03-21: qty 1
  Filled 2023-03-21: qty 2
  Filled 2023-03-21: qty 1

## 2023-03-21 MED ORDER — SODIUM CHLORIDE 0.9 % IV BOLUS: 1000.0000 mL | Freq: Once | INTRAVENOUS | Status: DC

## 2023-03-21 MED ORDER — METOPROLOL TARTRATE 5 MG/5ML IV SOLN: 5.0000 mg | Freq: Once | INTRAVENOUS | Status: DC

## 2023-03-21 MED ORDER — METOPROLOL TARTRATE 5 MG/5ML IV SOLN
5.0000 mg | Freq: Once | INTRAVENOUS | Status: AC
Start: 2023-03-21 — End: 2023-03-21
  Administered 2023-03-21: 5 mg via INTRAVENOUS
  Filled 2023-03-21: qty 5

## 2023-03-21 MED ORDER — THIAMINE HCL 100 MG/ML IJ SOLN
100.0000 mg | Freq: Every day | INTRAMUSCULAR | Status: DC
  Filled 2023-03-21: qty 2

## 2023-03-21 MED ORDER — ADULT MULTIVITAMIN W/MINERALS CH
1.0000 | ORAL_TABLET | Freq: Every day | ORAL | Status: DC
  Administered 2023-03-21 – 2023-03-26 (×6): 1 via ORAL
  Filled 2023-03-21 (×6): qty 1

## 2023-03-21 MED ORDER — LORAZEPAM 2 MG PO TABS
0.0000 mg | ORAL_TABLET | Freq: Four times a day (QID) | ORAL | Status: AC
  Administered 2023-03-22 – 2023-03-23 (×6): 2 mg via ORAL
  Filled 2023-03-21: qty 1
  Filled 2023-03-21: qty 2
  Filled 2023-03-21 (×3): qty 1

## 2023-03-21 MED ORDER — DILTIAZEM HCL 25 MG/5ML IV SOLN
10.0000 mg | Freq: Once | INTRAVENOUS | Status: AC
Start: 2023-03-21 — End: 2023-03-21
  Administered 2023-03-21: 10 mg via INTRAVENOUS
  Filled 2023-03-21: qty 5

## 2023-03-21 MED ORDER — SODIUM CHLORIDE 0.9 % IV BOLUS
500.0000 mL | Freq: Once | INTRAVENOUS | Status: AC
  Administered 2023-03-21: 500 mL via INTRAVENOUS

## 2023-03-21 MED ORDER — FOLIC ACID 1 MG PO TABS
1.0000 mg | ORAL_TABLET | Freq: Every day | ORAL | Status: DC
  Administered 2023-03-21 – 2023-03-26 (×6): 1 mg via ORAL
  Filled 2023-03-21 (×6): qty 1

## 2023-03-21 MED ORDER — IOHEXOL 300 MG/ML  SOLN
100.0000 mL | Freq: Once | INTRAMUSCULAR | Status: AC | PRN
  Administered 2023-03-21: 100 mL via INTRAVENOUS

## 2023-03-21 MED ORDER — METOPROLOL TARTRATE 5 MG/5ML IV SOLN
5.0000 mg | Freq: Once | INTRAVENOUS | Status: AC
  Administered 2023-03-21: 5 mg via INTRAVENOUS
  Filled 2023-03-21: qty 5

## 2023-03-21 MED ORDER — THIAMINE MONONITRATE 100 MG PO TABS
100.0000 mg | ORAL_TABLET | Freq: Every day | ORAL | Status: DC
  Administered 2023-03-21 – 2023-03-26 (×6): 100 mg via ORAL
  Filled 2023-03-21 (×6): qty 1

## 2023-03-21 MED ORDER — LORAZEPAM 1 MG PO TABS
1.0000 mg | ORAL_TABLET | Freq: Once | ORAL | Status: AC
Start: 2023-03-21 — End: 2023-03-21
  Administered 2023-03-21: 1 mg via ORAL
  Filled 2023-03-21: qty 1

## 2023-03-21 NOTE — ED Triage Notes (Addendum)
Pt in via [facility] from home for a fall. Pt has been drinking tonight. No LOC, pt is on blood thinners but has not been compliant with his medications for a while. Pt c/o dizziness, CP before EMS arrived. Pt has had decreased appetite for the past few days. Hx of Afib. Pt also states he would like help with his alcoholism. Pt in Afib RVR on Triage EKG.  Vitals Per EMS 108/78 98% on RA HR 169 (pt has hx of afib and is not compliant with meds) CBG-89

## 2023-03-21 NOTE — ED Notes (Signed)
Patient transported to CT 

## 2023-03-21 NOTE — ED Provider Notes (Signed)
[facility] Provider Note    Event Date/Time   First MD Initiated Contact with Patient 03/21/23 1829     (approximate)   History   Fall   HPI  Darren Rogers is a 67 year old male with history of polysubstance use, alcoholic anterior Titus, A-fib, HTN, CAD, CHF presenting to the emergency department for evaluation after a fall.  Patient reports that yesterday he had multiple episodes of vomiting was unable to keep down anything including alcohol.  Somewhat improved today, but was drinking and then had a fall.  Unsure if he hit his head.  In triage, patient was noted to be in A-fib with RVR, reports he stopped taking his medicines about a month ago.  I reviewed his discharge from 12/28/2022.  At that time patient presented with chest pain, NSTEMI.  He was placed on metoprolol and Eliquis as well as digoxin.  Noted to have cardiomyopathy with heart failure with reduced ejection fraction was diuresed.       Physical Exam   Triage Vital Signs: ED Triage Vitals  Encounter Vitals Group     BP 03/21/23 1746 103/86     Systolic BP Percentile --      Diastolic BP Percentile --      Pulse Rate 03/21/23 1746 68     Resp 03/21/23 1746 18     Temp 03/21/23 1746 97.6 F (36.4 C)     Temp src --      SpO2 03/21/23 1746 96 %     Weight 03/21/23 1739 150 lb (68 kg)     Height 03/21/23 1739 5\' 6"  (1.676 m)     Head Circumference --      Peak Flow --      Pain Score 03/21/23 1739 4     Pain Loc --      Pain Education --      Exclude from Growth Chart --     Most recent vital signs: Vitals:   03/21/23 2339 03/21/23 2350  BP:    Pulse:  (!) 116  Resp:    Temp: 98 F (36.7 C)   SpO2:       General: Awake, interactive  CV:  Tachycardic with irregularly irregular rhythm, normal peripheral perfusion Resp:  Lungs clear, unlabored respirations.  Abd:  Soft, nondistended, nontender to palpation Neuro:  Symmetric facial movement, slightly speech consistent with  acute alcohol intoxication   ED Results / Procedures / Treatments   Labs (all labs ordered are listed, but only abnormal results are displayed) Labs Reviewed  BASIC METABOLIC PANEL - Abnormal; Notable for the following components:      Result Value   Sodium 127 (*)    Chloride 94 (*)    CO2 18 (*)    All other components within normal limits  CBC - Abnormal; Notable for the following components:   RDW 15.9 (*)    All other components within normal limits  URINALYSIS, ROUTINE W REFLEX MICROSCOPIC - Abnormal; Notable for the following components:   Color, Urine AMBER (*)    APPearance CLEAR (*)    Hgb urine dipstick SMALL (*)    Ketones, ur 5 (*)    Protein, ur 30 (*)    Leukocytes,Ua TRACE (*)    Bacteria, UA RARE (*)    All other components within normal limits  ETHANOL - Abnormal; Notable for the following components:   Alcohol, Ethyl (B) 237 (*)    All other components within normal limits  TROPONIN I (HIGH SENSITIVITY) - Abnormal; Notable for the following components:   Troponin I (High Sensitivity) 24 (*)    All other components within normal limits  TROPONIN I (HIGH SENSITIVITY) - Abnormal; Notable for the following components:   Troponin I (High Sensitivity) 21 (*)    All other components within normal limits  LIPASE, BLOOD  BRAIN NATRIURETIC PEPTIDE  CBG MONITORING, ED     EKG EKG independently reviewed interpreted by myself (ER attending) demonstrates:  EKG demonstrates A-fib at a rate of 143, QRS 88, QTc 496, nonspecific ST changes  RADIOLOGY Imaging independently reviewed and interpreted by myself demonstrates:  CT head without acute bleed CT C-spine without acute fracture CT abdomen pelvis without obstruction, radiology does not note other acute findings  PROCEDURES:  Critical Care performed: Yes, see critical care procedure note(s)  CRITICAL CARE Performed by: Trinna Post   Total critical care time: 35 minutes  Critical care time was exclusive of  separately billable procedures and treating other patients.  Critical care was necessary to treat or prevent imminent or life-threatening deterioration.  Critical care was time spent personally by me on the following activities: development of treatment plan with patient and/or surrogate as well as nursing, discussions with consultants, evaluation of patient's response to treatment, examination of patient, obtaining history from patient or surrogate, ordering and performing treatments and interventions, ordering and review of laboratory studies, ordering and review of radiographic studies, pulse oximetry and re-evaluation of patient's condition.   Procedures   MEDICATIONS ORDERED IN ED: Medications  thiamine (VITAMIN B1) tablet 100 mg (100 mg Oral Given 03/21/23 2007)    Or  thiamine (VITAMIN B1) injection 100 mg ( Intravenous See Alternative 03/21/23 2007)  folic acid (FOLVITE) tablet 1 mg (1 mg Oral Given 03/21/23 2007)  multivitamin with minerals tablet 1 tablet (1 tablet Oral Given 03/21/23 2007)  LORazepam (ATIVAN) tablet 0-4 mg (has no administration in time range)    Followed by  LORazepam (ATIVAN) tablet 0-4 mg (has no administration in time range)  sodium chloride 0.9 % bolus 500 mL (0 mLs Intravenous Stopped 03/21/23 2131)  metoprolol tartrate (LOPRESSOR) injection 5 mg (5 mg Intravenous Given 03/21/23 2007)  iohexol (OMNIPAQUE) 300 MG/ML solution 100 mL (100 mLs Intravenous Contrast Given 03/21/23 2014)  LORazepam (ATIVAN) tablet 1 mg (1 mg Oral Given 03/21/23 2055)  metoprolol tartrate (LOPRESSOR) injection 5 mg (5 mg Intravenous Given 03/21/23 2055)  diltiazem (CARDIZEM) injection 10 mg (10 mg Intravenous Given 03/21/23 2253)     IMPRESSION / MDM / ASSESSMENT AND PLAN / ED COURSE  I reviewed the triage vital signs and the nursing notes.  Differential diagnosis includes, but is not limited to, fall likely precipitated by alcohol use, consideration for intracranial bleed,  skull fracture, spine fracture, acute intra-abdominal process leading to nausea and vomiting, A-fib precipitated by electrolyte abnormality, infection  Patient's presentation is most consistent with acute presentation with potential threat to life or bodily function.  67 year old male presenting to the emergency department for evaluation after a fall, found to be in A-fib with RVR.  Labs with hyponatremia with sodium of 127.  Mild troponin elevation.  Urine overall not suggestive of infection.  CT scans reassuring.  For A-fib with RVR, patient was given fluids, Ativan for possible component of withdrawal, IV metoprolol given history of cardiomyopathy avoiding calcium channel blockers.  Given 2 doses without significant change in heart rate.  Given refractory A-fib, will discuss with cardiology.  Clinical Course as of 03/22/23  [id number]  Sun Mar 21, 2023  2230 Case reviewed with Dr. Azucena Cecil with cardiology.  Recommended one-time diltiazem bolus of 10 mg.  If this does not improve heart rate, did feel that 0.25 loading dose of digoxin [NR]  2339 After diltiazem, patient did have improvement in his heart rates from 100s to 120.  BP stable with systolic of 120.  Will reach out to hospitalist team to discuss admission. [NR]    Clinical Course User Index [NR] Trinna Post, MD    Reviewed with Dr. Clyde Lundborg with hospitalist team.  He will evaluate patient for anticipated admission.  FINAL CLINICAL IMPRESSION(S) / ED DIAGNOSES   Final diagnoses:  Atrial fibrillation with rapid ventricular response (HCC)  Fall, initial encounter  Hyponatremia     Rx / DC Orders   ED Discharge Orders     None        Note:  This document was prepared using Dragon voice recognition software and may include unintentional dictation errors.   Trinna Post, MD 03/22/23 [phone]

## 2023-03-21 NOTE — ED Notes (Signed)
Pt eating potato chips upon myself entering the room to complete assessment. Pt readily admits to drinking alcohol today and says he hasn't eaten in a couple of days.

## 2023-03-22 ENCOUNTER — Other Ambulatory Visit (HOSPITAL_COMMUNITY): Payer: Self-pay

## 2023-03-22 ENCOUNTER — Encounter: Payer: Self-pay | Admitting: Internal Medicine

## 2023-03-22 DIAGNOSIS — I745 Embolism and thrombosis of iliac artery: Secondary | ICD-10-CM | POA: Diagnosis present

## 2023-03-22 DIAGNOSIS — Z91148 Patient's other noncompliance with medication regimen for other reason: Secondary | ICD-10-CM | POA: Diagnosis not present

## 2023-03-22 DIAGNOSIS — I428 Other cardiomyopathies: Secondary | ICD-10-CM | POA: Diagnosis present

## 2023-03-22 DIAGNOSIS — I4891 Unspecified atrial fibrillation: Secondary | ICD-10-CM

## 2023-03-22 DIAGNOSIS — E785 Hyperlipidemia, unspecified: Secondary | ICD-10-CM | POA: Diagnosis present

## 2023-03-22 DIAGNOSIS — I5022 Chronic systolic (congestive) heart failure: Secondary | ICD-10-CM

## 2023-03-22 DIAGNOSIS — Z1152 Encounter for screening for COVID-19: Secondary | ICD-10-CM | POA: Diagnosis not present

## 2023-03-22 DIAGNOSIS — F1910 Other psychoactive substance abuse, uncomplicated: Secondary | ICD-10-CM | POA: Diagnosis not present

## 2023-03-22 DIAGNOSIS — E871 Hypo-osmolality and hyponatremia: Secondary | ICD-10-CM | POA: Diagnosis present

## 2023-03-22 DIAGNOSIS — F1010 Alcohol abuse, uncomplicated: Secondary | ICD-10-CM | POA: Diagnosis not present

## 2023-03-22 DIAGNOSIS — I5023 Acute on chronic systolic (congestive) heart failure: Secondary | ICD-10-CM | POA: Diagnosis not present

## 2023-03-22 DIAGNOSIS — Z8249 Family history of ischemic heart disease and other diseases of the circulatory system: Secondary | ICD-10-CM | POA: Diagnosis not present

## 2023-03-22 DIAGNOSIS — Z79899 Other long term (current) drug therapy: Secondary | ICD-10-CM | POA: Diagnosis not present

## 2023-03-22 DIAGNOSIS — F10129 Alcohol abuse with intoxication, unspecified: Secondary | ICD-10-CM | POA: Diagnosis present

## 2023-03-22 DIAGNOSIS — I2489 Other forms of acute ischemic heart disease: Secondary | ICD-10-CM | POA: Diagnosis present

## 2023-03-22 DIAGNOSIS — Z7901 Long term (current) use of anticoagulants: Secondary | ICD-10-CM | POA: Diagnosis not present

## 2023-03-22 DIAGNOSIS — W19XXXA Unspecified fall, initial encounter: Secondary | ICD-10-CM | POA: Diagnosis not present

## 2023-03-22 DIAGNOSIS — N401 Enlarged prostate with lower urinary tract symptoms: Secondary | ICD-10-CM | POA: Diagnosis present

## 2023-03-22 DIAGNOSIS — I10 Essential (primary) hypertension: Secondary | ICD-10-CM

## 2023-03-22 DIAGNOSIS — I4821 Permanent atrial fibrillation: Secondary | ICD-10-CM | POA: Diagnosis present

## 2023-03-22 DIAGNOSIS — F1410 Cocaine abuse, uncomplicated: Secondary | ICD-10-CM

## 2023-03-22 DIAGNOSIS — I739 Peripheral vascular disease, unspecified: Secondary | ICD-10-CM | POA: Diagnosis not present

## 2023-03-22 DIAGNOSIS — Z720 Tobacco use: Secondary | ICD-10-CM

## 2023-03-22 DIAGNOSIS — F17210 Nicotine dependence, cigarettes, uncomplicated: Secondary | ICD-10-CM | POA: Diagnosis present

## 2023-03-22 DIAGNOSIS — K760 Fatty (change of) liver, not elsewhere classified: Secondary | ICD-10-CM | POA: Diagnosis present

## 2023-03-22 DIAGNOSIS — E876 Hypokalemia: Secondary | ICD-10-CM | POA: Diagnosis present

## 2023-03-22 DIAGNOSIS — I5043 Acute on chronic combined systolic (congestive) and diastolic (congestive) heart failure: Secondary | ICD-10-CM | POA: Diagnosis present

## 2023-03-22 DIAGNOSIS — I5A Non-ischemic myocardial injury (non-traumatic): Secondary | ICD-10-CM

## 2023-03-22 DIAGNOSIS — R7989 Other specified abnormal findings of blood chemistry: Secondary | ICD-10-CM | POA: Diagnosis present

## 2023-03-22 DIAGNOSIS — W1830XA Fall on same level, unspecified, initial encounter: Secondary | ICD-10-CM | POA: Diagnosis present

## 2023-03-22 DIAGNOSIS — Y92009 Unspecified place in unspecified non-institutional (private) residence as the place of occurrence of the external cause: Secondary | ICD-10-CM | POA: Diagnosis not present

## 2023-03-22 DIAGNOSIS — F32A Depression, unspecified: Secondary | ICD-10-CM | POA: Diagnosis present

## 2023-03-22 DIAGNOSIS — I110 Hypertensive heart disease with heart failure: Secondary | ICD-10-CM | POA: Diagnosis present

## 2023-03-22 DIAGNOSIS — I255 Ischemic cardiomyopathy: Secondary | ICD-10-CM | POA: Diagnosis present

## 2023-03-22 DIAGNOSIS — Z833 Family history of diabetes mellitus: Secondary | ICD-10-CM | POA: Diagnosis not present

## 2023-03-22 DIAGNOSIS — I2510 Atherosclerotic heart disease of native coronary artery without angina pectoris: Secondary | ICD-10-CM | POA: Diagnosis present

## 2023-03-22 LAB — LIPID PANEL
Cholesterol: 194 mg/dL (ref 0–200)
HDL: 72 mg/dL (ref >40)
LDL Cholesterol: 106 mg/dL — ABNORMAL HIGH (ref 0–99)
Total CHOL/HDL Ratio: 2.7 {ratio}
Triglycerides: 80 mg/dL (ref <150)
VLDL: 16 mg/dL (ref 0–40)

## 2023-03-22 LAB — BASIC METABOLIC PANEL
Anion gap: 11 (ref 5–15)
Anion gap: 11 (ref 5–15)
Anion gap: 11 (ref 5–15)
BUN: 8 mg/dL (ref 8–23)
BUN: 8 mg/dL (ref 8–23)
BUN: 10 mg/dL (ref 8–23)
CO2: 19 mmol/L — ABNORMAL LOW (ref 22–32)
CO2: 22 mmol/L (ref 22–32)
CO2: 21 mmol/L — ABNORMAL LOW (ref 22–32)
Calcium: 8.3 mg/dL — ABNORMAL LOW (ref 8.9–10.3)
Calcium: 8.9 mg/dL (ref 8.9–10.3)
Calcium: 8.8 mg/dL — ABNORMAL LOW (ref 8.9–10.3)
Chloride: 98 mmol/L (ref 98–111)
Chloride: 99 mmol/L (ref 98–111)
Chloride: 101 mmol/L (ref 98–111)
Creatinine, Ser: 0.95 mg/dL (ref 0.61–1.24)
Creatinine, Ser: 1.02 mg/dL (ref 0.61–1.24)
Creatinine, Ser: 1.02 mg/dL (ref 0.61–1.24)
GFR, Estimated: >60 mL/min (ref >60)
GFR, Estimated: >60 mL/min (ref >60)
GFR, Estimated: >60 mL/min (ref >60)
Glucose, Bld: 103 mg/dL — ABNORMAL HIGH (ref 70–99)
Glucose, Bld: 118 mg/dL — ABNORMAL HIGH (ref 70–99)
Glucose, Bld: 115 mg/dL — ABNORMAL HIGH (ref 70–99)
Potassium: 3.9 mmol/L (ref 3.5–5.1)
Potassium: 4.8 mmol/L (ref 3.5–5.1)
Potassium: 4 mmol/L (ref 3.5–5.1)
Sodium: 128 mmol/L — ABNORMAL LOW (ref 135–145)
Sodium: 132 mmol/L — ABNORMAL LOW (ref 135–145)
Sodium: 133 mmol/L — ABNORMAL LOW (ref 135–145)

## 2023-03-22 LAB — CBC
HCT: 39.2 % (ref 39.0–52.0)
Hemoglobin: 13.6 g/dL (ref 13.0–17.0)
MCH: 32.5 pg (ref 26.0–34.0)
MCHC: 34.7 g/dL (ref 30.0–36.0)
MCV: 93.8 fL (ref 80.0–100.0)
Platelets: 160 10*3/uL (ref 150–400)
RBC: 4.18 MIL/uL — ABNORMAL LOW (ref 4.22–5.81)
RDW: 15.9 % — ABNORMAL HIGH (ref 11.5–15.5)
WBC: 4.6 10*3/uL (ref 4.0–10.5)
nRBC: 0 % (ref 0.0–0.2)

## 2023-03-22 LAB — URINE DRUG SCREEN, QUALITATIVE (ARMC ONLY)
Amphetamines, Ur Screen: NOT DETECTED
Barbiturates, Ur Screen: NOT DETECTED
Benzodiazepine, Ur Scrn: NOT DETECTED
Cannabinoid 50 Ng, Ur ~~LOC~~: NOT DETECTED
Cocaine Metabolite,Ur ~~LOC~~: NOT DETECTED
MDMA (Ecstasy)Ur Screen: NOT DETECTED
Methadone Scn, Ur: NOT DETECTED
Opiate, Ur Screen: NOT DETECTED
Phencyclidine (PCP) Ur S: NOT DETECTED
Tricyclic, Ur Screen: NOT DETECTED

## 2023-03-22 LAB — HEMOGLOBIN A1C
Hgb A1c MFr Bld: 5.5 % (ref 4.8–5.6)
Mean Plasma Glucose: 111.15 mg/dL

## 2023-03-22 LAB — MAGNESIUM: Magnesium: 1.8 mg/dL (ref 1.7–2.4)

## 2023-03-22 LAB — DIGOXIN LEVEL: Digoxin Level: <0.2 ng/mL — ABNORMAL LOW (ref 0.8–2.0)

## 2023-03-22 LAB — PHOSPHORUS: Phosphorus: 3.9 mg/dL (ref 2.5–4.6)

## 2023-03-22 LAB — BRAIN NATRIURETIC PEPTIDE: B Natriuretic Peptide: 1050.7 pg/mL — ABNORMAL HIGH (ref 0.0–100.0)

## 2023-03-22 MED ORDER — AMIODARONE HCL IN DEXTROSE 360-4.14 MG/200ML-% IV SOLN: 30.0000 mg/h | INTRAVENOUS | Status: DC

## 2023-03-22 MED ORDER — NICOTINE 21 MG/24HR TD PT24
21.0000 mg | MEDICATED_PATCH | Freq: Every day | TRANSDERMAL | Status: DC
  Administered 2023-03-24 – 2023-03-25 (×2): 21 mg via TRANSDERMAL
  Filled 2023-03-22 (×5): qty 1

## 2023-03-22 MED ORDER — AMIODARONE LOAD VIA INFUSION
150.0000 mg | Freq: Once | INTRAVENOUS | Status: DC
  Filled 2023-03-22: qty 83.34

## 2023-03-22 MED ORDER — FUROSEMIDE 20 MG PO TABS
20.0000 mg | ORAL_TABLET | Freq: Every day | ORAL | Status: DC
  Administered 2023-03-23: 20 mg via ORAL
  Filled 2023-03-22 (×2): qty 1

## 2023-03-22 MED ORDER — METOPROLOL SUCCINATE ER 50 MG PO TB24: 100.0000 mg | ORAL_TABLET | Freq: Two times a day (BID) | ORAL | Status: DC

## 2023-03-22 MED ORDER — SODIUM CHLORIDE 1 G PO TABS
1.0000 g | ORAL_TABLET | Freq: Two times a day (BID) | ORAL | Status: DC
  Administered 2023-03-22 – 2023-03-24 (×6): 1 g via ORAL
  Filled 2023-03-22 (×6): qty 1

## 2023-03-22 MED ORDER — NITROGLYCERIN 0.4 MG SL SUBL: 0.4000 mg | SUBLINGUAL_TABLET | SUBLINGUAL | Status: DC | PRN

## 2023-03-22 MED ORDER — ACETAMINOPHEN 325 MG PO TABS
650.0000 mg | ORAL_TABLET | Freq: Four times a day (QID) | ORAL | Status: DC | PRN
  Administered 2023-03-24 – 2023-03-25 (×2): 650 mg via ORAL
  Filled 2023-03-22 (×2): qty 2

## 2023-03-22 MED ORDER — DIGOXIN 0.25 MG/ML IJ SOLN
0.2500 mg | Freq: Once | INTRAMUSCULAR | Status: AC
  Administered 2023-03-22: 0.25 mg via INTRAVENOUS
  Filled 2023-03-22: qty 2

## 2023-03-22 MED ORDER — ONDANSETRON HCL 4 MG/2ML IJ SOLN: 4.0000 mg | Freq: Three times a day (TID) | INTRAMUSCULAR | Status: DC | PRN

## 2023-03-22 MED ORDER — HYDRALAZINE HCL 20 MG/ML IJ SOLN
5.0000 mg | INTRAMUSCULAR | Status: DC | PRN
  Filled 2023-03-22: qty 1

## 2023-03-22 MED ORDER — AMIODARONE HCL IN DEXTROSE 360-4.14 MG/200ML-% IV SOLN
30.0000 mg/h | INTRAVENOUS | Status: DC
  Administered 2023-03-22 – 2023-03-23 (×4): 30 mg/h via INTRAVENOUS
  Filled 2023-03-22: qty 200

## 2023-03-22 MED ORDER — ATORVASTATIN CALCIUM 20 MG PO TABS
40.0000 mg | ORAL_TABLET | Freq: Every day | ORAL | Status: DC
  Administered 2023-03-22 – 2023-03-26 (×5): 40 mg via ORAL
  Filled 2023-03-22 (×5): qty 2

## 2023-03-22 MED ORDER — AMIODARONE HCL IN DEXTROSE 360-4.14 MG/200ML-% IV SOLN: 60.0000 mg/h | INTRAVENOUS | Status: DC

## 2023-03-22 MED ORDER — DIGOXIN 250 MCG PO TABS
0.2500 mg | ORAL_TABLET | Freq: Every day | ORAL | Status: DC
  Administered 2023-03-22 – 2023-03-23 (×2): 0.25 mg via ORAL
  Filled 2023-03-22 (×3): qty 1

## 2023-03-22 MED ORDER — PANTOPRAZOLE SODIUM 40 MG PO TBEC
40.0000 mg | DELAYED_RELEASE_TABLET | Freq: Every day | ORAL | Status: DC
Start: 2023-03-22 — End: 2023-03-26
  Administered 2023-03-22 – 2023-03-26 (×5): 40 mg via ORAL
  Filled 2023-03-22 (×5): qty 1

## 2023-03-22 MED ORDER — METOPROLOL SUCCINATE ER 100 MG PO TB24
100.0000 mg | ORAL_TABLET | Freq: Two times a day (BID) | ORAL | Status: DC
  Administered 2023-03-22 – 2023-03-24 (×5): 100 mg via ORAL
  Filled 2023-03-22 (×2): qty 1
  Filled 2023-03-22: qty 2
  Filled 2023-03-22 (×2): qty 1

## 2023-03-22 MED ORDER — AMIODARONE HCL IN DEXTROSE 360-4.14 MG/200ML-% IV SOLN
60.0000 mg/h | INTRAVENOUS | Status: AC
  Administered 2023-03-22: 60 mg/h via INTRAVENOUS
  Filled 2023-03-22 (×2): qty 200

## 2023-03-22 MED ORDER — AMIODARONE IV BOLUS ONLY 150 MG/100ML
INTRAVENOUS | Status: AC
  Administered 2023-03-22: 150 mg
  Filled 2023-03-22: qty 100

## 2023-03-22 MED ORDER — APIXABAN 5 MG PO TABS
5.0000 mg | ORAL_TABLET | Freq: Two times a day (BID) | ORAL | Status: DC
  Administered 2023-03-22 – 2023-03-26 (×9): 5 mg via ORAL
  Filled 2023-03-22 (×9): qty 1

## 2023-03-22 NOTE — ED Notes (Signed)
Floor secretary called about no RN assigned to pt. Secretary said they will give [place] a  green "purple man".

## 2023-03-22 NOTE — H&P (Incomplete)
History and Physical    Darren Rogers MVH:[id number] DOB: 1955-10-13 DOA: 03/21/2023  Referring MD/NP/PA:   PCP: Pcp, No   Patient coming from:  The patient is coming from home.     Chief Complaint: fall and heart racing  HPI: Darren Rogers is a 67 y.o. male with medical history significant of of significant of polysubstance use (cocaine, alcohol, tobacco), recurrent alcoholic pancreatitis, A fib on Eliquis and digoxin, HTN, HLD, depression, CAD, claudication, BPH, PVC, who presents with fall and heart racing.   Pt states that he continues to drink alcohol.  He had several episode of nonbilious nonbloody vomiting today, and developed lightheadedness and felt accidentally.  No loss of consciousness.  Denies any injury.  No headache or neck pain.  Patient reports poor appetite and decreased oral intake.  No abdominal pain or diarrhea.  Patient does not have cough or chest pain.  He reports heart racing, mild shortness of breath.  Denies symptoms of UTI.  Patient states that taking his medicines about a month ago.  In triage, patient was noted to be in A-fib with RVR with heart rate up to 140s.  Patient was given metoprolol 2 mg x 2 doses, and 10 mg of IV Cardizem in ED.  Heart rate is in 110-130.    Data reviewed independently and ED Course: pt was found to have troponin 24 --> 21, WBC 4.8, GFR> 60, sodium 129, temperature normal, soft blood pressure with SBP 90-100, RR 24 --> 18, oxygen saturation 100% on room air.  CT of head and CT of C-spine negative for acute injury.  Patient is placed on PCU for observation.  CT abdomen/pelvis: 1.  No acute  intra-abdominal, intrapelvic traumatic injury. 2. No acute fracture or traumatic malalignment of the lumbar spine.   Other imaging findings of potential clinical significance:   1. Cardiomegaly. 2. Aortic Atherosclerosis (ICD10-I70.0)-extensive and stable with chronic occlusion of the right iliac arterial system as well as chronic severe  narrowing of the left iliac arterial system. Marked diminished abdominal aortic caliber down to 1.1 x 0.9 cm. Recommend outpatient vascular consultation. 3. Hepatic steatosis.     EKG: I have personally reviewed.  A-fib with RVR, heart rate 143, poor R wave progression   Review of Systems:   General: no fevers, chills, no body weight gain, has poor appetite, has fatigue HEENT: no blurry vision, hearing changes or sore throat Respiratory: no dyspnea, coughing, wheezing CV: no chest pain, has palpitations GI: has nausea, vomiting, no abdominal pain, diarrhea, constipation GU: no dysuria, burning on urination, increased urinary frequency, hematuria  Ext: has leg edema Neuro: no unilateral weakness, numbness, or tingling, no vision change or hearing loss. Has fall Skin: no rash, no skin tear. MSK: No muscle spasm, no deformity, no limitation of range of movement in spin Heme: No easy bruising.  Travel history: No recent long distant travel.   Allergy: No Known Allergies  Past Medical History:  Diagnosis Date   Claudication (HCC)    a. 06/2015 ABI: R - 0.73, L - 0.73. 30-49% bilat SFA stenosis. 50-74% R Profunda stenosis; b. 01/2017 ABI: R 0.89, L 0.88, TBI R 0.65, L 1.0.   Erectile dysfunction    Essential hypertension    History of echocardiogram    a. 03/29/2014: EF 55-60%, mild LVH, normal RVSP;  b. 05/2015 Echo: EF 60-65%, triv AI, mild MR; c. 03/2021 Echo: EF 55-60%, no rwma, mild LVH, nl RV fxn, mildly dil LA, mild MR.   Hyperlipidemia  Non-obstructive CAD    a. 01/27/2014 Cath: LM nl, LAD mild diff dz w/o obs, LCX no sig obs - scattered 20-30% mLCx, RCA no obs dz, EF 70%; b. 06/2015 MV; EF 60%, no ischemia; c. 07/2020 MV: No ischemia/scar.   Persistent atrial fibrillation (HCC)    a. Pt says Dx >21yrs ago w/ ? RFCA @ [facility];  b. Recurrent 12/2013;  b. Rx Flecainide and Xarelto-->Intermittent compliance; c. 03/2021 Zio: 100% Afib w/ avg rate of 99 bpm. Rare PVCs.   PVC's  (premature ventricular contractions)    a. 05/2017 Zio: 4000 PVCs in 48 hrs (2%). Brief run of SVT.   Tobacco abuse    a. ongoing - 1 ppd.    Past Surgical History:  Procedure Laterality Date   CARDIAC CATHETERIZATION     CARDIAC CATHETERIZATION     [facility]   LEFT HEART CATH Right 01/27/2014   Procedure: LEFT HEART CATH;  Surgeon: Micheline Chapman, MD;  Location: [facility] CATH LAB;  Service: Cardiovascular;  Laterality: Right;   RIGHT/LEFT HEART CATH AND CORONARY ANGIOGRAPHY N/A 12/23/2022   Procedure: RIGHT/LEFT HEART CATH AND CORONARY ANGIOGRAPHY;  Surgeon: Yvonne Kendall, MD;  Location: [facility] INVASIVE CV LAB;  Service: Cardiovascular;  Laterality: N/A;    Social History:  reports that he has been smoking cigarettes. He has a 15 pack-year smoking history. He has never used smokeless tobacco. He reports current alcohol use. He reports that he does not currently use drugs after having used the following drugs: Cocaine.  Family History:  Family History  Problem Relation Age of Onset   Coronary artery disease Father 32   Diabetes Mother    Diabetes Sister    Diabetes Brother    Diabetes Maternal Grandmother    Diabetes Sister    Diabetes Sister      Prior to Admission medications   Medication Sig Start Date End Date Taking? Authorizing Provider  apixaban (ELIQUIS) 5 MG TABS tablet Take 1 tablet (5 mg total) by mouth 2 (two) times daily. 12/28/22   Sunnie Nielsen, DO  aspirin EC 81 MG tablet Take 1 tablet (81 mg total) by mouth daily. Swallow whole. 12/28/22   Sunnie Nielsen, DO  atorvastatin (LIPITOR) 40 MG tablet Take 1 tablet (40 mg total) by mouth daily. 12/28/22 06/26/23  Sunnie Nielsen, DO  digoxin (LANOXIN) 0.25 MG tablet Take 1 tablet (0.25 mg total) by mouth daily. 12/29/22   Sunnie Nielsen, DO  folic acid (FOLVITE) 1 MG tablet Take 1 tablet (1 mg total) by mouth daily. 12/29/22   Sunnie Nielsen, DO  furosemide (LASIX) 20 MG tablet Take 1 tablet (20 mg total) by mouth  daily. Increase to 1 tablet (20 mg total) by mouth TWICE daily (total daily dose 40 mg) as needed for up to 3 days for increased leg swelling, shortness of breath, weight gain 5+ lbs over 1-2 days. Seek medical care if these symptoms are not improving with increased dose. 12/29/22   Sunnie Nielsen, DO  metoprolol succinate (TOPROL-XL) 100 MG 24 hr tablet Take 1 tablet (100 mg total) by mouth 2 (two) times daily. Take with or immediately following a meal. 12/28/22   Sunnie Nielsen, DO  Multiple Vitamin (MULTIVITAMIN WITH MINERALS) TABS tablet Take 1 tablet by mouth daily. 12/29/22   Sunnie Nielsen, DO  nitroGLYCERIN (NITROSTAT) 0.4 MG SL tablet Place 1 tablet (0.4 mg total) under the tongue every 5 (five) minutes x 3 doses as needed for chest pain. 12/28/22   Sunnie Nielsen, DO  pantoprazole (PROTONIX) 40 MG tablet Take 1 tablet (40 mg total) by mouth daily. 12/28/22   Sunnie Nielsen, DO  polyethylene glycol (MIRALAX / GLYCOLAX) 17 g packet Take 17 g by mouth daily as needed. Patient not taking: Reported on 12/20/2022 05/24/22   Alford Highland, MD  sacubitril-valsartan (ENTRESTO) 24-26 MG Take 1 tablet by mouth 2 (two) times daily. 12/28/22   Sunnie Nielsen, DO  spironolactone (ALDACTONE) 25 MG tablet Take 0.5 tablets (12.5 mg total) by mouth daily. 12/29/22   Sunnie Nielsen, DO  thiamine (VITAMIN B-1) 100 MG tablet Take 1 tablet (100 mg total) by mouth daily. 12/29/22   Sunnie Nielsen, DO    Physical Exam: Vitals:   03/21/23 2339 03/21/23 2350 03/22/23 0030 03/22/23 0111  BP:   105/81   Pulse:  (!) 116 92 100  Resp:      Temp: 98 F (36.7 C)     TempSrc: Oral     SpO2:      Weight:      Height:       General: Not in acute distress HEENT:       Eyes: PERRL, EOMI, no jaundice       ENT: No discharge from the ears and nose, no pharynx injection, no tonsillar enlargement.        Neck: No JVD, no bruit, no mass felt. Heme: No neck lymph node enlargement. Cardiac:  S1/S2, irregularly irregular rhythm, No murmurs, No gallops or rubs. Respiratory: No rales, wheezing, rhonchi or rubs. GI: Soft, nondistended, nontender, no rebound pain, no organomegaly, BS present. GU: No hematuria Ext: has trace leg edema bilaterally. 1+DP/PT pulse bilaterally. Musculoskeletal: No joint deformities, No joint redness or warmth, no limitation of ROM in spin. Skin: No rashes.  Neuro: Alert, oriented X3, cranial nerves II-XII grossly intact, moves all extremities normally.  Psych: Patient is not psychotic, no suicidal or hemocidal ideation.  Labs on Admission: I have personally reviewed following labs and imaging studies  CBC: Recent Labs  Lab 03/21/23 1742  WBC 4.8  HGB 13.8  HCT 40.3  MCV 94.4  PLT 172   Basic Metabolic Panel: Recent Labs  Lab 03/21/23 1742  NA 127*  K 3.7  CL 94*  CO2 18*  GLUCOSE 88  BUN 11  CREATININE 1.17  CALCIUM 8.9   GFR: Estimated Creatinine Clearance: 55.3 mL/min (by C-G formula based on SCr of 1.17 mg/dL). Liver Function Tests: No results for input(s): "AST", "ALT", "ALKPHOS", "BILITOT", "PROT", "ALBUMIN" in the last 168 hours. Recent Labs  Lab 03/21/23 1742  LIPASE 26   No results for input(s): "AMMONIA" in the last 168 hours. Coagulation Profile: No results for input(s): "INR", "PROTIME" in the last 168 hours. Cardiac Enzymes: No results for input(s): "CKTOTAL", "CKMB", "CKMBINDEX", "TROPONINI" in the last 168 hours. BNP (last 3 results) No results for input(s): "PROBNP" in the last 8760 hours. HbA1C: No results for input(s): "HGBA1C" in the last 72 hours. CBG: No results for input(s): "GLUCAP" in the last 168 hours. Lipid Profile: No results for input(s): "CHOL", "HDL", "LDLCALC", "TRIG", "CHOLHDL", "LDLDIRECT" in the last 72 hours. Thyroid Function Tests: No results for input(s): "TSH", "T4TOTAL", "FREET4", "T3FREE", "THYROIDAB" in the last 72 hours. Anemia Panel: No results for input(s): "VITAMINB12",  "FOLATE", "FERRITIN", "TIBC", "IRON", "RETICCTPCT" in the last 72 hours. Urine analysis:    Component Value Date/Time   COLORURINE AMBER (A) 03/21/2023 1845   APPEARANCEUR CLEAR (A) 03/21/2023 1845   APPEARANCEUR Clear 07/30/2016 1600   LABSPEC 1.020  03/21/2023 1845   LABSPEC 1.019 03/28/2014 1430   PHURINE 5.0 03/21/2023 1845   GLUCOSEU NEGATIVE 03/21/2023 1845   GLUCOSEU Negative 03/28/2014 1430   HGBUR SMALL (A) 03/21/2023 1845   BILIRUBINUR NEGATIVE 03/21/2023 1845   BILIRUBINUR Negative 07/30/2016 1600   BILIRUBINUR Negative 03/28/2014 1430   KETONESUR 5 (A) 03/21/2023 1845   PROTEINUR 30 (A) 03/21/2023 1845   NITRITE NEGATIVE 03/21/2023 1845   LEUKOCYTESUR TRACE (A) 03/21/2023 1845   LEUKOCYTESUR Negative 03/28/2014 1430   Sepsis Labs: @LABRCNTIP (procalcitonin:4,lacticidven:4) )No results found for this or any previous visit (from the past 240 hour(s)).   Radiological Exams on Admission: CT Head Wo Contrast  Result Date: 03/21/2023 CLINICAL DATA:  Recent fall EXAM: CT HEAD WITHOUT CONTRAST CT CERVICAL SPINE WITHOUT CONTRAST TECHNIQUE: Multidetector CT imaging of the head and cervical spine was performed following the standard protocol without intravenous contrast. Multiplanar CT image reconstructions of the cervical spine were also generated. RADIATION DOSE REDUCTION: This exam was performed according to the departmental dose-optimization program which includes automated exposure control, adjustment of the mA and/or kV according to patient size and/or use of iterative reconstruction technique. COMPARISON:  None Available. FINDINGS: CT HEAD FINDINGS Brain: No evidence of acute infarction, hemorrhage, hydrocephalus, extra-axial collection or mass lesion/mass effect. Chronic atrophic and ischemic changes are noted. Mild basal ganglia calcifications seen. Vascular: No hyperdense vessel or unexpected calcification. Skull: Normal. Negative for fracture or focal lesion. Sinuses/Orbits:  No acute finding. Other: None. CT CERVICAL SPINE FINDINGS Alignment: Loss of the normal cervical lordosis is noted. Skull base and vertebrae: 7 cervical segments are well visualized. Vertebral body height is well maintained. No acute fracture or acute facet abnormality is noted. Osteophytic change and facet hypertrophic changes are seen. Soft tissues and spinal canal: Surrounding soft tissue structures are within normal limits. No focal hematoma is seen. Upper chest: Visualized lung apices are within normal limits. Other: None IMPRESSION: CT of the head: No acute intracranial abnormality noted. CT of the cervical spine: Degenerative change without acute abnormality. Electronically Signed   By: Alcide Clever M.D.   On: 03/21/2023 21:27   CT Cervical Spine Wo Contrast  Result Date: 03/21/2023 CLINICAL DATA:  Recent fall EXAM: CT HEAD WITHOUT CONTRAST CT CERVICAL SPINE WITHOUT CONTRAST TECHNIQUE: Multidetector CT imaging of the head and cervical spine was performed following the standard protocol without intravenous contrast. Multiplanar CT image reconstructions of the cervical spine were also generated. RADIATION DOSE REDUCTION: This exam was performed according to the departmental dose-optimization program which includes automated exposure control, adjustment of the mA and/or kV according to patient size and/or use of iterative reconstruction technique. COMPARISON:  None Available. FINDINGS: CT HEAD FINDINGS Brain: No evidence of acute infarction, hemorrhage, hydrocephalus, extra-axial collection or mass lesion/mass effect. Chronic atrophic and ischemic changes are noted. Mild basal ganglia calcifications seen. Vascular: No hyperdense vessel or unexpected calcification. Skull: Normal. Negative for fracture or focal lesion. Sinuses/Orbits: No acute finding. Other: None. CT CERVICAL SPINE FINDINGS Alignment: Loss of the normal cervical lordosis is noted. Skull base and vertebrae: 7 cervical segments are well  visualized. Vertebral body height is well maintained. No acute fracture or acute facet abnormality is noted. Osteophytic change and facet hypertrophic changes are seen. Soft tissues and spinal canal: Surrounding soft tissue structures are within normal limits. No focal hematoma is seen. Upper chest: Visualized lung apices are within normal limits. Other: None IMPRESSION: CT of the head: No acute intracranial abnormality noted. CT of the cervical spine: Degenerative change without  acute abnormality. Electronically Signed   By: Alcide Clever M.D.   On: 03/21/2023 21:27   CT ABDOMEN PELVIS W CONTRAST  Result Date: 03/21/2023 CLINICAL DATA:  Abdominal pain, acute, nonlocalized a fall. Pt has been drinking tonight. No LOC, pt is on blood thinners but has not been compliant with his medications for a while. Pt c/o dizziness, CP before EMS arrived. EXAM: CT ABDOMEN AND PELVIS WITH CONTRAST TECHNIQUE: Multidetector CT imaging of the abdomen and pelvis was performed using the standard protocol following bolus administration of intravenous contrast. RADIATION DOSE REDUCTION: This exam was performed according to the departmental dose-optimization program which includes automated exposure control, adjustment of the mA and/or kV according to patient size and/or use of iterative reconstruction technique. CONTRAST:  OMNIPAQUE IOHEXOL 300 MG/ML  SOLN COMPARISON:  CT abdomen pelvis 09/20/2022 FINDINGS: Lower chest: Enlarged heart.  No acute abnormality. Hepatobiliary: Not enlarged. Diffusely hypodense hepatic parenchyma. No focal lesion. No laceration or subcapsular hematoma. The gallbladder is otherwise unremarkable with no radio-opaque gallstones. No biliary ductal dilatation. Pancreas: Normal pancreatic contour. No main pancreatic duct dilatation. Spleen: Not enlarged. No focal lesion. No laceration, subcapsular hematoma, or vascular injury. Adrenals/Urinary Tract: No nodularity bilaterally. Bilateral kidneys enhance  symmetrically. No hydronephrosis. No contusion, laceration, or subcapsular hematoma. No injury to the vascular structures or collecting systems. No hydroureter. The urinary bladder is decompressed. Stomach/Bowel: No small or large bowel wall thickening or dilatation. The appendix is unremarkable. Vasculature/Lymphatics: Severe atherosclerotic plaque with chronic occlusion of the right iliac arterial system as well as chronic severe narrowing of the left iliac arterial system. Marked diminished aortic caliber down to 1.1 x 0.9 cm. Reconstitution distally of a markedly diminished right external iliac artery and femoral artery noted. Findings appear to be stable. Arterial collaterals noted. No abdominal aorta or iliac aneurysm. No active contrast extravasation or pseudoaneurysm. No abdominal, pelvic, inguinal lymphadenopathy. Reproductive: Prostate is unremarkable. Other: No simple free fluid ascites. No pneumoperitoneum. No hemoperitoneum. No mesenteric hematoma identified. No organized fluid collection. Musculoskeletal: No significant soft tissue hematoma. No acute pelvic fracture. No spinal fracture. Severe degenerative changes of the L4-S1 levels. Associated intervertebral disc space vacuum phenomenon posterior disc osteophyte complex formation. No associated severe osseous neural foraminal or central canal stenosis. Ports and Devices: None. IMPRESSION: 1.  No acute  intra-abdominal, intrapelvic traumatic injury. 2. No acute fracture or traumatic malalignment of the lumbar spine. Other imaging findings of potential clinical significance: 1. Cardiomegaly. 2. Aortic Atherosclerosis (ICD10-I70.0)-extensive and stable with chronic occlusion of the right iliac arterial system as well as chronic severe narrowing of the left iliac arterial system. Marked diminished abdominal aortic caliber down to 1.1 x 0.9 cm. Recommend outpatient vascular consultation. 3. Hepatic steatosis. Electronically Signed   By: Tish Frederickson  M.D.   On: 03/21/2023 21:22      Assessment/Plan Principal Problem:   Atrial fibrillation with RVR (HCC) Active Problems:   Chronic systolic CHF (congestive heart failure) (HCC)   Alcohol abuse   Myocardial injury   Essential hypertension   Hyponatremia   HLD (hyperlipidemia)   PAD (peripheral artery disease) (HCC)   Tobacco abuse   Cocaine abuse (HCC)   Polysubstance abuse (HCC)   Fall at home, initial encounter   Assessment and Plan:  Atrial fibrillation with RVR (HCC): initial HR 140s.  Patient was given metoprolol 2 mg x 2 doses, and 10 mg of IV Cardizem in ED.  Heart rate is in 110-130. EDP dicussed with Dr. Azucena Cecil with cardiology, recommended  0.25 loading dose of digoxin.  Patient has EF of 35-40%, Cardizem drip is not a good choice.  -Placed on PCU for observation -Load 0.25 mg of digoxin IV -Continue home oral 0.25 mg of digoxin daily -Check digoxin level -Continue metoprolol 100 mg twice daily -Restart Eliquis   Chronic systolic CHF (congestive heart failure) (HCC): 2D echo on 12/19/2022 showed EF of 35-40%.  Patient has trace leg edema, no JVD, CHF seem to be compensated. -Hold Entresto, Lasix, spironolactone due to hyponatremia  Polysubstance abuse including cocaine, alcohol, tobacco abuse -Did counseling about importance of quitting substance use -Check UDS -CIWA protocol -Nicotine patch  Myocardial injury: Troponin 24 --> 21, likely demand ischemia. -Lipitor -Check A1c, FP -Will not give aspirin since patient will be restarted on Eliquis  Essential hypertension -Holding Entresto, Lasix, spironolactone due to hyponatremia -IV hydralazine as needed -On metoprolol  Hyponatremia: Sodium 127, due to alcohol abuse.  Mental status normal -500 cc normal saline was given in ED -Sodium chloride 1 g twice daily -Follow-up BMP every 8 hours -Fluid restriction  HLD (hyperlipidemia) -Lipitor  PAD (peripheral artery disease) (HCC): CT scan showed  extensive and stable chronic occlusion of the right iliac arterial system as well as chronic severe narrowing of the left iliac arterial system. Marked diminished abdominal aortic caliber down to 1.1 x 0.9 cm.  -On Lipitor and Eliquis -Need to give referral to vascular surgeon for outpatient workup  Fall at home, initial encounter: No acute injury.  CT head and neck negative. -Fall precaution -PT/OT       DVT ppx: on Eliquis  Code Status: Full code   Family Communication: not done, no family member is at bed side.      Disposition Plan:  Anticipate discharge back to previous environment  Consults called:  none  Admission status and Level of care: Progressive:    for obs   Dispo: The patient is from: Home              Anticipated d/c is to: Home              Anticipated d/c date is: 1 day              Patient currently is not medically stable to d/c.    Severity of Illness:  The appropriate patient status for this patient is OBSERVATION. Observation status is judged to be reasonable and necessary in order to provide the required intensity of service to ensure the patient's safety. The patient's presenting symptoms, physical exam findings, and initial radiographic and laboratory data in the context of their medical condition is felt to place them at decreased risk for further clinical deterioration. Furthermore, it is anticipated that the patient will be medically stable for discharge from the hospital within 2 midnights of admission.        Date of Service 03/22/2023    Lorretta Harp [facility]   If 7PM-7AM, please contact night-coverage [email or web address] 03/22/2023, 1:48 AM

## 2023-03-22 NOTE — Evaluation (Signed)
Physical Therapy Evaluation Patient Details Name: Darren Rogers MRN: [id number] DOB: July 23, 1955 Today's Date: 03/22/2023  History of Present Illness  67 y/o male presented to ED on 03/21/23 after fall and dizziness. Admitted for Afib with RVR. PMH: hx of Afib of Eliquis, HTN, CAD, recurrent alcoholic pancreatitis, polysubstance abuse  Clinical Impression  Patient admitted with the above. PTA, patient lives with sister and reports modI with no AD. Sister assists with iADLs but patient able to complete ADLs independently. Patient able to complete bed mobility modI and sit to stand with CGA with and without RW. Complaining of dizziness in standing and R LE pain (baseline). HR up to 151 after standing. Afib flagged on heart monitor with HR between 112-151 during session. Patient will benefit from skilled PT services during acute stay to address listed deficits. Patient will benefit from ongoing therapy at discharge to maximize functional independence and safety.         If plan is discharge home, recommend the following: Assistance with cooking/housework;Help with stairs or ramp for entrance;Assist for transportation   Can travel by private vehicle        Equipment Recommendations Rolling Aldahir Litaker (2 wheels)  Recommendations for Other Services       Functional Status Assessment Patient has had a recent decline in their functional status and demonstrates the ability to make significant improvements in function in a reasonable and predictable amount of time.     Precautions / Restrictions Precautions Precautions: Fall Restrictions Weight Bearing Restrictions: No      Mobility  Bed Mobility Overal bed mobility: Modified Independent                  Transfers Overall transfer level: Needs assistance Equipment used: None, Rolling Leland Staszewski (2 wheels) Transfers: Sit to/from Stand Sit to Stand: Contact guard assist           General transfer comment: complaining of dizziness  and R LE pain upon standing. Refused further mobility due to R LE pain. Deferred attempts 2/2 HR up to 151 after standing    Ambulation/Gait                  Stairs            Wheelchair Mobility     Tilt Bed    Modified Rankin (Stroke Patients Only)       Balance Overall balance assessment: Mild deficits observed, not formally tested, History of Falls                                           Pertinent Vitals/Pain Pain Assessment Pain Assessment: Faces Faces Pain Scale: Hurts little more Pain Location: R LE Pain Descriptors / Indicators: Grimacing, Guarding Pain Intervention(s): Limited activity within patient's tolerance, Monitored during session, Repositioned    Home Living Family/patient expects to be discharged to:: Private residence Living Arrangements: Other relatives Available Help at Discharge: Family;Available 24 hours/day Type of Home: House Home Access: Ramped entrance       Home Layout: One level Home Equipment: Agricultural consultant (2 wheels);Wheelchair - manual Additional Comments: sister drives patient    Prior Function Prior Level of Function : Independent/Modified Independent;History of Falls (last six months)             Mobility Comments: very limited distance secondary to leg pain (reports needing to see vascular surgery). Amb with no AD. Hx  of falls from dizziness ADLs Comments: Pt ambulates independently short distances without AD, performs ADLs Ind, and shares IADLs with sister. Sister drives them.     Extremity/Trunk Assessment   Upper Extremity Assessment Upper Extremity Assessment: Defer to OT evaluation    Lower Extremity Assessment Lower Extremity Assessment: Generalized weakness       Communication   Communication Communication: No apparent difficulties  Cognition Arousal: Alert Behavior During Therapy: WFL for tasks assessed/performed Overall Cognitive Status: Within Functional Limits for  tasks assessed                                          General Comments      Exercises     Assessment/Plan    PT Assessment Patient needs continued PT services  PT Problem List Decreased strength;Decreased activity tolerance;Decreased balance;Decreased mobility;Decreased knowledge of precautions;Cardiopulmonary status limiting activity;Decreased knowledge of use of DME;Decreased safety awareness       PT Treatment Interventions DME instruction;Gait training;Therapeutic activities;Functional mobility training;Therapeutic exercise;Balance training;Patient/family education    PT Goals (Current goals can be found in the Care Plan section)  Acute Rehab PT Goals Patient Stated Goal: to get better PT Goal Formulation: With patient Time For Goal Achievement: 04/05/23 Potential to Achieve Goals: Good    Frequency Min 1X/week     Co-evaluation               AM-PAC PT "6 Clicks" Mobility  Outcome Measure Help needed turning from your back to your side while in a flat bed without using bedrails?: None Help needed moving from lying on your back to sitting on the side of a flat bed without using bedrails?: None Help needed moving to and from a bed to a chair (including a wheelchair)?: A Little Help needed standing up from a chair using your arms (e.g., wheelchair or bedside chair)?: A Little Help needed to walk in hospital room?: A Little Help needed climbing 3-5 steps with a railing? : A Lot 6 Click Score: 19    End of Session   Activity Tolerance: Treatment limited secondary to medical complications (Comment) (dizziness) Patient left: in bed;with call bell/phone within reach;with bed alarm set Nurse Communication: Mobility status PT Visit Diagnosis: Unsteadiness on feet (R26.81);Muscle weakness (generalized) (M62.81);History of falling (Z91.81)    Time: 1330-1350 PT Time Calculation (min) (ACUTE ONLY): 20 min   Charges:   PT Evaluation $PT Eval  Moderate Complexity: 1 Mod   PT General Charges $$ ACUTE PT VISIT: 1 Visit         Maylon Peppers, PT, DPT Physical Therapist - [facility]  [facility]   Persephanie Laatsch A Carter Kassel 03/22/2023, 2:24 PM

## 2023-03-22 NOTE — ED Notes (Signed)
Digoxin not given due to not being sent up from pharmacy yet.

## 2023-03-22 NOTE — ED Notes (Signed)
Called pharmacy about amiodarone drip as it has been running at 30mg /hr not 60mg /hr. Pharmacy said to up the rate to 60mg /hr until 1000 then change rate to 30mg /hr.

## 2023-03-22 NOTE — TOC Benefit Eligibility Note (Signed)
Patient Product/process development scientist completed.    The patient is insured through [facility]. Patient has Medicare and is not eligible for a copay card, but may be able to apply for patient assistance, if available.    Ran test claim for Eliquis 5 mg and the current 30 day co-pay is $0.00.  Ran test claim for Entresto 24-26 mg and the current 30 day co-pay is $0.00.  Ran test claim for Farxiga 10 mg and the current 30 day co-pay is $0.00.  Ran test claim for Jardiance 10 mg and the current 30 day co-pay is $0.00.   This test claim was processed through [facility]- copay amounts may vary at other pharmacies due to pharmacy/plan contracts, or as the patient moves through the different stages of their insurance plan.     Roland Earl, CPHT Pharmacy Technician III Certified Patient Advocate [facility] Patient Advocate Team Direct Number: [phone]  Fax: [phone]

## 2023-03-22 NOTE — Evaluation (Signed)
Occupational Therapy Evaluation Patient Details Name: Darren Rogers MRN: [id number] DOB: [birth year] Today's Date: 03/22/2023   History of Present Illness 67 y/o male presented to ED on 03/21/23 after fall and dizziness. Admitted for Afib with RVR. PMH: hx of Afib of Eliquis, HTN, CAD, recurrent alcoholic pancreatitis, polysubstance abuse   Clinical Impression   Patient received for OT evaluation. See flowsheet below for details of function. Generally, patient independent for bed mobility, unable to perform functional mobility 2/2 dizziness and high HR, and set up for seated ADLs only; unable to maintain standing today. Patient will benefit from continued OT while in acute care.       If plan is discharge home, recommend the following: A lot of help with walking and/or transfers;A little help with bathing/dressing/bathroom;Assistance with cooking/housework;Assist for transportation;Direct supervision/assist for medications management;Help with stairs or ramp for entrance    Functional Status Assessment  Patient has had a recent decline in their functional status and demonstrates the ability to make significant improvements in function in a reasonable and predictable amount of time.  Equipment Recommendations  BSC/3in1    Recommendations for Other Services       Precautions / Restrictions Precautions Precautions: Fall Precaution Comments: watch HR Restrictions Weight Bearing Restrictions: No      Mobility Bed Mobility Overal bed mobility: Modified Independent                  Transfers Overall transfer level: Needs assistance Equipment used: None, Rolling walker (2 wheels) Transfers: Sit to/from Stand Sit to Stand: Contact guard assist           General transfer comment: complaining of dizziness and R LE pain upon standing. Stood once without RW and only able to stand a few seconds before needing to sit. Then tried RW to offweight from RLE pain; states it didn't  help much and still dizzy. Refused further mobility due to R LE pain. Deferred attempts 2/2 HR up to 151 after standing.      Balance Overall balance assessment: Mild deficits observed, not formally tested, History of Falls                                         ADL either performed or assessed with clinical judgement   ADL Overall ADL's : Needs assistance/impaired Eating/Feeding: Independent;Sitting   Grooming: Sitting;Set up;Oral care                       Toileting- Clothing Manipulation and Hygiene: Independent;Sitting/lateral lean (use of urinal) Toileting - Clothing Manipulation Details (indicate cue type and reason): pt able to use urinal seated at EOB independently     Functional mobility during ADLs:  (attempted to walk, but pt only able to stand today; unable to take steps forward; states he is dizzy. HR in 130's-140's at rest and in standing; highest seen was 151 while seated.) General ADL Comments: Limited today 2/2 decreased mobility. Pt states he put on his socks independently; wearing slip-on shoes. Able to use UE functionally.     Vision Patient Visual Report: No change from baseline       Perception         Praxis         Pertinent Vitals/Pain Pain Assessment Pain Assessment: Faces Faces Pain Scale: Hurts little more Pain Location: R LE Pain Descriptors / Indicators: Grimacing, Guarding Pain Intervention(s):  Limited activity within patient's tolerance, Monitored during session     Extremity/Trunk Assessment Upper Extremity Assessment Upper Extremity Assessment: Overall WFL for tasks assessed;Right hand dominant   Lower Extremity Assessment Lower Extremity Assessment: Generalized weakness       Communication Communication Communication: No apparent difficulties   Cognition Arousal: Alert Behavior During Therapy: WFL for tasks assessed/performed Overall Cognitive Status: Within Functional Limits for tasks assessed                                  General Comments: Provides hx information. Pleasant. Pt states he takes a lot of medications, but his medical record states he has been out of some of them.     General Comments  On room air. HR in 130's-151 during session.    Exercises     Shoulder Instructions      Home Living Family/patient expects to be discharged to:: Private residence Living Arrangements: Other relatives (sister) Available Help at Discharge: Family;Available 24 hours/day (lives with sister) Type of Home: House Home Access: Ramped entrance     Home Layout: One level     Bathroom Shower/Tub: Walk-in shower;Tub/shower unit;Sponge bathes at baseline   Bathroom Toilet: Standard     Home Equipment: Agricultural consultant (2 wheels);Wheelchair - manual   Additional Comments: sister drives patient      Prior Functioning/Environment Prior Level of Function : Independent/Modified Independent;History of Falls (last six months)             Mobility Comments: very limited distance secondary to leg pain (reports needing to see vascular surgery). Amb with no AD. Hx of falls from dizziness. Pt states that he has a chair placed halfway to his mailbox where he sits to take a break. ADLs Comments: Pt ambulates independently short distances without AD, performs ADLs Ind, and shares IADLs with sister. Sister drives them. sister does most cooking/grocery shopping, or they go out to eat.        OT Problem List: Decreased strength;Decreased activity tolerance;Impaired balance (sitting and/or standing)      OT Treatment/Interventions: Self-care/ADL training;Therapeutic exercise;DME and/or AE instruction;Therapeutic activities;Patient/family education    OT Goals(Current goals can be found in the care plan section) Acute Rehab OT Goals Patient Stated Goal: Get better OT Goal Formulation: With patient Time For Goal Achievement: 04/05/23 Potential to Achieve Goals: Good ADL  Goals Pt Will Perform Grooming: standing;with modified independence Pt Will Transfer to Toilet: with modified independence;regular height toilet;ambulating Pt Will Perform Toileting - Clothing Manipulation and hygiene: with modified independence;sit to/from stand  OT Frequency: Min 1X/week    Co-evaluation              AM-PAC OT "6 Clicks" Daily Activity     Outcome Measure Help from another person eating meals?: None Help from another person taking care of personal grooming?: A Little Help from another person toileting, which includes using toliet, bedpan, or urinal?: A Little Help from another person bathing (including washing, rinsing, drying)?: A Little Help from another person to put on and taking off regular upper body clothing?: None Help from another person to put on and taking off regular lower body clothing?: A Little 6 Click Score: 20   End of Session Equipment Utilized During Treatment: Rolling walker (2 wheels) Nurse Communication: Mobility status  Activity Tolerance: Other (comment) (limited by dizziness and increased HR) Patient left: in bed;with call bell/phone within reach;with bed alarm set  OT  Visit Diagnosis: Unsteadiness on feet (R26.81);History of falling (Z91.81)                Time: 8657-8469 OT Time Calculation (min): 21 min Charges:  OT General Charges $OT Visit: 1 Visit OT Evaluation $OT Eval Moderate Complexity: 1 Mod  Darren Rogers Junie Panning, MS, OTR/L  Alvester Morin 03/22/2023, 4:09 PM

## 2023-03-22 NOTE — Progress Notes (Signed)
Heart Failure Stewardship Pharmacy Note  PCP: Pcp, No PCP-Cardiologist: Lorine Bears, MD  HPI: Darren Rogers is a 67 y.o. male with polysubstance use (cocaine, alcohol, tobacco), recurrent alcoholic pancreatitis, A fib on Eliquis and digoxin, HTN, HLD, depression, CAD, PAD with claudication, BPH, PVC who presented with heart racing, fall, and mild shortness of breath. Patient had been nonadherent to HF and AF medications including Eliquis prior to admission. CXR on admission with no edema noted.  Pertinent cardiac history: TTE in 05/2015 showed LEF 60-65%. Moderate blockage noted on arterial doppler in 06/2015. Stress test in 06/2015 showed T wave inversion in I, II, III, V4, V5, V6 during stress. A 2% PVC burden was noted 04/2017 via long term event monitor. TTE 07/2020 showed LVEF 50-55%. TTE 20/2022 showed LVEF 55-60% and low normal RV function. Event monitor 04/2021 showed 100% AF burden with rate of 99. TTE 11/2022 showed LVEF down to 35-40%, normal RV, mild-moderate MR, moderate TR, mild AR, and moderate PR. LHC 11/2022 showed severe single-vessel disease with CTO of Lcx/LPLA with left-to-left collaterals. During that time PVR was 5, PCWP was 18, and CI was 2.1.  Pertinent Lab Values: Creatinine  Date Value Ref Range Status  09/21/2014 1.10 mg/dL Final    Comment:    9.81-1.91 NOTE: New Reference Range  08/07/14    Creatinine, Ser  Date Value Ref Range Status  03/22/2023 0.95 0.61 - 1.24 mg/dL Final   BUN  Date Value Ref Range Status  03/22/2023 8 8 - 23 mg/dL Final  47/82/9562 9 8 - 27 mg/dL Final  13/12/6576 15 mg/dL Final    Comment:    4-69 NOTE: New Reference Range  08/07/14    Potassium  Date Value Ref Range Status  03/22/2023 3.9 3.5 - 5.1 mmol/L Final  09/21/2014 3.5 mmol/L Final    Comment:    3.5-5.1 NOTE: New Reference Range  08/07/14    Sodium  Date Value Ref Range Status  03/22/2023 128 (L) 135 - 145 mmol/L Final  08/26/2021 140 134 - 144 mmol/L Final   09/21/2014 139 mmol/L Final    Comment:    135-145 NOTE: New Reference Range  08/07/14    B Natriuretic Peptide  Date Value Ref Range Status  03/21/2023 1,050.7 (H) 0.0 - 100.0 pg/mL Final    Comment:    Performed at [facility], [address] [place], [place] [address]   Magnesium  Date Value Ref Range Status  03/22/2023 1.8 1.7 - 2.4 mg/dL Final    Comment:    Performed at [facility], [address] [place], [place] [address]  09/21/2014 1.8 mg/dL Final    Comment:    4.4-0.1 THERAPEUTIC RANGE: 4-7 mg/dL TOXIC: > 10 mg/dL  ----------------------- NOTE: New Reference Range  08/07/14    Hemoglobin A1C  Date Value Ref Range Status  03/28/2014 6.4 (H) 4.2 - 6.3 % Final    Comment:    The American Diabetes Association recommends that a primary goal of therapy should be <7% and that physicians should reevaluate the treatment regimen in patients with HbA1c values consistently >8%.    HB A1C (BAYER DCA - WAIVED)  Date Value Ref Range Status  07/30/2016 6.1 <7.0 % Final    Comment:                                          Diabetic  Adult            <7.0                                       Healthy Adult        4.3 - 5.7                                                           (DCCT/NGSP) American Diabetes Association's Summary of Glycemic Recommendations for Adults with Diabetes: Hemoglobin A1c <7.0%. More stringent glycemic goals (A1c <6.0%) may further reduce complications at the cost of increased risk of hypoglycemia.    Digoxin Level  Date Value Ref Range Status  03/21/2023 <0.2 (L) 0.8 - 2.0 ng/mL Final    Comment:    RESULT CONFIRMED BY MANUAL DILUTION [facility] Performed at [facility], [address] [place], [place] [address]    TSH  Date Value Ref Range Status  08/26/2021 1.600 0.450 - 4.500 uIU/mL Final   Vital Signs: Admission weight: Temp:  [97.6 F (36.4 C)-98 F (36.7 C)] 97.7 F (36.5 C) (10/21 1031) Pulse  Rate:  [25-145] 128 (10/21 1033) Cardiac Rhythm: Atrial fibrillation (10/21 0626) Resp:  [17-25] 17 (10/21 1033) BP: (101-131)/(71-102) 119/71 (10/21 1033) SpO2:  [96 %-100 %] 100 % (10/21 1033) Weight:  [68 kg (150 lb)] 68 kg (150 lb) (10/20 1739)  Intake/Output Summary (Last 24 hours) at 03/22/2023 1046 Last data filed at 03/22/2023 1009 Gross per 24 hour  Intake 245.14 ml  Output --  Net 245.14 ml    Current Heart Failure Medications:  Loop diuretic: Furosemide 20 mg daily Beta-Blocker: Metoprolol succinate 100 mg BID ACEI/ARB/ARNI: none MRA: none SGLT2i: none Other: Digoxin 0.25 mg daily  Prior to admission Heart Failure Medications:  Loop diuretic: Furosemide 20 mg daily Beta-Blocker: Metoprolol succinate 100 mg BID ACEI/ARB/ARNI: Entresto 24-26 mg BID MRA: Spironolactone 12.5 mg daily SGLT2i: Other: Digoxin 0.25 mg daily *Patient has not filled medications in ~2-3 months  Assessment: 1. 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 NYHA class III symptoms.  -Symptoms: Reports short of breath, fatigue, and mild LEE. Denies orhtopnea. CO2 is low, but symptoms do not necessarily align with shock. May be due to alcoholism, however can consider checking lactate out of an abundance of caution. -Volume: BNP elevated on admission, however CXR showed no evidence of edema and urine color is dark. Suspect patient is likely euvolemic and BNP elevation is due primarily to AF. -Hemodynamics: Hypertensive in 180s while in room today, but BP has generally been 100-120s/80s. HR while in room was 130s, however recorded rates vary from 60-130s. Amiodarone infusion for rate control started. Given patient was not on consistent anticoagulation prior to admission, should consider that amiodarone runs the risk of cardioversion. Currently on Eliquis. -BB: Home metoprolol succinate 100 mg BID resumed.  -ACEI/ARB/ARNI: Prescribed Entresto PTA,  can consider resuming when BP is stable. -MRA: Consider starting prior to discharge. -SGLT2i:Consider starting prior to discharge. -Other: Digoxin level is low due to non-adherence.   Plan: 1) Medication changes recommended at this time: -Consider giving magnesium sulfate 2g IV to maintain Mg >2 and for  rate control   2) Patient assistance: -Copays pending  3) Education: - Patient has been educated on current HF medications and potential additions to HF medication regimen - Patient verbalizes understanding that over the next few months, these medication doses may change and more medications may be added to optimize HF regimen - Patient has been educated on basic disease state pathophysiology and goals of therapy  Medication Assistance / Insurance Benefits Check: Does the patient have prescription insurance?    Type of insurance plan:  Does the patient qualify for medication assistance through manufacturers or grants? Pending  Eligible grants and/or patient assistance programs: pending  Medication assistance applications in progress: pending  Medication assistance applications approved: pending Approved medication assistance renewals will be completed by: pending  Outpatient Pharmacy: Prior to admission outpatient pharmacy: [facility]     Please do not hesitate to reach out with questions or concerns,  Enos Fling, PharmD, CPP, BCPS Heart Failure Pharmacist  Phone - [phone] 03/22/2023 10:46 AM

## 2023-03-22 NOTE — Progress Notes (Signed)
Patient is seen and examined today morning.  He is admitted for A-fib with RVR, received multiple IV rate control medications.  Patient's heart rate is around 130.  He had a fall, on and off palpitations.  Patient got digoxin loading dose and home was resumed, continue metoprolol and restarted Eliquis.  Patient will be closely monitored for withdrawal symptoms.  Patient's sodium will be trended.  Discussed with utilization review and admission changed to full.  Further management as per clinical course.

## 2023-03-23 ENCOUNTER — Inpatient Hospital Stay: Payer: 59

## 2023-03-23 ENCOUNTER — Encounter: Payer: Self-pay | Admitting: Internal Medicine

## 2023-03-23 ENCOUNTER — Encounter (INDEPENDENT_AMBULATORY_CARE_PROVIDER_SITE_OTHER): Payer: 59 | Admitting: Nurse Practitioner

## 2023-03-23 DIAGNOSIS — I5022 Chronic systolic (congestive) heart failure: Secondary | ICD-10-CM | POA: Diagnosis not present

## 2023-03-23 DIAGNOSIS — I10 Essential (primary) hypertension: Secondary | ICD-10-CM | POA: Diagnosis not present

## 2023-03-23 DIAGNOSIS — I5023 Acute on chronic systolic (congestive) heart failure: Secondary | ICD-10-CM | POA: Diagnosis not present

## 2023-03-23 DIAGNOSIS — F1010 Alcohol abuse, uncomplicated: Secondary | ICD-10-CM | POA: Diagnosis not present

## 2023-03-23 DIAGNOSIS — I4891 Unspecified atrial fibrillation: Secondary | ICD-10-CM | POA: Diagnosis not present

## 2023-03-23 LAB — BASIC METABOLIC PANEL
Anion gap: 11 (ref 5–15)
BUN: 11 mg/dL (ref 8–23)
CO2: 22 mmol/L (ref 22–32)
Calcium: 9.1 mg/dL (ref 8.9–10.3)
Chloride: 100 mmol/L (ref 98–111)
Creatinine, Ser: 1.13 mg/dL (ref 0.61–1.24)
GFR, Estimated: >60 mL/min (ref >60)
Glucose, Bld: 127 mg/dL — ABNORMAL HIGH (ref 70–99)
Potassium: 4.1 mmol/L (ref 3.5–5.1)
Sodium: 133 mmol/L — ABNORMAL LOW (ref 135–145)

## 2023-03-23 LAB — BLOOD GAS, ARTERIAL
Acid-base deficit: 1.5 mmol/L (ref 0.0–2.0)
Bicarbonate: 21.1 mmol/L (ref 20.0–28.0)
FIO2: 28 %
O2 Content: 2 L/min
O2 Saturation: 97.6 %
Patient temperature: 37
pCO2 arterial: 29 mm[Hg] — ABNORMAL LOW (ref 32–48)
pH, Arterial: 7.47 — ABNORMAL HIGH (ref 7.35–7.45)
pO2, Arterial: 82 mm[Hg] — ABNORMAL LOW (ref 83–108)

## 2023-03-23 MED ORDER — PHENOBARBITAL SODIUM 65 MG/ML IJ SOLN
65.0000 mg | Freq: Once | INTRAMUSCULAR | Status: AC
  Administered 2023-03-23: 65 mg via INTRAVENOUS
  Filled 2023-03-23: qty 1

## 2023-03-23 MED ORDER — HALOPERIDOL LACTATE 5 MG/ML IJ SOLN: 2.0000 mg | Freq: Four times a day (QID) | INTRAMUSCULAR | Status: DC | PRN

## 2023-03-23 MED ORDER — FUROSEMIDE 10 MG/ML IJ SOLN
40.0000 mg | Freq: Every day | INTRAMUSCULAR | Status: DC
  Administered 2023-03-23 – 2023-03-24 (×2): 40 mg via INTRAVENOUS
  Filled 2023-03-23 (×2): qty 4

## 2023-03-23 MED ORDER — MAGNESIUM SULFATE 2 GM/50ML IV SOLN
2.0000 g | Freq: Once | INTRAVENOUS | Status: AC
  Administered 2023-03-23: 2 g via INTRAVENOUS
  Filled 2023-03-23: qty 50

## 2023-03-23 MED ORDER — LOSARTAN POTASSIUM 25 MG PO TABS
25.0000 mg | ORAL_TABLET | Freq: Every day | ORAL | Status: DC
  Administered 2023-03-23 – 2023-03-24 (×2): 25 mg via ORAL
  Filled 2023-03-23 (×2): qty 1

## 2023-03-23 MED ORDER — DIGOXIN 125 MCG PO TABS
0.1250 mg | ORAL_TABLET | Freq: Every day | ORAL | Status: DC
  Administered 2023-03-24 – 2023-03-26 (×3): 0.125 mg via ORAL
  Filled 2023-03-23 (×3): qty 1

## 2023-03-23 NOTE — Progress Notes (Signed)
Occupational Therapy Treatment Patient Details Name: Darren Rogers MRN: [id number] DOB: 17-Nov-1955 Today's Date: 03/23/2023   History of present illness 67 y/o male presented to ED on 03/21/23 after fall and dizziness. Admitted for Afib with RVR. PMH: hx of Afib of Eliquis, HTN, CAD, recurrent alcoholic pancreatitis, polysubstance abuse   OT comments  Pt seen for OT tx. Charge RN present and clears OT to attempt to stand pt in effort to assess mobility as nursing prepares to transfer pt to another room. Pt very lethargic requiring sternal rub and intermittent VC to maintain alertness. Pt oriented x3, HR 90, SpO2 90% on room air (pt had removed the nasal cannula). Pt u.ltimately requiring MOD A for bed mobility and MIN-MOD A to maintain static sitting balance. Pt returned to bed. RN notified. Pt continues to benefit from skilled OT services.       If plan is discharge home, recommend the following:  Two people to help with walking and/or transfers;A lot of help with bathing/dressing/bathroom   Equipment Recommendations  BSC/3in1    Recommendations for Other Services      Precautions / Restrictions Precautions Precautions: Fall Precaution Comments: watch HR Restrictions Weight Bearing Restrictions: No       Mobility Bed Mobility Overal bed mobility: Needs Assistance Bed Mobility: Supine to Sit, Sit to Supine     Supine to sit: Mod assist, HOB elevated Sit to supine: Mod assist   General bed mobility comments: MAX VC    Transfers                   General transfer comment: unsafe to attempt     Balance Overall balance assessment: Needs assistance Sitting-balance support: Bilateral upper extremity supported, Feet supported Sitting balance-Leahy Scale: Poor Sitting balance - Comments: required Min to Mod A to maintain static sitting balance                                   ADL either performed or assessed with clinical judgement   ADL                                               Extremity/Trunk Assessment              Vision       Perception     Praxis      Cognition Arousal: Lethargic Behavior During Therapy: Flat affect Overall Cognitive Status: No family/caregiver present to determine baseline cognitive functioning                                 General Comments: oriented x3, lethargic, requiring sternal rub and additional VC to attend        Exercises      Shoulder Instructions       General Comments Pt had removed nasal cannula and was asleep upon OT's arrival. SpO2 on room air 90%, HR 89-90. Nasal cannula reapplied.    Pertinent Vitals/ Pain       Pain Assessment Pain Assessment: Faces Faces Pain Scale: Hurts little more Pain Location: "all over" Pain Descriptors / Indicators: Moaning, Grimacing Pain Intervention(s): Limited activity within patient's tolerance, Monitored during session, Repositioned  Home Living  Prior Functioning/Environment              Frequency  Min 1X/week        Progress Toward Goals  OT Goals(current goals can now be found in the care plan section)  Progress towards OT goals: Not progressing toward goals - comment (limited by lethargy this date)  Acute Rehab OT Goals Patient Stated Goal: get better OT Goal Formulation: With patient Time For Goal Achievement: 04/05/23 Potential to Achieve Goals: Fair  Plan      Co-evaluation                 AM-PAC OT "6 Clicks" Daily Activity     Outcome Measure   Help from another person eating meals?: A Little Help from another person taking care of personal grooming?: A Lot Help from another person toileting, which includes using toliet, bedpan, or urinal?: Total Help from another person bathing (including washing, rinsing, drying)?: A Lot Help from another person to put on and taking off regular upper body  clothing?: A Lot Help from another person to put on and taking off regular lower body clothing?: A Lot 6 Click Score: 12    End of Session    OT Visit Diagnosis: Unsteadiness on feet (R26.81);History of falling (Z91.81)   Activity Tolerance Patient limited by lethargy   Patient Left in bed;with call bell/phone within reach;with bed alarm set   Nurse Communication Mobility status (notified charge pt unable to get to a chair to transfer rooms)        Time: 1308-6578 OT Time Calculation (min): 13 min  Charges: OT General Charges $OT Visit: 1 Visit OT Treatments $Self Care/Home Management : 8-22 mins  Arman Filter., MPH, MS, OTR/L ascom [phone] 03/23/23, 2:24 PM

## 2023-03-23 NOTE — Progress Notes (Signed)
PT Cancellation Note  Patient Details Name: Darren Rogers MRN: [id number] DOB: 09-03-1955   Cancelled Treatment:    Reason Eval/Treat Not Completed: Other (comment). Per chart review and conversation with RN, pt not appropriate for exertional activity at this time. Pt lethargic, only following simple commands. Vitals elevated. Will re-attempt next date.   Darren Rogers 03/23/2023, 1:59 PM Elizabeth Palau, PT, DPT, GCS [phone]

## 2023-03-23 NOTE — Progress Notes (Signed)
Progress Note   Patient: Darren Rogers EXB:[id number] DOB: 09/11/1955 DOA: 03/21/2023     1 DOS: the patient was seen and examined on 03/23/2023   Brief hospital course: Kardin Kravec is a 67 y.o. male with medical history significant of of significant of polysubstance use (cocaine, alcohol, tobacco), recurrent alcoholic pancreatitis, A fib on Eliquis and digoxin, HTN, HLD, depression, CAD, claudication, BPH, PVC, who presents with fall and heart racing.   Patient is admitted to hospitalist service for Afib with RVR.  Assessment and Plan: Atrial fibrillation with RVR He got IV beta blocker, IV digoxin load. Home dose Digoxin restarted, Eliquis resumed. Continue Metaprolol therapy. Cardiology consulted for adjustment of rate control medications.  S/p fall at home No head injury. Continue telemetry, Will check orthostatic vitals. Fall, aspiration precautions.  Chronic Systolic CHF: Echo shows EF 35%. Will restart Losartan, Aldactone. IV Lasix 40 daily. Monitor daily weights, strict I/O.  Elevated troponin- Demand ischemia in the setting of Afib. Troponin trended down. Follow cardiology recommendations.  Hyponatremia- Likely hypervolemic. Restarted Lasix, aldactone. Sodium improving.  HLD (hyperlipidemia) Continue Lipitor   PAD (peripheral artery disease) (HCC): CT scan showed extensive and stable chronic occlusion of the right iliac arterial system as well as chronic severe narrowing of the left iliac arterial system. Marked diminished abdominal aortic caliber down to 1.1 x 0.9 cm.  Continue Lipitor and Eliquis. Outpatient vascular follow up.  Polysubstance abuse- He will need counseling once more alert and awake.     PT/ OT evaluation and follow up. Nursing supportive care. Fall, aspiration precautions. DVT prophylaxis   Code Status: Full Code  Subjective: Patient is seen and examined today morning. He is lying in bed. Not a good historian. Does not feel good,  eating poor. HR in 120.   Physical Exam: Vitals:   03/23/23 1004 03/23/23 1052 03/23/23 1938 03/23/23 1956  BP: (!) 129/103 (!) 151/129 (!) 155/108   Pulse: (!) 129 (!) 126 (!) 58 98  Resp: 20 18 18    Temp: 98.1 F (36.7 C) 98 F (36.7 C) 98.4 F (36.9 C)   TempSrc:      SpO2: 98% 100% 98%   Weight:      Height:        General - Elderly African American male, mild respiratory distress HEENT - PERRLA, EOMI, atraumatic head, non tender sinuses. Lung - Clear, basal rales, diffuse rhonchi Heart - S1, S2 heard, no murmurs, rubs, 1+ pedal edema. Abdomen - Soft, non tender, bowel sounds good Neuro - Alert, awake and oriented, non focal exam. Skin - Warm and dry.  Data Reviewed:      Latest Ref Rng & Units 03/22/2023   12:01 PM 03/21/2023    5:42 PM 12/23/2022   12:29 PM  CBC  WBC 4.0 - 10.5 K/uL 4.6  4.8    Hemoglobin 13.0 - 17.0 g/dL 10.2  72.5  36.6   Hematocrit 39.0 - 52.0 % 39.2  40.3  38.0   Platelets 150 - 400 K/uL 160  172        Latest Ref Rng & Units 03/23/2023   12:14 AM 03/22/2023    6:01 PM 03/22/2023   12:01 PM  BMP  Glucose 70 - 99 mg/dL 440  347  425   BUN 8 - 23 mg/dL 11  10  8    Creatinine 0.61 - 1.24 mg/dL 9.56  3.87  5.64   Sodium 135 - 145 mmol/L 133  133  132   Potassium 3.5 -  5.1 mmol/L 4.1  4.0  4.8   Chloride 98 - 111 mmol/L 100  101  99   CO2 22 - 32 mmol/L 22  21  22    Calcium 8.9 - 10.3 mg/dL 9.1  8.8  8.9    DG Chest Port 1 View  Result Date: 03/23/2023 CLINICAL DATA:  Tachypnea and shortness of breath. EXAM: PORTABLE CHEST 1 VIEW COMPARISON:  12/20/2022 FINDINGS: Cardiac enlargement. Pulmonary vascular congestion with mild interstitial edema. No airspace consolidation. No pleural effusions identified. Visualized osseous structures appear intact. IMPRESSION: Cardiac enlargement, pulmonary vascular congestion and mild interstitial edema. Electronically Signed   By: Signa Kell M.D.   On: 03/23/2023 06:17     Disposition: Status is:  Inpatient Remains inpatient appropriate because: Afib rate control.  Planned Discharge Destination: Home with Home Health     MDM level 3- Patient is admitted for afib with RVR, has multiple comorbid conditions including CHF, PAD. Has electrolyte abnormalities, need IV diuresis. He is at high risk for sudden clinical deterioration.  Author: Marcelino Duster, MD 03/23/2023 8:39 PM Secure chat 7am to 7pm For on call review [email or web address].

## 2023-03-23 NOTE — Consult Note (Signed)
Cardiology Consult    Patient ID: Darren Rogers MRN: [id number], DOB/AGE: 68-Jun-1957   Admit date: 03/21/2023 Date of Consult: 03/23/2023  Primary Physician: Darren Rogers, No Primary Cardiologist: Darren Bears, MD Requesting Provider: Dorris Carnes. Darren Dales, MD  Patient Profile    Darren Rogers is a 67 y.o. male with a history of chronic HFrEF, mixed ischemic and nonischemic cardiomyopathy, persistent atrial fibrillation coronary artery disease, hypertension, hyperlipidemia, polysubstance abuse, and medication nonadherence, who is being seen today for the evaluation of rapid atrial fibrillation and CHF at the request of Darren Rogers.  Past Medical History  Subjective   Past Medical History:  Diagnosis Date   Chronic HFrEF (heart failure with reduced ejection fraction) (HCC)    a. 03/29/2014: EF 55-60%, mild LVH, normal RVSP;  b. 05/2015 Echo: EF 60-65%, triv AI, mild MR; c. 03/2021 Echo: EF 55-60%, no rwma, mild LVH, nl RV fxn, mildly dil LA, mild MR; d. 11/2022 Echo: EF 35-40%, mild LVH, nl RV fxn, RVSP 53.30mmhg. Mild BAE. Mild to mod MR/TR/PR. Mild AI. Ao root 42mm.   Claudication (HCC)    a. 06/2015 ABI: R - 0.73, L - 0.73. 30-49% bilat SFA stenosis. 50-74% R Profunda stenosis; b. 01/2017 ABI: R 0.89, L 0.88, TBI R 0.65, L 1.0.   Erectile dysfunction    Essential hypertension    Hyperlipidemia    Non-obstructive CAD    a. 01/27/2014 Cath: LM nl, LAD mild diff dz w/o obs, LCX no sig obs - scattered 20-30% mLCx, RCA no obs dz, EF 70%; b. 06/2015 MV; EF 60%, no ischemia; c. 07/2020 MV: No ischemia/scar; d. 11/2022 Cath: LM nl, LAD 35ost/p, LCX 20d, LPDA fills from dLAD, LPAV 100 CTO, RCA small, nl.   Persistent atrial fibrillation (HCC)    a. Pt says Dx >53yrs ago w/ ? RFCA @ [facility];  b. Recurrent 12/2013;  b. Rx Flecainide and Xarelto-->Intermittent compliance; c. 03/2021 Zio: 100% Afib w/ avg rate of 99 bpm. Rare PVCs.   PVC's (premature ventricular contractions)    a. 05/2017 Zio: 4000 PVCs in 48 hrs (2%).  Brief run of SVT.   Tobacco abuse    a. ongoing - 1 ppd.    Past Surgical History:  Procedure Laterality Date   CARDIAC CATHETERIZATION     CARDIAC CATHETERIZATION     [facility]   LEFT HEART CATH Right 01/27/2014   Procedure: LEFT HEART CATH;  Surgeon: Darren Chapman, MD;  Location: [facility] CATH LAB;  Service: Cardiovascular;  Laterality: Right;   RIGHT/LEFT HEART CATH AND CORONARY ANGIOGRAPHY N/A 12/23/2022   Procedure: RIGHT/LEFT HEART CATH AND CORONARY ANGIOGRAPHY;  Surgeon: Darren Kendall, MD;  Location: [facility] INVASIVE CV LAB;  Service: Cardiovascular;  Laterality: N/A;     Allergies  No Known Allergies    History of Present Illness     67 y.o. male with a history of chronic HFrEF, mixed ischemic and nonischemic cardiomyopathy, persistent atrial fibrillation coronary artery disease, hypertension, hyperlipidemia, polysubstance abuse, and medication non-adherence.  Darren Rogers has a long history of persistent atrial fibrillation and previously reported some type of catheter ablation was in 20 years ago at [facility].  He had recent current atrial fibrillation August 2050 in the setting of chest pain and underwent diagnostic catheterization showing mild, nonobstructive CAD.  He was initially placed on flecainide and Xarelto however, compliance was poor.  In the setting of noncompliance, we have been unable to successfully pursue a rhythm control strategy.  He was hospitalized on 3 occasions in 2022 in  the setting of rapid atrial fibrillation and atypical chest pain.  It was felt that A-fib was likely transitioning to permanent.  Subsequent event monitor showed 100% A-fib burden with an average heart rate of 92 bpm.  In July 2024, patient was admitted with dyspnea and chest pain, in the setting of rapid atrial fibrillation and medication noncompliance.  Troponin rose to 101.  Echocardiogram showed new LV dysfunction with an EF of 35 to 40%.  Diagnostic catheterization was performed and revealed an occluded L  PIV with otherwise nonobstructive disease, and he was medically managed.  He has since been lost to follow-up.   Darren Rogers says that for the past 3 years, he has been dealing w/ DOE, fatigue, and lower ext edema.  As outlined above, he has had multiple admissions in that timeframe.  He has been off of all of his medications for a few months, however, he's unsure just how long.  He continues to drink a 40 ounce beer daily.  This morning, Darren Rogers is not able to provide [place] a full history of present illness.  He simply says that he was experiencing more fatigue, dyspnea, and swelling.  Per H&P, he presented to the emergency department on October 20 following several episodes of nonbilious, nonbloody emesis followed by lightheadedness and accidental fall without loss of consciousness.  In the ED, he was found to be in A-fib with RVR with rates to the 140s.  He was treated with metoprolol and IV diltiazem with some improvement in rates.  Labs on arrival notable for hyponatremia with a sodium of 127, BNP of 1050.7, normal H&H, and mild troponin elevation with downward trend of 24  21.  CT of the head and cervical spine showed no acute abnormalities.  CT of the abdomen and pelvis with contrast showed no acute intra-abdominal, intrapelvic traumatic injury or fracture.  Chronic bilateral iliac disease and distal aortic narrowing noted.  Chest x-ray performed this morning showed cardiac enlargement with pulmonary vascular congestion and mild interstitial edema.  Currently Darren Rogers is very groggy without specific complaints.  Inpatient Medications  Subjective    apixaban  5 mg Oral BID   atorvastatin  40 mg Oral Daily   [START ON 03/24/2023] digoxin  0.125 mg Oral Daily   folic acid  1 mg Oral Daily   furosemide  40 mg Intravenous Daily   LORazepam  0-4 mg Oral Q6H   Followed by   LORazepam  0-4 mg Oral Q12H   metoprolol succinate  100 mg Oral BID   multivitamin with minerals  1 tablet Oral Daily    nicotine  21 mg Transdermal Daily   pantoprazole  40 mg Oral Daily   sodium chloride  1 g Oral BID WC   thiamine  100 mg Oral Daily   Or   thiamine  100 mg Intravenous Daily    Family History    Family History  Problem Relation Age of Onset   Coronary artery disease Father 88   Diabetes Mother    Diabetes Sister    Diabetes Brother    Diabetes Maternal Grandmother    Diabetes Sister    Diabetes Sister    He indicated that his mother is deceased. He indicated that his father is deceased. He indicated that all of his three sisters are alive. He indicated that only one of his four brothers is alive. He indicated that his maternal grandmother is deceased. He indicated that his maternal grandfather is deceased. He indicated  that his paternal grandmother is deceased. He indicated that his paternal grandfather is deceased.   Social History    Social History   Socioeconomic History   Marital status: Single    Spouse name: Not on file   Number of children: Not on file   Years of education: Not on file   Highest education level: Not on file  Occupational History   Not on file  Tobacco Use   Smoking status: Every Day    Current packs/day: 0.50    Average packs/day: 0.5 packs/day for 30.0 years (15.0 ttl pk-yrs)    Types: Cigarettes   Smokeless tobacco: Never  Vaping Use   Vaping status: Never Used  Substance and Sexual Activity   Alcohol use: Yes    Comment: 2 40 ounce beers a day   Drug use: Not Currently    Types: Cocaine   Sexual activity: Not on file  Other Topics Concern   Not on file  Social History Narrative   The patient lives in [place] with his sister. He works in a substance abuse program. 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 He is a long-time smoker, one pack per day. No illicit drugs. He is not married.   Social Determinants of Health   Financial Resource Strain: Not on file  Food Insecurity: No Food Insecurity  (03/22/2023)   Hunger Vital Sign    Worried About Running Out of Food in the Last Year: Never true    Ran Out of Food in the Last Year: Never true  Transportation Needs: No Transportation Needs (03/22/2023)   PRAPARE - Administrator, Civil Service (Medical): No    Lack of Transportation (Non-Medical): No  Physical Activity: Not on file  Stress: Not on file  Social Connections: Not on file  Intimate Partner Violence: Not At Risk (03/22/2023)   Humiliation, Afraid, Rape, and Kick questionnaire    Fear of Current or Ex-Partner: No    Emotionally Abused: No    Physically Abused: No    Sexually Abused: No     Review of Systems    General:  +++ generalized malaise and fatigue.  No chills, fever, night sweats or weight changes.  Cardiovascular:  No chest pain, +++ dyspnea on exertion, +++ LE edema, orthopnea, palpitations, paroxysmal nocturnal dyspnea. Dermatological: No rash, lesions/masses Respiratory: No cough, +++ dyspnea Urologic: No hematuria, dysuria Abdominal:   +++ nausea, +++ vomiting, no diarrhea, bright red blood per rectum, melena, or hematemesis Neurologic:  No visual changes, wkns, changes in mental status. All other systems reviewed and are otherwise negative except as noted above.    Objective  Physical Exam    Blood pressure (!) 151/129, pulse (!) 126, temperature 98 F (36.7 C), resp. rate 18, height 5\' 6"  (1.676 m), weight 68 kg, SpO2 100%.  General: Pleasant, NAD Psych: Flat affect. Neuro: Alert and oriented X 3. Moves all extremities spontaneously. HEENT: Normal  Neck: Supple without bruits.  Difficult to gauge JVP secondary to body habitus and positioning. Lungs:  Resp regular and unlabored, coarse breath sounds throughout. Heart: Irregularly irregular, tachycardic, no s3, s4, or murmurs. Abdomen: Soft, non-tender, non-distended, BS + x 4.  Extremities: No clubbing, cyanosis or edema. DP/PT2+, Radials 2+ and equal bilaterally.  Labs     Cardiac Enzymes Recent Labs  Lab 03/21/23 1742 03/21/23 2001  TROPONINIHS 24* 21*     BNP    Component Value Date/Time  BNP 1,050.7 (H) 03/21/2023 1742   Lab Results  Component Value Date   WBC 4.6 03/22/2023   HGB 13.6 03/22/2023   HCT 39.2 03/22/2023   MCV 93.8 03/22/2023   PLT 160 03/22/2023    Recent Labs  Lab 03/23/23 0014  NA 133*  K 4.1  CL 100  CO2 22  BUN 11  CREATININE 1.13  CALCIUM 9.1  GLUCOSE 127*   Lab Results  Component Value Date   CHOL 194 03/22/2023   HDL 72 03/22/2023   LDLCALC 106 (H) 03/22/2023   TRIG 80 03/22/2023   Lab Results  Component Value Date   DDIMER 0.83 (H) 09/12/2020      Radiology Studies    DG Chest Port 1 View  Result Date: 03/23/2023 CLINICAL DATA:  Tachypnea and shortness of breath. EXAM: PORTABLE CHEST 1 VIEW COMPARISON:  12/20/2022 FINDINGS: Cardiac enlargement. Pulmonary vascular congestion with mild interstitial edema. No airspace consolidation. No pleural effusions identified. Visualized osseous structures appear intact. IMPRESSION: Cardiac enlargement, pulmonary vascular congestion and mild interstitial edema. Electronically Signed   By: Signa Kell M.D.   On: 03/23/2023 06:17   CT Head Wo Contrast  Result Date: 03/21/2023 CLINICAL DATA:  Recent fall EXAM: CT HEAD WITHOUT CONTRAST CT CERVICAL SPINE WITHOUT CONTRAST TECHNIQUE: Multidetector CT imaging of the head and cervical spine was performed following the standard protocol without intravenous contrast. Multiplanar CT image reconstructions of the cervical spine were also generated. RADIATION DOSE REDUCTION: This exam was performed according to the departmental dose-optimization program which includes automated exposure control, adjustment of the mA and/or kV according to patient size and/or use of iterative reconstruction technique. COMPARISON:  None Available. FINDINGS: CT HEAD FINDINGS Brain: No evidence of acute infarction, hemorrhage, hydrocephalus,  extra-axial collection or mass lesion/mass effect. Chronic atrophic and ischemic changes are noted. Mild basal ganglia calcifications seen. Vascular: No hyperdense vessel or unexpected calcification. Skull: Normal. Negative for fracture or focal lesion. Sinuses/Orbits: No acute finding. Other: None. CT CERVICAL SPINE FINDINGS Alignment: Loss of the normal cervical lordosis is noted. Skull base and vertebrae: 7 cervical segments are well visualized. Vertebral body height is well maintained. No acute fracture or acute facet abnormality is noted. Osteophytic change and facet hypertrophic changes are seen. Soft tissues and spinal canal: Surrounding soft tissue structures are within normal limits. No focal hematoma is seen. Upper chest: Visualized lung apices are within normal limits. Other: None IMPRESSION: CT of the head: No acute intracranial abnormality noted. CT of the cervical spine: Degenerative change without acute abnormality. Electronically Signed   By: Alcide Clever M.D.   On: 03/21/2023 21:27   CT Cervical Spine Wo Contrast  Result Date: 03/21/2023 CLINICAL DATA:  Recent fall EXAM: CT HEAD WITHOUT CONTRAST CT CERVICAL SPINE WITHOUT CONTRAST TECHNIQUE: Multidetector CT imaging of the head and cervical spine was performed following the standard protocol without intravenous contrast. Multiplanar CT image reconstructions of the cervical spine were also generated. RADIATION DOSE REDUCTION: This exam was performed according to the departmental dose-optimization program which includes automated exposure control, adjustment of the mA and/or kV according to patient size and/or use of iterative reconstruction technique. COMPARISON:  None Available. FINDINGS: CT HEAD FINDINGS Brain: No evidence of acute infarction, hemorrhage, hydrocephalus, extra-axial collection or mass lesion/mass effect. Chronic atrophic and ischemic changes are noted. Mild basal ganglia calcifications seen. Vascular: No hyperdense vessel or  unexpected calcification. Skull: Normal. Negative for fracture or focal lesion. Sinuses/Orbits: No acute finding. Other: None. CT CERVICAL SPINE FINDINGS Alignment:  Loss of the normal cervical lordosis is noted. Skull base and vertebrae: 7 cervical segments are well visualized. Vertebral body height is well maintained. No acute fracture or acute facet abnormality is noted. Osteophytic change and facet hypertrophic changes are seen. Soft tissues and spinal canal: Surrounding soft tissue structures are within normal limits. No focal hematoma is seen. Upper chest: Visualized lung apices are within normal limits. Other: None IMPRESSION: CT of the head: No acute intracranial abnormality noted. CT of the cervical spine: Degenerative change without acute abnormality. Electronically Signed   By: Alcide Clever M.D.   On: 03/21/2023 21:27   CT ABDOMEN PELVIS W CONTRAST  Result Date: 03/21/2023 CLINICAL DATA:  Abdominal pain, acute, nonlocalized a fall. Pt has been drinking tonight. No LOC, pt is on blood thinners but has not been compliant with his medications for a while. Pt c/o dizziness, CP before EMS arrived. EXAM: CT ABDOMEN AND PELVIS WITH CONTRAST TECHNIQUE: Multidetector CT imaging of the abdomen and pelvis was performed using the standard protocol following bolus administration of intravenous contrast. RADIATION DOSE REDUCTION: This exam was performed according to the departmental dose-optimization program which includes automated exposure control, adjustment of the mA and/or kV according to patient size and/or use of iterative reconstruction technique. CONTRAST:  OMNIPAQUE IOHEXOL 300 MG/ML  SOLN COMPARISON:  CT abdomen pelvis 09/20/2022 FINDINGS: Lower chest: Enlarged heart.  No acute abnormality. Hepatobiliary: Not enlarged. Diffusely hypodense hepatic parenchyma. No focal lesion. No laceration or subcapsular hematoma. The gallbladder is otherwise unremarkable with no radio-opaque gallstones. No biliary  ductal dilatation. Pancreas: Normal pancreatic contour. No main pancreatic duct dilatation. Spleen: Not enlarged. No focal lesion. No laceration, subcapsular hematoma, or vascular injury. Adrenals/Urinary Tract: No nodularity bilaterally. Bilateral kidneys enhance symmetrically. No hydronephrosis. No contusion, laceration, or subcapsular hematoma. No injury to the vascular structures or collecting systems. No hydroureter. The urinary bladder is decompressed. Stomach/Bowel: No small or large bowel wall thickening or dilatation. The appendix is unremarkable. Vasculature/Lymphatics: Severe atherosclerotic plaque with chronic occlusion of the right iliac arterial system as well as chronic severe narrowing of the left iliac arterial system. Marked diminished aortic caliber down to 1.1 x 0.9 cm. Reconstitution distally of a markedly diminished right external iliac artery and femoral artery noted. Findings appear to be stable. Arterial collaterals noted. No abdominal aorta or iliac aneurysm. No active contrast extravasation or pseudoaneurysm. No abdominal, pelvic, inguinal lymphadenopathy. Reproductive: Prostate is unremarkable. Other: No simple free fluid ascites. No pneumoperitoneum. No hemoperitoneum. No mesenteric hematoma identified. No organized fluid collection. Musculoskeletal: No significant soft tissue hematoma. No acute pelvic fracture. No spinal fracture. Severe degenerative changes of the L4-S1 levels. Associated intervertebral disc space vacuum phenomenon posterior disc osteophyte complex formation. No associated severe osseous neural foraminal or central canal stenosis. Ports and Devices: None. IMPRESSION: 1.  No acute  intra-abdominal, intrapelvic traumatic injury. 2. No acute fracture or traumatic malalignment of the lumbar spine. Other imaging findings of potential clinical significance: 1. Cardiomegaly. 2. Aortic Atherosclerosis (ICD10-I70.0)-extensive and stable with chronic occlusion of the right iliac  arterial system as well as chronic severe narrowing of the left iliac arterial system. Marked diminished abdominal aortic caliber down to 1.1 x 0.9 cm. Recommend outpatient vascular consultation. 3. Hepatic steatosis. Electronically Signed   By: Tish Frederickson M.D.   On: 03/21/2023 21:22      ECG & Cardiac Imaging    Afib, 143, delayed R progression, mild inflat ST depression - personally reviewed.  Assessment & Plan  1.  Permanent atrial fibrillation with rapid ventricular response: Patient with a long history of A-fib complicated by noncompliance and thus an inability to pursue rhythm control.  He presented 10/20 w/ fatigue, dyspnea, and rapid afib - he's been off his meds for at least a month.  He was placed on amio in the ED.  We've d/c'd given lack of anticoagulation at home.  Prior home doses of ? blocker and digoxin have been resumed.  Of note, he achieved relative rate control on these meds in July, when hospitalized.  Cont eliquis.    2.  Acute on chronic HFrEF/mixed ischemic and nonischemic cardiomyopathy:  EF 35-40% by echo in July 2024, at which time he was found to have an occluded LPAV, and otherwise moderate, nonobstructive CAD.  In the setting of noncompliance, he reports increasing dyspnea and lower extremity edema.  His BNP was elevated on arrival at 1050.  Chest x-ray this morning showed pulmonary edema.  Body habitus makes gauging volume status challenging.  We have switched him from p.o. to intravenous Lasix.  Continue beta-blocker.  He was previously on Entresto but was noncompliant.  Will add ARB.  Poor candidate for MRA due to lack of follow-up and lab requirements.  Consider SGLT2 inhibitor though likely cost prohibitive.  3.  CAD/demand ischemia: Known LPAV occlusion with otherwise moderate, nonobstructive disease.  Denies chest pain.  Troponin minimally elevated with downtrend of 24  21.  Continue beta-blocker and statin.  No aspirin in the setting of Eliquis.  4.   Hyperlipidemia: Continue statin therapy.  LDL 106 in the setting of noncompliance.  5.  Polysubstance abuse: Continues to smoke and drink.  Complete cessation advised.  DT prophylaxis per IM.  Would benefit from outpatient counseling and social work.  6.  PAD:  CT this admission w/ bilateral iliac dzs and severe narrowing of distal Ao.  Cont statin.  Needs outpt vascular surgery f/u.   7.  Medication nonadherence: As previously mentioned on prior admissions, this is the crux of most of his medical issues.  Stressed the importance of compliance.  Risk Assessment/Risk Scores:        New York Heart Association (NYHA) Functional Class NYHA Class III  CHA2DS2-VASc Score =     This indicates a  % annual risk of stroke. The patient's score is based upon:      Signed, Nicolasa Ducking, NP 03/23/2023, 2:30 PM  For questions or updates, please contact   Please consult [email or web address] for contact info under Cardiology/STEMI.

## 2023-03-23 NOTE — Progress Notes (Signed)
Heart Failure Stewardship Pharmacy Note  PCP: Pcp, No PCP-Cardiologist: Lorine Bears, MD  HPI: Darren Rogers is a 67 y.o. male with polysubstance use (cocaine, alcohol, tobacco), recurrent alcoholic pancreatitis, A fib on Eliquis and digoxin, HTN, HLD, depression, CAD, PAD with claudication, BPH, PVC who presented with heart racing, fall, and mild shortness of breath. Patient had been nonadherent to HF and AF medications including Eliquis prior to admission. CXR on admission with no edema noted.  Pertinent cardiac history: TTE in 05/2015 showed LEF 60-65%. Moderate blockage noted on arterial doppler in 06/2015. Stress test in 06/2015 showed T wave inversion in I, II, III, V4, V5, V6 during stress. A 2% PVC burden was noted 04/2017 via long term event monitor. TTE 07/2020 showed LVEF 50-55%. TTE 20/2022 showed LVEF 55-60% and low normal RV function. Event monitor 04/2021 showed 100% AF burden with rate of 99. TTE 11/2022 showed LVEF down to 35-40%, normal RV, mild-moderate MR, moderate TR, mild AR, and moderate PR. LHC 11/2022 showed severe single-vessel disease with CTO of Lcx/LPLA with left-to-left collaterals. During that time PVR was 5, PCWP was 18, and CI was 2.1.  Pertinent Lab Values: Creatinine  Date Value Ref Range Status  09/21/2014 1.10 mg/dL Final    Comment:    1.32-4.40 NOTE: New Reference Range  08/07/14    Creatinine, Ser  Date Value Ref Range Status  03/23/2023 1.13 0.61 - 1.24 mg/dL Final   BUN  Date Value Ref Range Status  03/23/2023 11 8 - 23 mg/dL Final  03/28/2535 9 8 - 27 mg/dL Final  64/40/3474 15 mg/dL Final    Comment:    2-59 NOTE: New Reference Range  08/07/14    Potassium  Date Value Ref Range Status  03/23/2023 4.1 3.5 - 5.1 mmol/L Final  09/21/2014 3.5 mmol/L Final    Comment:    3.5-5.1 NOTE: New Reference Range  08/07/14    Sodium  Date Value Ref Range Status  03/23/2023 133 (L) 135 - 145 mmol/L Final  08/26/2021 140 134 - 144 mmol/L  Final  09/21/2014 139 mmol/L Final    Comment:    135-145 NOTE: New Reference Range  08/07/14    B Natriuretic Peptide  Date Value Ref Range Status  03/21/2023 1,050.7 (H) 0.0 - 100.0 pg/mL Final    Comment:    Performed at [facility], [address] [place], [place] [address]   Magnesium  Date Value Ref Range Status  03/22/2023 1.8 1.7 - 2.4 mg/dL Final    Comment:    Performed at [facility], [address] [place], [place] [address]  09/21/2014 1.8 mg/dL Final    Comment:    2.9-5.1 THERAPEUTIC RANGE: 4-7 mg/dL TOXIC: > 10 mg/dL  ----------------------- NOTE: New Reference Range  08/07/14    Hemoglobin A1C  Date Value Ref Range Status  03/28/2014 6.4 (H) 4.2 - 6.3 % Final    Comment:    The American Diabetes Association recommends that a primary goal of therapy should be <7% and that physicians should reevaluate the treatment regimen in patients with HbA1c values consistently >8%.    HB A1C (BAYER DCA - WAIVED)  Date Value Ref Range Status  07/30/2016 6.1 <7.0 % Final    Comment:                                          Diabetic  Adult            <7.0                                       Healthy Adult        4.3 - 5.7                                                           (DCCT/NGSP) American Diabetes Association's Summary of Glycemic Recommendations for Adults with Diabetes: Hemoglobin A1c <7.0%. More stringent glycemic goals (A1c <6.0%) may further reduce complications at the cost of increased risk of hypoglycemia.    Hgb A1c MFr Bld  Date Value Ref Range Status  03/22/2023 5.5 4.8 - 5.6 % Final    Comment:    (NOTE) Pre diabetes:          5.7%-6.4%  Diabetes:              >6.4%  Glycemic control for   <7.0% adults with diabetes    Digoxin Level  Date Value Ref Range Status  03/21/2023 <0.2 (L) 0.8 - 2.0 ng/mL Final    Comment:    RESULT CONFIRMED BY MANUAL DILUTION BGH Performed at [facility], [address] [place], [place] [address]    TSH  Date Value Ref Range Status  08/26/2021 1.600 0.450 - 4.500 uIU/mL Final   Vital Signs: Admission weight: Temp:  [97.6 F (36.4 C)-99 F (37.2 C)] 98 F (36.7 C) (10/22 1052) Pulse Rate:  [43-141] 126 (10/22 1052) Cardiac Rhythm: Atrial fibrillation (10/22 0700) Resp:  [15-32] 18 (10/22 1052) BP: (103-159)/(86-129) 151/129 (10/22 1052) SpO2:  [94 %-100 %] 100 % (10/22 1052) Weight:  [68 kg (149 lb 14.6 oz)] 68 kg (149 lb 14.6 oz) (10/22 0500)  Intake/Output Summary (Last 24 hours) at 03/23/2023 1131 Last data filed at 03/23/2023 0949 Gross per 24 hour  Intake --  Output 550 ml  Net -550 ml    Current Heart Failure Medications:  Loop diuretic: Furosemide 20 mg daily Beta-Blocker: Metoprolol succinate 100 mg BID ACEI/ARB/ARNI: none MRA: none SGLT2i: none Other: Digoxin 0.25 mg daily  Prior to admission Heart Failure Medications:  Loop diuretic: Furosemide 20 mg daily Beta-Blocker: Metoprolol succinate 100 mg BID ACEI/ARB/ARNI: Entresto 24-26 mg BID MRA: Spironolactone 12.5 mg daily SGLT2i: none Other: Digoxin 0.25 mg daily *Patient has not filled medications in ~2-3 months  Assessment: 1. 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 NYHA class III symptoms.  -Symptoms: Reports continued short of breath and fatigue, but extremely somnolent today most likely due to phenobarbital and benzodiazepines.  -Volume: BNP elevated on admission, however CXR showed no evidence of edema, urine output is minimal and urine color is dark. Creatinine slowly trending up. Suspect patient is likely euvolemic. Can consider transition back to oral diuretics. -Hemodynamics: Hypertensive in 180s while in room today, but BP has generally been 100-120s/80s. HR while in room was ~130. Currently on Eliquis. -BB: Home metoprolol succinate 100 mg BID resumed.  -ACEI/ARB/ARNI: Prescribed Entresto  PTA, can consider resuming now that BP is consistently elevated -MRA: Consider starting prior to discharge. -SGLT2i:Consider starting prior  to discharge. -Other: Digoxin level is low due to non-adherence.  -Will follow up cardiology plans surrounding AF.  Plan: 1) Medication changes recommended at this time: -Consider giving magnesium sulfate 2g IV to maintain Mg >2 and for rate control  -Given hypertension, can consider adding back prior to admission Entresto 24-26 mg BID.  2) Patient assistance: -Copays for Sherryll Burger, Farxiga, Jardiance, and Eliquis are $0  3) Education: - Patient has been educated on current HF medications and potential additions to HF medication regimen - Patient verbalizes understanding that over the next few months, these medication doses may change and more medications may be added to optimize HF regimen - Patient has been educated on basic disease state pathophysiology and goals of therapy  Medication Assistance / Insurance Benefits Check: Does the patient have prescription insurance?    Type of insurance plan:  Does the patient qualify for medication assistance through manufacturers or grants? Pending  Eligible grants and/or patient assistance programs: pending  Medication assistance applications in progress: pending  Medication assistance applications approved: pending Approved medication assistance renewals will be completed by: pending  Outpatient Pharmacy: Prior to admission outpatient pharmacy: [facility]     Please do not hesitate to reach out with questions or concerns,  Enos Fling, PharmD, CPP, BCPS Heart Failure Pharmacist  Phone - [phone] 03/23/2023 11:31 AM

## 2023-03-23 NOTE — Progress Notes (Signed)
MD Mansy made aware that Pt is very restless, HR in the 130-150's, and respirations in the 20-30's, O2 sats 100% on 2L [place]. Awaiting for provider to respond to message.

## 2023-03-24 DIAGNOSIS — I4891 Unspecified atrial fibrillation: Secondary | ICD-10-CM | POA: Diagnosis not present

## 2023-03-24 DIAGNOSIS — W19XXXA Unspecified fall, initial encounter: Secondary | ICD-10-CM | POA: Diagnosis not present

## 2023-03-24 DIAGNOSIS — I5022 Chronic systolic (congestive) heart failure: Secondary | ICD-10-CM | POA: Diagnosis not present

## 2023-03-24 DIAGNOSIS — F1010 Alcohol abuse, uncomplicated: Secondary | ICD-10-CM | POA: Diagnosis not present

## 2023-03-24 DIAGNOSIS — I739 Peripheral vascular disease, unspecified: Secondary | ICD-10-CM

## 2023-03-24 DIAGNOSIS — I5A Non-ischemic myocardial injury (non-traumatic): Secondary | ICD-10-CM | POA: Diagnosis not present

## 2023-03-24 DIAGNOSIS — E871 Hypo-osmolality and hyponatremia: Secondary | ICD-10-CM | POA: Diagnosis not present

## 2023-03-24 LAB — BASIC METABOLIC PANEL
Anion gap: 12 (ref 5–15)
BUN: 13 mg/dL (ref 8–23)
CO2: 25 mmol/L (ref 22–32)
Calcium: 8.6 mg/dL — ABNORMAL LOW (ref 8.9–10.3)
Chloride: 99 mmol/L (ref 98–111)
Creatinine, Ser: 1.21 mg/dL (ref 0.61–1.24)
GFR, Estimated: >60 mL/min (ref >60)
Glucose, Bld: 81 mg/dL (ref 70–99)
Potassium: 3.5 mmol/L (ref 3.5–5.1)
Sodium: 136 mmol/L (ref 135–145)

## 2023-03-24 LAB — DIGOXIN LEVEL: Digoxin Level: 1 ng/mL (ref 0.8–2.0)

## 2023-03-24 LAB — MAGNESIUM: Magnesium: 2 mg/dL (ref 1.7–2.4)

## 2023-03-24 MED ORDER — SPIRONOLACTONE 25 MG PO TABS
25.0000 mg | ORAL_TABLET | Freq: Every day | ORAL | Status: DC
  Administered 2023-03-24 – 2023-03-25 (×2): 25 mg via ORAL
  Filled 2023-03-24 (×2): qty 1

## 2023-03-24 MED ORDER — SACUBITRIL-VALSARTAN 24-26 MG PO TABS
1.0000 | ORAL_TABLET | Freq: Two times a day (BID) | ORAL | Status: DC
  Administered 2023-03-24 – 2023-03-26 (×4): 1 via ORAL
  Filled 2023-03-24 (×4): qty 1

## 2023-03-24 MED ORDER — FUROSEMIDE 10 MG/ML IJ SOLN
40.0000 mg | Freq: Two times a day (BID) | INTRAMUSCULAR | Status: DC
  Administered 2023-03-24: 40 mg via INTRAVENOUS
  Filled 2023-03-24 (×2): qty 4

## 2023-03-24 MED ORDER — POTASSIUM CHLORIDE CRYS ER 20 MEQ PO TBCR
40.0000 meq | EXTENDED_RELEASE_TABLET | Freq: Once | ORAL | Status: AC
  Administered 2023-03-24: 40 meq via ORAL
  Filled 2023-03-24: qty 2

## 2023-03-24 MED ORDER — METOPROLOL SUCCINATE ER 50 MG PO TB24
150.0000 mg | ORAL_TABLET | Freq: Two times a day (BID) | ORAL | Status: DC
  Administered 2023-03-24 – 2023-03-26 (×4): 150 mg via ORAL
  Filled 2023-03-24 (×4): qty 1

## 2023-03-24 NOTE — Progress Notes (Signed)
Cardiology Progress Note   Patient Name: Darren Rogers Date of Encounter: 03/24/2023  Primary Cardiologist: Lorine Bears, MD  Subjective   No chest pain. Says he's still sob.  Good response to lasix overnight. When discussing need for ambulation, he says that he needs a motorized scooter at home.   Objective   Inpatient Medications    Scheduled Meds:  apixaban  5 mg Oral BID   atorvastatin  40 mg Oral Daily   digoxin  0.125 mg Oral Daily   folic acid  1 mg Oral Daily   furosemide  40 mg Intravenous BID   LORazepam  0-4 mg Oral Q12H   metoprolol succinate  100 mg Oral BID   multivitamin with minerals  1 tablet Oral Daily   nicotine  21 mg Transdermal Daily   pantoprazole  40 mg Oral Daily   sacubitril-valsartan  1 tablet Oral BID   sodium chloride  1 g Oral BID WC   thiamine  100 mg Oral Daily   Or   thiamine  100 mg Intravenous Daily   Continuous Infusions:  PRN Meds: acetaminophen, haloperidol lactate, hydrALAZINE, nitroGLYCERIN, ondansetron (ZOFRAN) IV   Vital Signs    Vitals:   03/24/23 0500 03/24/23 0557 03/24/23 0830 03/24/23 1034  BP:  (!) 159/101 (!) 164/110 139/89  Pulse:  (!) 104  74  Resp:  20 (!) 28   Temp:   98.5 F (36.9 C)   TempSrc:   Oral   SpO2:   95%   Weight: 67.3 kg     Height:        Intake/Output Summary (Last 24 hours) at 03/24/2023 1121 Last data filed at 03/24/2023 1024 Gross per 24 hour  Intake --  Output 2550 ml  Net -2550 ml   Filed Weights   03/21/23 1739 03/23/23 0500 03/24/23 0500  Weight: 68 kg 68 kg 67.3 kg    Physical Exam   GEN: Well nourished, well developed, in no acute distress.  HEENT: Grossly normal.  Neck: Supple, no JVD, carotid bruits, or masses. Cardiac: IR, IR, tachy, no murmurs, rubs, or gallops. No clubbing, cyanosis, edema.  Radials 2+, DP/PT 2+ and equal bilaterally.  Respiratory:  Respirations regular and unlabored, scatt rhonchi. GI: Soft, nontender, nondistended, BS + x 4. MS: no deformity  or atrophy. Skin: warm and dry, no rash. Neuro:  Strength and sensation are intact. Psych: AAOx3.  Normal affect.  Labs    Chemistry Recent Labs  Lab 03/22/23 1801 03/23/23 0014 03/24/23 0426  NA 133* 133* 136  K 4.0 4.1 3.5  CL 101 100 99  CO2 21* 22 25  GLUCOSE 115* 127* 81  BUN 10 11 13   CREATININE 1.02 1.13 1.21  CALCIUM 8.8* 9.1 8.6*  GFRNONAA >60 >60 >60  ANIONGAP 11 11 12      Hematology Recent Labs  Lab 03/21/23 1742 03/22/23 1201  WBC 4.8 4.6  RBC 4.27 4.18*  HGB 13.8 13.6  HCT 40.3 39.2  MCV 94.4 93.8  MCH 32.3 32.5  MCHC 34.2 34.7  RDW 15.9* 15.9*  PLT 172 160    Cardiac Enzymes  Recent Labs  Lab 03/21/23 1742 03/21/23 2001  TROPONINIHS 24* 21*      BNP    Component Value Date/Time   BNP 1,050.7 (H) 03/21/2023 1742    ProBNP    Component Value Date/Time   PROBNP 145.5 (H) 01/27/2014 1705   Lipids  Lab Results  Component Value Date   CHOL 194 03/22/2023  HDL 72 03/22/2023   LDLCALC 106 (H) 03/22/2023   TRIG 80 03/22/2023   CHOLHDL 2.7 03/22/2023    HbA1c  Lab Results  Component Value Date   HGBA1C 5.5 03/22/2023    Radiology    DG Chest Port 1 View  Result Date: 03/23/2023 CLINICAL DATA:  Tachypnea and shortness of breath. EXAM: PORTABLE CHEST 1 VIEW COMPARISON:  12/20/2022 FINDINGS: Cardiac enlargement. Pulmonary vascular congestion with mild interstitial edema. No airspace consolidation. No pleural effusions identified. Visualized osseous structures appear intact. IMPRESSION: Cardiac enlargement, pulmonary vascular congestion and mild interstitial edema. Electronically Signed   By: Signa Kell M.D.   On: 03/23/2023 06:17   CT Head Wo Contrast  Result Date: 03/21/2023 CLINICAL DATA:  Recent fall EXAM: CT HEAD WITHOUT CONTRAST CT CERVICAL SPINE WITHOUT CONTRAST TECHNIQUE: Multidetector CT imaging of the head and cervical spine was performed following the standard protocol without intravenous contrast. Multiplanar CT  image reconstructions of the cervical spine were also generated. RADIATION DOSE REDUCTION: This exam was performed according to the departmental dose-optimization program which includes automated exposure control, adjustment of the mA and/or kV according to patient size and/or use of iterative reconstruction technique. COMPARISON:  None Available. FINDINGS: CT HEAD FINDINGS Brain: No evidence of acute infarction, hemorrhage, hydrocephalus, extra-axial collection or mass lesion/mass effect. Chronic atrophic and ischemic changes are noted. Mild basal ganglia calcifications seen. Vascular: No hyperdense vessel or unexpected calcification. Skull: Normal. Negative for fracture or focal lesion. Sinuses/Orbits: No acute finding. Other: None. CT CERVICAL SPINE FINDINGS Alignment: Loss of the normal cervical lordosis is noted. Skull base and vertebrae: 7 cervical segments are well visualized. Vertebral body height is well maintained. No acute fracture or acute facet abnormality is noted. Osteophytic change and facet hypertrophic changes are seen. Soft tissues and spinal canal: Surrounding soft tissue structures are within normal limits. No focal hematoma is seen. Upper chest: Visualized lung apices are within normal limits. Other: None IMPRESSION: CT of the head: No acute intracranial abnormality noted. CT of the cervical spine: Degenerative change without acute abnormality. Electronically Signed   By: Alcide Clever M.D.   On: 03/21/2023 21:27   CT Cervical Spine Wo Contrast  Result Date: 03/21/2023 CLINICAL DATA:  Recent fall EXAM: CT HEAD WITHOUT CONTRAST CT CERVICAL SPINE WITHOUT CONTRAST TECHNIQUE: Multidetector CT imaging of the head and cervical spine was performed following the standard protocol without intravenous contrast. Multiplanar CT image reconstructions of the cervical spine were also generated. RADIATION DOSE REDUCTION: This exam was performed according to the departmental dose-optimization program which  includes automated exposure control, adjustment of the mA and/or kV according to patient size and/or use of iterative reconstruction technique. COMPARISON:  None Available. FINDINGS: CT HEAD FINDINGS Brain: No evidence of acute infarction, hemorrhage, hydrocephalus, extra-axial collection or mass lesion/mass effect. Chronic atrophic and ischemic changes are noted. Mild basal ganglia calcifications seen. Vascular: No hyperdense vessel or unexpected calcification. Skull: Normal. Negative for fracture or focal lesion. Sinuses/Orbits: No acute finding. Other: None. CT CERVICAL SPINE FINDINGS Alignment: Loss of the normal cervical lordosis is noted. Skull base and vertebrae: 7 cervical segments are well visualized. Vertebral body height is well maintained. No acute fracture or acute facet abnormality is noted. Osteophytic change and facet hypertrophic changes are seen. Soft tissues and spinal canal: Surrounding soft tissue structures are within normal limits. No focal hematoma is seen. Upper chest: Visualized lung apices are within normal limits. Other: None IMPRESSION: CT of the head: No acute intracranial abnormality noted. CT of  the cervical spine: Degenerative change without acute abnormality. Electronically Signed   By: Alcide Clever M.D.   On: 03/21/2023 21:27   CT ABDOMEN PELVIS W CONTRAST  Result Date: 03/21/2023 CLINICAL DATA:  Abdominal pain, acute, nonlocalized a fall. Pt has been drinking tonight. No LOC, pt is on blood thinners but has not been compliant with his medications for a while. Pt c/o dizziness, CP before EMS arrived. EXAM: CT ABDOMEN AND PELVIS WITH CONTRAST TECHNIQUE: Multidetector CT imaging of the abdomen and pelvis was performed using the standard protocol following bolus administration of intravenous contrast. RADIATION DOSE REDUCTION: This exam was performed according to the departmental dose-optimization program which includes automated exposure control, adjustment of the mA and/or kV  according to patient size and/or use of iterative reconstruction technique. CONTRAST:  OMNIPAQUE IOHEXOL 300 MG/ML  SOLN COMPARISON:  CT abdomen pelvis 09/20/2022 FINDINGS: Lower chest: Enlarged heart.  No acute abnormality. Hepatobiliary: Not enlarged. Diffusely hypodense hepatic parenchyma. No focal lesion. No laceration or subcapsular hematoma. The gallbladder is otherwise unremarkable with no radio-opaque gallstones. No biliary ductal dilatation. Pancreas: Normal pancreatic contour. No main pancreatic duct dilatation. Spleen: Not enlarged. No focal lesion. No laceration, subcapsular hematoma, or vascular injury. Adrenals/Urinary Tract: No nodularity bilaterally. Bilateral kidneys enhance symmetrically. No hydronephrosis. No contusion, laceration, or subcapsular hematoma. No injury to the vascular structures or collecting systems. No hydroureter. The urinary bladder is decompressed. Stomach/Bowel: No small or large bowel wall thickening or dilatation. The appendix is unremarkable. Vasculature/Lymphatics: Severe atherosclerotic plaque with chronic occlusion of the right iliac arterial system as well as chronic severe narrowing of the left iliac arterial system. Marked diminished aortic caliber down to 1.1 x 0.9 cm. Reconstitution distally of a markedly diminished right external iliac artery and femoral artery noted. Findings appear to be stable. Arterial collaterals noted. No abdominal aorta or iliac aneurysm. No active contrast extravasation or pseudoaneurysm. No abdominal, pelvic, inguinal lymphadenopathy. Reproductive: Prostate is unremarkable. Other: No simple free fluid ascites. No pneumoperitoneum. No hemoperitoneum. No mesenteric hematoma identified. No organized fluid collection. Musculoskeletal: No significant soft tissue hematoma. No acute pelvic fracture. No spinal fracture. Severe degenerative changes of the L4-S1 levels. Associated intervertebral disc space vacuum phenomenon posterior disc  osteophyte complex formation. No associated severe osseous neural foraminal or central canal stenosis. Ports and Devices: None. IMPRESSION: 1.  No acute  intra-abdominal, intrapelvic traumatic injury. 2. No acute fracture or traumatic malalignment of the lumbar spine. Other imaging findings of potential clinical significance: 1. Cardiomegaly. 2. Aortic Atherosclerosis (ICD10-I70.0)-extensive and stable with chronic occlusion of the right iliac arterial system as well as chronic severe narrowing of the left iliac arterial system. Marked diminished abdominal aortic caliber down to 1.1 x 0.9 cm. Recommend outpatient vascular consultation. 3. Hepatic steatosis. Electronically Signed   By: Tish Frederickson M.D.   On: 03/21/2023 21:22     Telemetry    Afib, 90's to 120's - Personally Reviewed  Cardiac Studies   2D Echocardiogram 7.2024  1. Left ventricular ejection fraction, by estimation, is 35 to 40%. The  left ventricle has moderately decreased function. The left ventricle  demonstrates global hypokinesis. There is mild left ventricular  hypertrophy. Left ventricular diastolic  parameters are indeterminate.   2. Right ventricular systolic function is normal. The right ventricular  size is normal. There is moderately elevated pulmonary artery systolic  pressure. The estimated right ventricular systolic pressure is 53.4 mmHg.   3. Left atrial size was mildly dilated.   4. Right atrial size  was mildly dilated.   5. The mitral valve is normal in structure. Mild to moderate mitral valve  regurgitation. No evidence of mitral stenosis.   6. Tricuspid valve regurgitation is moderate.   7. The aortic valve is normal in structure. Aortic valve regurgitation is  mild. No aortic stenosis is present.   8. Pulmonic valve regurgitation is moderate.   9. Aortic dilatation noted. There is mild dilatation of the aortic root,  measuring 42 mm.  10. Mildly dilated pulmonary artery.  11. The inferior vena cava  is normal in size with <50% respiratory  variability, suggesting right atrial pressure of 8 mmHg. _____________   Cardiac Catheterization  7.2024  Diagnostic Dominance: Left   Patient Profile     67 y.o. male male with a history of chronic HFrEF, mixed ischemic and nonischemic cardiomyopathy, persistent atrial fibrillation coronary artery disease, hypertension, hyperlipidemia, polysubstance abuse, and medication nonadherence, who was admitted 10/20 w/ CHF and rapid afib - off of all meds.  Assessment & Plan    1.  Permanent atrial fibrillation with rapid ventricular response: Patient with a long history of A-fib complicated by noncompliance and thus an inability to pursue rhythm control.  He presented 10/20 w/ fatigue, dyspnea, and rapid afib - he's been off his meds for at least a month.  He was placed on amio in the ED.  We've d/c'd given lack of anticoagulation at home.  Prior home doses of ? blocker and digoxin have been resumed.  Rates, 90's to 120's.  Cont current doses. Digoxin level 1.0 this AM.  Hopefully rates improve w/ diuresis.  Rates may be at least partially driven by etoh w/d.  Cont eliquis.  2.  Acute on chronic HFrEF/mixed ICM/NICM:  EF 35-40% by echo in July 2024, at which time he was found to have an occluded LPAV, and otherwise moderate, nonobstructive CAD. Admitted w/ worsening dyspnea and volume overload.  Has responded well to IV lasix.  No intakes recorded.  Cumulative 3.8L out since admission.  Wt down 0.7 kg - 67.3 kg is lowest in past year.  BUN/Creat/Bicarb nl, but up slightly since admission.  Still w/ mild volume overload on exam and ongoing dyspnea.  Will escalate IV lasix to 40 BID today.  Suspect we'll be able to transition to PO tomorrow.  Cont ? blocker.  ARB switched to entresto this AM as pt insurance will cover.  Poor candidate for MRA due to h/o missing f/u appts/labs.  Consider SGLT2i, though worry that hygiene may be prohibitive.  3.  CAD/Demand Ischemia:   Known  LPAV occlusion with otherwise moderate, nonobstructive disease.  Denies chest pain.  Troponin minimally elevated with downtrend of 24  21.  Continue beta-blocker and statin.  No aspirin in the setting of Eliquis.   4.  HL:  Cont statin.  LDL 106 in setting of noncompliance.  5.  Polysubstance abuse:  Cont to drink/smoke.  Cessation advised.  Would benefit from outpt counseling and social work.  6.  PAD:  CT this admission w/ bilateral iliac dzs and severe narrowing of distal Ao.  Cont statin.  Needs outpt vascular surgery f/u.    7.  Medication nonadherence: As previously mentioned on prior admissions, this is the crux of most of his medical issues.  Stressed the importance of compliance.    Signed, Nicolasa Ducking, NP  03/24/2023, 11:21 AM    For questions or updates, please contact   Please consult [email or web address] for contact info under Cardiology/STEMI.

## 2023-03-24 NOTE — Progress Notes (Signed)
Progress Note   Patient: Darren Rogers ZOX:[id number] DOB: Jun 13, 1955 DOA: 03/21/2023     2 DOS: the patient was seen and examined on 03/24/2023   Brief hospital course: Darren Rogers is a 67 y.o. male with medical history significant of of significant of polysubstance use (cocaine, alcohol, tobacco), recurrent alcoholic pancreatitis, A fib on Eliquis and digoxin, HTN, HLD, depression, CAD, claudication, BPH, PVC, who presents with fall and heart racing.   Patient is admitted to hospitalist service for Afib with RVR.  10/23: increased metoprolol for better rate control, added spironolactone  Assessment and Plan: Atrial fibrillation with RVR He got IV beta blocker, IV digoxin load on admission Continue Digoxin, Eliquis Continue Metaprolol at higher dose now to control the rate better (as still running 110-120s at rest)   S/p fall at home No head injury. Monitor on telemetry PT, OT recommends SNF. TOC aware and working on placement Fall, aspiration precautions.  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 Will restart Losartan, Aldactone. Continue IV Lasix 40 BID (Increased from daily) Monitor daily weights, strict I/O. Net IO Since Admission: -4,134.86 mL [03/24/23 1924]  Added Spironolactone Continue Entresto  Elevated troponin- Demand ischemia in the setting of Afib. Troponin trended down. Cardiology following  Hyponatremia- Likely hypervolemic. Continue Lasix, aldactone. Sodium improving.  HLD (hyperlipidemia) Continue Lipitor   PAD (peripheral artery disease) (HCC): CT scan showed extensive and stable chronic occlusion of the right iliac arterial system as well as chronic severe narrowing of the left iliac arterial system. Marked diminished abdominal aortic caliber down to 1.1 x 0.9 cm.  Continue Lipitor and Eliquis. Outpatient vascular follow up.  Polysubstance abuse- Counseled     PT/ OT recommends SNF. TOC aware and working on  placement    Code Status: Full Code  Subjective: requesting to see if vascular dr would see him for right leg (he says, he was scheduled to see Dr Darren Rogers on Tuesday but he's here) so didn't make it. Feeling very weak, SOB and tired  Physical Exam: Vitals:   03/24/23 0830 03/24/23 1034 03/24/23 1329 03/24/23 1806  BP: (!) 164/110 139/89 120/88 116/89  Pulse:  74 99 (!) 106  Resp: (!) 28  (!) 22 (!) 24  Temp: 98.5 F (36.9 C)  97.6 F (36.4 C) 97.8 F (36.6 C)  TempSrc: Oral     SpO2: 95%  99% (!) 81%  Weight:      Height:        General - Elderly African American male, mild respiratory distress HEENT - PERRLA, EOMI, atraumatic head, non tender sinuses. Lung - basal rales Heart - S1, S2 heard, no murmurs, rubs, 1+ pedal edema. Abdomen - Soft, non tender, bowel sounds good Neuro - Alert, awake and oriented, non focal exam. Skin - Warm and dry.  Data Reviewed:      Latest Ref Rng & Units 03/22/2023   12:01 PM 03/21/2023    5:42 PM 12/23/2022   12:29 PM  CBC  WBC 4.0 - 10.5 K/uL 4.6  4.8    Hemoglobin 13.0 - 17.0 g/dL 81.1  91.4  78.2   Hematocrit 39.0 - 52.0 % 39.2  40.3  38.0   Platelets 150 - 400 K/uL 160  172        Latest Ref Rng & Units 03/24/2023    4:26 AM 03/23/2023   12:14 AM 03/22/2023    6:01 PM  BMP  Glucose 70 - 99 mg/dL 81  956  213  BUN 8 - 23 mg/dL 13  11  10    Creatinine 0.61 - 1.24 mg/dL 8.11  9.14  7.82   Sodium 135 - 145 mmol/L 136  133  133   Potassium 3.5 - 5.1 mmol/L 3.5  4.1  4.0   Chloride 98 - 111 mmol/L 99  100  101   CO2 22 - 32 mmol/L 25  22  21    Calcium 8.9 - 10.3 mg/dL 8.6  9.1  8.8    DG Chest Port 1 View  Result Date: 03/23/2023 CLINICAL DATA:  Tachypnea and shortness of breath. EXAM: PORTABLE CHEST 1 VIEW COMPARISON:  12/20/2022 FINDINGS: Cardiac enlargement. Pulmonary vascular congestion with mild interstitial edema. No airspace consolidation. No pleural effusions identified. Visualized osseous structures appear intact.  IMPRESSION: Cardiac enlargement, pulmonary vascular congestion and mild interstitial edema. Electronically Signed   By: Signa Kell M.D.   On: 03/23/2023 06:17     Disposition: Status is: Inpatient Remains inpatient appropriate because: Afib rate control. Possible D/C in 3-4 days depending on clinical condition and cardiac work up  Planned Discharge Destination: SNF     MDM level 3- Patient is admitted for afib with RVR, has multiple comorbid conditions including CHF, PAD. Has electrolyte abnormalities, need IV diuresis. He is at high risk for sudden clinical deterioration.  Author: Delfino Lovett, MD 03/24/2023 7:19 PM Secure chat 7am to 7pm For on call review [email or web address].

## 2023-03-24 NOTE — Progress Notes (Signed)
Physical Therapy Treatment Patient Details Name: Darren Rogers MRN: [id number] DOB: [birth year] Today's Date: 03/24/2023   History of Present Illness 67 y/o male presented to ED on 03/21/23 after fall and dizziness. Admitted for Afib with RVR. PMH: hx of Afib of Eliquis, HTN, CAD, recurrent alcoholic pancreatitis, polysubstance abuse    PT Comments  Pt received in bed, with encouragement pt agreed to PT session. Pt did not have O2 [place] on when received. Pt reports pain to be on a scale of 12/10 in their back and BLE. Pt performed bed mobility supine>sit EOB ModA for trunk and BLE management. While at EOB, pt expressed dizziness and BLE pain. While at EOB, Spo2 read 90% on RA, 2L of O2 [place] was applied. Pt demonstrated decreased ability to maintain seated balance without support. Pt performed fwd scoots MinA at EOB which increased dizziness and pain. Pt reports that they noticed with any movement dizziness and fatigue sets in. Pt performed STS with the use of RW (2wheels) MinA. Pt took lateral steps ModA towards HOB prior to sitting down due to fatigue/pain to end session in bed. Verbal and tactile cues necessary for trunk management while seated EOB in addition to cues when amb for posture reinforcement, RW management and lateral step sequencing. Pt educated on donning [place] and completed session on 1L of O2. Vitals reached 90% Spo2 on RA the lowest and peaked at 110 bpm the highest while seated EOB. Pt is making very limited progress towards goals, at this time. Increased assistance is necessary for D/C, communication occurred with TOC in secure chat regarding D/C disposition. Pt will continue to benefit from skilled PT sessions in order to improve endurance, strength, activity tolerance, and functional mobility.    If plan is discharge home, recommend the following: Assistance with cooking/housework;Help with stairs or ramp for entrance;Assist for transportation;A lot of help with walking and/or transfers;A lot  of help with bathing/dressing/bathroom   Can travel by private vehicle     No  Equipment Recommendations  Rolling walker (2 wheels)    Recommendations for Other Services       Precautions / Restrictions Precautions Precautions: Fall Precaution Comments: watch HR Restrictions Weight Bearing Restrictions: No     Mobility  Bed Mobility Overal bed mobility: Needs Assistance Bed Mobility: Supine to Sit, Sit to Supine     Supine to sit: Mod assist, HOB elevated Sit to supine: Mod assist   General bed mobility comments: Pt bed mobility mod for trunk and BLE management.    Transfers Overall transfer level: Needs assistance Equipment used: Rolling walker (2 wheels) Transfers: Sit to/from Stand Sit to Stand: Min assist           General transfer comment: Pt required VC when performing STS at EOB MinA with the use of RW (2wheels). VC necessary to assist in intiating transfer.    Ambulation/Gait Ambulation/Gait assistance: Min assist Gait Distance (Feet): 6 Feet Assistive device: Rolling walker (2 wheels) Gait Pattern/deviations: Step-to pattern, Antalgic Gait velocity: decreased     General Gait Details: Pt performed lateral steps at EOB with the use of RW (2wheels) ModA for RW and posture management. VC and tactile cues necessary to assist with lateral step sequencing.   Stairs             Wheelchair Mobility     Tilt Bed    Modified Rankin (Stroke Patients Only)       Balance Overall balance assessment: Needs assistance Sitting-balance support: Bilateral upper extremity supported,  Feet supported Sitting balance-Leahy Scale: Poor Sitting balance - Comments: required Min to Mod A to maintain static sitting balance Postural control: Posterior lean, Right lateral lean Standing balance support: Bilateral upper extremity supported, Reliant on assistive device for balance Standing balance-Leahy Scale: Poor Standing balance comment: Pt presents with a  forward flexed trunk when standing. Verbal and tactile cues necessary to adjust upright posture.                            Cognition Arousal: Alert Behavior During Therapy: WFL for tasks assessed/performed Overall Cognitive Status: No family/caregiver present to determine baseline cognitive functioning                                 General Comments: Pt pleasant and willing to participate in PT session.        Exercises      General Comments        Pertinent Vitals/Pain Pain Assessment Pain Assessment: 0-10 Pain Score: 10-Worst pain ever Pain Location: Back and BLE Pain Descriptors / Indicators: Aching, Constant, Sharp Pain Intervention(s): Monitored during session, Limited activity within patient's tolerance    Home Living                          Prior Function            PT Goals (current goals can now be found in the care plan section) Acute Rehab PT Goals Patient Stated Goal: to get better PT Goal Formulation: With patient Time For Goal Achievement: 04/05/23 Potential to Achieve Goals: Good Progress towards PT goals: Progressing toward goals    Frequency    Min 1X/week      PT Plan      Co-evaluation              AM-PAC PT "6 Clicks" Mobility   Outcome Measure  Help needed turning from your back to your side while in a flat bed without using bedrails?: A Lot Help needed moving from lying on your back to sitting on the side of a flat bed without using bedrails?: A Lot Help needed moving to and from a bed to a chair (including a wheelchair)?: A Little Help needed standing up from a chair using your arms (e.g., wheelchair or bedside chair)?: A Little Help needed to walk in hospital room?: A Lot Help needed climbing 3-5 steps with a railing? : A Lot 6 Click Score: 14    End of Session Equipment Utilized During Treatment: Gait belt Activity Tolerance: Patient limited by fatigue;Patient limited by  pain Patient left: in bed;with call bell/phone within reach;with bed alarm set Nurse Communication: Mobility status PT Visit Diagnosis: Unsteadiness on feet (R26.81);Muscle weakness (generalized) (M62.81);History of falling (Z91.81)     Time: 4132-4401 PT Time Calculation (min) (ACUTE ONLY): 36 min  Charges:    $Gait Training: 8-22 mins $Therapeutic Activity: 8-22 mins PT General Charges $$ ACUTE PT VISIT: 1 Visit                     Darren Rogers SPT, LAT, ATC    Tabor Bartram Sauvignon-Rogers 03/24/2023, 4:30 PM

## 2023-03-24 NOTE — Progress Notes (Signed)
Mobility Specialist - Progress Note     03/24/23 1034  Therapy Vitals  Pulse Rate 74  BP 139/89  Mobility  Activity Refused mobility   Pt declined mobility saying he was very tired but he did allow me to record vitals.   Darren Rogers Mobility Specialist 03/24/23, 10:35 AM

## 2023-03-24 NOTE — Progress Notes (Signed)
Heart Failure Stewardship Pharmacy Note  PCP: Pcp, No PCP-Cardiologist: Lorine Bears, MD  HPI: Darren Rogers is a 67 y.o. male with polysubstance use (cocaine, alcohol, tobacco), recurrent alcoholic pancreatitis, A fib on Eliquis and digoxin, HTN, HLD, depression, CAD, PAD with claudication, BPH, PVC who presented with heart racing, fall, and mild shortness of breath. Patient had been nonadherent to HF and AF medications including Eliquis prior to admission. CXR on admission with no edema noted.  Pertinent cardiac history: TTE in 05/2015 showed LEF 60-65%. Moderate blockage noted on arterial doppler in 06/2015. Stress test in 06/2015 showed T wave inversion in I, II, III, V4, V5, V6 during stress. A 2% PVC burden was noted 04/2017 via long term event monitor. TTE 07/2020 showed LVEF 50-55%. TTE 20/2022 showed LVEF 55-60% and low normal RV function. Event monitor 04/2021 showed 100% AF burden with rate of 99. TTE 11/2022 showed LVEF down to 35-40%, normal RV, mild-moderate MR, moderate TR, mild AR, and moderate PR. LHC 11/2022 showed severe single-vessel disease with CTO of Lcx/LPLA with left-to-left collaterals. During that time PVR was 5, PCWP was 18, and CI was 2.1.  Pertinent Lab Values: Creatinine  Date Value Ref Range Status  09/21/2014 1.10 mg/dL Final    Comment:    4.25-9.56 NOTE: New Reference Range  08/07/14    Creatinine, Ser  Date Value Ref Range Status  03/24/2023 1.21 0.61 - 1.24 mg/dL Final   BUN  Date Value Ref Range Status  03/24/2023 13 8 - 23 mg/dL Final  38/75/6433 9 8 - 27 mg/dL Final  29/51/8841 15 mg/dL Final    Comment:    6-60 NOTE: New Reference Range  08/07/14    Potassium  Date Value Ref Range Status  03/24/2023 3.5 3.5 - 5.1 mmol/L Final  09/21/2014 3.5 mmol/L Final    Comment:    3.5-5.1 NOTE: New Reference Range  08/07/14    Sodium  Date Value Ref Range Status  03/24/2023 136 135 - 145 mmol/L Final  08/26/2021 140 134 - 144 mmol/L Final   09/21/2014 139 mmol/L Final    Comment:    135-145 NOTE: New Reference Range  08/07/14    B Natriuretic Peptide  Date Value Ref Range Status  03/21/2023 1,050.7 (H) 0.0 - 100.0 pg/mL Final    Comment:    Performed at [facility], [address] [place], [place] [address]   Magnesium  Date Value Ref Range Status  03/24/2023 2.0 1.7 - 2.4 mg/dL Final    Comment:    Performed at [facility], [address] [place], [place] [address]  09/21/2014 1.8 mg/dL Final    Comment:    2.3-5.5 THERAPEUTIC RANGE: 4-7 mg/dL TOXIC: > 10 mg/dL  ----------------------- NOTE: New Reference Range  08/07/14    Hemoglobin A1C  Date Value Ref Range Status  03/28/2014 6.4 (H) 4.2 - 6.3 % Final    Comment:    The American Diabetes Association recommends that a primary goal of therapy should be <7% and that physicians should reevaluate the treatment regimen in patients with HbA1c values consistently >8%.    HB A1C (BAYER DCA - WAIVED)  Date Value Ref Range Status  07/30/2016 6.1 <7.0 % Final    Comment:                                          Diabetic Adult            <  7.0                                       Healthy Adult        4.3 - 5.7                                                           (DCCT/NGSP) American Diabetes Association's Summary of Glycemic Recommendations for Adults with Diabetes: Hemoglobin A1c <7.0%. More stringent glycemic goals (A1c <6.0%) may further reduce complications at the cost of increased risk of hypoglycemia.    Hgb A1c MFr Bld  Date Value Ref Range Status  03/22/2023 5.5 4.8 - 5.6 % Final    Comment:    (NOTE) Pre diabetes:          5.7%-6.4%  Diabetes:              >6.4%  Glycemic control for   <7.0% adults with diabetes    Digoxin Level  Date Value Ref Range Status  03/24/2023 1.0 0.8 - 2.0 ng/mL Final    Comment:    Performed at [facility], [address] [place], [place] [address]   TSH  Date  Value Ref Range Status  08/26/2021 1.600 0.450 - 4.500 uIU/mL Final   Vital Signs: Admission weight: ~150 lbs Temp:  [97.7 F (36.5 C)-98.5 F (36.9 C)] 98.5 F (36.9 C) (10/23 0830) Pulse Rate:  [58-126] 104 (10/23 0557) Cardiac Rhythm: Atrial fibrillation (10/23 0700) Resp:  [16-28] 28 (10/23 0830) BP: (151-170)/(101-130) 164/110 (10/23 0830) SpO2:  [95 %-100 %] 95 % (10/23 0830) Weight:  [67.3 kg (148 lb 5.9 oz)] 67.3 kg (148 lb 5.9 oz) (10/23 0500)  Intake/Output Summary (Last 24 hours) at 03/24/2023 1009 Last data filed at 03/23/2023 1934 Gross per 24 hour  Intake 0 ml  Output 2350 ml  Net -2350 ml   Current Heart Failure Medications:  Loop diuretic: Furosemide 40 IV mg daily Beta-Blocker: Metoprolol succinate 100 mg BID ACEI/ARB/ARNI: Entresto 24-26 mg BID MRA: none SGLT2i: none Other: Digoxin 0.125 mg daily  Prior to admission Heart Failure Medications:  Loop diuretic: Furosemide 20 mg daily Beta-Blocker: Metoprolol succinate 100 mg BID ACEI/ARB/ARNI: Entresto 24-26 mg BID MRA: Spironolactone 12.5 mg daily SGLT2i: none Other: Digoxin 0.25 mg daily *Patient has not filled medications in ~2-3 months  Assessment: 1. 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 NYHA class III symptoms.  -Symptoms: Reports continued short of breath and fatigue as well as body aches. Reports being cold with poor appetite. Given low output heart failure on cath 11/2022, this may be representative of low CI. CO2 on BMET improved from admission. Will monitor for now.  -Volume: BNP elevated on admission, however CXR showed no evidence of edema, urine output was initially minimal but has picked up considerably. Urine color in container is translucent orange, however new urine is clear. Creatinine slowly trending up. Continue IV diuresis today. -Hemodynamics: Hypertensive in 170s while in room today. This may be natural or the result of  alcohol withdrawal. HR while in room was ~100. Remains in AF, currently on Eliquis. -BB: Home metoprolol succinate 100 mg BID resumed.  -  ACEI/ARB/ARNI: Prescribed Entresto PTA, which should be resumed due to improved mortality benefit over ARBs. It was previously well tolerated and no cost. Concern poor adherence will continue. -MRA: Consider adding today to maintain normokalemia. Can start at lower dose. -SGLT2i: Consider starting prior to discharge.  -Other: Digoxin level is 1 after restarting inpatient. Agree with dose reduction. -Will follow up cardiology plans surrounding AF. Nonadherent to Eliquis prior to admission and not a good candidate for rhythm control at this time.  Plan: 1) Medication changes recommended at this time: -Consider changing losartan back to home Entresto 24-26 mg BID -Consider starting spironolactone 12.5 mg daily to maintain normokalemia  2) Patient assistance: -Copays for Entresto, Farxiga, Jardiance, and Eliquis are $0  3) Education: - Patient has been educated on current HF medications and potential additions to HF medication regimen - Patient verbalizes understanding that over the next few months, these medication doses may change and more medications may be added to optimize HF regimen - Patient has been educated on basic disease state pathophysiology and goals of therapy  Medication Assistance / Insurance Benefits Check: Does the patient have prescription insurance?    Type of insurance plan:  Does the patient qualify for medication assistance through manufacturers or grants? Pending  Eligible grants and/or patient assistance programs: pending  Medication assistance applications in progress: pending  Medication assistance applications approved: pending Approved medication assistance renewals will be completed by: pending  Outpatient Pharmacy: Prior to admission outpatient pharmacy: [facility]     Please do not hesitate to reach  out with questions or concerns,  Enos Fling, PharmD, CPP, BCPS Heart Failure Pharmacist  Phone - [phone] 03/24/2023 10:09 AM

## 2023-03-24 NOTE — TOC Initial Note (Signed)
Transition of Care [facility]) - Initial/Assessment Note    Patient Details  Name: Darren Rogers MRN: [id number] Date of Birth: 1955-11-14  Transition of [facility]) CM/SW Contact:    Darolyn Rua, LCSW Phone Number: 03/24/2023, 1:01 PM  Clinical Narrative:                  CSW met with patient at bedside to review home health recommendations, reports being in agreement no preference of agency. He does report no pcp as he has transportation issues, agreeable to [facility] referral for PCP follow up, CSW has lvm with Lawson Fiscal with [facility]. He confirms he does not wear O2 at home, currently on 2L nasal cannula. Patient reports he lives with his sister, states at discharge he may can identify family to transport depending on day/time.   NO additional needs identified per patient.   Expected Discharge Plan: Home w Home Health Services Barriers to Discharge: Continued Medical Work up   Patient Goals and CMS Choice Patient states their goals for this hospitalization and ongoing recovery are:: to go home CMS Medicare.gov Compare Post Acute Care list provided to:: Patient Choice offered to / list presented to : Patient      Expected Discharge Plan and Services                                              Prior Living Arrangements/Services                       Activities of Daily Living   ADL Screening (condition at time of admission) Independently performs ADLs?: Yes (appropriate for developmental age) Is the patient deaf or have difficulty hearing?: No Does the patient have difficulty seeing, even when wearing glasses/contacts?: No Does the patient have difficulty concentrating, remembering, or making decisions?: No  Permission Sought/Granted                  Emotional Assessment              Admission diagnosis:  Hyponatremia [E87.1] Atrial fibrillation with rapid ventricular response (HCC) [I48.91] Atrial fibrillation with RVR (HCC)  [I48.91] Fall, initial encounter [W19.XXXA] Patient Active Problem List   Diagnosis Date Noted   Chronic systolic CHF (congestive heart failure) (HCC) 03/22/2023   Myocardial injury 03/22/2023   Fall 03/22/2023   HLD (hyperlipidemia) 03/22/2023   Acute HFrEF (heart failure with reduced ejection fraction) (HCC) 12/23/2022   Elevated troponin 12/23/2022   Chronic atrial fibrillation with RVR (HCC) 08/06/2022   GERD without esophagitis 08/06/2022   Hyponatremia 08/06/2022   Hyperkalemia 08/06/2022   Atrial fibrillation, chronic (HCC) 05/19/2022   GERD (gastroesophageal reflux disease) 05/19/2022   AKI (acute kidney injury) (HCC) 03/10/2021   Lactic acidosis 03/10/2021   Acute encephalopathy 03/10/2021   Symptomatic bradycardia 03/09/2021   Abdominal pain, RUQ    Alcoholic pancreatitis 12/07/2020   Non-adherence to medical treatment 12/07/2020   Atrial fibrillation (HCC) 11/20/2020   Abscess of buccal space of mouth 11/19/2020   Dental abscess 11/19/2020   Facial cellulitis_left 11/19/2020   Cellulitis of face    Atrial fibrillation with RVR (HCC) 08/06/2020   Demand ischemia (HCC)    Polysubstance abuse (HCC)    Rapid atrial fibrillation (HCC) 08/05/2020   Alcohol use disorder, moderate, dependence (HCC) 08/05/2020   Cocaine abuse (HCC) 08/05/2020  Alcohol abuse 10/20/2019   Depression 10/20/2019   A-fib (HCC) 01/15/2017   Personal history of tobacco use, presenting hazards to health 08/17/2016   BPH associated with nocturia 07/30/2016   IFG (impaired fasting glucose) 07/30/2016   Other chest pain 08/22/2015   Essential hypertension    PAD (peripheral artery disease) (HCC)    Drug-induced erectile dysfunction 01/22/2015   CAD (coronary artery disease) 04/11/2014   CAD in native artery 04/11/2014   Paroxysmal atrial fibrillation (HCC)    NSTEMI (non-ST elevated myocardial infarction) (HCC) 01/29/2014   Tobacco abuse    Hyperlipidemia    Atrial fibrillation with rapid  ventricular response (HCC) 01/27/2014   PCP:  Oneita Hurt, No Pharmacy:   [facility] [address] [place] [address] Phone: [phone] Fax: [phone]     Social Determinants of Health (SDOH) Social History: SDOH Screenings   Food Insecurity: No Food Insecurity (03/22/2023)  Housing: Low Risk  (03/22/2023)  Transportation Needs: No Transportation Needs (03/22/2023)  Utilities: Not At Risk (03/22/2023)  Tobacco Use: High Risk (03/22/2023)   SDOH Interventions:     Readmission Risk Interventions    12/21/2022    9:13 AM 10/13/2020   11:28 AM  Readmission Risk Prevention Plan  Transportation Screening Complete Complete  PCP or Specialist Appt within 3-5 Days Complete --  HRI or Home Care Consult Complete   Social Work Consult for Recovery Care Planning/Counseling Complete   Palliative Care Screening Not Applicable Not Applicable  Medication Review Oceanographer) Referral to Pharmacy Complete

## 2023-03-25 DIAGNOSIS — W19XXXA Unspecified fall, initial encounter: Secondary | ICD-10-CM | POA: Diagnosis not present

## 2023-03-25 DIAGNOSIS — F1010 Alcohol abuse, uncomplicated: Secondary | ICD-10-CM | POA: Diagnosis not present

## 2023-03-25 DIAGNOSIS — E871 Hypo-osmolality and hyponatremia: Secondary | ICD-10-CM | POA: Diagnosis not present

## 2023-03-25 DIAGNOSIS — I5A Non-ischemic myocardial injury (non-traumatic): Secondary | ICD-10-CM | POA: Diagnosis not present

## 2023-03-25 DIAGNOSIS — E782 Mixed hyperlipidemia: Secondary | ICD-10-CM

## 2023-03-25 DIAGNOSIS — I4891 Unspecified atrial fibrillation: Secondary | ICD-10-CM | POA: Diagnosis not present

## 2023-03-25 DIAGNOSIS — I5022 Chronic systolic (congestive) heart failure: Secondary | ICD-10-CM | POA: Diagnosis not present

## 2023-03-25 LAB — CBC
HCT: 41 % (ref 39.0–52.0)
Hemoglobin: 13.9 g/dL (ref 13.0–17.0)
MCH: 31.7 pg (ref 26.0–34.0)
MCHC: 33.9 g/dL (ref 30.0–36.0)
MCV: 93.6 fL (ref 80.0–100.0)
Platelets: 157 10*3/uL (ref 150–400)
RBC: 4.38 MIL/uL (ref 4.22–5.81)
RDW: 15.1 % (ref 11.5–15.5)
WBC: 4.9 10*3/uL (ref 4.0–10.5)
nRBC: 0 % (ref 0.0–0.2)

## 2023-03-25 LAB — BASIC METABOLIC PANEL
Anion gap: 9 (ref 5–15)
BUN: 20 mg/dL (ref 8–23)
CO2: 28 mmol/L (ref 22–32)
Calcium: 8.6 mg/dL — ABNORMAL LOW (ref 8.9–10.3)
Chloride: 99 mmol/L (ref 98–111)
Creatinine, Ser: 1.07 mg/dL (ref 0.61–1.24)
GFR, Estimated: >60 mL/min (ref >60)
Glucose, Bld: 98 mg/dL (ref 70–99)
Potassium: 3.3 mmol/L — ABNORMAL LOW (ref 3.5–5.1)
Sodium: 136 mmol/L (ref 135–145)

## 2023-03-25 MED ORDER — FUROSEMIDE 40 MG PO TABS
40.0000 mg | ORAL_TABLET | Freq: Every day | ORAL | Status: DC
  Filled 2023-03-25: qty 1

## 2023-03-25 MED ORDER — ORAL CARE MOUTH RINSE
15.0000 mL | OROMUCOSAL | Status: DC | PRN
Start: 2023-03-25 — End: 2023-03-26

## 2023-03-25 MED ORDER — POTASSIUM CHLORIDE CRYS ER 20 MEQ PO TBCR
40.0000 meq | EXTENDED_RELEASE_TABLET | Freq: Once | ORAL | Status: AC
  Administered 2023-03-25: 40 meq via ORAL
  Filled 2023-03-25: qty 2

## 2023-03-25 MED ORDER — SPIRONOLACTONE 12.5 MG HALF TABLET
12.5000 mg | ORAL_TABLET | Freq: Every day | ORAL | Status: DC
  Administered 2023-03-26: 12.5 mg via ORAL
  Filled 2023-03-25: qty 1

## 2023-03-25 NOTE — Progress Notes (Signed)
Physical Therapy Treatment Patient Details Name: Darren Rogers MRN: [id number] DOB: [birth year] Today's Date: 03/25/2023   History of Present Illness 67 y/o male presented to ED on 03/21/23 after fall and dizziness. Admitted for Afib with RVR. PMH: hx of Afib of Eliquis, HTN, CAD, recurrent alcoholic pancreatitis, polysubstance abuse    PT Comments  Pt is received in bed, he is agreeable to PT session. Pt performs bed mobility close supA, transfers CGA x2, and amb minA. Pt able to perform STS without cuing and CGA x2 for safety; 1 occurrence of slight posterior lean during static standing using RW but able to correct with assist. Pt able to amb approx 6 ft using RW with cuing to reduce wide base of support; unable to increase amb distance at this time due to reports of RLE pain/discomfort. Overall, Pt demonstrates steady improvements towards PT goals and would benefit from cont skilled PT to address above deficits and promote optimal return to PLOF.    If plan is discharge home, recommend the following: Assistance with cooking/housework;Help with stairs or ramp for entrance;Assist for transportation;A lot of help with walking and/or transfers;A lot of help with bathing/dressing/bathroom   Can travel by private vehicle     No  Equipment Recommendations  Rolling walker (2 wheels)    Recommendations for Other Services       Precautions / Restrictions Precautions Precautions: Fall Precaution Comments: watch HR Restrictions Weight Bearing Restrictions: No     Mobility  Bed Mobility Overal bed mobility: Needs Assistance Bed Mobility: Supine to Sit, Sit to Supine     Supine to sit: Supervision Sit to supine: Contact guard assist   General bed mobility comments: Pt able to perform bed mobility: supine>sit close supA and sit>supine CGA for safety; no cuing required for hand placement    Transfers Overall transfer level: Needs assistance Equipment used: Rolling walker (2  wheels) Transfers: Sit to/from Stand Sit to Stand: Contact guard assist           General transfer comment: Pt able to perform STS using RW CGAx2 for safety but otherwise able to perform task mod I; no reports of dizziness or SOB    Ambulation/Gait Ambulation/Gait assistance: Min assist Gait Distance (Feet): 6 Feet Assistive device: Rolling walker (2 wheels) Gait Pattern/deviations: Step-to pattern, Decreased stride length, Wide base of support Gait velocity: Decreased     General Gait Details: Pt able to perform approx 6 ft using RW minA and cuing for maintaining RLE closer to trunk and reducing wide base of support; reports of RLE pain and self-limiting for amb distance   Stairs             Wheelchair Mobility     Tilt Bed    Modified Rankin (Stroke Patients Only)       Balance Overall balance assessment: Needs assistance Sitting-balance support: Feet supported Sitting balance-Leahy Scale: Good Sitting balance - Comments: Pt able to maintain seated static and dynamic EOB balance during functional activities Postural control: Posterior lean Standing balance support: Bilateral upper extremity supported, During functional activity Standing balance-Leahy Scale: Fair Standing balance comment: Pt able to maintain static standing balance using RW with heavy reliance on AD; slight minA for 1 occurrence of posterior lean but able to correct with assist                            Cognition Arousal: Alert Behavior During Therapy: [facility] for tasks assessed/performed Overall Cognitive  Status: Within Functional Limits for tasks assessed                                 General Comments: Pleasant and cooperative for PT session        Exercises      General Comments General comments (skin integrity, edema, etc.): SpO2% 90-97% on RA with reports of not using [place]; HR 84-89 throughout      Pertinent Vitals/Pain Pain Assessment Pain Assessment:  Faces Faces Pain Scale: Hurts a little bit Pain Location: RLE Pain Descriptors / Indicators: Aching, Sore, Discomfort Pain Intervention(s): Monitored during session, Limited activity within patient's tolerance    Home Living                          Prior Function            PT Goals (current goals can now be found in the care plan section) Acute Rehab PT Goals Patient Stated Goal: to get better PT Goal Formulation: With patient Time For Goal Achievement: 04/05/23 Potential to Achieve Goals: Good Progress towards PT goals: Progressing toward goals    Frequency    Min 1X/week      PT Plan      Co-evaluation              AM-PAC PT "6 Clicks" Mobility   Outcome Measure  Help needed turning from your back to your side while in a flat bed without using bedrails?: None Help needed moving from lying on your back to sitting on the side of a flat bed without using bedrails?: None Help needed moving to and from a bed to a chair (including a wheelchair)?: A Little Help needed standing up from a chair using your arms (e.g., wheelchair or bedside chair)?: A Little Help needed to walk in hospital room?: A Lot Help needed climbing 3-5 steps with a railing? : A Lot 6 Click Score: 18    End of Session Equipment Utilized During Treatment: Gait belt Activity Tolerance: Patient limited by pain Patient left: in bed;with call bell/phone within reach;with bed alarm set Nurse Communication: Mobility status PT Visit Diagnosis: Unsteadiness on feet (R26.81);Muscle weakness (generalized) (M62.81);History of falling (Z91.81)     Time: 1450-1502 PT Time Calculation (min) (ACUTE ONLY): 12 min  Charges:                            Elmon Else, SPT    Diksha Tagliaferro 03/25/2023, 3:13 PM

## 2023-03-25 NOTE — Progress Notes (Signed)
Heart Failure Stewardship Pharmacy Note  PCP: Pcp, No PCP-Cardiologist: Lorine Bears, MD  HPI: Darren Rogers is a 67 y.o. male with polysubstance use (cocaine, alcohol, tobacco), recurrent alcoholic pancreatitis, A fib on Eliquis and digoxin, HTN, HLD, depression, CAD, PAD with claudication, BPH, PVC who presented with heart racing, fall, and mild shortness of breath. Patient had been nonadherent to HF and AF medications including Eliquis prior to admission. CXR on admission with no edema noted.  Pertinent cardiac history: TTE in 05/2015 showed LEF 60-65%. Moderate blockage noted on arterial doppler in 06/2015. Stress test in 06/2015 showed T wave inversion in I, II, III, V4, V5, V6 during stress. A 2% PVC burden was noted 04/2017 via long term event monitor. TTE 07/2020 showed LVEF 50-55%. TTE 20/2022 showed LVEF 55-60% and low normal RV function. Event monitor 04/2021 showed 100% AF burden with rate of 99. TTE 11/2022 showed LVEF down to 35-40%, normal RV, mild-moderate MR, moderate TR, mild AR, and moderate PR. LHC 11/2022 showed severe single-vessel disease with CTO of Lcx/LPLA with left-to-left collaterals. During that time PVR was 5, PCWP was 18, and CI was 2.1.  Pertinent Lab Values: Creatinine  Date Value Ref Range Status  09/21/2014 1.10 mg/dL Final    Comment:    2.95-6.21 NOTE: New Reference Range  08/07/14    Creatinine, Ser  Date Value Ref Range Status  03/25/2023 1.07 0.61 - 1.24 mg/dL Final   BUN  Date Value Ref Range Status  03/25/2023 20 8 - 23 mg/dL Final  30/86/5784 9 8 - 27 mg/dL Final  69/62/9528 15 mg/dL Final    Comment:    4-13 NOTE: New Reference Range  08/07/14    Potassium  Date Value Ref Range Status  03/25/2023 3.3 (L) 3.5 - 5.1 mmol/L Final  09/21/2014 3.5 mmol/L Final    Comment:    3.5-5.1 NOTE: New Reference Range  08/07/14    Sodium  Date Value Ref Range Status  03/25/2023 136 135 - 145 mmol/L Final  08/26/2021 140 134 - 144 mmol/L  Final  09/21/2014 139 mmol/L Final    Comment:    135-145 NOTE: New Reference Range  08/07/14    B Natriuretic Peptide  Date Value Ref Range Status  03/21/2023 1,050.7 (H) 0.0 - 100.0 pg/mL Final    Comment:    Performed at [facility], [address] [place], [place] [address]   Magnesium  Date Value Ref Range Status  03/24/2023 2.0 1.7 - 2.4 mg/dL Final    Comment:    Performed at [facility], [address] [place], [place] [address]  09/21/2014 1.8 mg/dL Final    Comment:    3.6-6.4 THERAPEUTIC RANGE: 4-7 mg/dL TOXIC: > 10 mg/dL  ----------------------- NOTE: New Reference Range  08/07/14    Hemoglobin A1C  Date Value Ref Range Status  03/28/2014 6.4 (H) 4.2 - 6.3 % Final    Comment:    The American Diabetes Association recommends that a primary goal of therapy should be <7% and that physicians should reevaluate the treatment regimen in patients with HbA1c values consistently >8%.    HB A1C (BAYER DCA - WAIVED)  Date Value Ref Range Status  07/30/2016 6.1 <7.0 % Final    Comment:                                          Diabetic  Adult            <7.0                                       Healthy Adult        4.3 - 5.7                                                           (DCCT/NGSP) American Diabetes Association's Summary of Glycemic Recommendations for Adults with Diabetes: Hemoglobin A1c <7.0%. More stringent glycemic goals (A1c <6.0%) may further reduce complications at the cost of increased risk of hypoglycemia.    Hgb A1c MFr Bld  Date Value Ref Range Status  03/22/2023 5.5 4.8 - 5.6 % Final    Comment:    (NOTE) Pre diabetes:          5.7%-6.4%  Diabetes:              >6.4%  Glycemic control for   <7.0% adults with diabetes    Digoxin Level  Date Value Ref Range Status  03/24/2023 1.0 0.8 - 2.0 ng/mL Final    Comment:    Performed at [facility], [address] [place], [place] [address]   TSH   Date Value Ref Range Status  08/26/2021 1.600 0.450 - 4.500 uIU/mL Final   Vital Signs: Admission weight: ~150 lbs Temp:  [97.6 F (36.4 C)-98.1 F (36.7 C)] 97.9 F (36.6 C) (10/24 0922) Pulse Rate:  [68-116] 93 (10/24 0922) Cardiac Rhythm: Atrial fibrillation (10/24 0750) Resp:  [18-24] 20 (10/24 0922) BP: (84-149)/(69-99) 100/71 (10/24 0922) SpO2:  [81 %-100 %] 99 % (10/24 0922)  Intake/Output Summary (Last 24 hours) at 03/25/2023 1033 Last data filed at 03/25/2023 0700 Gross per 24 hour  Intake 399.64 ml  Output 700 ml  Net -300.36 ml   Current Heart Failure Medications:  Loop diuretic: Furosemide 40 IV mg daily Beta-Blocker: Metoprolol succinate 150 mg BID ACEI/ARB/ARNI: Entresto 24-26 mg BID MRA: Spironolactone 25 mg daily SGLT2i: none Other: Digoxin 0.125 mg daily  Prior to admission Heart Failure Medications:  Loop diuretic: Furosemide 20 mg daily Beta-Blocker: Metoprolol succinate 100 mg BID ACEI/ARB/ARNI: Entresto 24-26 mg BID MRA: Spironolactone 12.5 mg daily SGLT2i: none Other: Digoxin 0.25 mg daily *Patient has not filled medications in ~2-3 months  Assessment: 1. 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 NYHA class III symptoms.  -Symptoms: Patient asleep in room today and not easily arousable. On intermittent O2. -Volume: Creatinine improved with diuresis. Weight yesterday was trending down, no weight yet today. -Hemodynamics: BP 140s while in room today, however decreased to 100/71 on most recent reading. HR while in room was ~130. Remains in AF, currently on Eliquis. -BB: Metoprolol succinate increased to 150 mg BID given elevated rates.  -ACEI/ARB/ARNI: Prescribed Entresto PTA, which was resumed. -MRA: Creatinine ok after starting spironolactone. Potassium continues to deplete with diuresis. -SGLT2i: Consider starting prior to discharge.  -Other: Digoxin level was 1 after restarting  inpatient, dose reduced -Will follow up cardiology plans surrounding AF. Nonadherent to Eliquis prior to admission and not a good candidate for rhythm control at this time.  Plan: 1) Medication changes recommended at this time: -Consider supplementing with 40 meq of potassium chloride  2) Patient assistance: -Copays for Sherryll Burger, Farxiga, Jardiance, and Eliquis are $0  3) Education: - Patient has been educated on current HF medications and potential additions to HF medication regimen - Patient verbalizes understanding that over the next few months, these medication doses may change and more medications may be added to optimize HF regimen - Patient has been educated on basic disease state pathophysiology and goals of therapy  Medication Assistance / Insurance Benefits Check: Does the patient have prescription insurance?    Type of insurance plan:  Does the patient qualify for medication assistance through manufacturers or grants? Pending  Eligible grants and/or patient assistance programs: pending  Medication assistance applications in progress: pending  Medication assistance applications approved: pending Approved medication assistance renewals will be completed by: pending  Outpatient Pharmacy: Prior to admission outpatient pharmacy: [facility]     Please do not hesitate to reach out with questions or concerns,  Enos Fling, PharmD, CPP, BCPS Heart Failure Pharmacist  Phone - [phone] 03/25/2023 10:33 AM

## 2023-03-25 NOTE — NC FL2 (Signed)
MEDICAID FL2 LEVEL OF CARE FORM     IDENTIFICATION  Patient Name: Darren Rogers Birthdate: 1956-02-23 Sex: male Admission Date (Current Location): 03/21/2023  [employer] and [place] Number:  Chiropodist and Address:  [facility], [address], [place], [place] [address]      Provider Number: [id number]  Attending Physician Name and Address:  Delfino Lovett, MD  Relative Name and Phone Number:  Jari Sportsman  [phone]    Current Level of Care: Hospital Recommended Level of Care: Skilled Nursing Facility Prior Approval Number:    Date Approved/Denied:   PASRR Number: [id number]  Discharge Plan: SNF    Current Diagnoses: Patient Active Problem List   Diagnosis Date Noted   Chronic systolic CHF (congestive heart failure) (HCC) 03/22/2023   Myocardial injury 03/22/2023   Fall 03/22/2023   HLD (hyperlipidemia) 03/22/2023   Acute HFrEF (heart failure with reduced ejection fraction) (HCC) 12/23/2022   Elevated troponin 12/23/2022   Chronic atrial fibrillation with RVR (HCC) 08/06/2022   GERD without esophagitis 08/06/2022   Hyponatremia 08/06/2022   Hyperkalemia 08/06/2022   Atrial fibrillation, chronic (HCC) 05/19/2022   GERD (gastroesophageal reflux disease) 05/19/2022   AKI (acute kidney injury) (HCC) 03/10/2021   Lactic acidosis 03/10/2021   Acute encephalopathy 03/10/2021   Symptomatic bradycardia 03/09/2021   Abdominal pain, RUQ    Alcoholic pancreatitis 12/07/2020   Non-adherence to medical treatment 12/07/2020   Atrial fibrillation (HCC) 11/20/2020   Abscess of buccal space of mouth 11/19/2020   Dental abscess 11/19/2020   Facial cellulitis_left 11/19/2020   Cellulitis of face    Atrial fibrillation with RVR (HCC) 08/06/2020   Demand ischemia (HCC)    Polysubstance abuse (HCC)    Rapid atrial fibrillation (HCC) 08/05/2020   Alcohol use disorder, moderate, dependence (HCC) 08/05/2020   Cocaine abuse (HCC) 08/05/2020    Alcohol abuse 10/20/2019   Depression 10/20/2019   A-fib (HCC) 01/15/2017   Personal history of tobacco use, presenting hazards to health 08/17/2016   BPH associated with nocturia 07/30/2016   IFG (impaired fasting glucose) 07/30/2016   Other chest pain 08/22/2015   Essential hypertension    PAD (peripheral artery disease) (HCC)    Drug-induced erectile dysfunction 01/22/2015   CAD (coronary artery disease) 04/11/2014   CAD in native artery 04/11/2014   Paroxysmal atrial fibrillation (HCC)    NSTEMI (non-ST elevated myocardial infarction) (HCC) 01/29/2014   Tobacco abuse    Hyperlipidemia    Atrial fibrillation with rapid ventricular response (HCC) 01/27/2014    Orientation RESPIRATION BLADDER Height & Weight     Self, Time, Situation, Place  O2 (2L nasal cannula) Incontinent, External catheter Weight: 148 lb 5.9 oz (67.3 kg) Height:  5\' 6"  (167.6 cm)  BEHAVIORAL SYMPTOMS/MOOD NEUROLOGICAL BOWEL NUTRITION STATUS      Continent Diet (see discharge summary)  AMBULATORY STATUS COMMUNICATION OF NEEDS Skin   Extensive Assist Verbally Normal                       Personal Care Assistance Level of Assistance  Bathing, Feeding, Dressing, Total care Bathing Assistance: Maximum assistance Feeding assistance: Limited assistance Dressing Assistance: Maximum assistance Total Care Assistance: Maximum assistance   Functional Limitations Info  Sight, Hearing, Speech Sight Info: Adequate Hearing Info: Adequate Speech Info: Adequate    SPECIAL CARE FACTORS FREQUENCY  PT (By licensed PT), OT (By licensed OT)     PT Frequency: min 4x weekly OT Frequency: min 4x weekly  Contractures Contractures Info: Not present    Additional Factors Info  Code Status, Allergies Code Status Info: full Allergies Info: NKA           Current Medications (03/25/2023):  This is the current hospital active medication list Current Facility-Administered Medications  Medication  Dose Route Frequency Provider Last Rate Last Admin   acetaminophen (TYLENOL) tablet 650 mg  650 mg Oral Q6H PRN Lorretta Harp, MD   650 mg at 03/25/23 1610   apixaban (ELIQUIS) tablet 5 mg  5 mg Oral BID Lorretta Harp, MD   5 mg at 03/24/23 2043   atorvastatin (LIPITOR) tablet 40 mg  40 mg Oral Daily Lorretta Harp, MD   40 mg at 03/24/23 9604   digoxin (LANOXIN) tablet 0.125 mg  0.125 mg Oral Daily Marcelino Duster, MD   0.125 mg at 03/24/23 5409   folic acid (FOLVITE) tablet 1 mg  1 mg Oral Daily Ray, Neha, MD   1 mg at 03/24/23 8119   furosemide (LASIX) injection 40 mg  40 mg Intravenous BID Creig Hines, NP   40 mg at 03/24/23 1730   haloperidol lactate (HALDOL) injection 2 mg  2 mg Intramuscular Q6H PRN Mansy, Jan A, MD       hydrALAZINE (APRESOLINE) injection 5 mg  5 mg Intravenous Q2H PRN Lorretta Harp, MD       LORazepam (ATIVAN) tablet 0-4 mg  0-4 mg Oral Q12H Ray, Danie Binder, MD   1 mg at 03/24/23 2042   metoprolol succinate (TOPROL-XL) 24 hr tablet 150 mg  150 mg Oral BID Antonieta Iba, MD   150 mg at 03/24/23 2042   multivitamin with minerals tablet 1 tablet  1 tablet Oral Daily Trinna Post, MD   1 tablet at 03/24/23 1478   nicotine (NICODERM CQ - dosed in mg/24 hours) patch 21 mg  21 mg Transdermal Daily Lorretta Harp, MD   21 mg at 03/24/23 2956   nitroGLYCERIN (NITROSTAT) SL tablet 0.4 mg  0.4 mg Sublingual Q5 Min x 3 PRN Lorretta Harp, MD       ondansetron [facility]) injection 4 mg  4 mg Intravenous Q8H PRN Lorretta Harp, MD       pantoprazole (PROTONIX) EC tablet 40 mg  40 mg Oral Daily Lorretta Harp, MD   40 mg at 03/24/23 2130   sacubitril-valsartan (ENTRESTO) 24-26 mg per tablet  1 tablet Oral BID Creig Hines, NP   1 tablet at 03/24/23 2042   spironolactone (ALDACTONE) tablet 25 mg  25 mg Oral Daily Antonieta Iba, MD   25 mg at 03/24/23 1322   thiamine (VITAMIN B1) tablet 100 mg  100 mg Oral Daily Trinna Post, MD   100 mg at 03/24/23 8657   Or   thiamine (VITAMIN B1)  injection 100 mg  100 mg Intravenous Daily Trinna Post, MD         Discharge Medications: Please see discharge summary for a list of discharge medications.  Relevant Imaging Results:  Relevant Lab Results:   Additional Information SSN: [id number]  Darolyn Rua, LCSW

## 2023-03-25 NOTE — Progress Notes (Signed)
Progress Note   Patient: Darren Rogers QVZ:[id number] DOB: 1955-09-29 DOA: 03/21/2023     3 DOS: the patient was seen and examined on 03/25/2023   Brief hospital course: Darren Rogers is a 67 y.o. male with medical history significant of of significant of polysubstance use (cocaine, alcohol, tobacco), recurrent alcoholic pancreatitis, A fib on Eliquis and digoxin, HTN, HLD, depression, CAD, claudication, BPH, PVC, who presents with fall and heart racing.   Patient is admitted to hospitalist service for Afib with RVR.  10/23: increased metoprolol for better rate control, added spironolactone 10/24: PT and OT recommends SNF.  TOC working on placement.  Patient refused Lasix today  Assessment and Plan: Atrial fibrillation with RVR He got IV beta blocker, IV digoxin load on admission Continue Digoxin, Eliquis Continue Metaprolol for rate control   S/p fall at home No head injury. Monitor on telemetry PT, OT recommends SNF. TOC aware and working on placement Fall, aspiration precautions.  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 Continue losartan, Aldactone. Continue Lasix, Entresto Monitor daily weights, strict I/O. Net IO Since Admission: -3,815.22 mL [03/25/23 2142]   Elevated troponin- Demand ischemia in the setting of Afib. Troponin trended down. Cardiology following  Hyponatremia- Likely hypervolemic. Continue Lasix, aldactone. Sodium 136  Hypokalemia Replete and recheck  HLD (hyperlipidemia) Continue Lipitor   PAD (peripheral artery disease) (HCC): CT scan showed extensive and stable chronic occlusion of the right iliac arterial system as well as chronic severe narrowing of the left iliac arterial system. Marked diminished abdominal aortic caliber down to 1.1 x 0.9 cm.  Continue Lipitor and Eliquis. Outpatient vascular follow up with Dr. Wyn Quaker  Polysubstance abuse- Counseled     PT/ OT recommends SNF. TOC aware and working on placement.   Patient is now agreeable for SNF placement    Code Status: Full Code  Subjective: He refused Lasix today.  Early on he refused going to SNF but after discussion with him he is agreeable to go to SNF  Physical Exam: Vitals:   03/25/23 0922 03/25/23 1232 03/25/23 1626 03/25/23 1923  BP: 100/71 105/80 99/77 102/83  Pulse: 93 97 83 64  Resp: 20 20 18 18   Temp: 97.9 F (36.6 C) (!) 97.5 F (36.4 C) 97.7 F (36.5 C) 98.3 F (36.8 C)  TempSrc: Oral Oral Oral   SpO2: 99% 99% 95% 96%  Weight:      Height:        General - Elderly African American male, mild respiratory distress HEENT - PERRLA, EOMI, atraumatic head, non tender sinuses. Lung - basal rales Heart - S1, S2 heard, no murmurs, rubs, 1+ pedal edema. Abdomen - Soft, non tender, bowel sounds good Neuro - Alert, awake and oriented, non focal exam. Skin - Warm and dry.  Data Reviewed:      Latest Ref Rng & Units 03/25/2023    4:11 AM 03/22/2023   12:01 PM 03/21/2023    5:42 PM  CBC  WBC 4.0 - 10.5 K/uL 4.9  4.6  4.8   Hemoglobin 13.0 - 17.0 g/dL 32.9  51.8  84.1   Hematocrit 39.0 - 52.0 % 41.0  39.2  40.3   Platelets 150 - 400 K/uL 157  160  172       Latest Ref Rng & Units 03/25/2023    4:11 AM 03/24/2023    4:26 AM 03/23/2023   12:14 AM  BMP  Glucose 70 - 99 mg/dL 98  81  660  BUN 8 - 23 mg/dL 20  13  11    Creatinine 0.61 - 1.24 mg/dL 4.09  8.11  9.14   Sodium 135 - 145 mmol/L 136  136  133   Potassium 3.5 - 5.1 mmol/L 3.3  3.5  4.1   Chloride 98 - 111 mmol/L 99  99  100   CO2 22 - 32 mmol/L 28  25  22    Calcium 8.9 - 10.3 mg/dL 8.6  8.6  9.1    No results found.   Disposition: Status is: Inpatient Remains inpatient appropriate because: Afib rate control. Possible D/C in 1-2 days if he has SNF bed available  Planned Discharge Destination: SNF     MDM level 3- Patient is admitted for afib with RVR, has multiple comorbid conditions including CHF, PAD. Has electrolyte abnormalities, need IV  diuresis. He is at high risk for sudden clinical deterioration.  Author: Delfino Lovett, MD 03/25/2023 9:42 PM Secure chat 7am to 7pm For on call review [email or web address].

## 2023-03-25 NOTE — Progress Notes (Signed)
Rounding Note    Patient Name: Darren Rogers Date of Encounter: 03/25/2023  [facility] Cardiologist: Lorine Bears, MD   Subjective   Patient is overall feeling better. UOP -1.9L. Afib rates fairly controlled.   Inpatient Medications    Scheduled Meds:  apixaban  5 mg Oral BID   atorvastatin  40 mg Oral Daily   digoxin  0.125 mg Oral Daily   folic acid  1 mg Oral Daily   furosemide  40 mg Intravenous BID   LORazepam  0-4 mg Oral Q12H   metoprolol succinate  150 mg Oral BID   multivitamin with minerals  1 tablet Oral Daily   nicotine  21 mg Transdermal Daily   pantoprazole  40 mg Oral Daily   sacubitril-valsartan  1 tablet Oral BID   spironolactone  25 mg Oral Daily   thiamine  100 mg Oral Daily   Or   thiamine  100 mg Intravenous Daily   Continuous Infusions:  PRN Meds: acetaminophen, haloperidol lactate, hydrALAZINE, nitroGLYCERIN, ondansetron (ZOFRAN) IV   Vital Signs    Vitals:   03/24/23 2032 03/24/23 2340 03/25/23 0336 03/25/23 0922  BP: 119/83 132/82 (!) 149/99 100/71  Pulse: (!) 106 94 71 93  Resp: 18 18 18 20   Temp: 98.1 F (36.7 C) 98.1 F (36.7 C) 98.1 F (36.7 C) 97.9 F (36.6 C)  TempSrc:    Oral  SpO2: 97% 99% 100% 99%  Weight:      Height:        Intake/Output Summary (Last 24 hours) at 03/25/2023 1016 Last data filed at 03/25/2023 0700 Gross per 24 hour  Intake 519.64 ml  Output 1900 ml  Net -1380.36 ml      03/24/2023    5:00 AM 03/23/2023    5:00 AM 03/21/2023    5:39 PM  Last 3 Weights  Weight (lbs) 148 lb 5.9 oz 149 lb 14.6 oz 150 lb  Weight (kg) 67.3 kg 68 kg 68.04 kg      Telemetry    Afib 100-110, PVCs - Personally Reviewed  ECG    No new - Personally Reviewed  Physical Exam   GEN: No acute distress.   Neck: No JVD Cardiac: RRR, no murmurs, rubs, or gallops.  Respiratory: Clear to auscultation bilaterally. GI: Soft, nontender, non-distended  MS: No edema; No deformity. Neuro:  Nonfocal   Psych: Normal affect   Labs    High Sensitivity Troponin:   Recent Labs  Lab 03/21/23 1742 03/21/23 2001  TROPONINIHS 24* 21*     Chemistry Recent Labs  Lab 03/22/23 0600 03/22/23 1201 03/23/23 0014 03/24/23 0426 03/25/23 0411  NA 128*   < > 133* 136 136  K 3.9   < > 4.1 3.5 3.3*  CL 98   < > 100 99 99  CO2 19*   < > 22 25 28   GLUCOSE 103*   < > 127* 81 98  BUN 8   < > 11 13 20   CREATININE 0.95   < > 1.13 1.21 1.07  CALCIUM 8.3*   < > 9.1 8.6* 8.6*  MG 1.8  --   --  2.0  --   GFRNONAA >60   < > >60 >60 >60  ANIONGAP 11   < > 11 12 9    < > = values in this interval not displayed.    Lipids  Recent Labs  Lab 03/22/23 0600  CHOL 194  TRIG 80  HDL 72  LDLCALC  106*  CHOLHDL 2.7    Hematology Recent Labs  Lab 03/21/23 1742 03/22/23 1201 03/25/23 0411  WBC 4.8 4.6 4.9  RBC 4.27 4.18* 4.38  HGB 13.8 13.6 13.9  HCT 40.3 39.2 41.0  MCV 94.4 93.8 93.6  MCH 32.3 32.5 31.7  MCHC 34.2 34.7 33.9  RDW 15.9* 15.9* 15.1  PLT 172 160 157   Thyroid No results for input(s): "TSH", "FREET4" in the last 168 hours.  BNP Recent Labs  Lab 03/21/23 1742  BNP 1,050.7*    DDimer No results for input(s): "DDIMER" in the last 168 hours.   Radiology    No results found.  Cardiac Studies   R/L heart cath 11/2022 Conclusions: Severe single-vessel coronary artery disease with chronic total occlusion of distal LCx/LPLA.  LPDA is supplied by left-to-left collaterals.  There is mild-moderate, non-obstructive disease involving the LAD. Upper normal to mildly elevated left and right heart filling pressures (LVEDP 17 mmHg, PCWP 18 mmHg, RA 7 mmHg). Moderate pulmonary hypertension (mean PA 37 mmHg, PVR 5 WU) Mildly reduced Fick cardiac output/index (CO 3.8 L/min, CI 2.1 L/min/m^2).   Recommendations: Continue gentle diuresis. Increase metoprolol tartrate to 50 mg every 6 hours for improved rate control; transition to evidence-based beta blocker prior to discharge. Continue  digoxin. Increase losartan to 25 mg daily. Secondary prevention of coronary artery disease; there are no targets for revascularization.   Yvonne Kendall, MD [facility]    Echo 11/2022 1. Left ventricular ejection fraction, by estimation, is 35 to 40%. The  left ventricle has moderately decreased function. The left ventricle  demonstrates global hypokinesis. There is mild left ventricular  hypertrophy. Left ventricular diastolic  parameters are indeterminate.   2. Right ventricular systolic function is normal. The right ventricular  size is normal. There is moderately elevated pulmonary artery systolic  pressure. The estimated right ventricular systolic pressure is 53.4 mmHg.   3. Left atrial size was mildly dilated.   4. Right atrial size was mildly dilated.   5. The mitral valve is normal in structure. Mild to moderate mitral valve  regurgitation. No evidence of mitral stenosis.   6. Tricuspid valve regurgitation is moderate.   7. The aortic valve is normal in structure. Aortic valve regurgitation is  mild. No aortic stenosis is present.   8. Pulmonic valve regurgitation is moderate.   9. Aortic dilatation noted. There is mild dilatation of the aortic root,  measuring 42 mm.  10. Mildly dilated pulmonary artery.  11. The inferior vena cava is normal in size with <50% respiratory  variability, suggesting right atrial pressure of 8 mmHg.    Heart monitor 04/2021 Patch Wear Time:  14 days and 0 hours (2022-10-12T12:34:30-0400 to 2022-10-26T12:34:30-0400)   Atrial Fibrillation occurred continuously (100% burden), ranging from 52-217 bpm (avg of 99 bpm). Isolated VEs were rare (<1.0%, 12790), VE Couplets were rare (<1.0%, 113), and VE Triplets were rare (<1.0%, 10). Previously notified: MD notification    Echo 03/2021  1. Left ventricular ejection fraction, by estimation, is 55 to 60%. The  left ventricle has normal function. The left ventricle has no regional  wall motion  abnormalities. There is mild left ventricular hypertrophy.  Left ventricular diastolic function  could not be evaluated.   2. Right ventricular systolic function is low normal. The right  ventricular size is normal.   3. Left atrial size was mildly dilated.   4. The mitral valve is normal in structure. Mild mitral valve  regurgitation. No evidence of mitral  stenosis.   5. The aortic valve is tricuspid. There is mild thickening of the aortic  valve. Aortic valve regurgitation is trivial.   6. The inferior vena cava is normal in size with greater than 50%  respiratory variability, suggesting right atrial pressure of 3 mmHg.    MPI 07/2020 Narrative & Impression  Normal pharmacologic myocardial perfusion stress test without evidence of significant ischemia or scar. Grossly normal left ventricular systolic function by visual estimation. Calculated LVEF is unreliable due to gating issues related to atrial fibrillation. Coronary artery calcification and aortic atherosclerosis are noted on the attenuation correction CT. This is a low risk study.      Echo 07/2020 1. Left ventricular ejection fraction, by estimation, is 50 to 55%. The  left ventricle has low normal function. The left ventricle has no regional  wall motion abnormalities. There is mild left ventricular hypertrophy.  Left ventricular diastolic  parameters are indeterminate. The average left ventricular global  longitudinal strain is -8.7 %. The global longitudinal strain is abnormal.   2. Right ventricular systolic function is mildly reduced. The right  ventricular size is normal. Moderately increased right ventricular wall  thickness.   3. Left atrial size was mildly dilated.   4. The mitral valve is normal in structure. Mild mitral valve  regurgitation. No evidence of mitral stenosis.   5. The aortic valve is tricuspid. There is mild thickening of the aortic  valve. Aortic valve regurgitation is mild. Mild aortic valve  sclerosis is  present, with no evidence of aortic valve stenosis.   6. The inferior vena cava is normal in size with greater than 50%  respiratory variability, suggesting right atrial pressure of 3 mmHg.    Patient Profile     67 y.o. male with a h/o chronic HFrEF, mixed ICM/NICM, persistent Afib, CAD, HTN, HLD, polysubstance abuse and medication noncompliance who was admitted with CHF and rapid afib.  Assessment & Plan    Permanent Afib with RVR - long history of afib complicated by noncompliance - presented with fatigue, dyspnea and rapid afib - had been off his meds for at least a month - initially on amio in the ER - home BB and dig have been resumed - heavy alcohol use likely contributing - rates are reasonable  Acute on chronic HFrEF Mixed ICM/NICM - Echo in 11/2022 showed LVEF 35-40% - admitted with volume overload - IV lasix 40mg  BID - UOP -3.8L - Entresto 24-26mg BID, spiro 25mg  daily, toprol 150mg  daily - kidney function stable - PTA lasix 20mg  daily  CAD Demand ischemia - known LPAV occlusion with otherwise moderate nonobstructive CAD - no chest pain reported - HS trop trend flat and not consistent with ACS - continue BB and statin - NO ASA given Eliquis  HL - LDL 106 - continue statin  Polysubstance abuse - he continues to drink and smoke, cessation discussed   For questions or updates, please contact [facility] Please consult [email or web address] for contact info under        Signed, Emoni Yang David Stall, PA-C  03/25/2023, 10:16 AM

## 2023-03-25 NOTE — TOC Progression Note (Signed)
Transition of Care [facility]) - Progression Note    Patient Details  Name: Darren Rogers MRN: [id number] Date of Birth: 1956-03-10  Transition of Care [facility]) CM/SW Contact  Darolyn Rua, [place] Phone Number: 03/25/2023, 9:45 AM  Clinical Narrative:     Patient informed of PT recommendation change to [facility], patient is agreeable for referrals to be sent out to local facilities for bed offers.   Referrals sent, pending bed offers at this time.   Expected Discharge Plan: Home w Home Health Services Barriers to Discharge: Continued Medical Work up  Expected Discharge Plan and Services                                               Social Determinants of Health (SDOH) Interventions SDOH Screenings   Food Insecurity: No Food Insecurity (03/22/2023)  Housing: Low Risk  (03/22/2023)  Transportation Needs: No Transportation Needs (03/22/2023)  Utilities: Not At Risk (03/22/2023)  Tobacco Use: High Risk (03/22/2023)    Readmission Risk Interventions    12/21/2022    9:13 AM 10/13/2020   11:28 AM  Readmission Risk Prevention Plan  Transportation Screening Complete Complete  PCP or Specialist Appt within 3-5 Days Complete --  HRI or Home Care Consult Complete   Social Work Consult for Recovery Care Planning/Counseling Complete   Palliative Care Screening Not Applicable Not Applicable  Medication Review Oceanographer) Referral to Pharmacy Complete

## 2023-03-25 NOTE — Progress Notes (Signed)
OT Cancellation Note  Patient Details Name: Darren Rogers MRN: [id number] DOB: 08/25/1955   Cancelled Treatment:    Reason Eval/Treat Not Completed: Patient declined, no reason specified. Upon attempt, pt declines OT this morning reporting no interest in getting up out of the bed at this time despite encouragement from OT and RN present. Pt agreeable to OT re-attempting at later time.   Arman Filter., MPH, MS, OTR/L ascom [phone] 03/25/23, 10:34 AM

## 2023-03-25 NOTE — Plan of Care (Signed)

## 2023-03-26 ENCOUNTER — Encounter: Payer: 59 | Admitting: Cardiology

## 2023-03-26 ENCOUNTER — Other Ambulatory Visit: Payer: Self-pay

## 2023-03-26 DIAGNOSIS — I4891 Unspecified atrial fibrillation: Secondary | ICD-10-CM | POA: Diagnosis not present

## 2023-03-26 DIAGNOSIS — I10 Essential (primary) hypertension: Secondary | ICD-10-CM | POA: Diagnosis not present

## 2023-03-26 DIAGNOSIS — E871 Hypo-osmolality and hyponatremia: Secondary | ICD-10-CM | POA: Diagnosis not present

## 2023-03-26 DIAGNOSIS — I5023 Acute on chronic systolic (congestive) heart failure: Secondary | ICD-10-CM

## 2023-03-26 DIAGNOSIS — F1910 Other psychoactive substance abuse, uncomplicated: Secondary | ICD-10-CM | POA: Diagnosis not present

## 2023-03-26 DIAGNOSIS — E876 Hypokalemia: Secondary | ICD-10-CM

## 2023-03-26 LAB — CBC
HCT: 39.5 % (ref 39.0–52.0)
Hemoglobin: 14.2 g/dL (ref 13.0–17.0)
MCH: 32.6 pg (ref 26.0–34.0)
MCHC: 35.9 g/dL (ref 30.0–36.0)
MCV: 90.8 fL (ref 80.0–100.0)
Platelets: 173 10*3/uL (ref 150–400)
RBC: 4.35 MIL/uL (ref 4.22–5.81)
RDW: 14.9 % (ref 11.5–15.5)
WBC: 5.1 10*3/uL (ref 4.0–10.5)
nRBC: 0 % (ref 0.0–0.2)

## 2023-03-26 LAB — BASIC METABOLIC PANEL
Anion gap: 9 (ref 5–15)
BUN: 22 mg/dL (ref 8–23)
CO2: 26 mmol/L (ref 22–32)
Calcium: 8.9 mg/dL (ref 8.9–10.3)
Chloride: 102 mmol/L (ref 98–111)
Creatinine, Ser: 1 mg/dL (ref 0.61–1.24)
GFR, Estimated: >60 mL/min (ref >60)
Glucose, Bld: 95 mg/dL (ref 70–99)
Potassium: 4 mmol/L (ref 3.5–5.1)
Sodium: 137 mmol/L (ref 135–145)

## 2023-03-26 MED ORDER — FUROSEMIDE 20 MG PO TABS
40.0000 mg | ORAL_TABLET | Freq: Every day | ORAL | 3 refills | Status: DC | PRN
  Filled 2023-03-26 – 2023-04-02 (×2): qty 30, 15d supply, fill #0

## 2023-03-26 MED ORDER — SACUBITRIL-VALSARTAN 24-26 MG PO TABS
1.0000 | ORAL_TABLET | Freq: Two times a day (BID) | ORAL | 3 refills | Status: DC
  Filled 2023-03-26: qty 30, 15d supply, fill #0
  Filled 2023-04-02: qty 60, 30d supply, fill #0

## 2023-03-26 MED ORDER — NICOTINE 21 MG/24HR TD PT24
21.0000 mg | MEDICATED_PATCH | Freq: Every day | TRANSDERMAL | 0 refills | Status: AC
  Filled 2023-03-26 – 2023-04-02 (×2): qty 28, 28d supply, fill #0

## 2023-03-26 MED ORDER — DIGOXIN 125 MCG PO TABS
0.1250 mg | ORAL_TABLET | Freq: Every day | ORAL | 2 refills | Status: DC
  Filled 2023-03-26 – 2023-04-02 (×2): qty 30, 30d supply, fill #0

## 2023-03-26 MED ORDER — SPIRONOLACTONE 25 MG PO TABS
12.5000 mg | ORAL_TABLET | Freq: Every day | ORAL | 3 refills | Status: DC
  Filled 2023-03-26 – 2023-04-02 (×2): qty 30, 60d supply, fill #0

## 2023-03-26 MED ORDER — APIXABAN 5 MG PO TABS
5.0000 mg | ORAL_TABLET | Freq: Two times a day (BID) | ORAL | 3 refills | Status: DC
  Filled 2023-03-26 – 2023-04-02 (×2): qty 60, 30d supply, fill #0

## 2023-03-26 MED ORDER — METOPROLOL SUCCINATE ER 100 MG PO TB24
150.0000 mg | ORAL_TABLET | Freq: Two times a day (BID) | ORAL | 3 refills | Status: DC
  Filled 2023-03-26 – 2023-04-02 (×2): qty 60, 20d supply, fill #0

## 2023-03-26 NOTE — Discharge Summary (Addendum)
Physician Discharge Summary   Patient: Darren Rogers MRN: [id number] DOB: 1956-01-03  Admit date:     03/21/2023  Discharge date: 03/26/23  Discharge Physician: Marcelino Duster   PCP: Pcp, No   Recommendations at discharge:    PCP follow up in 1 week. Cardiology follow up as scheduled.  Discharge Diagnoses: Principal Problem:   Atrial fibrillation with RVR (HCC) Active Problems:   Chronic systolic CHF (congestive heart failure) (HCC)   Alcohol abuse   Myocardial injury   Essential hypertension   Hyponatremia   HLD (hyperlipidemia)   PAD (peripheral artery disease) (HCC)   Tobacco abuse   Cocaine abuse (HCC)   Polysubstance abuse (HCC)   Fall  Resolved Problems:   * No resolved hospital problems. [facility] Course: tobacco), recurrent alcoholic pancreatitis, A fib on Eliquis and digoxin, HTN, HLD, depression, CAD, claudication, BPH, PVC, who presents with fall and heart racing.    Patient is admitted to hospitalist service for Afib with RVR. Cardiology consulted. He is monitored on telemetry. Rate control meds adjusted per cardiology. He is diuresed well and now hemodynamically stable to be discharged home. Initial PT recommended SNF. He wishes to go home reevaluated by OT, today he is able to walk, can go home with [facility]. TOC aware of dc plan. I advised to follow up with PCP, Cardiology as instructed. He understands and agrees.  Assessment and Plan: Atrial fibrillation with RVR He got IV beta blocker, IV digoxin loaded on admission. Cardiology advised Diltiazem is not a good option with his reduced EF, oral amiodarone is contraindicated for management of permanent atrial fibrillation given its unfavorable side effect profile.  Continue Digoxin home dose, Metoprolol dose increased to 150 mg twice daily. His HR improved resting at 80 and while ambulating 115. Continue Eliquis. Advised to be compliant with medications. Advised to follow up with Cardiology as outpatient.  Scripts sent to pharmacy.    S/p fall at home No head injury. Monitor on telemetry PT, OT recommended SNF initially, today he worked with OT upgraded to home with [facility] services. TOC aware of HH services to be setup.   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 He got IV Lasix with good diuresis. Monitored daily weights, strict I/O. Net IO Since Admission: -3,815.22 mL [03/25/23 2142]  Currently euvolemic. Continue Lasix 40mg  daily PRN per cardiology team. Continue Entresto, Aldactone. Further GDMT dose adjustment as outpatient if BP tolerates. CHF education provided.   Elevated troponin- Demand ischemia in the setting of Afib. Troponin trended down. Cardiology on board, outpatient follow up.   Hyponatremia- Likely hypervolemic. Continue Lasix, aldactone. Sodium 137 today.   Hypokalemia Improved with oral repletion.   HLD (hyperlipidemia) Continue Lipitor   PAD (peripheral artery disease) (HCC): CT scan showed extensive and stable chronic occlusion of the right iliac arterial system as well as chronic severe narrowing of the left iliac arterial system. Marked diminished abdominal aortic caliber down to 1.1 x 0.9 cm.  Continue Lipitor and Eliquis. Outpatient vascular follow up with Dr. Wyn Quaker   Polysubstance abuse- Counseled extensively.       Consultants: Cardiology Procedures performed: none  Disposition: Home health Diet recommendation:  Discharge Diet Orders (From admission, onward)     Start     Ordered   03/26/23 0000  Diet - low sodium heart healthy        03/26/23 1600           Cardiac diet DISCHARGE MEDICATION: Allergies as of  03/26/2023   No Known Allergies      Medication List     TAKE these medications    apixaban 5 MG Tabs tablet Commonly known as: ELIQUIS Take 1 tablet (5 mg total) by mouth 2 (two) times daily.   aspirin EC 81 MG tablet Take 1 tablet (81 mg total) by mouth daily. Swallow whole.   atorvastatin  40 MG tablet Commonly known as: LIPITOR Take 1 tablet (40 mg total) by mouth daily.   digoxin 0.125 MG tablet Commonly known as: LANOXIN Take 1 tablet (0.125 mg total) by mouth daily. What changed: Another medication with the same name was removed. Continue taking this medication, and follow the directions you see here.   folic acid 1 MG tablet Commonly known as: FOLVITE Take 1 tablet (1 mg total) by mouth daily.   furosemide 20 MG tablet Commonly known as: LASIX Take 2 tablets (40 mg total) by mouth daily as needed for fluid or edema. Increase to 1 tablet (20 mg total) by mouth TWICE daily (total daily dose 40 mg) as needed for up to 3 days for increased leg swelling, shortness of breath, weight gain 5+ lbs over 1-2 days. Seek medical care if these symptoms are not improving with increased dose. What changed:  how much to take when to take this reasons to take this   metoprolol succinate 100 MG 24 hr tablet Commonly known as: TOPROL-XL Take 1.5 tablets (150 mg total) by mouth 2 (two) times daily. Take with or immediately following a meal. What changed: how much to take   multivitamin with minerals Tabs tablet Take 1 tablet by mouth daily.   nicotine 21 mg/24hr patch Commonly known as: NICODERM CQ - dosed in mg/24 hours Place 1 patch (21 mg total) onto the skin daily. Start taking on: March 27, 2023   nitroGLYCERIN 0.4 MG SL tablet Commonly known as: NITROSTAT Place 1 tablet (0.4 mg total) under the tongue every 5 (five) minutes x 3 doses as needed for chest pain.   pantoprazole 40 MG tablet Commonly known as: PROTONIX Take 1 tablet (40 mg total) by mouth daily.   polyethylene glycol 17 g packet Commonly known as: MIRALAX / GLYCOLAX Take 17 g by mouth daily as needed.   sacubitril-valsartan 24-26 MG Commonly known as: ENTRESTO Take 1 tablet by mouth 2 (two) times daily.   spironolactone 25 MG tablet Commonly known as: ALDACTONE Take 0.5 tablets (12.5 mg total) by  mouth daily.   thiamine 100 MG tablet Commonly known as: Vitamin B-1 Take 1 tablet (100 mg total) by mouth daily.        Follow-up Information     Iran Ouch, MD Follow up in 1 week(s).   Specialty: Cardiology Contact information: [address] [place] [address] [phone]                Discharge Exam: Ceasar Mons Weights   03/21/23 1739 03/23/23 0500 03/24/23 0500  Weight: 68 kg 68 kg 67.3 kg   General - Elderly African American male, mild respiratory distress HEENT - PERRLA, EOMI, atraumatic head, non tender sinuses. Lung - basal rales Heart - S1, S2 heard, no murmurs, rubs, 1+ pedal edema. Abdomen - Soft, non tender, bowel sounds good Neuro - Alert, awake and oriented, non focal exam. Skin - Warm and dry.  Condition at discharge: stable  The results of significant diagnostics from this hospitalization (including imaging, microbiology, ancillary and laboratory) are listed below for reference.  Imaging Studies: DG Chest Port 1 View  Result Date: 03/23/2023 CLINICAL DATA:  Tachypnea and shortness of breath. EXAM: PORTABLE CHEST 1 VIEW COMPARISON:  12/20/2022 FINDINGS: Cardiac enlargement. Pulmonary vascular congestion with mild interstitial edema. No airspace consolidation. No pleural effusions identified. Visualized osseous structures appear intact. IMPRESSION: Cardiac enlargement, pulmonary vascular congestion and mild interstitial edema. Electronically Signed   By: Signa Kell M.D.   On: 03/23/2023 06:17   CT Head Wo Contrast  Result Date: 03/21/2023 CLINICAL DATA:  Recent fall EXAM: CT HEAD WITHOUT CONTRAST CT CERVICAL SPINE WITHOUT CONTRAST TECHNIQUE: Multidetector CT imaging of the head and cervical spine was performed following the standard protocol without intravenous contrast. Multiplanar CT image reconstructions of the cervical spine were also generated. RADIATION DOSE REDUCTION: This exam was performed according to the  departmental dose-optimization program which includes automated exposure control, adjustment of the mA and/or kV according to patient size and/or use of iterative reconstruction technique. COMPARISON:  None Available. FINDINGS: CT HEAD FINDINGS Brain: No evidence of acute infarction, hemorrhage, hydrocephalus, extra-axial collection or mass lesion/mass effect. Chronic atrophic and ischemic changes are noted. Mild basal ganglia calcifications seen. Vascular: No hyperdense vessel or unexpected calcification. Skull: Normal. Negative for fracture or focal lesion. Sinuses/Orbits: No acute finding. Other: None. CT CERVICAL SPINE FINDINGS Alignment: Loss of the normal cervical lordosis is noted. Skull base and vertebrae: 7 cervical segments are well visualized. Vertebral body height is well maintained. No acute fracture or acute facet abnormality is noted. Osteophytic change and facet hypertrophic changes are seen. Soft tissues and spinal canal: Surrounding soft tissue structures are within normal limits. No focal hematoma is seen. Upper chest: Visualized lung apices are within normal limits. Other: None IMPRESSION: CT of the head: No acute intracranial abnormality noted. CT of the cervical spine: Degenerative change without acute abnormality. Electronically Signed   By: Alcide Clever M.D.   On: 03/21/2023 21:27   CT Cervical Spine Wo Contrast  Result Date: 03/21/2023 CLINICAL DATA:  Recent fall EXAM: CT HEAD WITHOUT CONTRAST CT CERVICAL SPINE WITHOUT CONTRAST TECHNIQUE: Multidetector CT imaging of the head and cervical spine was performed following the standard protocol without intravenous contrast. Multiplanar CT image reconstructions of the cervical spine were also generated. RADIATION DOSE REDUCTION: This exam was performed according to the departmental dose-optimization program which includes automated exposure control, adjustment of the mA and/or kV according to patient size and/or use of iterative reconstruction  technique. COMPARISON:  None Available. FINDINGS: CT HEAD FINDINGS Brain: No evidence of acute infarction, hemorrhage, hydrocephalus, extra-axial collection or mass lesion/mass effect. Chronic atrophic and ischemic changes are noted. Mild basal ganglia calcifications seen. Vascular: No hyperdense vessel or unexpected calcification. Skull: Normal. Negative for fracture or focal lesion. Sinuses/Orbits: No acute finding. Other: None. CT CERVICAL SPINE FINDINGS Alignment: Loss of the normal cervical lordosis is noted. Skull base and vertebrae: 7 cervical segments are well visualized. Vertebral body height is well maintained. No acute fracture or acute facet abnormality is noted. Osteophytic change and facet hypertrophic changes are seen. Soft tissues and spinal canal: Surrounding soft tissue structures are within normal limits. No focal hematoma is seen. Upper chest: Visualized lung apices are within normal limits. Other: None IMPRESSION: CT of the head: No acute intracranial abnormality noted. CT of the cervical spine: Degenerative change without acute abnormality. Electronically Signed   By: Alcide Clever M.D.   On: 03/21/2023 21:27   CT ABDOMEN PELVIS W CONTRAST  Result Date: 03/21/2023 CLINICAL DATA:  Abdominal pain, acute, nonlocalized  a fall. Pt has been drinking tonight. No LOC, pt is on blood thinners but has not been compliant with his medications for a while. Pt c/o dizziness, CP before EMS arrived. EXAM: CT ABDOMEN AND PELVIS WITH CONTRAST TECHNIQUE: Multidetector CT imaging of the abdomen and pelvis was performed using the standard protocol following bolus administration of intravenous contrast. RADIATION DOSE REDUCTION: This exam was performed according to the departmental dose-optimization program which includes automated exposure control, adjustment of the mA and/or kV according to patient size and/or use of iterative reconstruction technique. CONTRAST:  OMNIPAQUE IOHEXOL 300 MG/ML  SOLN  COMPARISON:  CT abdomen pelvis 09/20/2022 FINDINGS: Lower chest: Enlarged heart.  No acute abnormality. Hepatobiliary: Not enlarged. Diffusely hypodense hepatic parenchyma. No focal lesion. No laceration or subcapsular hematoma. The gallbladder is otherwise unremarkable with no radio-opaque gallstones. No biliary ductal dilatation. Pancreas: Normal pancreatic contour. No main pancreatic duct dilatation. Spleen: Not enlarged. No focal lesion. No laceration, subcapsular hematoma, or vascular injury. Adrenals/Urinary Tract: No nodularity bilaterally. Bilateral kidneys enhance symmetrically. No hydronephrosis. No contusion, laceration, or subcapsular hematoma. No injury to the vascular structures or collecting systems. No hydroureter. The urinary bladder is decompressed. Stomach/Bowel: No small or large bowel wall thickening or dilatation. The appendix is unremarkable. Vasculature/Lymphatics: Severe atherosclerotic plaque with chronic occlusion of the right iliac arterial system as well as chronic severe narrowing of the left iliac arterial system. Marked diminished aortic caliber down to 1.1 x 0.9 cm. Reconstitution distally of a markedly diminished right external iliac artery and femoral artery noted. Findings appear to be stable. Arterial collaterals noted. No abdominal aorta or iliac aneurysm. No active contrast extravasation or pseudoaneurysm. No abdominal, pelvic, inguinal lymphadenopathy. Reproductive: Prostate is unremarkable. Other: No simple free fluid ascites. No pneumoperitoneum. No hemoperitoneum. No mesenteric hematoma identified. No organized fluid collection. Musculoskeletal: No significant soft tissue hematoma. No acute pelvic fracture. No spinal fracture. Severe degenerative changes of the L4-S1 levels. Associated intervertebral disc space vacuum phenomenon posterior disc osteophyte complex formation. No associated severe osseous neural foraminal or central canal stenosis. Ports and Devices: None.  IMPRESSION: 1.  No acute  intra-abdominal, intrapelvic traumatic injury. 2. No acute fracture or traumatic malalignment of the lumbar spine. Other imaging findings of potential clinical significance: 1. Cardiomegaly. 2. Aortic Atherosclerosis (ICD10-I70.0)-extensive and stable with chronic occlusion of the right iliac arterial system as well as chronic severe narrowing of the left iliac arterial system. Marked diminished abdominal aortic caliber down to 1.1 x 0.9 cm. Recommend outpatient vascular consultation. 3. Hepatic steatosis. Electronically Signed   By: Tish Frederickson M.D.   On: 03/21/2023 21:22    Microbiology: Results for orders placed or performed during the hospital encounter of 05/19/22  Resp panel by RT-PCR (RSV, Flu A&B, Covid) Anterior Nasal Swab     Status: None   Collection Time: 05/23/22 11:23 AM   Specimen: Anterior Nasal Swab  Result Value Ref Range Status   SARS Coronavirus 2 by RT PCR NEGATIVE NEGATIVE Final    Comment: (NOTE) SARS-CoV-2 target nucleic acids are NOT DETECTED.  The SARS-CoV-2 RNA is generally detectable in upper respiratory specimens during the acute phase of infection. The lowest concentration of SARS-CoV-2 viral copies this assay can detect is 138 copies/mL. A negative result does not preclude SARS-Cov-2 infection and should not be used as the sole basis for treatment or other patient management decisions. A negative result may occur with  improper specimen collection/handling, submission of specimen other than nasopharyngeal swab, presence of viral mutation(s) within  the areas targeted by this assay, and inadequate number of viral copies(<138 copies/mL). A negative result must be combined with clinical observations, patient history, and epidemiological information. The expected result is Negative.  Fact Sheet for Patients:  BloggerCourse.com  Fact Sheet for Healthcare Providers:   SeriousBroker.it  This test is no t yet approved or cleared by the [place] FDA and  has been authorized for detection and/or diagnosis of SARS-CoV-2 by FDA under an Emergency Use Authorization (EUA). This EUA will remain  in effect (meaning this test can be used) for the duration of the COVID-19 declaration under Section 564(b)(1) of the Act, 21 U.S.C.section 360bbb-3(b)(1), unless the authorization is terminated  or revoked sooner.       Influenza A by PCR NEGATIVE NEGATIVE Final   Influenza B by PCR NEGATIVE NEGATIVE Final    Comment: (NOTE) The Xpert Xpress SARS-CoV-2/FLU/RSV plus assay is intended as an aid in the diagnosis of influenza from Nasopharyngeal swab specimens and should not be used as a sole basis for treatment. Nasal washings and aspirates are unacceptable for Xpert Xpress SARS-CoV-2/FLU/RSV testing.  Fact Sheet for Patients: BloggerCourse.com  Fact Sheet for Healthcare Providers: SeriousBroker.it  This test is not yet approved or cleared by the [place] FDA and has been authorized for detection and/or diagnosis of SARS-CoV-2 by FDA under an Emergency Use Authorization (EUA). This EUA will remain in effect (meaning this test can be used) for the duration of the COVID-19 declaration under Section 564(b)(1) of the Act, 21 U.S.C. section 360bbb-3(b)(1), unless the authorization is terminated or revoked.     Resp Syncytial Virus by PCR NEGATIVE NEGATIVE Final    Comment: (NOTE) Fact Sheet for Patients: BloggerCourse.com  Fact Sheet for Healthcare Providers: SeriousBroker.it  This test is not yet approved or cleared by the [place] FDA and has been authorized for detection and/or diagnosis of SARS-CoV-2 by FDA under an Emergency Use Authorization (EUA). This EUA will remain in effect (meaning this test can be used) for  the duration of the COVID-19 declaration under Section 564(b)(1) of the Act, 21 U.S.C. section 360bbb-3(b)(1), unless the authorization is terminated or revoked.  Performed at [facility], [address] [place], [place] [address]     Labs: CBC: Recent Labs  Lab 03/21/23 1742 03/22/23 1201 03/25/23 0411 03/26/23 0431  WBC 4.8 4.6 4.9 5.1  HGB 13.8 13.6 13.9 14.2  HCT 40.3 39.2 41.0 39.5  MCV 94.4 93.8 93.6 90.8  PLT 172 160 157 173   Basic Metabolic Panel: Recent Labs  Lab 03/22/23 0600 03/22/23 1201 03/22/23 1801 03/23/23 0014 03/24/23 0426 03/25/23 0411 03/26/23 0431  NA 128*   < > 133* 133* 136 136 137  K 3.9   < > 4.0 4.1 3.5 3.3* 4.0  CL 98   < > 101 100 99 99 102  CO2 19*   < > 21* 22 25 28 26   GLUCOSE 103*   < > 115* 127* 81 98 95  BUN 8   < > 10 11 13 20 22   CREATININE 0.95   < > 1.02 1.13 1.21 1.07 1.00  CALCIUM 8.3*   < > 8.8* 9.1 8.6* 8.6* 8.9  MG 1.8  --   --   --  2.0  --   --   PHOS 3.9  --   --   --   --   --   --    < > = values in this interval not displayed.  Liver Function Tests: No results for input(s): "AST", "ALT", "ALKPHOS", "BILITOT", "PROT", "ALBUMIN" in the last 168 hours. CBG: No results for input(s): "GLUCAP" in the last 168 hours.  Discharge time spent: 38 minutes.  Signed: Marcelino Duster, MD [facility] 03/26/2023

## 2023-03-26 NOTE — Plan of Care (Signed)
  Problem: Education: Goal: Knowledge of General Education information will improve Description: Including pain rating scale, medication(s)/side effects and non-pharmacologic comfort measures Outcome: Progressing   Problem: Health Behavior/Discharge Planning: Goal: Ability to manage health-related needs will improve Outcome: Progressing   Problem: Clinical Measurements: Goal: Will remain free from infection Outcome: Progressing   Problem: Nutrition: Goal: Adequate nutrition will be maintained Outcome: Progressing   Problem: Safety: Goal: Ability to remain free from injury will improve Outcome: Progressing   

## 2023-03-26 NOTE — TOC Transition Note (Signed)
Transition of Care [facility]) - CM/SW Discharge Note   Patient Details  Name: Darren Rogers MRN: [id number] Date of Birth: 07/09/1955  Transition of [facility] CM/SW Contact:  Chapman Fitch, RN Phone Number: 03/26/2023, 4:29 PM   Clinical Narrative:     Notified by MD that patient will be discharing home today with home health services  Spoke with patient and he confirms this is the plan and states he does not have a preference of home health agency. Referral made to [facility] with [facility] Patient states he already has a RW and BSC in the home  Patient does not have a ride home today. Patient ambulated 100 feet today with OT.  Per bedside RN and patient, he is appropriate for cab  Cab voucher provided and bed side RN to get rider waiver sign and place in chart     Barriers to Discharge: Continued Medical Work up   Patient Goals and CMS Choice CMS Medicare.gov Compare Post Acute Care list provided to:: Patient Choice offered to / list presented to : Patient  Discharge Placement                         Discharge Plan and Services Additional resources added to the After Visit Summary for                                       Social Determinants of Health (SDOH) Interventions SDOH Screenings   Food Insecurity: No Food Insecurity (03/22/2023)  Housing: Low Risk  (03/22/2023)  Transportation Needs: No Transportation Needs (03/22/2023)  Utilities: Not At Risk (03/22/2023)  Tobacco Use: High Risk (03/22/2023)     Readmission Risk Interventions    12/21/2022    9:13 AM 10/13/2020   11:28 AM  Readmission Risk Prevention Plan  Transportation Screening Complete Complete  PCP or Specialist Appt within 3-5 Days Complete --  HRI or Home Care Consult Complete   Social Work Consult for Recovery Care Planning/Counseling Complete   Palliative Care Screening Not Applicable Not Applicable  Medication Review Oceanographer) Referral to Pharmacy Complete

## 2023-03-26 NOTE — Progress Notes (Signed)
Occupational Therapy Treatment Patient Details Name: Darren Rogers MRN: [id number] DOB: [birth year] Today's Date: 03/26/2023   History of present illness 67 y/o male presented to ED on 03/21/23 after fall and dizziness. Admitted for Afib with RVR. PMH: hx of Afib of Eliquis, HTN, CAD, recurrent alcoholic pancreatitis, polysubstance abuse   OT comments  Mr Falardeau was seen for OT treatment on this date. Upon arrival to room pt reclined in bed, agreeable to tx. Pt requires SBA for ADL t/f ~100 ft and bed making task, no AD use. MOD I don B socks in sitting. Tolerated dynamic standing balance tasks with no LOBs reaching over head, reaching to below knee height, and single leg stance. Pt making good progress toward goals, will continue to follow POC. Discharge recommendation updated to reflect pt progress. Recommend 3-in-1 commode for safety at home.       If plan is discharge home, recommend the following:  A little help with walking and/or transfers;A little help with bathing/dressing/bathroom;Help with stairs or ramp for entrance   Equipment Recommendations  BSC/3in1    Recommendations for Other Services      Precautions / Restrictions Precautions Precautions: Fall Precaution Comments: watch HR Restrictions Weight Bearing Restrictions: No       Mobility Bed Mobility Overal bed mobility: Independent                  Transfers Overall transfer level: Needs assistance Equipment used: None Transfers: Sit to/from Stand Sit to Stand: Supervision                 Balance Overall balance assessment: Needs assistance Sitting-balance support: Feet supported Sitting balance-Leahy Scale: Normal     Standing balance support: No upper extremity supported, During functional activity Standing balance-Leahy Scale: Good                             ADL either performed or assessed with clinical judgement   ADL Overall ADL's : Needs assistance/impaired                                        General ADL Comments: SBA for ADL t/f ~100 ft and bed making task, no AD use. MOD I don B socks in sitting. Tolerated dynamic standing balance tasks with no LOBs reaching over head and single leg stance      Cognition Arousal: Alert Behavior During Therapy: WFL for tasks assessed/performed Overall Cognitive Status: Within Functional Limits for tasks assessed                                                     Pertinent Vitals/ Pain       Pain Assessment Pain Assessment: No/denies pain   Frequency  Min 1X/week        Progress Toward Goals  OT Goals(current goals can now be found in the care plan section)  Progress towards OT goals: Progressing toward goals  Acute Rehab OT Goals Patient Stated Goal: to go home OT Goal Formulation: With patient Time For Goal Achievement: 04/05/23 Potential to Achieve Goals: Fair ADL Goals Pt Will Perform Grooming: standing;with modified independence Pt Will Transfer to Toilet: with modified independence;regular height toilet;ambulating Pt Will  Perform Toileting - Clothing Manipulation and hygiene: with modified independence;sit to/from stand  Plan      Co-evaluation                 AM-PAC OT "6 Clicks" Daily Activity     Outcome Measure   Help from another person eating meals?: None Help from another person taking care of personal grooming?: A Little Help from another person toileting, which includes using toliet, bedpan, or urinal?: A Little Help from another person bathing (including washing, rinsing, drying)?: A Little Help from another person to put on and taking off regular upper body clothing?: None Help from another person to put on and taking off regular lower body clothing?: None 6 Click Score: 21    End of Session    OT Visit Diagnosis: Unsteadiness on feet (R26.81);History of falling (Z91.81)   Activity Tolerance Patient tolerated treatment  well   Patient Left in bed;with call bell/phone within reach   Nurse Communication Mobility status        Time: 4540-9811 OT Time Calculation (min): 10 min  Charges: OT General Charges $OT Visit: 1 Visit OT Treatments $Self Care/Home Management : 8-22 mins  Kathie Dike, M.S. OTR/L  03/26/23, 3:36 PM  ascom [phone]

## 2023-03-26 NOTE — Care Management Important Message (Signed)
Important Message  Patient Details  Name: Darren Rogers MRN: [id number] Date of Birth: 05/29/1956   Important Message Given:  Yes - Medicare IM     Darolyn Rua, LCSW 03/26/2023, 10:49 AM

## 2023-03-26 NOTE — TOC Progression Note (Signed)
Transition of Care [facility]) - Progression Note    Patient Details  Name: Darren Rogers MRN: [id number] Date of Birth: 1956/04/07  [facility] of [facility]) CM/SW Contact  Darolyn Rua, [place] Phone Number: 03/26/2023, 10:44 AM  Clinical Narrative:     Patient has no bed offers at this time from local facilities, CSW has expanded search to larger geographical area, referrals sent.   Pending SNF bed offers at this time.   Expected Discharge Plan: Home w Home Health Services Barriers to Discharge: Continued Medical Work up  Expected Discharge Plan and Services                                               Social Determinants of Health (SDOH) Interventions SDOH Screenings   Food Insecurity: No Food Insecurity (03/22/2023)  Housing: Low Risk  (03/22/2023)  Transportation Needs: No Transportation Needs (03/22/2023)  Utilities: Not At Risk (03/22/2023)  Tobacco Use: High Risk (03/22/2023)    Readmission Risk Interventions    12/21/2022    9:13 AM 10/13/2020   11:28 AM  Readmission Risk Prevention Plan  Transportation Screening Complete Complete  PCP or Specialist Appt within 3-5 Days Complete --  HRI or Home Care Consult Complete   Social Work Consult for Recovery Care Planning/Counseling Complete   Palliative Care Screening Not Applicable Not Applicable  Medication Review Oceanographer) Referral to Pharmacy Complete

## 2023-03-26 NOTE — Progress Notes (Signed)
Patient Name: Darren Rogers Date of Encounter: 03/26/2023 [facility] Cardiologist: Darren Bears, MD   Interval Summary  .    Patient feels well without chest pain, shortness of breath, palpitations, lightheadedness, or edema.  He feels like he is back to baseline.  He notes that he has been taking some of his medications prior to arrival though later in the same conversation he states that he was not taking anything for at least a month.  Vital Signs .    Vitals:   03/25/23 2231 03/26/23 0451 03/26/23 0736 03/26/23 1257  BP: 105/76 109/78 129/88 91/67  Pulse: 73 76 97 73  Resp: 20 20 20 20   Temp: 98.3 F (36.8 C) 98.4 F (36.9 C) 98.3 F (36.8 C) 98.1 F (36.7 C)  TempSrc: Oral Oral Oral Oral  SpO2: 90% 100% 95% 98%  Weight:      Height:        Intake/Output Summary (Last 24 hours) at 03/26/2023 1346 Last data filed at 03/26/2023 1257 Gross per 24 hour  Intake 240 ml  Output 150 ml  Net 90 ml      03/24/2023    5:00 AM 03/23/2023    5:00 AM 03/21/2023    5:39 PM  Last 3 Weights  Weight (lbs) 148 lb 5.9 oz 149 lb 14.6 oz 150 lb  Weight (kg) 67.3 kg 68 kg 68.04 kg      Telemetry/ECG    Atrial fibrillation with ventricular rates 95-120 bpm - Personally Reviewed  Physical Exam .   GEN: No acute distress.   Neck: No JVD Cardiac: Irregularly irregular rhythm with 1/6 systolic murmur. Respiratory: Clear to auscultation bilaterally. GI: Soft, nontender, non-distended  MS: No edema  Assessment & Plan .     Acute on chronic HFrEF: Mr. Micciche appears euvolemic and states that he is back to his baseline.  He does not wish to take any more furosemide due to urinary frequency.  I think it is reasonable to discontinue his standing furosemide.  At discharge, I would advocate for him having a prescription for furosemide 40 mg daily as needed for weight gain/edema.  I recommend continuation of digoxin, metoprolol succinate, Entresto, and spironolactone at  current doses.  Soft blood pressure precludes escalation of Entresto at this time.  Importance of medication compliance and routine follow-up, which have been recurrent issues for Mr. Pallanes care, was reinforced.  Permanent atrial fibrillation: Ventricular rates remain borderline at rest.  Unfortunately, we do not have any additional long-term pharmacologic rate control options at our disposal, as Mr. Bonno is already on the maximum doses of metoprolol and digoxin.  Diltiazem is not a good option with his reduced EF.  IV amiodarone could be considered as a temporizing measure, though given concerns for inconsistent anticoagulation, this would increase the risk of pharmacologic cardioversion and cardioembolic events.  Additionally, oral amiodarone is contraindicated for management of permanent atrial fibrillation given its unfavorable side effect profile.  I have asked Mr. Shortall to ambulate with nursing/PT so that we can see how his heart rate responds.  If he has significant spikes in his heart rates, we will need to consider electrophysiology consultation for further guidance.  Cardioversion remains a suboptimal option as well given Mr. Palms medication noncompliance as well as long duration of atrial fibrillation that would make it unlikely for him to maintain sinus rhythm.  Coronary artery disease: No angina reported.  Patient is known to have CTO of distal LCx, which we will  continue to manage medically.  Continue statin and apixaban and lieu of aspirin for secondary prevention.  PAD: No ongoing symptoms.  Continue secondary prevention and outpatient follow-up with vascular surgery.  For questions or updates, please contact [facility] Please consult [email or web address] for contact info under [facility].     Signed, Yvonne Kendall, MD

## 2023-03-31 ENCOUNTER — Other Ambulatory Visit: Payer: Self-pay

## 2023-04-02 ENCOUNTER — Other Ambulatory Visit: Payer: Self-pay

## 2023-04-05 ENCOUNTER — Other Ambulatory Visit: Payer: Self-pay

## 2023-04-15 ENCOUNTER — Other Ambulatory Visit: Payer: Self-pay

## 2023-04-19 ENCOUNTER — Encounter: Payer: 59 | Admitting: Cardiology

## 2023-04-23 ENCOUNTER — Encounter: Payer: 59 | Admitting: Cardiology

## 2023-07-10 ENCOUNTER — Emergency Department: Payer: 59

## 2023-07-10 ENCOUNTER — Inpatient Hospital Stay
Admission: EM | Admit: 2023-07-10 | Discharge: 2023-07-23 | DRG: 291 | Disposition: A | Discharge status: Home Health | Payer: 59 | Attending: Internal Medicine | Admitting: Internal Medicine

## 2023-07-10 ENCOUNTER — Encounter: Payer: Self-pay | Admitting: Emergency Medicine

## 2023-07-10 ENCOUNTER — Other Ambulatory Visit: Payer: Self-pay

## 2023-07-10 DIAGNOSIS — I4891 Unspecified atrial fibrillation: Principal | ICD-10-CM | POA: Diagnosis present

## 2023-07-10 DIAGNOSIS — Z8249 Family history of ischemic heart disease and other diseases of the circulatory system: Secondary | ICD-10-CM

## 2023-07-10 DIAGNOSIS — I426 Alcoholic cardiomyopathy: Secondary | ICD-10-CM | POA: Diagnosis present

## 2023-07-10 DIAGNOSIS — F17210 Nicotine dependence, cigarettes, uncomplicated: Secondary | ICD-10-CM | POA: Diagnosis present

## 2023-07-10 DIAGNOSIS — Z91148 Patient's other noncompliance with medication regimen for other reason: Secondary | ICD-10-CM

## 2023-07-10 DIAGNOSIS — I5043 Acute on chronic combined systolic (congestive) and diastolic (congestive) heart failure: Secondary | ICD-10-CM | POA: Diagnosis present

## 2023-07-10 DIAGNOSIS — I2489 Other forms of acute ischemic heart disease: Secondary | ICD-10-CM | POA: Diagnosis present

## 2023-07-10 DIAGNOSIS — I959 Hypotension, unspecified: Secondary | ICD-10-CM | POA: Diagnosis present

## 2023-07-10 DIAGNOSIS — I493 Ventricular premature depolarization: Secondary | ICD-10-CM | POA: Diagnosis present

## 2023-07-10 DIAGNOSIS — J9601 Acute respiratory failure with hypoxia: Secondary | ICD-10-CM

## 2023-07-10 DIAGNOSIS — M79662 Pain in left lower leg: Secondary | ICD-10-CM | POA: Diagnosis present

## 2023-07-10 DIAGNOSIS — K7040 Alcoholic hepatic failure without coma: Secondary | ICD-10-CM | POA: Diagnosis present

## 2023-07-10 DIAGNOSIS — I482 Chronic atrial fibrillation, unspecified: Secondary | ICD-10-CM | POA: Diagnosis present

## 2023-07-10 DIAGNOSIS — I110 Hypertensive heart disease with heart failure: Principal | ICD-10-CM | POA: Diagnosis present

## 2023-07-10 DIAGNOSIS — Z7901 Long term (current) use of anticoagulants: Secondary | ICD-10-CM

## 2023-07-10 DIAGNOSIS — I25118 Atherosclerotic heart disease of native coronary artery with other forms of angina pectoris: Secondary | ICD-10-CM | POA: Diagnosis present

## 2023-07-10 DIAGNOSIS — Z5982 Transportation insecurity: Secondary | ICD-10-CM

## 2023-07-10 DIAGNOSIS — E876 Hypokalemia: Secondary | ICD-10-CM | POA: Diagnosis present

## 2023-07-10 DIAGNOSIS — I5023 Acute on chronic systolic (congestive) heart failure: Secondary | ICD-10-CM | POA: Diagnosis present

## 2023-07-10 DIAGNOSIS — E785 Hyperlipidemia, unspecified: Secondary | ICD-10-CM | POA: Diagnosis present

## 2023-07-10 DIAGNOSIS — R54 Age-related physical debility: Secondary | ICD-10-CM | POA: Diagnosis present

## 2023-07-10 DIAGNOSIS — R601 Generalized edema: Secondary | ICD-10-CM | POA: Insufficient documentation

## 2023-07-10 DIAGNOSIS — M79661 Pain in right lower leg: Secondary | ICD-10-CM | POA: Diagnosis present

## 2023-07-10 DIAGNOSIS — I420 Dilated cardiomyopathy: Secondary | ICD-10-CM

## 2023-07-10 DIAGNOSIS — E663 Overweight: Secondary | ICD-10-CM | POA: Insufficient documentation

## 2023-07-10 DIAGNOSIS — Z79899 Other long term (current) drug therapy: Secondary | ICD-10-CM

## 2023-07-10 DIAGNOSIS — I472 Ventricular tachycardia, unspecified: Secondary | ICD-10-CM | POA: Diagnosis present

## 2023-07-10 DIAGNOSIS — I441 Atrioventricular block, second degree: Secondary | ICD-10-CM | POA: Diagnosis present

## 2023-07-10 DIAGNOSIS — R0602 Shortness of breath: Secondary | ICD-10-CM | POA: Diagnosis not present

## 2023-07-10 DIAGNOSIS — I4821 Permanent atrial fibrillation: Secondary | ICD-10-CM | POA: Diagnosis present

## 2023-07-10 DIAGNOSIS — F1020 Alcohol dependence, uncomplicated: Secondary | ICD-10-CM | POA: Diagnosis present

## 2023-07-10 DIAGNOSIS — E871 Hypo-osmolality and hyponatremia: Secondary | ICD-10-CM | POA: Diagnosis present

## 2023-07-10 DIAGNOSIS — E872 Acidosis, unspecified: Secondary | ICD-10-CM | POA: Diagnosis present

## 2023-07-10 DIAGNOSIS — Z7982 Long term (current) use of aspirin: Secondary | ICD-10-CM

## 2023-07-10 DIAGNOSIS — I4892 Unspecified atrial flutter: Secondary | ICD-10-CM | POA: Diagnosis present

## 2023-07-10 DIAGNOSIS — I255 Ischemic cardiomyopathy: Secondary | ICD-10-CM | POA: Diagnosis present

## 2023-07-10 DIAGNOSIS — K7031 Alcoholic cirrhosis of liver with ascites: Secondary | ICD-10-CM | POA: Insufficient documentation

## 2023-07-10 DIAGNOSIS — I509 Heart failure, unspecified: Secondary | ICD-10-CM

## 2023-07-10 DIAGNOSIS — Z833 Family history of diabetes mellitus: Secondary | ICD-10-CM

## 2023-07-10 DIAGNOSIS — I5021 Acute systolic (congestive) heart failure: Secondary | ICD-10-CM | POA: Diagnosis present

## 2023-07-10 LAB — URINALYSIS, ROUTINE W REFLEX MICROSCOPIC
Glucose, UA: NEGATIVE mg/dL
Hgb urine dipstick: NEGATIVE
Ketones, ur: NEGATIVE mg/dL
Leukocytes,Ua: NEGATIVE
Nitrite: NEGATIVE
Protein, ur: 30 mg/dL — AB
Specific Gravity, Urine: 1.026 (ref 1.005–1.030)
pH: 5 (ref 5.0–8.0)

## 2023-07-10 LAB — CBC WITH DIFFERENTIAL/PLATELET
Abs Immature Granulocytes: 0.04 10*3/uL (ref 0.00–0.07)
Basophils Absolute: 0 10*3/uL (ref 0.0–0.1)
Basophils Relative: 1 %
Eosinophils Absolute: 0 10*3/uL (ref 0.0–0.5)
Eosinophils Relative: 1 %
HCT: 35.8 % — ABNORMAL LOW (ref 39.0–52.0)
Hemoglobin: 12.7 g/dL — ABNORMAL LOW (ref 13.0–17.0)
Immature Granulocytes: 1 %
Lymphocytes Relative: 35 %
Lymphs Abs: 1.5 10*3/uL (ref 0.7–4.0)
MCH: 32.8 pg (ref 26.0–34.0)
MCHC: 35.5 g/dL (ref 30.0–36.0)
MCV: 92.5 fL (ref 80.0–100.0)
Monocytes Absolute: 0.4 10*3/uL (ref 0.1–1.0)
Monocytes Relative: 9 %
Neutro Abs: 2.4 10*3/uL (ref 1.7–7.7)
Neutrophils Relative %: 53 %
Platelets: 175 10*3/uL (ref 150–400)
RBC: 3.87 MIL/uL — ABNORMAL LOW (ref 4.22–5.81)
RDW: 17.2 % — ABNORMAL HIGH (ref 11.5–15.5)
WBC: 4.5 10*3/uL (ref 4.0–10.5)
nRBC: 0 % (ref 0.0–0.2)

## 2023-07-10 LAB — URINE DRUG SCREEN, QUALITATIVE (ARMC ONLY)
Amphetamines, Ur Screen: NOT DETECTED
Barbiturates, Ur Screen: NOT DETECTED
Benzodiazepine, Ur Scrn: NOT DETECTED
Cannabinoid 50 Ng, Ur ~~LOC~~: NOT DETECTED
Cocaine Metabolite,Ur ~~LOC~~: NOT DETECTED
MDMA (Ecstasy)Ur Screen: NOT DETECTED
Methadone Scn, Ur: NOT DETECTED
Opiate, Ur Screen: NOT DETECTED
Phencyclidine (PCP) Ur S: NOT DETECTED
Tricyclic, Ur Screen: NOT DETECTED

## 2023-07-10 LAB — COMPREHENSIVE METABOLIC PANEL
ALT: 35 U/L (ref 0–44)
AST: 65 U/L — ABNORMAL HIGH (ref 15–41)
Albumin: 3.3 g/dL — ABNORMAL LOW (ref 3.5–5.0)
Alkaline Phosphatase: 165 U/L — ABNORMAL HIGH (ref 38–126)
Anion gap: 16 — ABNORMAL HIGH (ref 5–15)
BUN: 13 mg/dL (ref 8–23)
CO2: 17 mmol/L — ABNORMAL LOW (ref 22–32)
Calcium: 8.7 mg/dL — ABNORMAL LOW (ref 8.9–10.3)
Chloride: 92 mmol/L — ABNORMAL LOW (ref 98–111)
Creatinine, Ser: 0.86 mg/dL (ref 0.61–1.24)
GFR, Estimated: >60 mL/min (ref >60)
Glucose, Bld: 95 mg/dL (ref 70–99)
Potassium: 4.2 mmol/L (ref 3.5–5.1)
Sodium: 125 mmol/L — ABNORMAL LOW (ref 135–145)
Total Bilirubin: 4.9 mg/dL — ABNORMAL HIGH (ref 0.0–1.2)
Total Protein: 6.5 g/dL (ref 6.5–8.1)

## 2023-07-10 LAB — DIGOXIN LEVEL: Digoxin Level: <0.2 ng/mL — ABNORMAL LOW (ref 0.8–2.0)

## 2023-07-10 LAB — ETHANOL: Alcohol, Ethyl (B): <10 mg/dL (ref <10)

## 2023-07-10 LAB — LACTIC ACID, PLASMA
Lactic Acid, Venous: 2.6 mmol/L (ref 0.5–1.9)
Lactic Acid, Venous: 2.6 mmol/L (ref 0.5–1.9)

## 2023-07-10 LAB — RESP PANEL BY RT-PCR (RSV, FLU A&B, COVID)  RVPGX2
Influenza A by PCR: NEGATIVE
Influenza B by PCR: NEGATIVE
Resp Syncytial Virus by PCR: NEGATIVE
SARS Coronavirus 2 by RT PCR: NEGATIVE

## 2023-07-10 LAB — TSH: TSH: 3.879 u[IU]/mL (ref 0.350–4.500)

## 2023-07-10 LAB — BRAIN NATRIURETIC PEPTIDE: B Natriuretic Peptide: 899.4 pg/mL — ABNORMAL HIGH (ref 0.0–100.0)

## 2023-07-10 LAB — MAGNESIUM: Magnesium: 2 mg/dL (ref 1.7–2.4)

## 2023-07-10 LAB — TROPONIN I (HIGH SENSITIVITY)
Troponin I (High Sensitivity): 12 ng/L (ref <18)
Troponin I (High Sensitivity): 12 ng/L (ref <18)

## 2023-07-10 LAB — OSMOLALITY: Osmolality: 260 mosm/kg — ABNORMAL LOW (ref 275–295)

## 2023-07-10 LAB — SODIUM, URINE, RANDOM: Sodium, Ur: <10 mmol/L

## 2023-07-10 LAB — OSMOLALITY, URINE: Osmolality, Ur: 644 mosm/kg (ref 300–900)

## 2023-07-10 MED ORDER — THIAMINE MONONITRATE 100 MG PO TABS
100.0000 mg | ORAL_TABLET | Freq: Every day | ORAL | Status: DC
  Administered 2023-07-10 – 2023-07-23 (×12): 100 mg via ORAL
  Filled 2023-07-10 (×14): qty 1

## 2023-07-10 MED ORDER — ACETAMINOPHEN 325 MG PO TABS
650.0000 mg | ORAL_TABLET | ORAL | Status: DC | PRN
Start: 2023-07-10 — End: 2023-07-23
  Administered 2023-07-17 – 2023-07-22 (×3): 650 mg via ORAL
  Filled 2023-07-10 (×4): qty 2

## 2023-07-10 MED ORDER — METOPROLOL TARTRATE 5 MG/5ML IV SOLN
5.0000 mg | INTRAVENOUS | Status: DC | PRN
  Administered 2023-07-10: 5 mg via INTRAVENOUS

## 2023-07-10 MED ORDER — SPIRONOLACTONE 12.5 MG HALF TABLET
12.5000 mg | ORAL_TABLET | Freq: Every day | ORAL | Status: DC
  Filled 2023-07-10: qty 1

## 2023-07-10 MED ORDER — SODIUM CHLORIDE 0.9 % IV SOLN: 250.0000 mL | INTRAVENOUS | Status: AC | PRN

## 2023-07-10 MED ORDER — DIGOXIN 125 MCG PO TABS
0.1250 mg | ORAL_TABLET | Freq: Every day | ORAL | Status: DC
  Administered 2023-07-11 – 2023-07-15 (×5): 0.125 mg via ORAL
  Filled 2023-07-10 (×5): qty 1

## 2023-07-10 MED ORDER — ASPIRIN 81 MG PO TBEC
81.0000 mg | DELAYED_RELEASE_TABLET | Freq: Every day | ORAL | Status: DC
  Administered 2023-07-10 – 2023-07-12 (×3): 81 mg via ORAL
  Filled 2023-07-10 (×3): qty 1

## 2023-07-10 MED ORDER — FUROSEMIDE 10 MG/ML IJ SOLN
40.0000 mg | Freq: Once | INTRAMUSCULAR | Status: AC
  Administered 2023-07-10: 40 mg via INTRAVENOUS
  Filled 2023-07-10: qty 4

## 2023-07-10 MED ORDER — FOLIC ACID 1 MG PO TABS
1.0000 mg | ORAL_TABLET | Freq: Every day | ORAL | Status: DC
  Administered 2023-07-10 – 2023-07-23 (×12): 1 mg via ORAL
  Filled 2023-07-10 (×14): qty 1

## 2023-07-10 MED ORDER — POLYETHYLENE GLYCOL 3350 17 G PO PACK: 17.0000 g | PACK | Freq: Every day | ORAL | Status: DC | PRN

## 2023-07-10 MED ORDER — SODIUM CHLORIDE 0.9% FLUSH
3.0000 mL | Freq: Two times a day (BID) | INTRAVENOUS | Status: DC
  Administered 2023-07-10 – 2023-07-23 (×22): 3 mL via INTRAVENOUS

## 2023-07-10 MED ORDER — SACUBITRIL-VALSARTAN 24-26 MG PO TABS
1.0000 | ORAL_TABLET | Freq: Two times a day (BID) | ORAL | Status: DC
  Administered 2023-07-10 – 2023-07-11 (×3): 1 via ORAL
  Filled 2023-07-10 (×6): qty 1

## 2023-07-10 MED ORDER — METOPROLOL SUCCINATE ER 50 MG PO TB24
150.0000 mg | ORAL_TABLET | Freq: Two times a day (BID) | ORAL | Status: DC
  Administered 2023-07-11: 150 mg via ORAL
  Filled 2023-07-10: qty 3
  Filled 2023-07-10 (×2): qty 1
  Filled 2023-07-10: qty 3

## 2023-07-10 MED ORDER — APIXABAN 5 MG PO TABS
5.0000 mg | ORAL_TABLET | Freq: Two times a day (BID) | ORAL | Status: DC
  Administered 2023-07-11 – 2023-07-23 (×24): 5 mg via ORAL
  Filled 2023-07-10 (×27): qty 1

## 2023-07-10 MED ORDER — THIAMINE HCL 100 MG/ML IJ SOLN
100.0000 mg | Freq: Every day | INTRAMUSCULAR | Status: DC
  Filled 2023-07-10 (×2): qty 2

## 2023-07-10 MED ORDER — NICOTINE 21 MG/24HR TD PT24
21.0000 mg | MEDICATED_PATCH | Freq: Every day | TRANSDERMAL | Status: DC
Start: 2023-07-10 — End: 2023-07-23
  Administered 2023-07-10 – 2023-07-12 (×3): 21 mg via TRANSDERMAL
  Filled 2023-07-10 (×11): qty 1

## 2023-07-10 MED ORDER — LORAZEPAM 2 MG/ML IJ SOLN: 1.0000 mg | INTRAMUSCULAR | Status: AC | PRN

## 2023-07-10 MED ORDER — ADULT MULTIVITAMIN W/MINERALS CH
1.0000 | ORAL_TABLET | Freq: Every day | ORAL | Status: DC
  Administered 2023-07-10 – 2023-07-23 (×12): 1 via ORAL
  Filled 2023-07-10 (×15): qty 1

## 2023-07-10 MED ORDER — LORAZEPAM 1 MG PO TABS
1.0000 mg | ORAL_TABLET | ORAL | Status: AC | PRN
  Filled 2023-07-10: qty 2

## 2023-07-10 MED ORDER — FUROSEMIDE 10 MG/ML IJ SOLN
40.0000 mg | Freq: Two times a day (BID) | INTRAMUSCULAR | Status: DC
  Administered 2023-07-11: 40 mg via INTRAVENOUS
  Filled 2023-07-10: qty 4

## 2023-07-10 MED ORDER — PANTOPRAZOLE SODIUM 40 MG PO TBEC
40.0000 mg | DELAYED_RELEASE_TABLET | Freq: Every day | ORAL | Status: DC
  Administered 2023-07-10 – 2023-07-23 (×13): 40 mg via ORAL
  Filled 2023-07-10 (×14): qty 1

## 2023-07-10 MED ORDER — SODIUM CHLORIDE 0.9% FLUSH: 3.0000 mL | INTRAVENOUS | Status: DC | PRN

## 2023-07-10 MED ORDER — ONDANSETRON HCL 4 MG/2ML IJ SOLN: 4.0000 mg | Freq: Four times a day (QID) | INTRAMUSCULAR | Status: DC | PRN

## 2023-07-10 NOTE — ED Provider Notes (Signed)
 [facility] Provider Note    Event Date/Time   First MD Initiated Contact with Patient 07/10/23 1518     (approximate)   History   Chief Complaint Tachycardia and Shortness of Breath   HPI  Darren Rogers is a 68 y.o. male with past medical history of hypertension, hyperlipidemia, CAD, CHF, permanent atrial fibrillation on Eliquis , PAD, and polysubstance abuse who presents to the ED for shortness of breath.  Per EMS, patient initially called out for difficulty breathing, found to be in respiratory distress with decreased responsiveness as well as heart rate in the 190s to 200s.  EMS reports that monitor showed atrial fibrillation with RVR, however blood pressure at that time appeared stable.  Due to patient's distress and significantly elevated heart rate, EMS decided to proceed with cardioversion.  He was given 2 mg of IM Versed  and shocked at 200 J, EMS reports patient had significant change in rate afterwards down to the 110s to 120s.  They report that he was more alert following cardioversion and able to answer questions, but somewhat somnolent due to Versed  administration.  On arrival to the ED, patient is somnolent but arousable.  He reports difficulty breathing but denies any pain in his chest.      Physical Exam   Triage Vital Signs: ED Triage Vitals  Encounter Vitals Group     BP      Systolic BP Percentile      Diastolic BP Percentile      Pulse      Resp      Temp      Temp src      SpO2      Weight      Height      Head Circumference      Peak Flow      Pain Score      Pain Loc      Pain Education      Exclude from Growth Chart     Most recent vital signs: Vitals:   07/10/23 1630 07/10/23 1700  BP: (!) 133/116 (!) 120/96  Pulse:    Resp:    Temp:    SpO2:      Constitutional: Somnolent but arousable to voice. Eyes: Conjunctivae are normal. Head: Atraumatic. Nose: No congestion/rhinnorhea. Mouth/Throat: Mucous membranes are  moist.  Cardiovascular: Tachycardic, irregularly irregular rhythm. Grossly normal heart sounds.  2+ radial pulses bilaterally. Respiratory: Tachypneic with increased respiratory effort, crackles noted throughout. Gastrointestinal: Soft and nontender. No distention. Musculoskeletal: 3+ pitting edema to thighs bilaterally. Neurologic:  Normal speech and language.  Globally weak with no gross focal neurologic deficits appreciated.    ED Results / Procedures / Treatments   Labs (all labs ordered are listed, but only abnormal results are displayed) Labs Reviewed  CBC WITH DIFFERENTIAL/PLATELET - Abnormal; Notable for the following components:      Result Value   RBC 3.87 (*)    Hemoglobin 12.7 (*)    HCT 35.8 (*)    RDW 17.2 (*)    All other components within normal limits  BRAIN NATRIURETIC PEPTIDE - Abnormal; Notable for the following components:   B Natriuretic Peptide 899.4 (*)    All other components within normal limits  BLOOD GAS, VENOUS - Abnormal; Notable for the following components:   pCO2, Ven 36 (*)    Acid-base deficit 3.9 (*)    All other components within normal limits  URINALYSIS, ROUTINE W REFLEX MICROSCOPIC - Abnormal; Notable for  the following components:   Color, Urine AMBER (*)    APPearance CLEAR (*)    Bilirubin Urine MODERATE (*)    Protein, ur 30 (*)    Bacteria, UA RARE (*)    All other components within normal limits  LACTIC ACID, PLASMA - Abnormal; Notable for the following components:   Lactic Acid, Venous 2.6 (*)    All other components within normal limits  COMPREHENSIVE METABOLIC PANEL - Abnormal; Notable for the following components:   Sodium 125 (*)    Chloride 92 (*)    CO2 17 (*)    Calcium  8.7 (*)    Albumin  3.3 (*)    AST 65 (*)    Alkaline Phosphatase 165 (*)    Total Bilirubin 4.9 (*)    Anion gap 16 (*)    All other components within normal limits  RESP PANEL BY RT-PCR (RSV, FLU A&B, COVID)  RVPGX2  CULTURE, BLOOD (ROUTINE X 2)   CULTURE, BLOOD (ROUTINE X 2)  URINE DRUG SCREEN, QUALITATIVE ([facility] ONLY)  ETHANOL  MAGNESIUM   DIGOXIN  LEVEL  LACTIC ACID, PLASMA  COMPREHENSIVE METABOLIC PANEL  TROPONIN I (HIGH SENSITIVITY)  TROPONIN I (HIGH SENSITIVITY)     EKG  ED ECG REPORT I, Carlin Palin, the attending physician, personally viewed and interpreted this ECG.   Date: 07/10/2023  EKG Time: 16:10  Rate: 115  Rhythm: sinus tachycardia  Axis: Normal  Intervals:none  ST&T Change: Anterolateral T wave inversions  RADIOLOGY Chest x-ray reviewed and interpreted by me with cardiomegaly and pulmonary edema, no focal infiltrate noted.  PROCEDURES:  Critical Care performed: Yes, see critical care procedure note(s)  Procedures   MEDICATIONS ORDERED IN ED: Medications  furosemide  (LASIX ) injection 40 mg (40 mg Intravenous Given 07/10/23 1653)     IMPRESSION / MDM / ASSESSMENT AND PLAN / ED COURSE  I reviewed the triage vital signs and the nursing notes.                              68 y.o. male with past medical history of hypertension, hyperlipidemia, CAD, CHF, permanent atrial fibrillation on Eliquis , PAD, and polysubstance abuse who presents to the ED for shortness of breath and decreased responsiveness, subsequently cardioverted with EMS, remains short of breath on arrival.  Patient's presentation is most consistent with acute presentation with potential threat to life or bodily function.  Differential diagnosis includes, but is not limited to, arrhythmia, ACS, PE, CHF, renal failure, liver failure, electrolyte abnormality, AKI, anemia, sepsis, UTI, pneumonia.  Patient ill-appearing, somnolent but arousable to voice and with some mild to moderate respiratory distress.  He is tachypneic with increased work of breathing, but currently maintaining oxygen saturations on 2 L nasal cannula.  EKG shows sinus tachycardia with ST changes, patient noted to have permanent atrial fibrillation per cardiology notes  with questionable medication compliance and would avoid further attempts at cardioversion.  Labs and chest x-ray are pending at this time, patient does appear clinically fluid overloaded.  Labs with no significant anemia or leukocytosis, he does have elevated BNP along with pulmonary edema on his chest x-ray.  We will diurese with IV Lasix , patient also with some hyponatremia but no AKI noted.  LFTs with bilirubin elevation but no symptoms to suspect biliary obstruction, likely due to chronic alcohol abuse.  Patient continues to require 2 L nasal cannula of supplemental oxygen, case discussed with hospitalist for admission.  FINAL CLINICAL IMPRESSION(S) / ED DIAGNOSES   Final diagnoses:  Atrial fibrillation with RVR (HCC)  Acute on chronic congestive heart failure, unspecified heart failure type (HCC)  Acute respiratory failure with hypoxia (HCC)     Rx / DC Orders   ED Discharge Orders     None        Note:  This document was prepared using Dragon voice recognition software and may include unintentional dictation errors.   Willo Dunnings, MD 07/10/23 [phone]

## 2023-07-10 NOTE — ED Triage Notes (Signed)
 Patient presents to ED via [facility] for Afib RVR. Original call for difficulty breathing and on EMS arrival, pt found to have HR 190s-200s. Patient also lethargic for EMS.  Due to inability to obtain access, pt given versed  and shocked by EMS at 200J.  Patient's HR 110s on arrival, but still in afib.

## 2023-07-10 NOTE — ED Notes (Addendum)
 Pt was repositioned in bed--he started c/o anxiety. Ativan  given per order. Po metoprolol  held for now d/t BP and normal heart rate.

## 2023-07-10 NOTE — ED Notes (Signed)
 When this RN attempted to give pt his eliquis , he stated that he hadn't been on it and he refused to take it.

## 2023-07-10 NOTE — ED Notes (Signed)
 Pt's HR started increasing to 115-120's.  Lopressor  given per order.

## 2023-07-10 NOTE — ED Notes (Signed)
Pt appears to be resting comfortably at this time.

## 2023-07-10 NOTE — H&P (Signed)
 History and Physical    Darren Rogers FMW:[id number] DOB: May 23, 1956 DOA: 07/10/2023  PCP: Pcp, No (Confirm with patient/family/NH records and if not entered, this has to be entered at [facility] point of entry) Patient coming from: Home  I have personally briefly reviewed patient's old medical records in [facility]  Chief Complaint: SOB, swelling up, feeling sick  HPI: Darren Rogers is a 68 y.o. male with medical history significant of medication noncompliance, CAD, chronic A-fib on Eliquis  and digoxin , HTN, HLD, chronic HFrEF with LVEF 35-40%, frequent PVCs, alcohol abuse and recurrent alcoholic pancreatitis presented with increasing shortness of breath, peripheral edema feeling malaise.  Patient admitted that she has not been pliant with his CHF medications  take my medication occasionally.  This time he started to feel peripheral edema about 2 weeks ago with increasing exertional dyspnea gradually getting worse denied any cough no chest pains no fever or chills.  Last 2 to 3 days he developed constant dyspnea, even at rest and his appetite significant decreased  swelling everywhere.  He is not on Lasix .  Denies any palpitations blurry vision lightheadedness.  This afternoon he called EMS for shortness of breath.  EMS arrived and found patient in rapid A-fib with heart rate 1 90-200, EMS unable to establish peripheral access and patient was given Versed  and shocked with 200 J and rhythm return to normal sinus.  ED Course: Rate in the 110-120s, EKG shows sinus tachycardia.  Blood pressure 120/90, O2 saturation 94% on 2 L.  Chest x-ray showed pulmonary edema.  Blood work showed creatinine 0.8 bicarb 17, sodium 125 potassium 4.2, WBC 4.5.  VBG showed 7.37/36/20.  Patient was given IV Lasix  40 mg x 1.  Review of Systems: As per HPI otherwise 14 point review of systems negative.    Past Medical History:  Diagnosis Date   Chronic HFrEF (heart failure with reduced ejection fraction) (HCC)    a.  03/29/2014: EF 55-60%, mild LVH, normal RVSP;  b. 05/2015 Echo: EF 60-65%, triv AI, mild MR; c. 03/2021 Echo: EF 55-60%, no rwma, mild LVH, nl RV fxn, mildly dil LA, mild MR; d. 11/2022 Echo: EF 35-40%, mild LVH, nl RV fxn, RVSP 53.83mmhg. Mild BAE. Mild to mod MR/TR/PR. Mild AI. Ao root 42mm.   Claudication (HCC)    a. 06/2015 ABI: R - 0.73, L - 0.73. 30-49% bilat SFA stenosis. 50-74% R Profunda stenosis; b. 01/2017 ABI: R 0.89, L 0.88, TBI R 0.65, L 1.0.   Erectile dysfunction    Essential hypertension    Hyperlipidemia    Non-obstructive CAD    a. 01/27/2014 Cath: LM nl, LAD mild diff dz w/o obs, LCX no sig obs - scattered 20-30% mLCx, RCA no obs dz, EF 70%; b. 06/2015 MV; EF 60%, no ischemia; c. 07/2020 MV: No ischemia/scar; d. 11/2022 Cath: LM nl, LAD 35ost/p, LCX 20d, LPDA fills from dLAD, LPAV 100 CTO, RCA small, nl.   Persistent atrial fibrillation (HCC)    a. Pt says Dx >19yrs ago w/ ? RFCA @ [facility];  b. Recurrent 12/2013;  b. Rx Flecainide  and Xarelto -->Intermittent compliance; c. 03/2021 Zio: 100% Afib w/ avg rate of 99 bpm. Rare PVCs.   PVC's (premature ventricular contractions)    a. 05/2017 Zio: 4000 PVCs in 48 hrs (2%). Brief run of SVT.   Tobacco abuse    a. ongoing - 1 ppd.    Past Surgical History:  Procedure Laterality Date   CARDIAC CATHETERIZATION     CARDIAC CATHETERIZATION  [facility]   LEFT HEART CATH Right 01/27/2014   Procedure: LEFT HEART CATH;  Surgeon: Ozell JONETTA Fell, MD;  Location: [facility] CATH LAB;  Service: Cardiovascular;  Laterality: Right;   RIGHT/LEFT HEART CATH AND CORONARY ANGIOGRAPHY N/A 12/23/2022   Procedure: RIGHT/LEFT HEART CATH AND CORONARY ANGIOGRAPHY;  Surgeon: Mady Bruckner, MD;  Location: [facility] INVASIVE CV LAB;  Service: Cardiovascular;  Laterality: N/A;     reports that he has been smoking cigarettes. He has a 15 pack-year smoking history. He has never used smokeless tobacco. He reports current alcohol use. He reports that he does not currently use drugs  after having used the following drugs: Cocaine.  No Known Allergies  Family History  Problem Relation Age of Onset   Coronary artery disease Father 5   Diabetes Mother    Diabetes Sister    Diabetes Brother    Diabetes Maternal Grandmother    Diabetes Sister    Diabetes Sister      Prior to Admission medications   Medication Sig Start Date End Date Taking? Authorizing Provider  apixaban  (ELIQUIS ) 5 MG TABS tablet Take 1 tablet (5 mg total) by mouth 2 (two) times daily. 03/26/23   Darci Pore, MD  aspirin  EC 81 MG tablet Take 1 tablet (81 mg total) by mouth daily. Swallow whole. 12/28/22   Alexander, Natalie, DO  atorvastatin  (LIPITOR) 40 MG tablet Take 1 tablet (40 mg total) by mouth daily. 12/28/22 06/26/23  Alexander, Natalie, DO  digoxin  (LANOXIN ) 0.125 MG tablet Take 1 tablet (0.125 mg total) by mouth daily. 03/26/23   Darci Pore, MD  folic acid  (FOLVITE ) 1 MG tablet Take 1 tablet (1 mg total) by mouth daily. 12/29/22   Alexander, Natalie, DO  furosemide  (LASIX ) 20 MG tablet Take 2 tablets (40 mg total) by mouth daily as needed for fluid or edema. Increase by 1 tablet (20 mg total) by mouth TWICE daily (total daily dose 40 mg) as needed for up to 3 days for increased leg swelling, shortness of breath, weight gain 5+ lbs over 1-2 days. Seek medical care if these symptoms are not improving with increased dose. 03/26/23   Darci Pore, MD  metoprolol  succinate (TOPROL -XL) 100 MG 24 hr tablet Take 1.5 tablets (150 mg total) by mouth 2 (two) times daily. Take with or immediately following a meal. 03/26/23   Darci Pore, MD  Multiple Vitamin (MULTIVITAMIN WITH MINERALS) TABS tablet Take 1 tablet by mouth daily. 12/29/22   Alexander, Natalie, DO  nicotine  (NICODERM CQ  - DOSED IN MG/24 HOURS) 21 mg/24hr patch Place 1 patch (21 mg total) onto the skin daily. 03/27/23   Darci Pore, MD  nitroGLYCERIN  (NITROSTAT ) 0.4 MG SL tablet Place 1 tablet (0.4  mg total) under the tongue every 5 (five) minutes x 3 doses as needed for chest pain. 12/28/22   Alexander, Natalie, DO  pantoprazole  (PROTONIX ) 40 MG tablet Take 1 tablet (40 mg total) by mouth daily. 12/28/22   Alexander, Natalie, DO  polyethylene glycol (MIRALAX  / GLYCOLAX ) 17 g packet Take 17 g by mouth daily as needed. 05/24/22   Josette Ade, MD  sacubitril -valsartan  (ENTRESTO ) 24-26 MG Take 1 tablet by mouth 2 (two) times daily. 03/26/23   Darci Pore, MD  spironolactone  (ALDACTONE ) 25 MG tablet Take 0.5 tablets (12.5 mg total) by mouth daily. 03/26/23   Darci Pore, MD  thiamine  (VITAMIN B-1) 100 MG tablet Take 1 tablet (100 mg total) by mouth daily. 12/29/22   Alexander, Natalie, DO    Physical Exam:  Vitals:   07/10/23 1530 07/10/23 1630 07/10/23 1654 07/10/23 1700  BP: (!) 130/103 (!) 133/116  (!) 120/96  Pulse: (!) 116     Resp: (!) 21     Temp:      TempSrc:      SpO2: 94%     Weight:   83.8 kg   Height:   5' 6 (1.676 m)     Constitutional: NAD, calm, comfortable Vitals:   07/10/23 1530 07/10/23 1630 07/10/23 1654 07/10/23 1700  BP: (!) 130/103 (!) 133/116  (!) 120/96  Pulse: (!) 116     Resp: (!) 21     Temp:      TempSrc:      SpO2: 94%     Weight:   83.8 kg   Height:   5' 6 (1.676 m)    Eyes: PERRL, lids and conjunctivae normal.  Jaundice ENMT: Mucous membranes are moist. Posterior pharynx clear of any exudate or lesions.Normal dentition.  Neck: normal, supple, no masses, no thyromegaly Respiratory: clear to auscultation bilaterally, no wheezing, crackles to bilateral mid levels, increasing respiratory effort.  Positive signs of accessory muscle use.  Cardiovascular: Regular rate and rhythm, no murmurs / rubs / gallops.  Anasarca from ankle to bilateral thighs and lower abdominal wall. 2+ pedal pulses. No carotid bruits.  Abdomen: no tenderness, no masses palpated. No hepatosplenomegaly. Bowel sounds positive.  Musculoskeletal: no clubbing  / cyanosis. No joint deformity upper and lower extremities. Good ROM, no contractures. Normal muscle tone.  Skin: no rashes, lesions, ulcers. No induration Neurologic: CN 2-12 grossly intact. Sensation intact, DTR normal. Strength 5/5 in all 4.  Psychiatric: Normal judgment and insight. Alert and oriented x 3. Normal mood.     Labs on Admission: I have personally reviewed following labs and imaging studies  CBC: Recent Labs  Lab 07/10/23 1548  WBC 4.5  NEUTROABS 2.4  HGB 12.7*  HCT 35.8*  MCV 92.5  PLT 175   Basic Metabolic Panel: Recent Labs  Lab 07/10/23 1552  NA 125*  K 4.2  CL 92*  CO2 17*  GLUCOSE 95  BUN 13  CREATININE 0.86  CALCIUM  8.7*  MG 2.0   GFR: Estimated Creatinine Clearance: 83.5 mL/min (by C-G formula based on SCr of 0.86 mg/dL). Liver Function Tests: Recent Labs  Lab 07/10/23 1552  AST 65*  ALT 35  ALKPHOS 165*  BILITOT 4.9*  PROT 6.5  ALBUMIN  3.3*   No results for input(s): LIPASE, AMYLASE in the last 168 hours. No results for input(s): AMMONIA in the last 168 hours. Coagulation Profile: No results for input(s): INR, PROTIME in the last 168 hours. Cardiac Enzymes: No results for input(s): CKTOTAL, CKMB, CKMBINDEX, TROPONINI in the last 168 hours. BNP (last 3 results) No results for input(s): PROBNP in the last 8760 hours. HbA1C: No results for input(s): HGBA1C in the last 72 hours. CBG: No results for input(s): GLUCAP in the last 168 hours. Lipid Profile: No results for input(s): CHOL, HDL, LDLCALC, TRIG, CHOLHDL, LDLDIRECT in the last 72 hours. Thyroid  Function Tests: No results for input(s): TSH, T4TOTAL, FREET4, T3FREE, THYROIDAB in the last 72 hours. Anemia Panel: No results for input(s): VITAMINB12, FOLATE, FERRITIN, TIBC, IRON, RETICCTPCT in the last 72 hours. Urine analysis:    Component Value Date/Time   COLORURINE AMBER (A) 07/10/2023 1534   APPEARANCEUR CLEAR (A)  07/10/2023 1534   APPEARANCEUR Clear 07/30/2016 1600   LABSPEC 1.026 07/10/2023 1534   LABSPEC 1.019 03/28/2014 1430   PHURINE  5.0 07/10/2023 1534   GLUCOSEU NEGATIVE 07/10/2023 1534   GLUCOSEU Negative 03/28/2014 1430   HGBUR NEGATIVE 07/10/2023 1534   BILIRUBINUR MODERATE (A) 07/10/2023 1534   BILIRUBINUR Negative 07/30/2016 1600   BILIRUBINUR Negative 03/28/2014 1430   KETONESUR NEGATIVE 07/10/2023 1534   PROTEINUR 30 (A) 07/10/2023 1534   NITRITE NEGATIVE 07/10/2023 1534   LEUKOCYTESUR NEGATIVE 07/10/2023 1534   LEUKOCYTESUR Negative 03/28/2014 1430    Radiological Exams on Admission: DG Chest Portable 1 View Result Date: 07/10/2023 CLINICAL DATA:  Shortness of breath EXAM: PORTABLE CHEST 1 VIEW COMPARISON:  03/23/2023 FINDINGS: Cardiomegaly. Mild diffuse interstitial opacity. Small right pleural effusion. The visualized skeletal structures are unremarkable. IMPRESSION: Cardiomegaly with mild diffuse interstitial opacity, consistent with edema. Small right pleural effusion. No focal airspace opacity. Electronically Signed   By: Marolyn JONETTA Jaksch M.D.   On: 07/10/2023 16:12    EKG: Independently reviewed.  Sinus tachycardia, chronic ST depressions on lateral leads.  Assessment/Plan Principal Problem:   CHF (congestive heart failure) (HCC) Active Problems:   A-fib (HCC)   Alcohol use disorder, moderate, dependence (HCC)   Acute HFrEF (heart failure with reduced ejection fraction) (HCC)  (please populate well all problems here in Problem List. (For example, if patient is on BP meds at home and you resume or decide to hold them, it is a problem that needs to be her. Same for CAD, COPD, HLD and so on)  A-fib with RVR -Status post direct cardioversion by EMS -Rhythm maintained on sinus in the ED. -Continue metoprolol  XL 150 mg daily -Continue digoxin  0.125 mg daily, digoxin  level pending -Continue Eliquis   Acute on chronic HFrEF decompensation Acute hypoxic respiratory  failure -Significant fluid overload, continue IV diuresis 40 mg daily Lasix  -Echocardiogram and LHC was done recently, will not repeat echo at this time -Strict I&O -Continue Entresto  spironolactone   Acute transaminitis Bilirubinemia, acute -Probably multifactorial, suspect acute alcoholic hepatitis on top of hepatic congestion from CHF decompensation -Estimated discriminant factor<32, no indication for steroid -Treating CHF decompensation as above -No RUQ symptoms, check RUQ ultrasound rule out any biliary obstructions  Acute on chronic hyponatremia -Hypervolemic, 2/2 CHF decompensation -Aggressive diuresis -Send serum and urine osmolality  HLD -Hold off statin, trend LFTs  CAD -No chest pains, troponin negative x 1 -Recent LHC results reviewed, single vessel CAD with good collaterals -Continue aspirin  and Eliquis   Alcohol abuse -Last drink was last night -Only there is no symptoms and signs of alcohol withdrawal -Start CIWA protocol with as needed benzos  Medication noncompliance -Educate patient regarding importance of concurrent with his medications -Consult case management, may benefit from home care/home visiting nurse  DVT prophylaxis: Eliquis  Code Status: Full code Family Communication: None at bedside Disposition Plan: Expect less than 2 midnight hospital stay Consults called: None Admission status: Tele obs   Cort ONEIDA Mana MD [facility] Pager [phone]  07/10/2023, 5:32 PM

## 2023-07-11 ENCOUNTER — Observation Stay: Payer: 59

## 2023-07-11 DIAGNOSIS — I5021 Acute systolic (congestive) heart failure: Secondary | ICD-10-CM | POA: Diagnosis not present

## 2023-07-11 DIAGNOSIS — K7031 Alcoholic cirrhosis of liver with ascites: Secondary | ICD-10-CM | POA: Diagnosis present

## 2023-07-11 DIAGNOSIS — I5043 Acute on chronic combined systolic (congestive) and diastolic (congestive) heart failure: Secondary | ICD-10-CM | POA: Diagnosis present

## 2023-07-11 DIAGNOSIS — I4821 Permanent atrial fibrillation: Secondary | ICD-10-CM | POA: Diagnosis present

## 2023-07-11 DIAGNOSIS — E876 Hypokalemia: Secondary | ICD-10-CM | POA: Diagnosis present

## 2023-07-11 DIAGNOSIS — J9601 Acute respiratory failure with hypoxia: Secondary | ICD-10-CM | POA: Diagnosis present

## 2023-07-11 DIAGNOSIS — I472 Ventricular tachycardia, unspecified: Secondary | ICD-10-CM | POA: Diagnosis present

## 2023-07-11 DIAGNOSIS — I255 Ischemic cardiomyopathy: Secondary | ICD-10-CM | POA: Diagnosis present

## 2023-07-11 DIAGNOSIS — Z91148 Patient's other noncompliance with medication regimen for other reason: Secondary | ICD-10-CM | POA: Diagnosis not present

## 2023-07-11 DIAGNOSIS — E872 Acidosis, unspecified: Secondary | ICD-10-CM | POA: Diagnosis present

## 2023-07-11 DIAGNOSIS — E663 Overweight: Secondary | ICD-10-CM | POA: Insufficient documentation

## 2023-07-11 DIAGNOSIS — E785 Hyperlipidemia, unspecified: Secondary | ICD-10-CM | POA: Diagnosis present

## 2023-07-11 DIAGNOSIS — I426 Alcoholic cardiomyopathy: Secondary | ICD-10-CM | POA: Diagnosis present

## 2023-07-11 DIAGNOSIS — Z7901 Long term (current) use of anticoagulants: Secondary | ICD-10-CM | POA: Diagnosis not present

## 2023-07-11 DIAGNOSIS — R601 Generalized edema: Secondary | ICD-10-CM | POA: Diagnosis not present

## 2023-07-11 DIAGNOSIS — I4819 Other persistent atrial fibrillation: Secondary | ICD-10-CM | POA: Diagnosis not present

## 2023-07-11 DIAGNOSIS — E871 Hypo-osmolality and hyponatremia: Secondary | ICD-10-CM | POA: Diagnosis present

## 2023-07-11 DIAGNOSIS — F1020 Alcohol dependence, uncomplicated: Secondary | ICD-10-CM | POA: Diagnosis present

## 2023-07-11 DIAGNOSIS — I420 Dilated cardiomyopathy: Secondary | ICD-10-CM | POA: Diagnosis present

## 2023-07-11 DIAGNOSIS — I4892 Unspecified atrial flutter: Secondary | ICD-10-CM | POA: Diagnosis present

## 2023-07-11 DIAGNOSIS — I2489 Other forms of acute ischemic heart disease: Secondary | ICD-10-CM | POA: Diagnosis present

## 2023-07-11 DIAGNOSIS — R0602 Shortness of breath: Secondary | ICD-10-CM | POA: Diagnosis present

## 2023-07-11 DIAGNOSIS — I4811 Longstanding persistent atrial fibrillation: Secondary | ICD-10-CM | POA: Diagnosis not present

## 2023-07-11 DIAGNOSIS — K7040 Alcoholic hepatic failure without coma: Secondary | ICD-10-CM | POA: Diagnosis present

## 2023-07-11 DIAGNOSIS — I110 Hypertensive heart disease with heart failure: Secondary | ICD-10-CM | POA: Diagnosis present

## 2023-07-11 DIAGNOSIS — I4891 Unspecified atrial fibrillation: Secondary | ICD-10-CM | POA: Diagnosis not present

## 2023-07-11 DIAGNOSIS — I5023 Acute on chronic systolic (congestive) heart failure: Secondary | ICD-10-CM | POA: Diagnosis present

## 2023-07-11 DIAGNOSIS — Z7982 Long term (current) use of aspirin: Secondary | ICD-10-CM | POA: Diagnosis not present

## 2023-07-11 DIAGNOSIS — F17210 Nicotine dependence, cigarettes, uncomplicated: Secondary | ICD-10-CM | POA: Diagnosis present

## 2023-07-11 DIAGNOSIS — Z8249 Family history of ischemic heart disease and other diseases of the circulatory system: Secondary | ICD-10-CM | POA: Diagnosis not present

## 2023-07-11 DIAGNOSIS — I482 Chronic atrial fibrillation, unspecified: Secondary | ICD-10-CM | POA: Diagnosis not present

## 2023-07-11 DIAGNOSIS — Z79899 Other long term (current) drug therapy: Secondary | ICD-10-CM | POA: Diagnosis not present

## 2023-07-11 LAB — COMPREHENSIVE METABOLIC PANEL
ALT: 32 U/L (ref 0–44)
AST: 50 U/L — ABNORMAL HIGH (ref 15–41)
Albumin: 3 g/dL — ABNORMAL LOW (ref 3.5–5.0)
Alkaline Phosphatase: 140 U/L — ABNORMAL HIGH (ref 38–126)
Anion gap: 12 (ref 5–15)
BUN: 15 mg/dL (ref 8–23)
CO2: 20 mmol/L — ABNORMAL LOW (ref 22–32)
Calcium: 8.6 mg/dL — ABNORMAL LOW (ref 8.9–10.3)
Chloride: 95 mmol/L — ABNORMAL LOW (ref 98–111)
Creatinine, Ser: 0.86 mg/dL (ref 0.61–1.24)
GFR, Estimated: >60 mL/min (ref >60)
Glucose, Bld: 111 mg/dL — ABNORMAL HIGH (ref 70–99)
Potassium: 3.8 mmol/L (ref 3.5–5.1)
Sodium: 127 mmol/L — ABNORMAL LOW (ref 135–145)
Total Bilirubin: 4.3 mg/dL — ABNORMAL HIGH (ref 0.0–1.2)
Total Protein: 5.8 g/dL — ABNORMAL LOW (ref 6.5–8.1)

## 2023-07-11 LAB — URINE DRUG SCREEN, QUALITATIVE (ARMC ONLY)
Amphetamines, Ur Screen: NOT DETECTED
Barbiturates, Ur Screen: NOT DETECTED
Benzodiazepine, Ur Scrn: POSITIVE — AB
Cannabinoid 50 Ng, Ur ~~LOC~~: NOT DETECTED
Cocaine Metabolite,Ur ~~LOC~~: NOT DETECTED
MDMA (Ecstasy)Ur Screen: NOT DETECTED
Methadone Scn, Ur: NOT DETECTED
Opiate, Ur Screen: NOT DETECTED
Phencyclidine (PCP) Ur S: NOT DETECTED
Tricyclic, Ur Screen: NOT DETECTED

## 2023-07-11 LAB — HIV ANTIBODY (ROUTINE TESTING W REFLEX): HIV Screen 4th Generation wRfx: NONREACTIVE

## 2023-07-11 MED ORDER — FUROSEMIDE 10 MG/ML IJ SOLN
8.0000 mg/h | INTRAVENOUS | Status: AC
  Administered 2023-07-11 – 2023-07-13 (×2): 4 mg/h via INTRAVENOUS
  Administered 2023-07-14: 8 mg/h via INTRAVENOUS
  Filled 2023-07-11 (×3): qty 20

## 2023-07-11 MED ORDER — SODIUM BICARBONATE 650 MG PO TABS
650.0000 mg | ORAL_TABLET | Freq: Two times a day (BID) | ORAL | Status: DC
  Administered 2023-07-11 (×2): 650 mg via ORAL
  Filled 2023-07-11 (×3): qty 1

## 2023-07-11 NOTE — Progress Notes (Addendum)
 Progress Note   Patient: Darren Rogers FMW:[id number] DOB: 1955/11/20 DOA: 07/10/2023     0 DOS: the patient was seen and examined on 07/11/2023   Brief hospital course: Darren Rogers is a 68 y.o. male with medical history significant of medication noncompliance, CAD, chronic A-fib on Eliquis  and digoxin , HTN, HLD, chronic HFrEF with LVEF 35-40%, frequent PVCs, alcohol abuse and recurrent alcoholic pancreatitis presented with increasing shortness of breath, peripheral edema feeling malaise. Patient drinks 48 ounces of beer twice a day, he has created taking his medicines. Upon arriving to hospital, patient was found to have anasarca, chest x-ray showed cardiomegaly with pulmonary edema, significant elevated BNP.  Patient was started on diuretics for CHF exacerbation.   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 Noncompliant with medical treatment. Patient has ejection fraction 35 to 40%, he is not compliant with treatment, he also drinks massive amount of beer each day.  At this point, patient is massively overloaded. I will start furosemide  drip.  I will hold off Aldactone  at this time due to severe hyponatremia. Beta-blocker is also started, will start Entresto  and Farxiga  in the next few days.  Hyponatremia with hypervolemia. Alcohol liver cirrhosis with ascites and jaundice. Lactic acidosis secondary to liver failure. Alcohol dependence. Patient be continued on Lasix  drip, alcohol cessation strongly urged. Patient also had  some lactic acidosis, this is due to liver failure.  Will start sodium bicarbonate . Patient also has a very high risk for alcohol withdrawal, will monitor closely, CIWA protocol.  Single-vessel coronary disease. Continue aspirin .  Paroxysmal atrial fibrillation with RVR. Patient was tachycardic and we arrived in the hospital.  Heart rate better controlled.  Continue beta-blocker and Eliquis .       Subjective:  Patient continues to have significant short of breath, edema.  No cough.  No fever or chills.  Physical Exam: Vitals:   07/11/23 0300 07/11/23 0330 07/11/23 0721 07/11/23 0921  BP: 94/73 98/81 101/63 (!) 89/65  Pulse: 81 87 91 93  Resp: (!) 21 20 (!) 24 (!) 22  Temp:  98 F (36.7 C) 98.2 F (36.8 C) 98.3 F (36.8 C)  TempSrc:    Oral  SpO2: 98% 97% 100% 96%  Weight:      Height:       General exam: Appears calm and comfortable  Respiratory system: Crackles in the base. Respiratory effort normal. Cardiovascular system: Irregular. No JVD, murmurs, rubs, gallops or clicks.  Anasarca. Gastrointestinal system: Abdomen is distended, soft and nontender. No organomegaly or masses felt. Normal bowel sounds heard. Central nervous system: Alert and oriented x3. No focal neurological deficits. Extremities: Symmetric 5 x 5 power. Skin: Jaundiced Psychiatry: Judgement and insight appear normal. Mood & affect appropriate.    Data Reviewed:  Chest x-ray and lab results reviewed.  Family Communication: None  Disposition: Status is: Observation The patient will require care spanning > 2 midnights and should be moved to inpatient because: Severity of disease, IV treatment, high risk of deterioration.     Time spent: 60 minutes  Author: Murvin Mana, MD  07/11/2023 9:55 AM  For on call review [email or web address].

## 2023-07-11 NOTE — Hospital Course (Signed)
 Darren Rogers is a 68 y.o. male with medical history significant of medication noncompliance, CAD, chronic A-fib on Eliquis  and digoxin , HTN, HLD, chronic HFrEF with LVEF 35-40%, frequent PVCs, alcohol abuse and recurrent alcoholic pancreatitis presented with increasing shortness of breath, peripheral edema feeling malaise. Patient drinks 48 ounces of beer twice a day, he has created taking his medicines. Upon arriving to hospital, patient was found to have anasarca, chest x-ray showed cardiomegaly with pulmonary edema, significant elevated BNP.  Patient was started on diuretics for CHF exacerbation.

## 2023-07-11 NOTE — ED Notes (Signed)
 Advised nurse that patient has ready bed

## 2023-07-12 ENCOUNTER — Other Ambulatory Visit (HOSPITAL_COMMUNITY): Payer: Self-pay

## 2023-07-12 ENCOUNTER — Telehealth (HOSPITAL_COMMUNITY): Payer: Self-pay

## 2023-07-12 DIAGNOSIS — I4811 Longstanding persistent atrial fibrillation: Secondary | ICD-10-CM

## 2023-07-12 DIAGNOSIS — I482 Chronic atrial fibrillation, unspecified: Secondary | ICD-10-CM | POA: Diagnosis not present

## 2023-07-12 DIAGNOSIS — I5023 Acute on chronic systolic (congestive) heart failure: Secondary | ICD-10-CM | POA: Diagnosis not present

## 2023-07-12 DIAGNOSIS — K7031 Alcoholic cirrhosis of liver with ascites: Secondary | ICD-10-CM | POA: Diagnosis not present

## 2023-07-12 LAB — COMPREHENSIVE METABOLIC PANEL
ALT: 28 U/L (ref 0–44)
AST: 45 U/L — ABNORMAL HIGH (ref 15–41)
Albumin: 3 g/dL — ABNORMAL LOW (ref 3.5–5.0)
Alkaline Phosphatase: 132 U/L — ABNORMAL HIGH (ref 38–126)
Anion gap: 11 (ref 5–15)
BUN: 15 mg/dL (ref 8–23)
CO2: 26 mmol/L (ref 22–32)
Calcium: 8.4 mg/dL — ABNORMAL LOW (ref 8.9–10.3)
Chloride: 95 mmol/L — ABNORMAL LOW (ref 98–111)
Creatinine, Ser: 1.02 mg/dL (ref 0.61–1.24)
GFR, Estimated: >60 mL/min (ref >60)
Glucose, Bld: 88 mg/dL (ref 70–99)
Potassium: 3.4 mmol/L — ABNORMAL LOW (ref 3.5–5.1)
Sodium: 132 mmol/L — ABNORMAL LOW (ref 135–145)
Total Bilirubin: 3.3 mg/dL — ABNORMAL HIGH (ref 0.0–1.2)
Total Protein: 5.8 g/dL — ABNORMAL LOW (ref 6.5–8.1)

## 2023-07-12 LAB — MAGNESIUM: Magnesium: 1.9 mg/dL (ref 1.7–2.4)

## 2023-07-12 LAB — PHOSPHORUS: Phosphorus: 2.5 mg/dL (ref 2.5–4.6)

## 2023-07-12 MED ORDER — ALBUMIN HUMAN 25 % IV SOLN
25.0000 g | Freq: Once | INTRAVENOUS | Status: AC
  Administered 2023-07-12: 25 g via INTRAVENOUS
  Filled 2023-07-12: qty 100

## 2023-07-12 MED ORDER — METOPROLOL SUCCINATE ER 25 MG PO TB24
25.0000 mg | ORAL_TABLET | Freq: Two times a day (BID) | ORAL | Status: DC
  Administered 2023-07-13: 25 mg via ORAL
  Filled 2023-07-12 (×2): qty 1

## 2023-07-12 MED ORDER — POTASSIUM CHLORIDE CRYS ER 20 MEQ PO TBCR
40.0000 meq | EXTENDED_RELEASE_TABLET | ORAL | Status: AC
Start: 2023-07-12 — End: 2023-07-12
  Filled 2023-07-12: qty 2

## 2023-07-12 MED ORDER — POTASSIUM CHLORIDE CRYS ER 20 MEQ PO TBCR
40.0000 meq | EXTENDED_RELEASE_TABLET | ORAL | Status: AC
  Administered 2023-07-12 (×2): 40 meq via ORAL
  Filled 2023-07-12: qty 2

## 2023-07-12 NOTE — Telephone Encounter (Signed)
 Pharmacy Patient Advocate Encounter  Insurance verification completed.    The patient is insured through [facility]. Patient has Medicare and is not eligible for a copay card, but may be able to apply for patient assistance or Medicare RX Payment Plan (Patient Must reach out to their plan, if eligible for payment plan), if available.    Ran test claim for Entresto  and the current 30 day co-pay is $0.00.  Ran test claim for Farxiga  and the current 30 day co-pay is $0.00.  Ran test claim for Jardiance and the current 30 day co-pay is $0.00.  Ran test claim for Eliquis  and the current 30 day co-pay is $0.00.  Ran test claim for Xarelto  and the current 30 day co-pay is $0.00.  This test claim was processed through [facility]- copay amounts may vary at other pharmacies due to pharmacy/plan contracts, or as the patient moves through the different stages of their insurance plan.

## 2023-07-12 NOTE — Consult Note (Signed)
 Cardiology Consultation   Patient ID: Darren Rogers MRN: [id number]; DOB: [birth year]  Admit date: 07/10/2023 Date of Consult: 07/12/2023  PCP:  Darren Rogers No   [facility] Providers Cardiologist:  Antionette Kirks, MD        Patient Profile:   Darren Rogers is a 68 y.o. male with a hx of medication noncompliance, CAD, chronic atrial fibrillation on Eliquis  and digoxin , HTN, HLD, chronic HFrEF with LVEF 35-40%, frequent PVCs, alcohol abuse, and recurrent alcoholic pancreatitis who is being seen 07/12/2023 for the evaluation of CHF exacerbation and NSVT at the request of Dr. Jeane Miguel.  History of Present Illness:   Darren Rogers has a long history of atrial fibrillation and previously reported some type of catheter ablation that was done 20 years ago at [facility].  Darren Rogers had recurrent atrial fibrillation August 2016 in the setting of chest pain and underwent diagnostic catheterization showing mild nonobstructive CAD.  Darren Rogers was initially placed on flecainide  and Xarelto  however compliance was poor.  In the setting of noncompliance we have been unable to successfully pursue a rhythm control strategy.  Darren Rogers was hospitalized on 3 occasions in 2022 in the setting of rapid A-fib and atypical chest pain.  It was felt that A-fib was likely transitioning to permanent.  Subsequent event monitor showed 100% A-fib burden with an average heart rate of 92 bpm.  In July 2024 patient was admitted with dyspnea and chest pain in the setting of rapid atrial fibrillation and medication noncompliance.  Troponin rose to 101.  Echocardiogram showed new left ventricular dysfunction with EF of 35 to 40%.  Diagnostic catheterization was performed and revealed an occluded L PIV with otherwise nonobstructive disease, and Darren Rogers was medically managed. Hospitalized again in October 2024 with fall at home found to be in A-fib with RVR.  It was felt that diltiazem  was not a good option in the setting of reduced EF and oral amiodarone  was  contraindicated for management of permanent atrial fibrillation giving unfavorable side effect profile.  Darren Rogers was continued on home dose of digoxin , Eliqius, and metoprolol  was increased to 150 mg twice daily.  Darren Rogers was diuresed with net output of 4 L.  Continued on Lasix  40 mg daily as needed, Entresto , and Aldactone .  Medication compliance was encouraged and Darren Rogers was scheduled for cardiology visit as outpatient.  Darren Rogers has since been lost to follow-up.  Patient is a poor historian. Darren Rogers reports that Darren Rogers has noticed increased shortness of breath on exertion and lower extremity edema worsening for the past month and a half. Darren Rogers also endorses lightheadedness and decreased appetite. Darren Rogers denies chest pain, palpitations, nausea, vomiting, and diaphoresis. Darren Rogers has not been compliant with medication at home and cannot tell me what Darren Rogers has and has not been taking or how often Darren Rogers is taking his medications. Darren Rogers does tell me that Darren Rogers continues to drink large amounts of beer daily, up to a 24 pack per day. Darren Rogers lives with his sister and they argue frequently. Worsening dyspnea prompted him to call EMS on 2/8. Upon arrival EMS found patient to be in A-fib with RVR heart rate 190s-200s bpm.  Patient also lethargic for EMS.  BP was stable at that time. Due to inability to obtain access patient given Versed  and shocked by EMS at 200 J with rate improving to 110-120s bpm.   Upon arrival to the ED, patient was in sinus tachycardia with HR 120, blood pressure 120/90, O2 saturation 94% on 2 L supplemental oxygen.  EKG shows sinus  tachycardia rate 117 bpm.  Chest x-ray shows pulmonary edema.  Blood work showed creatinine 0.8, bicarb 17, sodium 125, potassium 4.2, white blood cell 4.5. BNP 899. CXR with small pleural effusions bilaterally. Urine drug screen positive for benzodiazepines. Troponin negative.  Patient was given IV Lasix  40 mg.  Past Medical History:  Diagnosis Date   Chronic HFrEF (heart failure with reduced ejection fraction) (HCC)     a. 03/29/2014: EF 55-60%, mild LVH, normal RVSP;  b. 05/2015 Echo: EF 60-65%, triv AI, mild MR; c. 03/2021 Echo: EF 55-60%, no rwma, mild LVH, nl RV fxn, mildly dil LA, mild MR; d. 11/2022 Echo: EF 35-40%, mild LVH, nl RV fxn, RVSP 53.58mmhg. Mild BAE. Mild to mod MR/TR/PR. Mild AI. Ao root 42mm.   Claudication (HCC)    a. 06/2015 ABI: R - 0.73, L - 0.73. 30-49% bilat SFA stenosis. 50-74% R Profunda stenosis; b. 01/2017 ABI: R 0.89, L 0.88, TBI R 0.65, L 1.0.   Erectile dysfunction    Essential hypertension    Hyperlipidemia    Non-obstructive CAD    a. 01/27/2014 Cath: LM nl, LAD mild diff dz w/o obs, LCX no sig obs - scattered 20-30% mLCx, RCA no obs dz, EF 70%; b. 06/2015 MV; EF 60%, no ischemia; c. 07/2020 MV: No ischemia/scar; d. 11/2022 Cath: LM nl, LAD 35ost/p, LCX 20d, LPDA fills from dLAD, LPAV 100 CTO, RCA small, nl.   Persistent atrial fibrillation (HCC)    a. Pt says Dx >69yrs ago w/ ? RFCA @ [facility];  b. Recurrent 12/2013;  b. Rx Flecainide  and Xarelto -->Intermittent compliance; c. 03/2021 Zio: 100% Afib w/ avg rate of 99 bpm. Rare PVCs.   PVC's (premature ventricular contractions)    a. 05/2017 Zio: 4000 PVCs in 48 hrs (2%). Brief run of SVT.   Tobacco abuse    a. ongoing - 1 ppd.    Past Surgical History:  Procedure Laterality Date   CARDIAC CATHETERIZATION     CARDIAC CATHETERIZATION     [facility]   LEFT HEART CATH Right 01/27/2014   Procedure: LEFT HEART CATH;  Surgeon: Arlander Bellman, MD;  Location[facility] C[facility] LAB;  Service: Cardiovascular;  Laterality: Right;   RIGHT/LEFT HEART CATH AND CORONARY ANGIOGRAPHY N/A 12/23/2022   Procedure: RIGHT/LEFT HEART CATH AND CORONARY ANGIOGRAPHY;  Surgeon: Sammy Crisp, MD;  Location: [facility] INVASIVE CV LAB;  Service: Cardiovascular;  Laterality: N/A;       Inpatient Medications: Scheduled Meds:  apixaban   5 mg Oral BID   aspirin  EC  81 mg Oral Daily   digoxin   0.125 mg Oral Daily   folic acid   1 mg Oral Daily   metoprolol  succinate  25 mg  Oral BID   multivitamin with minerals  1 tablet Oral Daily   nicotine   21 mg Transdermal Daily   pantoprazole   40 mg Oral Daily   potassium chloride   40 mEq Oral Q2H   sodium chloride  flush  3 mL Intravenous Q12H   thiamine   100 mg Oral Daily   Or   thiamine   100 mg Intravenous Daily   Continuous Infusions:  furosemide  (LASIX ) 200 mg in dextrose  5 % 100 mL (2 mg/mL) infusion 4 mg/hr (07/12/23 0419)   PRN Meds: acetaminophen , LORazepam  **OR** LORazepam , metoprolol  tartrate, ondansetron  (ZOFRAN ) IV, polyethylene glycol, sodium chloride  flush  Allergies:   No Known Allergies  Social History:   Social History   Socioeconomic History   Marital status: Single    Spouse name: Not on file  Number of children: Not on file   Years of education: Not on file   Highest education level: Not on file  Occupational History   Not on file  Tobacco Use   Smoking status: Every Day    Current packs/day: 0.50    Average packs/day: 0.5 packs/day for 30.0 years (15.0 ttl pk-yrs)    Types: Cigarettes   Smokeless tobacco: Never  Vaping Use   Vaping status: Never Used  Substance and Sexual Activity   Alcohol use: Yes    Comment: 2 40 ounce beers a day   Drug use: Not Currently    Types: Cocaine   Sexual activity: Not on file  Other Topics Concern   Not on file  Social History Narrative   The patient lives in [place]  with his sister. 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 Darren Rogers has a history of alcoholism but has been clean for 4 years. Darren Rogers is a long-time smoker, one pack per day. No illicit drugs. Darren Rogers is not married.   Social Drivers of Corporate investment banker Strain: Not on file  Food Insecurity: No Food Insecurity (07/11/2023)   Hunger Vital Sign    Worried About Running Out of Food in the Last Year: Never true    Ran Out of Food in the Last Year: Never true  Transportation Needs: Unmet Transportation Needs (07/11/2023)   PRAPARE - Administrator, Civil Service  (Medical): Yes    Lack of Transportation (Non-Medical): Yes  Physical Activity: Not on file  Stress: Not on file  Social Connections: Moderately Isolated (07/11/2023)   Social Connection and Isolation Panel [NHANES]    Frequency of Communication with Friends and Family: More than three times a week    Frequency of Social Gatherings with Friends and Family: Three times a week    Attends Religious Services: More than 4 times per year    Active Member of Clubs or Organizations: No    Attends Banker Meetings: Never    Marital Status: Never married  Intimate Partner Violence: Not At Risk (07/11/2023)   Humiliation, Afraid, Rape, and Kick questionnaire    Fear of Current or Ex-Partner: No    Emotionally Abused: No    Physically Abused: No    Sexually Abused: No    Family History:    Family History  Problem Relation Age of Onset   Coronary artery disease Father 75   Diabetes Mother    Diabetes Sister    Diabetes Brother    Diabetes Maternal Grandmother    Diabetes Sister    Diabetes Sister      ROS:  Please see the history of present illness.   Physical Exam/Data:   Vitals:   07/12/23 1118 07/12/23 1200 07/12/23 1203 07/12/23 1221  BP: 96/65 94/78  96/68  Pulse: (!) 56 80  75  Resp:  (!) 24 20   Temp:      TempSrc:      SpO2:  98% 100%   Weight:      Height:        Intake/Output Summary (Last 24 hours) at 07/12/2023 1535 Last data filed at 07/12/2023 0944 Gross per 24 hour  Intake 273.88 ml  Output 2300 ml  Net -2026.12 ml      07/12/2023    5:00 AM 07/10/2023    4:54 PM 03/24/2023    5:00 AM  Last 3 Weights  Weight (lbs) 167 lb 5.3 oz 184 lb 11.2  oz 148 lb 5.9 oz  Weight (kg) 75.9 kg 83.779 kg 67.3 kg     Body mass index is 27.01 kg/m.  General:  Well nourished, well developed, in no acute distress HEENT: normal Neck: + JVD Cardiac:  irregular rhythm; normal S1, S2; no murmur  Lungs: diminished at the bases bilaterally  Abd: soft, nontender, no  hepatomegaly  Ext: 2+ pitting edema of the lower legs and 1+ pitting edema of the upper legs and flanks Skin: warm and dry  Psych:  Normal affect   EKG:  The EKG was personally reviewed and demonstrates:  sinus tachycardia, rate 117 bpm with nonspecific T wave abnormalities Telemetry:  Telemetry was personally reviewed and demonstrates:  sinus rhythm with frequent PVCs, runs of SVT, and runs of NSVT up to 13 beats  Relevant CV Studies:  12/23/2022 L/RHC Severe single-vessel coronary artery disease with chronic total occlusion of distal LCx/LPLA.  LPDA is supplied by left-to-left collaterals.  There is mild-moderate, non-obstructive disease involving the LAD. Upper normal to mildly elevated left and right heart filling pressures (LVEDP 17 mmHg, PCWP 18 mmHg, RA 7 mmHg). Moderate pulmonary hypertension (mean PA 37 mmHg, PVR 5 WU) Mildly reduced Fick cardiac output/index (CO 3.8 L/min, CI 2.1 L/min/m^2).  12/21/2022 TTE 1. Left ventricular ejection fraction, by estimation, is 35 to 40%. The  left ventricle has moderately decreased function. The left ventricle  demonstrates global hypokinesis. There is mild left ventricular  hypertrophy. Left ventricular diastolic  parameters are indeterminate.   2. Right ventricular systolic function is normal. The right ventricular  size is normal. There is moderately elevated pulmonary artery systolic  pressure. The estimated right ventricular systolic pressure is 53.4 mmHg.   3. Left atrial size was mildly dilated.   4. Right atrial size was mildly dilated.   5. The mitral valve is normal in structure. Mild to moderate mitral valve  regurgitation. No evidence of mitral stenosis.   6. Tricuspid valve regurgitation is moderate.   7. The aortic valve is normal in structure. Aortic valve regurgitation is  mild. No aortic stenosis is present.   8. Pulmonic valve regurgitation is moderate.   9. Aortic dilatation noted. There is mild dilatation of the aortic  root,  measuring 42 mm.  10. Mildly dilated pulmonary artery.  11. The inferior vena cava is normal in size with <50% respiratory  variability, suggesting right atrial pressure of 8 mmHg.   Laboratory Data:  High Sensitivity Troponin:   Recent Labs  Lab 07/10/23 1548 07/10/23 1552  TROPONINIHS 12 12     Chemistry Recent Labs  Lab 07/10/23 1552 07/11/23 0430 07/12/23 0340  NA 125* 127* 132*  K 4.2 3.8 3.4*  CL 92* 95* 95*  CO2 17* 20* 26  GLUCOSE 95 111* 88  BUN 13 15 15   CREATININE 0.86 0.86 1.02  CALCIUM  8.7* 8.6* 8.4*  MG 2.0  --  1.9  GFRNONAA >60 >60 >60  ANIONGAP 16* 12 11    Recent Labs  Lab 07/10/23 1552 07/11/23 0430 07/12/23 0340  PROT 6.5 5.8* 5.8*  ALBUMIN  3.3* 3.0* 3.0*  AST 65* 50* 45*  ALT 35 32 28  ALKPHOS 165* 140* 132*  BILITOT 4.9* 4.3* 3.3*   Lipids No results for input(s): "CHOL", "TRIG", "HDL", "LABVLDL", "LDLCALC", "CHOLHDL" in the last 168 hours.  Hematology Recent Labs  Lab 07/10/23 1548  WBC 4.5  RBC 3.87*  HGB 12.7*  HCT 35.8*  MCV 92.5  MCH 32.8  MCHC 35.5  RDW 17.2*  PLT 175   Thyroid   Recent Labs  Lab 07/10/23 1552  TSH 3.879    BNP Recent Labs  Lab 07/10/23 1548  BNP 899.4*    DDimer No results for input(s): "DDIMER" in the last 168 hours.   Radiology/Studies:  DG Chest 1 View Result Date: 07/11/2023 IMPRESSION: 1. Continued perihilar vascular congestion and mild lower zonal interstitial edema. 2. Hazy perihilar interstitial change increased in the interval which could be ground-glass edema or pneumonitis. 3. Small pleural effusions, unchanged. Electronically Signed   By: Denman Fischer M.D.   On: 07/11/2023 07:41   US  Abdomen Limited RUQ (LIVER/GB) Result Date: 07/10/2023 IMPRESSION: 1. No gallstones. Apparent mild gallbladder wall thickening is probably reactive and/or due to contracted state. 2. No biliary ductal dilatation. 3. Sonographic findings suggestive of cirrhosis.  No liver masses. 4. Small volume  perihepatic ascites. 5. Right pleural effusion. Electronically Signed   By: Levell Reach M.D.   On: 07/10/2023 18:16   DG Chest Portable 1 View Result Date: 07/10/2023 IMPRESSION: Cardiomegaly with mild diffuse interstitial opacity, consistent with edema. Small right pleural effusion. No focal airspace opacity. Electronically Signed   By: Fredricka Jenny M.D.   On: 07/10/2023 16:12     Assessment and Plan:   Acute on chronic systolic congestive heart failure Alcohol induced cardiomyopathy Anasarca Noncompliant with medical treatment - History of CHF since 11/2022. Echo at that time showed LVEF 35-40%. Non-compliant with medications.  - Presented 2/8 with worsening DOE and LE edema - Repeat echo ordered - Appears volume overloaded on exam - BNP 899 - Cr 1.02 - Net output -4 L since admission - Hypotension limiting advancement of GDMT - Continue IV Lasix  drip at 4 mg/hr - Continue to monitor kidney function, strict I/Os, and daily weights - Continue PTA digoxin  0.125 mg daily and metoprolol  succinate 25 mg BID  Paroxysmal atrial fibrillation with RVR - Longstanding history of atrial fibrillation, noncompliant with medications - EMS found patient to be in atrial fibrillation with RVR, rates up to 200s bpm, patient apparently lethargic and in respiratory distress. EMS unable to gain IV access. Patient ultimately given versed  and shocked at 200 J.  - Upon arrival to ED patient was in sinus tachycardia - K 3.4, repletion given. Mag 1.9.  - TSH within normal limits - Repeat echo ordered - Telemetry shows sinus rhythm with frequent ectopy - [id number] 6 - Continue PTA Eliquis  5 mg BID and metoprolol  succinate 25 mg BID  Nonsustained ventricular tachycardia - Known CAD without targets for revascularization - Telemetry shows NSVT up to 13 beats - Continue metoprolol  as above  CAD - LHC 11/2022 showed severe single vessel CAD with good collaterals. No targets for revascularization.  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

## 2023-07-12 NOTE — Progress Notes (Addendum)
 Heart Failure Stewardship Pharmacy Note  PCP: Pcp, No PCP-Cardiologist: Antionette Kirks, MD  HPI: Darren Rogers is a 68 y.o. male with former polysubstance use (cocaine, alcohol, tobacco), recurrent alcoholic pancreatitis, A fib on Eliquis  and digoxin , HTN, HLD, depression, CAD, PAD with claudication, BPH, PVC who presented with heart racing, fall, and mild shortness of breath. Patient had been nonadherent to HF and AF medications including Eliquis  prior to admission. On admission, BNP was 899.4, HS-troponin was 12, lactic acid was 2.6, digoxin  level was <0.2, TSH was 3.879, ethanol was negative, and UDS was negative. Chest x-ray noted perihilar vascular congestion, mild lower zonal interstitial edema and small pleural effusions.    Pertinent cardiac history: TTE in 05/2015 showed LEF 60-65%. Moderate blockage noted on arterial doppler in 06/2015. Stress test in 06/2015 showed T wave inversion in I, II, III, V4, V5, V6 during stress. A 2% PVC burden was noted 04/2017 via long term event monitor. TTE 07/2020 showed LVEF 50-55%. TTE 20/2022 showed LVEF 55-60% and low normal RV function. Event monitor 04/2021 showed 100% AF burden with rate of 99. TTE 11/2022 showed LVEF down to 35-40%, normal RV, mild-moderate MR, moderate TR, mild AR, and moderate PR. LHC 11/2022 showed severe single-vessel disease with CTO of Lcx/LPLA with left-to-left collaterals. During that time PVR was 5, PCWP was 18, and CI was 2.1.   Pertinent Lab Values: Creatinine  Date Value Ref Range Status  09/21/2014 1.10 mg/dL Final    Comment:    1.47-8.29 NOTE: New Reference Range  08/07/14    Creatinine, Ser  Date Value Ref Range Status  07/12/2023 1.02 0.61 - 1.24 mg/dL Final   BUN  Date Value Ref Range Status  07/12/2023 15 8 - 23 mg/dL Final  56/21/3086 9 8 - 27 mg/dL Final  57/84/6962 15 mg/dL Final    Comment:    9-52 NOTE: New Reference Range  08/07/14    Potassium  Date Value Ref Range Status  07/12/2023 3.4  (L) 3.5 - 5.1 mmol/L Final  09/21/2014 3.5 mmol/L Final    Comment:    3.5-5.1 NOTE: New Reference Range  08/07/14    Sodium  Date Value Ref Range Status  07/12/2023 132 (L) 135 - 145 mmol/L Final  08/26/2021 140 134 - 144 mmol/L Final  09/21/2014 139 mmol/L Final    Comment:    135-145 NOTE: New Reference Range  08/07/14    B Natriuretic Peptide  Date Value Ref Range Status  07/10/2023 899.4 (H) 0.0 - 100.0 pg/mL Final    Comment:    Performed at [facility], [address] [place], [place] [address]   Magnesium   Date Value Ref Range Status  07/12/2023 1.9 1.7 - 2.4 mg/dL Final    Comment:    Performed at [facility], [address] [place], [place] [address]  09/21/2014 1.8 mg/dL Final    Comment:    2.7-2.5 THERAPEUTIC RANGE: 4-7 mg/dL TOXIC: > 10 mg/dL  ----------------------- NOTE: New Reference Range  08/07/14    Hemoglobin A1C  Date Value Ref Range Status  03/28/2014 6.4 (H) 4.2 - 6.3 % Final    Comment:    The American Diabetes Association recommends that a primary goal of therapy should be <7% and that physicians should reevaluate the treatment regimen in patients with HbA1c values consistently >8%.    HB A1C (BAYER DCA - WAIVED)  Date Value Ref Range Status  07/30/2016 6.1 <7.0 % Final    Comment:  Diabetic Adult            <7.0                                       Healthy Adult        4.3 - 5.7                                                           (DCCT/NGSP) American Diabetes Association's Summary of Glycemic Recommendations for Adults with Diabetes: Hemoglobin A1c <7.0%. More stringent glycemic goals (A1c <6.0%) may further reduce complications at the cost of increased risk of hypoglycemia.    Hgb A1c MFr Bld  Date Value Ref Range Status  03/22/2023 5.5 4.8 - 5.6 % Final    Comment:    (NOTE) Pre diabetes:          5.7%-6.4%  Diabetes:              >6.4%  Glycemic  control for   <7.0% adults with diabetes    Digoxin  Level  Date Value Ref Range Status  07/10/2023 <0.2 (L) 0.8 - 2.0 ng/mL Final    Comment:    RESULT CONFIRMED BY MANUAL DILUTION [facility] Performed at [facility], [address] [place], [place] [address]    TSH  Date Value Ref Range Status  07/10/2023 3.879 0.350 - 4.500 uIU/mL Final    Comment:    Performed by a 3rd Generation assay with a functional sensitivity of <=0.01 uIU/mL. Performed at [facility], [address] [place], [place] [address]   08/26/2021 1.600 0.450 - 4.500 uIU/mL Final    Vital Signs: Admission weight: 184.7 lbs Temp:  [97.6 F (36.4 C)-98.2 F (36.8 C)] 97.8 F (36.6 C) (02/10 0755) Pulse Rate:  [67-96] 71 (02/10 0946) Cardiac Rhythm: Atrial fibrillation (02/10 0800) Resp:  [17-24] 17 (02/10 0946) BP: (83-103)/(67-83) 91/70 (02/10 0755) SpO2:  [97 %-100 %] 97 % (02/10 0946) Weight:  [75.9 kg (167 lb 5.3 oz)] 75.9 kg (167 lb 5.3 oz) (02/10 0500)  Intake/Output Summary (Last 24 hours) at 07/12/2023 1028 Last data filed at 07/12/2023 0944 Gross per 24 hour  Intake 33.88 ml  Output 3600 ml  Net -3566.12 ml    Current Heart Failure Medications:  Loop diuretic: Furosemide  4 mg/hr Beta-Blocker: Metoprolol  succinate 150 mg BID  ACEI/ARB/ARNI: none MRA: none SGLT2i: none Other: Digoxin  0.125 mg daily   Prior to admission Heart Failure Medications:  Patient reported filling medications at [facility], however, after speaking with them on the phone, there have been no prescriptions filled in the last year.  Assessment: 1. 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 NYHA class III symptoms.  -Symptoms: Reports feeling poorly. Is experiencing fatigue, drowsiness, orthopnea, and LEE. Reports appetite is poor. Extremities are warm. Appears very lethargic. -Volume: Still hypervolemic on exam. JVP elevated.  Urine output is modest on furosemide  gtt at 4 mg/hr. Would recommend increasing to 8 mg/hr, will not likely impact BP much. -Hemodynamics: BP is soft and HR is stable from 60-70s. -BB: Patient is on very high dose metoprolol  after not taking it at home. Restarting  high dose beta blocker after stopping for a year may be contributing to extreme lethargy. Recommend holding tomorrow and then resuming at 25 mg daily with titration based on HR. -ACEI/ARB/ARNI: Entresto  was stopped today for soft BP. Will follow up restarting prior to discharge.  -MRA: Can consider restarting spironolactone  when BP remains >100 mmHg systolic. -SGLT2i: Consider starting Farxiga  10 mg daily -Given patient was not taking digoxin  prior to admission, would check a trough in 3 days.  Plan: 1) Medication changes recommended at this time: -Consider increasing furosemide  gtt to 8 mg/hr -Consider decreasing metoprolol  to 25 mg daily starting tomorrow with slower uptitration  -Consider starting Farxiga  10 mg daily  2) Patient assistance: -Copay for Entresto , Farxiga , Jardiance, Eliquis , and Xarelto  are $0  3) Education: - Patient has been educated on current HF medications and potential additions to HF medication regimen - Patient verbalizes understanding that over the next few months, these medication doses may change and more medications may be added to optimize HF regimen - Patient has been educated on basic disease state pathophysiology and goals of therapy  Medication Assistance / Insurance Benefits Check: Does the patient have prescription insurance?    Type of insurance plan:  Does the patient qualify for medication assistance through manufacturers or grants? Pending  Eligible grants and/or patient assistance programs: Pending  Medication assistance applications in progress: Pending  Medication assistance applications approved: Pending Approved medication assistance renewals will be completed by:  Pending  Outpatient Pharmacy: Prior to admission outpatient pharmacy: Not filling medications PTA     Please do not hesitate to reach out with questions or concerns,  Davanna He, PharmD, CPP, BCPS Heart Failure Pharmacist  Phone - [phone] 07/12/2023 1:30 PM

## 2023-07-12 NOTE — Progress Notes (Addendum)
 Progress Note   Patient: Darren Rogers ZOX:[id number] DOB: 10-10-1955 DOA: 07/10/2023     1 DOS: the patient was seen and examined on 07/12/2023   Brief hospital course: Darren Rogers is a 68 y.o. male with medical history significant of medication noncompliance, CAD, chronic A-fib on Eliquis  and digoxin , HTN, HLD, chronic HFrEF with LVEF 35-40%, frequent PVCs, alcohol abuse and recurrent alcoholic pancreatitis presented with increasing shortness of breath, peripheral edema feeling malaise. Patient drinks 48 ounces of beer twice a day, he has created taking his medicines. Upon arriving to hospital, patient was found to have anasarca, chest x-ray showed cardiomegaly with pulmonary edema, significant elevated BNP.  Patient was started on diuretics for CHF exacerbation.   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 Noncompliant with medical treatment. Patient has ejection fraction 35 to 40%, he is not compliant with treatment, he also drinks massive amount of beer each day.  At this point, patient is massively overloaded. I will start furosemide  drip.  I will hold off Aldactone  at this time due to severe hyponatremia. Blood pressures allow, given 25 g albumin .  Continue furosemide  drip.  Hypokalemia. Potassium was 3.4, repleted by oral.  Recheck levels tomorrow.   Hyponatremia with hypervolemia. Alcohol liver cirrhosis with ascites and  jaundice. Lactic acidosis secondary to liver failure. Alcohol dependence. Patient be continued on Lasix  drip, alcohol cessation strongly urged. Patient also had some lactic acidosis, this is due to liver failure.  Patient also has a very high risk for alcohol withdrawal, will monitor closely, CIWA protocol. Sodium level is better, still with no evidence of alcohol withdrawal.   Single-vessel coronary disease. Continue aspirin .   Paroxysmal atrial fibrillation with RVR. Patient was tachycardic and we arrived in the hospital.  Heart rate better controlled.  Continue beta-blocker and Eliquis .  Nonsustained ventricular tachycardia. Patient had 13 beats of V. tach today, cardiology consult obtained.     Subjective:  Patient still has signal short of breath with minimal exertion.  Still has significant edema.  Physical Exam: Vitals:   07/12/23 1040 07/12/23 1118 07/12/23 1200 07/12/23 1203  BP: (!) 84/69 96/65 94/78    Pulse: 65 (!) 56 80   Resp:   (!) 24 20  Temp:      TempSrc:      SpO2: 98%  98% 100%  Weight:      Height:       General exam: Appears calm and comfortable  Respiratory system: Significant decreased breathing sounds bilaterally. Respiratory effort normal. Cardiovascular system: Irregular. No JVD, murmurs, rubs, gallops or clicks.  Anasarca. Gastrointestinal system: Abdomen is nondistended, soft and nontender. No organomegaly or masses felt. Normal bowel sounds heard. Central nervous system: Alert and oriented. No focal neurological deficits. Extremities: 4+ leg edema. Skin: No rashes, lesions or ulcers Psychiatry: Judgement and insight appear normal. Mood & affect appropriate.    Data Reviewed:  Lab results reviewed.  Family Communication: None  Disposition: Status is: Inpatient Remains inpatient appropriate because:  Severity of disease, IV treatment.     Time spent: 35 minutes  Author: Donaciano Frizzle, MD 07/12/2023 12:10 PM  For on call review  [email or web address].

## 2023-07-13 ENCOUNTER — Inpatient Hospital Stay (HOSPITAL_COMMUNITY)
Admit: 2023-07-13 | Discharge: 2023-07-13 | Disposition: A | Discharge status: Home / Self Care | Payer: 59 | Attending: Medical

## 2023-07-13 ENCOUNTER — Encounter: Payer: Self-pay | Admitting: Internal Medicine

## 2023-07-13 DIAGNOSIS — I482 Chronic atrial fibrillation, unspecified: Secondary | ICD-10-CM | POA: Diagnosis not present

## 2023-07-13 DIAGNOSIS — R601 Generalized edema: Secondary | ICD-10-CM | POA: Diagnosis not present

## 2023-07-13 DIAGNOSIS — I5023 Acute on chronic systolic (congestive) heart failure: Secondary | ICD-10-CM

## 2023-07-13 DIAGNOSIS — I5021 Acute systolic (congestive) heart failure: Secondary | ICD-10-CM

## 2023-07-13 DIAGNOSIS — K7031 Alcoholic cirrhosis of liver with ascites: Secondary | ICD-10-CM | POA: Diagnosis not present

## 2023-07-13 LAB — BASIC METABOLIC PANEL
Anion gap: 10 (ref 5–15)
BUN: 21 mg/dL (ref 8–23)
CO2: 23 mmol/L (ref 22–32)
Calcium: 8.6 mg/dL — ABNORMAL LOW (ref 8.9–10.3)
Chloride: 97 mmol/L — ABNORMAL LOW (ref 98–111)
Creatinine, Ser: 1 mg/dL (ref 0.61–1.24)
GFR, Estimated: >60 mL/min (ref >60)
Glucose, Bld: 107 mg/dL — ABNORMAL HIGH (ref 70–99)
Potassium: 4 mmol/L (ref 3.5–5.1)
Sodium: 130 mmol/L — ABNORMAL LOW (ref 135–145)

## 2023-07-13 LAB — BLOOD GAS, VENOUS
Acid-base deficit: 3.9 mmol/L — ABNORMAL HIGH (ref 0.0–2.0)
Bicarbonate: 20.8 mmol/L (ref 20.0–28.0)
O2 Saturation: 16.9 %
Patient temperature: 37
pCO2, Ven: 36 mm[Hg] — ABNORMAL LOW (ref 44–60)
pH, Ven: 7.37 (ref 7.25–7.43)

## 2023-07-13 LAB — PHOSPHORUS: Phosphorus: 2.6 mg/dL (ref 2.5–4.6)

## 2023-07-13 LAB — MAGNESIUM: Magnesium: 1.9 mg/dL (ref 1.7–2.4)

## 2023-07-13 LAB — DIGOXIN LEVEL: Digoxin Level: 0.3 ng/mL — ABNORMAL LOW (ref 0.8–2.0)

## 2023-07-13 MED ORDER — PERFLUTREN LIPID MICROSPHERE
1.0000 mL | INTRAVENOUS | Status: AC | PRN
Start: 2023-07-13 — End: 2023-07-13
  Administered 2023-07-13: 2 mL via INTRAVENOUS

## 2023-07-13 MED ORDER — SPIRONOLACTONE 12.5 MG HALF TABLET
12.5000 mg | ORAL_TABLET | Freq: Every day | ORAL | Status: DC
  Administered 2023-07-13 – 2023-07-23 (×10): 12.5 mg via ORAL
  Filled 2023-07-13 (×11): qty 1

## 2023-07-13 MED ORDER — POTASSIUM CHLORIDE CRYS ER 20 MEQ PO TBCR
40.0000 meq | EXTENDED_RELEASE_TABLET | Freq: Once | ORAL | Status: AC
  Administered 2023-07-13: 40 meq via ORAL
  Filled 2023-07-13: qty 2

## 2023-07-13 MED ORDER — QUETIAPINE FUMARATE 25 MG PO TABS
25.0000 mg | ORAL_TABLET | Freq: Every day | ORAL | Status: DC
  Administered 2023-07-13 – 2023-07-22 (×10): 25 mg via ORAL
  Filled 2023-07-13 (×10): qty 1

## 2023-07-13 MED ORDER — FUROSEMIDE 10 MG/ML IJ SOLN
40.0000 mg | Freq: Once | INTRAMUSCULAR | Status: AC
  Administered 2023-07-13: 40 mg via INTRAVENOUS
  Filled 2023-07-13: qty 4

## 2023-07-13 MED ORDER — METOPROLOL SUCCINATE ER 25 MG PO TB24
12.5000 mg | ORAL_TABLET | Freq: Every day | ORAL | Status: DC
  Administered 2023-07-14 – 2023-07-18 (×5): 12.5 mg via ORAL
  Filled 2023-07-13 (×5): qty 1

## 2023-07-13 NOTE — Progress Notes (Signed)
Rounding Note    Patient Name: Darren Rogers Date of Encounter: 07/13/2023  [facility] Cardiologist: Lorine Bears, MD   Subjective   UOP -1.1L. patient says he is feeling weakness. He denies chest pain. Breathing is improving. He is still volume up.   Inpatient Medications    Scheduled Meds:  apixaban  5 mg Oral BID   digoxin  0.125 mg Oral Daily   folic acid  1 mg Oral Daily   metoprolol succinate  25 mg Oral BID   multivitamin with minerals  1 tablet Oral Daily   nicotine  21 mg Transdermal Daily   pantoprazole  40 mg Oral Daily   potassium chloride  40 mEq Oral Once   sodium chloride flush  3 mL Intravenous Q12H   spironolactone  12.5 mg Oral Daily   thiamine  100 mg Oral Daily   Or   thiamine  100 mg Intravenous Daily   Continuous Infusions:  furosemide (LASIX) 200 mg in dextrose 5 % 100 mL (2 mg/mL) infusion 4 mg/hr (07/13/23 0540)   PRN Meds: acetaminophen, LORazepam **OR** LORazepam, ondansetron (ZOFRAN) IV, polyethylene glycol, sodium chloride flush   Vital Signs    Vitals:   07/12/23 2034 07/13/23 0038 07/13/23 0355 07/13/23 0745  BP: (!) 89/70 91/74 95/78  106/88  Pulse: 70 78 72 77  Resp:  15 15 18   Temp:  (!) 97.4 F (36.3 C) 98.5 F (36.9 C) 98.6 F (37 C)  TempSrc:  Oral Oral   SpO2:  95% 100% 100%  Weight:   77.6 kg   Height:        Intake/Output Summary (Last 24 hours) at 07/13/2023 0836 Last data filed at 07/13/2023 0535 Gross per 24 hour  Intake 286.66 ml  Output 1100 ml  Net -813.34 ml      07/13/2023    3:55 AM 07/12/2023    5:00 AM 07/10/2023    4:54 PM  Last 3 Weights  Weight (lbs) 171 lb 1.2 oz 167 lb 5.3 oz 184 lb 11.2 oz  Weight (kg) 77.6 kg 75.9 kg 83.779 kg      Telemetry    NSR, PACs/PVCs, no further NSV, brief SVT episode - Personally Reviewed  ECG    No new - Personally Reviewed  Physical Exam   GEN: No acute distress.   Neck: No JVD Cardiac: RRR, no murmurs, rubs, or gallops.  Respiratory: Clear  to auscultation bilaterally. GI: Soft, nontender, non-distended  MS: No edema; No deformity. Neuro:  Nonfocal  Psych: Normal affect   Labs    High Sensitivity Troponin:   Recent Labs  Lab 07/10/23 1548 07/10/23 1552  TROPONINIHS 12 12     Chemistry Recent Labs  Lab 07/10/23 1552 07/11/23 0430 07/12/23 0340 07/13/23 0231  NA 125* 127* 132* 130*  K 4.2 3.8 3.4* 4.0  CL 92* 95* 95* 97*  CO2 17* 20* 26 23  GLUCOSE 95 111* 88 107*  BUN 13 15 15 21   CREATININE 0.86 0.86 1.02 1.00  CALCIUM 8.7* 8.6* 8.4* 8.6*  MG 2.0  --  1.9 1.9  PROT 6.5 5.8* 5.8*  --   ALBUMIN 3.3* 3.0* 3.0*  --   AST 65* 50* 45*  --   ALT 35 32 28  --   ALKPHOS 165* 140* 132*  --   BILITOT 4.9* 4.3* 3.3*  --   GFRNONAA >60 >60 >60 >60  ANIONGAP 16* 12 11 10     Lipids No results for input(s): "  CHOL", "TRIG", "HDL", "LABVLDL", "LDLCALC", "CHOLHDL" in the last 168 hours.  Hematology Recent Labs  Lab 07/10/23 1548  WBC 4.5  RBC 3.87*  HGB 12.7*  HCT 35.8*  MCV 92.5  MCH 32.8  MCHC 35.5  RDW 17.2*  PLT 175   Thyroid  Recent Labs  Lab 07/10/23 1552  TSH 3.879    BNP Recent Labs  Lab 07/10/23 1548  BNP 899.4*    DDimer No results for input(s): "DDIMER" in the last 168 hours.   Radiology    No results found.  Cardiac Studies   12/23/2022 L/RHC Severe single-vessel coronary artery disease with chronic total occlusion of distal LCx/LPLA.  LPDA is supplied by left-to-left collaterals.  There is mild-moderate, non-obstructive disease involving the LAD. Upper normal to mildly elevated left and right heart filling pressures (LVEDP 17 mmHg, PCWP 18 mmHg, RA 7 mmHg). Moderate pulmonary hypertension (mean PA 37 mmHg, PVR 5 WU) Mildly reduced Fick cardiac output/index (CO 3.8 L/min, CI 2.1 L/min/m^2).   12/21/2022 TTE 1. Left ventricular ejection fraction, by estimation, is 35 to 40%. The  left ventricle has moderately decreased function. The left ventricle  demonstrates global  hypokinesis. There is mild left ventricular  hypertrophy. Left ventricular diastolic  parameters are indeterminate.   2. Right ventricular systolic function is normal. The right ventricular  size is normal. There is moderately elevated pulmonary artery systolic  pressure. The estimated right ventricular systolic pressure is 53.4 mmHg.   3. Left atrial size was mildly dilated.   4. Right atrial size was mildly dilated.   5. The mitral valve is normal in structure. Mild to moderate mitral valve  regurgitation. No evidence of mitral stenosis.   6. Tricuspid valve regurgitation is moderate.   7. The aortic valve is normal in structure. Aortic valve regurgitation is  mild. No aortic stenosis is present.   8. Pulmonic valve regurgitation is moderate.   9. Aortic dilatation noted. There is mild dilatation of the aortic root,  measuring 42 mm.  10. Mildly dilated pulmonary artery.  11. The inferior vena cava is normal in size with <50% respiratory  variability, suggesting right atrial pressure of 8 mmHg.     Patient Profile     68 y.o. male  with a hx of medication noncompliance, CAD, chronic atrial fibrillation on Eliquis and digoxin, HTN, HLD, chronic HFrEF with LVEF 35-40%, frequent PVCs, alcohol abuse, and recurrent alcoholic pancreatitis who is being seen 07/12/2023 for the evaluation of CHF exacerbation and NSVT   Assessment & Plan    Acute on chronic systolic heart failure Alcohol induced CM Anasarca Noncompliant with medical treatment - history of CHF since 11/2022. Echo at that time showed LVEF 35-40%. He has h/o medication non-compliance - he presented 2/8 with DOE and LLE started on lasix drip - still volume up - low UOP overnight, -1.1L - increase lasix to 8mg /hr. Given lasix 40mg  this AM - add spironolactone 12.4mg  daily - kidney function stable - repeat echo ordered - low BP limiting GDMT - continue PTA digoxin 0.125mg  daily and Toprol 25mg  BID. Dig level 0.3  Paroxysmal  Afib with RVR - longstanding h/o Afib, noncompliant with medications - upon arrival to the ED he was in sinus tachycardia - keep K>4 and Mag>2 - Tsh wnl - tele shows NSR with frequent ectopy - continue Eliquia 5mg  BID for strok eppx  NSVT - he has known CAD with no targets for revascularization on most recent cath - no further NSVT on tele -  continue metoprolol  CAD - LHC 11/2022 showed severe sigle vessel CAD with good collaterals. No targets for revascularization - no chest pain reported - HS trop negative - continue ASA 81mg  daily  Hypokalemia - goal K>4 - K 4.0  Hyponatremia with hypervolemia Alcohol liver cirrhosis with ascites and jaundice Lactic Acidosis secondary to liver failure Alcohol dependence - continue lasix drip - recommend alcohol cessation - CIWA per IM   For questions or updates, please contact [facility] Please consult [email or web address] for contact info under        Signed, Jun Rightmyer David Stall, PA-C  07/13/2023, 8:36 AM

## 2023-07-13 NOTE — Plan of Care (Signed)

## 2023-07-13 NOTE — Progress Notes (Addendum)
Progress Note   Patient: Darren Rogers ZOX:[id number] DOB: 1955/06/11 DOA: 07/10/2023     2 DOS: the patient was seen and examined on 07/13/2023   Brief hospital course: Darren Rogers is a 68 y.o. male with medical history significant of medication noncompliance, CAD, chronic A-fib on Eliquis and digoxin, HTN, HLD, chronic HFrEF with LVEF 35-40%, frequent PVCs, alcohol abuse and recurrent alcoholic pancreatitis presented with increasing shortness of breath, peripheral edema feeling malaise. Patient drinks 48 ounces of beer twice a day, he has created taking his medicines. Upon arriving to hospital, patient was found to have anasarca, chest x-ray showed cardiomegaly with pulmonary edema, significant elevated BNP.  Patient was started on diuretics for CHF exacerbation.   Principal Problem:   CHF (congestive heart failure) (HCC) Active Problems:   Chronic atrial fibrillation with RVR (HCC)   Hyponatremia   A-fib (HCC)   Alcohol use disorder, moderate, dependence (HCC)   Lactic acidosis   Acute HFrEF (heart failure with reduced ejection fraction) (HCC)   Alcoholic cirrhosis of liver with ascites (HCC)   Anasarca   Overweight (BMI 25.0-29.9)   Acute on chronic HFrEF (heart failure with reduced ejection fraction) (HCC)   Assessment and Plan: Acute respiratory failure with hypoxemia. Upon arriving the emergency room, patient had severe respite distress, oxygen saturation was as low as 78%, he was placed on a breather and later placed on oxygen.  This is secondary to acute on chronic heart failure.  Condition had improved since off diuretics.  Continue wean oxygen.  Acute on chronic systolic congestive heart failure secondary to alcoholic cardiomyopathy Anasarca secondary to congestive heart failure. Noncompliant with medical treatment. Patient has ejection fraction 35 to 40%, he is not compliant with treatment, he also drinks massive amount of beer each day.  At this point, patient is  massively overloaded. I will start furosemide drip.  I will hold off Aldactone at this time due to severe hyponatremia. Patient is now seen by cardiology.  Will need to continue Lasix drip for few days.  Patient still has massive volume overload.   Hypokalemia. Potassium have normalized.   Hyponatremia with hypervolemia. Alcohol liver cirrhosis with ascites and jaundice. Lactic acidosis secondary to liver failure. Alcohol dependence. Patient be continued on Lasix drip, alcohol cessation strongly urged. Patient also had some lactic acidosis, this is due to liver failure.  Patient also has a very high risk for alcohol withdrawal, but so far patient has been doing well without withdrawal symptoms. Continue fluid restriction for hyponatremia.   Single-vessel coronary disease. Continue aspirin.   Paroxysmal atrial fibrillation with RVR. Patient was tachycardic and we arrived in the hospital.  Heart rate better controlled.  Continue beta-blocker and Eliquis.   Nonsustained ventricular tachycardia. Patient had 13 beats of V. tach 2/10.  This is in the setting of acute congestive heart failure and electrolytes abnormality.  Continue to monitor closely.        Subjective:  Short of breath is better today, no cough.  Physical Exam: Vitals:   07/13/23 0038 07/13/23 0355 07/13/23 0745 07/13/23 1115  BP: 91/74 95/78 106/88 97/78  Pulse: 78 72 77 79  Resp: 15 15 18 19   Temp: (!) 97.4 F (36.3 C) 98.5 F (36.9 C) 98.6 F (37 C) 98.8 F (37.1 C)  TempSrc: Oral Oral    SpO2: 95% 100% 100% 100%  Weight:  77.6 kg    Height:       General exam: Appears calm and comfortable  Respiratory system: Clear  to auscultation. Respiratory effort normal. Cardiovascular system: Irregular. No JVD, murmurs, rubs, gallops or clicks.  Anasarca. Gastrointestinal system: Abdomen is nondistended, soft and nontender. No organomegaly or masses felt. Normal bowel sounds heard. Central nervous system: Alert  and oriented. No focal neurological deficits. Extremities: 3+ leg edema Skin: No rashes, lesions or ulcers Psychiatry: Mood & affect appropriate.    Data Reviewed:  Lab results reviewed.  Family Communication: None  Disposition: Status is: Inpatient Remains inpatient appropriate because: Severity of disease, IV treatment.     Time spent: 35 minutes  Author: Marrion Coy, MD 07/13/2023 2:07 PM  For on call review [email or web address].

## 2023-07-13 NOTE — Progress Notes (Signed)
Heart Failure Stewardship Pharmacy Note  PCP: Pcp, No PCP-Cardiologist: Lorine Bears, MD  HPI: [place] is a 68 y.o. male with former polysubstance use (cocaine, alcohol, tobacco), recurrent alcoholic pancreatitis, A fib on Eliquis and digoxin, HTN, HLD, depression, CAD, PAD with claudication, BPH, PVC who presented with heart racing, fall, and mild shortness of breath. Patient had been nonadherent to HF and AF medications including Eliquis prior to admission. On admission, BNP was 899.4, HS-troponin was 12, lactic acid was 2.6, digoxin level was <0.2, TSH was 3.879, ethanol was negative, and UDS was negative. Chest x-ray noted perihilar vascular congestion, mild lower zonal interstitial edema and small pleural effusions.    Pertinent cardiac history: TTE in 05/2015 showed LEF 60-65%. Moderate blockage noted on arterial doppler in 06/2015. Stress test in 06/2015 showed T wave inversion in I, II, III, V4, V5, V6 during stress. A 2% PVC burden was noted 04/2017 via long term event monitor. TTE 07/2020 showed LVEF 50-55%. TTE 20/2022 showed LVEF 55-60% and low normal RV function. Event monitor 04/2021 showed 100% AF burden with rate of 99. TTE 11/2022 showed LVEF down to 35-40%, normal RV, mild-moderate MR, moderate TR, mild AR, and moderate PR. LHC 11/2022 showed severe single-vessel disease with CTO of Lcx/LPLA with left-to-left collaterals. During that time PVR was 5, PCWP was 18, and CI was 2.1.   Pertinent Lab Values: Creatinine  Date Value Ref Range Status  09/21/2014 1.10 mg/dL Final    Comment:    1.61-0.96 NOTE: New Reference Range  08/07/14    Creatinine, Ser  Date Value Ref Range Status  07/13/2023 1.00 0.61 - 1.24 mg/dL Final   BUN  Date Value Ref Range Status  07/13/2023 21 8 - 23 mg/dL Final  04/54/0981 9 8 - 27 mg/dL Final  19/14/7829 15 mg/dL Final    Comment:    5-62 NOTE: New Reference Range  08/07/14    Potassium  Date Value Ref Range Status  07/13/2023 4.0  3.5 - 5.1 mmol/L Final  09/21/2014 3.5 mmol/L Final    Comment:    3.5-5.1 NOTE: New Reference Range  08/07/14    Sodium  Date Value Ref Range Status  07/13/2023 130 (L) 135 - 145 mmol/L Final  08/26/2021 140 134 - 144 mmol/L Final  09/21/2014 139 mmol/L Final    Comment:    135-145 NOTE: New Reference Range  08/07/14    B Natriuretic Peptide  Date Value Ref Range Status  07/10/2023 899.4 (H) 0.0 - 100.0 pg/mL Final    Comment:    Performed at [facility], [address] [place], [place] [address]   Magnesium  Date Value Ref Range Status  07/13/2023 1.9 1.7 - 2.4 mg/dL Final    Comment:    Performed at [facility], [address] [place], [place] [address]  09/21/2014 1.8 mg/dL Final    Comment:    9.6-2.9 THERAPEUTIC RANGE: 4-7 mg/dL TOXIC: > 10 mg/dL  ----------------------- NOTE: New Reference Range  08/07/14    Hemoglobin A1C  Date Value Ref Range Status  03/28/2014 6.4 (H) 4.2 - 6.3 % Final    Comment:    The American Diabetes Association recommends that a primary goal of therapy should be <7% and that physicians should reevaluate the treatment regimen in patients with HbA1c values consistently >8%.    HB A1C (BAYER DCA - WAIVED)  Date Value Ref Range Status  07/30/2016 6.1 <7.0 % Final    Comment:  Diabetic Adult            <7.0                                       Healthy Adult        4.3 - 5.7                                                           (DCCT/NGSP) American Diabetes Association's Summary of Glycemic Recommendations for Adults with Diabetes: Hemoglobin A1c <7.0%. More stringent glycemic goals (A1c <6.0%) may further reduce complications at the cost of increased risk of hypoglycemia.    Hgb A1c MFr Bld  Date Value Ref Range Status  03/22/2023 5.5 4.8 - 5.6 % Final    Comment:    (NOTE) Pre diabetes:          5.7%-6.4%  Diabetes:              >6.4%  Glycemic  control for   <7.0% adults with diabetes    Digoxin Level  Date Value Ref Range Status  07/13/2023 0.3 (L) 0.8 - 2.0 ng/mL Final    Comment:    Performed at [facility], [address] [place], [place] [address]   TSH  Date Value Ref Range Status  07/10/2023 3.879 0.350 - 4.500 uIU/mL Final    Comment:    Performed by a 3rd Generation assay with a functional sensitivity of <=0.01 uIU/mL. Performed at [facility], [address] [place], [place] [address]   08/26/2021 1.600 0.450 - 4.500 uIU/mL Final    Vital Signs: Admission weight: 184.7 lbs Temp:  [97.4 F (36.3 C)-98.8 F (37.1 C)] 98.8 F (37.1 C) (02/11 1115) Pulse Rate:  [70-80] 79 (02/11 1115) Cardiac Rhythm: Normal sinus rhythm (02/11 0800) Resp:  [15-24] 19 (02/11 1115) BP: (86-106)/(62-88) 97/78 (02/11 1115) SpO2:  [95 %-100 %] 100 % (02/11 1115) Weight:  [77.6 kg (171 lb 1.2 oz)] 77.6 kg (171 lb 1.2 oz) (02/11 0355)  Intake/Output Summary (Last 24 hours) at 07/13/2023 1147 Last data filed at 07/13/2023 1100 Gross per 24 hour  Intake 166.66 ml  Output 700 ml  Net -533.34 ml    Current Heart Failure Medications:  Loop diuretic: Furosemide 8 mg/hr Beta-Blocker: Metoprolol succinate 25 mg BID  ACEI/ARB/ARNI: none MRA: spironolactone 12.5 mg daily SGLT2i: none Other: Digoxin 0.125 mg daily   Prior to admission Heart Failure Medications:  Patient reported filling medications at [facility], however, after speaking with them on the phone, there have been no prescriptions filled in the last year. After discussing this with the patient he admits to being off medications for a very long time.  Assessment: 1. 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 NYHA class III symptoms.  -Symptoms: Reports shortness of breath, orthopnea and LEE are improved. Still with significant fatigue/drowsiness. Reports appetite is picking  up. Appears less lethargic today. -Volume: Still hypervolemic on exam. Urine output is modest on furosemide gtt at 4 mg/hr, so the infusion was increased to 8 mg/hr with IV 40 mg bolus. Creatinine and BUN are stable. Weight is trending up ,though  still down from admission weight. -Hemodynamics: BP is soft and HR is stable from 60-70s. -BB: Patient prescribed high dose beta blocker PTA, however had stopped it for several months. Metoprolol was restarted at home dose and contributed to lethargy. HR now stable on metoprolol 25 mg BID. -ACEI/ARB/ARNI: Sherryll Burger was stopped for soft BP. -MRA: Low dose spironolactone given soft BP. -SGLT2i: Consider starting Farxiga 10 mg daily given minimal effect on BP. Empulse trial and to a lesser extent Dictate AHF trial show benefits of inpatient SGLT2i initiation. -Digoxin trough this morning was 0.3. Digoxin was not quite to steady state, so may benefit from checking again in a few days if still admitted or at follow-up if not.  Plan: 1) Medication changes recommended at this time: -Consider starting Farxiga 10 mg daily tomorrow  2) Patient assistance: -Copay for Entresto, Farxiga, Jardiance, Eliquis, and Xarelto are $0  3) Education: - Patient has been educated on current HF medications and potential additions to HF medication regimen - Patient verbalizes understanding that over the next few months, these medication doses may change and more medications may be added to optimize HF regimen - Patient has been educated on basic disease state pathophysiology and goals of therapy  Medication Assistance / Insurance Benefits Check: Does the patient have prescription insurance?    Type of insurance plan:  Does the patient qualify for medication assistance through manufacturers or grants? Pending  Eligible grants and/or patient assistance programs: Pending  Medication assistance applications in progress: Pending  Medication assistance applications approved:  Pending Approved medication assistance renewals will be completed by: Pending  Outpatient Pharmacy: Prior to admission outpatient pharmacy: Not filling medications PTA Is the patient agreeable to switch to [facility]?: Yes Is the patient willing to utilize a [facility] pharmacy at discharge?: Yes  Please do not hesitate to reach out with questions or concerns,  Enos Fling, PharmD, CPP, BCPS Heart Failure Pharmacist  Phone - [phone] 07/13/2023 11:47 AM

## 2023-07-13 NOTE — Progress Notes (Signed)
Heart Failure Nurse Navigator Progress Note  PCP: Pcp, No PCP-Cardiologist: Lorine Bears, MD Admission Diagnosis:  Atrial fibrillation with RVR (HCC) Acute on Chronic congestive heart failure, unspecified heart failure type (HCC) Acute respiratory failure, unspecified heart failure type (HCC) Acute respiratory  failure with hypoxia [facility]) Admitted from: Home via EMS  Presentation:   [place] presented with shortness of breath. EMS found in respiratory distress with decreased responsiveness as well as tachycardia and A-Fib with RVR. Cardioversion by EMS and remained short of breath on arrival to ED. BNP 899. HS-Troponin was 12. Chest X-Ray noted perihilar vascular congestion, mild lower zonal interstitial edema and small pleural effusions.  ECHO/ LVEF: 35-40%  Clinical Course:  Past Medical History:  Diagnosis Date   Chronic HFrEF (heart failure with reduced ejection fraction) (HCC)    a. 03/29/2014: EF 55-60%, mild LVH, normal RVSP;  b. 05/2015 Echo: EF 60-65%, triv AI, mild MR; c. 03/2021 Echo: EF 55-60%, no rwma, mild LVH, nl RV fxn, mildly dil LA, mild MR; d. 11/2022 Echo: EF 35-40%, mild LVH, nl RV fxn, RVSP 53.20mmhg. Mild BAE. Mild to mod MR/TR/PR. Mild AI. Ao root 42mm.   Claudication (HCC)    a. 06/2015 ABI: R - 0.73, L - 0.73. 30-49% bilat SFA stenosis. 50-74% R Profunda stenosis; b. 01/2017 ABI: R 0.89, L 0.88, TBI R 0.65, L 1.0.   Erectile dysfunction    Essential hypertension    Hyperlipidemia    Non-obstructive CAD    a. 01/27/2014 Cath: LM nl, LAD mild diff dz w/o obs, LCX no sig obs - scattered 20-30% mLCx, RCA no obs dz, EF 70%; b. 06/2015 MV; EF 60%, no ischemia; c. 07/2020 MV: No ischemia/scar; d. 11/2022 Cath: LM nl, LAD 35ost/p, LCX 20d, LPDA fills from dLAD, LPAV 100 CTO, RCA small, nl.   Persistent atrial fibrillation (HCC)    a. Pt says Dx >7yrs ago w/ ? RFCA @ [facility];  b. Recurrent 12/2013;  b. Rx Flecainide and Xarelto-->Intermittent compliance; c. 03/2021 Zio: 100%  Afib w/ avg rate of 99 bpm. Rare PVCs.   PVC's (premature ventricular contractions)    a. 05/2017 Zio: 4000 PVCs in 48 hrs (2%). Brief run of SVT.   Tobacco abuse    a. ongoing - 1 ppd.     Social History   Socioeconomic History   Marital status: Single    Spouse name: Not on file   Number of children: Not on file   Years of education: Not on file   Highest education level: Not on file  Occupational History   Not on file  Tobacco Use   Smoking status: Every Day    Current packs/day: 0.50    Average packs/day: 0.5 packs/day for 30.0 years (15.0 ttl pk-yrs)    Types: Cigarettes   Smokeless tobacco: Never  Vaping Use   Vaping status: Never Used  Substance and Sexual Activity   Alcohol use: Yes    Comment: 2 40 ounce beers a day   Drug use: Not Currently    Types: Cocaine   Sexual activity: Not on file  Other Topics Concern   Not on file  Social History Narrative   The patient lives in [place] with his sister. He works in a substance abuse program. He is a long-time smoker, one pack per day. ETOH use 4 ( 40oz beers a day).No illicit drugs. He is not married.   Social Drivers of Health   Financial Resource Strain: Low Risk  (07/13/2023)  Overall Financial Resource Strain (CARDIA)    Difficulty of Paying Living Expenses: Not very hard  Food Insecurity: No Food Insecurity (07/13/2023)   Hunger Vital Sign    Worried About Running Out of Food in the Last Year: Never true    Ran Out of Food in the Last Year: Never true  Transportation Needs: Unmet Transportation Needs (07/13/2023)   PRAPARE - Administrator, Civil Service (Medical): Yes    Lack of Transportation (Non-Medical): Yes  Physical Activity: Not on file  Stress: Not on file  Social Connections: Moderately Isolated (07/11/2023)   Social Connection and Isolation Panel [NHANES]    Frequency of Communication with Friends and Family: More than three times a week    Frequency of Social  Gatherings with Friends and Family: Three times a week    Attends Religious Services: More than 4 times per year    Active Member of Clubs or Organizations: No    Attends Banker Meetings: Never    Marital Status: Never married   Education Assessment and Provision:  Detailed education and instructions provided on heart failure disease management including the following:  Signs and symptoms of Heart Failure When to call the physician Importance of daily weights Low sodium diet Fluid restriction Medication management Anticipated future follow-up appointments  Patient education given on each of the above topics.  Patient acknowledges understanding via teach back method and acceptance of all instructions.  Education Materials:  "Living Better With Heart Failure" Booklet, HF zone tool, & Daily Weight Tracker Tool.  Patient has scale at home: Yes Patient has pill box at home: Yes    High Risk Criteria for Readmission and/or Poor Patient Outcomes: Heart failure hospital admissions (last 6 months): 1  No Show rate: 33 Difficult social situation: Hx: Polysubstance use (cocaine, alcohol and tobacco) and Transportation difficulties Demonstrates medication adherence: No Primary Language: English Literacy level: Reading, Writing  & Comprehension (12th grade)  Barriers of Care:   Diet & Fluid Restrictions Daily weights Medication Compliance Transportation Needs History of polysubstance use  Considerations/Referrals:   Referral made to Heart Failure Pharmacist Stewardship: Yes Referral made to Heart Failure CSW/NCM TOC: Yes.For transportation to HF Clinic follow-up appointment. Referral made to Heart & Vascular TOC clinic: Yes 07/19/23 @ 1:30 PM  Items for Follow-up on DC/TOC: Diet & Fluid Restrictions Daily weights Medication Compliance Continued Heart Failure Education- patient ws not as attentive for inpatient education   Roxy Horseman, RN, BSN [facility] Heart  Failure Navigator Secure Chat Only

## 2023-07-14 DIAGNOSIS — F1020 Alcohol dependence, uncomplicated: Secondary | ICD-10-CM | POA: Diagnosis not present

## 2023-07-14 DIAGNOSIS — I5023 Acute on chronic systolic (congestive) heart failure: Secondary | ICD-10-CM | POA: Diagnosis not present

## 2023-07-14 DIAGNOSIS — I4891 Unspecified atrial fibrillation: Secondary | ICD-10-CM | POA: Diagnosis not present

## 2023-07-14 DIAGNOSIS — I420 Dilated cardiomyopathy: Secondary | ICD-10-CM

## 2023-07-14 DIAGNOSIS — J9601 Acute respiratory failure with hypoxia: Secondary | ICD-10-CM

## 2023-07-14 LAB — BASIC METABOLIC PANEL
Anion gap: 9 (ref 5–15)
BUN: 17 mg/dL (ref 8–23)
CO2: 29 mmol/L (ref 22–32)
Calcium: 8.5 mg/dL — ABNORMAL LOW (ref 8.9–10.3)
Chloride: 94 mmol/L — ABNORMAL LOW (ref 98–111)
Creatinine, Ser: 0.98 mg/dL (ref 0.61–1.24)
GFR, Estimated: >60 mL/min (ref >60)
Glucose, Bld: 67 mg/dL — ABNORMAL LOW (ref 70–99)
Potassium: 3.6 mmol/L (ref 3.5–5.1)
Sodium: 132 mmol/L — ABNORMAL LOW (ref 135–145)

## 2023-07-14 LAB — ECHOCARDIOGRAM COMPLETE
AR max vel: 1.27 cm2
AV Area VTI: 1.5 cm2
AV Area mean vel: 1.26 cm2
AV Mean grad: 4.2 mm[Hg]
AV Peak grad: 9.6 mm[Hg]
Ao pk vel: 1.55 m/s
Height: 66 in
S' Lateral: 3.7 cm
Weight: 2737.23 [oz_av]

## 2023-07-14 LAB — PHOSPHORUS: Phosphorus: 3.1 mg/dL (ref 2.5–4.6)

## 2023-07-14 LAB — MAGNESIUM: Magnesium: 1.8 mg/dL (ref 1.7–2.4)

## 2023-07-14 MED ORDER — POTASSIUM CHLORIDE CRYS ER 20 MEQ PO TBCR
40.0000 meq | EXTENDED_RELEASE_TABLET | Freq: Two times a day (BID) | ORAL | Status: AC
  Administered 2023-07-14 (×2): 40 meq via ORAL
  Filled 2023-07-14 (×2): qty 2

## 2023-07-14 MED ORDER — MAGNESIUM SULFATE 2 GM/50ML IV SOLN
2.0000 g | Freq: Once | INTRAVENOUS | Status: AC
  Administered 2023-07-14: 2 g via INTRAVENOUS
  Filled 2023-07-14: qty 50

## 2023-07-14 MED ORDER — ATORVASTATIN CALCIUM 20 MG PO TABS
40.0000 mg | ORAL_TABLET | Freq: Every day | ORAL | Status: DC
  Administered 2023-07-14 – 2023-07-23 (×9): 40 mg via ORAL
  Filled 2023-07-14 (×10): qty 2

## 2023-07-14 NOTE — Progress Notes (Signed)
PROGRESS NOTE    Darren Rogers  ZOX:[id number] DOB: [birth year] DOA: 07/10/2023 PCP: Pcp, No  [id number]  LOS: 3 days   Brief hospital course:   Assessment & Plan: Darren Rogers is a 68 y.o. male with medical history significant of medication noncompliance, CAD, chronic A-fib on Eliquis and digoxin, HTN, HLD, chronic HFrEF with LVEF 35-40%, frequent PVCs, alcohol abuse and recurrent alcoholic pancreatitis presented with increasing shortness of breath, peripheral edema feeling malaise.  Patient drinks 48 ounces of beer twice a day, and not reported taking his medication occasionally.  on EMS arrival, pt found to have HR 190s-200s. Patient also lethargic for EMS.  Due to inability to obtain access, pt given IM versed and shocked by EMS at 200J.  Upon arriving to hospital, patient was found to have anasarca, chest x-ray showed cardiomegaly with pulmonary edema, significant elevated BNP.  Patient was started on diuretics for CHF exacerbation.   Acute respiratory failure with hypoxemia 2/2 Pulm edema Upon arriving the emergency room, patient had severe respiratory distress, oxygen saturation was as low as 78%, he was placed on a breather.  chest x-ray showed cardiomegaly with pulmonary edema.   --Pt has been weaned off supplemental oxygen after receiving diuresis.   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 Noncompliant with medical treatment. --presented with massive fluid overload.  current LVEF 30-35%.   --started on Lasix gtt.  Cardio consulted. --cont lasix gtt --cont Aldactone (new) --cont Toprol   Hypokalemia. --monitor and supplement PRN   Hyponatremia with hypervolemia. 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 Lactic acidosis secondary to liver failure. Alcohol dependence.   Single-vessel coronary disease. --cont statin   Paroxysmal atrial  fibrillation with RVR. --cont digoxin and Toprol --cont Eliquis   Nonsustained ventricular tachycardia. Patient had 13 beats of V. tach 2/10.  This is in the setting of acute congestive heart failure and electrolytes abnormality.   --cont tele   DVT prophylaxis: [id number] Code Status: Full code  Family Communication:  Level of care: Telemetry Cardiac Dispo:   The patient is from: home Anticipated d/c is to: home Anticipated d/c date is: 2-3 days   Subjective and Interval History:  Pt reported no dyspnea.  Confirmed good urine output.   Objective: Vitals:   07/13/23 1936 07/13/23 2320 07/14/23 0331 07/14/23 1151  BP: (!) 106/50 95/68 109/61 100/62  Pulse: 77 80 80 77  Resp: 18 16 18    Temp: 97.9 F (36.6 C) 97.8 F (36.6 C) 98 F (36.7 C) 98 F (36.7 C)  TempSrc: Oral Oral Oral   SpO2: 96% 98% 98% 99%  Weight:   75.8 kg   Height:        Intake/Output Summary (Last 24 hours) at 07/14/2023 1716 Last data filed at 07/14/2023 1057 Gross per 24 hour  Intake 781.61 ml  Output 4330 ml  Net -3548.39 ml   Filed Weights   07/12/23 0500 07/13/23 0355 07/14/23 0331  Weight: 75.9 kg 77.6 kg 75.8 kg    Examination:   Constitutional: NAD, alert HEENT: conjunctivae and lids normal, EOMI CV: No cyanosis.   RESP: normal respiratory effort, on RA Extremities: No pitting edema in BLE SKIN: warm, dry Neuro: II - XII grossly intact.   Psych: subdued mood and affect.     Data Reviewed: I have personally reviewed labs and imaging studies  Time spent: 50 minutes  Darlin Priestly, MD [facility] If 7PM-7AM, please contact  night-coverage 07/14/2023, 5:16 PM

## 2023-07-14 NOTE — Care Management Important Message (Signed)
Important Message  Patient Details  Name: Darren [place] MRN: [id number] Date of Birth: 09-21-1955   Important Message Given:  Yes - Medicare IM     Sherilyn Banker 07/14/2023, 10:07 AM

## 2023-07-14 NOTE — TOC Initial Note (Addendum)
Transition of Care [facility]) - Initial/Assessment Note    Patient Details  Name: Darren Rogers MRN: [id number] Date of Birth: 1955-07-15  Transition of Care [facility]) CM/SW Contact:    Truddie Hidden, RN Phone Number: 07/14/2023, 3:49 PM  Clinical Narrative:                  Admitted for: SOB r/t CHF Admitted from: Home. Lives with his sister  Ph[facility]acy: [facility] DME: [place], [place] PCP: none listed Transportation: May need transportation at discharge HH: not active, No preference of agency if needed  PCP list added to AVS   Expected Discharge Plan: Home/Self Care Barriers to Discharge: Continued Medical Work up   Patient Goals and CMS Choice Patient states their goals for this hospitalization and ongoing recovery are:: Home          Expected Discharge Plan and Services       Living arrangements for the past 2 months: Single Family Home                                      Prior Living Arrangements/Services Living arrangements for the past 2 months: Single Family Home Lives with:: Siblings Patient language and need for interpreter reviewed:: Yes Do you feel safe going back to the place where you live?: Yes      Need for Family Participation in Patient Care: Yes (Comment) Care giver support system in place?: Yes (comment)   Criminal Activity/Legal Involvement Pertinent to Current Situation/Hospitalization: No - Comment as needed  Activities of Daily Living   ADL Screening (condition at time of admission) Independently performs ADLs?: Yes (appropriate for developmental age) Is the patient deaf or have difficulty hearing?: No Does the patient have difficulty seeing, even when wearing glasses/contacts?: No Does the patient have difficulty concentrating, remembering, or making decisions?: No  Permission Sought/Granted                  Emotional Assessment Appearance:: Appears stated age Attitude/Demeanor/Rapport: Gracious, Engaged Affect  (typically observed): Accepting Orientation: : Oriented to Self, Oriented to Place, Oriented to  Time, Oriented to Situation Alcohol / Substance Use: Not Applicable Psych Involvement: No (comment)  Admission diagnosis:  CHF (congestive heart failure) (HCC) [I50.9] Acute respiratory failure with hypoxia (HCC) [J96.01] Atrial fibrillation with RVR (HCC) [I48.91] Acute on chronic systolic (congestive) heart failure (HCC) [I50.23] Acute on chronic congestive heart failure, unspecified heart failure type [facility]) [I50.9] Patient Active Problem List   Diagnosis Date Noted   Acute respiratory failure with hypoxia (HCC) 07/14/2023   Dilated cardiomyopathy (HCC) 07/14/2023   Alcoholic cirrhosis of liver with ascites (HCC) 07/11/2023   Anasarca 07/11/2023   Overweight (BMI 25.0-29.9) 07/11/2023   Acute on chronic HFrEF (heart failure with reduced ejection fraction) (HCC) 07/11/2023   CHF (congestive heart failure) (HCC) 07/10/2023   Chronic systolic CHF (congestive heart failure) (HCC) 03/22/2023   Myocardial injury 03/22/2023   Fall 03/22/2023   HLD (hyperlipidemia) 03/22/2023   Acute HFrEF (heart failure with reduced ejection fraction) (HCC) 12/23/2022   Elevated troponin 12/23/2022   Chronic atrial fibrillation with RVR (HCC) 08/06/2022   GERD without esophagitis 08/06/2022   Hyponatremia 08/06/2022   Hyperkalemia 08/06/2022   Atrial fibrillation, chronic (HCC) 05/19/2022   GERD (gastroesophageal reflux disease) 05/19/2022   AKI (acute kidney injury) (HCC) 03/10/2021   Lactic acidosis 03/10/2021   Acute encephalopathy 03/10/2021   Symptomatic bradycardia  03/09/2021   Abdominal pain, RUQ    Alcoholic pancreatitis 12/07/2020   Non-adherence to medical treatment 12/07/2020   Atrial fibrillation (HCC) 11/20/2020   Abscess of buccal space of mouth 11/19/2020   Dental abscess 11/19/2020   Facial cellulitis_left 11/19/2020   Cellulitis of face    Atrial fibrillation with RVR (HCC)  08/06/2020   Demand ischemia (HCC)    Polysubstance abuse (HCC)    Rapid atrial fibrillation (HCC) 08/05/2020   Alcohol use disorder, moderate, dependence (HCC) 08/05/2020   Cocaine abuse (HCC) 08/05/2020   Alcohol abuse 10/20/2019   Depression 10/20/2019   A-fib (HCC) 01/15/2017   Personal history of tobacco use, presenting hazards to health 08/17/2016   BPH associated with nocturia 07/30/2016   IFG (impaired fasting glucose) 07/30/2016   Other chest pain 08/22/2015   Essential hypertension    PAD (peripheral artery disease) (HCC)    Drug-induced erectile dysfunction 01/22/2015   CAD (coronary artery disease) 04/11/2014   CAD in native artery 04/11/2014   Paroxysmal atrial fibrillation (HCC)    NSTEMI (non-ST elevated myocardial infarction) (HCC) 01/29/2014   Tobacco abuse    Hyperlipidemia    Atrial fibrillation with rapid ventricular response (HCC) 01/27/2014   PCP:  Oneita Hurt, No Pharmacy:   [facility] [address] [place] [address] Phone: [phone] Fax: [phone]     Social Drivers of Health (SDOH) Social History: SDOH Screenings   Food Insecurity: No Food Insecurity (07/13/2023)  Housing: Low Risk  (07/13/2023)  Transportation Needs: Unmet Transportation Needs (07/13/2023)  Utilities: Not At Risk (07/11/2023)  Financial Resource Strain: Low Risk  (07/13/2023)  Social Connections: Moderately Isolated (07/11/2023)  Tobacco Use: High Risk (07/13/2023)   SDOH Interventions: Food Insecurity Interventions: Intervention Not Indicated Housing Interventions: Intervention Not Indicated Transportation Interventions: Intervention Not Indicated Financial Strain Interventions: Intervention Not Indicated   Readmission Risk Interventions    07/14/2023    3:49 PM 12/21/2022    9:13 AM  Readmission Risk Prevention Plan  Transportation Screening Complete Complete  PCP or Specialist Appt within 3-5 Days  Complete  HRI or Home  Care Consult  Complete  Social Work Consult for Recovery Care Planning/Counseling  Complete  Palliative Care Screening  Not Applicable  Medication Review Oceanographer) Complete Referral to Pharmacy  PCP or Specialist appointment within 3-5 days of discharge Complete   HRI or Home Care Consult Complete   SW Recovery Care/Counseling Consult Complete   Palliative Care Screening Not Applicable   Skilled Nursing Facility Complete

## 2023-07-14 NOTE — Discharge Instructions (Addendum)
Some PCP options in [place] area- not a comprehensive list  [facility]- [phone] [facility]- [phone] [facility]- [phone] [facility]- [phone] [facility]- [phone] Lutricia Horsfall- [phone]  or [facility] Physician Referral Line [phone]  Shelters Resource List  [facility] RESCUE MISSION PROVIDED BY: [facility] [address] [place], [place], [place] Offers a faith-based shelter for homeless men, usually with substance use disorders. Residents receive counseling, life skills training, and help finding a job.  HOMELESS SHELTER PROVIDED BY: [facility] [address] [place], [place], [place] Offers a shelter for men, women, and families experiencing homelessness. Food, clothing and other items are available for residents. Also offers support and services to help residents become self-sufficient. Offers temporary emergency housing for 30 days. Additional shelter may be available when temperatures drop below freezing but is not guaranteed.  FAMILY ABUSE SERVICES OF [facility] COUNTY PROVIDED BY: FAMILY ABUSE SERVICES OF [facility] [address], [place], [place] Offers services for victims of domestic violence. Offers a 24-hour crisis line and emergency shelter. Offers information and referrals to other community resources. Also offers court advocacy and support groups.  HOUSING CHOICE VOUCHER PROGRAM PROVIDED BY: [facility] [place] [address], [place], [place] Offers vouchers for approved Section 8 properties. Vouchers offer financial help with rent    [facility] PROVIDED BY: [facility], [place], [place] Offers emergency shelter for victims of domestic violence. Also offers a 24-hour crisis hotline for victims of domestic violence, safety planning, information and referrals, case management, and support groups for victims of domestic  violence.   ACT TOGETHER EMERGENCY SHELTER PROVIDED BY: [facility] [address], [place], [place] Offers a 21-day emergency shelter for youth experiencing a family crisis, abuse, or homelessness. Case management, supportive services, healthcare services, and more are available for residents. SHELTER PROVIDED BY: [facility] [address], [place], [place] Offers a homeless shelter for people and families. Meals, showers, community referrals, case management, and more are available for residents. HEARTH TRANSITIONAL LIVING PROGRAM PROVIDED BY: [facility] [address], [place], [place] Offers an 55-month homeless shelter for younger adults experiencing homelessness. Case management, independent living skills education, and more are available for residents. PARTNERSHIP VILLAGE PROVIDED BY: [facility] [address], [place], [place] Offers transitional housing for families and single people experiencing homelessness. Residents meet regularly with a case manager to work towards self-sufficiency TRANSITIONAL HOUSING PROVIDED BY: [facility] [address], [place], [place] Offers transitional housing for male veterans with disabilities. Residents receive meals, transportation, and clothing. Also offers support groups, nutrition classes, and peer support to residents.   WEAVER HOUSE PROVIDED BY: [facility] [address], [place], [place] Offers shelter to adult men and women. Guests receive hot meals and case management. Also offers overnight shelter when temperatures drop during cold winter months  EMERGENCY FAMILY SHELTER PROVIDED BY: [facility] [address], , [place] Offers shelter and support services for families experiencing homelessness.   Rent/Utility/Housing  Agency Name: [facility] Agency Address: [address] [place], [place] [address] Phone:  [phone] Email: [email or web address] Website: [email or web address] Service(s) Offered: Housing services, self-sufficiency, congregate meal program, weatherization program, Field seismologist program, emergency food assistance,  housing counseling, home ownership program, wheels -[place] program.  Agency Name: [facility] Address: [address] [place], [place] [address] Phone: [phone] (8a-4p) [phone] (8p- 10p) Email: [email or web address] Website: [email or web address] Service(s) Offered: A program for homeless and/or needy men that includes  one-on-one counseling, life skills training and job rehabilitation.  Agency Name: [facility] Address: [address] [place], [place] [address] Phone: [phone] Website: www.alliedchurches.org Service(s) Offered: Assistance to needy in emergency with utility bills, heating fuel, and prescriptions. Shelter for homeless 7pm-7am. September 24, 2016 15  Agency Name: [facility] [place] (Developmentally Disabled) Address: [address], [place], [place] [address] Phone: [phone] Contact Person: Cathleen Corti Email: [email or web address] Website: [email or web address] Service(s) Offered: Helps individuals with developmental disabilities move from housing that is more restrictive to homes where they  can achieve greater independence and have more  opportunities.  Agency Name: [facility] Address: [address], [place], [place] [address] Phone: [phone] Email: [email or web address] Website: www.[email or web address].org Service(s) Offered: Provides affordable housing for low-income families, elderly, and disabled individuals. Offer a wide range of  programs and services, from financial planning to afterschool and summer programs.  Agency Name: [facility] Address: [address] [place], [place]  [address] Phone: [phone] Service(s) Offered: Child support services; child welfare services; food stamps; Medicaid; work first family assistance; and aid with fuel,  rent, food and medicine.  Agency Name: [facility] [place], [employer]. Address: [facility] [address] [place], [place]  [address] Phone: [phone] Website: www.familyabuseservices.org Service(s) Offered: 24 hour Crisis Line: [phone]; 24 hour Emergency Shelter; Transitional Housing; Support Groups; Scientist, physiological; [employer]; Hispanic Outreach: [phone];  Visitation Center: [phone].  Agency Name: [facility] [place]. Address: [address], [place], [place] [address] Phone: [phone] Service(s) Offered: CAP Services; Home and [employer]; Individual or Group Supports; Respite Care Non-Institutional Nursing;  Residential Supports; Respite Care and Personal Care Services; Transportation; Family and Friends Night; Recreational Activities; Three Nutritious Meals/Snacks; Consultation with Registered Dietician; Twenty-four hour Registered Nurse Access; Daily and [employer]; Camp Green Leaves; [place] for the [employer] (During Summer Months) Bingo Night (Every  Wednesday Night); Special Populations Dance Night  (Every Tuesday Night); Professional Hair Care Services.  Agency Name: [facility] Address: [address], [place], [place] [address] Phone: [phone] Contact Person: Jabier Mutton Website: [email or web address].Physicist, medical) Offered: Residential treatment facility for women; food and  clothing, educational & employment development and  transportation to work; Counsellor of financial skills;  parenting and family reunification; emotional and spiritual  support; transitional housing for program graduates.  Agency Name: [facility] Address: [address] [place], [place]  [address] Phone: [phone] Email: [email or web address] Website: [email or web address].es Service(s) Offered: Public housing units for elderly, disabled, and low income people; housing choice vouchers for income eligible  applicants; shelter plus care vouchers; and Psychologist, clinical.  Agency Name: [facility] [employer] Address: [address] [place], [place] [address] Phone: [phone] Email: [email or web address] Website: www.habitatalamance.org Service(s) Offered: Build houses for families in need of decent housing. Each adult in the family must invest 200 hours of labor on  someone else's house, work with volunteers to build their own house, attend classes on budgeting, home maintenance, yard care, and attend homeowner association meetings.  Agency Name: [facility] Address: [address], [place], [place] [address] Phone: [phone] Website: www.rsli.org Service(s) Offered: Intermediate care facilities for intellectually delayed, Supervised Living in group homes for adults with developmental disabilities, Supervised Living for people who have dual diagnoses (MRMI), Independent Living, Supported Living, respite and a variety of CAP services, pre-vocational services, day supports, and [employer].  Agency Name: [place] [employer] Phone:  [phone] Website: www.NCForeclosurePrevention.gov Service(s) Offered: Zero-interest, deferred loans to homeowners struggling to pay their mortgage. Call for more information.

## 2023-07-14 NOTE — Progress Notes (Signed)
Rounding Note    Patient Name: Darren Rogers Date of Encounter: 07/14/2023  [facility] Cardiologist: Lorine Bears, MD   Subjective   UOP -4.7L. kidney function stable. Patient still volume up. He reports trouble sleeping due to uncomfortable IV.   Inpatient Medications    Scheduled Meds:  apixaban  5 mg Oral BID   digoxin  0.125 mg Oral Daily   folic acid  1 mg Oral Daily   metoprolol succinate  12.5 mg Oral Daily   multivitamin with minerals  1 tablet Oral Daily   nicotine  21 mg Transdermal Daily   pantoprazole  40 mg Oral Daily   QUEtiapine  25 mg Oral QHS   sodium chloride flush  3 mL Intravenous Q12H   spironolactone  12.5 mg Oral Daily   thiamine  100 mg Oral Daily   Or   thiamine  100 mg Intravenous Daily   Continuous Infusions:  furosemide (LASIX) 200 mg in dextrose 5 % 100 mL (2 mg/mL) infusion 8 mg/hr (07/13/23 2330)   PRN Meds: acetaminophen, ondansetron (ZOFRAN) IV, polyethylene glycol, sodium chloride flush   Vital Signs    Vitals:   07/13/23 1552 07/13/23 1936 07/13/23 2320 07/14/23 0331  BP: 118/69 (!) 106/50 95/68 109/61  Pulse: 79 77 80 80  Resp: 18 18 16 18   Temp: 98.7 F (37.1 C) 97.9 F (36.6 C) 97.8 F (36.6 C) 98 F (36.7 C)  TempSrc:  Oral Oral Oral  SpO2: 100% 96% 98% 98%  Weight:    75.8 kg  Height:        Intake/Output Summary (Last 24 hours) at 07/14/2023 0801 Last data filed at 07/13/2023 2330 Gross per 24 hour  Intake 781.61 ml  Output 5581 ml  Net -4799.39 ml      07/14/2023    3:31 AM 07/13/2023    3:55 AM 07/12/2023    5:00 AM  Last 3 Weights  Weight (lbs) 167 lb 1.7 oz 171 lb 1.2 oz 167 lb 5.3 oz  Weight (kg) 75.8 kg 77.6 kg 75.9 kg      Telemetry    NSR, PACs, PVCs, brief runs of NSVT. Longest 6 beats - Personally Reviewed  ECG    No new - Personally Reviewed  Physical Exam   GEN: No acute distress.   Neck: No JVD Cardiac: RRR, no murmurs, rubs, or gallops.  Respiratory: Clear to  auscultation bilaterally. GI: Soft, nontender, non-distended  MS: 2+ lower leg edema; No deformity. Neuro:  Nonfocal  Psych: Normal affect   Labs    High Sensitivity Troponin:   Recent Labs  Lab 07/10/23 1548 07/10/23 1552  TROPONINIHS 12 12     Chemistry Recent Labs  Lab 07/10/23 1552 07/11/23 0430 07/12/23 0340 07/13/23 0231 07/14/23 0300  NA 125* 127* 132* 130* 132*  K 4.2 3.8 3.4* 4.0 3.6  CL 92* 95* 95* 97* 94*  CO2 17* 20* 26 23 29   GLUCOSE 95 111* 88 107* 67*  BUN 13 15 15 21 17   CREATININE 0.86 0.86 1.02 1.00 0.98  CALCIUM 8.7* 8.6* 8.4* 8.6* 8.5*  MG 2.0  --  1.9 1.9 1.8  PROT 6.5 5.8* 5.8*  --   --   ALBUMIN 3.3* 3.0* 3.0*  --   --   AST 65* 50* 45*  --   --   ALT 35 32 28  --   --   ALKPHOS 165* 140* 132*  --   --   BILITOT 4.9*  4.3* 3.3*  --   --   GFRNONAA >60 >60 >60 >60 >60  ANIONGAP 16* 12 11 10 9     Lipids No results for input(s): "CHOL", "TRIG", "HDL", "LABVLDL", "LDLCALC", "CHOLHDL" in the last 168 hours.  Hematology Recent Labs  Lab 07/10/23 1548  WBC 4.5  RBC 3.87*  HGB 12.7*  HCT 35.8*  MCV 92.5  MCH 32.8  MCHC 35.5  RDW 17.2*  PLT 175   Thyroid  Recent Labs  Lab 07/10/23 1552  TSH 3.879    BNP Recent Labs  Lab 07/10/23 1548  BNP 899.4*    DDimer No results for input(s): "DDIMER" in the last 168 hours.   Radiology    ECHOCARDIOGRAM COMPLETE Result Date: 07/14/2023    ECHOCARDIOGRAM REPORT   Patient Name:   Darren Rogers Date of Exam: 07/13/2023 Medical Rec #:  [id number]    Height:       66.0 in Accession #:    [id number]   Weight:       171.1 lb Date of Birth:  21-Dec-1955   BSA:          1.871 m Patient Age:    68 years     BP:           91/74 mmHg Patient Gender: M            HR:           82 bpm. Exam Location:  [facility] Procedure: 2D Echo, Cardiac Doppler, Color Doppler and Intracardiac            Opacification Agent (Both Spectral and Color Flow Doppler were            utilized during procedure). Indications:     I50.9  Congestive heart failure.  History:         Patient has prior history of Echocardiogram examinations, most                  recent 12/21/2022. CHF, CAD, Arrythmias:PVC and Atrial                  Fibrillation; Risk Factors:Hypertension, Dyslipidemia and                  Current Smoker.  Sonographer:     Daphine Deutscher RDCS Referring Phys:  [id number] Francee Nodal Rickard Kennerly Diagnosing Phys: Cristal Deer End MD IMPRESSIONS  1. Left ventricular ejection fraction, by estimation, is 30 to 35%. The left ventricle has moderately decreased function. The left ventricle demonstrates global hypokinesis. Left ventricular diastolic parameters are indeterminate.  2. Right ventricular systolic function is moderately reduced. The right ventricular size is mildly enlarged. There is mildly elevated pulmonary artery systolic pressure.  3. Right atrial size was moderately dilated.  4. The mitral valve is normal in structure. Moderate mitral valve regurgitation. No evidence of mitral stenosis.  5. The tricuspid valve is degenerative. Tricuspid valve regurgitation is severe.  6. The aortic valve is tricuspid. Aortic valve regurgitation is mild. No aortic stenosis is present.  7. The inferior vena cava is dilated in size with <50% respiratory variability, suggesting right atrial pressure of 15 mmHg. FINDINGS  Left Ventricle: Left ventricular ejection fraction, by estimation, is 30 to 35%. The left ventricle has moderately decreased function. The left ventricle demonstrates global hypokinesis. Definity contrast agent was given IV to delineate the left ventricular endocardial borders. Strain imaging was not performed. The left ventricular internal cavity size was normal in size. There is no left  ventricular hypertrophy. Left ventricular diastolic parameters are indeterminate. Right Ventricle: The right ventricular size is mildly enlarged. No increase in right ventricular wall thickness. Right ventricular systolic function is moderately reduced.  There is mildly elevated pulmonary artery systolic pressure. The tricuspid regurgitant velocity is 2.71 m/s, and with an assumed right atrial pressure of 15 mmHg, the estimated right ventricular systolic pressure is 44.4 mmHg. Left Atrium: Left atrial size was normal in size. Right Atrium: Right atrial size was moderately dilated. Pericardium: There is no evidence of pericardial effusion. Mitral Valve: The mitral valve is normal in structure. Moderate mitral valve regurgitation. No evidence of mitral valve stenosis. Tricuspid Valve: The tricuspid valve is degenerative in appearance. Tricuspid valve regurgitation is severe. Aortic Valve: The aortic valve is tricuspid. Aortic valve regurgitation is mild. No aortic stenosis is present. Aortic valve mean gradient measures 4.2 mmHg. Aortic valve peak gradient measures 9.6 mmHg. Aortic valve area, by VTI measures 1.50 cm. Pulmonic Valve: The pulmonic valve was normal in structure. Pulmonic valve regurgitation is mild. No evidence of pulmonic stenosis. Aorta: The aortic root and ascending aorta are structurally normal, with no evidence of dilitation. Pulmonary Artery: The pulmonary artery is of normal size. Venous: The inferior vena cava is dilated in size with less than 50% respiratory variability, suggesting right atrial pressure of 15 mmHg. IAS/Shunts: The interatrial septum was not well visualized. Additional Comments: 3D imaging was not performed.  LEFT VENTRICLE PLAX 2D LVIDd:         4.70 cm LVIDs:         3.70 cm LV PW:         0.90 cm LV IVS:        0.80 cm LVOT diam:     1.90 cm LV SV:         31 LV SV Index:   17 LVOT Area:     2.84 cm  RIGHT VENTRICLE             IVC RV Basal diam:  4.60 cm     IVC diam: 2.30 cm RV S prime:     11.70 cm/s TAPSE (M-mode): 1.5 cm LEFT ATRIUM             Index        RIGHT ATRIUM           Index LA diam:        4.60 cm 2.46 cm/m   RA Area:     28.60 cm LA Vol (A2C):   60.5 ml 32.33 ml/m  RA Volume:   108.00 ml 57.71 ml/m LA  Vol (A4C):   49.1 ml 26.24 ml/m LA Biplane Vol: 57.7 ml 30.83 ml/m  AORTIC VALVE AV Area (Vmax):    1.27 cm AV Area (Vmean):   1.26 cm AV Area (VTI):     1.50 cm AV Vmax:           155.27 cm/s AV Vmean:          93.684 cm/s AV VTI:            0.210 m AV Peak Grad:      9.6 mmHg AV Mean Grad:      4.2 mmHg LVOT Vmax:         69.53 cm/s LVOT Vmean:        41.567 cm/s LVOT VTI:          0.111 m LVOT/AV VTI ratio: 0.53  AORTA Ao Root diam: 3.50 cm Ao Asc  diam:  3.40 cm MV E velocity: 98.65 cm/s  TRICUSPID VALVE                            TR Peak grad:   29.4 mmHg                            TR Vmax:        271.00 cm/s                             SHUNTS                            Systemic VTI:  0.11 m                            Systemic Diam: 1.90 cm Yvonne Kendall MD Electronically signed by Yvonne Kendall MD Signature Date/Time: 07/14/2023/7:45:50 AM    Final     Cardiac Studies   12/23/2022 L/RHC Severe single-vessel coronary artery disease with chronic total occlusion of distal LCx/LPLA.  LPDA is supplied by left-to-left collaterals.  There is mild-moderate, non-obstructive disease involving the LAD. Upper normal to mildly elevated left and right heart filling pressures (LVEDP 17 mmHg, PCWP 18 mmHg, RA 7 mmHg). Moderate pulmonary hypertension (mean PA 37 mmHg, PVR 5 WU) Mildly reduced Fick cardiac output/index (CO 3.8 L/min, CI 2.1 L/min/m^2).   12/21/2022 TTE 1. Left ventricular ejection fraction, by estimation, is 35 to 40%. The  left ventricle has moderately decreased function. The left ventricle  demonstrates global hypokinesis. There is mild left ventricular  hypertrophy. Left ventricular diastolic  parameters are indeterminate.   2. Right ventricular systolic function is normal. The right ventricular  size is normal. There is moderately elevated pulmonary artery systolic  pressure. The estimated right ventricular systolic pressure is 53.4 mmHg.   3. Left atrial size was mildly dilated.    4. Right atrial size was mildly dilated.   5. The mitral valve is normal in structure. Mild to moderate mitral valve  regurgitation. No evidence of mitral stenosis.   6. Tricuspid valve regurgitation is moderate.   7. The aortic valve is normal in structure. Aortic valve regurgitation is  mild. No aortic stenosis is present.   8. Pulmonic valve regurgitation is moderate.   9. Aortic dilatation noted. There is mild dilatation of the aortic root,  measuring 42 mm.  10. Mildly dilated pulmonary artery.  11. The inferior vena cava is normal in size with <50% respiratory  variability, suggesting right atrial pressure of 8 mmHg.     Patient Profile     68 y.o. male with a hx of medication noncompliance, CAD, chronic atrial fibrillation on Eliquis and digoxin, HTN, HLD, chronic HFrEF with LVEF 35-40%, frequent PVCs, alcohol abuse, and recurrent alcoholic pancreatitis who is being seen 07/12/2023 for the evaluation of CHF exacerbation and NSVT   Assessment & Plan    Acute on chronic systolic heart failure Alcohol induced CM Anasarca Noncompliant with medical treatment - history of CHF since 11/2022. Echo at that time showed LVEF 35-40%. He has h/o medication non-compliance - he presented 2/8 with DOE and LLE started on lasix drip - lasix to 8mg /hr - Net -10.1L - continue spironolactone 12.4mg  daily - kidney function stable - repeat echo  showed LVEF 30-35%, global HK, moderately reduced RVSF, moderately dilated RA, mod MR, mild AI - low BP limiting GDMT - continue PTA digoxin 0.125mg  daily. Dig level 0.3   Paroxysmal Afib with RVR - longstanding h/o Afib, noncompliant with medications - upon arrival to the ED he was in sinus tachycardia - keep K>4 and Mag>2 - Tsh wnl - tele shows NSR with frequent ectopy - continue Eliquia 5mg  BID for strok eppx   NSVT - he has known CAD with no targets for revascularization on most recent cath - brief runs of NSVT on tele, longest 6 beats -  continue metoprolol   CAD - LHC 11/2022 showed severe sigle vessel CAD with good collaterals. 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

## 2023-07-14 NOTE — Progress Notes (Signed)
Heart Failure Stewardship Pharmacy Note  PCP: Pcp, No PCP-Cardiologist: Lorine Bears, MD  HPI: Darren Rogers is a 68 y.o. male with former polysubstance use (cocaine, alcohol, tobacco), recurrent alcoholic pancreatitis, A fib on Eliquis and digoxin, HTN, HLD, depression, CAD, PAD with claudication, BPH, PVC who presented with heart racing, fall, and mild shortness of breath. Patient had been nonadherent to HF and AF medications including Eliquis prior to admission. On admission, BNP was 899.4, HS-troponin was 12, lactic acid was 2.6, digoxin level was <0.2, TSH was 3.879, ethanol was negative, and UDS was negative. Chest x-ray noted perihilar vascular congestion, mild lower zonal interstitial edema and small pleural effusions.    Pertinent cardiac history: TTE in 05/2015 showed LEF 60-65%. Moderate blockage noted on arterial doppler in 06/2015. Stress test in 06/2015 showed T wave inversion in I, II, III, V4, V5, V6 during stress. A 2% PVC burden was noted 04/2017 via long term event monitor. TTE 07/2020 showed LVEF 50-55%. TTE 20/2022 showed LVEF 55-60% and low normal RV function. Event monitor 04/2021 showed 100% AF burden with rate of 99. TTE 11/2022 showed LVEF down to 35-40%, normal RV, mild-moderate MR, moderate TR, mild AR, and moderate PR. LHC 11/2022 showed severe single-vessel disease with CTO of Lcx/LPLA with left-to-left collaterals. During that time PVR was 5, PCWP was 18, and CI was 2.1.   Pertinent Lab Values: Creatinine  Date Value Ref Range Status  09/21/2014 1.10 mg/dL Final    Comment:    1.47-8.29 NOTE: New Reference Range  08/07/14    Creatinine, Ser  Date Value Ref Range Status  07/14/2023 0.98 0.61 - 1.24 mg/dL Final   BUN  Date Value Ref Range Status  07/14/2023 17 8 - 23 mg/dL Final  56/21/3086 9 8 - 27 mg/dL Final  57/84/6962 15 mg/dL Final    Comment:    9-52 NOTE: New Reference Range  08/07/14    Potassium  Date Value Ref Range Status  07/14/2023 3.6  3.5 - 5.1 mmol/L Final  09/21/2014 3.5 mmol/L Final    Comment:    3.5-5.1 NOTE: New Reference Range  08/07/14    Sodium  Date Value Ref Range Status  07/14/2023 132 (L) 135 - 145 mmol/L Final  08/26/2021 140 134 - 144 mmol/L Final  09/21/2014 139 mmol/L Final    Comment:    135-145 NOTE: New Reference Range  08/07/14    B Natriuretic Peptide  Date Value Ref Range Status  07/10/2023 899.4 (H) 0.0 - 100.0 pg/mL Final    Comment:    Performed at [facility], [address] [place], [place] [address]   Magnesium  Date Value Ref Range Status  07/14/2023 1.8 1.7 - 2.4 mg/dL Final    Comment:    Performed at [facility], [address] [place], [place] [address]  09/21/2014 1.8 mg/dL Final    Comment:    2.7-2.5 THERAPEUTIC RANGE: 4-7 mg/dL TOXIC: > 10 mg/dL  ----------------------- NOTE: New Reference Range  08/07/14    Hemoglobin A1C  Date Value Ref Range Status  03/28/2014 6.4 (H) 4.2 - 6.3 % Final    Comment:    The American Diabetes Association recommends that a primary goal of therapy should be <7% and that physicians should reevaluate the treatment regimen in patients with HbA1c values consistently >8%.    HB A1C (BAYER DCA - WAIVED)  Date Value Ref Range Status  07/30/2016 6.1 <7.0 % Final    Comment:  Diabetic Adult            <7.0                                       Healthy Adult        4.3 - 5.7                                                           (DCCT/NGSP) American Diabetes Association's Summary of Glycemic Recommendations for Adults with Diabetes: Hemoglobin A1c <7.0%. More stringent glycemic goals (A1c <6.0%) may further reduce complications at the cost of increased risk of hypoglycemia.    Hgb A1c MFr Bld  Date Value Ref Range Status  03/22/2023 5.5 4.8 - 5.6 % Final    Comment:    (NOTE) Pre diabetes:          5.7%-6.4%  Diabetes:              >6.4%  Glycemic  control for   <7.0% adults with diabetes    Digoxin Level  Date Value Ref Range Status  07/13/2023 0.3 (L) 0.8 - 2.0 ng/mL Final    Comment:    Performed at [facility], [address] [place], [place] [address]   TSH  Date Value Ref Range Status  07/10/2023 3.879 0.350 - 4.500 uIU/mL Final    Comment:    Performed by a 3rd Generation assay with a functional sensitivity of <=0.01 uIU/mL. Performed at [facility], [address] [place], [place] [address]   08/26/2021 1.600 0.450 - 4.500 uIU/mL Final    Vital Signs: Admission weight: 184.7 lbs Temp:  [97.8 F (36.6 C)-98.8 F (37.1 C)] 98 F (36.7 C) (02/12 0331) Pulse Rate:  [77-80] 80 (02/12 0331) Cardiac Rhythm: Normal sinus rhythm (02/12 0755) Resp:  [16-19] 18 (02/12 0331) BP: (95-118)/(50-78) 109/61 (02/12 0331) SpO2:  [96 %-100 %] 98 % (02/12 0331) Weight:  [75.8 kg (167 lb 1.7 oz)] 75.8 kg (167 lb 1.7 oz) (02/12 0331)  Intake/Output Summary (Last 24 hours) at 07/14/2023 0981 Last data filed at 07/14/2023 1914 Gross per 24 hour  Intake 781.61 ml  Output 6331 ml  Net -5549.39 ml    Current Heart Failure Medications:  Loop diuretic: Furosemide 8 mg/hr Beta-Blocker: Metoprolol succinate 12.5 mg daily  ACEI/ARB/ARNI: none MRA: spironolactone 12.5 mg daily SGLT2i: none Other: Digoxin 0.125 mg daily   Prior to admission Heart Failure Medications:  Patient reported filling medications at [facility], however, after speaking with them on the phone, there have been no prescriptions filled in the last year. After discussing this with the patient he admits to being off medications for a very long time.  Assessment: 1. 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 NYHA class III symptoms.  -Symptoms: Significant improvement in shortness of breath and orthopnea. LEE still present. Still reports fatigue/drowsiness. Reports  appetite is back to normal. Lethargy continuing to improve.. -Volume: Still hypervolemic on exam. Excellent urine output on 8 mg/hr. Creatinine and BUN are stable. Weight is down 4 lbs from yesterday. Urine color is still light. Continue current infusion rate. May benefit from TED  hose for LEE. -Hemodynamics: BP is low-stable and HR is stable from 70-80s. -BB: Patient prescribed high dose beta blocker PTA, however had stopped it for several months. Metoprolol was restarted at home dose and contributed to lethargy. Now on metoprolol 12.5 mg daily and doing much better. -ACEI/ARB/ARNI: Sherryll Burger was stopped for soft BP. Unable to re-start at this time given persistently low BP. -MRA: On low dose spironolactone given BP. K is low. Can consider increasing prior to discharge pending BP.  -SGLT2i: Consider starting Farxiga 10 mg daily when external catheter is removed given minimal effect on BP. Empulse trial and to a lesser extent Dictate AHF trial show benefits of inpatient SGLT2i initiation.  Plan: 1) Medication changes recommended at this time: -Consider starting Farxiga 10 mg daily when catheter removed  2) Patient assistance: -Copay for Sherryll Burger, Farxiga, Jardiance, Eliquis, and Xarelto are $0 -Patient would like to receive medications via [facility] mail order pharmacy in the future for convenience.  3) Education: - Patient has been educated on current HF medications and potential additions to HF medication regimen - Patient verbalizes understanding that over the next few months, these medication doses may change and more medications may be added to optimize HF regimen - Patient has been educated on basic disease state pathophysiology and goals of therapy  Medication Assistance / Insurance Benefits Check: Does the patient have prescription insurance?    Type of insurance plan:  Does the patient qualify for medication assistance through manufacturers or grants? Pending  Eligible grants  and/or patient assistance programs: Pending  Medication assistance applications in progress: Pending  Medication assistance applications approved: Pending Approved medication assistance renewals will be completed by: Pending  Outpatient Pharmacy: Prior to admission outpatient pharmacy: Not filling medications PTA Is the patient agreeable to switch to [facility]?: Yes Is the patient willing to utilize a [facility] pharmacy at discharge?: Yes  Please do not hesitate to reach out with questions or concerns,  Enos Fling, PharmD, CPP, BCPS Heart Failure Pharmacist  Phone - [phone] 07/14/2023 9:17 AM

## 2023-07-15 DIAGNOSIS — I5043 Acute on chronic combined systolic (congestive) and diastolic (congestive) heart failure: Secondary | ICD-10-CM | POA: Diagnosis not present

## 2023-07-15 DIAGNOSIS — I5023 Acute on chronic systolic (congestive) heart failure: Secondary | ICD-10-CM | POA: Diagnosis not present

## 2023-07-15 DIAGNOSIS — I4891 Unspecified atrial fibrillation: Secondary | ICD-10-CM | POA: Diagnosis not present

## 2023-07-15 DIAGNOSIS — F1020 Alcohol dependence, uncomplicated: Secondary | ICD-10-CM | POA: Diagnosis not present

## 2023-07-15 DIAGNOSIS — I420 Dilated cardiomyopathy: Secondary | ICD-10-CM | POA: Diagnosis not present

## 2023-07-15 LAB — RENAL FUNCTION PANEL
Albumin: 3 g/dL — ABNORMAL LOW (ref 3.5–5.0)
Anion gap: 9 (ref 5–15)
BUN: 15 mg/dL (ref 8–23)
CO2: 30 mmol/L (ref 22–32)
Calcium: 8.8 mg/dL — ABNORMAL LOW (ref 8.9–10.3)
Chloride: 93 mmol/L — ABNORMAL LOW (ref 98–111)
Creatinine, Ser: 1.03 mg/dL (ref 0.61–1.24)
GFR, Estimated: >60 mL/min (ref >60)
Glucose, Bld: 104 mg/dL — ABNORMAL HIGH (ref 70–99)
Phosphorus: 2.4 mg/dL — ABNORMAL LOW (ref 2.5–4.6)
Potassium: 3.6 mmol/L (ref 3.5–5.1)
Sodium: 132 mmol/L — ABNORMAL LOW (ref 135–145)

## 2023-07-15 LAB — CULTURE, BLOOD (ROUTINE X 2)
Culture: NO GROWTH
Culture: NO GROWTH

## 2023-07-15 LAB — DIGOXIN LEVEL: Digoxin Level: 0.6 ng/mL — ABNORMAL LOW (ref 0.8–2.0)

## 2023-07-15 LAB — MAGNESIUM: Magnesium: 2 mg/dL (ref 1.7–2.4)

## 2023-07-15 MED ORDER — POTASSIUM CHLORIDE CRYS ER 20 MEQ PO TBCR
40.0000 meq | EXTENDED_RELEASE_TABLET | Freq: Two times a day (BID) | ORAL | Status: AC
  Administered 2023-07-15 (×2): 40 meq via ORAL
  Filled 2023-07-15 (×2): qty 2

## 2023-07-15 MED ORDER — DIGOXIN 125 MCG PO TABS
0.0625 mg | ORAL_TABLET | Freq: Every day | ORAL | Status: DC
  Administered 2023-07-16 – 2023-07-23 (×7): 0.0625 mg via ORAL
  Filled 2023-07-15 (×8): qty 0.5

## 2023-07-15 MED ORDER — FUROSEMIDE 10 MG/ML IJ SOLN
60.0000 mg | Freq: Three times a day (TID) | INTRAMUSCULAR | Status: DC
  Administered 2023-07-15 – 2023-07-16 (×2): 60 mg via INTRAVENOUS
  Filled 2023-07-15 (×2): qty 6

## 2023-07-15 MED ORDER — FUROSEMIDE 10 MG/ML IJ SOLN: 60.0000 mg | Freq: Three times a day (TID) | INTRAMUSCULAR | Status: DC

## 2023-07-15 NOTE — Progress Notes (Signed)
PROGRESS NOTE    Darren Rogers  ZOX:[id number] DOB: April 26, 1956 DOA: 07/10/2023 PCP: Pcp, No  [id number]  LOS: 4 days   Brief hospital course:   Assessment & Plan: Darren Rogers is a 68 y.o. male with medical history significant of medication noncompliance, CAD, chronic A-fib on Eliquis and digoxin, HTN, HLD, chronic HFrEF with LVEF 35-40%, frequent PVCs, alcohol abuse and recurrent alcoholic pancreatitis presented with increasing shortness of breath, peripheral edema feeling malaise.  Patient drinks 48 ounces of beer twice a day, and not reported taking his medication occasionally.  on EMS arrival, pt found to have HR 190s-200s. Patient also lethargic for EMS.  Due to inability to obtain access, pt given IM versed and shocked by EMS at 200J.  Upon arriving to hospital, patient was found to have anasarca, chest x-ray showed cardiomegaly with pulmonary edema, significant elevated BNP.  Patient was started on diuretics for CHF exacerbation.   Acute respiratory failure with hypoxemia 2/2 Pulm edema Upon arriving the emergency room, patient had severe respiratory distress, oxygen saturation was as low as 78%, he was placed on a breather.  chest x-ray showed cardiomegaly with pulmonary edema.   --Pt has been weaned off supplemental oxygen after receiving diuresis.   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 Noncompliant with medical treatment. --presented with massive fluid overload.  current LVEF 30-35%.   --started on Lasix gtt.  Cardio consulted. Plan: --Continue Lasix infusion until this evening then transition to Lasix 60 IV every 8 hours  --cont Aldactone (new) --cont Toprol --hold Entresto   Hypokalemia. --monitor and supplement PRN   Hyponatremia with hypervolemia. 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 Lactic acidosis secondary to liver  failure. Alcohol dependence.   Single-vessel coronary disease. --cont statin --on Eliquis in place of ASA   Paroxysmal atrial fibrillation with RVR. --cont digoxin and Toprol --cont Eliquis   Nonsustained ventricular tachycardia. Patient had 13 beats of V. tach 2/10.  This is in the setting of acute congestive heart failure and electrolytes abnormality.   --cont Toprol   DVT prophylaxis: WJ:[id number] Code Status: Full code  Family Communication:  Level of care: Telemetry Cardiac Dispo:   The patient is from: home Anticipated d/c is to: home Anticipated d/c date is: 1-2 days   Subjective and Interval History:  Pt reported good urine output.   Objective: Vitals:   07/15/23 0530 07/15/23 0814 07/15/23 1115 07/15/23 1539  BP:  103/69 92/61 104/67  Pulse:  85 79 78  Resp:      Temp:  98.6 F (37 C) 98.5 F (36.9 C) 97.9 F (36.6 C)  TempSrc:  Oral Oral   SpO2:  99% 94% 99%  Weight: 68.6 kg     Height:        Intake/Output Summary (Last 24 hours) at 07/15/2023 1732 Last data filed at 07/15/2023 1442 Gross per 24 hour  Intake 971.81 ml  Output 4300 ml  Net -3328.19 ml   Filed Weights   07/13/23 0355 07/14/23 0331 07/15/23 0530  Weight: 77.6 kg 75.8 kg 68.6 kg    Examination:   Constitutional: NAD, AAOx3 HEENT: conjunctivae and lids normal, EOMI CV: No cyanosis.   RESP: normal respiratory effort, on RA Extremities: improved edema in BLE SKIN: warm, dry Neuro: II - XII grossly intact.     Data Reviewed: I have personally reviewed labs and imaging studies  Time spent: 35 minutes  Darlin Priestly, MD  [facility] If 7PM-7AM, please contact night-coverage 07/15/2023, 5:32 PM

## 2023-07-15 NOTE — Progress Notes (Signed)
Heart Failure Stewardship Pharmacy Note  PCP: Pcp, No PCP-Cardiologist: Lorine Bears, MD  HPI: Darren Rogers is a 68 y.o. male with former polysubstance use (cocaine, alcohol, tobacco), recurrent alcoholic pancreatitis, A fib on Eliquis and digoxin, HTN, HLD, depression, CAD, PAD with claudication, BPH, PVC who presented with heart racing, fall, and mild shortness of breath. Patient had been nonadherent to HF and AF medications including Eliquis prior to admission. On admission, BNP was 899.4, HS-troponin was 12, lactic acid was 2.6, digoxin level was <0.2, TSH was 3.879, ethanol was negative, and UDS was negative. Chest x-ray noted perihilar vascular congestion, mild lower zonal interstitial edema and small pleural effusions.    Pertinent cardiac history: TTE in 05/2015 showed LEF 60-65%. Moderate blockage noted on arterial doppler in 06/2015. Stress test in 06/2015 showed T wave inversion in I, II, III, V4, V5, V6 during stress. A 2% PVC burden was noted 04/2017 via long term event monitor. TTE 07/2020 showed LVEF 50-55%. TTE 20/2022 showed LVEF 55-60% and low normal RV function. Event monitor 04/2021 showed 100% AF burden with rate of 99. TTE 11/2022 showed LVEF down to 35-40%, normal RV, mild-moderate MR, moderate TR, mild AR, and moderate PR. LHC 11/2022 showed severe single-vessel disease with CTO of Lcx/LPLA with left-to-left collaterals. During that time PVR was 5, PCWP was 18, and CI was 2.1.   Pertinent Lab Values: Creatinine  Date Value Ref Range Status  09/21/2014 1.10 mg/dL Final    Comment:    4.54-0.98 NOTE: New Reference Range  08/07/14    Creatinine, Ser  Date Value Ref Range Status  07/15/2023 1.03 0.61 - 1.24 mg/dL Final   BUN  Date Value Ref Range Status  07/15/2023 15 8 - 23 mg/dL Final  11/91/4782 9 8 - 27 mg/dL Final  95/62/1308 15 mg/dL Final    Comment:    6-57 NOTE: New Reference Range  08/07/14    Potassium  Date Value Ref Range Status  07/15/2023 3.6  3.5 - 5.1 mmol/L Final  09/21/2014 3.5 mmol/L Final    Comment:    3.5-5.1 NOTE: New Reference Range  08/07/14    Sodium  Date Value Ref Range Status  07/15/2023 132 (L) 135 - 145 mmol/L Final  08/26/2021 140 134 - 144 mmol/L Final  09/21/2014 139 mmol/L Final    Comment:    135-145 NOTE: New Reference Range  08/07/14    B Natriuretic Peptide  Date Value Ref Range Status  07/10/2023 899.4 (H) 0.0 - 100.0 pg/mL Final    Comment:    Performed at [facility], [address] [place], [place] [address]   Magnesium  Date Value Ref Range Status  07/15/2023 2.0 1.7 - 2.4 mg/dL Final    Comment:    Performed at [facility], [address] [place], [place] [address]  09/21/2014 1.8 mg/dL Final    Comment:    4.1-3.2 THERAPEUTIC RANGE: 4-7 mg/dL TOXIC: > 10 mg/dL  ----------------------- NOTE: New Reference Range  08/07/14    Hemoglobin A1C  Date Value Ref Range Status  03/28/2014 6.4 (H) 4.2 - 6.3 % Final    Comment:    The American Diabetes Association recommends that a primary goal of therapy should be <7% and that physicians should reevaluate the treatment regimen in patients with HbA1c values consistently >8%.    HB A1C (BAYER DCA - WAIVED)  Date Value Ref Range Status  07/30/2016 6.1 <7.0 % Final    Comment:  Diabetic Adult            <7.0                                       Healthy Adult        4.3 - 5.7                                                           (DCCT/NGSP) American Diabetes Association's Summary of Glycemic Recommendations for Adults with Diabetes: Hemoglobin A1c <7.0%. More stringent glycemic goals (A1c <6.0%) may further reduce complications at the cost of increased risk of hypoglycemia.    Hgb A1c MFr Bld  Date Value Ref Range Status  03/22/2023 5.5 4.8 - 5.6 % Final    Comment:    (NOTE) Pre diabetes:          5.7%-6.4%  Diabetes:              >6.4%  Glycemic  control for   <7.0% adults with diabetes    Digoxin Level  Date Value Ref Range Status  07/15/2023 0.6 (L) 0.8 - 2.0 ng/mL Final    Comment:    Performed at [facility], [address] [place], [place] [address]   TSH  Date Value Ref Range Status  07/10/2023 3.879 0.350 - 4.500 uIU/mL Final    Comment:    Performed by a 3rd Generation assay with a functional sensitivity of <=0.01 uIU/mL. Performed at [facility], [address] [place], [place] [address]   08/26/2021 1.600 0.450 - 4.500 uIU/mL Final    Vital Signs: Admission weight: 184.7 lbs Temp:  [98 F (36.7 C)-98.6 F (37 C)] 98.6 F (37 C) (02/13 0814) Pulse Rate:  [77-85] 85 (02/13 0814) Cardiac Rhythm: Normal sinus rhythm (02/13 0811) Resp:  [16] 16 (02/13 0313) BP: (97-103)/(62-70) 103/69 (02/13 0814) SpO2:  [97 %-99 %] 99 % (02/13 0814) Weight:  [68.6 kg (151 lb 3.8 oz)] 68.6 kg (151 lb 3.8 oz) (02/13 0530)  Intake/Output Summary (Last 24 hours) at 07/15/2023 1014 Last data filed at 07/15/2023 0930 Gross per 24 hour  Intake 511.81 ml  Output 3350 ml  Net -2838.19 ml    Current Heart Failure Medications:  Loop diuretic: Furosemide 8 mg/hr with plan to transition to 60 IV mg TID this evening Beta-Blocker: Metoprolol succinate 12.5 mg daily  ACEI/ARB/ARNI: none MRA: spironolactone 12.5 mg daily SGLT2i: none Other: Digoxin 0.125 mg daily   Prior to admission Heart Failure Medications:  Patient reported filling medications at [facility], however, after speaking with them on the phone, there have been no prescriptions filled in the last year. After discussing this with the patient he admits to being off medications for a very long time.  Assessment: 1. Acute on chronic systolic heart failure (LVEF 35-40%), due to predominant NICM (high AF burden). NYHA class III symptoms.  -Symptoms: Significant improvement in shortness of breath and orthopnea. LEE still present, though  down significantly. Fatigue/drowsiness is present, but reports this is more so now due to his alaris pump alarms inhibiting sleep. Reports appetite is back to normal. Lethargy continuing to improve. -Volume: Still hypervolemic on exam. Excellent urine output on 8 mg/hr.  Creatinine and BUN are stable. Weight is down 16 lbs from yesterday, though doubt accuracy. Urine color is still light. Transitioning from furosemide infusion to 60 mg IV TID this evening to allow better sleep. May benefit from TED hose for LEE. -Hemodynamics: BP is low-stable and HR is stable from 70-80s. -BB: Patient prescribed high dose beta blocker PTA, however had stopped it for several months. Metoprolol was restarted at home dose and contributed to lethargy. Now on metoprolol 12.5 mg daily and doing much better. Can consider increasing if heart rate increases with digoxin reduction. -ACEI/ARB/ARNI: Sherryll Burger was stopped for soft BP. Unable to re-start at this time given persistently low BP. -MRA: On low dose spironolactone given BP. K is low. Can consider increasing prior to discharge pending BP.  -SGLT2i: Consider starting Farxiga 10 mg daily when external catheter is removed given minimal effect on BP. Empulse trial and to a lesser extent Dictate AHF trial show benefits of inpatient SGLT2i initiation. -Digoxin level was 0.6 5 days form start. Discussed with cardiology and will decrease digoxin to 0.0625 mg daily given higher tissue accumulation in geriatric patients with prolonged use and lower correlation of levels with therapeutic effect.   Plan: 1) Medication changes recommended at this time: -Agree with changes  2) Patient assistance: -Copay for Clifton Custard, Jardiance, Eliquis, and Xarelto are $0 -Patient would like to receive medications via [facility] mail order pharmacy in the future for convenience.  3) Education: - Patient has been educated on current HF medications and potential additions to HF medication  regimen - Patient verbalizes understanding that over the next few months, these medication doses may change and more medications may be added to optimize HF regimen - Patient has been educated on basic disease state pathophysiology and goals of therapy  Medication Assistance / Insurance Benefits Check: Does the patient have prescription insurance?    Type of insurance plan:  Does the patient qualify for medication assistance through manufacturers or grants? Pending  Eligible grants and/or patient assistance programs: Pending  Medication assistance applications in progress: Pending  Medication assistance applications approved: Pending Approved medication assistance renewals will be completed by: Pending  Outpatient Pharmacy: Prior to admission outpatient pharmacy: Not filling medications PTA Is the patient agreeable to switch to [facility]?: Yes Is the patient willing to utilize a [facility] pharmacy at discharge?: Yes  Please do not hesitate to reach out with questions or concerns,  Enos Fling, PharmD, CPP, BCPS Heart Failure Pharmacist  Phone - [phone] 07/15/2023 10:14 AM

## 2023-07-15 NOTE — Progress Notes (Signed)
CSW met with patient at bedside to discuss transportation options for follow up appointments. Patient states he has used medicaid transport in the past although finds it very frustrating as the number rings and rings and unable to make contact. CSW discussed option to explore with [facility] Medicare as an alternative option. Patient aware of member services on the back of his insurance card and plans to follow up. Patient has follow up appointment in the HF clinic on 07-19-23 and will need transport. CSW discussed taxi ride and will get set up for patient. CSW will follow up on Monday to confirm pick up with patient. Lasandra Beech, LCSW, CCSW-MCS [phone]

## 2023-07-15 NOTE — Plan of Care (Signed)
  Problem: Education: Goal: Knowledge of General Education information will improve Description: Including pain rating scale, medication(s)/side effects and non-pharmacologic comfort measures Outcome: Progressing   Problem: Clinical Measurements: Goal: Diagnostic test results will improve Outcome: Progressing   Problem: Elimination: Goal: Will not experience complications related to bowel motility Outcome: Progressing   Problem: Safety: Goal: Ability to remain free from injury will improve Outcome: Progressing   Problem: Skin Integrity: Goal: Risk for impaired skin integrity will decrease Outcome: Progressing

## 2023-07-15 NOTE — Progress Notes (Signed)
Rounding Note    Patient Name: Darren Rogers Date of Encounter: 07/15/2023  [facility] Cardiologist: Lorine Bears, MD   Subjective   Patient reports that breathing is improving. He does express frustration for Lasix drip disrupting his sleep overnight. Remains volume overloaded. Cr stable at 1.03. Total net output since admission -13 L.   Inpatient Medications    Scheduled Meds:  apixaban  5 mg Oral BID   atorvastatin  40 mg Oral Daily   digoxin  0.125 mg Oral Daily   folic acid  1 mg Oral Daily   metoprolol succinate  12.5 mg Oral Daily   multivitamin with minerals  1 tablet Oral Daily   nicotine  21 mg Transdermal Daily   pantoprazole  40 mg Oral Daily   potassium chloride  40 mEq Oral BID   QUEtiapine  25 mg Oral QHS   sodium chloride flush  3 mL Intravenous Q12H   spironolactone  12.5 mg Oral Daily   thiamine  100 mg Oral Daily   Or   thiamine  100 mg Intravenous Daily   Continuous Infusions:  furosemide (LASIX) 200 mg in dextrose 5 % 100 mL (2 mg/mL) infusion 8 mg/hr (07/14/23 2059)   PRN Meds: acetaminophen, ondansetron (ZOFRAN) IV, polyethylene glycol, sodium chloride flush   Vital Signs    Vitals:   07/15/23 0005 07/15/23 0313 07/15/23 0530 07/15/23 0814  BP: 99/70 97/67  103/69  Pulse: 83 81  85  Resp: 16 16    Temp: 98.6 F (37 C) 98.4 F (36.9 C)  98.6 F (37 C)  TempSrc: Oral Oral  Oral  SpO2: 97% 98%  99%  Weight:   68.6 kg   Height:        Intake/Output Summary (Last 24 hours) at 07/15/2023 0830 Last data filed at 07/15/2023 0600 Gross per 24 hour  Intake 274.81 ml  Output 3600 ml  Net -3325.19 ml      07/15/2023    5:30 AM 07/14/2023    3:31 AM 07/13/2023    3:55 AM  Last 3 Weights  Weight (lbs) 151 lb 3.8 oz 167 lb 1.7 oz 171 lb 1.2 oz  Weight (kg) 68.6 kg 75.8 kg 77.6 kg      Telemetry    Sinus with PACs and PVCs, rate 80s bpm - Personally Reviewed  Physical Exam   GEN: No acute distress.   Neck: + JVD Cardiac:  RRR, no murmurs, rubs, or gallops.  Respiratory: Clear to auscultation bilaterally. GI: Soft, nontender, distension improved MS: 1-2+ pitting edema of the lower extremities bilaterally; No deformity. Neuro:  Nonfocal  Psych: Normal affect   Labs    High Sensitivity Troponin:   Recent Labs  Lab 07/10/23 1548 07/10/23 1552  TROPONINIHS 12 12     Chemistry Recent Labs  Lab 07/10/23 1552 07/11/23 0430 07/12/23 0340 07/13/23 0231 07/14/23 0300 07/15/23 0620  NA 125* 127* 132* 130* 132* 132*  K 4.2 3.8 3.4* 4.0 3.6 3.6  CL 92* 95* 95* 97* 94* 93*  CO2 17* 20* 26 23 29 30   GLUCOSE 95 111* 88 107* 67* 104*  BUN 13 15 15 21 17 15   CREATININE 0.86 0.86 1.02 1.00 0.98 1.03  CALCIUM 8.7* 8.6* 8.4* 8.6* 8.5* 8.8*  MG 2.0  --  1.9 1.9 1.8 2.0  PROT 6.5 5.8* 5.8*  --   --   --   ALBUMIN 3.3* 3.0* 3.0*  --   --  3.0*  AST  65* 50* 45*  --   --   --   ALT 35 32 28  --   --   --   ALKPHOS 165* 140* 132*  --   --   --   BILITOT 4.9* 4.3* 3.3*  --   --   --   GFRNONAA >60 >60 >60 >60 >60 >60  ANIONGAP 16* 12 11 10 9 9     Lipids No results for input(s): "CHOL", "TRIG", "HDL", "LABVLDL", "LDLCALC", "CHOLHDL" in the last 168 hours.  Hematology Recent Labs  Lab 07/10/23 1548  WBC 4.5  RBC 3.87*  HGB 12.7*  HCT 35.8*  MCV 92.5  MCH 32.8  MCHC 35.5  RDW 17.2*  PLT 175   Thyroid  Recent Labs  Lab 07/10/23 1552  TSH 3.879    BNP Recent Labs  Lab 07/10/23 1548  BNP 899.4*    DDimer No results for input(s): "DDIMER" in the last 168 hours.   Radiology    ECHOCARDIOGRAM COMPLETE Result Date: 07/14/2023 IMPRESSIONS  1. Left ventricular ejection fraction, by estimation, is 30 to 35%. The left ventricle has moderately decreased function. The left ventricle demonstrates global hypokinesis. Left ventricular diastolic parameters are indeterminate.  2. Right ventricular systolic function is moderately reduced. The right ventricular size is mildly enlarged. There is mildly elevated  pulmonary artery systolic pressure.  3. Right atrial size was moderately dilated.  4. The mitral valve is normal in structure. Moderate mitral valve regurgitation. No evidence of mitral stenosis.  5. The tricuspid valve is degenerative. Tricuspid valve regurgitation is severe.  6. The aortic valve is tricuspid. Aortic valve regurgitation is mild. No aortic stenosis is present.  7. The inferior vena cava is dilated in size with <50% respiratory variability, suggesting right atrial pressure of 15 mmHg. Electronically signed by Yvonne Kendall MD Signature Date/Time: 07/14/2023/7:45:50 AM    Final    Cardiac Studies   12/23/2022 L/RHC Severe single-vessel coronary artery disease with chronic total occlusion of distal LCx/LPLA.  LPDA is supplied by left-to-left collaterals.  There is mild-moderate, non-obstructive disease involving the LAD. Upper normal to mildly elevated left and right heart filling pressures (LVEDP 17 mmHg, PCWP 18 mmHg, RA 7 mmHg). Moderate pulmonary hypertension (mean PA 37 mmHg, PVR 5 WU) Mildly reduced Fick cardiac output/index (CO 3.8 L/min, CI 2.1 L/min/m^2).   12/21/2022 TTE 1. Left ventricular ejection fraction, by estimation, is 35 to 40%. The  left ventricle has moderately decreased function. The left ventricle  demonstrates global hypokinesis. There is mild left ventricular  hypertrophy. Left ventricular diastolic  parameters are indeterminate.   2. Right ventricular systolic function is normal. The right ventricular  size is normal. There is moderately elevated pulmonary artery systolic  pressure. The estimated right ventricular systolic pressure is 53.4 mmHg.   3. Left atrial size was mildly dilated.   4. Right atrial size was mildly dilated.   5. The mitral valve is normal in structure. Mild to moderate mitral valve  regurgitation. No evidence of mitral stenosis.   6. Tricuspid valve regurgitation is moderate.   7. The aortic valve is normal in structure. Aortic  valve regurgitation is  mild. No aortic stenosis is present.   8. Pulmonic valve regurgitation is moderate.   9. Aortic dilatation noted. There is mild dilatation of the aortic root,  measuring 42 mm.  10. Mildly dilated pulmonary artery.  11. The inferior vena cava is normal in size with <50% respiratory  variability, suggesting right atrial pressure of  8 mmHg.   Patient Profile     68 y.o. male with a hx of medication noncompliance, CAD, chronic atrial fibrillation on Eliquis and digoxin, HTN, HLD, chronic HFrEF with LVEF 35-40%, frequent PVCs, alcohol abuse, and recurrent alcoholic pancreatitis who is being seen for the further evaluation of CHF exacerbation and NSVT  Assessment & Plan    Acute on chronic systolic heart failure Alcohol induced cardiomyopathy Anasarca Noncompliant with medical therapy - History of CHF since 11/2022. Echo at that time showed LVEF 35-40%. History of medical noncompliance and alcoholism.  - Presented 2/8 with DOE and massive volume overload, was drinking excessive amounts of alcohol daily prior to admission and taking medication inconsistently  - Echo 2/11 showed LVEF 30-35% with LV global hypokinesia, mildly elevated PA systolic pressure, moderately dilated RA, moderate MR, severe TR - Net output since admission -13 L - Cr stable at 1.03 - Patient remains volume overloaded on exam - Continue Lasix drip at 8 mg/hr with plan to transition to IV Lasix 60 mg TID this evening - Continue spirolactone 12.5 mg daily and metoprolol succinate 12.5 mg daily  - Dig level 0.6, discussed with pharmacy and recommended decreasing digoxin to 0.0625 mg daily - Hypotension preventing further escalation of GDMT  Paroxysmal atrial fibrillation with RVR - Longstanding history of atrial fibrillation, noncompliant with medications - EMS found patient to be in atrial fibrillation with RVR, rates up to 200s bpm, patient apparently lethargic and in respiratory distress. EMS  unable to gain IV access. Patient ultimately given versed and shocked at 200 J.  - Upon arrival to the ED patient was in sinus tachycardia - Recommend K >4 and Mag > 2, TSH wnl - Remains in sinus rhythms with PACs and PVCs - Continue Eliquis 5 mg BID, metoprolol and digoxin as above  NSVT - Patient with known CAD without targets for revascularization on most recent cath - No further episodes of NSVT today - Continue metoprolol as above  CAD - LHC 11/2022 showed severe single vessel CAD with good collaterals. 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

## 2023-07-16 DIAGNOSIS — I4819 Other persistent atrial fibrillation: Secondary | ICD-10-CM

## 2023-07-16 DIAGNOSIS — I5023 Acute on chronic systolic (congestive) heart failure: Secondary | ICD-10-CM | POA: Diagnosis not present

## 2023-07-16 LAB — BASIC METABOLIC PANEL
Anion gap: 12 (ref 5–15)
BUN: 14 mg/dL (ref 8–23)
CO2: 27 mmol/L (ref 22–32)
Calcium: 9.2 mg/dL (ref 8.9–10.3)
Chloride: 94 mmol/L — ABNORMAL LOW (ref 98–111)
Creatinine, Ser: 0.98 mg/dL (ref 0.61–1.24)
GFR, Estimated: >60 mL/min (ref >60)
Glucose, Bld: 136 mg/dL — ABNORMAL HIGH (ref 70–99)
Potassium: 3.7 mmol/L (ref 3.5–5.1)
Sodium: 133 mmol/L — ABNORMAL LOW (ref 135–145)

## 2023-07-16 LAB — PHOSPHORUS: Phosphorus: 2.5 mg/dL (ref 2.5–4.6)

## 2023-07-16 LAB — MAGNESIUM: Magnesium: 2 mg/dL (ref 1.7–2.4)

## 2023-07-16 LAB — CBC
HCT: 34.9 % — ABNORMAL LOW (ref 39.0–52.0)
Hemoglobin: 12.7 g/dL — ABNORMAL LOW (ref 13.0–17.0)
MCH: 32.6 pg (ref 26.0–34.0)
MCHC: 36.4 g/dL — ABNORMAL HIGH (ref 30.0–36.0)
MCV: 89.5 fL (ref 80.0–100.0)
Platelets: 229 10*3/uL (ref 150–400)
RBC: 3.9 MIL/uL — ABNORMAL LOW (ref 4.22–5.81)
RDW: 16.7 % — ABNORMAL HIGH (ref 11.5–15.5)
WBC: 4.1 10*3/uL (ref 4.0–10.5)
nRBC: 0 % (ref 0.0–0.2)

## 2023-07-16 MED ORDER — FUROSEMIDE 10 MG/ML IJ SOLN: 60.0000 mg | Freq: Two times a day (BID) | INTRAMUSCULAR | Status: DC

## 2023-07-16 MED ORDER — FUROSEMIDE 10 MG/ML IJ SOLN
80.0000 mg | Freq: Two times a day (BID) | INTRAMUSCULAR | Status: DC
  Administered 2023-07-16 – 2023-07-17 (×3): 80 mg via INTRAVENOUS
  Filled 2023-07-16 (×4): qty 8

## 2023-07-16 MED ORDER — LOSARTAN POTASSIUM 25 MG PO TABS
12.5000 mg | ORAL_TABLET | Freq: Every day | ORAL | Status: DC
  Administered 2023-07-16 – 2023-07-18 (×3): 12.5 mg via ORAL
  Filled 2023-07-16 (×3): qty 1

## 2023-07-16 MED ORDER — POTASSIUM CHLORIDE CRYS ER 20 MEQ PO TBCR
40.0000 meq | EXTENDED_RELEASE_TABLET | Freq: Once | ORAL | Status: AC
  Administered 2023-07-16: 40 meq via ORAL
  Filled 2023-07-16: qty 2

## 2023-07-16 NOTE — Progress Notes (Signed)
PROGRESS NOTE    Darren Rogers  VWU:[id number] DOB: 01-16-1956 DOA: 07/10/2023 PCP: Pcp, No  [id number]  LOS: 5 days   Brief hospital course:   Assessment & Plan: Darren Rogers is a 68 y.o. male with medical history significant of medication noncompliance, CAD, chronic A-fib on Eliquis and digoxin, HTN, HLD, chronic HFrEF with LVEF 35-40%, frequent PVCs, alcohol abuse and recurrent alcoholic pancreatitis presented with increasing shortness of breath, peripheral edema feeling malaise.  Patient drinks 48 ounces of beer twice a day, and not reported taking his medication occasionally.  on EMS arrival, pt found to have HR 190s-200s. Patient also lethargic for EMS.  Due to inability to obtain access, pt given IM versed and shocked by EMS at 200J.  Upon arriving to hospital, patient was found to have anasarca, chest x-ray showed cardiomegaly with pulmonary edema, significant elevated BNP.  Patient was started on diuretics for CHF exacerbation.   Acute respiratory failure with hypoxemia 2/2 Pulm edema Upon arriving the emergency room, patient had severe respiratory distress, oxygen saturation was as low as 78%, he was placed on a breather.  chest x-ray showed cardiomegaly with pulmonary edema.   --Pt has been weaned off supplemental oxygen after receiving diuresis.   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 Noncompliant with medical treatment. --presented with massive fluid overload.  current LVEF 30-35%.   --started on Lasix gtt, transitioned to bolus IV lasix on 2/13 Plan: --cont IV lasix as 80 mg BID --cont Aldactone (new) --cont Toprol --add losartan 12.5 mg daily   Hypokalemia. --monitor and supplement PRN   Hyponatremia with hypervolemia. 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 Lactic acidosis secondary to liver failure. Alcohol dependence. --cont  lasix and aldactone   Single-vessel coronary disease. --cont statin --on Eliquis in place of ASA   Paroxysmal atrial fibrillation with RVR. --cont digoxin and Toprol --cont Eliquis   Nonsustained ventricular tachycardia. Patient had 13 beats of V. tach 2/10.  This is in the setting of acute congestive heart failure and electrolytes abnormality.   --cont Toprol   DVT prophylaxis: GN:FAOZHYQ Code Status: Full code  Family Communication:  Level of care: Telemetry Cardiac Dispo:   The patient is from: home Anticipated d/c is to: home Anticipated d/c date is: 1-2 days   Subjective and Interval History:  Pt had an episode of worsening dyspnea today.  Pt reported continued good urine output, and that his legs had never been this small.   Objective: Vitals:   07/16/23 0420 07/16/23 0500 07/16/23 0742 07/16/23 1208  BP: 105/70  118/72 (!) 107/59  Pulse: 83  83 81  Resp: 19  17 18   Temp: 98.4 F (36.9 C)  97.9 F (36.6 C) 98.4 F (36.9 C)  TempSrc: Oral     SpO2: 96%  99% 97%  Weight:  65.8 kg    Height:        Intake/Output Summary (Last 24 hours) at 07/16/2023 1936 Last data filed at 07/16/2023 1500 Gross per 24 hour  Intake 0 ml  Output 2250 ml  Net -2250 ml   Filed Weights   07/14/23 0331 07/15/23 0530 07/16/23 0500  Weight: 75.8 kg 68.6 kg 65.8 kg    Examination:   Constitutional: NAD, AAOx3 HEENT: conjunctivae and lids normal, EOMI CV: No cyanosis.   RESP: normal respiratory effort, on RA Extremities: improved edema in BLE SKIN: warm, dry Neuro: II - XII grossly intact.  Psych: Normal mood and affect.  Appropriate judgement and reason   Data Reviewed: I have personally reviewed labs and imaging studies  Time spent: 35 minutes  Darlin Priestly, MD [facility] If 7PM-7AM, please contact night-coverage 07/16/2023, 7:36 PM

## 2023-07-16 NOTE — Progress Notes (Signed)
Heart Failure Stewardship Pharmacy Note  PCP: Pcp, No PCP-Cardiologist: Lorine Bears, MD  HPI: Darren Rogers is a 68 y.o. male with former polysubstance use (cocaine, alcohol, tobacco), recurrent alcoholic pancreatitis, A fib on Eliquis and digoxin, HTN, HLD, depression, CAD, PAD with claudication, BPH, PVC who presented with heart racing, fall, and mild shortness of breath. Patient had been nonadherent to HF and AF medications including Eliquis prior to admission. On admission, BNP was 899.4, HS-troponin was 12, lactic acid was 2.6, digoxin level was <0.2, TSH was 3.879, ethanol was negative, and UDS was negative. Chest x-ray noted perihilar vascular congestion, mild lower zonal interstitial edema and small pleural effusions.    Pertinent cardiac history: TTE in 05/2015 showed LEF 60-65%. Moderate blockage noted on arterial doppler in 06/2015. Stress test in 06/2015 showed T wave inversion in I, II, III, V4, V5, V6 during stress. A 2% PVC burden was noted 04/2017 via long term event monitor. TTE 07/2020 showed LVEF 50-55%. TTE 20/2022 showed LVEF 55-60% and low normal RV function. Event monitor 04/2021 showed 100% AF burden with rate of 99. TTE 11/2022 showed LVEF down to 35-40%, normal RV, mild-moderate MR, moderate TR, mild AR, and moderate PR. LHC 11/2022 showed severe single-vessel disease with CTO of Lcx/LPLA with left-to-left collaterals. During that time PVR was 5, PCWP was 18, and CI was 2.1.   Pertinent Lab Values: Creatinine  Date Value Ref Range Status  09/21/2014 1.10 mg/dL Final    Comment:    1.61-0.96 NOTE: New Reference Range  08/07/14    Creatinine, Ser  Date Value Ref Range Status  07/16/2023 0.98 0.61 - 1.24 mg/dL Final   BUN  Date Value Ref Range Status  07/16/2023 14 8 - 23 mg/dL Final  04/54/0981 9 8 - 27 mg/dL Final  19/14/7829 15 mg/dL Final    Comment:    5-62 NOTE: New Reference Range  08/07/14    Potassium  Date Value Ref Range Status  07/16/2023 3.7  3.5 - 5.1 mmol/L Final  09/21/2014 3.5 mmol/L Final    Comment:    3.5-5.1 NOTE: New Reference Range  08/07/14    Sodium  Date Value Ref Range Status  07/16/2023 133 (L) 135 - 145 mmol/L Final  08/26/2021 140 134 - 144 mmol/L Final  09/21/2014 139 mmol/L Final    Comment:    135-145 NOTE: New Reference Range  08/07/14    B Natriuretic Peptide  Date Value Ref Range Status  07/10/2023 899.4 (H) 0.0 - 100.0 pg/mL Final    Comment:    Performed at [facility], [address] [place], [place] [address]   Magnesium  Date Value Ref Range Status  07/16/2023 2.0 1.7 - 2.4 mg/dL Final    Comment:    Performed at [facility], [address] [place], [place] [address]  09/21/2014 1.8 mg/dL Final    Comment:    9.6-2.9 THERAPEUTIC RANGE: 4-7 mg/dL TOXIC: > 10 mg/dL  ----------------------- NOTE: New Reference Range  08/07/14    Hemoglobin A1C  Date Value Ref Range Status  03/28/2014 6.4 (H) 4.2 - 6.3 % Final    Comment:    The American Diabetes Association recommends that a primary goal of therapy should be <7% and that physicians should reevaluate the treatment regimen in patients with HbA1c values consistently >8%.    HB A1C (BAYER DCA - WAIVED)  Date Value Ref Range Status  07/30/2016 6.1 <7.0 % Final    Comment:  Diabetic Adult            <7.0                                       Healthy Adult        4.3 - 5.7                                                           (DCCT/NGSP) American Diabetes Association's Summary of Glycemic Recommendations for Adults with Diabetes: Hemoglobin A1c <7.0%. More stringent glycemic goals (A1c <6.0%) may further reduce complications at the cost of increased risk of hypoglycemia.    Hgb A1c MFr Bld  Date Value Ref Range Status  03/22/2023 5.5 4.8 - 5.6 % Final    Comment:    (NOTE) Pre diabetes:          5.7%-6.4%  Diabetes:              >6.4%  Glycemic  control for   <7.0% adults with diabetes    Digoxin Level  Date Value Ref Range Status  07/15/2023 0.6 (L) 0.8 - 2.0 ng/mL Final    Comment:    Performed at [facility], [address] [place], [place] [address]   TSH  Date Value Ref Range Status  07/10/2023 3.879 0.350 - 4.500 uIU/mL Final    Comment:    Performed by a 3rd Generation assay with a functional sensitivity of <=0.01 uIU/mL. Performed at [facility], [address] [place], [place] [address]   08/26/2021 1.600 0.450 - 4.500 uIU/mL Final    Vital Signs: Admission weight: 184.7 lbs Temp:  [97.9 F (36.6 C)-98.9 F (37.2 C)] 97.9 F (36.6 C) (02/14 0742) Pulse Rate:  [77-83] 83 (02/14 0742) Cardiac Rhythm: Normal sinus rhythm (02/14 0700) Resp:  [17-20] 17 (02/14 0742) BP: (92-118)/(61-75) 118/72 (02/14 0742) SpO2:  [94 %-100 %] 99 % (02/14 0742) Weight:  [65.8 kg (145 lb 1 oz)] 65.8 kg (145 lb 1 oz) (02/14 0500)  Intake/Output Summary (Last 24 hours) at 07/16/2023 0849 Last data filed at 07/16/2023 0518 Gross per 24 hour  Intake 817 ml  Output 4300 ml  Net -3483 ml    Current Heart Failure Medications:  Loop diuretic: Furosemide 8 mg/hr with plan to transition to 60 IV mg TID this evening Beta-Blocker: Metoprolol succinate 12.5 mg daily  ACEI/ARB/ARNI: none MRA: spironolactone 12.5 mg daily SGLT2i: none Other: Digoxin 0.0625 mg daily   Prior to admission Heart Failure Medications:  Patient reported filling medications at [facility], however, after speaking with them on the phone, there have been no prescriptions filled in the last year. After discussing this with the patient he admits to being off medications for a very long time.  Assessment: 1. Acute on chronic systolic heart failure (LVEF 35-40%), due to predominant NICM (high AF burden). NYHA class III symptoms.  -Symptoms: Significant improvement in shortness of breath and orthopnea. LEE appears resolved.  Fatigue/drowsiness is also improved. Reports appetite is strong. Now just feels deconditioned. -Volume: Nearly euvolemic on exam. Excellent urine output on 60 mg TID. Creatinine and BUN are stable. Weight is down ~40 lbs form admission. LEE appears resolved. Patient reported being  afraid too much fluid has been taken off of him, but does not have any symptoms of hypovolemia and urine color is still clear. Can consider transition to oral diuretics tomorrow if everything remains stable. -Hemodynamics: BP is increasing with nearing euvolemia and HR is stable from 70-80s. -BB: Patient prescribed high dose beta blocker PTA, however had stopped it for several months. Metoprolol was restarted at home dose and contributed to lethargy. Now on metoprolol 12.5 mg daily and doing much better. Can consider increasing metoprolol. -ACEI/ARB/ARNI: Sherryll Burger was stopped for soft BP. May be able to transition to losartan tomorrow if BP remains stable. -MRA: On low dose spironolactone given BP. K is low. Can consider increasing prior to discharge pending BP.  -SGLT2i: Consider starting Farxiga 10 mg daily when external catheter is removed given minimal effect on BP.   Plan: 1) Medication changes recommended at this time: -Can consider transition to oral furosemide 40 mg daily tomorrow -Can consider adding Farxiga 10 mg daily when external urinary catheter is removed -Recommend filling medications today if discharge is anticipated over the weekend.  2) Patient assistance: -Copay for Clifton Custard, Jardiance, Eliquis, and Xarelto are $0 -Patient would like to receive medications via [facility] mail order pharmacy in the future for convenience.  3) Education: - Patient has been educated on current HF medications and potential additions to HF medication regimen - Patient verbalizes understanding that over the next few months, these medication doses may change and more medications may be added to optimize HF  regimen - Patient has been educated on basic disease state pathophysiology and goals of therapy  Medication Assistance / Insurance Benefits Check: Does the patient have prescription insurance?    Type of insurance plan:  Does the patient qualify for medication assistance through manufacturers or grants? Pending  Eligible grants and/or patient assistance programs: Pending  Medication assistance applications in progress: Pending  Medication assistance applications approved: Pending Approved medication assistance renewals will be completed by: Pending  Outpatient Pharmacy: Prior to admission outpatient pharmacy: Not filling medications PTA Is the patient agreeable to switch to [facility] Outpatient Pharmacy?: Yes Is the patient willing to utilize a [facility] pharmacy at discharge?: Yes  Please do not hesitate to reach out with questions or concerns,  Enos Fling, PharmD, CPP, BCPS Heart Failure Pharmacist  Phone - [phone] 07/16/2023 8:49 AM

## 2023-07-16 NOTE — Plan of Care (Signed)
Problem: Clinical Measurements: Goal: Ability to maintain clinical measurements within normal limits will improve Outcome: Progressing   Problem: Nutrition: Goal: Adequate nutrition will be maintained Outcome: Progressing   Problem: Elimination: Goal: Will not experience complications related to bowel motility Outcome: Progressing

## 2023-07-16 NOTE — Progress Notes (Signed)
Rounding Note    Patient Name: Darren Rogers Date of Encounter: 07/16/2023  [facility] Cardiologist: Lorine Bears, MD   Subjective   Patient seen on a.m. rounds.  Denies any chest pain.  States her breathing has improved.  States he did not get more sleep last night been off of the furosemide drip.  Serum creatinine remained stable.  -4.1 L output in the last 24 hours.  -16.8 L output since admission  Inpatient Medications    Scheduled Meds:  apixaban  5 mg Oral BID   atorvastatin  40 mg Oral Daily   digoxin  0.0625 mg Oral Daily   folic acid  1 mg Oral Daily   furosemide  60 mg Intravenous BID   losartan  12.5 mg Oral Daily   metoprolol succinate  12.5 mg Oral Daily   multivitamin with minerals  1 tablet Oral Daily   nicotine  21 mg Transdermal Daily   pantoprazole  40 mg Oral Daily   QUEtiapine  25 mg Oral QHS   sodium chloride flush  3 mL Intravenous Q12H   spironolactone  12.5 mg Oral Daily   thiamine  100 mg Oral Daily   Or   thiamine  100 mg Intravenous Daily   Continuous Infusions:  PRN Meds: acetaminophen, ondansetron (ZOFRAN) IV, polyethylene glycol, sodium chloride flush   Vital Signs    Vitals:   07/16/23 0420 07/16/23 0500 07/16/23 0742 07/16/23 1208  BP: 105/70  118/72 (!) 107/59  Pulse: 83  83 81  Resp: 19  17 18   Temp: 98.4 F (36.9 C)  97.9 F (36.6 C) 98.4 F (36.9 C)  TempSrc: Oral     SpO2: 96%  99% 97%  Weight:  65.8 kg    Height:        Intake/Output Summary (Last 24 hours) at 07/16/2023 1315 Last data filed at 07/16/2023 1610 Gross per 24 hour  Intake 220 ml  Output 4400 ml  Net -4180 ml      07/16/2023    5:00 AM 07/15/2023    5:30 AM 07/14/2023    3:31 AM  Last 3 Weights  Weight (lbs) 145 lb 1 oz 151 lb 3.8 oz 167 lb 1.7 oz  Weight (kg) 65.8 kg 68.6 kg 75.8 kg      Telemetry    Sinus with PACs and PVCs with rates in the 80s- Personally Reviewed  ECG    No new tracings been completed- Personally  Reviewed  Physical Exam   GEN: No acute distress.   Neck: + JVD Cardiac: RRR, no murmurs, rubs, or gallops.  Respiratory: Clear to auscultation bilaterally.  Respirations are unlabored at rest on room air GI: Soft, nontender, non-distended  MS: Trace edema; No deformity. Neuro:  Nonfocal  Psych: Normal affect   Labs    High Sensitivity Troponin:   Recent Labs  Lab 07/10/23 1548 07/10/23 1552  TROPONINIHS 12 12     Chemistry Recent Labs  Lab 07/10/23 1552 07/11/23 0430 07/12/23 0340 07/13/23 0231 07/14/23 0300 07/15/23 0620 07/16/23 0645  NA 125* 127* 132*   < > 132* 132* 133*  K 4.2 3.8 3.4*   < > 3.6 3.6 3.7  CL 92* 95* 95*   < > 94* 93* 94*  CO2 17* 20* 26   < > 29 30 27   GLUCOSE 95 111* 88   < > 67* 104* 136*  BUN 13 15 15    < > 17 15 14   CREATININE 0.86  0.86 1.02   < > 0.98 1.03 0.98  CALCIUM 8.7* 8.6* 8.4*   < > 8.5* 8.8* 9.2  MG 2.0  --  1.9   < > 1.8 2.0 2.0  PROT 6.5 5.8* 5.8*  --   --   --   --   ALBUMIN 3.3* 3.0* 3.0*  --   --  3.0*  --   AST 65* 50* 45*  --   --   --   --   ALT 35 32 28  --   --   --   --   ALKPHOS 165* 140* 132*  --   --   --   --   BILITOT 4.9* 4.3* 3.3*  --   --   --   --   GFRNONAA >60 >60 >60   < > >60 >60 >60  ANIONGAP 16* 12 11   < > 9 9 12    < > = values in this interval not displayed.    Lipids No results for input(s): "CHOL", "TRIG", "HDL", "LABVLDL", "LDLCALC", "CHOLHDL" in the last 168 hours.  Hematology Recent Labs  Lab 07/10/23 1548 07/16/23 0645  WBC 4.5 4.1  RBC 3.87* 3.90*  HGB 12.7* 12.7*  HCT 35.8* 34.9*  MCV 92.5 89.5  MCH 32.8 32.6  MCHC 35.5 36.4*  RDW 17.2* 16.7*  PLT 175 229   Thyroid  Recent Labs  Lab 07/10/23 1552  TSH 3.879    BNP Recent Labs  Lab 07/10/23 1548  BNP 899.4*    DDimer No results for input(s): "DDIMER" in the last 168 hours.   Radiology    No results found.  Cardiac Studies  2D echo 07/13/2023 1. Left ventricular ejection fraction, by estimation, is 30 to 35%. The   left ventricle has moderately decreased function. The left ventricle  demonstrates global hypokinesis. Left ventricular diastolic parameters are  indeterminate.   2. Right ventricular systolic function is moderately reduced. The right  ventricular size is mildly enlarged. There is mildly elevated pulmonary  artery systolic pressure.   3. Right atrial size was moderately dilated.   4. The mitral valve is normal in structure. Moderate mitral valve  regurgitation. No evidence of mitral stenosis.   5. The tricuspid valve is degenerative. Tricuspid valve regurgitation is  severe.   6. The aortic valve is tricuspid. Aortic valve regurgitation is mild. No  aortic stenosis is present.   7. The inferior vena cava is dilated in size with <50% respiratory  variability, suggesting right atrial pressure of 15 mmHg.    Patient Profile     68 y.o. male with history of medication noncompliance, coronary disease, chronic atrial fibrillation on apixaban, hypertension, hyperlipidemia, chronic HFrEF with an LVEF 35-40%, frequent PVCs, alcohol abuse, recurrent alcohol pancreatitis who is being seen for further evaluation of CHF exacerbation and NSVT.  Assessment & Plan    Acute on chronic HFrEF/alcohol induced cardiomyopathy/anasarca/noncompliance with medical therapy -History of CHF since 11/2022.  Echocardiogram at that time revealed LVEF 35-40% with a history of noncompliance and alcoholism -Presented on today with DOE and volume overload with excessive drinking -Echocardiogram 2/11 revealed an LVEF of 30-35%, LV global hypokinesis, mildly elevated PA systolic pressure, mildly dilated RA, moderate MR, severe TR - -4.1 L output in the last 24 hours and -16.8 L output since admission -Serum creatinine has remained stable -Continued on furosemide 60 mg twice daily -Continue spironolactone 12.5 mg daily Toprol-XL 12.5 mg daily -Continue to escalate GDMT as tolerated by  blood pressure -Heart failure  education -Daily weights, I's and O's, low-sodium diet  Paroxysmal atrial fibrillation -Longstanding history of atrial fibrillation noncompliant with medications -EMS found patient in atrial fibrillation RVR rates of 200 bpm patient was lethargic and in respiratory distress -Patient was given Versed and shocked at 200 J -Continued on apixaban 5 mg twice daily for stroke prophylaxis, Toprol-XL 12.5 mg daily and digoxin at 0.65 mg daily -Recommend keeping potassium greater than 4 less than 5 magnesium greater than 2 -TSH was within normal limits -Continues to have PACs and PVCs on telemetry monitoring -Continue telemetry monitoring  NSVT -No further episodes -Continue metoprolol 12.5 mg daily -Continue with telemetry monitoring  Coronary artery disease -Left heart catheterization completed 11/2022 showed severe single-vessel CAD with good collaterals no targets for revascularization -Continues to deny chest pain -High-sensitivity troponins negative -Continue atorvastatin 40 mg daily -Continued on apixaban 5 mg twice daily and lieu of aspirin -EKG as needed for pain or changes  Hypokalemia -Resolved this morning with serum potassium of 3.7 -Monitor/trend/replete electrolytes as needed -Daily BMP  Hyponatremia with hypervolemia/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

## 2023-07-16 NOTE — Progress Notes (Signed)
HF CSW informed by HF Navigator that patient's appointment for HF clinic follow up has been moved to end of next week as he is still hospitalized. Patient has transport for next week so no further assistance for transport needed at this time. HF CSW available as needed. Lasandra Beech, LCSW, CCSW-MCS [phone]

## 2023-07-17 DIAGNOSIS — I5023 Acute on chronic systolic (congestive) heart failure: Secondary | ICD-10-CM | POA: Diagnosis not present

## 2023-07-17 DIAGNOSIS — I5043 Acute on chronic combined systolic (congestive) and diastolic (congestive) heart failure: Secondary | ICD-10-CM | POA: Diagnosis not present

## 2023-07-17 DIAGNOSIS — K7031 Alcoholic cirrhosis of liver with ascites: Secondary | ICD-10-CM | POA: Diagnosis not present

## 2023-07-17 DIAGNOSIS — I4891 Unspecified atrial fibrillation: Secondary | ICD-10-CM | POA: Diagnosis not present

## 2023-07-17 DIAGNOSIS — I420 Dilated cardiomyopathy: Secondary | ICD-10-CM | POA: Diagnosis not present

## 2023-07-17 LAB — CBC
HCT: 34 % — ABNORMAL LOW (ref 39.0–52.0)
Hemoglobin: 12.3 g/dL — ABNORMAL LOW (ref 13.0–17.0)
MCH: 32.3 pg (ref 26.0–34.0)
MCHC: 36.2 g/dL — ABNORMAL HIGH (ref 30.0–36.0)
MCV: 89.2 fL (ref 80.0–100.0)
Platelets: 251 10*3/uL (ref 150–400)
RBC: 3.81 MIL/uL — ABNORMAL LOW (ref 4.22–5.81)
RDW: 16.4 % — ABNORMAL HIGH (ref 11.5–15.5)
WBC: 4 10*3/uL (ref 4.0–10.5)
nRBC: 0 % (ref 0.0–0.2)

## 2023-07-17 LAB — BASIC METABOLIC PANEL
Anion gap: 11 (ref 5–15)
BUN: 14 mg/dL (ref 8–23)
CO2: 24 mmol/L (ref 22–32)
Calcium: 9.4 mg/dL (ref 8.9–10.3)
Chloride: 96 mmol/L — ABNORMAL LOW (ref 98–111)
Creatinine, Ser: 0.9 mg/dL (ref 0.61–1.24)
GFR, Estimated: >60 mL/min (ref >60)
Glucose, Bld: 151 mg/dL — ABNORMAL HIGH (ref 70–99)
Potassium: 3.3 mmol/L — ABNORMAL LOW (ref 3.5–5.1)
Sodium: 131 mmol/L — ABNORMAL LOW (ref 135–145)

## 2023-07-17 LAB — BRAIN NATRIURETIC PEPTIDE: B Natriuretic Peptide: 585.4 pg/mL — ABNORMAL HIGH (ref 0.0–100.0)

## 2023-07-17 LAB — PHOSPHORUS: Phosphorus: 1.8 mg/dL — ABNORMAL LOW (ref 2.5–4.6)

## 2023-07-17 LAB — MAGNESIUM: Magnesium: 1.8 mg/dL (ref 1.7–2.4)

## 2023-07-17 MED ORDER — ORAL CARE MOUTH RINSE: 15.0000 mL | OROMUCOSAL | Status: DC | PRN

## 2023-07-17 MED ORDER — POTASSIUM CHLORIDE CRYS ER 20 MEQ PO TBCR
40.0000 meq | EXTENDED_RELEASE_TABLET | Freq: Once | ORAL | Status: AC
  Administered 2023-07-17: 40 meq via ORAL
  Filled 2023-07-17: qty 2

## 2023-07-17 MED ORDER — POTASSIUM PHOSPHATES 15 MMOLE/5ML IV SOLN
30.0000 mmol | Freq: Once | INTRAVENOUS | Status: AC
  Administered 2023-07-17: 30 mmol via INTRAVENOUS
  Filled 2023-07-17: qty 10

## 2023-07-17 NOTE — Plan of Care (Signed)

## 2023-07-17 NOTE — Progress Notes (Signed)
PROGRESS NOTE    Darren Rogers  WUJ:[id number] DOB: February 11, 1956 DOA: 07/10/2023 PCP: Pcp, No  [id number]  LOS: 6 days   Brief hospital course:   Assessment & Plan: Darren Rogers is a 68 y.o. male with medical history significant of medication noncompliance, CAD, chronic A-fib on Eliquis and digoxin, HTN, HLD, chronic HFrEF with LVEF 35-40%, frequent PVCs, alcohol abuse and recurrent alcoholic pancreatitis presented with increasing shortness of breath, peripheral edema feeling malaise.  Patient drinks 48 ounces of beer twice a day, and not reported taking his medication occasionally.  on EMS arrival, pt found to have HR 190s-200s. Patient also lethargic for EMS.  Due to inability to obtain access, pt given IM versed and shocked by EMS at 200J.  Upon arriving to hospital, patient was found to have anasarca, chest x-ray showed cardiomegaly with pulmonary edema, significant elevated BNP.  Patient was started on diuretics for CHF exacerbation.   Acute respiratory failure with hypoxemia 2/2 Pulm edema Upon arriving the emergency room, patient had severe respiratory distress, oxygen saturation was as low as 78%, he was placed on a breather.  chest x-ray showed cardiomegaly with pulmonary edema.   --Pt has been weaned off supplemental oxygen after receiving diuresis.   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 Noncompliant with medical treatment. --presented with massive fluid overload.  current LVEF 30-35%.   --started on Lasix gtt, transitioned to bolus IV lasix on 2/13 Plan: --cont IV lasix as 80 mg BID --cont Aldactone (new) --cont Toprol --cont losartan 12.5 mg daily   Hypokalemia. hypophos --monitor and supplement PRN   Hyponatremia with hypervolemia. 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 Lactic acidosis secondary to liver failure. Alcohol  dependence. --Abdominal distention/ascites improving  --cont lasix and aldactone --alcohol cessation recommended   Single-vessel coronary disease. --cont statin --on Eliquis in place of ASA   Paroxysmal atrial fibrillation with RVR. --cont digoxin and Toprol --cont Eliquis   Nonsustained ventricular tachycardia. Patient had 13 beats of V. tach 2/10.  This is in the setting of acute congestive heart failure and electrolytes abnormality.   --cont Toprol   DVT prophylaxis: [id number] Code Status: Full code  Family Communication:  Level of care: Telemetry Cardiac Dispo:   The patient is from: home Anticipated d/c is to: home Anticipated d/c date is: 1-2 days   Subjective and Interval History:  Pt was concerned about putting out too much urine and losing too much "flesh".  Attempted to explain to pt that he is losing water weight, not muscle.  No dyspnea today.   Objective: Vitals:   07/17/23 0510 07/17/23 0945 07/17/23 1256 07/17/23 1549  BP:  101/63 119/77 114/79  Pulse:  83 87 81  Resp:      Temp:  97.8 F (36.6 C) 97.9 F (36.6 C) (!) 97.5 F (36.4 C)  TempSrc:      SpO2:  90% 95% 98%  Weight: 63.2 kg     Height:        Intake/Output Summary (Last 24 hours) at 07/17/2023 1708 Last data filed at 07/17/2023 1625 Gross per 24 hour  Intake 704.1 ml  Output 2750 ml  Net -2045.9 ml   Filed Weights   07/15/23 0530 07/16/23 0500 07/17/23 0510  Weight: 68.6 kg 65.8 kg 63.2 kg    Examination:   Constitutional: NAD, AAOx3 HEENT: conjunctivae and lids normal, EOMI CV: No cyanosis.   RESP: normal respiratory effort, on  RA Neuro: II - XII grossly intact.   Psych: Normal mood and affect.     Data Reviewed: I have personally reviewed labs and imaging studies  Time spent: 35 minutes  Darlin Priestly, MD [facility] If 7PM-7AM, please contact night-coverage 07/17/2023, 5:08 PM

## 2023-07-17 NOTE — Progress Notes (Signed)
Rounding Note    Patient Name: Darren Rogers Date of Encounter: 07/17/2023  [facility] Cardiologist: Lorine Bears, MD   Subjective   Laying in bed, complaining of discomfort from telemetry monitor, wiring Continues on Lasix IV twice daily Abdominal distention improving, leg swelling resolved " I will walk tomorrow" Has not worked with PT Alcohol cessation again discussed with him  Inpatient Medications    Scheduled Meds:  apixaban  5 mg Oral BID   atorvastatin  40 mg Oral Daily   digoxin  0.0625 mg Oral Daily   folic acid  1 mg Oral Daily   furosemide  80 mg Intravenous BID   losartan  12.5 mg Oral Daily   metoprolol succinate  12.5 mg Oral Daily   multivitamin with minerals  1 tablet Oral Daily   nicotine  21 mg Transdermal Daily   pantoprazole  40 mg Oral Daily   QUEtiapine  25 mg Oral QHS   sodium chloride flush  3 mL Intravenous Q12H   spironolactone  12.5 mg Oral Daily   thiamine  100 mg Oral Daily   Or   thiamine  100 mg Intravenous Daily   Continuous Infusions:  potassium PHOSPHATE IVPB (in mmol) 30 mmol (07/17/23 1233)   PRN Meds: acetaminophen, ondansetron (ZOFRAN) IV, polyethylene glycol, sodium chloride flush   Vital Signs    Vitals:   07/17/23 0123 07/17/23 0510 07/17/23 0945 07/17/23 1256  BP: 129/79  101/63 119/77  Pulse: 82  83 87  Resp: 19     Temp: 98.5 F (36.9 C)  97.8 F (36.6 C) 97.9 F (36.6 C)  TempSrc: Oral     SpO2: 99%  90% 95%  Weight:  63.2 kg    Height:        Intake/Output Summary (Last 24 hours) at 07/17/2023 1345 Last data filed at 07/17/2023 1100 Gross per 24 hour  Intake 240 ml  Output 1900 ml  Net -1660 ml      07/17/2023    5:10 AM 07/16/2023    5:00 AM 07/15/2023    5:30 AM  Last 3 Weights  Weight (lbs) 139 lb 5.3 oz 145 lb 1 oz 151 lb 3.8 oz  Weight (kg) 63.2 kg 65.8 kg 68.6 kg      Telemetry    Normal sinus rhythm- Personally Reviewed  ECG     - Personally Reviewed  Physical Exam    GEN: No acute distress.   Neck:  JVD 10+ Cardiac: RRR, no murmurs, rubs, or gallops.  Respiratory: Clear to auscultation bilaterally. GI: Soft, nontender, non-distended  MS: No edema; No deformity. Neuro:  Nonfocal  Psych: Normal affect   Labs    High Sensitivity Troponin:   Recent Labs  Lab 07/10/23 1548 07/10/23 1552  TROPONINIHS 12 12     Chemistry Recent Labs  Lab 07/10/23 1552 07/11/23 0430 07/12/23 0340 07/13/23 0231 07/15/23 0620 07/16/23 0645 07/17/23 0436  NA 125* 127* 132*   < > 132* 133* 131*  K 4.2 3.8 3.4*   < > 3.6 3.7 3.3*  CL 92* 95* 95*   < > 93* 94* 96*  CO2 17* 20* 26   < > 30 27 24   GLUCOSE 95 111* 88   < > 104* 136* 151*  BUN 13 15 15    < > 15 14 14   CREATININE 0.86 0.86 1.02   < > 1.03 0.98 0.90  CALCIUM 8.7* 8.6* 8.4*   < > 8.8* 9.2 9.4  MG 2.0  --  1.9   < > 2.0 2.0 1.8  PROT 6.5 5.8* 5.8*  --   --   --   --   ALBUMIN 3.3* 3.0* 3.0*  --  3.0*  --   --   AST 65* 50* 45*  --   --   --   --   ALT 35 32 28  --   --   --   --   ALKPHOS 165* 140* 132*  --   --   --   --   BILITOT 4.9* 4.3* 3.3*  --   --   --   --   GFRNONAA >60 >60 >60   < > >60 >60 >60  ANIONGAP 16* 12 11   < > 9 12 11    < > = values in this interval not displayed.    Lipids No results for input(s): "CHOL", "TRIG", "HDL", "LABVLDL", "LDLCALC", "CHOLHDL" in the last 168 hours.  Hematology Recent Labs  Lab 07/10/23 1548 07/16/23 0645 07/17/23 0436  WBC 4.5 4.1 4.0  RBC 3.87* 3.90* 3.81*  HGB 12.7* 12.7* 12.3*  HCT 35.8* 34.9* 34.0*  MCV 92.5 89.5 89.2  MCH 32.8 32.6 32.3  MCHC 35.5 36.4* 36.2*  RDW 17.2* 16.7* 16.4*  PLT 175 229 251   Thyroid  Recent Labs  Lab 07/10/23 1552  TSH 3.879    BNP Recent Labs  Lab 07/10/23 1548 07/17/23 0436  BNP 899.4* 585.4*    DDimer No results for input(s): "DDIMER" in the last 168 hours.   Radiology    No results found.  Cardiac Studies   Echo 1. Left ventricular ejection fraction, by estimation, is 30 to 35%.  The  left ventricle has moderately decreased function. The left ventricle  demonstrates global hypokinesis. Left ventricular diastolic parameters are  indeterminate.   2. Right ventricular systolic function is moderately reduced. The right  ventricular size is mildly enlarged. There is mildly elevated pulmonary  artery systolic pressure.   3. Right atrial size was moderately dilated.   4. The mitral valve is normal in structure. Moderate mitral valve  regurgitation. No evidence of mitral stenosis.   5. The tricuspid valve is degenerative. Tricuspid valve regurgitation is  severe.   6. The aortic valve is tricuspid. Aortic valve regurgitation is mild. No  aortic stenosis is present.   7. The inferior vena cava is dilated in size with <50% respiratory  variability, suggesting right atrial pressure of 15 mmHg.   Patient Profile    68 y.o. male with a hx of medication noncompliance, CAD, chronic atrial fibrillation on Eliquis and digoxin, HTN, HLD, chronic HFrEF with LVEF 35-40%, frequent PVCs, alcohol abuse, and recurrent alcoholic pancreatitis who is being seen for the further evaluation of CHF exacerbation and NSVT   Assessment & Plan    Acute on chronic diastolic and systolic CHF Presenting with respiratory distress, atrial fibrillation with RVR, massive fluid overload in the setting of chronic high-volume alcohol intake/beer -- Did well to IV Lasix this admission, -18.5 L -Continue IV Lasix twice daily with close monitoring of renal function which has held up well to date -Long discussion concerning CHF education, alcohol cessation medication compliance  Atrial fibrillation with RVR Longstanding history of atrial fibrillation, Has been noncompliant with medications in the past On Eliquis 5 twice daily As outpatient was prescribed digoxin 0.125 daily, metoprolol succinate 150 mg daily -Continue metoprolol succinate 12.5 daily, dosing limited secondary to hypotension   Nonsustained  VT Known severe single-vessel CAD, occlusion of distal left circumflex, nonobstructive LAD disease catheterization July 2024 -Short runs nonsustained VT noted on telemetry this admission, continue metoprolol succinate 12.5 daily  -Episodes seem to have improved with further diuresis   Coronary artery disease with stable angina Cardiac catheterization July 2024 severe single-vessel coronary disease left circumflex with collaterals left-to-left -Denies anginal symptoms, no plans for ischemic workup -On Eliquis in place of aspirin,  -Continue metoprolol succinate 12.5 daily, statin/Lipitor 40 daily   Cardiomyopathy Ejection fraction 30 to 35%, felt to have alcohol cardiomyopathy component Alcohol cessation again discussed with him on today's rounds -GDMT limited secondary to hypotension -Continue Lasix 40 IV twice daily, continue metoprolol succinate 12.5 daily, low-dose digoxin, low-dose losartan 12.5 daily  Weakness Recommend he work with PT, spend time out of bed He has expressed his desire to go home at discharge, has not walked this admission Order for PT placed   Alcohol abuse, 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

## 2023-07-18 DIAGNOSIS — F1020 Alcohol dependence, uncomplicated: Secondary | ICD-10-CM | POA: Diagnosis not present

## 2023-07-18 DIAGNOSIS — I5023 Acute on chronic systolic (congestive) heart failure: Secondary | ICD-10-CM | POA: Diagnosis not present

## 2023-07-18 DIAGNOSIS — I4891 Unspecified atrial fibrillation: Secondary | ICD-10-CM | POA: Diagnosis not present

## 2023-07-18 DIAGNOSIS — I420 Dilated cardiomyopathy: Secondary | ICD-10-CM | POA: Diagnosis not present

## 2023-07-18 LAB — BASIC METABOLIC PANEL
Anion gap: 13 (ref 5–15)
BUN: 17 mg/dL (ref 8–23)
CO2: 25 mmol/L (ref 22–32)
Calcium: 9.5 mg/dL (ref 8.9–10.3)
Chloride: 93 mmol/L — ABNORMAL LOW (ref 98–111)
Creatinine, Ser: 0.85 mg/dL (ref 0.61–1.24)
GFR, Estimated: >60 mL/min (ref >60)
Glucose, Bld: 130 mg/dL — ABNORMAL HIGH (ref 70–99)
Potassium: 3.3 mmol/L — ABNORMAL LOW (ref 3.5–5.1)
Sodium: 131 mmol/L — ABNORMAL LOW (ref 135–145)

## 2023-07-18 LAB — CBC
HCT: 35.9 % — ABNORMAL LOW (ref 39.0–52.0)
Hemoglobin: 12.5 g/dL — ABNORMAL LOW (ref 13.0–17.0)
MCH: 32 pg (ref 26.0–34.0)
MCHC: 34.8 g/dL (ref 30.0–36.0)
MCV: 91.8 fL (ref 80.0–100.0)
Platelets: 280 10*3/uL (ref 150–400)
RBC: 3.91 MIL/uL — ABNORMAL LOW (ref 4.22–5.81)
RDW: 16.3 % — ABNORMAL HIGH (ref 11.5–15.5)
WBC: 4 10*3/uL (ref 4.0–10.5)
nRBC: 0 % (ref 0.0–0.2)

## 2023-07-18 LAB — MAGNESIUM: Magnesium: 1.7 mg/dL (ref 1.7–2.4)

## 2023-07-18 LAB — GLUCOSE, CAPILLARY: Glucose-Capillary: 136 mg/dL — ABNORMAL HIGH (ref 70–99)

## 2023-07-18 LAB — TROPONIN I (HIGH SENSITIVITY)
Troponin I (High Sensitivity): 19 ng/L — ABNORMAL HIGH (ref <18)
Troponin I (High Sensitivity): 19 ng/L — ABNORMAL HIGH (ref <18)

## 2023-07-18 LAB — PHOSPHORUS: Phosphorus: 4.9 mg/dL — ABNORMAL HIGH (ref 2.5–4.6)

## 2023-07-18 MED ORDER — POTASSIUM CHLORIDE CRYS ER 20 MEQ PO TBCR
40.0000 meq | EXTENDED_RELEASE_TABLET | Freq: Every day | ORAL | Status: DC
  Filled 2023-07-18: qty 2

## 2023-07-18 MED ORDER — AMIODARONE HCL IN DEXTROSE 360-4.14 MG/200ML-% IV SOLN
60.0000 mg/h | INTRAVENOUS | Status: DC
  Administered 2023-07-18 (×2): 60 mg/h via INTRAVENOUS
  Filled 2023-07-18 (×2): qty 200

## 2023-07-18 MED ORDER — AMIODARONE LOAD VIA INFUSION
150.0000 mg | Freq: Once | INTRAVENOUS | Status: AC
  Administered 2023-07-18: 150 mg via INTRAVENOUS
  Filled 2023-07-18: qty 83.34

## 2023-07-18 MED ORDER — NITROGLYCERIN 0.4 MG SL SUBL
0.4000 mg | SUBLINGUAL_TABLET | SUBLINGUAL | Status: DC | PRN
  Administered 2023-07-18: 0.4 mg via SUBLINGUAL

## 2023-07-18 MED ORDER — METHOCARBAMOL 500 MG PO TABS
750.0000 mg | ORAL_TABLET | Freq: Three times a day (TID) | ORAL | Status: DC | PRN
  Administered 2023-07-18: 750 mg via ORAL
  Filled 2023-07-18: qty 2

## 2023-07-18 MED ORDER — AMIODARONE HCL IN DEXTROSE 360-4.14 MG/200ML-% IV SOLN
30.0000 mg/h | INTRAVENOUS | Status: DC
Start: 2023-07-19 — End: 2023-07-21
  Administered 2023-07-19 – 2023-07-21 (×5): 30 mg/h via INTRAVENOUS
  Filled 2023-07-18 (×5): qty 200

## 2023-07-18 MED ORDER — FUROSEMIDE 40 MG PO TABS
40.0000 mg | ORAL_TABLET | Freq: Two times a day (BID) | ORAL | Status: DC
  Administered 2023-07-20 – 2023-07-23 (×7): 40 mg via ORAL
  Filled 2023-07-18 (×9): qty 1

## 2023-07-18 MED ORDER — POTASSIUM CHLORIDE 20 MEQ PO PACK
40.0000 meq | PACK | Freq: Once | ORAL | Status: AC
  Administered 2023-07-18: 40 meq via ORAL
  Filled 2023-07-18: qty 2

## 2023-07-18 MED ORDER — DAPAGLIFLOZIN PROPANEDIOL 10 MG PO TABS
10.0000 mg | ORAL_TABLET | Freq: Every day | ORAL | Status: DC
  Administered 2023-07-18 – 2023-07-23 (×5): 10 mg via ORAL
  Filled 2023-07-18 (×6): qty 1

## 2023-07-18 MED ORDER — POTASSIUM CHLORIDE CRYS ER 20 MEQ PO TBCR
40.0000 meq | EXTENDED_RELEASE_TABLET | Freq: Once | ORAL | Status: AC
  Administered 2023-07-18: 40 meq via ORAL
  Filled 2023-07-18: qty 2

## 2023-07-18 MED ORDER — POTASSIUM CHLORIDE CRYS ER 20 MEQ PO TBCR: 40.0000 meq | EXTENDED_RELEASE_TABLET | Freq: Once | ORAL | Status: DC

## 2023-07-18 NOTE — Progress Notes (Signed)
Rounding Note    Patient Name: Darren Rogers Date of Encounter: 07/18/2023  [facility] Cardiologist: Lorine Bears, MD   Subjective   Patient seen on AM rounds. Denies any chest pain or shortness of breath. -2.4 L output in the last 24 hours. Refusing IV lasix this morning.  States that he has not been out of the bed since he arrived to the hospital.  Inpatient Medications    Scheduled Meds:  apixaban  5 mg Oral BID   atorvastatin  40 mg Oral Daily   dapagliflozin propanediol  10 mg Oral Daily   digoxin  0.0625 mg Oral Daily   folic acid  1 mg Oral Daily   furosemide  40 mg Oral BID   losartan  12.5 mg Oral Daily   metoprolol succinate  12.5 mg Oral Daily   multivitamin with minerals  1 tablet Oral Daily   nicotine  21 mg Transdermal Daily   pantoprazole  40 mg Oral Daily   potassium chloride  40 mEq Oral Once   QUEtiapine  25 mg Oral QHS   sodium chloride flush  3 mL Intravenous Q12H   spironolactone  12.5 mg Oral Daily   thiamine  100 mg Oral Daily   Or   thiamine  100 mg Intravenous Daily   Continuous Infusions:  PRN Meds: acetaminophen, ondansetron (ZOFRAN) IV, mouth rinse, polyethylene glycol, sodium chloride flush   Vital Signs    Vitals:   07/18/23 0332 07/18/23 0500 07/18/23 0944 07/18/23 1016  BP: 120/76   121/68  Pulse: 86  85 82  Resp: 17   19  Temp: 98.1 F (36.7 C)   97.9 F (36.6 C)  TempSrc: Oral   Oral  SpO2: 99%   100%  Weight:  63.4 kg    Height:        Intake/Output Summary (Last 24 hours) at 07/18/2023 1032 Last data filed at 07/18/2023 0017 Gross per 24 hour  Intake 1141.26 ml  Output 3600 ml  Net -2458.74 ml      07/18/2023    5:00 AM 07/17/2023    5:10 AM 07/16/2023    5:00 AM  Last 3 Weights  Weight (lbs) 139 lb 12.4 oz 139 lb 5.3 oz 145 lb 1 oz  Weight (kg) 63.4 kg 63.2 kg 65.8 kg      Telemetry    Sinus rhythm with occasional unifocal PVC rates in the 80s with 1 5 beat run of NSVT overnight- Personally  Reviewed  ECG    No new tracings - Personally Reviewed  Physical Exam   GEN: No acute distress.   Neck: No JVD Cardiac: RRR, no murmurs, rubs, or gallops.  Respiratory: Clear with diminished bases to auscultation bilaterally. Respirations are unlabored at rest on 2L O2 via [place] GI: Soft, nontender, non-distended  MS: No edema; No deformity. Neuro:  Nonfocal  Psych: Normal affect   Labs    High Sensitivity Troponin:   Recent Labs  Lab 07/10/23 1548 07/10/23 1552  TROPONINIHS 12 12     Chemistry Recent Labs  Lab 07/12/23 0340 07/13/23 0231 07/15/23 0620 07/16/23 0645 07/17/23 0436 07/18/23 0315  NA 132*   < > 132* 133* 131* 131*  K 3.4*   < > 3.6 3.7 3.3* 3.3*  CL 95*   < > 93* 94* 96* 93*  CO2 26   < > 30 27 24 25   GLUCOSE 88   < > 104* 136* 151* 130*  BUN 15   < >  15 14 14 17   CREATININE 1.02   < > 1.03 0.98 0.90 0.85  CALCIUM 8.4*   < > 8.8* 9.2 9.4 9.5  MG 1.9   < > 2.0 2.0 1.8 1.7  PROT 5.8*  --   --   --   --   --   ALBUMIN 3.0*  --  3.0*  --   --   --   AST 45*  --   --   --   --   --   ALT 28  --   --   --   --   --   ALKPHOS 132*  --   --   --   --   --   BILITOT 3.3*  --   --   --   --   --   GFRNONAA >60   < > >60 >60 >60 >60  ANIONGAP 11   < > 9 12 11 13    < > = values in this interval not displayed.    Lipids No results for input(s): "CHOL", "TRIG", "HDL", "LABVLDL", "LDLCALC", "CHOLHDL" in the last 168 hours.  Hematology Recent Labs  Lab 07/16/23 0645 07/17/23 0436 07/18/23 0315  WBC 4.1 4.0 4.0  RBC 3.90* 3.81* 3.91*  HGB 12.7* 12.3* 12.5*  HCT 34.9* 34.0* 35.9*  MCV 89.5 89.2 91.8  MCH 32.6 32.3 32.0  MCHC 36.4* 36.2* 34.8  RDW 16.7* 16.4* 16.3*  PLT 229 251 280   Thyroid No results for input(s): "TSH", "FREET4" in the last 168 hours.  BNP Recent Labs  Lab 07/17/23 0436  BNP 585.4*    DDimer No results for input(s): "DDIMER" in the last 168 hours.   Radiology    No results found.  Cardiac Studies   2D echo 07/13/2023 1.  Left ventricular ejection fraction, by estimation, is 30 to 35%. The  left ventricle has moderately decreased function. The left ventricle  demonstrates global hypokinesis. Left ventricular diastolic parameters are  indeterminate.   2. Right ventricular systolic function is moderately reduced. The right  ventricular size is mildly enlarged. There is mildly elevated pulmonary  artery systolic pressure.   3. Right atrial size was moderately dilated.   4. The mitral valve is normal in structure. Moderate mitral valve  regurgitation. No evidence of mitral stenosis.   5. The tricuspid valve is degenerative. Tricuspid valve regurgitation is  severe.   6. The aortic valve is tricuspid. Aortic valve regurgitation is mild. No  aortic stenosis is present.   7. The inferior vena cava is dilated in size with <50% respiratory  variability, suggesting right atrial pressure of 15 mmHg.     Patient Profile     68 y.o. male with a past medical history of medication noncompliance, coronary artery disease, chronic atrial fibrillation on apixaban, hypertension, hyperlipidemia, chronic HFrEF, and an LVEF of 35-40%, frequent PVCs, alcohol abuse, recurrent alcoholic pancreatitis, who is being seen for further exacerbation of CHF and NSVT.  Assessment & Plan    Acute on chronic combined heart failure/alcohol induced cardiomyopathy/anasarca/longstanding history of noncompliance with medical therapy -History of CHF since 11/2022 echocardiogram at that time revealed LVEF of 35-40% with a history of noncompliance and alcoholism -Echocardiogram 2/11 revealed LVEF of 30-35%, LV global hypokinesis, mildly elevated PA systolic pressures, mildly dilated RV, moderate MR, severe TR --2.4 L output in the last 24 hours -Serum creatinine has remained stable -Refused IV lasix, changed to oral Lasix 40 mg twice daily -started on Farxiga 10  mg daily -Continued on spironolactone 12.5 mg daily and Toprol-XL 12.5 mg  daily -Continue to escalate GDMT as tolerated by blood pressure and kidney function -Heart failure education -Daily weights, I's and O's, low-sodium diet  Paroxysmal atrial fibrillation -Longstanding history of atrial fibrillation noncompliant with medications -EMS found patient to be in atrial fibrillation RVR with rates of 200 bpm patient was lethargic and in respiratory distress -Ultimately given Versed instructed 200 J -Continued on apixaban 5 mg twice daily for stroke prophylaxis, Toprol-XL 12.5 mg daily and digoxin 0.065 mg daily -Recommend keeping potassium greater than 4 less than 5 and magnesium greater than 2 -TSH was within normal limits -Continues to have PACs and PVCs on telemetry monitoring -Continue with telemetry monitoring  CAD with stabe angina -Left heart catheterization completed 11/2022 showed severe single-vessel CAD with good collaterals and no targets for revascularization -Continues to deny chest pain -High-sensitivity troponins were negative -Continued on atorvastatin 40 mg daily -Continued on apixaban 5 mg twice daily and lieu of aspirin -EKG as needed for pain or changes  Hypokalemia -With serum potassium 3.3 -Supplementations given this morning -Monitor/trend/replete electrolytes as needed -Daily BMP  NSVT -1 episode noted on telemetry monitoring overnight -Potassium replacement ordered -Continued on Toprol-XL 12.5 mg daily -Can consider up titration if recurrent episodes -Continued on telemetry  Weakness -Recommend he work with physical therapy order placed this morning -Overall poor nutrition and muscle atrophy in his legs  Alcohol abuse, 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

## 2023-07-18 NOTE — Progress Notes (Signed)
Notified MD of chest pain 5/10. See new orders. EKG strip sent by tele monitors to chart.

## 2023-07-18 NOTE — Progress Notes (Signed)
MEWS Progress Note  Patient Details Name: Darren Rogers MRN: [id number] DOB: August 13, 1955 Today's Date: 07/18/2023   MEWS Flowsheet Documentation:  Assess: MEWS Score Temp: 98.6 F (37 C) BP: 126/85 MAP (mmHg): 96 Pulse Rate: (!) 139 ECG Heart Rate: (!) 142 Resp: (!) 24 Level of Consciousness: Alert SpO2: 99 % O2 Device: Nasal Cannula O2 Flow Rate (L/min): 2 L/min Assess: MEWS Score MEWS Temp: 0 MEWS Systolic: 0 MEWS Pulse: 3 MEWS RR: 1 MEWS LOC: 0 MEWS Score: 4 MEWS Score Color: Red Assess: SIRS CRITERIA SIRS Temperature : 0 SIRS Respirations : 1 SIRS Pulse: 1 SIRS WBC: 0 SIRS Score Sum : 2 SIRS Temperature : 0 SIRS Pulse: 1 SIRS Respirations : 1 SIRS WBC: 0 SIRS Score Sum : 2 Assess: if the MEWS score is Yellow or Red Were vital signs accurate and taken at a resting state?: (P) Yes Does the patient meet 2 or more of the SIRS criteria?: (P) No MEWS guidelines implemented : (P) Yes, red Treat MEWS Interventions: (P) Considered administering scheduled or prn medications/treatments as ordered Take Vital Signs Increase Vital Sign Frequency : (P) Red: Q1hr x2, continue Q4hrs until patient remains green for 12hrs Escalate MEWS: Escalate: (P) Red: Discuss with charge nurse and notify provider. Consider notifying RRT. If remains red for 2 hours consider need for higher level of care Notify: Charge Nurse/RN Name of Charge Nurse/RN Notified: (P) Praweena RN Provider Notification Provider Name/Title: (P) Dr Fran Lowes Date Provider Notified: (P) 07/18/23 Time Provider Notified: (P) 1800 Method of Notification: (P) Page Notification Reason: (P) Change in status Provider response: (P) At bedside Date of Provider Response: (P) 07/18/23 Time of Provider Response: (P) 1801 Notify: Rapid Response Name of Rapid Response RN Notified: ICU RN Date Rapid Response Notified: 07/18/23 Time Rapid Response Notified: 1800   Rapid response called. EKG showed STEMI. Code STEMI called. Dr  Fran Lowes spoke with on call STEMI MD. STEMI call cancelled by MD. Pt is currently pain free after x1 SL nitroglycerin.   Adair Laundry 07/18/2023, 6:28 PM

## 2023-07-18 NOTE — Evaluation (Signed)
Physical Therapy Evaluation Patient Details Name: Darren Rogers MRN: [id number] DOB: January 27, 1956 Today's Date: 07/18/2023  History of Present Illness  Darren Rogers is a 68 y.o. male with medical history significant of medication noncompliance, CAD, chronic A-fib on Eliquis and digoxin, HTN, HLD, chronic HFrEF with LVEF 35-40%, frequent PVCs, alcohol abuse and recurrent alcoholic pancreatitis presented with increasing shortness of breath, peripheral edema feeling malaise.  Clinical Impression  Pt received in bed agreeable ot PT interventions with encouragement. Pt c/o pain in B calf with movement and WB activities. Pt lives with sister who cooks for him and drives to Appointments. PT assessment revealed Pt need Min assist to Transfer and ambulate with poor postural alignment using FWW. Pt Ind with be d mobility. Homan's positive without signs of Redness or Warmth.  Pt needs encouragement to use bathroom instead of Bedpan. PT communicated with Nurse to increase OOB activities. PT referred pt to mobility specialist and will continue in acute care;. Pt will benefit from continued PT beyond Acute to return to PLOF and improve safety.       If plan is discharge home, recommend the following: A little help with walking and/or transfers;A little help with bathing/dressing/bathroom;Assistance with cooking/housework;Direct supervision/assist for medications management;Assist for transportation;Help with stairs or ramp for entrance   Can travel by private vehicle        Equipment Recommendations Rolling walker (2 wheels)  Recommendations for Other Services       Functional Status Assessment Patient has had a recent decline in their functional status and demonstrates the ability to make significant improvements in function in a reasonable and predictable amount of time.     Precautions / Restrictions Precautions Precautions: Fall Restrictions Weight Bearing Restrictions Per Provider Order: No       Mobility  Bed Mobility Overal bed mobility: Independent             General bed mobility comments: slow but Ind.    Transfers Overall transfer level: Needs assistance Equipment used: Rolling walker (2 wheels) Transfers: Sit to/from Stand, Bed to chair/wheelchair/BSC Sit to Stand: Min assist   Step pivot transfers: Min assist       General transfer comment: relies heavily on FWW and Forward flexed posutre.    Ambulation/Gait Ambulation/Gait assistance: Min assist, Contact guard assist Gait Distance (Feet): 9 Feet (total) Assistive device: Rolling walker (2 wheels) Gait Pattern/deviations: Decreased step length - right, Decreased step length - left Gait velocity: dec     General Gait Details: slow decreased stride and relies heavily on FWW and Forward flexed posutre.  Stairs            Wheelchair Mobility     Tilt Bed    Modified Rankin (Stroke Patients Only)       Balance Overall balance assessment: Needs assistance   Sitting balance-Leahy Scale: Good Sitting balance - Comments: No LOB noted.   Standing balance support: Bilateral upper extremity supported Standing balance-Leahy Scale: Fair Standing balance comment: relies heavily on FWW and Forward flexed posutre.                             Pertinent Vitals/Pain Pain Assessment Pain Assessment: 0-10 Pain Score: 7  Pain Location: Both calfs Pain Descriptors / Indicators: Aching Pain Intervention(s): Monitored during session    Home Living   Living Arrangements: Other relatives                 Additional Comments:  sister drives patient and cooks for him otherwise pt orders out.    Prior Function Prior Level of Function : Independent/Modified Independent;History of Falls (last six months)             Mobility Comments: very limited distance secondary to leg pain (reports needing to see vascular surgery). Amb with no AD. Hx of falls from dizziness. Pt states that  he has a chair placed halfway to his mailbox where he sits to take a break. ADLs Comments: Pt ambulates independently short distances without AD, performs ADLs Ind, and shares IADLs with sister. Sister drives them. sister does most cooking/grocery shopping, or they go out to eat.     Extremity/Trunk Assessment   Upper Extremity Assessment Upper Extremity Assessment: Overall WFL for tasks assessed    Lower Extremity Assessment Lower Extremity Assessment: Generalized weakness       Communication   Communication Communication: No apparent difficulties    Cognition Arousal: Alert Behavior During Therapy: WFL for tasks assessed/performed   PT - Cognitive impairments: No apparent impairments                         Following commands: Intact       Cueing       General Comments      Exercises General Exercises - Lower Extremity Ankle Circles/Pumps: AROM, Both, 10 reps, Seated Heel Slides: AROM, Both, 10 reps, Supine   Assessment/Plan    PT Assessment Patient needs continued PT services  PT Problem List Decreased strength;Decreased range of motion;Decreased activity tolerance;Decreased balance;Decreased mobility;Decreased safety awareness;Pain       PT Treatment Interventions Gait training;Stair training;Functional mobility training;Therapeutic activities;Therapeutic exercise;Balance training;Neuromuscular re-education;Patient/family education    PT Goals (Current goals can be found in the Care Plan section)  Acute Rehab PT Goals Patient Stated Goal: go home. PT Goal Formulation: With patient Time For Goal Achievement: 08/01/23 Potential to Achieve Goals: Good    Frequency Min 2X/week     Co-evaluation               AM-PAC PT "6 Clicks" Mobility  Outcome Measure Help needed turning from your back to your side while in a flat bed without using bedrails?: None Help needed moving from lying on your back to sitting on the side of a flat bed without  using bedrails?: None Help needed moving to and from a bed to a chair (including a wheelchair)?: A Little Help needed standing up from a chair using your arms (e.g., wheelchair or bedside chair)?: A Little Help needed to walk in hospital room?: A Little Help needed climbing 3-5 steps with a railing? : Total 6 Click Score: 18    End of Session Equipment Utilized During Treatment: Gait belt Activity Tolerance: Patient limited by pain Patient left: in chair;with call bell/phone within reach;with chair alarm set Nurse Communication: Mobility status PT Visit Diagnosis: Unsteadiness on feet (R26.81);Difficulty in walking, not elsewhere classified (R26.2);Muscle weakness (generalized) (M62.81);History of falling (Z91.81);Pain    Time: 1420-1449 PT Time Calculation (min) (ACUTE ONLY): 29 min   Charges:   PT Evaluation $PT Eval Low Complexity: 1 Low PT Treatments $Therapeutic Activity: 8-22 mins PT General Charges $$ ACUTE PT VISIT: 1 Visit         Janet Berlin PT DPT 4:25 PM,07/18/23

## 2023-07-18 NOTE — Progress Notes (Signed)
PROGRESS NOTE    Darren Rogers  WUJ:[id number] DOB: 01-09-1956 DOA: 07/10/2023 PCP: Pcp, No  [id number]  LOS: 7 days   Brief hospital course:   Assessment & Plan: Darren Rogers is a 68 y.o. male with medical history significant of medication noncompliance, CAD, chronic A-fib on Eliquis and digoxin, HTN, HLD, chronic HFrEF with LVEF 35-40%, frequent PVCs, alcohol abuse and recurrent alcoholic pancreatitis presented with increasing shortness of breath, peripheral edema feeling malaise.  Patient drinks 48 ounces of beer twice a day, and not reported taking his medication occasionally.  on EMS arrival, pt found to have HR 190s-200s. Patient also lethargic for EMS.  Due to inability to obtain access, pt given IM versed and shocked by EMS at 200J.  Upon arriving to hospital, patient was found to have anasarca, chest x-ray showed cardiomegaly with pulmonary edema, significant elevated BNP.  Patient was started on diuretics for CHF exacerbation.   Acute respiratory failure with hypoxemia 2/2 Pulm edema Upon arriving the emergency room, patient had severe respiratory distress, oxygen saturation was as low as 78%, he was placed on a breather.  chest x-ray showed cardiomegaly with pulmonary edema.   --Pt has been weaned off supplemental oxygen after receiving diuresis.   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 Noncompliant with medical treatment. --presented with massive fluid overload.  current LVEF 30-35%.   --started on Lasix gtt, transitioned to bolus IV lasix on 2/13 Plan: --transition to oral lasix 40 BID  --cont Aldactone (new) --cont Toprol --cont losartan 12.5 mg daily --add Farxiga 10 mg daily   Hypokalemia. hypophos --monitor and supplement PRN   Hyponatremia with hypervolemia. 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 Lactic acidosis secondary to  liver failure. Alcohol dependence. --Abdominal distention/ascites improving  --cont lasix and aldactone --alcohol cessation recommended   Single-vessel coronary disease. --cont statin --on Eliquis in place of ASA   Paroxysmal atrial fibrillation with RVR. --cont digoxin and Toprol --cont Eliquis   Nonsustained ventricular tachycardia. Patient had 13 beats of V. tach 2/10.  This is in the setting of acute congestive heart failure and electrolytes abnormality.   --cont Toprol  Bilateral calf pain --unlikely to be DVT since pt has been on Eliquis.  Likely due to significant fluid shifts and lower potassium level. --supplement potassium   DVT prophylaxis: [id number] Code Status: Full code  Family Communication:  Level of care: Telemetry Cardiac Dispo:   The patient is from: home Anticipated d/c is to: home Anticipated d/c date is: tomorrow   Subjective and Interval History:  Pt refused IV lasix today.  Complained of bilateral calf pain.   Objective: Vitals:   07/18/23 0332 07/18/23 0500 07/18/23 0944 07/18/23 1016  BP: 120/76   121/68  Pulse: 86  85 82  Resp: 17   19  Temp: 98.1 F (36.7 C)   97.9 F (36.6 C)  TempSrc: Oral   Oral  SpO2: 99%   100%  Weight:  63.4 kg    Height:        Intake/Output Summary (Last 24 hours) at 07/18/2023 1627 Last data filed at 07/18/2023 1500 Gross per 24 hour  Intake 917.16 ml  Output 1550 ml  Net -632.84 ml   Filed Weights   07/16/23 0500 07/17/23 0510 07/18/23 0500  Weight: 65.8 kg 63.2 kg 63.4 kg    Examination:   Constitutional: NAD, AAOx3 HEENT: conjunctivae and lids normal, EOMI CV: No cyanosis.  RESP: normal respiratory effort Neuro: II - XII grossly intact.     Data Reviewed: I have personally reviewed labs and imaging studies  Time spent: 35 minutes  Darlin Priestly, MD [facility] If 7PM-7AM, please contact night-coverage 07/18/2023, 4:27 PM

## 2023-07-18 NOTE — Progress Notes (Signed)
VS completed after x1 round of SL nitroglycerin. Pt appears in NAD. States pain is still 4/10. Is laughing and joking with EKG tech at this time. Darren Rogers

## 2023-07-18 NOTE — Progress Notes (Signed)
Patient sitting up in bed eating, NAD. Amio bolus/gtt started at this time. Pt on tele monitor and VS machine for frequent vital signs.

## 2023-07-19 ENCOUNTER — Encounter: Payer: 59 | Admitting: Family

## 2023-07-19 DIAGNOSIS — I5023 Acute on chronic systolic (congestive) heart failure: Secondary | ICD-10-CM | POA: Diagnosis not present

## 2023-07-19 LAB — BASIC METABOLIC PANEL
Anion gap: 14 (ref 5–15)
BUN: 17 mg/dL (ref 8–23)
CO2: 23 mmol/L (ref 22–32)
Calcium: 9.5 mg/dL (ref 8.9–10.3)
Chloride: 95 mmol/L — ABNORMAL LOW (ref 98–111)
Creatinine, Ser: 0.78 mg/dL (ref 0.61–1.24)
GFR, Estimated: >60 mL/min (ref >60)
Glucose, Bld: 85 mg/dL (ref 70–99)
Potassium: 4.8 mmol/L (ref 3.5–5.1)
Sodium: 132 mmol/L — ABNORMAL LOW (ref 135–145)

## 2023-07-19 LAB — CBC
HCT: 44.9 % (ref 39.0–52.0)
Hemoglobin: 15.8 g/dL (ref 13.0–17.0)
MCH: 32.1 pg (ref 26.0–34.0)
MCHC: 35.2 g/dL (ref 30.0–36.0)
MCV: 91.3 fL (ref 80.0–100.0)
Platelets: 308 10*3/uL (ref 150–400)
RBC: 4.92 MIL/uL (ref 4.22–5.81)
RDW: 16.6 % — ABNORMAL HIGH (ref 11.5–15.5)
WBC: 5.2 10*3/uL (ref 4.0–10.5)
nRBC: 0 % (ref 0.0–0.2)

## 2023-07-19 LAB — PHOSPHORUS: Phosphorus: 2.7 mg/dL (ref 2.5–4.6)

## 2023-07-19 LAB — MAGNESIUM: Magnesium: 2.1 mg/dL (ref 1.7–2.4)

## 2023-07-19 MED ORDER — METOPROLOL TARTRATE 25 MG PO TABS
25.0000 mg | ORAL_TABLET | Freq: Four times a day (QID) | ORAL | Status: DC
  Administered 2023-07-19 – 2023-07-20 (×4): 25 mg via ORAL
  Filled 2023-07-19 (×5): qty 1

## 2023-07-19 NOTE — Progress Notes (Addendum)
Attempted to administer AM medications including Heart rate control and anticoagulants. Pt stated he wasn't taking anything at this time. Asked patient what was going on and pt stated "I'm tired of being bothered". Attempted to educated the patient on the importance of taking medication consistently. Pt verbalizes understanding and repeats that he's not taking his medicine right now. Stated "maybe later". Notified Dr Fran Lowes via epic chat. Awaiting response/orders. Patient continues to be a RED MEWS score for HR and RR, MD aware. Pt appears in NAD at this time. Alert and oriented x4.

## 2023-07-19 NOTE — Progress Notes (Signed)
Heart Failure Stewardship Pharmacy Note  PCP: Pcp, No PCP-Cardiologist: Lorine Bears, MD  HPI: Darren Rogers is a 68 y.o. male with former polysubstance use (cocaine, alcohol, tobacco), recurrent alcoholic pancreatitis, A fib on Eliquis and digoxin, HTN, HLD, depression, CAD, PAD with claudication, BPH, PVC who presented with heart racing, fall, and mild shortness of breath. Patient had been nonadherent to HF and AF medications including Eliquis prior to admission. On admission, BNP was 899.4, HS-troponin was 12, lactic acid was 2.6, digoxin level was <0.2, TSH was 3.879, ethanol was negative, and UDS was negative. Chest x-ray noted perihilar vascular congestion, mild lower zonal interstitial edema and small pleural effusions.    Pertinent cardiac history: TTE in 05/2015 showed LEF 60-65%. Moderate blockage noted on arterial doppler in 06/2015. Stress test in 06/2015 showed T wave inversion in I, II, III, V4, V5, V6 during stress. A 2% PVC burden was noted 04/2017 via long term event monitor. TTE 07/2020 showed LVEF 50-55%. TTE 20/2022 showed LVEF 55-60% and low normal RV function. Event monitor 04/2021 showed 100% AF burden with rate of 99. TTE 11/2022 showed LVEF down to 35-40%, normal RV, mild-moderate MR, moderate TR, mild AR, and moderate PR. LHC 11/2022 showed severe single-vessel disease with CTO of Lcx/LPLA with left-to-left collaterals. During that time PVR was 5, PCWP was 18, and CI was 2.1.   Pertinent Lab Values: Creatinine  Date Value Ref Range Status  09/21/2014 1.10 mg/dL Final    Comment:    1.61-0.96 NOTE: New Reference Range  08/07/14    Creatinine, Ser  Date Value Ref Range Status  07/19/2023 0.78 0.61 - 1.24 mg/dL Final   BUN  Date Value Ref Range Status  07/19/2023 17 8 - 23 mg/dL Final  04/54/0981 9 8 - 27 mg/dL Final  19/14/7829 15 mg/dL Final    Comment:    5-62 NOTE: New Reference Range  08/07/14    Potassium  Date Value Ref Range Status  07/19/2023 4.8  3.5 - 5.1 mmol/L Final  09/21/2014 3.5 mmol/L Final    Comment:    3.5-5.1 NOTE: New Reference Range  08/07/14    Sodium  Date Value Ref Range Status  07/19/2023 132 (L) 135 - 145 mmol/L Final  08/26/2021 140 134 - 144 mmol/L Final  09/21/2014 139 mmol/L Final    Comment:    135-145 NOTE: New Reference Range  08/07/14    B Natriuretic Peptide  Date Value Ref Range Status  07/17/2023 585.4 (H) 0.0 - 100.0 pg/mL Final    Comment:    Performed at [facility], [address] [place], [place] [address]   Magnesium  Date Value Ref Range Status  07/19/2023 2.1 1.7 - 2.4 mg/dL Final    Comment:    Performed at [facility], [address] [place], [place] [address]  09/21/2014 1.8 mg/dL Final    Comment:    9.6-2.9 THERAPEUTIC RANGE: 4-7 mg/dL TOXIC: > 10 mg/dL  ----------------------- NOTE: New Reference Range  08/07/14    Hemoglobin A1C  Date Value Ref Range Status  03/28/2014 6.4 (H) 4.2 - 6.3 % Final    Comment:    The American Diabetes Association recommends that a primary goal of therapy should be <7% and that physicians should reevaluate the treatment regimen in patients with HbA1c values consistently >8%.    HB A1C (BAYER DCA - WAIVED)  Date Value Ref Range Status  07/30/2016 6.1 <7.0 % Final    Comment:  Diabetic Adult            <7.0                                       Healthy Adult        4.3 - 5.7                                                           (DCCT/NGSP) American Diabetes Association's Summary of Glycemic Recommendations for Adults with Diabetes: Hemoglobin A1c <7.0%. More stringent glycemic goals (A1c <6.0%) may further reduce complications at the cost of increased risk of hypoglycemia.    Hgb A1c MFr Bld  Date Value Ref Range Status  03/22/2023 5.5 4.8 - 5.6 % Final    Comment:    (NOTE) Pre diabetes:          5.7%-6.4%  Diabetes:              >6.4%  Glycemic  control for   <7.0% adults with diabetes    Digoxin Level  Date Value Ref Range Status  07/15/2023 0.6 (L) 0.8 - 2.0 ng/mL Final    Comment:    Performed at [facility], [address] [place], [place] [address]   TSH  Date Value Ref Range Status  07/10/2023 3.879 0.350 - 4.500 uIU/mL Final    Comment:    Performed by a 3rd Generation assay with a functional sensitivity of <=0.01 uIU/mL. Performed at [facility], [address] [place], [place] [address]   08/26/2021 1.600 0.450 - 4.500 uIU/mL Final    Vital Signs: Admission weight: 184.7 lbs Temp:  [97.9 F (36.6 C)-98.6 F (37 C)] 98 F (36.7 C) (02/17 0450) Pulse Rate:  [82-140] 130 (02/17 0450) Cardiac Rhythm: Sinus tachycardia (02/16 1905) Resp:  [12-24] 21 (02/17 0700) BP: (98-145)/(68-97) 98/74 (02/17 0450) SpO2:  [94 %-100 %] 99 % (02/17 0148) Weight:  [63.7 kg (140 lb 6.9 oz)] 63.7 kg (140 lb 6.9 oz) (02/17 0451)  Intake/Output Summary (Last 24 hours) at 07/19/2023 0743 Last data filed at 07/19/2023 0056 Gross per 24 hour  Intake 240 ml  Output 800 ml  Net -560 ml    Current Heart Failure Medications:  Loop diuretic: Furosemide 40 mg PO BID Beta-Blocker: Metoprolol tartrate 25 mg every 6 hours  ACEI/ARB/ARNI: none MRA: spironolactone 12.5 mg daily SGLT2i: Farxiga 10 mg daily Other: Digoxin 0.0625 mg daily   Prior to admission Heart Failure Medications:  Patient reported filling medications at [facility], however, after speaking with them on the phone, there have been no prescriptions filled in the last year. After discussing this with the patient he admits to being off medications for a very long time.  Assessment: 1. Acute on chronic systolic heart failure (LVEF 35-40%), due to predominant NICM (high AF burden). NYHA class III symptoms.  -Symptoms: Significant improvement in shortness of breath and orthopnea. LEE appears resolved. Patient sleepy this  morning. -Volume: Appears euvolemic on exam. Creatinine and BUN are stable. Weight is down ~44 lbs form admission ans stable. LEE appears resolved. Transitioned to furosemide 40 mg PO BID.  -Hemodynamics: BP is stable and HR is elevated requiring BB escalation and IV  amiodarone. -BB: Metoprolol changed to tartrate and increased for high ventricular rates today. -ACEI/ARB/ARNI: Sherryll Burger was stopped for soft BP. Losartan was started without issues, but discontinued to allow BB titration given high rates. -MRA: On low dose spironolactone given BP. K was low, but after aggressive replacement is now 4.8. Can consider increasing prior to discharge pending BP and BMP.  -SGLT2i: Continue Farxiga 10 mg daily.  Plan: 1) Medication changes recommended at this time: -None  2) Patient assistance: -Copay for Clifton Custard, Jardiance, Eliquis, and Xarelto are $0 -Patient would like to receive medications via [facility] mail order pharmacy in the future for convenience.  3) Education: - Patient has been educated on current HF medications and potential additions to HF medication regimen - Patient verbalizes understanding that over the next few months, these medication doses may change and more medications may be added to optimize HF regimen - Patient has been educated on basic disease state pathophysiology and goals of therapy  Medication Assistance / Insurance Benefits Check: Does the patient have prescription insurance?    Type of insurance plan:  Does the patient qualify for medication assistance through manufacturers or grants? Pending  Eligible grants and/or patient assistance programs: Pending  Medication assistance applications in progress: Pending  Medication assistance applications approved: Pending Approved medication assistance renewals will be completed by: Pending  Outpatient Pharmacy: Prior to admission outpatient pharmacy: Not filling medications PTA Is the patient agreeable to  switch to [facility]?: Yes Is the patient willing to utilize a [facility] pharmacy at discharge?: Yes  Please do not hesitate to reach out with questions or concerns,  Enos Fling, PharmD, CPP, BCPS Heart Failure Pharmacist  Phone - [phone] 07/19/2023 7:43 AM

## 2023-07-19 NOTE — Progress Notes (Signed)
Progress Note  Patient Name: Darren Rogers Date of Encounter: 07/19/2023  Primary Cardiologist: Kirke Corin  Subjective   Developed atrial flutter with RVR with 2:1 AV block into the 140s bpm in the evening of 2/16, rhythm persists with rates in the 120s to 130s bpm this morning. Currently on IV amiodarone gtt along with Toprol XL 12.5 mg daily and digoxin 0.0625 mg daily. Troponin 19 with a delta troponin unchanged. Hypokalemia repleted from 3.3 to 4.8. Magnesium at goal. TSH normal on 07/10/23.   Denies chest pain or dyspnea. Notes palpitations. Initially declines changes to medications when discussed with him, then later agrees.   Inpatient Medications    Scheduled Meds:  apixaban  5 mg Oral BID   atorvastatin  40 mg Oral Daily   dapagliflozin propanediol  10 mg Oral Daily   digoxin  0.0625 mg Oral Daily   folic acid  1 mg Oral Daily   furosemide  40 mg Oral BID   losartan  12.5 mg Oral Daily   metoprolol succinate  12.5 mg Oral Daily   multivitamin with minerals  1 tablet Oral Daily   nicotine  21 mg Transdermal Daily   pantoprazole  40 mg Oral Daily   QUEtiapine  25 mg Oral QHS   sodium chloride flush  3 mL Intravenous Q12H   spironolactone  12.5 mg Oral Daily   thiamine  100 mg Oral Daily   Or   thiamine  100 mg Intravenous Daily   Continuous Infusions:  amiodarone 30 mg/hr (07/19/23 0553)   PRN Meds: acetaminophen, methocarbamol, nitroGLYCERIN, ondansetron (ZOFRAN) IV, mouth rinse, polyethylene glycol, sodium chloride flush   Vital Signs    Vitals:   07/19/23 0625 07/19/23 0645 07/19/23 0700 07/19/23 0815  BP:    (!) 125/91  Pulse:      Resp: 19 18 (!) 21 (!) 22  Temp:    99.3 F (37.4 C)  TempSrc:    Oral  SpO2:      Weight:      Height:        Intake/Output Summary (Last 24 hours) at 07/19/2023 0823 Last data filed at 07/19/2023 0056 Gross per 24 hour  Intake 240 ml  Output 800 ml  Net -560 ml   Filed Weights   07/17/23 0510 07/18/23 0500 07/19/23 0451   Weight: 63.2 kg 63.4 kg 63.7 kg    Telemetry    Atrial flutter with RVR, 120s to 130s bpm - Personally Reviewed  ECG    Atrial flutter with RVR, 2:1 AV block, global ST/T changes  - Personally Reviewed  Physical Exam   GEN: No acute distress.   Neck: No JVD. Cardiac: Tachycardic, no murmurs, rubs, or gallops.  Respiratory: Diminished breath sounds bilaterally with poor inspiratory effort.   GI: Soft, nontender, non-distended.   MS: No edema; No deformity. Neuro:  Alert and oriented x 3; Nonfocal.  Psych: Normal affect.  Labs    Chemistry Recent Labs  Lab 07/15/23 [phone] 07/16/23 0645 07/17/23 0436 07/18/23 0315 07/19/23 0337  NA 132*   < > 131* 131* 132*  K 3.6   < > 3.3* 3.3* 4.8  CL 93*   < > 96* 93* 95*  CO2 30   < > 24 25 23   GLUCOSE 104*   < > 151* 130* 85  BUN 15   < > 14 17 17   CREATININE 1.03   < > 0.90 0.85 0.78  CALCIUM 8.8*   < > 9.4 9.5 9.5  ALBUMIN 3.0*  --   --   --   --   GFRNONAA >60   < > >60 >60 >60  ANIONGAP 9   < > 11 13 14    < > = values in this interval not displayed.     Hematology Recent Labs  Lab 07/17/23 0436 07/18/23 0315 07/19/23 0337  WBC 4.0 4.0 5.2  RBC 3.81* 3.91* 4.92  HGB 12.3* 12.5* 15.8  HCT 34.0* 35.9* 44.9  MCV 89.2 91.8 91.3  MCH 32.3 32.0 32.1  MCHC 36.2* 34.8 35.2  RDW 16.4* 16.3* 16.6*  PLT 251 280 308    Cardiac EnzymesNo results for input(s): "TROPONINI" in the last 168 hours. No results for input(s): "TROPIPOC" in the last 168 hours.   BNP Recent Labs  Lab 07/17/23 0436  BNP 585.4*     DDimer No results for input(s): "DDIMER" in the last 168 hours.   Radiology    No results found.  Cardiac Studies   2D echo 07/13/2023: 1. Left ventricular ejection fraction, by estimation, is 30 to 35%. The  left ventricle has moderately decreased function. The left ventricle  demonstrates global hypokinesis. Left ventricular diastolic parameters are  indeterminate.   2. Right ventricular systolic function  is moderately reduced. The right  ventricular size is mildly enlarged. There is mildly elevated pulmonary  artery systolic pressure.   3. Right atrial size was moderately dilated.   4. The mitral valve is normal in structure. Moderate mitral valve  regurgitation. No evidence of mitral stenosis.   5. The tricuspid valve is degenerative. Tricuspid valve regurgitation is  severe.   6. The aortic valve is tricuspid. Aortic valve regurgitation is mild. No  aortic stenosis is present.   7. The inferior vena cava is dilated in size with <50% respiratory  variability, suggesting right atrial pressure of 15 mmHg.   Patient Profile     68 y.o. male with history of medication noncompliance, coronary artery disease, atrial fibrillation on apixaban, hypertension, hyperlipidemia, chronic HFrEF, and an LVEF of 35-40%, frequent PVCs, alcohol abuse, recurrent alcoholic pancreatitis, who is being seen for further exacerbation of CHF and Afib/flutter with RVR.  Assessment & Plan    1. Atrial flutter with RVR with PAF: -Long history of Afib with medication noncompliance -At time of EMS arrival, noted to be in Afib with RVR and lethargic with DCCV -Was maintaining sinus rhythm -Developed atrial flutter with 2:1 AV block in the evening of 2/16, rhythm persists at this time with rates in the 120s to 130s bpm -Continue IV amiodarone for rate control (limited options) -Transition Toprol XL to Lopressor 25 mg every 6 hours for added rate control -Remains on digoxin 0.0625 mg -Ultimately, may need TEE-guided DCCV if we continue to have difficulty in controlling his rates -Recent TSH normal -Hypokalemia repleted -Magnesium at goal  2. 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 and medical noncompliance: -History of cardiomyopathy since 11/2022 echocardiogram at that time revealed LVEF of 35-40% with a history of noncompliance and alcoholism -Echocardiogram 07/13/23 revealed LVEF  of 30-35%, LV global hypokinesis, mildly elevated PA systolic pressures, mildly dilated RV, moderate MR, severe TR -Documented UOP 20.3 L for the admission -Serum creatinine has remained stable -Refused IV lasix on 2/16, changed to oral Lasix 40 mg twice daily -Now on Farxiga 10 mg daily -Hold losartan for now for added BP room for escalation of beta blocker for added rate control -Continued on spironolactone 12.5 mg daily and digoxin 0.0625 mg  daily -Transition from Toprol XL to Lopressor as above for added rate control -Prior to discharge, consolidate back to Toprol given cardiomyopathy -Continue to escalate GDMT as tolerated by blood pressure and kidney function -Heart failure education -Daily weights, I's and O's, low-sodium diet  3. CAD with stabe angina -Left heart catheterization completed 11/2022 showed severe single-vessel CAD with good collaterals and no targets for revascularization -Continues to deny chest pain -High-sensitivity troponin flat trending and peaking at 19, likely secondary to demand ischemia in the setting of atrial flutter with RVR -Continued on atorvastatin 40 mg daily -Continued on apixaban 5 mg twice daily and lieu of aspirin -EKG as needed for pain or changes   Hypokalemia -Repleted   NSVT -No further episodes -Pharmacotherapy as above -Maintain potassium and magnesium at goal 4.0 and 2.0, respectively    Weakness -Recommend he work with physical therapy order placed this morning -Overall poor nutrition and muscle atrophy in his legs   Alcohol abuse, 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    Signed, Eula Listen, PA-C [facility] Pager: [phone] 07/19/2023, 8:23 AM

## 2023-07-19 NOTE — Progress Notes (Signed)
PROGRESS NOTE    Darren Rogers  ZOX:[id number] DOB: 13-Jan-1956 DOA: 07/10/2023 PCP: Pcp, No  [id number]/[id number]-AA  LOS: 8 days   Brief hospital course:   Assessment & Plan: Darren Rogers is a 68 y.o. male with medical history significant of medication noncompliance, CAD, chronic A-fib on Eliquis and digoxin, HTN, HLD, chronic HFrEF with LVEF 35-40%, frequent PVCs, alcohol abuse and recurrent alcoholic pancreatitis presented with increasing shortness of breath, peripheral edema feeling malaise.  Patient drinks 48 ounces of beer twice a day, and not reported taking his medication occasionally.  on EMS arrival, pt found to have HR 190s-200s. Patient also lethargic for EMS.  Due to inability to obtain access, pt given IM versed and shocked by EMS at 200J.  Upon arriving to hospital, patient was found to have anasarca, chest x-ray showed cardiomegaly with pulmonary edema, significant elevated BNP.  Patient was started on diuretics for CHF exacerbation.   Acute respiratory failure with hypoxemia 2/2 Pulm edema Upon arriving the emergency room, patient had severe respiratory distress, oxygen saturation was as low as 78%, he was placed on a breather.  chest x-ray showed cardiomegaly with pulmonary edema.   --Pt has been weaned off supplemental oxygen after receiving diuresis.   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 Noncompliant with medical treatment. --presented with massive fluid overload.  current LVEF 30-35%.   --started on Lasix gtt, transitioned to bolus IV lasix on 2/13, and to oral lasix 40 BID on 2/16 Plan: --cont oral lasix 40 BID --cont Aldactone (new) --cont Toprol --d/c losartan today by cardio --cont Farxiga  Paroxysmal Afib/Aflutter with RVR. --converted to sinus rhythm on presentation after he underwent cardioversion by EMS.  Went back to [employer] w RVR evening of 2/16, started on  amiodarone bolus+gtt Plan: --cont amio gtt --cont digoxin and Toprol --cont Eliquis   Hypokalemia. hypophos --monitor and supplement PRN --pt prefers potassium powder to big pills   Hyponatremia with hypervolemia. 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 Lactic acidosis secondary to liver failure. Alcohol dependence. --Abdominal distention/ascites improving  --cont lasix and aldactone --alcohol cessation recommended   Single-vessel coronary disease. --cont statin --on Eliquis in place of ASA   Nonsustained ventricular tachycardia. Patient had 13 beats of V. tach 2/10.  This is in the setting of acute congestive heart failure and electrolytes abnormality.   --cont Toprol --cont amio gtt  Bilateral calf pain --unlikely to be DVT since pt has been on Eliquis.  Likely due to significant fluid shifts and lower potassium level. --supplement potassium   DVT prophylaxis: [id number] Code Status: Full code  Family Communication:  Level of care: Telemetry Cardiac Dispo:   The patient is from: home Anticipated d/c is to: home Anticipated d/c date is: 2-3 days   Subjective and Interval History:  Pt went into Aflutter w RVR yesterday evening.  Had chest pain that resolved with 1 dose of SL nitro.  Started on amiodarone gtt.  This morning, RN reported pt refusing all his meds.   Objective: Vitals:   07/19/23 1000 07/19/23 1151 07/19/23 1154 07/19/23 1508  BP: 114/83  104/68 119/89  Pulse: (!) 137     Resp:  (!) 27  20  Temp:   99.4 F (37.4 C) 98.2 F (36.8 C)  TempSrc:   Oral Oral  SpO2: 98%  97% 94%  Weight:      Height:        Intake/Output Summary (  Last 24 hours) at 07/19/2023 1730 Last data filed at 07/19/2023 1514 Gross per 24 hour  Intake 358 ml  Output 1425 ml  Net -1067 ml   Filed Weights   07/17/23 0510 07/18/23 0500 07/19/23 0451  Weight: 63.2 kg 63.4 kg 63.7 kg    Examination:   Constitutional: NAD, AAOx3 HEENT:  conjunctivae and lids normal, EOMI CV: No cyanosis.   RESP: normal respiratory effort, on RA Neuro: II - XII grossly intact.     Data Reviewed: I have personally reviewed labs and imaging studies  Time spent: 50 minutes  Darlin Priestly, MD [facility] If 7PM-7AM, please contact night-coverage 07/19/2023, 5:30 PM

## 2023-07-19 NOTE — TOC Progression Note (Addendum)
Transition of Care [facility]) - Progression Note    Patient Details  Name: Darren Rogers MRN: [id number] Date of Birth: [birth year]  [facility] CM/SW Contact  Truddie Hidden, RN Phone Number: 07/19/2023, 12:26 PM  Clinical Narrative:    Patient has HH recommendation for HHPT. He was previously active with [facility] in 03/2023. Message left for Maralyn Sago from [facility] regarding current status.    1:35pm Retrieved message from Maralyn Sago from [facility] stating patient is not active but they can see him at discharge.   2:30pm Spoke with patient at bedside regarding HH. Patient stated he is not ready to discharge "I can't walk." Patient ambulates with 5ft  MinA-CGA. RNCM reiterated HH PT rec. Patient advised he will need transportation home. He was advised RNCM can coordinate transportation. He is agreeable to [facility] via [facility].   Expected Discharge Plan: Home/Self Care Barriers to Discharge: Continued Medical Work up  Expected Discharge Plan and Services       Living arrangements for the past 2 months: Single Family Home                                       Social Determinants of Health (SDOH) Interventions SDOH Screenings   Food Insecurity: No Food Insecurity (07/13/2023)  Housing: Low Risk  (07/13/2023)  Transportation Needs: Unmet Transportation Needs (07/13/2023)  Utilities: Not At Risk (07/11/2023)  Financial Resource Strain: Low Risk  (07/13/2023)  Social Connections: Moderately Isolated (07/11/2023)  Tobacco Use: High Risk (07/13/2023)    Readmission Risk Interventions    07/14/2023    3:49 PM 12/21/2022    9:13 AM  Readmission Risk Prevention Plan  Transportation Screening Complete Complete  PCP or Specialist Appt within 3-5 Days  Complete  HRI or Home Care Consult  Complete  Social Work Consult for Recovery Care Planning/Counseling  Complete  Palliative Care Screening  Not Applicable  Medication Review [employer]) Complete Referral to Pharmacy  PCP or  Specialist appointment within 3-5 days of discharge Complete   HRI or Home Care Consult Complete   SW Recovery Care/Counseling Consult Complete   Palliative Care Screening Not Applicable   Skilled Nursing Facility Complete

## 2023-07-19 NOTE — Progress Notes (Signed)
Pt's HR in 130s at start of shift. Pt maintained HR above 120 overnight per bedside shift report. Notified Dr Fran Lowes via epic chat of patient's RED mews score of 4 at this time. Awaiting response. Pt is in NAD, resting. States he did not sleep and wants to be left alone.

## 2023-07-19 NOTE — Progress Notes (Signed)
MEWS Progress Note  Patient Details Name: Darren Rogers MRN: [id number] DOB: 07-Mar-1956 Today's Date: 07/19/2023   MEWS Flowsheet Documentation:  Assess: MEWS Score Temp: 99.3 F (37.4 C) BP: (!) 125/91 MAP (mmHg): 102 Pulse Rate: (!) 130 ECG Heart Rate: (!) 132 Resp: (!) 22 Level of Consciousness: Alert SpO2: 99 % O2 Device: Room Air O2 Flow Rate (L/min): 2 L/min Assess: MEWS Score MEWS Temp: 0 MEWS Systolic: 1 MEWS Pulse: 2 MEWS RR: 1 MEWS LOC: 0 MEWS Score: 4 MEWS Score Color: Red Assess: SIRS CRITERIA SIRS Temperature : 0 SIRS Respirations : 1 SIRS Pulse: 1 SIRS WBC: 0 SIRS Score Sum : 2 SIRS Temperature : 0 SIRS Pulse: 1 SIRS Respirations : 1 SIRS WBC: 0 SIRS Score Sum : 2 Assess: if the MEWS score is Yellow or Red Were vital signs accurate and taken at a resting state?: Yes Does the patient meet 2 or more of the SIRS criteria?: No MEWS guidelines implemented : No, previously red, continue vital signs every 4 hours Treat MEWS Interventions: Considered administering scheduled or prn medications/treatments as ordered Take Vital Signs Increase Vital Sign Frequency : Red: Q1hr x2, continue Q4hrs until patient remains green for 12hrs Escalate MEWS: Escalate: Red: Discuss with charge nurse and notify provider. Consider notifying RRT. If remains red for 2 hours consider need for higher level of care    Notified Dr Fran Lowes of patient's VS. Awaiting response. Patient continues to be a RED mews    Adair Laundry 07/19/2023, 8:21 AM

## 2023-07-20 DIAGNOSIS — I5023 Acute on chronic systolic (congestive) heart failure: Secondary | ICD-10-CM | POA: Diagnosis not present

## 2023-07-20 DIAGNOSIS — I4819 Other persistent atrial fibrillation: Secondary | ICD-10-CM | POA: Diagnosis not present

## 2023-07-20 MED ORDER — METOPROLOL TARTRATE 25 MG PO TABS
37.5000 mg | ORAL_TABLET | Freq: Four times a day (QID) | ORAL | Status: DC
  Administered 2023-07-20 – 2023-07-21 (×3): 37.5 mg via ORAL
  Filled 2023-07-20 (×3): qty 2

## 2023-07-20 MED ORDER — HYALURONIDASE HUMAN 150 UNIT/ML IJ SOLN
150.0000 [IU] | Freq: Once | INTRAMUSCULAR | Status: AC
Start: 2023-07-20 — End: 2023-07-20
  Administered 2023-07-20: 150 [IU] via SUBCUTANEOUS
  Filled 2023-07-20: qty 1

## 2023-07-20 NOTE — Progress Notes (Signed)
PT Cancellation Note  Patient Details Name: Darren Rogers MRN: [id number] DOB: 1955-07-27   Cancelled Treatment:    Reason Eval/Treat Not Completed: Other (comment) Pt laying in bed in no acute distress but when PT introduced idea of doing some exericses, mobility, ambulation, etc he stated "I just did a little with that lady (mobility tech) and I'm worn out."  He states he was able to do some LE exercises but when they stood his already hurting legs were "about to kill me" 2/2 pain and he had no interested in trying to do more exercises or WBing activity today.  He does report he would be willing to try tomorrow, hoping LE pain is better controlled.   Malachi Pro, DPT 07/20/2023, 3:45 PM

## 2023-07-20 NOTE — Progress Notes (Signed)
Mobility Specialist - Progress Note  Pre-mobility: HR 96, BP, SpO2 95%   07/20/23 1444  Mobility  Activity Dangled on edge of bed;Stood at bedside  Level of Assistance Standby assist, set-up cues, supervision of patient - no hands on  Assistive Device Front wheel walker  Activity Response Tolerated well  Mobility visit 1 Mobility  Mobility Specialist Start Time (ACUTE ONLY) 1407  Mobility Specialist Stop Time (ACUTE ONLY) 1431  Mobility Specialist Time Calculation (min) (ACUTE ONLY) 24 min   Pt supine upon entry, utilizing RA--O2 95%. Pt reported being tired and in pain, requiring motivation to participate in activity this date. Pt completed bed mob with MinA to bring trunk from sup to sit with cuing to utilize bed railing. Pt dangled EOB and completed seated marches, ankle pumps and circles indep. Pt STS to RW MinG, cuing for proper hand placement on the bed and RW-- WBOS, extra time required for Pt to bring BLE closer to person. Once standing Pt expressed pain in bilateral calf muscles, prompting a return to bed. Pt left supine with alarm set and needs within reach.   Zetta Bills Mobility Specialist 07/20/23 2:51 PM

## 2023-07-20 NOTE — Progress Notes (Signed)
Heart Failure Stewardship Pharmacy Note  PCP: Pcp, No PCP-Cardiologist: Lorine Bears, MD  HPI: Darren Rogers is a 68 y.o. male with former polysubstance use (cocaine, alcohol, tobacco), recurrent alcoholic pancreatitis, A fib on Eliquis and digoxin, HTN, HLD, depression, CAD, PAD with claudication, BPH, PVC who presented with heart racing, fall, and mild shortness of breath. Patient had been nonadherent to HF and AF medications including Eliquis prior to admission. On admission, BNP was 899.4, HS-troponin was 12, lactic acid was 2.6, digoxin level was <0.2, TSH was 3.879, ethanol was negative, and UDS was negative. Chest x-ray noted perihilar vascular congestion, mild lower zonal interstitial edema and small pleural effusions.    Pertinent cardiac history: TTE in 05/2015 showed LEF 60-65%. Moderate blockage noted on arterial doppler in 06/2015. Stress test in 06/2015 showed T wave inversion in I, II, III, V4, V5, V6 during stress. A 2% PVC burden was noted 04/2017 via long term event monitor. TTE 07/2020 showed LVEF 50-55%. TTE 20/2022 showed LVEF 55-60% and low normal RV function. Event monitor 04/2021 showed 100% AF burden with rate of 99. TTE 11/2022 showed LVEF down to 35-40%, normal RV, mild-moderate MR, moderate TR, mild AR, and moderate PR. LHC 11/2022 showed severe single-vessel disease with CTO of Lcx/LPLA with left-to-left collaterals. During that time PVR was 5, PCWP was 18, and CI was 2.1.   Pertinent Lab Values: Creatinine  Date Value Ref Range Status  09/21/2014 1.10 mg/dL Final    Comment:    1.61-0.96 NOTE: New Reference Range  08/07/14    Creatinine, Ser  Date Value Ref Range Status  07/19/2023 0.78 0.61 - 1.24 mg/dL Final   BUN  Date Value Ref Range Status  07/19/2023 17 8 - 23 mg/dL Final  04/54/0981 9 8 - 27 mg/dL Final  19/14/7829 15 mg/dL Final    Comment:    5-62 NOTE: New Reference Range  08/07/14    Potassium  Date Value Ref Range Status  07/19/2023 4.8  3.5 - 5.1 mmol/L Final  09/21/2014 3.5 mmol/L Final    Comment:    3.5-5.1 NOTE: New Reference Range  08/07/14    Sodium  Date Value Ref Range Status  07/19/2023 132 (L) 135 - 145 mmol/L Final  08/26/2021 140 134 - 144 mmol/L Final  09/21/2014 139 mmol/L Final    Comment:    135-145 NOTE: New Reference Range  08/07/14    B Natriuretic Peptide  Date Value Ref Range Status  07/17/2023 585.4 (H) 0.0 - 100.0 pg/mL Final    Comment:    Performed at [facility], [address] [place], [place] [address]   Magnesium  Date Value Ref Range Status  07/19/2023 2.1 1.7 - 2.4 mg/dL Final    Comment:    Performed at [facility], [address] [place], [place] [address]  09/21/2014 1.8 mg/dL Final    Comment:    9.6-2.9 THERAPEUTIC RANGE: 4-7 mg/dL TOXIC: > 10 mg/dL  ----------------------- NOTE: New Reference Range  08/07/14    Hemoglobin A1C  Date Value Ref Range Status  03/28/2014 6.4 (H) 4.2 - 6.3 % Final    Comment:    The American Diabetes Association recommends that a primary goal of therapy should be <7% and that physicians should reevaluate the treatment regimen in patients with HbA1c values consistently >8%.    HB A1C (BAYER DCA - WAIVED)  Date Value Ref Range Status  07/30/2016 6.1 <7.0 % Final    Comment:  Diabetic Adult            <7.0                                       Healthy Adult        4.3 - 5.7                                                           (DCCT/NGSP) American Diabetes Association's Summary of Glycemic Recommendations for Adults with Diabetes: Hemoglobin A1c <7.0%. More stringent glycemic goals (A1c <6.0%) may further reduce complications at the cost of increased risk of hypoglycemia.    Hgb A1c MFr Bld  Date Value Ref Range Status  03/22/2023 5.5 4.8 - 5.6 % Final    Comment:    (NOTE) Pre diabetes:          5.7%-6.4%  Diabetes:              >6.4%  Glycemic  control for   <7.0% adults with diabetes    Digoxin Level  Date Value Ref Range Status  07/15/2023 0.6 (L) 0.8 - 2.0 ng/mL Final    Comment:    Performed at [facility], [address] [place], [place] [address]   TSH  Date Value Ref Range Status  07/10/2023 3.879 0.350 - 4.500 uIU/mL Final    Comment:    Performed by a 3rd Generation assay with a functional sensitivity of <=0.01 uIU/mL. Performed at [facility], [address] [place], [place] [address]   08/26/2021 1.600 0.450 - 4.500 uIU/mL Final    Vital Signs: Admission weight: 184.7 lbs Temp:  [97.5 F (36.4 C)-99.4 F (37.4 C)] (P) 98.6 F (37 C) (02/18 0800) Pulse Rate:  [81-135] (P) 84 (02/18 0800) Cardiac Rhythm: Atrial flutter (02/18 0700) Resp:  [18-27] 18 (02/18 0504) BP: (97-122)/(61-94) (P) 107/68 (02/18 0800) SpO2:  [94 %-100 %] 100 % (02/18 0504) Weight:  [63.4 kg (139 lb 12.4 oz)] 63.4 kg (139 lb 12.4 oz) (02/18 0500)  Intake/Output Summary (Last 24 hours) at 07/20/2023 1023 Last data filed at 07/19/2023 2355 Gross per 24 hour  Intake 358 ml  Output 1025 ml  Net -667 ml    Current Heart Failure Medications:  Loop diuretic: Furosemide 40 mg PO BID Beta-Blocker: Metoprolol tartrate 25 mg every 6 hours  ACEI/ARB/ARNI: none MRA: spironolactone 12.5 mg daily SGLT2i: Farxiga 10 mg daily Other: Digoxin 0.0625 mg daily   Prior to admission Heart Failure Medications:  Patient reported filling medications at [facility], however, after speaking with them on the phone, there have been no prescriptions filled in the last year. After discussing this with the patient he admits to being off medications for a very long time.  Assessment: 1. Acute on chronic systolic heart failure (LVEF 35-40%), due to predominant NICM (high AF burden). NYHA class II symptoms.  -Symptoms: Significant improvement in shortness of breath and orthopnea. LEE resolved. No complaints  today. -Volume: Appears euvolemic on exam. Creatinine and BUN not measured today. Weight is down ~45 lbs from admission and stable on oral diuretics. LEE appears resolved. Continue furosemide 40 mg PO BID.  -Hemodynamics: BP is low but stable and HR remains  80-100s >On amiodarone for rhythm control. If remains in AFL after amiodarone loading, cardiology to consider cardioversion.  -BB: Metoprolol changed to tartrate and increased yesterday. Rates improved some today.. -ACEI/ARB/ARNI: Sherryll Burger was stopped for soft BP. Losartan was started without issues, but discontinued to allow BB titration given high rates. -MRA: On low dose spironolactone given BP. Can consider increasing prior to discharge pending BP and BMP.  -SGLT2i: Continue Farxiga 10 mg daily.  Plan: 1) Medication changes recommended at this time: -None  2) Patient assistance: -Copay for Clifton Custard, Jardiance, Eliquis, and Xarelto are $0 -Patient would like to receive medications via [place] mail order pharmacy in the future for convenience.  3) Education: - Patient has been educated on current HF medications and potential additions to HF medication regimen - Patient verbalizes understanding that over the next few months, these medication doses may change and more medications may be added to optimize HF regimen - Patient has been educated on basic disease state pathophysiology and goals of therapy  Medication Assistance / Insurance Benefits Check: Does the patient have prescription insurance?    Type of insurance plan:  Does the patient qualify for medication assistance through manufacturers or grants? Pending  Eligible grants and/or patient assistance programs: Pending  Medication assistance applications in progress: Pending  Medication assistance applications approved: Pending Approved medication assistance renewals will be completed by: Pending  Outpatient Pharmacy: Prior to admission outpatient pharmacy:  Not filling medications PTA Is the patient agreeable to switch to [facility]?: Yes Is the patient willing to utilize a [facility] pharmacy at discharge?: Yes  Please do not hesitate to reach out with questions or concerns,  Enos Fling, PharmD, CPP, BCPS Heart Failure Pharmacist  Phone - [phone] 07/20/2023 10:23 AM

## 2023-07-20 NOTE — Plan of Care (Signed)
  Problem: Education: Goal: Knowledge of General Education information will improve Description: Including pain rating scale, medication(s)/side effects and non-pharmacologic comfort measures Outcome: Progressing   Problem: Nutrition: Goal: Adequate nutrition will be maintained Outcome: Progressing   Problem: Safety: Goal: Ability to remain free from injury will improve Outcome: Progressing   Problem: Health Behavior/Discharge Planning: Goal: Ability to manage health-related needs will improve Outcome: Not Progressing   Problem: Clinical Measurements: Goal: Cardiovascular complication will be avoided Outcome: Not Progressing   Problem: Coping: Goal: Level of anxiety will decrease Outcome: Not Progressing   Problem: Skin Integrity: Goal: Risk for impaired skin integrity will decrease Outcome: Not Progressing

## 2023-07-20 NOTE — Plan of Care (Signed)

## 2023-07-20 NOTE — Progress Notes (Signed)
Hylenex given left arm elevated on pillow, see related provider notifications and flowsheet for further details.

## 2023-07-20 NOTE — Progress Notes (Signed)
Progress Note  Patient Name: Darren Rogers Date of Encounter: 07/20/2023  Primary Cardiologist: Kirke Corin  Subjective   Developed atrial flutter with RVR with 2:1 AV block into the 140s bpm in the evening of 2/16, was placed on IV amiodarone at that time. Refused medications on 2/17 (taking only his PM dose of Eliquis - missed the morning dose). Agreed to take medications this morning. Remains in atrial flutter with variable AV block with rates in the 90s to low 100s bpm. No chest pain, dyspnea, palpitations, dizziness, presyncope, or syncope.   Inpatient Medications    Scheduled Meds:  apixaban  5 mg Oral BID   atorvastatin  40 mg Oral Daily   dapagliflozin propanediol  10 mg Oral Daily   digoxin  0.0625 mg Oral Daily   folic acid  1 mg Oral Daily   furosemide  40 mg Oral BID   metoprolol tartrate  25 mg Oral Q6H   multivitamin with minerals  1 tablet Oral Daily   nicotine  21 mg Transdermal Daily   pantoprazole  40 mg Oral Daily   QUEtiapine  25 mg Oral QHS   sodium chloride flush  3 mL Intravenous Q12H   spironolactone  12.5 mg Oral Daily   thiamine  100 mg Oral Daily   Or   thiamine  100 mg Intravenous Daily   Continuous Infusions:  amiodarone 30 mg/hr (07/20/23 0601)   PRN Meds: acetaminophen, methocarbamol, nitroGLYCERIN, ondansetron (ZOFRAN) IV, mouth rinse, polyethylene glycol, sodium chloride flush   Vital Signs    Vitals:   07/19/23 2355 07/20/23 0500 07/20/23 0504 07/20/23 0800  BP: 122/76  97/61 (P) 107/68  Pulse: (!) 101  81 (P) 84  Resp: 19  18   Temp: 98.2 F (36.8 C)  97.6 F (36.4 C) (P) 98.6 F (37 C)  TempSrc: Oral   (P) Oral  SpO2: 99%  100%   Weight:  63.4 kg    Height:        Intake/Output Summary (Last 24 hours) at 07/20/2023 1110 Last data filed at 07/19/2023 2355 Gross per 24 hour  Intake 358 ml  Output 1025 ml  Net -667 ml   Filed Weights   07/18/23 0500 07/19/23 0451 07/20/23 0500  Weight: 63.4 kg 63.7 kg 63.4 kg    Telemetry     Atrial flutter with variable AV block with rates in the 90s to low 100s bpm - Personally Reviewed  ECG    No new tracings  - Personally Reviewed  Physical Exam   GEN: No acute distress.   Neck: No JVD. Cardiac: IRIR, no murmurs, rubs, or gallops.  Respiratory: Diminished breath sounds bilaterally with poor inspiratory effort.   GI: Soft, nontender, non-distended.   MS: No edema; No deformity. Neuro:  Alert and oriented x 3; Nonfocal.  Psych: Normal affect.  Labs    Chemistry Recent Labs  Lab 07/15/23 [phone] 07/16/23 0645 07/17/23 0436 07/18/23 0315 07/19/23 0337  NA 132*   < > 131* 131* 132*  K 3.6   < > 3.3* 3.3* 4.8  CL 93*   < > 96* 93* 95*  CO2 30   < > 24 25 23   GLUCOSE 104*   < > 151* 130* 85  BUN 15   < > 14 17 17   CREATININE 1.03   < > 0.90 0.85 0.78  CALCIUM 8.8*   < > 9.4 9.5 9.5  ALBUMIN 3.0*  --   --   --   --  GFRNONAA >60   < > >60 >60 >60  ANIONGAP 9   < > 11 13 14    < > = values in this interval not displayed.     Hematology Recent Labs  Lab 07/17/23 0436 07/18/23 0315 07/19/23 0337  WBC 4.0 4.0 5.2  RBC 3.81* 3.91* 4.92  HGB 12.3* 12.5* 15.8  HCT 34.0* 35.9* 44.9  MCV 89.2 91.8 91.3  MCH 32.3 32.0 32.1  MCHC 36.2* 34.8 35.2  RDW 16.4* 16.3* 16.6*  PLT 251 280 308    Cardiac EnzymesNo results for input(s): "TROPONINI" in the last 168 hours. No results for input(s): "TROPIPOC" in the last 168 hours.   BNP Recent Labs  Lab 07/17/23 0436  BNP 585.4*     DDimer No results for input(s): "DDIMER" in the last 168 hours.   Radiology    No results found.  Cardiac Studies   2D echo 07/13/2023: 1. Left ventricular ejection fraction, by estimation, is 30 to 35%. The  left ventricle has moderately decreased function. The left ventricle  demonstrates global hypokinesis. Left ventricular diastolic parameters are  indeterminate.   2. Right ventricular systolic function is moderately reduced. The right  ventricular size is mildly  enlarged. There is mildly elevated pulmonary  artery systolic pressure.   3. Right atrial size was moderately dilated.   4. The mitral valve is normal in structure. Moderate mitral valve  regurgitation. No evidence of mitral stenosis.   5. The tricuspid valve is degenerative. Tricuspid valve regurgitation is  severe.   6. The aortic valve is tricuspid. Aortic valve regurgitation is mild. No  aortic stenosis is present.   7. The inferior vena cava is dilated in size with <50% respiratory  variability, suggesting right atrial pressure of 15 mmHg.   Patient Profile     68 y.o. male with history of medication noncompliance, coronary artery disease, atrial fibrillation on apixaban, hypertension, hyperlipidemia, chronic HFrEF, and an LVEF of 35-40%, frequent PVCs, alcohol abuse, recurrent alcoholic pancreatitis, who is being seen for further exacerbation of CHF and Afib/flutter with RVR.  Assessment & Plan    1. Atrial flutter with RVR with PAF: -Long history of Afib with medication noncompliance -At time of EMS arrival, noted to be in Afib with RVR and lethargic with DCCV -Was maintaining sinus rhythm -Developed atrial flutter with 2:1 AV block in the evening of 2/16, was placed on amiodarone gtt -We recommended his metoprolol be transitioned to Lopressor for more frequent dose and added rate control -Refused morning medications on 2/17, in this setting he did not receive his morning dose of Eliquis -He did agree to take PM doses of medications on 2/17 -Remains in atrial flutter with variable AV block with improved rates in the 90s to low 100s bpm -Continue IV amiodarone for rate control (limited options) -Continue Lopressor 25 mg every 6 hours for added rate control -Remains on digoxin 0.0625 mg -Ultimately, may need TEE-guided DCCV if we continue to have difficulty in controlling his rates (he missed a dose of Eliquis secondary to patient refusal of medications); however there are concerns  regarding ongoing compliance with medications -Recent TSH normal -Hypokalemia repleted -Magnesium at goal  2. 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 and medical noncompliance: -History of cardiomyopathy since 11/2022 echocardiogram at that time revealed LVEF of 35-40% with a history of noncompliance and alcoholism -Echocardiogram 07/13/23 revealed LVEF of 30-35%, LV global hypokinesis, mildly elevated PA systolic pressures, mildly dilated RV, moderate MR, severe TR -Documented  UOP 21 L for the admission -Serum creatinine has remained stable -Refused IV lasix on 2/16, changed to oral Lasix 40 mg twice daily -Now on Farxiga 10 mg daily -Losartan was held on 2/17 for added BP room for escalation of beta blocker for added rate control -Continued on spironolactone 12.5 mg daily and digoxin 0.0625 mg daily -Transition from Toprol XL to Lopressor as above for added rate control -Prior to discharge, consolidate back to Toprol given cardiomyopathy -Continue to escalate GDMT as tolerated by blood pressure and kidney function -Heart failure education -Daily weights, I's and O's, low-sodium diet -Overall poor prognosis   3. CAD with stabe angina -Left heart catheterization completed 11/2022 showed severe single-vessel CAD with good collaterals and no targets for revascularization -Continues to deny chest pain -High-sensitivity troponin flat trending and peaking at 19, likely secondary to demand ischemia in the setting of atrial flutter with RVR -Continued on atorvastatin 40 mg daily -Continued on apixaban 5 mg twice daily and lieu of aspirin -EKG as needed for pain or changes   Hypokalemia -Repleted   NSVT -No further episodes -Pharmacotherapy as above -Maintain potassium and magnesium at goal 4.0 and 2.0, respectively    Weakness -Recommend he work with physical therapy order placed this morning -Overall poor nutrition and muscle atrophy in his legs    Alcohol abuse, 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    Signed, Eula Listen, PA-C [facility] Pager: [phone] 07/20/2023, 11:10 AM

## 2023-07-20 NOTE — Progress Notes (Signed)
PROGRESS NOTE    Darren Rogers  FAO:[id number] DOB: 07-Aug-1955 DOA: 07/10/2023 PCP: Pcp, No  [id number]/[id number]  LOS: 9 days   Brief hospital course:   Assessment & Plan: Darren Rogers is a 67 y.o. male with medical history significant of medication noncompliance, CAD, chronic A-fib on Eliquis and digoxin, HTN, HLD, chronic HFrEF with LVEF 35-40%, frequent PVCs, alcohol abuse and recurrent alcoholic pancreatitis presented with increasing shortness of breath, peripheral edema feeling malaise.  Patient drinks 48 ounces of beer twice a day, and not reported taking his medication occasionally.  on EMS arrival, pt found to have HR 190s-200s. Patient also lethargic for EMS.  Due to inability to obtain access, pt given IM versed and shocked by EMS at 200J.  Upon arriving to hospital, patient was found to have anasarca, chest x-ray showed cardiomegaly with pulmonary edema, significant elevated BNP.  Patient was started on diuretics for CHF exacerbation.   Acute respiratory failure with hypoxemia 2/2 Pulm edema Upon arriving the emergency room, patient had severe respiratory distress, oxygen saturation was as low as 78%, he was placed on a breather.  chest x-ray showed cardiomegaly with pulmonary edema.   --Pt has been weaned off supplemental oxygen after receiving diuresis.   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 Noncompliant with medical treatment. --presented with massive fluid overload.  current LVEF 30-35%.   --started on Lasix gtt, transitioned to bolus IV lasix on 2/13, and to oral lasix 40 BID on 2/16 Plan: --cont oral lasix 40 BID --cont Aldactone (Darren) --increase metoprolol to tartrate to 37.5 mg every 6 hours  --cont Farxiga  Paroxysmal Afib/Aflutter with RVR. --converted to sinus rhythm on presentation after he underwent cardioversion by EMS.  Went back to Darren Rogers w RVR evening of 2/16,  started on amiodarone bolus+gtt Plan: --cont amio gtt --cont digoxin and increased Lopressor --cont Eliquis --NPO MN for consideration of TEE-guided cardioversion, per cardio   Hypokalemia. hypophos --monitor and supplement PRN --pt prefers potassium powder to big pills   Hyponatremia with hypervolemia. 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 Lactic acidosis secondary to liver failure. Alcohol dependence. --Abdominal distention/ascites improving  --cont lasix and aldactone --alcohol cessation recommended   Single-vessel coronary disease. --cont statin --on Eliquis in place of ASA   Nonsustained ventricular tachycardia. Patient had 13 beats of V. tach 2/10.  This is in the setting of acute congestive heart failure and electrolytes abnormality.   --cont increased Lopressor --cont amio gtt  Bilateral calf pain --unlikely to be DVT since pt has been on Eliquis.  Likely due to significant fluid shifts and lower potassium level. --ensure electrolyte supplement   DVT prophylaxis: ON:GEXBMWU Code Status: Full code  Family Communication:  Level of care: Telemetry Cardiac Dispo:   The patient is from: home Anticipated d/c is to: home Anticipated d/c date is: 2-3 days, pending cardio clearance   Subjective and Interval History:  Pt reported he is too weak to go home, however, has reportedly declined mobility more than once.  Pt asked to go to SNF rehab, however, currently no rec for SNF rehab.  Did take his meds today.   Objective: Vitals:   07/20/23 0500 07/20/23 0504 07/20/23 0800 07/20/23 0801  BP:  97/61 107/68 107/68  Pulse:  81 84 84  Resp:  18  20  Temp:  97.6 F (36.4 C) 98.6 F (37 C) 98.6 F (37 C)  TempSrc:   Oral Oral  SpO2:  100%  97%  Weight: 63.4 kg     Height:        Intake/Output Summary (Last 24 hours) at 07/20/2023 1926 Last data filed at 07/20/2023 1200 Gross per 24 hour  Intake 720 ml  Output 400 ml  Net 320  ml   Filed Weights   07/18/23 0500 07/19/23 0451 07/20/23 0500  Weight: 63.4 kg 63.7 kg 63.4 kg    Examination:   Constitutional: NAD, AAOx3 HEENT: conjunctivae and lids normal, EOMI CV: No cyanosis.   RESP: normal respiratory effort, on RA Neuro: II - XII grossly intact.     Data Reviewed: I have personally reviewed labs and imaging studies  Time spent: 35 minutes  Darlin Priestly, MD [facility] If 7PM-7AM, please contact night-coverage 07/20/2023, 7:26 PM

## 2023-07-21 ENCOUNTER — Inpatient Hospital Stay: Payer: 59

## 2023-07-21 DIAGNOSIS — I4891 Unspecified atrial fibrillation: Secondary | ICD-10-CM | POA: Diagnosis not present

## 2023-07-21 DIAGNOSIS — I420 Dilated cardiomyopathy: Secondary | ICD-10-CM | POA: Diagnosis not present

## 2023-07-21 DIAGNOSIS — K7031 Alcoholic cirrhosis of liver with ascites: Secondary | ICD-10-CM | POA: Diagnosis not present

## 2023-07-21 DIAGNOSIS — R601 Generalized edema: Secondary | ICD-10-CM | POA: Diagnosis not present

## 2023-07-21 DIAGNOSIS — J9601 Acute respiratory failure with hypoxia: Secondary | ICD-10-CM | POA: Diagnosis not present

## 2023-07-21 DIAGNOSIS — I5043 Acute on chronic combined systolic (congestive) and diastolic (congestive) heart failure: Secondary | ICD-10-CM | POA: Diagnosis not present

## 2023-07-21 DIAGNOSIS — Z91148 Patient's other noncompliance with medication regimen for other reason: Secondary | ICD-10-CM

## 2023-07-21 LAB — BASIC METABOLIC PANEL
Anion gap: 9 (ref 5–15)
BUN: 18 mg/dL (ref 8–23)
CO2: 24 mmol/L (ref 22–32)
Calcium: 9.3 mg/dL (ref 8.9–10.3)
Chloride: 103 mmol/L (ref 98–111)
Creatinine, Ser: 1.12 mg/dL (ref 0.61–1.24)
GFR, Estimated: >60 mL/min (ref >60)
Glucose, Bld: 98 mg/dL (ref 70–99)
Potassium: 4.5 mmol/L (ref 3.5–5.1)
Sodium: 136 mmol/L (ref 135–145)

## 2023-07-21 LAB — CBC
HCT: 36.2 % — ABNORMAL LOW (ref 39.0–52.0)
Hemoglobin: 12.4 g/dL — ABNORMAL LOW (ref 13.0–17.0)
MCH: 31.8 pg (ref 26.0–34.0)
MCHC: 34.3 g/dL (ref 30.0–36.0)
MCV: 92.8 fL (ref 80.0–100.0)
Platelets: 290 10*3/uL (ref 150–400)
RBC: 3.9 MIL/uL — ABNORMAL LOW (ref 4.22–5.81)
RDW: 16.8 % — ABNORMAL HIGH (ref 11.5–15.5)
WBC: 5.8 10*3/uL (ref 4.0–10.5)
nRBC: 0 % (ref 0.0–0.2)

## 2023-07-21 LAB — MAGNESIUM: Magnesium: 2 mg/dL (ref 1.7–2.4)

## 2023-07-21 MED ORDER — METOPROLOL SUCCINATE ER 25 MG PO TB24
25.0000 mg | ORAL_TABLET | Freq: Two times a day (BID) | ORAL | Status: DC
  Administered 2023-07-21 – 2023-07-23 (×4): 25 mg via ORAL
  Filled 2023-07-21 (×4): qty 1

## 2023-07-21 MED ORDER — AMIODARONE HCL 200 MG PO TABS
200.0000 mg | ORAL_TABLET | Freq: Two times a day (BID) | ORAL | Status: DC
Start: 2023-07-28 — End: 2023-08-04

## 2023-07-21 MED ORDER — AMIODARONE HCL 200 MG PO TABS
400.0000 mg | ORAL_TABLET | Freq: Two times a day (BID) | ORAL | Status: DC
  Administered 2023-07-21 – 2023-07-23 (×5): 400 mg via ORAL
  Filled 2023-07-21 (×5): qty 2

## 2023-07-21 MED ORDER — AMIODARONE HCL 200 MG PO TABS
200.0000 mg | ORAL_TABLET | Freq: Every day | ORAL | Status: DC
Start: 2023-08-04 — End: 2023-07-23

## 2023-07-21 NOTE — Progress Notes (Signed)
Progress Note  Patient Name: Darren Rogers Date of Encounter: 07/21/2023  Primary Cardiologist: Kirke Corin  Subjective   Developed atrial flutter with RVR with 2:1 AV block into the 140s bpm in the evening of 2/16, was placed on IV amiodarone at that time. Toprol was transitioned to Lopressor for added ventricular rate control on 2/17. He refused medications on 2/17 (taking only his PM dose of Eliquis - missed the morning dose). Converted to sinus rhythm at 20:06 on 2/18. Breathing a little better. No chest pain. Legs continue to feel weak; working with PT.   Inpatient Medications    Scheduled Meds:  amiodarone  400 mg Oral BID   Followed by   Melene Muller ON 07/28/2023] amiodarone  200 mg Oral BID   Followed by   Melene Muller ON 08/04/2023] amiodarone  200 mg Oral Daily   apixaban  5 mg Oral BID   atorvastatin  40 mg Oral Daily   dapagliflozin propanediol  10 mg Oral Daily   digoxin  0.0625 mg Oral Daily   folic acid  1 mg Oral Daily   furosemide  40 mg Oral BID   metoprolol succinate  25 mg Oral BID   multivitamin with minerals  1 tablet Oral Daily   nicotine  21 mg Transdermal Daily   pantoprazole  40 mg Oral Daily   QUEtiapine  25 mg Oral QHS   sodium chloride flush  3 mL Intravenous Q12H   spironolactone  12.5 mg Oral Daily   thiamine  100 mg Oral Daily   Or   thiamine  100 mg Intravenous Daily   Continuous Infusions:   PRN Meds: acetaminophen, methocarbamol, nitroGLYCERIN, ondansetron (ZOFRAN) IV, mouth rinse, polyethylene glycol, sodium chloride flush   Vital Signs    Vitals:   07/21/23 0030 07/21/23 0343 07/21/23 0457 07/21/23 0816  BP: 109/78 102/70  105/71  Pulse: 77 68  65  Resp: 18 20  17   Temp: 97.9 F (36.6 C) 97.6 F (36.4 C)  98.3 F (36.8 C)  TempSrc: Oral Oral  Oral  SpO2: 100% 100%  97%  Weight:   61.7 kg   Height:        Intake/Output Summary (Last 24 hours) at 07/21/2023 0926 Last data filed at 07/21/2023 0913 Gross per 24 hour  Intake 483 ml  Output  350 ml  Net 133 ml   Filed Weights   07/19/23 0451 07/20/23 0500 07/21/23 0457  Weight: 63.7 kg 63.4 kg 61.7 kg    Telemetry    Converted to sinus rhythm at 20:06 on 2/18, remains in sinus rhythm at this time - Personally Reviewed  ECG    No new tracings  - Personally Reviewed  Physical Exam   GEN: No acute distress.   Neck: No JVD. Cardiac: RRR, no murmurs, rubs, or gallops.  Respiratory: Diminished breath sounds bilaterally with poor inspiratory effort.   GI: Soft, nontender, non-distended.   MS: No edema; No deformity. Neuro:  Alert and oriented x 3; Nonfocal.  Psych: Normal affect.  Labs    Chemistry Recent Labs  Lab 07/15/23 [phone] 07/16/23 0645 07/18/23 0315 07/19/23 0337 07/21/23 0631  NA 132*   < > 131* 132* 136  K 3.6   < > 3.3* 4.8 4.5  CL 93*   < > 93* 95* 103  CO2 30   < > 25 23 24   GLUCOSE 104*   < > 130* 85 98  BUN 15   < > 17 17 18   CREATININE  1.03   < > 0.85 0.78 1.12  CALCIUM 8.8*   < > 9.5 9.5 9.3  ALBUMIN 3.0*  --   --   --   --   GFRNONAA >60   < > >60 >60 >60  ANIONGAP 9   < > 13 14 9    < > = values in this interval not displayed.     Hematology Recent Labs  Lab 07/18/23 0315 07/19/23 0337 07/21/23 0631  WBC 4.0 5.2 5.8  RBC 3.91* 4.92 3.90*  HGB 12.5* 15.8 12.4*  HCT 35.9* 44.9 36.2*  MCV 91.8 91.3 92.8  MCH 32.0 32.1 31.8  MCHC 34.8 35.2 34.3  RDW 16.3* 16.6* 16.8*  PLT 280 308 290    Cardiac EnzymesNo results for input(s): "TROPONINI" in the last 168 hours. No results for input(s): "TROPIPOC" in the last 168 hours.   BNP Recent Labs  Lab 07/17/23 0436  BNP 585.4*     DDimer No results for input(s): "DDIMER" in the last 168 hours.   Radiology    No results found.  Cardiac Studies   2D echo 07/13/2023: 1. Left ventricular ejection fraction, by estimation, is 30 to 35%. The  left ventricle has moderately decreased function. The left ventricle  demonstrates global hypokinesis. Left ventricular diastolic  parameters are  indeterminate.   2. Right ventricular systolic function is moderately reduced. The right  ventricular size is mildly enlarged. There is mildly elevated pulmonary  artery systolic pressure.   3. Right atrial size was moderately dilated.   4. The mitral valve is normal in structure. Moderate mitral valve  regurgitation. No evidence of mitral stenosis.   5. The tricuspid valve is degenerative. Tricuspid valve regurgitation is  severe.   6. The aortic valve is tricuspid. Aortic valve regurgitation is mild. No  aortic stenosis is present.   7. The inferior vena cava is dilated in size with <50% respiratory  variability, suggesting right atrial pressure of 15 mmHg.   Patient Profile     68 y.o. male with history of medication noncompliance, coronary artery disease, atrial fibrillation on apixaban, hypertension, hyperlipidemia, chronic HFrEF, and an LVEF of 35-40%, frequent PVCs, alcohol abuse, recurrent alcoholic pancreatitis, who is being seen for further exacerbation of CHF and Afib/flutter with RVR.  Assessment & Plan    1. Atrial flutter with RVR with PAF: -Long history of Afib with medication noncompliance -At time of EMS arrival, noted to be in Afib with RVR and lethargic with DCCV -Was maintaining sinus rhythm -Developed atrial flutter with 2:1 AV block in the evening of 2/16, was placed on amiodarone gtt -We recommended his metoprolol be transitioned to Lopressor for more frequent dose and added rate control -Refused morning medications on 2/17, in this setting he did not receive his morning dose of Eliquis -He did agree to take PM doses of medications on 2/17 -Converted to sinus rhythm at 20:06 on 2/18 -Transition from IV amiodarone to oral amiodarone 400 mg bid x 7 days, 200 mg bid x 7 days, then 200 mg daily thereafter -Consolidate Lopressor to Toprol XL 25 mg bid, starting this evening -Remains on digoxin 0.0625 mg, level 0.6 on 2/13 -Recent TSH  normal -Hypokalemia repleted -Magnesium at goal  2. 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 and medical noncompliance: -History of cardiomyopathy since 11/2022 echocardiogram at that time revealed LVEF of 35-40% with a history of noncompliance and alcoholism -Echocardiogram 07/13/23 revealed LVEF of 30-35%, LV global hypokinesis, mildly elevated PA systolic pressures,  mildly dilated RV, moderate MR, severe TR -Documented UOP 20.6 L for the admission -Serum creatinine has remained stable -Refused IV lasix on 2/16, changed to oral Lasix 40 mg twice daily -Now on Farxiga 10 mg daily -Losartan was held on 2/17 for added BP room for escalation of beta blocker for added rate control -Relative hypotension precludes resumption of ARB at this time -Continued on spironolactone 12.5 mg daily and digoxin 0.0625 mg daily -Transition from Lopressor to Toprol as above given he has converted to sinus rhythm, and in the context of his cardiomyopathy  -Continue to escalate GDMT as tolerated by blood pressure and kidney function -Heart failure education -Daily weights, I's and O's, low-sodium diet -Overall poor prognosis   3. CAD with stabe angina -Left heart catheterization completed 11/2022 showed severe single-vessel CAD with good collaterals and no targets for revascularization -Continues to deny chest pain -High-sensitivity troponin flat trending and peaking at 19, likely secondary to demand ischemia in the setting of atrial flutter with RVR -Continued on atorvastatin 40 mg daily -Continued on apixaban 5 mg twice daily and lieu of aspirin -EKG as needed for pain or changes   Hypokalemia -Repleted   NSVT -No further episodes -Pharmacotherapy as above -Maintain potassium and magnesium at goal 4.0 and 2.0, respectively    Weakness -Recommend he work with physical therapy order placed this morning -Overall poor nutrition and muscle atrophy in his legs   Alcohol  abuse, 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    Signed, Eula Listen, PA-C [facility] Pager: [phone] 07/21/2023, 9:26 AM

## 2023-07-21 NOTE — Plan of Care (Signed)

## 2023-07-21 NOTE — Progress Notes (Signed)
Pt c/o sob and that he can't breathe , pt is sating 100% on 2LNC.  Pt complains of SOB at rest. Vitals stable, lung sounds clear, diminished no change from baseline. Pt here for CHF exacerbation and a-flutter currently on amio gtt hr currently in the 80s denies any chest pain or fluttering. Provider on call notified, see new orders.    07/21/23 0030  Vitals  Temp 97.9 F (36.6 C)  Temp Source Oral  BP 109/78  MAP (mmHg) 89  BP Location Left Arm  BP Method Automatic  Patient Position (if appropriate) Lying  Pulse Rate 77  Pulse Rate Source Monitor  ECG Heart Rate 78  Resp 18  Level of Consciousness  Level of Consciousness Alert  MEWS COLOR  MEWS Score Color Green  Oxygen Therapy  SpO2 100 %  O2 Device Nasal Cannula  O2 Flow Rate (L/min) 2 L/min  MEWS Score  MEWS Temp 0  MEWS Systolic 0  MEWS Pulse 0  MEWS RR 0  MEWS LOC 0  MEWS Score 0

## 2023-07-21 NOTE — Progress Notes (Signed)
PROGRESS NOTE    Darren Rogers  ZOX:[id number] DOB: 03/23/1956 DOA: 07/10/2023 PCP: Pcp, No  [id number]  LOS: 10 days   Brief hospital course:  Darren Rogers is a 68 y.o. male with medical history significant of medication noncompliance, CAD, chronic A-fib on Eliquis and digoxin, HTN, HLD, chronic HFrEF with LVEF 35-40%, frequent PVCs, alcohol abuse and recurrent alcoholic pancreatitis presented with increasing shortness of breath, peripheral edema feeling malaise.  Patient drinks 48 ounces of beer twice a day, and not reported taking his medication occasionally.  on EMS arrival, pt found to have HR 190s-200s. Patient also lethargic for EMS.  Due to inability to obtain access, pt given IM versed and shocked by EMS at 200J.  Upon arriving to hospital, patient was found to have anasarca, chest x-ray showed cardiomegaly with pulmonary edema, significant elevated BNP.  Patient was started on diuretics for CHF exacerbation. 2/19: Patient spontaneously converted back to sinus rhythm, cardiology switched amiodarone infusion with p.o. amiodarone from tomorrow.  If remains stable can go home tomorrow.  Assessment & Plan:   Acute respiratory failure with hypoxemia 2/2 Pulm edema Upon arriving the emergency room, patient had severe respiratory distress, oxygen saturation was as low as 78%, he was placed on a breather.  chest x-ray showed cardiomegaly with pulmonary edema.   --Pt has been weaned off supplemental oxygen after receiving diuresis.   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 Noncompliant with medical treatment. --presented with massive fluid overload.  current LVEF 30-35%.   --started on Lasix gtt, transitioned to bolus IV lasix on 2/13, and to oral lasix 40 BID on 2/16 Plan: --cont oral lasix 40 BID --cont Aldactone (new) --Metoprolol 25 mg twice daily --cont Farxiga  Paroxysmal  Afib/Aflutter with RVR. --converted to sinus rhythm on presentation after he underwent cardioversion by EMS.  Went back to [employer] w RVR evening of 2/16, started on amiodarone bolus+gtt Spontaneously converted back to sinus rhythm today Plan: --Amiodarone infusion is being switched with p.o. by cardiology --cont digoxin and  Lopressor --cont Eliquis   Hypokalemia. hypophos --monitor and supplement PRN --pt prefers potassium powder to big pills   Hyponatremia with hypervolemia. 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 Lactic acidosis secondary to liver failure. Alcohol dependence. --Abdominal distention/ascites improving  --cont lasix and aldactone --alcohol cessation recommended   Single-vessel coronary disease. --cont statin --on Eliquis in place of ASA   Nonsustained ventricular tachycardia. Patient had 13 beats of V. tach 2/10.  This is in the setting of acute congestive heart failure and electrolytes abnormality.   --cont increased Lopressor --cont amio gtt  Bilateral calf pain --unlikely to be DVT since pt has been on Eliquis.  Likely due to significant fluid shifts and lower potassium level. --ensure electrolyte supplement   DVT prophylaxis: [id number] Code Status: Full code  Family Communication:  Level of care: Telemetry Cardiac Dispo:   The patient is from: home Anticipated d/c is to: home with home health Anticipated d/c date is: Likely tomorrow   Subjective and Interval History:  Patient was seen and examined today.  Denies any chest pain or shortness of breath.   Objective: Vitals:   07/21/23 0457 07/21/23 0816 07/21/23 1205 07/21/23 1608  BP:  105/71 104/60 106/74  Pulse:  65 (!) 59 73  Resp:  17 18 18   Temp:  98.3 F (36.8 C) 98.4 F (36.9 C) 98.1 F (36.7 C)  TempSrc:  Oral Oral  Oral  SpO2:  97% 99% 100%  Weight: 61.7 kg     Height:        Intake/Output Summary (Last 24 hours) at 07/21/2023 1729 Last data  filed at 07/21/2023 1300 Gross per 24 hour  Intake 239 ml  Output 1150 ml  Net -911 ml   Filed Weights   07/19/23 0451 07/20/23 0500 07/21/23 0457  Weight: 63.7 kg 63.4 kg 61.7 kg    Examination:   General.  Frail gentleman, in no acute distress. Pulmonary.  Lungs clear bilaterally, normal respiratory effort. CV.  Regular rate and rhythm, no JVD, rub or murmur. Abdomen.  Soft, nontender, nondistended, BS positive. CNS.  Alert and oriented .  No focal neurologic deficit. Extremities.  No edema, no cyanosis, pulses intact and symmetrical. Psychiatry.  Judgment and insight appears normal. .     Data Reviewed: I have personally reviewed labs and imaging studies  Time spent: 50 minutes  Arnetha Courser, MD [facility] If 7PM-7AM, please contact night-coverage 07/21/2023, 5:29 PM

## 2023-07-21 NOTE — Progress Notes (Signed)
Heart Failure Stewardship Pharmacy Note  PCP: Pcp, No PCP-Cardiologist: Lorine Bears, MD  HPI: Darren Rogers is a 68 y.o. male with former polysubstance use (cocaine, alcohol, tobacco), recurrent alcoholic pancreatitis, A fib on Eliquis and digoxin, HTN, HLD, depression, CAD, PAD with claudication, BPH, PVC who presented with heart racing, fall, and mild shortness of breath. Patient had been nonadherent to HF and AF medications including Eliquis prior to admission. On admission, BNP was 899.4, HS-troponin was 12, lactic acid was 2.6, digoxin level was <0.2, TSH was 3.879, ethanol was negative, and UDS was negative. Chest x-ray noted perihilar vascular congestion, mild lower zonal interstitial edema and small pleural effusions. Converted to NSR with amiodarone on the evening of 07/20/23.  Pertinent cardiac history: TTE in 05/2015 showed LEF 60-65%. Moderate blockage noted on arterial doppler in 06/2015. Stress test in 06/2015 showed T wave inversion in I, II, III, V4, V5, V6 during stress. A 2% PVC burden was noted 04/2017 via long term event monitor. TTE 07/2020 showed LVEF 50-55%. TTE 20/2022 showed LVEF 55-60% and low normal RV function. Event monitor 04/2021 showed 100% AF burden with rate of 99. TTE 11/2022 showed LVEF down to 35-40%, normal RV, mild-moderate MR, moderate TR, mild AR, and moderate PR. LHC 11/2022 showed severe single-vessel disease with CTO of Lcx/LPLA with left-to-left collaterals. During that time PVR was 5, PCWP was 18, and CI was 2.1.   Pertinent Lab Values: Creatinine  Date Value Ref Range Status  09/21/2014 1.10 mg/dL Final    Comment:    2.44-0.10 NOTE: New Reference Range  08/07/14    Creatinine, Ser  Date Value Ref Range Status  07/21/2023 1.12 0.61 - 1.24 mg/dL Final   BUN  Date Value Ref Range Status  07/21/2023 18 8 - 23 mg/dL Final  27/25/3664 9 8 - 27 mg/dL Final  40/34/7425 15 mg/dL Final    Comment:    9-56 NOTE: New Reference Range  08/07/14     Potassium  Date Value Ref Range Status  07/21/2023 4.5 3.5 - 5.1 mmol/L Final  09/21/2014 3.5 mmol/L Final    Comment:    3.5-5.1 NOTE: New Reference Range  08/07/14    Sodium  Date Value Ref Range Status  07/21/2023 136 135 - 145 mmol/L Final  08/26/2021 140 134 - 144 mmol/L Final  09/21/2014 139 mmol/L Final    Comment:    135-145 NOTE: New Reference Range  08/07/14    B Natriuretic Peptide  Date Value Ref Range Status  07/17/2023 585.4 (H) 0.0 - 100.0 pg/mL Final    Comment:    Performed at [facility], [address] [place], [place] [address]   Magnesium  Date Value Ref Range Status  07/21/2023 2.0 1.7 - 2.4 mg/dL Final    Comment:    Performed at [facility], [address] [place], [place] [address]  09/21/2014 1.8 mg/dL Final    Comment:    5.1-8.8 THERAPEUTIC RANGE: 4-7 mg/dL TOXIC: > 10 mg/dL  ----------------------- NOTE: New Reference Range  08/07/14    Hemoglobin A1C  Date Value Ref Range Status  03/28/2014 6.4 (H) 4.2 - 6.3 % Final    Comment:    The American Diabetes Association recommends that a primary goal of therapy should be <7% and that physicians should reevaluate the treatment regimen in patients with HbA1c values consistently >8%.    HB A1C (BAYER DCA - WAIVED)  Date Value Ref Range Status  07/30/2016 6.1 <7.0 % Final  Comment:                                          Diabetic Adult            <7.0                                       Healthy Adult        4.3 - 5.7                                                           (DCCT/NGSP) American Diabetes Association's Summary of Glycemic Recommendations for Adults with Diabetes: Hemoglobin A1c <7.0%. More stringent glycemic goals (A1c <6.0%) may further reduce complications at the cost of increased risk of hypoglycemia.    Hgb A1c MFr Bld  Date Value Ref Range Status  03/22/2023 5.5 4.8 - 5.6 % Final    Comment:    (NOTE) Pre diabetes:           5.7%-6.4%  Diabetes:              >6.4%  Glycemic control for   <7.0% adults with diabetes    Digoxin Level  Date Value Ref Range Status  07/15/2023 0.6 (L) 0.8 - 2.0 ng/mL Final    Comment:    Performed at [facility], [address] [place], [place] [address]   TSH  Date Value Ref Range Status  07/10/2023 3.879 0.350 - 4.500 uIU/mL Final    Comment:    Performed by a 3rd Generation assay with a functional sensitivity of <=0.01 uIU/mL. Performed at [facility], [address] [place], [place] [address]   08/26/2021 1.600 0.450 - 4.500 uIU/mL Final    Vital Signs: Admission weight: 184.7 lbs Temp:  [97.6 F (36.4 C)-98.3 F (36.8 C)] 98.3 F (36.8 C) (02/19 0816) Pulse Rate:  [65-80] 65 (02/19 0816) Cardiac Rhythm: Normal sinus rhythm;Bundle branch block (02/19 0812) Resp:  [17-20] 17 (02/19 0816) BP: (101-109)/(68-78) 105/71 (02/19 0816) SpO2:  [97 %-100 %] 97 % (02/19 0816) Weight:  [61.7 kg (136 lb 0.4 oz)] 61.7 kg (136 lb 0.4 oz) (02/19 0457)  Intake/Output Summary (Last 24 hours) at 07/21/2023 1052 Last data filed at 07/21/2023 0913 Gross per 24 hour  Intake 483 ml  Output 350 ml  Net 133 ml    Current Heart Failure Medications:  Loop diuretic: Furosemide 40 mg PO BID Beta-Blocker: Metoprolol succinate 25 mg BID ACEI/ARB/ARNI: none MRA: spironolactone 12.5 mg daily SGLT2i: Farxiga 10 mg daily Other: Digoxin 0.0625 mg daily   Prior to admission Heart Failure Medications:  Patient reported filling [facility] at [facility], however, after speaking with them on the phone, there have been no prescriptions filled in the last year. After discussing this with the patient he admits to being off medications for a very long time.  Assessment: 1. Acute on chronic systolic heart failure (LVEF 35-40%), due to predominant NICM (high AF burden). NYHA class II symptoms.  -Symptoms: Significant improvement in shortness of breath and  orthopnea. LEE resolved. No complaints today. -Volume: Appears euvolemic  on exam. Creatinine trending up slightly on furosemide 40 mg PO BID. Weight is down ~48 lbs from admission and down another 3 lbs from yesterday on oral diuretics. LEE appears resolved. If creatinine continues to trend up, furosemide may need to be decreased to daily. -Hemodynamics: BP is low but stable and HR remains 80-100s >On amiodarone for rhythm control.  -BB: Metoprolol changed to succinate 25 mg BID.  -ACEI/ARB/ARNI: Sherryll Burger was stopped for soft BP. Losartan was started without issues, but discontinued to allow BB titration given high rates. Can consider tomorrow if BP remains stable. -MRA: On low dose spironolactone given BP. Can consider increasing prior to discharge pending BP and BMP.  -SGLT2i: Continue Farxiga 10 mg daily.  Plan: 1) Medication changes recommended at this time: -None  2) Patient assistance: -Copay for Clifton Custard, Jardiance, Eliquis, and Xarelto are $0 -Patient would like to receive medications via  mail order pharmacy in the future for convenience.  3) Education: - Patient has been educated on current HF medications and potential additions to HF medication regimen - Patient verbalizes understanding that over the next few months, these medication doses may change and more medications may be added to optimize HF regimen - Patient has been educated on basic disease state pathophysiology and goals of therapy  Medication Assistance / Insurance Benefits Check: Does the patient have prescription insurance?    Type of insurance plan:  Does the patient qualify for medication assistance through manufacturers or grants? Pending  Eligible grants and/or patient assistance programs: Pending  Medication assistance applications in progress: Pending  Medication assistance applications approved: Pending Approved medication assistance renewals will be completed by:  Pending  Outpatient Pharmacy: Prior to admission outpatient pharmacy: Not filling medications PTA Is the patient agreeable to switch to [facility]patient [facility]?: Yes Is the patient willing to utilize a [facility] pharmacy at discharge?: Yes  Please do not hesitate to reach out with questions or concerns,  Enos Fling, PharmD, CPP, BCPS Heart Failure Pharmacist  Phone - [phone] 07/21/2023 10:52 AM

## 2023-07-21 NOTE — Progress Notes (Signed)
Physical Therapy Treatment Patient Details Name: Darren Rogers MRN: [id number] DOB: [birth year] Today's Date: 07/21/2023   History of Present Illness Darren Rogers is a 68 y.o. male with medical history significant of medication noncompliance, CAD, chronic A-fib on Eliquis and digoxin, HTN, HLD, chronic HFrEF with LVEF 35-40%, frequent PVCs, alcohol abuse and recurrent alcoholic pancreatitis presented with increasing shortness of breath, peripheral edema feeling malaise.    PT Comments  Pt is making good progress towards goals with ability to transfer bed->chair. Pt declines to use AD, however was very unsteady. Highly recommended use of RW and pt agreeable to use RW when returning back to bed after lunch. B LE very weak and pt generally unhappy about his situation. Will continue to progress.    If plan is discharge home, recommend the following: A little help with walking and/or transfers;A little help with bathing/dressing/bathroom;Assistance with cooking/housework;Direct supervision/assist for medications management;Assist for transportation;Help with stairs or ramp for entrance   Can travel by private vehicle        Equipment Recommendations  Rolling walker (2 wheels)    Recommendations for Other Services       Precautions / Restrictions Precautions Precautions: Fall Recall of Precautions/Restrictions: Intact Restrictions Weight Bearing Restrictions Per Provider Order: No     Mobility  Bed Mobility Overal bed mobility: Needs Assistance Bed Mobility: Supine to Sit     Supine to sit: Mod assist     General bed mobility comments: needs assist for B LE and trunkal elevation. Once seated at EOB, upright posture noted    Transfers Overall transfer level: Needs assistance Equipment used: 1 person hand held assist Transfers: Sit to/from Stand, Bed to chair/wheelchair/BSC   Stand pivot transfers: Min assist         General transfer comment: despite PT recs- pt declines to  use RW and wants to hold therapist hand. Able to pivot to recliner with slow and steady pace.    Ambulation/Gait               General Gait Details: unable to tolerate this date   Stairs             Wheelchair Mobility     Tilt Bed    Modified Rankin (Stroke Patients Only)       Balance Overall balance assessment: Needs assistance Sitting-balance support: Bilateral upper extremity supported, Feet supported Sitting balance-Leahy Scale: Good     Standing balance support: Single extremity supported Standing balance-Leahy Scale: Poor Standing balance comment: reaches out for chair railing on chair in addition to 1 hand held assist                            Communication Communication Communication: No apparent difficulties  Cognition Arousal: Alert Behavior During Therapy: WFL for tasks assessed/performed   PT - Cognitive impairments: No apparent impairments                       PT - Cognition Comments: grouchy and mood improved once he was allowed therapeudic listening Following commands: Intact      Cueing Cueing Techniques: Verbal cues  Exercises      General Comments        Pertinent Vitals/Pain Pain Assessment Pain Assessment: Faces Faces Pain Scale: Hurts a little bit Pain Location: Both calfs Pain Descriptors / Indicators: Aching Pain Intervention(s): Limited activity within patient's tolerance, Repositioned    Home Living  Prior Function            PT Goals (current goals can now be found in the care plan section) Acute Rehab PT Goals Patient Stated Goal: go home. PT Goal Formulation: With patient Time For Goal Achievement: 08/01/23 Potential to Achieve Goals: Good Progress towards PT goals: Progressing toward goals    Frequency    Min 1X/week      PT Plan      Co-evaluation              AM-PAC PT "6 Clicks" Mobility   Outcome Measure  Help needed  turning from your back to your side while in a flat bed without using bedrails?: A Little Help needed moving from lying on your back to sitting on the side of a flat bed without using bedrails?: A Little Help needed moving to and from a bed to a chair (including a wheelchair)?: A Little Help needed standing up from a chair using your arms (e.g., wheelchair or bedside chair)?: A Little Help needed to walk in hospital room?: A Lot Help needed climbing 3-5 steps with a railing? : Total 6 Click Score: 15    End of Session Equipment Utilized During Treatment: Gait belt Activity Tolerance: Patient limited by pain Patient left: in chair;with call bell/phone within reach;with chair alarm set Nurse Communication: Mobility status PT Visit Diagnosis: Unsteadiness on feet (R26.81);Difficulty in walking, not elsewhere classified (R26.2);Muscle weakness (generalized) (M62.81);History of falling (Z91.81);Pain     Time: 1610-9604 PT Time Calculation (min) (ACUTE ONLY): 23 min  Charges:    $Therapeutic Activity: 23-37 mins PT General Charges $$ ACUTE PT VISIT: 1 Visit                     Elizabeth Palau, PT, DPT, GCS [phone]    Sabella Traore 07/21/2023, 3:39 PM

## 2023-07-22 ENCOUNTER — Encounter: Payer: 59 | Admitting: Family

## 2023-07-22 DIAGNOSIS — R601 Generalized edema: Secondary | ICD-10-CM | POA: Diagnosis not present

## 2023-07-22 DIAGNOSIS — I4891 Unspecified atrial fibrillation: Secondary | ICD-10-CM | POA: Diagnosis not present

## 2023-07-22 DIAGNOSIS — K7031 Alcoholic cirrhosis of liver with ascites: Secondary | ICD-10-CM | POA: Diagnosis not present

## 2023-07-22 DIAGNOSIS — I420 Dilated cardiomyopathy: Secondary | ICD-10-CM | POA: Diagnosis not present

## 2023-07-22 DIAGNOSIS — J9601 Acute respiratory failure with hypoxia: Secondary | ICD-10-CM | POA: Diagnosis not present

## 2023-07-22 DIAGNOSIS — Z91148 Patient's other noncompliance with medication regimen for other reason: Secondary | ICD-10-CM | POA: Diagnosis not present

## 2023-07-22 LAB — BASIC METABOLIC PANEL
Anion gap: 10 (ref 5–15)
BUN: 20 mg/dL (ref 8–23)
CO2: 26 mmol/L (ref 22–32)
Calcium: 9.3 mg/dL (ref 8.9–10.3)
Chloride: 99 mmol/L (ref 98–111)
Creatinine, Ser: 1.01 mg/dL (ref 0.61–1.24)
GFR, Estimated: >60 mL/min (ref >60)
Glucose, Bld: 98 mg/dL (ref 70–99)
Potassium: 3.5 mmol/L (ref 3.5–5.1)
Sodium: 135 mmol/L (ref 135–145)

## 2023-07-22 MED ORDER — POTASSIUM CHLORIDE CRYS ER 20 MEQ PO TBCR
40.0000 meq | EXTENDED_RELEASE_TABLET | Freq: Once | ORAL | Status: AC
  Administered 2023-07-22: 40 meq via ORAL
  Filled 2023-07-22: qty 2

## 2023-07-22 NOTE — Plan of Care (Signed)
  Problem: Clinical Measurements: Goal: Respiratory complications will improve Outcome: Not Progressing Goal: Cardiovascular complication will be avoided Outcome: Progressing   Problem: Activity: Goal: Risk for activity intolerance will decrease Outcome: Progressing

## 2023-07-22 NOTE — Progress Notes (Signed)
PROGRESS NOTE    Darren Rogers  KVQ:[id number] DOB: 1956/02/28 DOA: 07/10/2023 PCP: Pcp, No  [id number]  LOS: 11 days   Brief hospital course:  Kyel Purk is a 68 y.o. male with medical history significant of medication noncompliance, CAD, chronic A-fib on Eliquis and digoxin, HTN, HLD, chronic HFrEF with LVEF 35-40%, frequent PVCs, alcohol abuse and recurrent alcoholic pancreatitis presented with increasing shortness of breath, peripheral edema feeling malaise.  Patient drinks 48 ounces of beer twice a day, and not reported taking his medication occasionally.  on EMS arrival, pt found to have HR 190s-200s. Patient also lethargic for EMS.  Due to inability to obtain access, pt given IM versed and shocked by EMS at 200J.  Upon arriving to hospital, patient was found to have anasarca, chest x-ray showed cardiomegaly with pulmonary edema, significant elevated BNP.  Patient was started on diuretics for CHF exacerbation. 2/19: Patient spontaneously converted back to sinus rhythm, cardiology switched amiodarone infusion with p.o. amiodarone from tomorrow.  If remains stable can go home tomorrow. 2/20: Remained in sinus rhythm, cardiology converted to amiodarone 400 mg twice daily followed by 200 mg twice daily starting from 07/28/2023.  Apparently there is no help at home today and he was requesting to go tomorrow morning. Home health orders were placed  Assessment & Plan:   Acute respiratory failure with hypoxemia 2/2 Pulm edema Upon arriving the emergency room, patient had severe respiratory distress, oxygen saturation was as low as 78%, he was placed on a breather.  chest x-ray showed cardiomegaly with pulmonary edema.   --Pt has been weaned off supplemental oxygen after receiving diuresis.   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 Noncompliant with medical treatment. --presented with massive  fluid overload.  current LVEF 30-35%.   --started on Lasix gtt, transitioned to bolus IV lasix on 2/13, and to oral lasix 40 BID on 2/16 Plan: --cont oral lasix 40 BID --cont Aldactone (new) --Metoprolol 25 mg twice daily --cont Farxiga  Paroxysmal Afib/Aflutter with RVR. --converted to sinus rhythm on presentation after he underwent cardioversion by EMS.  Went back to [employer] w RVR evening of 2/16, started on amiodarone bolus+gtt Spontaneously converted back to sinus rhythm today Plan: --Amiodarone infusion is being switched with p.o. by cardiology --Patient will take 400 mg twice daily until 2/26 when he will start 200 mg twice daily and they will taper as outpatient --cont digoxin and  Lopressor --cont Eliquis   Hypokalemia. hypophos --monitor and supplement PRN --pt prefers potassium powder to big pills   Hyponatremia with hypervolemia. 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 Lactic acidosis secondary to liver failure. Alcohol dependence. --Abdominal distention/ascites improving  --cont lasix and aldactone --alcohol cessation recommended   Single-vessel coronary disease. --cont statin --on Eliquis in place of ASA   Nonsustained ventricular tachycardia. Patient had 13 beats of V. tach 2/10.  This is in the setting of acute congestive heart failure and electrolytes abnormality.   --cont increased Lopressor --cont amio gtt  Bilateral calf pain --unlikely to be DVT since pt has been on Eliquis.  Likely due to significant fluid shifts and lower potassium level. --ensure electrolyte supplement   DVT prophylaxis: IE:PPIRJJO Code Status: Full code  Family Communication:  Level of care: Telemetry Cardiac Dispo:   The patient is from: home Anticipated d/c is to: home with home health Anticipated d/c date is: Likely tomorrow   Subjective and Interval History:  Patient  with no new concern when seen today.  He was requesting discharge  tomorrow morning as he will not have any help today at home.   Objective: Vitals:   07/22/23 0416 07/22/23 0526 07/22/23 0738 07/22/23 1238  BP: 114/69  111/72 111/61  Pulse: 64  65 63  Resp: 19  18   Temp: 98.4 F (36.9 C)  98.5 F (36.9 C) 97.8 F (36.6 C)  TempSrc: Oral   Oral  SpO2: 100%  100% 97%  Weight:  66.7 kg    Height:        Intake/Output Summary (Last 24 hours) at 07/22/2023 1553 Last data filed at 07/22/2023 1440 Gross per 24 hour  Intake 840 ml  Output 1025 ml  Net -185 ml   Filed Weights   07/20/23 0500 07/21/23 0457 07/22/23 0526  Weight: 63.4 kg 61.7 kg 66.7 kg    Examination:   General.  Frail gentleman, in no acute distress. Pulmonary.  Lungs clear bilaterally, normal respiratory effort. CV.  Regular rate and rhythm, no JVD, rub or murmur. Abdomen.  Soft, nontender, nondistended, BS positive. CNS.  Alert and oriented .  No focal neurologic deficit. Extremities.  No edema, no cyanosis, pulses intact and symmetrical. Psychiatry.  Judgment and insight appears normal.    Data Reviewed: I have personally reviewed labs and imaging studies  Time spent: 45 minutes  Arnetha Courser, MD [facility] If 7PM-7AM, please contact night-coverage 07/22/2023, 3:53 PM

## 2023-07-22 NOTE — Progress Notes (Addendum)
Heart Failure Stewardship Pharmacy Note  PCP: Pcp, No PCP-Cardiologist: Lorine Bears, MD  HPI: Darren Rogers is a 68 y.o. male with former polysubstance use (cocaine, alcohol, tobacco), recurrent alcoholic pancreatitis, A fib on Eliquis and digoxin, HTN, HLD, depression, CAD, PAD with claudication, BPH, PVC who presented with heart racing, fall, and mild shortness of breath. Patient had been nonadherent to HF and AF medications including Eliquis prior to admission. On admission, BNP was 899.4, HS-troponin was 12, lactic acid was 2.6, digoxin level was <0.2, TSH was 3.879, ethanol was negative, and UDS was negative. Chest x-ray noted perihilar vascular congestion, mild lower zonal interstitial edema and small pleural effusions. Converted to NSR with amiodarone on the evening of 07/20/23.  Pertinent cardiac history: TTE in 05/2015 showed LEF 60-65%. Moderate blockage noted on arterial doppler in 06/2015. Stress test in 06/2015 showed T wave inversion in I, II, III, V4, V5, V6 during stress. A 2% PVC burden was noted 04/2017 via long term event monitor. TTE 07/2020 showed LVEF 50-55%. TTE 20/2022 showed LVEF 55-60% and low normal RV function. Event monitor 04/2021 showed 100% AF burden with rate of 99. TTE 11/2022 showed LVEF down to 35-40%, normal RV, mild-moderate MR, moderate TR, mild AR, and moderate PR. LHC 11/2022 showed severe single-vessel disease with CTO of Lcx/LPLA with left-to-left collaterals. During that time PVR was 5, PCWP was 18, and CI was 2.1.   Pertinent Lab Values: Creatinine  Date Value Ref Range Status  09/21/2014 1.10 mg/dL Final    Comment:    4.54-0.98 NOTE: New Reference Range  08/07/14    Creatinine, Ser  Date Value Ref Range Status  07/22/2023 1.01 0.61 - 1.24 mg/dL Final   BUN  Date Value Ref Range Status  07/22/2023 20 8 - 23 mg/dL Final  11/91/4782 9 8 - 27 mg/dL Final  95/62/1308 15 mg/dL Final    Comment:    6-57 NOTE: New Reference Range  08/07/14     Potassium  Date Value Ref Range Status  07/22/2023 3.5 3.5 - 5.1 mmol/L Final  09/21/2014 3.5 mmol/L Final    Comment:    3.5-5.1 NOTE: New Reference Range  08/07/14    Sodium  Date Value Ref Range Status  07/22/2023 135 135 - 145 mmol/L Final  08/26/2021 140 134 - 144 mmol/L Final  09/21/2014 139 mmol/L Final    Comment:    135-145 NOTE: New Reference Range  08/07/14    B Natriuretic Peptide  Date Value Ref Range Status  07/17/2023 585.4 (H) 0.0 - 100.0 pg/mL Final    Comment:    Performed at [facility], [address] [place], [place] [address]   Magnesium  Date Value Ref Range Status  07/21/2023 2.0 1.7 - 2.4 mg/dL Final    Comment:    Performed at [facility], [address] [place], [place] [address]  09/21/2014 1.8 mg/dL Final    Comment:    4.1-3.2 THERAPEUTIC RANGE: 4-7 mg/dL TOXIC: > 10 mg/dL  ----------------------- NOTE: New Reference Range  08/07/14    Hemoglobin A1C  Date Value Ref Range Status  03/28/2014 6.4 (H) 4.2 - 6.3 % Final    Comment:    The American Diabetes Association recommends that a primary goal of therapy should be <7% and that physicians should reevaluate the treatment regimen in patients with HbA1c values consistently >8%.    HB A1C (BAYER DCA - WAIVED)  Date Value Ref Range Status  07/30/2016 6.1 <7.0 % Final  Comment:                                          Diabetic Adult            <7.0                                       Healthy Adult        4.3 - 5.7                                                           (DCCT/NGSP) American Diabetes Association's Summary of Glycemic Recommendations for Adults with Diabetes: Hemoglobin A1c <7.0%. More stringent glycemic goals (A1c <6.0%) may further reduce complications at the cost of increased risk of hypoglycemia.    Hgb A1c MFr Bld  Date Value Ref Range Status  03/22/2023 5.5 4.8 - 5.6 % Final    Comment:    (NOTE) Pre diabetes:           5.7%-6.4%  Diabetes:              >6.4%  Glycemic control for   <7.0% adults with diabetes    Digoxin Level  Date Value Ref Range Status  07/15/2023 0.6 (L) 0.8 - 2.0 ng/mL Final    Comment:    Performed at [facility], [address] [place], [place] [address]   TSH  Date Value Ref Range Status  07/10/2023 3.879 0.350 - 4.500 uIU/mL Final    Comment:    Performed by a 3rd Generation assay with a functional sensitivity of <=0.01 uIU/mL. Performed at [facility], [address] [place], [place] [address]   08/26/2021 1.600 0.450 - 4.500 uIU/mL Final    Vital Signs: Admission weight: 184.7 lbs Temp:  [97.8 F (36.6 C)-99 F (37.2 C)] 98.5 F (36.9 C) (02/20 0738) Pulse Rate:  [59-73] 65 (02/20 0738) Cardiac Rhythm: Normal sinus rhythm (02/20 0817) Resp:  [18-19] 18 (02/20 0738) BP: (104-117)/(60-78) 111/72 (02/20 0738) SpO2:  [99 %-100 %] 100 % (02/20 0738) Weight:  [66.7 kg (147 lb 0.8 oz)] 66.7 kg (147 lb 0.8 oz) (02/20 0526)  Intake/Output Summary (Last 24 hours) at 07/22/2023 4401 Last data filed at 07/22/2023 0500 Gross per 24 hour  Intake 839 ml  Output 1100 ml  Net -261 ml    Current Heart Failure Medications:  Loop diuretic: Furosemide 40 mg PO BID Beta-Blocker: Metoprolol succinate 25 mg BID ACEI/ARB/ARNI: none MRA: spironolactone 12.5 mg daily SGLT2i: Farxiga 10 mg daily Other: Digoxin 0.0625 mg daily   Prior to admission Heart Failure Medications:  Patient reported filling medications at [facility], however, after speaking with them on the phone, there have been no prescriptions filled in the last year. After discussing this with the patient he admits to being off medications for a very long time.  Assessment: 1. Acute on chronic systolic heart failure (LVEF 35-40%), due to predominant NICM (high AF burden). NYHA class II symptoms.  -Symptoms: Significant improvement in shortness of breath and orthopnea. LEE  resolved. No complaints today. Felt good while ambulating yesterday. -  Volume: Appears euvolemic on exam. Creatinine is stable on furosemide 40 mg PO BID. Weight is charted significantly up today, but doubt accuracy. Would benefit from standing weights from now on.  -Hemodynamics: BP is normal and HR remains 60s >On amiodarone for rhythm control.  -BB: Metoprolol succinate 25 mg BID.  -ACEI/ARB/ARNI: Sherryll Burger was stopped for soft BP. Losartan was started without issues, but discontinued to allow BB titration given high rates. Can consider restarting losartan 12.5 mg daily. -MRA: On low dose spironolactone given BP. Can consider increasing prior to discharge pending BP and BMP.  -SGLT2i: Continue Farxiga 10 mg daily.  Plan: 1) Medication changes recommended at this time: -Consider starting losartan 12.5 mg daily -Consider repeating digoxin level tomorrow  2) Patient assistance: -Copay for Sherryll Burger, Farxiga, Jardiance, Eliquis, and Xarelto are $0 -Patient would like to receive medications via [place] mail order pharmacy in the future for convenience.  3) Education: - Patient has been educated on current HF medications and potential additions to HF medication regimen - Patient verbalizes understanding that over the next few months, these medication doses may change and more medications may be added to optimize HF regimen - Patient has been educated on basic disease state pathophysiology and goals of therapy  Medication Assistance / Insurance Benefits Check: Does the patient have prescription insurance?    Type of insurance plan:  Does the patient qualify for medication assistance through manufacturers or grants? Pending  Eligible grants and/or patient assistance programs: Pending  Medication assistance applications in progress: Pending  Medication assistance applications approved: Pending Approved medication assistance renewals will be completed by: Pending  Outpatient  Pharmacy: Prior to admission outpatient pharmacy: Not filling medications PTA Is the patient agreeable to switch to [facility]?: Yes Is the patient willing to utilize a [facility] pharmacy at discharge?: Yes  Please do not hesitate to reach out with questions or concerns,  Enos Fling, PharmD, CPP, BCPS Heart Failure Pharmacist  Phone - [phone] 07/22/2023 8:21 AM

## 2023-07-22 NOTE — Progress Notes (Signed)
Rounding Note    Patient Name: Darren Rogers Date of Encounter: 07/22/2023  [facility] Cardiologist: Lorine Bears, MD   Subjective   Patient reports feeling well today. He does not feel ready to discharge at this time due to leg weakness. He has transitioned to oral medications and remains in sinus rhythm.   Inpatient Medications    Scheduled Meds:  amiodarone  400 mg Oral BID   Followed by   Melene Muller ON 07/28/2023] amiodarone  200 mg Oral BID   Followed by   Melene Muller ON 08/04/2023] amiodarone  200 mg Oral Daily   apixaban  5 mg Oral BID   atorvastatin  40 mg Oral Daily   dapagliflozin propanediol  10 mg Oral Daily   digoxin  0.0625 mg Oral Daily   folic acid  1 mg Oral Daily   furosemide  40 mg Oral BID   metoprolol succinate  25 mg Oral BID   multivitamin with minerals  1 tablet Oral Daily   nicotine  21 mg Transdermal Daily   pantoprazole  40 mg Oral Daily   QUEtiapine  25 mg Oral QHS   sodium chloride flush  3 mL Intravenous Q12H   spironolactone  12.5 mg Oral Daily   thiamine  100 mg Oral Daily   Or   thiamine  100 mg Intravenous Daily   Continuous Infusions:  PRN Meds: acetaminophen, methocarbamol, nitroGLYCERIN, ondansetron (ZOFRAN) IV, mouth rinse, polyethylene glycol, sodium chloride flush   Vital Signs    Vitals:   07/21/23 2317 07/22/23 0416 07/22/23 0526 07/22/23 0738  BP: 109/70 114/69  111/72  Pulse: 66 64  65  Resp: 18 19  18   Temp: 99 F (37.2 C) 98.4 F (36.9 C)  98.5 F (36.9 C)  TempSrc: Oral Oral    SpO2: 100% 100%  100%  Weight:   66.7 kg   Height:        Intake/Output Summary (Last 24 hours) at 07/22/2023 1208 Last data filed at 07/22/2023 0500 Gross per 24 hour  Intake 718 ml  Output 1100 ml  Net -382 ml      07/22/2023    5:26 AM 07/21/2023    4:57 AM 07/20/2023    5:00 AM  Last 3 Weights  Weight (lbs) 147 lb 0.8 oz 136 lb 0.4 oz 139 lb 12.4 oz  Weight (kg) 66.7 kg 61.7 kg 63.4 kg      Telemetry    Sinus rhythm  - Personally Reviewed  Physical Exam   GEN: No acute distress.   Neck: No JVD Cardiac: RRR, no murmurs, rubs, or gallops.  Respiratory: Clear to auscultation bilaterally. GI: Soft, nontender, non-distended  MS: No edema; No deformity. Neuro:  Nonfocal  Psych: Normal affect   Labs    High Sensitivity Troponin:   Recent Labs  Lab 07/10/23 1548 07/10/23 1552 07/18/23 1938 07/18/23 2102  TROPONINIHS 12 12 19* 19*     Chemistry Recent Labs  Lab 07/18/23 0315 07/19/23 0337 07/21/23 0631 07/22/23 0633  NA 131* 132* 136 135  K 3.3* 4.8 4.5 3.5  CL 93* 95* 103 99  CO2 25 23 24 26   GLUCOSE 130* 85 98 98  BUN 17 17 18 20   CREATININE 0.85 0.78 1.12 1.01  CALCIUM 9.5 9.5 9.3 9.3  MG 1.7 2.1 2.0  --   GFRNONAA >60 >60 >60 >60  ANIONGAP 13 14 9 10     Lipids No results for input(s): "CHOL", "TRIG", "HDL", "LABVLDL", "LDLCALC", "CHOLHDL"  in the last 168 hours.  Hematology Recent Labs  Lab 07/18/23 0315 07/19/23 0337 07/21/23 0631  WBC 4.0 5.2 5.8  RBC 3.91* 4.92 3.90*  HGB 12.5* 15.8 12.4*  HCT 35.9* 44.9 36.2*  MCV 91.8 91.3 92.8  MCH 32.0 32.1 31.8  MCHC 34.8 35.2 34.3  RDW 16.3* 16.6* 16.8*  PLT 280 308 290   Thyroid No results for input(s): "TSH", "FREET4" in the last 168 hours.  BNP Recent Labs  Lab 07/17/23 0436  BNP 585.4*    DDimer No results for input(s): "DDIMER" in the last 168 hours.   Radiology    DG Chest Port 1 View Result Date: 07/21/2023 CLINICAL DATA:  Short of breath EXAM: PORTABLE CHEST 1 VIEW COMPARISON:  07/11/2023 FINDINGS: Single frontal view of the chest demonstrates persistent enlargement of the cardiac silhouette. Improving vascular congestion and interstitial prominence, consistent with improved volume status. No acute airspace disease, effusion, or pneumothorax. No acute bony abnormalities. IMPRESSION: 1. Improving volume status, with persistent but improving interstitial edema. 2. Stable enlarged cardiac silhouette. Electronically  Signed   By: Sharlet Salina M.D.   On: 07/21/2023 09:47    Cardiac Studies   2D echo 07/13/2023: 1. Left ventricular ejection fraction, by estimation, is 30 to 35%. The  left ventricle has moderately decreased function. The left ventricle  demonstrates global hypokinesis. Left ventricular diastolic parameters are  indeterminate.   2. Right ventricular systolic function is moderately reduced. The right  ventricular size is mildly enlarged. There is mildly elevated pulmonary  artery systolic pressure.   3. Right atrial size was moderately dilated.   4. The mitral valve is normal in structure. Moderate mitral valve  regurgitation. No evidence of mitral stenosis.   5. The tricuspid valve is degenerative. Tricuspid valve regurgitation is  severe.   6. The aortic valve is tricuspid. Aortic valve regurgitation is mild. No  aortic stenosis is present.   7. The inferior vena cava is dilated in size with <50% respiratory  variability, suggesting right atrial pressure of 15 mmHg.   Patient Profile     68 y.o. male with history of medication noncompliance, coronary artery disease, atrial fibrillation on apixaban, hypertension, hyperlipidemia, chronic HFrEF, and an LVEF of 35-40%, frequent PVCs, alcohol abuse, recurrent alcoholic pancreatitis, who is being seen for further exacerbation of CHF and Afib/flutter with RVR.   Assessment & Plan    Atrial flutter with RVR with PAF - Long history of afib with medication noncompliance - EMS found patient to be in afib with RVR and lethargic with DCCV - Was maintaining sinus rhythm until 2/16 when he developed atrial flutter with 2:1 block and was subsequently placed on amiodarone gtt - Converted to sinus rhythm 2/18 at 2006 - CHA2DS2VASc 6 - TSH wnl - Recommend K > 4, repletion given and mag >2 - Continue amiodarone 400 mg BID x 7 days, 200 mg BID x 7 days, followed by 200 mg daily thereafter - Continue Toprol XL 25 mg BID and digoxin 0.0625 mg -  Continue Eliquis 5 mg BID  Acute on chronic combined CHF Alcohol induced cardiomyopathy Anasarca Medical noncompliance - History of cardiomyopathy since 11/2022 with echo at that time showing LVEF 35-40% with history of medical noncompliance and alcoholism - Echo 2/11 showed LVEF 30-35% with LV global hypokinesis, mildly elevated PA systolic pressures, mildly dilated RV, moderate MR, and severe TR - Net output since admission -21 L since admission - Cr stable with diuresis - Losartan held 2/17 for added  BP room for escalation of BB for rate control, hypotension precludes resumption at this time  - Continue to monitor kidney function, strict I/Os, and daily weights - Continue spirolactone 12.5 mg daily, Farxiga 10 mg daily, digoxin as above, Toprol XL as above, and Lasix 40 mg BID - Heart failure education - Overall poor prognosis  CAD with stable angina - LHC 11/2022 with severe single-vessel CAD with good collaterals and no targets for revascularization - Continues to deny chest pain - Troponin peaked at 19 likely secondary to demand ischemia in the setting of afib RVR - No plan for further ischemic evaluation at this time - Continue atorvastatin 40 mg daily - Eliquis in place of ASA  Hypokalemia - K 3.5, repletion ordered  NSVT - No further episodes - K and mag goals above - Pharmacotherapy as above  Weakness - Recommend continued work with PT - Overall poor nutrition and muscle atrophy in legs  Alcohol abuse 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

## 2023-07-22 NOTE — TOC Progression Note (Addendum)
Transition of Care [facility]) - Progression Note    Patient Details  Name: Darren Rogers MRN: [id number] Date of Birth: Mar 25, 1956  [facility] CM/SW Contact  Truddie Hidden, RN Phone Number: 07/22/2023, 2:43 PM  Clinical Narrative:    Sherron Monday with Maralyn Sago from [facility] regarding referral for [facility]. She stated her admins will have to review due to patient history before making a final decision. Patient UDS negative on admission. Message sent to MD regarding medical readiness.   2:54pm Per MD likely discharge readiness tomorrow morning.  Spoke with patient to advise [facility] referral acceptance was still being decided. He verbalized his understanding. Patient stated his cousin will be able to come pick him up but not before he gets off work around 6pm. Patient's sister may also be able to come pick him up.   3:58pm Per nurse, patient reports not having working heat in home. Attempt to reach patient on room and cell phone. No answer. Resources for shelter and housing added to AVS. Nurse and MD notified.      Expected Discharge Plan: Home/Self Care Barriers to Discharge: Continued Medical Work up  Expected Discharge Plan and Services       Living arrangements for the past 2 months: Single Family Home                                       Social Determinants of Health (SDOH) Interventions SDOH Screenings   Food Insecurity: No Food Insecurity (07/13/2023)  Housing: Low Risk  (07/13/2023)  Transportation Needs: Unmet Transportation Needs (07/13/2023)  Utilities: Not At Risk (07/11/2023)  Financial Resource Strain: Low Risk  (07/13/2023)  Social Connections: Moderately Isolated (07/11/2023)  Tobacco Use: High Risk (07/13/2023)    Readmission Risk Interventions    07/14/2023    3:49 PM 12/21/2022    9:13 AM  Readmission Risk Prevention Plan  Transportation Screening Complete Complete  PCP or Specialist Appt within 3-5 Days  Complete  HRI or Home Care Consult  Complete  Social  Work Consult for Recovery Care Planning/Counseling  Complete  Palliative Care Screening  Not Applicable  Medication Review Oceanographer) Complete Referral to Pharmacy  PCP or Specialist appointment within 3-5 days of discharge Complete   HRI or Home Care Consult Complete   SW Recovery Care/Counseling Consult Complete   Palliative Care Screening Not Applicable   Skilled Nursing Facility Complete

## 2023-07-22 NOTE — Progress Notes (Signed)
Physical Therapy Treatment Patient Details Name: Darren Rogers MRN: [id number] DOB: 07/31/1955 Today's Date: 07/22/2023   History of Present Illness Darren Rogers is a 68 y.o. male with medical history significant of medication noncompliance, CAD, chronic A-fib on Eliquis and digoxin, HTN, HLD, chronic HFrEF with LVEF 35-40%, frequent PVCs, alcohol abuse and recurrent alcoholic pancreatitis presented with increasing shortness of breath, peripheral edema feeling malaise.    PT Comments  Pt is making good progress towards goals with ability to ambulate in room using RW. Still demonstrates poor balance, however improved with with RW. Tends to pull up on RW despite cues. Will continue to progress as able.   If plan is discharge home, recommend the following: A little help with walking and/or transfers;A little help with bathing/dressing/bathroom;Assistance with cooking/housework;Direct supervision/assist for medications management;Assist for transportation;Help with stairs or ramp for entrance   Can travel by private vehicle        Equipment Recommendations  Rolling walker (2 wheels)    Recommendations for Other Services       Precautions / Restrictions Precautions Precautions: Fall Recall of Precautions/Restrictions: Intact Restrictions Weight Bearing Restrictions Per Provider Order: No     Mobility  Bed Mobility Overal bed mobility: Modified Independent Bed Mobility: Supine to Sit           General bed mobility comments: safe technique with use of B UE. Once sitting at EOB, upright posture noted    Transfers Overall transfer level: Needs assistance Equipment used: Rolling walker (2 wheels) Transfers: Sit to/from Stand Sit to Stand: Contact guard assist           General transfer comment: pulls up on RW despite cues. forward flexed posture    Ambulation/Gait Ambulation/Gait assistance: Contact guard assist Gait Distance (Feet): 40 Feet Assistive device: Rolling  walker (2 wheels) Gait Pattern/deviations: Decreased step length - right, Decreased step length - left       General Gait Details: ambulated in room with slow gait speed. Once back to bed, seated rest break given. When asked to do another lap, pt declined saying he was worn out   Optometrist     Tilt Bed    Modified Rankin (Stroke Patients Only)       Balance Overall balance assessment: Needs assistance Sitting-balance support: Bilateral upper extremity supported, Feet supported Sitting balance-Leahy Scale: Good     Standing balance support: Bilateral upper extremity supported Standing balance-Leahy Scale: Fair                              Hotel manager: No apparent difficulties  Cognition Arousal: Alert Behavior During Therapy: WFL for tasks assessed/performed   PT - Cognitive impairments: No apparent impairments                       PT - Cognition Comments: still grouchy, but slightly improved this session as PT warned him of session and he wasn't surprised Following commands: Intact      Cueing Cueing Techniques: Verbal cues  Exercises      General Comments        Pertinent Vitals/Pain Pain Assessment Pain Assessment: Faces Faces Pain Scale: Hurts a little bit Pain Location: Both calfs Pain Descriptors / Indicators: Aching Pain Intervention(s): Limited activity within patient's tolerance, Repositioned    Home Living  Prior Function            PT Goals (current goals can now be found in the care plan section) Acute Rehab PT Goals Patient Stated Goal: go home. PT Goal Formulation: With patient Time For Goal Achievement: 08/01/23 Potential to Achieve Goals: Good Progress towards PT goals: Progressing toward goals    Frequency    Min 1X/week      PT Plan      Co-evaluation              AM-PAC PT "6 Clicks"  Mobility   Outcome Measure  Help needed turning from your back to your side while in a flat bed without using bedrails?: A Little Help needed moving from lying on your back to sitting on the side of a flat bed without using bedrails?: A Little Help needed moving to and from a bed to a chair (including a wheelchair)?: A Little Help needed standing up from a chair using your arms (e.g., wheelchair or bedside chair)?: A Little Help needed to walk in hospital room?: A Little Help needed climbing 3-5 steps with a railing? : Total 6 Click Score: 16    End of Session   Activity Tolerance: Patient limited by pain Patient left: in bed;with nursing/sitter in room (seated at EOB, left with RN staff) Nurse Communication: Mobility status PT Visit Diagnosis: Unsteadiness on feet (R26.81);Difficulty in walking, not elsewhere classified (R26.2);Muscle weakness (generalized) (M62.81);History of falling (Z91.81);Pain Pain - Right/Left:  (Bilat) Pain - part of body: Leg     Time: 1415-1434 PT Time Calculation (min) (ACUTE ONLY): 19 min  Charges:    $Gait Training: 8-22 mins PT General Charges $$ ACUTE PT VISIT: 1 Visit                     Darren Rogers, PT, DPT, GCS [phone]    Darren Rogers 07/22/2023, 4:22 PM

## 2023-07-23 ENCOUNTER — Other Ambulatory Visit: Payer: Self-pay

## 2023-07-23 DIAGNOSIS — E872 Acidosis, unspecified: Secondary | ICD-10-CM

## 2023-07-23 DIAGNOSIS — I482 Chronic atrial fibrillation, unspecified: Secondary | ICD-10-CM | POA: Diagnosis not present

## 2023-07-23 DIAGNOSIS — F1020 Alcohol dependence, uncomplicated: Secondary | ICD-10-CM | POA: Diagnosis not present

## 2023-07-23 DIAGNOSIS — I5023 Acute on chronic systolic (congestive) heart failure: Secondary | ICD-10-CM | POA: Diagnosis not present

## 2023-07-23 DIAGNOSIS — J9601 Acute respiratory failure with hypoxia: Secondary | ICD-10-CM | POA: Diagnosis not present

## 2023-07-23 MED ORDER — APIXABAN 5 MG PO TABS
5.0000 mg | ORAL_TABLET | Freq: Two times a day (BID) | ORAL | 3 refills | Status: AC
  Filled 2023-07-23: qty 180, 90d supply, fill #0

## 2023-07-23 MED ORDER — FOLIC ACID 1 MG PO TABS
1.0000 mg | ORAL_TABLET | Freq: Every day | ORAL | 1 refills | Status: DC
  Filled 2023-07-23: qty 90, 90d supply, fill #0

## 2023-07-23 MED ORDER — DAPAGLIFLOZIN PROPANEDIOL 10 MG PO TABS
10.0000 mg | ORAL_TABLET | Freq: Every day | ORAL | 2 refills | Status: AC
  Filled 2023-07-23: qty 90, 90d supply, fill #0

## 2023-07-23 MED ORDER — PANTOPRAZOLE SODIUM 40 MG PO TBEC
40.0000 mg | DELAYED_RELEASE_TABLET | Freq: Every day | ORAL | 2 refills | Status: AC
  Filled 2023-07-23: qty 90, 90d supply, fill #0

## 2023-07-23 MED ORDER — QUETIAPINE FUMARATE 25 MG PO TABS
25.0000 mg | ORAL_TABLET | Freq: Every day | ORAL | 1 refills | Status: DC
  Filled 2023-07-23: qty 90, 90d supply, fill #0

## 2023-07-23 MED ORDER — AMIODARONE HCL 200 MG PO TABS
ORAL_TABLET | ORAL | 0 refills | Status: AC
  Filled 2023-07-23: qty 112, 90d supply, fill #0

## 2023-07-23 MED ORDER — FUROSEMIDE 40 MG PO TABS
40.0000 mg | ORAL_TABLET | Freq: Two times a day (BID) | ORAL | 2 refills | Status: AC
  Filled 2023-07-23: qty 180, 90d supply, fill #0

## 2023-07-23 MED ORDER — DIGOXIN 125 MCG PO TABS
0.0625 mg | ORAL_TABLET | Freq: Every day | ORAL | 1 refills | Status: AC
  Filled 2023-07-23: qty 45, 90d supply, fill #0
  Filled 2023-07-23: qty 30, fill #0

## 2023-07-23 MED ORDER — METOPROLOL SUCCINATE ER 25 MG PO TB24
25.0000 mg | ORAL_TABLET | Freq: Two times a day (BID) | ORAL | 2 refills | Status: AC
  Filled 2023-07-23: qty 180, 90d supply, fill #0

## 2023-07-23 MED ORDER — POLYETHYLENE GLYCOL 3350 17 GM/SCOOP PO POWD
17.0000 g | Freq: Every day | ORAL | 0 refills | Status: AC | PRN
  Filled 2023-07-23: qty 238, 14d supply, fill #0

## 2023-07-23 MED ORDER — SPIRONOLACTONE 25 MG PO TABS
12.5000 mg | ORAL_TABLET | Freq: Every day | ORAL | 3 refills | Status: AC
  Filled 2023-07-23: qty 45, 90d supply, fill #0

## 2023-07-23 MED ORDER — VITAMIN B-1 100 MG PO TABS
100.0000 mg | ORAL_TABLET | Freq: Every day | ORAL | 1 refills | Status: DC
  Filled 2023-07-23: qty 90, 90d supply, fill #0

## 2023-07-23 MED ORDER — ATORVASTATIN CALCIUM 40 MG PO TABS
40.0000 mg | ORAL_TABLET | Freq: Every day | ORAL | 1 refills | Status: DC
  Filled 2023-07-23: qty 90, 90d supply, fill #0

## 2023-07-23 NOTE — Discharge Summary (Signed)
Physician Discharge Summary   Patient: Darren Rogers MRN: [id number] DOB: 11-Feb-1956  Admit date:     07/10/2023  Discharge date: 07/23/23  Discharge Physician: Arnetha Courser   PCP: Pcp, No   Recommendations at discharge:  Please obtain CBC and BMP and follow-up Follow-up with primary care provider Follow-up with cardiology and heart failure clinic  Discharge Diagnoses: Principal Problem:   CHF (congestive heart failure) (HCC) Active Problems:   Chronic atrial fibrillation with RVR (HCC)   Hyponatremia   A-fib (HCC)   Alcohol use disorder, moderate, dependence (HCC)   Lactic acidosis   Acute HFrEF (heart failure with reduced ejection fraction) (HCC)   Alcoholic cirrhosis of liver with ascites (HCC)   Anasarca   Overweight (BMI 25.0-29.9)   Acute on chronic HFrEF (heart failure with reduced ejection fraction) (HCC)   Acute respiratory failure with hypoxia (HCC)   Dilated cardiomyopathy (HCC)   History of medication noncompliance   Hospital Course: 3 Darren Rogers is a 68 y.o. male with medical history significant of medication noncompliance, CAD, chronic A-fib on Eliquis and digoxin, HTN, HLD, chronic HFrEF with LVEF 35-40%, frequent PVCs, alcohol abuse and recurrent alcoholic pancreatitis presented with increasing shortness of breath, peripheral edema feeling malaise. Patient drinks 48 ounces of beer twice a day, he has created taking his medicines. Upon arriving to hospital, patient was found to have anasarca, chest x-ray showed cardiomegaly with pulmonary edema, significant elevated BNP.  Patient was started on diuretics for CHF exacerbation.  He was diuresed well, initially with IV Lasix followed by p.o.  Echocardiogram with EF of 30 to 35%. He is being discharged on p.o. Lasix 40 mg twice daily, spironolactone, metoprolol and Farxiga.  Patient developed A-fib with RVR while in the hospital.  Initially received amiodarone infusion, spontaneously converted back to sinus rhythm.   Converted to p.o. amiodarone, he should be taking 400 mg twice daily for next 5 days followed by 200 mg twice daily for 7 days and then 200 mg daily.  Patient has a longstanding history of being noncompliant, counseling was again provided and he will need a close follow-up for further assistance.  Patient need to continue with Eliquis.  Patient had electrolyte abnormalities include hyponatremia, hypokalemia and hypophosphatemia during hospitalization which were corrected before discharge.  Patient has an history of alcoholic liver cirrhosis with ascites.  Abdominal distention with significant improvement with Lasix and Aldactone.  Alcohol cessation recommended and he was counseled.  Patient should continue on current medications and need to have a close follow-up with his providers for further management.   Consultants: Cardiology Procedures performed: None Disposition: Home health Diet recommendation:  Discharge Diet Orders (From admission, onward)     Start     Ordered   07/23/23 0000  Diet - low sodium heart healthy        07/23/23 1000           Cardiac diet DISCHARGE MEDICATION: Allergies as of 07/23/2023   No Known Allergies      Medication List     STOP taking these medications    aspirin EC 81 MG tablet   polyethylene glycol 17 g packet Commonly known as: MIRALAX / GLYCOLAX Replaced by: polyethylene glycol powder 17 GM/SCOOP powder   sacubitril-valsartan 24-26 MG Commonly known as: ENTRESTO       TAKE these medications    amiodarone 200 MG tablet Commonly known as: PACERONE Take 2 tablets (400 mg total) by mouth 2 (two) times daily for 5 days, THEN  1 tablet (200 mg total) 2 (two) times daily for 7 days, THEN 1 tablet (200 mg total) daily. Start taking on: July 23, 2023   apixaban 5 MG Tabs tablet Commonly known as: ELIQUIS Take 1 tablet (5 mg total) by mouth 2 (two) times daily.   atorvastatin 40 MG tablet Commonly known as: LIPITOR Take 1  tablet (40 mg total) by mouth daily.   dapagliflozin propanediol 10 MG Tabs tablet Commonly known as: FARXIGA Take 1 tablet (10 mg total) by mouth daily. Start taking on: July 24, 2023   digoxin 0.125 MG tablet Commonly known as: LANOXIN Take 0.5 tablets (0.0625 mg total) by mouth daily. Start taking on: July 24, 2023 What changed: how much to take   folic acid 1 MG tablet Commonly known as: FOLVITE Take 1 tablet (1 mg total) by mouth daily. Start taking on: July 24, 2023   furosemide 40 MG tablet Commonly known as: LASIX Take 1 tablet (40 mg total) by mouth 2 (two) times daily. What changed:  medication strength when to take this reasons to take this additional instructions   metoprolol succinate 25 MG 24 hr tablet Commonly known as: TOPROL-XL Take 1 tablet (25 mg total) by mouth 2 (two) times daily. What changed:  medication strength how much to take additional instructions   multivitamin with minerals Tabs tablet Take 1 tablet by mouth daily.   nicotine 21 mg/24hr patch Commonly known as: NICODERM CQ - dosed in mg/24 hours Place 1 patch (21 mg total) onto the skin daily.   nitroGLYCERIN 0.4 MG SL tablet Commonly known as: NITROSTAT Place 1 tablet (0.4 mg total) under the tongue every 5 (five) minutes x 3 doses as needed for chest pain.   pantoprazole 40 MG tablet Commonly known as: PROTONIX Take 1 tablet (40 mg total) by mouth daily.   polyethylene glycol powder 17 GM/SCOOP powder Commonly known as: GLYCOLAX/MIRALAX Take 17 g by mouth daily as needed for mild constipation. Mix as directed. Replaces: polyethylene glycol 17 g packet   QUEtiapine 25 MG tablet Commonly known as: SEROQUEL Take 1 tablet (25 mg total) by mouth at bedtime.   spironolactone 25 MG tablet Commonly known as: ALDACTONE Take 0.5 tablets (12.5 mg total) by mouth daily.   thiamine 100 MG tablet Commonly known as: Vitamin B-1 Take 1 tablet (100 mg total) by mouth  daily. Start taking on: July 24, 2023        Follow-up Information     [facility]. Go on 07/27/2023.   Specialty: Cardiology Why: Hospital follow-up 07/27/23 @ 2:30pm Please bring all medications to follow-up appt. [facility], [address], Second Floor Free Valet Parking at the [employer] information: [address] [place] [address] [phone]               Discharge Exam: Ceasar Mons Weights   07/21/23 0457 07/22/23 0526 07/23/23 0500  Weight: 61.7 kg 66.7 kg 62.5 kg   General.  Well-developed gentleman, in no acute distress. Pulmonary.  Lungs clear bilaterally, normal respiratory effort. CV.  Regular rate and rhythm, no JVD, rub or murmur. Abdomen.  Soft, nontender, nondistended, BS positive. CNS.  Alert and oriented .  No focal neurologic deficit. Extremities.  No edema, no cyanosis, pulses intact and symmetrical. Psychiatry.  Judgment and insight appears normal.   Condition at discharge: stable  The results of significant diagnostics from this hospitalization (including imaging, microbiology, ancillary and laboratory) are listed  below for reference.   Imaging Studies: DG Chest Port 1 View Result Date: 07/21/2023 CLINICAL DATA:  Short of breath EXAM: PORTABLE CHEST 1 VIEW COMPARISON:  07/11/2023 FINDINGS: Single frontal view of the chest demonstrates persistent enlargement of the cardiac silhouette. Improving vascular congestion and interstitial prominence, consistent with improved volume status. No acute airspace disease, effusion, or pneumothorax. No acute bony abnormalities. IMPRESSION: 1. Improving volume status, with persistent but improving interstitial edema. 2. Stable enlarged cardiac silhouette. Electronically Signed   By: Sharlet Salina M.D.   On: 07/21/2023 09:47   ECHOCARDIOGRAM COMPLETE Result Date: 07/14/2023    ECHOCARDIOGRAM REPORT   Patient Name:   JOHNELL BAS  Date of Exam: 07/13/2023 Medical Rec #:  [id number]    Height:       66.0 in Accession #:    [id number]   Weight:       171.1 lb Date of Birth:  1956/03/05   BSA:          1.871 m Patient Age:    67 years     BP:           91/74 mmHg Patient Gender: M            HR:           82 bpm. Exam Location:  [facility] Procedure: 2D Echo, Cardiac Doppler, Color Doppler and Intracardiac            Opacification Agent (Both Spectral and Color Flow Doppler were            utilized during procedure). Indications:     I50.9 Congestive heart failure.  History:         Patient has prior history of Echocardiogram examinations, most                  recent 12/21/2022. CHF, CAD, Arrythmias:PVC and Atrial                  Fibrillation; Risk Factors:Hypertension, Dyslipidemia and                  Current Smoker.  Sonographer:     Daphine Deutscher RDCS Referring Phys:  [id number] Francee Nodal FURTH Diagnosing Phys: Cristal Deer End MD IMPRESSIONS  1. Left ventricular ejection fraction, by estimation, is 30 to 35%. The left ventricle has moderately decreased function. The left ventricle demonstrates global hypokinesis. Left ventricular diastolic parameters are indeterminate.  2. Right ventricular systolic function is moderately reduced. The right ventricular size is mildly enlarged. There is mildly elevated pulmonary artery systolic pressure.  3. Right atrial size was moderately dilated.  4. The mitral valve is normal in structure. Moderate mitral valve regurgitation. No evidence of mitral stenosis.  5. The tricuspid valve is degenerative. Tricuspid valve regurgitation is severe.  6. The aortic valve is tricuspid. Aortic valve regurgitation is mild. No aortic stenosis is present.  7. The inferior vena cava is dilated in size with <50% respiratory variability, suggesting right atrial pressure of 15 mmHg. FINDINGS  Left Ventricle: Left ventricular ejection fraction, by estimation, is 30 to 35%. The left ventricle has moderately decreased function.  The left ventricle demonstrates global hypokinesis. Definity contrast agent was given IV to delineate the left ventricular endocardial borders. Strain imaging was not performed. The left ventricular internal cavity size was normal in size. There is no left ventricular hypertrophy. Left ventricular diastolic parameters are indeterminate. Right Ventricle: The right ventricular size is mildly enlarged. No increase in right ventricular wall thickness.  Right ventricular systolic function is moderately reduced. There is mildly elevated pulmonary artery systolic pressure. The tricuspid regurgitant velocity is 2.71 m/s, and with an assumed right atrial pressure of 15 mmHg, the estimated right ventricular systolic pressure is 44.4 mmHg. Left Atrium: Left atrial size was normal in size. Right Atrium: Right atrial size was moderately dilated. Pericardium: There is no evidence of pericardial effusion. Mitral Valve: The mitral valve is normal in structure. Moderate mitral valve regurgitation. No evidence of mitral valve stenosis. Tricuspid Valve: The tricuspid valve is degenerative in appearance. Tricuspid valve regurgitation is severe. Aortic Valve: The aortic valve is tricuspid. Aortic valve regurgitation is mild. No aortic stenosis is present. Aortic valve mean gradient measures 4.2 mmHg. Aortic valve peak gradient measures 9.6 mmHg. Aortic valve area, by VTI measures 1.50 cm. Pulmonic Valve: The pulmonic valve was normal in structure. Pulmonic valve regurgitation is mild. No evidence of pulmonic stenosis. Aorta: The aortic root and ascending aorta are structurally normal, with no evidence of dilitation. Pulmonary Artery: The pulmonary artery is of normal size. Venous: The inferior vena cava is dilated in size with less than 50% respiratory variability, suggesting right atrial pressure of 15 mmHg. IAS/Shunts: The interatrial septum was not well visualized. Additional Comments: 3D imaging was not performed.  LEFT VENTRICLE  PLAX 2D LVIDd:         4.70 cm LVIDs:         3.70 cm LV PW:         0.90 cm LV IVS:        0.80 cm LVOT diam:     1.90 cm LV SV:         31 LV SV Index:   17 LVOT Area:     2.84 cm  RIGHT VENTRICLE             IVC RV Basal diam:  4.60 cm     IVC diam: 2.30 cm RV S prime:     11.70 cm/s TAPSE (M-mode): 1.5 cm LEFT ATRIUM             Index        RIGHT ATRIUM           Index LA diam:        4.60 cm 2.46 cm/m   RA Area:     28.60 cm LA Vol (A2C):   60.5 ml 32.33 ml/m  RA Volume:   108.00 ml 57.71 ml/m LA Vol (A4C):   49.1 ml 26.24 ml/m LA Biplane Vol: 57.7 ml 30.83 ml/m  AORTIC VALVE AV Area (Vmax):    1.27 cm AV Area (Vmean):   1.26 cm AV Area (VTI):     1.50 cm AV Vmax:           155.27 cm/s AV Vmean:          93.684 cm/s AV VTI:            0.210 m AV Peak Grad:      9.6 mmHg AV Mean Grad:      4.2 mmHg LVOT Vmax:         69.53 cm/s LVOT Vmean:        41.567 cm/s LVOT VTI:          0.111 m LVOT/AV VTI ratio: 0.53  AORTA Ao Root diam: 3.50 cm Ao Asc diam:  3.40 cm MV E velocity: 98.65 cm/s  TRICUSPID VALVE  TR Peak grad:   29.4 mmHg                            TR Vmax:        271.00 cm/s                             SHUNTS                            Systemic VTI:  0.11 m                            Systemic Diam: 1.90 cm Yvonne Kendall MD Electronically signed by Yvonne Kendall MD Signature Date/Time: 07/14/2023/7:45:50 AM    Final    DG Chest 1 View Result Date: 07/11/2023 CLINICAL DATA:  None 7293. CHF with atrial fibrillation and rapid ventricular response. EXAM: CHEST  1 VIEW COMPARISON:  Portable chest 07/10/2023 at 4:04 p.m. FINDINGS: 6:47 a.m. The patient's left arm and hand superimpose across the lower right chest. The heart is enlarged. Perihilar vascular congestion and mild lower zonal interstitial edema continue to be seen with small pleural effusions. There is hazy perihilar interstitial change increased in the interval which could be ground-glass edema or pneumonitis.  There are no further new findings. The mediastinum is stable. Osseous structures grossly intact. IMPRESSION: 1. Continued perihilar vascular congestion and mild lower zonal interstitial edema. 2. Hazy perihilar interstitial change increased in the interval which could be ground-glass edema or pneumonitis. 3. Small pleural effusions, unchanged. Electronically Signed   By: Almira Bar M.D.   On: 07/11/2023 07:41   US Abdomen Limited RUQ (LIVER/GB) Result Date: 07/10/2023 CLINICAL DATA:  [id number] LFTs abnormal [id number] EXAM: ULTRASOUND ABDOMEN LIMITED RIGHT UPPER QUADRANT COMPARISON:  03/21/2023 CT abdomen/pelvis FINDINGS: Gallbladder: Mildly contracted gallbladder. Apparent mild gallbladder wall thickening. No pericholecystic fluid. No sonographic Murphy's sign. No gallstones. Common bile duct: Diameter: 2 mm Liver: Liver surface appears mildly irregular with slightly coarsened liver echotexture, suggesting cirrhosis. No liver masses. Portal vein is patent on color Doppler imaging with normal direction of blood flow towards the liver. Other: Small volume perihepatic ascites. Right pleural effusion noted. IMPRESSION: 1. No gallstones. Apparent mild gallbladder wall thickening is probably reactive and/or due to contracted state. 2. No biliary ductal dilatation. 3. Sonographic findings suggestive of cirrhosis.  No liver masses. 4. Small volume perihepatic ascites. 5. Right pleural effusion. Electronically Signed   By: Delbert Phenix M.D.   On: 07/10/2023 18:16   DG Chest Portable 1 View Result Date: 07/10/2023 CLINICAL DATA:  Shortness of breath EXAM: PORTABLE CHEST 1 VIEW COMPARISON:  03/23/2023 FINDINGS: Cardiomegaly. Mild diffuse interstitial opacity. Small right pleural effusion. The visualized skeletal structures are unremarkable. IMPRESSION: Cardiomegaly with mild diffuse interstitial opacity, consistent with edema. Small right pleural effusion. No focal airspace opacity. Electronically Signed   By: Jearld Lesch  M.D.   On: 07/10/2023 16:12    Microbiology: Results for orders placed or performed during the hospital encounter of 07/10/23  Culture, blood (routine x 2)     Status: None   Collection Time: 07/10/23  3:48 PM   Specimen: BLOOD  Result Value Ref Range Status   Specimen Description BLOOD BACTERIAL CASTS  Final   Special Requests   Final    BOTTLES DRAWN AEROBIC AND ANAEROBIC Blood  Culture results may not be optimal due to an inadequate volume of blood received in culture bottles   Culture   Final    NO GROWTH 5 DAYS Performed at [facility], [address] [place], [place] [address]    Report Status 07/15/2023 FINAL  Final  Culture, blood (routine x 2)     Status: None   Collection Time: 07/10/23  3:48 PM   Specimen: BLOOD  Result Value Ref Range Status   Specimen Description BLOOD BLOOD LEFT ARM  Final   Special Requests   Final    BOTTLES DRAWN AEROBIC AND ANAEROBIC Blood Culture results may not be optimal due to an inadequate volume of blood received in culture bottles   Culture   Final    NO GROWTH 5 DAYS Performed at [facility], [address] [place], [place] [address]    Report Status 07/15/2023 FINAL  Final  Resp panel by RT-PCR (RSV, Flu A&B, Covid) Anterior Nasal Swab     Status: None   Collection Time: 07/10/23  3:52 PM   Specimen: Anterior Nasal Swab  Result Value Ref Range Status   SARS Coronavirus 2 by RT PCR NEGATIVE NEGATIVE Final    Comment: (NOTE) SARS-CoV-2 target nucleic acids are NOT DETECTED.  The SARS-CoV-2 RNA is generally detectable in upper respiratory specimens during the acute phase of infection. The lowest concentration of SARS-CoV-2 viral copies this assay can detect is 138 copies/mL. A negative result does not preclude SARS-Cov-2 infection and should not be used as the sole basis for treatment or other patient management decisions. A negative result may occur with  improper specimen collection/handling, submission of  specimen other than nasopharyngeal swab, presence of viral mutation(s) within the areas targeted by this assay, and inadequate number of viral copies(<138 copies/mL). A negative result must be combined with clinical observations, patient history, and epidemiological information. The expected result is Negative.  Fact Sheet for Patients:  BloggerCourse.com  Fact Sheet for Healthcare Providers:  SeriousBroker.it  This test is no t yet approved or cleared by the [place] FDA and  has been authorized for detection and/or diagnosis of SARS-CoV-2 by FDA under an Emergency Use Authorization (EUA). This EUA will remain  in effect (meaning this test can be used) for the duration of the COVID-19 declaration under Section 564(b)(1) of the Act, 21 U.S.C.section 360bbb-3(b)(1), unless the authorization is terminated  or revoked sooner.       Influenza A by PCR NEGATIVE NEGATIVE Final   Influenza B by PCR NEGATIVE NEGATIVE Final    Comment: (NOTE) The Xpert Xpress SARS-CoV-2/FLU/RSV plus assay is intended as an aid in the diagnosis of influenza from Nasopharyngeal swab specimens and should not be used as a sole basis for treatment. Nasal washings and aspirates are unacceptable for Xpert Xpress SARS-CoV-2/FLU/RSV testing.  Fact Sheet for Patients: BloggerCourse.com  Fact Sheet for Healthcare Providers: SeriousBroker.it  This test is not yet approved or cleared by the [place] FDA and has been authorized for detection and/or diagnosis of SARS-CoV-2 by FDA under an Emergency Use Authorization (EUA). This EUA will remain in effect (meaning this test can be used) for the duration of the COVID-19 declaration under Section 564(b)(1) of the Act, 21 U.S.C. section 360bbb-3(b)(1), unless the authorization is terminated or revoked.     Resp Syncytial Virus by PCR NEGATIVE NEGATIVE Final     Comment: (NOTE) Fact Sheet for Patients: BloggerCourse.com  Fact Sheet for Healthcare Providers: SeriousBroker.it  This test is  not yet approved or cleared by the [place] and has been authorized for detection and/or diagnosis of SARS-CoV-2 by FDA under an Emergency Use Authorization (EUA). This EUA will remain in effect (meaning this test can be used) for the duration of the COVID-19 declaration under Section 564(b)(1) of the Act, 21 U.S.C. section 360bbb-3(b)(1), unless the authorization is terminated or revoked.  Performed at [facility], [address] [place], [place] [address]     Labs: CBC: Recent Labs  Lab 07/17/23 [phone] 07/18/23 0315 07/19/23 0337 07/21/23 0631  WBC 4.0 4.0 5.2 5.8  HGB 12.3* 12.5* 15.8 12.4*  HCT 34.0* 35.9* 44.9 36.2*  MCV 89.2 91.8 91.3 92.8  PLT 251 280 308 290   Basic Metabolic Panel: Recent Labs  Lab 07/17/23 0436 07/18/23 0315 07/19/23 0337 07/21/23 0631 07/22/23 0633  NA 131* 131* 132* 136 135  K 3.3* 3.3* 4.8 4.5 3.5  CL 96* 93* 95* 103 99  CO2 24 25 23 24 26   GLUCOSE 151* 130* 85 98 98  BUN 14 17 17 18 20   CREATININE 0.90 0.85 0.78 1.12 1.01  CALCIUM 9.4 9.5 9.5 9.3 9.3  MG 1.8 1.7 2.1 2.0  --   PHOS 1.8* 4.9* 2.7  --   --    Liver Function Tests: No results for input(s): "AST", "ALT", "ALKPHOS", "BILITOT", "PROT", "ALBUMIN" in the last 168 hours. CBG: Recent Labs  Lab 07/18/23 1808  GLUCAP 136*    Discharge time spent: greater than 30 minutes.  This record has been created using Conservation officer, historic buildings. Errors have been sought and corrected,but may not always be located. Such creation errors do not reflect on the standard of care.   Signed: Arnetha Courser, MD [facility] 07/23/2023

## 2023-07-23 NOTE — Plan of Care (Signed)
   Problem: Clinical Measurements: Goal: Ability to maintain clinical measurements within normal limits will improve Outcome: Progressing Goal: Respiratory complications will improve Outcome: Progressing Goal: Cardiovascular complication will be avoided Outcome: Progressing

## 2023-07-23 NOTE — Progress Notes (Signed)
Heart Failure Stewardship Pharmacy Note  PCP: Pcp, No PCP-Cardiologist: Lorine Bears, MD  HPI: Darren Rogers is a 68 y.o. male with former polysubstance use (cocaine, alcohol, tobacco), recurrent alcoholic pancreatitis, A fib on Eliquis and digoxin, HTN, HLD, depression, CAD, PAD with claudication, BPH, PVC who presented with heart racing, fall, and mild shortness of breath. Patient had been nonadherent to HF and AF medications including Eliquis prior to admission. On admission, BNP was 899.4, HS-troponin was 12, lactic acid was 2.6, digoxin level was <0.2, TSH was 3.879, ethanol was negative, and UDS was negative. Chest x-ray noted perihilar vascular congestion, mild lower zonal interstitial edema and small pleural effusions. Converted to NSR with amiodarone on the evening of 07/20/23.  Pertinent cardiac history: TTE in 05/2015 showed LEF 60-65%. Moderate blockage noted on arterial doppler in 06/2015. Stress test in 06/2015 showed T wave inversion in I, II, III, V4, V5, V6 during stress. A 2% PVC burden was noted 04/2017 via long term event monitor. TTE 07/2020 showed LVEF 50-55%. TTE 20/2022 showed LVEF 55-60% and low normal RV function. Event monitor 04/2021 showed 100% AF burden with rate of 99. TTE 11/2022 showed LVEF down to 35-40%, normal RV, mild-moderate MR, moderate TR, mild AR, and moderate PR. LHC 11/2022 showed severe single-vessel disease with CTO of Lcx/LPLA with left-to-left collaterals. During that time PVR was 5, PCWP was 18, and CI was 2.1.   Pertinent Lab Values: Creatinine  Date Value Ref Range Status  09/21/2014 1.10 mg/dL Final    Comment:    2.84-1.32 NOTE: New Reference Range  08/07/14    Creatinine, Ser  Date Value Ref Range Status  07/22/2023 1.01 0.61 - 1.24 mg/dL Final   BUN  Date Value Ref Range Status  07/22/2023 20 8 - 23 mg/dL Final  44/06/270 9 8 - 27 mg/dL Final  53/66/4403 15 mg/dL Final    Comment:    4-74 NOTE: New Reference Range  08/07/14     Potassium  Date Value Ref Range Status  07/22/2023 3.5 3.5 - 5.1 mmol/L Final  09/21/2014 3.5 mmol/L Final    Comment:    3.5-5.1 NOTE: New Reference Range  08/07/14    Sodium  Date Value Ref Range Status  07/22/2023 135 135 - 145 mmol/L Final  08/26/2021 140 134 - 144 mmol/L Final  09/21/2014 139 mmol/L Final    Comment:    135-145 NOTE: New Reference Range  08/07/14    B Natriuretic Peptide  Date Value Ref Range Status  07/17/2023 585.4 (H) 0.0 - 100.0 pg/mL Final    Comment:    Performed at [facility], [address] [place], [place] [address]   Magnesium  Date Value Ref Range Status  07/21/2023 2.0 1.7 - 2.4 mg/dL Final    Comment:    Performed at [facility], [address] [place], [place] [address]  09/21/2014 1.8 mg/dL Final    Comment:    4.3-3.2 THERAPEUTIC RANGE: 4-7 mg/dL TOXIC: > 10 mg/dL  ----------------------- NOTE: New Reference Range  08/07/14    Hemoglobin A1C  Date Value Ref Range Status  03/28/2014 6.4 (H) 4.2 - 6.3 % Final    Comment:    The American Diabetes Association recommends that a primary goal of therapy should be <7% and that physicians should reevaluate the treatment regimen in patients with HbA1c values consistently >8%.    HB A1C (BAYER DCA - WAIVED)  Date Value Ref Range Status  07/30/2016 6.1 <7.0 % Final  Comment:                                          Diabetic Adult            <7.0                                       Healthy Adult        4.3 - 5.7                                                           (DCCT/NGSP) American Diabetes Association's Summary of Glycemic Recommendations for Adults with Diabetes: Hemoglobin A1c <7.0%. More stringent glycemic goals (A1c <6.0%) may further reduce complications at the cost of increased risk of hypoglycemia.    Hgb A1c MFr Bld  Date Value Ref Range Status  03/22/2023 5.5 4.8 - 5.6 % Final    Comment:    (NOTE) Pre diabetes:           5.7%-6.4%  Diabetes:              >6.4%  Glycemic control for   <7.0% adults with diabetes    Digoxin Level  Date Value Ref Range Status  07/15/2023 0.6 (L) 0.8 - 2.0 ng/mL Final    Comment:    Performed at [facility], [address] [place], [place] [address]   TSH  Date Value Ref Range Status  07/10/2023 3.879 0.350 - 4.500 uIU/mL Final    Comment:    Performed by a 3rd Generation assay with a functional sensitivity of <=0.01 uIU/mL. Performed at [facility], [address] [place], [place] [address]   08/26/2021 1.600 0.450 - 4.500 uIU/mL Final    Vital Signs: Admission weight: 184.7 lbs Temp:  [96.9 F (36.1 C)-98.7 F (37.1 C)] 97.5 F (36.4 C) (02/21 0901) Pulse Rate:  [63-70] 69 (02/21 0901) Cardiac Rhythm: Normal sinus rhythm (02/21 0709) Resp:  [16-18] 16 (02/21 0901) BP: (104-119)/(61-82) 119/74 (02/21 0901) SpO2:  [95 %-100 %] 100 % (02/21 0901) Weight:  [62.5 kg (137 lb 12.6 oz)] 62.5 kg (137 lb 12.6 oz) (02/21 0500)  Intake/Output Summary (Last 24 hours) at 07/23/2023 1004 Last data filed at 07/23/2023 8295 Gross per 24 hour  Intake 363 ml  Output 2075 ml  Net -1712 ml    Current Heart Failure Medications:  Loop diuretic: Furosemide 40 mg PO BID Beta-Blocker: Metoprolol succinate 25 mg BID ACEI/ARB/ARNI: none MRA: spironolactone 12.5 mg daily SGLT2i: Farxiga 10 mg daily Other: Digoxin 0.0625 mg daily   Prior to admission Heart Failure Medications:  Patient reported filling medications at [facility], however, after speaking with them on the phone, there have been no prescriptions filled in the last year. After discussing this with the patient he admits to being off medications for a very long time.  Assessment: 1. Acute on chronic systolic heart failure (LVEF 30-35%), due to predominant NICM (high AF burden). NYHA class II symptoms.  -Symptoms: Significant improvement in shortness of breath and orthopnea. LEE  resolved. No complaints today. Felt good while ambulating yesterday. -  Volume: Appears euvolemic on exam. No labs drawn today. On furosemide 40 mg PO BID. Weight stable.   -Hemodynamics: BP is normal and HR remains 60s >On amiodarone for rhythm control.  -BB: Metoprolol succinate 25 mg BID.  -ACEI/ARB/ARNI: Sherryll Burger was stopped for soft BP. Losartan was started without issues, but discontinued to allow BB titration given high rates. Can consider restarting losartan 12.5 mg daily. -MRA: On low dose spironolactone given BP. Can consider increasing prior to discharge pending BP and BMP.  -SGLT2i: Continue Farxiga 10 mg daily.  Plan: 1) Medication changes recommended at this time: -Consider starting losartan 12.5 mg daily   2) Patient assistance: -Copay for Sherryll Burger, Farxiga, Jardiance, Eliquis, and Xarelto are $0 -Patient would like to receive medications via [facility] mail order pharmacy in the future for convenience.  3) Education: - Patient has been educated on current HF medications and potential additions to HF medication regimen - Patient verbalizes understanding that over the next few months, these medication doses may change and more medications may be added to optimize HF regimen - Patient has been educated on basic disease state pathophysiology and goals of therapy  Medication Assistance / Insurance Benefits Check: Does the patient have prescription insurance?    Type of insurance plan:  Does the patient qualify for medication assistance through manufacturers or grants? Pending  Eligible grants and/or patient assistance programs: Pending  Medication assistance applications in progress: Pending  Medication assistance applications approved: Pending Approved medication assistance renewals will be completed by: Pending  Outpatient Pharmacy: Prior to admission outpatient pharmacy: Not filling medications PTA Is the patient agreeable to switch to [facility] Outpatient  Pharmacy?: Yes Is the patient willing to utilize a [facility] pharmacy at discharge?: Yes  Please do not hesitate to reach out with questions or concerns,  Enos Fling, PharmD, CPP, BCPS Heart Failure Pharmacist  Phone - [phone] 07/23/2023 10:04 AM

## 2023-07-23 NOTE — TOC Transition Note (Signed)
Transition of Care [facility]) - Discharge Note   Patient Details  Name: Darren Rogers MRN: [id number] Date of Birth: [birth year]  Transition of Care [facility]) CM/SW Contact:  Truddie Hidden, RN Phone Number: 07/23/2023, 12:12 PM   Clinical Narrative:    Spoke with patient regarding discharge home today. He stated his heat works but intermittently cuts off. Patient advised to contact DSS for possible resources. Shelter and housing information provided as well. He will arranged his own ride home. RNCM advised the [facility] agency would contact him to schedule SOC.   RN notified patient is arranging his ride. TOC signing off.      Barriers to Discharge: Continued Medical Work up   Patient Goals and CMS Choice Patient states their goals for this hospitalization and ongoing recovery are:: Home          Discharge Placement                       Discharge Plan and Services Additional resources added to the After Visit Summary for                                       Social Drivers of Health (SDOH) Interventions SDOH Screenings   Food Insecurity: No Food Insecurity (07/13/2023)  Housing: Low Risk  (07/13/2023)  Transportation Needs: Unmet Transportation Needs (07/13/2023)  Utilities: Not At Risk (07/11/2023)  Financial Resource Strain: Low Risk  (07/13/2023)  Social Connections: Moderately Isolated (07/11/2023)  Tobacco Use: High Risk (07/13/2023)     Readmission Risk Interventions    07/14/2023    3:49 PM 12/21/2022    9:13 AM  Readmission Risk Prevention Plan  Transportation Screening Complete Complete  PCP or Specialist Appt within 3-5 Days  Complete  HRI or Home Care Consult  Complete  Social Work Consult for Recovery Care Planning/Counseling  Complete  Palliative Care Screening  Not Applicable  Medication Review Oceanographer) Complete Referral to Pharmacy  PCP or Specialist appointment within 3-5 days of discharge Complete   HRI or Home Care Consult Complete   SW  Recovery Care/Counseling Consult Complete   Palliative Care Screening Not Applicable   Skilled Nursing Facility Complete

## 2023-07-26 ENCOUNTER — Telehealth: Payer: Self-pay | Admitting: Family

## 2023-07-26 NOTE — Telephone Encounter (Signed)
 Pt confirmed appt for 07/27/23

## 2023-07-26 NOTE — Progress Notes (Unsigned)
 Advanced Heart Failure Clinic Note   Referring Physician: recent admission PCP: Pcp, No Cardiologist: Lorine Bears, MD / Nicolasa Ducking, NP (last seen 03/23)  Chief Complaint: fatigue  HPI:  Darren Rogers is a 68 y/o male with a history of persistent atrial fibrillation, nonobstructive CAD, hypertension, hyperlipidemia, noncompliance, and polysubstance abuse, hyponatremia, alcoholic liver cirrhosis and chronic heart failure.   Previously diagnosed with atrial fibrillation greater than 20 years ago and reports history of some type of ablation at [facility]. He had recurrent atrial fibrillation in August 2015, and in that setting, experience chest pain. He underwent diagnostic catheterization which showed mild, nonobstructive CAD. A-fib was previously managed with flecainide and Xarelto however, compliance was poor.   January 2017, he had recurrent chest pain and atrial fibrillation and underwent stress testing which was nonischemic. He also reported lower extremity claudication and underwent ABIs, which were abnormal with suggestion of mild to moderate bilateral SFA and right profunda stenoses. Recurrent atrial fibrillation in August 2018 in the setting of noncompliance and was resumed on metoprolol, flecainide, and Xarelto however, was lost to follow-up.   Multiple admissions in 2022 in the setting of rapid atrial fibrillation and atypical chest pain in the setting of noncompliance. It was felt that A-fib was likely transitioning to permanent and a ZIO monitor was placed at discharge. This subsequently showed 100% atrial fibrillation burden with an average heart rate of 92 bpm.Again, did not continue follow-up.  Continued to have multiple admissions during 2024 for alcohol induced pancreatitis or atrial fibrillation. Most recent admission was 07/10/23 with increased shortness of breath, pedal edema and malaise. Drinks 48 oz beer BID and not taking his medications. Found to have anasarca, chest x-ray  showed cardiomegaly with pulmonary edema, significant elevated BNP. Patient was started on V diuretics for CHF exacerbation & then transitioned to oral diuretics. Echocardiogram with EF of 30 to 35%. Developed AF RVR and was placed on amiodarone infusion & converted back to NSR. Converted to p.o. amiodarone, he should be taking 400 mg twice daily for next 5 days followed by 200 mg twice daily for 7 days and then 200 mg daily. Hyponatremia, hypokalemia and hypophosphatemia during hospitalization which were corrected before discharge.   He presents today for his initial visit with a chief complaint of minimal fatigue. Has associated back pain, leg pain, dizziness with sudden position changes and itching. He says that since he started some new medications, he's been itching "all over". No rash or hives that he's noticed. Reports sleeping well on 2 pillows. Denies shortness of breath, chest pain, palpitations, abdominal distention or pedal edema.   Has scales in a box but hasn't been using them. Doesn't add salt but does eat out often or eats TV dinners.   When reviewing medication bottles with patient, he's been taking a whole tablet of digoxin & spiro (supposed to be taking 1/2 tablet of each) and his BID meds, he sometimes takes them just in the morning. Has been taking apixaban BID but does alter the time of day he takes them. When asked if there was a reason for this, he just says "no I just don't think about it"  Smoking 1/2 ppd cigarettes, was smoking 1 ppd. No alcohol for last 2-3 weeks and no drug use.    Review of Systems: [y] = yes, [ ]  = no   General: Weight gain [ ] ; Weight loss [ ] ; Anorexia [ ] ; Fatigue Cove.Darren Rogers ]; Fever [ ] ; Chills [ ] ; Weakness [ ]   Cardiac: Chest pain/pressure [ ] ; Resting SOB [ ] ; Exertional SOB [ ] ; Orthopnea [ ] ; Pedal Edema [ ] ; Palpitations [ ] ; Syncope [ ] ; Presyncope [ ] ; Dizziness Cove.Darren Rogers ]  Pulmonary: Cough [ ] ; Wheezing[ ] ; Hemoptysis[ ] ; Sputum [ ] ; Snoring [ ]   GI:  Vomiting[ ] ; Dysphagia[ ] ; Melena[ ] ; Hematochezia [ ] ; Heartburn[ ] ; Abdominal pain [ ] ; Constipation [ ] ; Diarrhea [ ] ; BRBPR [ ]   GU: Hematuria[ ] ; Dysuria [ ] ; Nocturia[ ]   Vascular: Pain in legs with walking [ ] ; Pain in feet with lying flat [ ] ; Non-healing sores [ ] ; Stroke [ ] ; TIA [ ] ; Slurred speech [ ] ;  Neuro: Headaches[ ] ; Vertigo[ ] ; Seizures[ ] ; Paresthesias[ ] ;Blurred vision [ ] ; Diplopia [ ] ; Vision changes [ ]   Ortho/Skin: Arthritis [ ] ; Leg pain Cove.Darren Rogers ]; Muscle pain [ ] ; Joint swelling [ ] ; Back Pain Cove.Darren Rogers ]; Itching Cove.Darren Rogers ]  Psych: Depression[ ] ; Anxiety[ ]   Heme: Bleeding problems [ ] ; Clotting disorders [ ] ; Anemia [ ]   Endocrine: Diabetes [ ] ; Thyroid dysfunction[ ]    Past Medical History:  Diagnosis Date   Chronic HFrEF (heart failure with reduced ejection fraction) (HCC)    a. 03/29/2014: EF 55-60%, mild LVH, normal RVSP;  b. 05/2015 Echo: EF 60-65%, triv AI, mild Darren; c. 03/2021 Echo: EF 55-60%, no rwma, mild LVH, nl RV fxn, mildly dil LA, mild Darren; d. 11/2022 Echo: EF 35-40%, mild LVH, nl RV fxn, RVSP 53.19mmhg. Mild BAE. Mild to mod Darren/TR/PR. Mild AI. Ao root 42mm.   Claudication (HCC)    a. 06/2015 ABI: R - 0.73, L - 0.73. 30-49% bilat SFA stenosis. 50-74% R Profunda stenosis; b. 01/2017 ABI: R 0.89, L 0.88, TBI R 0.65, L 1.0.   Erectile dysfunction    Essential hypertension    Hyperlipidemia    Non-obstructive CAD    a. 01/27/2014 Cath: LM nl, LAD mild diff dz w/o obs, LCX no sig obs - scattered 20-30% mLCx, RCA no obs dz, EF 70%; b. 06/2015 MV; EF 60%, no ischemia; c. 07/2020 MV: No ischemia/scar; d. 11/2022 Cath: LM nl, LAD 35ost/p, LCX 20d, LPDA fills from dLAD, LPAV 100 CTO, RCA small, nl.   Persistent atrial fibrillation (HCC)    a. Pt says Dx >82yrs ago w/ ? RFCA @ [facility];  b. Recurrent 12/2013;  b. Rx Flecainide and Xarelto-->Intermittent compliance; c. 03/2021 Zio: 100% Afib w/ avg rate of 99 bpm. Rare PVCs.   PVC's (premature ventricular contractions)    a. 05/2017 Zio: 4000  PVCs in 48 hrs (2%). Brief run of SVT.   Tobacco abuse    a. ongoing - 1 ppd.    Current Outpatient Medications  Medication Sig Dispense Refill   amiodarone (PACERONE) 200 MG tablet Take 2 tablets (400 mg total) by mouth 2 (two) times daily for 5 days, THEN 1 tablet (200 mg total) 2 (two) times daily for 7 days, THEN 1 tablet (200 mg total) daily. 112 tablet 0   apixaban (ELIQUIS) 5 MG TABS tablet Take 1 tablet (5 mg total) by mouth 2 (two) times daily. 180 tablet 3   atorvastatin (LIPITOR) 40 MG tablet Take 1 tablet (40 mg total) by mouth daily. 90 tablet 1   dapagliflozin propanediol (FARXIGA) 10 MG TABS tablet Take 1 tablet (10 mg total) by mouth daily. 90 tablet 2   digoxin (LANOXIN) 0.125 MG tablet Take 0.5 tablets (0.0625 mg total) by mouth daily. 45 tablet 1   folic acid (FOLVITE)  1 MG tablet Take 1 tablet (1 mg total) by mouth daily. 90 tablet 1   furosemide (LASIX) 40 MG tablet Take 1 tablet (40 mg total) by mouth 2 (two) times daily. 180 tablet 2   metoprolol succinate (TOPROL-XL) 25 MG 24 hr tablet Take 1 tablet (25 mg total) by mouth 2 (two) times daily. 180 tablet 2   Multiple Vitamin (MULTIVITAMIN WITH MINERALS) TABS tablet Take 1 tablet by mouth daily. (Patient not taking: Reported on 07/10/2023)     nicotine (NICODERM CQ - DOSED IN MG/24 HOURS) 21 mg/24hr patch Place 1 patch (21 mg total) onto the skin daily. (Patient not taking: Reported on 07/10/2023) 28 patch 0   nitroGLYCERIN (NITROSTAT) 0.4 MG SL tablet Place 1 tablet (0.4 mg total) under the tongue every 5 (five) minutes x 3 doses as needed for chest pain. 30 tablet 0   pantoprazole (PROTONIX) 40 MG tablet Take 1 tablet (40 mg total) by mouth daily. 90 tablet 2   polyethylene glycol powder (GLYCOLAX/MIRALAX) 17 GM/SCOOP powder Take 17 g by mouth daily as needed for mild constipation. Mix as directed. 238 g 0   QUEtiapine (SEROQUEL) 25 MG tablet Take 1 tablet (25 mg total) by mouth at bedtime. 90 tablet 1   spironolactone  (ALDACTONE) 25 MG tablet Take 0.5 tablets (12.5 mg total) by mouth daily. 45 tablet 3   thiamine (VITAMIN B-1) 100 MG tablet Take 1 tablet (100 mg total) by mouth daily. 90 tablet 1   No current facility-administered medications for this visit.    No Known Allergies    Social History   Socioeconomic History   Marital status: Single    Spouse name: Not on file   Number of children: Not on file   Years of education: Not on file   Highest education level: 12th grade  Occupational History   Not on file  Tobacco Use   Smoking status: Every Day    Current packs/day: 0.50    Average packs/day: 0.5 packs/day for 30.0 years (15.0 ttl pk-yrs)    Types: Cigarettes   Smokeless tobacco: Never  Vaping Use   Vaping status: Never Used  Substance and Sexual Activity   Alcohol use: Yes    Comment: 4 (40 ounce beers a day)   Drug use: Not Currently    Types: Cocaine   Sexual activity: Not on file  Other Topics Concern   Not on file  Social History Narrative   The patient lives in [place] with his sister. He works in a substance abuse program. He is a long-time smoker, one pack per day. ETOH use 4 ( 40oz beers a day).No illicit drugs. He is not married.   Social Drivers of Corporate investment banker Strain: Low Risk  (07/13/2023)   Overall Financial Resource Strain (CARDIA)    Difficulty of Paying Living Expenses: Not very hard  Food Insecurity: No Food Insecurity (07/13/2023)   Hunger Vital Sign    Worried About Running Out of Food in the Last Year: Never true    Ran Out of Food in the Last Year: Never true  Transportation Needs: Unmet Transportation Needs (07/13/2023)   PRAPARE - Administrator, Civil Service (Medical): Yes    Lack of Transportation (Non-Medical): Yes  Physical Activity: Not on file  Stress: Not on file  Social Connections: Moderately Isolated (07/11/2023)   Social Connection and Isolation Panel [NHANES]    Frequency of Communication with  Friends and Family: More  than three times a week    Frequency of Social Gatherings with Friends and Family: Three times a week    Attends Religious Services: More than 4 times per year    Active Member of Clubs or Organizations: No    Attends Banker Meetings: Never    Marital Status: Never married  Intimate Partner Violence: Not At Risk (07/11/2023)   Humiliation, Afraid, Rape, and Kick questionnaire    Fear of Current or Ex-Partner: No    Emotionally Abused: No    Physically Abused: No    Sexually Abused: No      Family History  Problem Relation Age of Onset   Coronary artery disease Father 23   Diabetes Mother    Diabetes Sister    Diabetes Brother    Diabetes Maternal Grandmother    Diabetes Sister    Diabetes Sister    Vitals:   07/27/23 1423  BP: 130/72  Pulse: 66  Weight: 145 lb 6.4 oz (66 kg)   Wt Readings from Last 3 Encounters:  07/27/23 145 lb 6.4 oz (66 kg)  07/23/23 137 lb 12.6 oz (62.5 kg)  03/24/23 148 lb 5.9 oz (67.3 kg)   Lab Results  Component Value Date   CREATININE 1.01 07/22/2023   CREATININE 1.12 07/21/2023   CREATININE 0.78 07/19/2023   PHYSICAL EXAM:  General: Well appearing. No resp difficulty HEENT: normal Neck: supple, no JVD Cor: Regular rhythm, rate. No rubs, gallops or murmurs Lungs: clear Abdomen: soft, nontender, nondistended. Extremities: no cyanosis, clubbing, rash, edema Neuro: alert & oriented X 3. Moves all 4 extremities w/o difficulty. Affect pleasant   ECG: 07/18/23: Atrial flutter with 2:1 AV conduction, HR 138   ASSESSMENT & PLAN:  1: Chronic heart failure with reduced ejection fraction- - suspect due to alcohol use/ atrial fibrillation - NYHA class II - euvolemic - not weighing daily but has scales; reviewed importance of weighing daily and parameters to call about - Echo 07/13/23: EF 30-35%, RV moderately reduced, mildly elevated PA pressure, RA moderately dilated, moderate Darren, severe TR, mild AR -  continue farxiga 10mg  daily - decrease digoxin to 0.0625mg  daily (was inadvertently taking 0.125mg  daily) - continue furosemide 40mg  BID - continue metoprolol succinate 25mg  BID - decrease spironolactone to 12.5mg  daily (was inadvertently taking 25mg  daily) - consider entresto if BID dosing will work for him - pill box provided today along with color coded medication list - stop seroquel, thiamine and folic acid - encouraged to make lowest sodium options when eating out - BMET, CBC, dig level today - BNP 07/17/23 reviewed and was 585.4  2: HTN- - BP 130/72 - no PCP; he prefers [facility] as it's close to his house - RN called and got him an appointment scheduled for 08/18/23 - BMET 07/22/23 reviewed and showed sodium 135, potassium 3.5, creatinine 1.01 & GFR >60  3: Atrial fibrillation- - [facility] card referral placed today - continue amiodarone 200mg  daily (confused about taper) - continue apixaban 5mg  BID (emphasized taking this consistent times)  4: Polysubstance use- - smokes 1/2 ppd cigarettes - no alcohol for last 2-3 weeks - denies drug use - cessation discussed  5: Nonobstructive CAD- - continue atorvastatin 40mg  daily - LDL 03/22/23 reviewed and was 106 - lipo (a) 12/21/22 was 235.2 - R/LHC 12/23/22: Severe single-vessel coronary artery disease with chronic total occlusion of distal LCx/LPLA.  LPDA is supplied by left-to-left collaterals.  There is mild-moderate, non-obstructive disease involving the LAD. Upper normal to  mildly elevated left and right heart filling pressures (LVEDP 17 mmHg, PCWP 18 mmHg, RA 7 mmHg). Moderate pulmonary hypertension (mean PA 37 mmHg, PVR 5 WU) Mildly reduced Fick cardiac output/index (CO 3.8 L/min, CI 2.1 L/min/m^2).   Itching could be related to seroquel or thiamine which are being stopped today.   Return here in 3 weeks, sooner if needed.   Delma Freeze, FNP 07/26/23

## 2023-07-27 ENCOUNTER — Encounter: Payer: Self-pay | Admitting: Family

## 2023-07-27 ENCOUNTER — Ambulatory Visit: Payer: 59 | Attending: Family | Admitting: Family

## 2023-07-27 VITALS — BP 130/72 | HR 66 | Wt 145.4 lb

## 2023-07-27 DIAGNOSIS — I110 Hypertensive heart disease with heart failure: Secondary | ICD-10-CM | POA: Insufficient documentation

## 2023-07-27 DIAGNOSIS — L299 Pruritus, unspecified: Secondary | ICD-10-CM | POA: Insufficient documentation

## 2023-07-27 DIAGNOSIS — Z7901 Long term (current) use of anticoagulants: Secondary | ICD-10-CM | POA: Insufficient documentation

## 2023-07-27 DIAGNOSIS — I480 Paroxysmal atrial fibrillation: Secondary | ICD-10-CM | POA: Diagnosis not present

## 2023-07-27 DIAGNOSIS — I5022 Chronic systolic (congestive) heart failure: Secondary | ICD-10-CM | POA: Insufficient documentation

## 2023-07-27 DIAGNOSIS — K7030 Alcoholic cirrhosis of liver without ascites: Secondary | ICD-10-CM | POA: Insufficient documentation

## 2023-07-27 DIAGNOSIS — F17210 Nicotine dependence, cigarettes, uncomplicated: Secondary | ICD-10-CM | POA: Insufficient documentation

## 2023-07-27 DIAGNOSIS — E785 Hyperlipidemia, unspecified: Secondary | ICD-10-CM | POA: Insufficient documentation

## 2023-07-27 DIAGNOSIS — I272 Pulmonary hypertension, unspecified: Secondary | ICD-10-CM | POA: Diagnosis not present

## 2023-07-27 DIAGNOSIS — F1910 Other psychoactive substance abuse, uncomplicated: Secondary | ICD-10-CM

## 2023-07-27 DIAGNOSIS — M549 Dorsalgia, unspecified: Secondary | ICD-10-CM | POA: Diagnosis not present

## 2023-07-27 DIAGNOSIS — R5383 Other fatigue: Secondary | ICD-10-CM | POA: Diagnosis present

## 2023-07-27 DIAGNOSIS — Z79899 Other long term (current) drug therapy: Secondary | ICD-10-CM | POA: Diagnosis not present

## 2023-07-27 DIAGNOSIS — Z7984 Long term (current) use of oral hypoglycemic drugs: Secondary | ICD-10-CM | POA: Diagnosis not present

## 2023-07-27 DIAGNOSIS — I2510 Atherosclerotic heart disease of native coronary artery without angina pectoris: Secondary | ICD-10-CM | POA: Diagnosis not present

## 2023-07-27 DIAGNOSIS — Z91148 Patient's other noncompliance with medication regimen for other reason: Secondary | ICD-10-CM | POA: Insufficient documentation

## 2023-07-27 DIAGNOSIS — I10 Essential (primary) hypertension: Secondary | ICD-10-CM

## 2023-07-27 NOTE — Patient Instructions (Addendum)
 Medication Changes:  STOP THIAMINE   STOP SEROQUEL   STOP FOLIC ACID   PLEASE FOLLOW MEDICATION LIST, DOSAGES, AND WHEN TO TAKE   Lab Work:  Go DOWN to LOWER LEVEL (LL) to have your blood work completed inside of [employer] office.  We will only call you if the results are abnormal or if the provider would like to make medication changes.  YOU NEED TO WEIGH YOURSELF EVERY MORNING AFTER USING THE RESTROOM- RECORD THIS WEIGHT AND WRITE DOWN ON LOG GIVEN. PLEASE TAKE THIS TO NEXT APPOINTMENT   Special Instructions // Education:  APPOINTMENT SCHEDULED FOR THE [facility] ON [facility] MARCH [facility]TH AT 1:20PM   ADDRESS [address] [place] [address]  PLEASE BRING INSURANCE CARD, ID, MEDICATIONS, AND UTILITY BILL TO PROVE ADDRESS   YOU MUST CALL AND CONFIRM YOUR APPOINTMENT WITH THEM 24 HOURS IN ADVANCE- PLEASE CALL THIS NUMBER [phone]  Follow-Up in: 3 WEEKS AS SCHEDULED   MESSAGE SENT TO HEART CARE IN [place] THEY WILL CALL TO GET YOU SCHEDULED FOR A FOLLOW UP APPOINTMENT   If you have any questions or concerns before your next appointment please send [place] a message through mychart or call our office at [phone] Monday-Friday 8 am-5 pm.   If you have an urgent need after hours on the weekend please call your Primary Cardiologist or the [facility] in [place] at [phone].   At the [facility], you and your health needs are our priority. We have a designated team specialized in the treatment of Heart Failure. This Care Team includes your primary Heart Failure Specialized Cardiologist (physician), Advanced Practice Providers (APPs- Physician Assistants and Nurse Practitioners), and Pharmacist who all work together to provide you with the care you need, when you need it.   You may see any of the following providers on your designated Care Team at your next follow up:  Dr. Arvilla Meres Dr. Marca Ancona Dr. Dorthula Nettles Dr. Theresia Bough Tonye Becket, NP Robbie Lis, [place] [address] [place], [place] Brynda Peon, NP [place] Lee, NP Clarisa Kindred, NP Enos Fling, PharmD

## 2023-07-28 ENCOUNTER — Encounter: Payer: 59 | Admitting: Family

## 2023-07-28 ENCOUNTER — Other Ambulatory Visit (HOSPITAL_COMMUNITY): Payer: Self-pay

## 2023-07-28 LAB — BASIC METABOLIC PANEL
BUN/Creatinine Ratio: 12 (ref 10–24)
BUN: 10 mg/dL (ref 8–27)
CO2: 25 mmol/L (ref 20–29)
Calcium: 9.7 mg/dL (ref 8.6–10.2)
Chloride: 99 mmol/L (ref 96–106)
Creatinine, Ser: 0.86 mg/dL (ref 0.76–1.27)
Glucose: 85 mg/dL (ref 70–99)
Potassium: 4.3 mmol/L (ref 3.5–5.2)
Sodium: 140 mmol/L (ref 134–144)
eGFR: 94 mL/min/{1.73_m2} (ref >59)

## 2023-07-28 LAB — BRAIN NATRIURETIC PEPTIDE: BNP: 252.9 pg/mL — ABNORMAL HIGH (ref 0.0–100.0)

## 2023-07-28 LAB — DIGOXIN LEVEL: Digoxin, Serum: 0.9 ng/mL (ref 0.5–0.9)

## 2023-07-29 ENCOUNTER — Telehealth (HOSPITAL_COMMUNITY): Payer: Self-pay | Admitting: Licensed Clinical Social Worker

## 2023-07-29 NOTE — Telephone Encounter (Signed)
 CSW received referral to assist patient with navigating his healthcare needs. CSW attempted to contact patient although message left for return call. CSW discussed possible home care referral with RN and NP to assist with disease management and medication adherence. CSW will discuss further with patient when contact made. Lasandra Beech, LCSW, CCSW-MCS [phone]

## 2023-07-30 ENCOUNTER — Telehealth (HOSPITAL_COMMUNITY): Payer: Self-pay | Admitting: Licensed Clinical Social Worker

## 2023-07-30 NOTE — Telephone Encounter (Signed)
 CSW attempted again to reach patient to offer support and resources. No answer and message left for return call. Lasandra Beech, LCSW, CCSW-MCS [phone]

## 2023-08-03 ENCOUNTER — Telehealth: Payer: Self-pay | Admitting: Family

## 2023-08-03 ENCOUNTER — Ambulatory Visit: Payer: 59 | Attending: Medical | Admitting: Medical

## 2023-08-03 NOTE — Progress Notes (Deleted)
  Cardiology Office Note:  .   Date:  08/03/2023  ID:  Darren Rogers, DOB [birth year], MRN [id number] PCP: Pcp, No   HeartCare Providers Cardiologist:  Lorine Bears, MD { Click to update primary MD,subspecialty MD or APP then REFRESH:1}   History of Present Illness: .   Darren Rogers is a 68 y.o. male with a h/o medication noncompliance, CAD, chronic Afib on Eliquis and digoxzin, HTN, HLD, chronic HFrEF with LVEF 35-40%, frequent PVCs, alcohol abuse, and recurrent alcohol pancreatitis who is being seen for hospital follow-up.     ROS: ***  Studies Reviewed: .        *** Risk Assessment/Calculations:   {Does this patient have ATRIAL FIBRILLATION?:[phone]} No BP recorded.  {Refresh Note OR Click here to enter BP  :1}***       Physical Exam:   VS:  There were no vitals taken for this visit.   Wt Readings from Last 3 Encounters:  07/27/23 145 lb 6.4 oz (66 kg)  07/23/23 137 lb 12.6 oz (62.5 kg)  03/24/23 148 lb 5.9 oz (67.3 kg)    GEN: Well nourished, well developed in no acute distress NECK: No JVD; No carotid bruits CARDIAC: ***RRR, no murmurs, rubs, gallops RESPIRATORY:  Clear to auscultation without rales, wheezing or rhonchi  ABDOMEN: Soft, non-tender, non-distended EXTREMITIES:  No edema; No deformity   ASSESSMENT AND PLAN: .   ***    {Are you ordering a CV Procedure (e.g. stress test, cath, DCCV, TEE, etc)?   Press F2        :[id number]}  Dispo: ***  Signed, Mckynna Vanloan David Stall, PA-C

## 2023-08-10 ENCOUNTER — Telehealth: Payer: Self-pay | Admitting: Family

## 2023-08-10 NOTE — Progress Notes (Deleted)
 [facility] Note   Referring Physician: recent admission PCP: Pcp, No Cardiologist: Lorine Bears, MD / Nicolasa Ducking, NP (last seen 03/23)  Chief Complaint: fatigue  HPI:  [name] is a 68 y/o male with a history of persistent atrial fibrillation, nonobstructive CAD, hypertension, hyperlipidemia, noncompliance, and polysubstance abuse, hyponatremia, alcoholic liver cirrhosis and chronic heart failure.   Previously diagnosed with atrial fibrillation greater than 20 years ago and reports history of some type of ablation at [facility]. He had recurrent atrial fibrillation in August 2015, and in that setting, experience chest pain. He underwent diagnostic catheterization which showed mild, nonobstructive CAD. A-fib was previously managed with flecainide and Xarelto however, compliance was poor.   January 2017, he had recurrent chest pain and atrial fibrillation and underwent stress testing which was nonischemic. He also reported lower extremity claudication and underwent ABIs, which were abnormal with suggestion of mild to moderate bilateral SFA and right profunda stenoses. Recurrent atrial fibrillation in August 2018 in the setting of noncompliance and was resumed on metoprolol, flecainide, and Xarelto however, was lost to follow-up.   Multiple admissions in 2022 in the setting of rapid atrial fibrillation and atypical chest pain in the setting of noncompliance. It was felt that A-fib was likely transitioning to permanent and a ZIO monitor was placed at discharge. This subsequently showed 100% atrial fibrillation burden with an average heart rate of 92 bpm.Again, did not continue follow-up.  Continued to have multiple admissions during 2024 for alcohol induced pancreatitis or atrial fibrillation. Most recent admission was 07/10/23 with increased shortness of breath, pedal edema and malaise. Drinks 48 oz beer BID and not taking his medications. Found to have anasarca, chest x-ray  showed cardiomegaly with pulmonary edema, significant elevated BNP. Patient was started on V diuretics for CHF exacerbation & then transitioned to oral diuretics. Echocardiogram with EF of 30 to 35%. Developed AF RVR and was placed on amiodarone infusion & converted back to NSR. Converted to p.o. amiodarone, he should be taking 400 mg twice daily for next 5 days followed by 200 mg twice daily for 7 days and then 200 mg daily. Hyponatremia, hypokalemia and hypophosphatemia during hospitalization which were corrected before discharge.   He presents today for his initial visit with a chief complaint of minimal fatigue. Has associated back pain, leg pain, dizziness with sudden position changes and itching. He says that since he started some new medications, he's been itching "all over". No rash or hives that he's noticed. Reports sleeping well on 2 pillows. Denies shortness of breath, chest pain, palpitations, abdominal distention or pedal edema.   Has scales in a box but hasn't been using them. Doesn't add salt but does eat out often or eats TV dinners.   When reviewing medication bottles with patient, he's been taking a whole tablet of digoxin & spiro (supposed to be taking 1/2 tablet of each) and his BID meds, he sometimes takes them just in the morning. Has been taking apixaban BID but does alter the time of day he takes them. When asked if there was a reason for this, he just says "no I just don't think about it"  Smoking 1/2 ppd cigarettes, was smoking 1 ppd. No alcohol for last 2-3 weeks and no drug use.    Review of Systems: [y] = yes, [ ]  = no   General: Weight gain [ ] ; Weight loss [ ] ; Anorexia [ ] ; Fatigue Cove.Etienne ]; Fever [ ] ; Chills [ ] ; Weakness [ ]   Cardiac: Chest pain/pressure [ ] ; Resting SOB [ ] ; Exertional SOB [ ] ; Orthopnea [ ] ; Pedal Edema [ ] ; Palpitations [ ] ; Syncope [ ] ; Presyncope [ ] ; Dizziness Cove.Etienne ]  Pulmonary: Cough [ ] ; Wheezing[ ] ; Hemoptysis[ ] ; Sputum [ ] ; Snoring [ ]   GI:  Vomiting[ ] ; Dysphagia[ ] ; Melena[ ] ; Hematochezia [ ] ; Heartburn[ ] ; Abdominal pain [ ] ; Constipation [ ] ; Diarrhea [ ] ; BRBPR [ ]   GU: Hematuria[ ] ; Dysuria [ ] ; Nocturia[ ]   Vascular: Pain in legs with walking [ ] ; Pain in feet with lying flat [ ] ; Non-healing sores [ ] ; Stroke [ ] ; TIA [ ] ; Slurred speech [ ] ;  Neuro: Headaches[ ] ; Vertigo[ ] ; Seizures[ ] ; Paresthesias[ ] ;Blurred vision [ ] ; Diplopia [ ] ; Vision changes [ ]   Ortho/Skin: Arthritis [ ] ; Leg pain Cove.Etienne ]; Muscle pain [ ] ; Joint swelling [ ] ; Back Pain Cove.Etienne ]; Itching Cove.Etienne ]  Psych: Depression[ ] ; Anxiety[ ]   Heme: Bleeding problems [ ] ; Clotting disorders [ ] ; Anemia [ ]   Endocrine: Diabetes [ ] ; Thyroid dysfunction[ ]    Past Medical History:  Diagnosis Date   Chronic HFrEF (heart failure with reduced ejection fraction) (HCC)    a. 03/29/2014: EF 55-60%, mild LVH, normal RVSP;  b. 05/2015 Echo: EF 60-65%, triv AI, mild MR; c. 03/2021 Echo: EF 55-60%, no rwma, mild LVH, nl RV fxn, mildly dil LA, mild MR; d. 11/2022 Echo: EF 35-40%, mild LVH, nl RV fxn, RVSP 53.43mmhg. Mild BAE. Mild to mod MR/TR/PR. Mild AI. Ao root 42mm.   Claudication (HCC)    a. 06/2015 ABI: R - 0.73, L - 0.73. 30-49% bilat SFA stenosis. 50-74% R Profunda stenosis; b. 01/2017 ABI: R 0.89, L 0.88, TBI R 0.65, L 1.0.   Erectile dysfunction    Essential hypertension    Hyperlipidemia    Non-obstructive CAD    a. 01/27/2014 Cath: LM nl, LAD mild diff dz w/o obs, LCX no sig obs - scattered 20-30% mLCx, RCA no obs dz, EF 70%; b. 06/2015 MV; EF 60%, no ischemia; c. 07/2020 MV: No ischemia/scar; d. 11/2022 Cath: LM nl, LAD 35ost/p, LCX 20d, LPDA fills from dLAD, LPAV 100 CTO, RCA small, nl.   Persistent atrial fibrillation (HCC)    a. Pt says Dx >73yrs ago w/ ? RFCA @ [facility];  b. Recurrent 12/2013;  b. Rx Flecainide and Xarelto-->Intermittent compliance; c. 03/2021 Zio: 100% Afib w/ avg rate of 99 bpm. Rare PVCs.   PVC's (premature ventricular contractions)    a. 05/2017 Zio: 4000  PVCs in 48 hrs (2%). Brief run of SVT.   Tobacco abuse    a. ongoing - 1 ppd.    Current Outpatient Medications  Medication Sig Dispense Refill   amiodarone (PACERONE) 200 MG tablet Take 2 tablets (400 mg total) by mouth 2 (two) times daily for 5 days, THEN 1 tablet (200 mg total) 2 (two) times daily for 7 days, THEN 1 tablet (200 mg total) daily. 112 tablet 0   apixaban (ELIQUIS) 5 MG TABS tablet Take 1 tablet (5 mg total) by mouth 2 (two) times daily. 180 tablet 3   atorvastatin (LIPITOR) 40 MG tablet Take 1 tablet (40 mg total) by mouth daily. 90 tablet 1   dapagliflozin propanediol (FARXIGA) 10 MG TABS tablet Take 1 tablet (10 mg total) by mouth daily. 90 tablet 2   digoxin (LANOXIN) 0.125 MG tablet Take 0.5 tablets (0.0625 mg total) by mouth daily. 45 tablet 1   furosemide (LASIX) 40  MG tablet Take 1 tablet (40 mg total) by mouth 2 (two) times daily. 180 tablet 2   metoprolol succinate (TOPROL-XL) 25 MG 24 hr tablet Take 1 tablet (25 mg total) by mouth 2 (two) times daily. 180 tablet 2   Multiple Vitamin (MULTIVITAMIN WITH MINERALS) TABS tablet Take 1 tablet by mouth daily.     nicotine (NICODERM CQ - DOSED IN MG/24 HOURS) 21 mg/24hr patch Place 1 patch (21 mg total) onto the skin daily. (Patient not taking: Reported on 07/27/2023) 28 patch 0   nitroGLYCERIN (NITROSTAT) 0.4 MG SL tablet Place 1 tablet (0.4 mg total) under the tongue every 5 (five) minutes x 3 doses as needed for chest pain. 30 tablet 0   pantoprazole (PROTONIX) 40 MG tablet Take 1 tablet (40 mg total) by mouth daily. 90 tablet 2   polyethylene glycol powder (GLYCOLAX/MIRALAX) 17 GM/SCOOP powder Take 17 g by mouth daily as needed for mild constipation. Mix as directed. (Patient not taking: Reported on 07/27/2023) 238 g 0   spironolactone (ALDACTONE) 25 MG tablet Take 0.5 tablets (12.5 mg total) by mouth daily. 45 tablet 3   No current facility-administered medications for this visit.    No Known Allergies    Social  History   Socioeconomic History   Marital status: Single    Spouse name: Not on file   Number of children: Not on file   Years of education: Not on file   Highest education level: 12th grade  Occupational History   Not on file  Tobacco Use   Smoking status: Every Day    Current packs/day: 0.50    Average packs/day: 0.5 packs/day for 30.0 years (15.0 ttl pk-yrs)    Types: Cigarettes   Smokeless tobacco: Never  Vaping Use   Vaping status: Never Used  Substance and Sexual Activity   Alcohol use: Yes    Comment: 4 (40 ounce beers a day)   Drug use: Not Currently    Types: Cocaine   Sexual activity: Not on file  Other Topics Concern   Not on file  Social History Narrative   The patient lives in [place] with his sister. He works in a substance abuse program. He is a long-time smoker, one pack per day. ETOH use 4 ( 40oz beers a day).No illicit drugs. He is not married.   Social Drivers of Corporate investment banker Strain: Low Risk  (07/13/2023)   Overall Financial Resource Strain (CARDIA)    Difficulty of Paying Living Expenses: Not very hard  Food Insecurity: No Food Insecurity (07/13/2023)   Hunger Vital Sign    Worried About Running Out of Food in the Last Year: Never true    Ran Out of Food in the Last Year: Never true  Transportation Needs: Unmet Transportation Needs (07/13/2023)   PRAPARE - Administrator, Civil Service (Medical): Yes    Lack of Transportation (Non-Medical): Yes  Physical Activity: Not on file  Stress: Not on file  Social Connections: Moderately Isolated (07/11/2023)   Social Connection and Isolation Panel [NHANES]    Frequency of Communication with Friends and Family: More than three times a week    Frequency of Social Gatherings with Friends and Family: Three times a week    Attends Religious Services: More than 4 times per year    Active Member of Clubs or Organizations: No    Attends Banker Meetings: Never     Marital Status: Never married  Intimate  Partner Violence: Not At Risk (07/11/2023)   Humiliation, Afraid, Rape, and Kick questionnaire    Fear of Current or Ex-Partner: No    Emotionally Abused: No    Physically Abused: No    Sexually Abused: No      Family History  Problem Relation Age of Onset   Coronary artery disease Father 77   Diabetes Mother    Diabetes Sister    Diabetes Brother    Diabetes Maternal Grandmother    Diabetes Sister    Diabetes Sister    There were no vitals filed for this visit.  Wt Readings from Last 3 Encounters:  07/27/23 145 lb 6.4 oz (66 kg)  07/23/23 137 lb 12.6 oz (62.5 kg)  03/24/23 148 lb 5.9 oz (67.3 kg)   Lab Results  Component Value Date   CREATININE 0.86 07/27/2023   CREATININE 1.01 07/22/2023   CREATININE 1.12 07/21/2023   PHYSICAL EXAM:  General: Well appearing. No resp difficulty HEENT: normal Neck: supple, no JVD Cor: Regular rhythm, rate. No rubs, gallops or murmurs Lungs: clear Abdomen: soft, nontender, nondistended. Extremities: no cyanosis, clubbing, rash, edema Neuro: alert & oriented X 3. Moves all 4 extremities w/o difficulty. Affect pleasant   ECG: 07/18/23: Atrial flutter with 2:1 AV conduction, HR 138   ASSESSMENT & PLAN:  1: Chronic heart failure with reduced ejection fraction- - suspect due to alcohol use/ atrial fibrillation - NYHA class II - euvolemic - not weighing daily but has scales; reviewed importance of weighing daily and parameters to call about - Echo 07/13/23: EF 30-35%, RV moderately reduced, mildly elevated PA pressure, RA moderately dilated, moderate MR, severe TR, mild AR - continue farxiga 10mg  daily - decrease digoxin to 0.0625mg  daily (was inadvertently taking 0.125mg  daily) - continue furosemide 40mg  BID - continue metoprolol succinate 25mg  BID - decrease spironolactone to 12.5mg  daily (was inadvertently taking 25mg  daily) - consider entresto if BID dosing will work for him - pill box  provided today along with color coded medication list - stop seroquel, thiamine and folic acid - encouraged to make lowest sodium options when eating out - BMET, CBC, dig level today - BNP 07/17/23 reviewed and was 585.4  2: HTN- - BP 130/72 - no PCP; he prefers [facility] as it's close to his house - RN called and got him an appointment scheduled for 08/18/23 - BMET 07/22/23 reviewed and showed sodium 135, potassium 3.5, creatinine 1.01 & GFR >60  3: Atrial fibrillation- - [facility] card referral placed today - continue amiodarone 200mg  daily (confused about taper) - continue apixaban 5mg  BID (emphasized taking this consistent times)  4: Polysubstance use- - smokes 1/2 ppd cigarettes - no alcohol for last 2-3 weeks - denies drug use - cessation discussed  5: Nonobstructive CAD- - continue atorvastatin 40mg  daily - LDL 03/22/23 reviewed and was 106 - lipo (a) 12/21/22 was 235.2 - R/LHC 12/23/22: Severe single-vessel coronary artery disease with chronic total occlusion of distal LCx/LPLA.  LPDA is supplied by left-to-left collaterals.  There is mild-moderate, non-obstructive disease involving the LAD. Upper normal to mildly elevated left and right heart filling pressures (LVEDP 17 mmHg, PCWP 18 mmHg, RA 7 mmHg). Moderate pulmonary hypertension (mean PA 37 mmHg, PVR 5 WU) Mildly reduced Fick cardiac output/index (CO 3.8 L/min, CI 2.1 L/min/m^2).   Itching could be related to seroquel or thiamine which are being stopped today.   Return here in 3 weeks, sooner if needed.   Delma Freeze, FNP 08/10/23

## 2023-08-11 ENCOUNTER — Encounter: Admitting: Family

## 2023-08-11 NOTE — Telephone Encounter (Signed)
 Patient did not show for his Heart Failure Clinic appointment on 08/11/23.

## 2023-08-11 NOTE — Telephone Encounter (Signed)
 Opened in error

## 2023-08-17 ENCOUNTER — Encounter: Payer: 59 | Admitting: Family

## 2023-09-01 ENCOUNTER — Telehealth: Payer: Self-pay | Admitting: Family

## 2023-09-01 NOTE — Telephone Encounter (Signed)
 Pt called back. Was able to get pt reschedule for 09/07/23 at 3p

## 2023-09-01 NOTE — Telephone Encounter (Signed)
 Called to r/s cancelled appt 08/17/23

## 2023-09-06 ENCOUNTER — Telehealth: Payer: Self-pay | Admitting: Family

## 2023-09-06 NOTE — Progress Notes (Unsigned)
 [facility] Note   Referring Physician: recent admission PCP: Pcp, No Cardiologist: Lorine Bears, MD / Nicolasa Ducking, NP (last seen 03/23)  Chief Complaint: fatigue  HPI:  Darren Rogers is a 68 y/o male with a history of persistent atrial fibrillation, nonobstructive CAD, hypertension, hyperlipidemia, noncompliance, and polysubstance abuse, hyponatremia, alcoholic liver cirrhosis and chronic heart failure.   Previously diagnosed with atrial fibrillation greater than 20 years ago and reports history of some type of ablation at [facility]. He had recurrent atrial fibrillation in August 2015, and in that setting, experience chest pain. He underwent diagnostic catheterization which showed mild, nonobstructive CAD. A-fib was previously managed with flecainide and Xarelto however, compliance was poor.   January 2017, he had recurrent chest pain and atrial fibrillation and underwent stress testing which was nonischemic. He also reported lower extremity claudication and underwent ABIs, which were abnormal with suggestion of mild to moderate bilateral SFA and right profunda stenoses. Recurrent atrial fibrillation in August 2018 in the setting of noncompliance and was resumed on metoprolol, flecainide, and Xarelto however, was lost to follow-up.   Multiple admissions in 2022 in the setting of rapid atrial fibrillation and atypical chest pain in the setting of noncompliance. It was felt that A-fib was likely transitioning to permanent and a ZIO monitor was placed at discharge. This subsequently showed 100% atrial fibrillation burden with an average heart rate of 92 bpm.Again, did not continue follow-up.  Continued to have multiple admissions during 2024 for alcohol induced pancreatitis or atrial fibrillation. Most recent admission was 07/10/23 with increased shortness of breath, pedal edema and malaise. Drinks 48 oz beer BID and not taking his medications. Found to have anasarca, chest x-ray  showed cardiomegaly with pulmonary edema, significant elevated BNP. Patient was started on V diuretics for CHF exacerbation & then transitioned to oral diuretics. Echocardiogram with EF of 30 to 35%. Developed AF RVR and was placed on amiodarone infusion & converted back to NSR. Converted to p.o. amiodarone, he should be taking 400 mg twice daily for next 5 days followed by 200 mg twice daily for 7 days and then 200 mg daily. Hyponatremia, hypokalemia and hypophosphatemia during hospitalization which were corrected before discharge.   He presents today for his initial visit with a chief complaint of minimal fatigue. Has associated back pain, leg pain, dizziness with sudden position changes and itching. He says that since he started some new medications, he's been itching "all over". No rash or hives that he's noticed. Reports sleeping well on 2 pillows. Denies shortness of breath, chest pain, palpitations, abdominal distention or pedal edema.   Has scales in a box but hasn't been using them. Doesn't add salt but does eat out often or eats TV dinners.   When reviewing medication bottles with patient, he's been taking a whole tablet of digoxin & spiro (supposed to be taking 1/2 tablet of each) and his BID meds, he sometimes takes them just in the morning. Has been taking apixaban BID but does alter the time of day he takes them. When asked if there was a reason for this, he just says "no I just don't think about it"  Smoking 1/2 ppd cigarettes, was smoking 1 ppd. No alcohol for last 2-3 weeks and no drug use.    Review of Systems: [y] = yes, [ ]  = no   General: Weight gain [ ] ; Weight loss [ ] ; Anorexia [ ] ; Fatigue Cove.Etienne ]; Fever [ ] ; Chills [ ] ; Weakness [ ]   Cardiac: Chest pain/pressure [ ] ; Resting SOB [ ] ; Exertional SOB [ ] ; Orthopnea [ ] ; Pedal Edema [ ] ; Palpitations [ ] ; Syncope [ ] ; Presyncope [ ] ; Dizziness Cove.Etienne ]  Pulmonary: Cough [ ] ; Wheezing[ ] ; Hemoptysis[ ] ; Sputum [ ] ; Snoring [ ]   GI:  Vomiting[ ] ; Dysphagia[ ] ; Melena[ ] ; Hematochezia [ ] ; Heartburn[ ] ; Abdominal pain [ ] ; Constipation [ ] ; Diarrhea [ ] ; BRBPR [ ]   GU: Hematuria[ ] ; Dysuria [ ] ; Nocturia[ ]   Vascular: Pain in legs with walking [ ] ; Pain in feet with lying flat [ ] ; Non-healing sores [ ] ; Stroke [ ] ; TIA [ ] ; Slurred speech [ ] ;  Neuro: Headaches[ ] ; Vertigo[ ] ; Seizures[ ] ; Paresthesias[ ] ;Blurred vision [ ] ; Diplopia [ ] ; Vision changes [ ]   Ortho/Skin: Arthritis [ ] ; Leg pain Cove.Etienne ]; Muscle pain [ ] ; Joint swelling [ ] ; Back Pain Cove.Etienne ]; Itching Cove.Etienne ]  Psych: Depression[ ] ; Anxiety[ ]   Heme: Bleeding problems [ ] ; Clotting disorders [ ] ; Anemia [ ]   Endocrine: Diabetes [ ] ; Thyroid dysfunction[ ]    Past Medical History:  Diagnosis Date   Chronic HFrEF (heart failure with reduced ejection fraction) (HCC)    a. 03/29/2014: EF 55-60%, mild LVH, normal RVSP;  b. 05/2015 Echo: EF 60-65%, triv AI, mild Darren; c. 03/2021 Echo: EF 55-60%, no rwma, mild LVH, nl RV fxn, mildly dil LA, mild Darren; d. 11/2022 Echo: EF 35-40%, mild LVH, nl RV fxn, RVSP 53.12mmhg. Mild BAE. Mild to mod Darren/TR/PR. Mild AI. Ao root 42mm.   Claudication (HCC)    a. 06/2015 ABI: R - 0.73, L - 0.73. 30-49% bilat SFA stenosis. 50-74% R Profunda stenosis; b. 01/2017 ABI: R 0.89, L 0.88, TBI R 0.65, L 1.0.   Erectile dysfunction    Essential hypertension    Hyperlipidemia    Non-obstructive CAD    a. 01/27/2014 Cath: LM nl, LAD mild diff dz w/o obs, LCX no sig obs - scattered 20-30% mLCx, RCA no obs dz, EF 70%; b. 06/2015 MV; EF 60%, no ischemia; c. 07/2020 MV: No ischemia/scar; d. 11/2022 Cath: LM nl, LAD 35ost/p, LCX 20d, LPDA fills from dLAD, LPAV 100 CTO, RCA small, nl.   Persistent atrial fibrillation (HCC)    a. Pt says Dx >75yrs ago w/ ? RFCA @ [facility];  b. Recurrent 12/2013;  b. Rx Flecainide and Xarelto-->Intermittent compliance; c. 03/2021 Zio: 100% Afib w/ avg rate of 99 bpm. Rare PVCs.   PVC's (premature ventricular contractions)    a. 05/2017 Zio: 4000  PVCs in 48 hrs (2%). Brief run of SVT.   Tobacco abuse    a. ongoing - 1 ppd.    Current Outpatient Medications  Medication Sig Dispense Refill   amiodarone (PACERONE) 200 MG tablet Take 2 tablets (400 mg total) by mouth 2 (two) times daily for 5 days, THEN 1 tablet (200 mg total) 2 (two) times daily for 7 days, THEN 1 tablet (200 mg total) daily. 112 tablet 0   apixaban (ELIQUIS) 5 MG TABS tablet Take 1 tablet (5 mg total) by mouth 2 (two) times daily. 180 tablet 3   atorvastatin (LIPITOR) 40 MG tablet Take 1 tablet (40 mg total) by mouth daily. 90 tablet 1   dapagliflozin propanediol (FARXIGA) 10 MG TABS tablet Take 1 tablet (10 mg total) by mouth daily. 90 tablet 2   digoxin (LANOXIN) 0.125 MG tablet Take 0.5 tablets (0.0625 mg total) by mouth daily. 45 tablet 1   furosemide (LASIX) 40  MG tablet Take 1 tablet (40 mg total) by mouth 2 (two) times daily. 180 tablet 2   metoprolol succinate (TOPROL-XL) 25 MG 24 hr tablet Take 1 tablet (25 mg total) by mouth 2 (two) times daily. 180 tablet 2   Multiple Vitamin (MULTIVITAMIN WITH MINERALS) TABS tablet Take 1 tablet by mouth daily.     nicotine (NICODERM CQ - DOSED IN MG/24 HOURS) 21 mg/24hr patch Place 1 patch (21 mg total) onto the skin daily. (Patient not taking: Reported on 07/27/2023) 28 patch 0   nitroGLYCERIN (NITROSTAT) 0.4 MG SL tablet Place 1 tablet (0.4 mg total) under the tongue every 5 (five) minutes x 3 doses as needed for chest pain. 30 tablet 0   pantoprazole (PROTONIX) 40 MG tablet Take 1 tablet (40 mg total) by mouth daily. 90 tablet 2   polyethylene glycol powder (GLYCOLAX/MIRALAX) 17 GM/SCOOP powder Take 17 g by mouth daily as needed for mild constipation. Mix as directed. (Patient not taking: Reported on 07/27/2023) 238 g 0   spironolactone (ALDACTONE) 25 MG tablet Take 0.5 tablets (12.5 mg total) by mouth daily. 45 tablet 3   No current facility-administered medications for this visit.    No Known Allergies    Social  History   Socioeconomic History   Marital status: Single    Spouse name: Not on file   Number of children: Not on file   Years of education: Not on file   Highest education level: 12th grade  Occupational History   Not on file  Tobacco Use   Smoking status: Every Day    Current packs/day: 0.50    Average packs/day: 0.5 packs/day for 30.0 years (15.0 ttl pk-yrs)    Types: Cigarettes   Smokeless tobacco: Never  Vaping Use   Vaping status: Never Used  Substance and Sexual Activity   Alcohol use: Yes    Comment: 4 (40 ounce beers a day)   Drug use: Not Currently    Types: Cocaine   Sexual activity: Not on file  Other Topics Concern   Not on file  Social History Narrative   The patient lives in [place] with his sister. He works in a substance abuse program. He is a long-time smoker, one pack per day. ETOH use 4 ( 40oz beers a day).No illicit drugs. He is not married.   Social Drivers of Corporate investment banker Strain: Low Risk  (07/13/2023)   Overall Financial Resource Strain (CARDIA)    Difficulty of Paying Living Expenses: Not very hard  Food Insecurity: No Food Insecurity (07/13/2023)   Hunger Vital Sign    Worried About Running Out of Food in the Last Year: Never true    Ran Out of Food in the Last Year: Never true  Transportation Needs: Unmet Transportation Needs (07/13/2023)   PRAPARE - Administrator, Civil Service (Medical): Yes    Lack of Transportation (Non-Medical): Yes  Physical Activity: Not on file  Stress: Not on file  Social Connections: Moderately Isolated (07/11/2023)   Social Connection and Isolation Panel [NHANES]    Frequency of Communication with Friends and Family: More than three times a week    Frequency of Social Gatherings with Friends and Family: Three times a week    Attends Religious Services: More than 4 times per year    Active Member of Clubs or Organizations: No    Attends Banker Meetings: Never     Marital Status: Never married  Intimate  Partner Violence: Not At Risk (07/11/2023)   Humiliation, Afraid, Rape, and Kick questionnaire    Fear of Current or Ex-Partner: No    Emotionally Abused: No    Physically Abused: No    Sexually Abused: No      Family History  Problem Relation Age of Onset   Coronary artery disease Father 67   Diabetes Mother    Diabetes Sister    Diabetes Brother    Diabetes Maternal Grandmother    Diabetes Sister    Diabetes Sister    There were no vitals filed for this visit.  Wt Readings from Last 3 Encounters:  07/27/23 145 lb 6.4 oz (66 kg)  07/23/23 137 lb 12.6 oz (62.5 kg)  03/24/23 148 lb 5.9 oz (67.3 kg)   Lab Results  Component Value Date   CREATININE 0.86 07/27/2023   CREATININE 1.01 07/22/2023   CREATININE 1.12 07/21/2023   PHYSICAL EXAM:  General: Well appearing. No resp difficulty HEENT: normal Neck: supple, no JVD Cor: Regular rhythm, rate. No rubs, gallops or murmurs Lungs: clear Abdomen: soft, nontender, nondistended. Extremities: no cyanosis, clubbing, rash, edema Neuro: alert & oriented X 3. Moves all 4 extremities w/o difficulty. Affect pleasant   ECG: 07/18/23: Atrial flutter with 2:1 AV conduction, HR 138   ASSESSMENT & PLAN:  1: Chronic heart failure with reduced ejection fraction- - suspect due to alcohol use/ atrial fibrillation - NYHA class II - euvolemic - not weighing daily but has scales; reviewed importance of weighing daily and parameters to call about - Echo 07/13/23: EF 30-35%, RV moderately reduced, mildly elevated PA pressure, RA moderately dilated, moderate Darren, severe TR, mild AR - continue farxiga 10mg  daily - decrease digoxin to 0.0625mg  daily (was inadvertently taking 0.125mg  daily) - continue furosemide 40mg  BID - continue metoprolol succinate 25mg  BID - decrease spironolactone to 12.5mg  daily (was inadvertently taking 25mg  daily) - consider entresto if BID dosing will work for him - pill box  provided today along with color coded medication list - stop seroquel, thiamine and folic acid - encouraged to make lowest sodium options when eating out - BMET, CBC, dig level today - BNP 07/17/23 reviewed and was 585.4  2: HTN- - BP 130/72 - no PCP; he prefers [facility] as it's close to his house - RN called and got him an appointment scheduled for 08/18/23 - BMET 07/22/23 reviewed and showed sodium 135, potassium 3.5, creatinine 1.01 & GFR >60  3: Atrial fibrillation- - [facility] card referral placed today - continue amiodarone 200mg  daily (confused about taper) - continue apixaban 5mg  BID (emphasized taking this consistent times)  4: Polysubstance use- - smokes 1/2 ppd cigarettes - no alcohol for last 2-3 weeks - denies drug use - cessation discussed  5: Nonobstructive CAD- - continue atorvastatin 40mg  daily - LDL 03/22/23 reviewed and was 106 - lipo (a) 12/21/22 was 235.2 - R/LHC 12/23/22: Severe single-vessel coronary artery disease with chronic total occlusion of distal LCx/LPLA.  LPDA is supplied by left-to-left collaterals.  There is mild-moderate, non-obstructive disease involving the LAD. Upper normal to mildly elevated left and right heart filling pressures (LVEDP 17 mmHg, PCWP 18 mmHg, RA 7 mmHg). Moderate pulmonary hypertension (mean PA 37 mmHg, PVR 5 WU) Mildly reduced Fick cardiac output/index (CO 3.8 L/min, CI 2.1 L/min/m^2).   Itching could be related to seroquel or thiamine which are being stopped today.   Return here in 3 weeks, sooner if needed.   Delma Freeze, FNP 09/06/23

## 2023-09-06 NOTE — Telephone Encounter (Incomplete)
 Called to confirm/remind patient of their appointment at the [facility] on 09/07/23***.   Appointment:   [] Confirmed  [x] Left mess   [] No answer/No voice mail  [] Phone not in service  Patient reminded to bring all medications and/or complete list.  Confirmed patient has transportation. Gave directions, instructed to utilize valet parking.

## 2023-09-07 ENCOUNTER — Encounter: Payer: Self-pay | Admitting: Family

## 2023-09-07 ENCOUNTER — Ambulatory Visit (HOSPITAL_BASED_OUTPATIENT_CLINIC_OR_DEPARTMENT_OTHER): Admitting: Family

## 2023-09-07 ENCOUNTER — Other Ambulatory Visit
Admission: RE | Admit: 2023-09-07 | Discharge: 2023-09-07 | Disposition: A | Discharge status: Home / Self Care | Source: Ambulatory Visit | Attending: Family | Admitting: Family

## 2023-09-07 VITALS — BP 161/83 | HR 77 | Wt 149.0 lb

## 2023-09-07 DIAGNOSIS — I480 Paroxysmal atrial fibrillation: Secondary | ICD-10-CM

## 2023-09-07 DIAGNOSIS — M79674 Pain in right toe(s): Secondary | ICD-10-CM

## 2023-09-07 DIAGNOSIS — I10 Essential (primary) hypertension: Secondary | ICD-10-CM | POA: Diagnosis present

## 2023-09-07 DIAGNOSIS — I5022 Chronic systolic (congestive) heart failure: Secondary | ICD-10-CM | POA: Diagnosis not present

## 2023-09-07 DIAGNOSIS — F1910 Other psychoactive substance abuse, uncomplicated: Secondary | ICD-10-CM

## 2023-09-07 DIAGNOSIS — I2510 Atherosclerotic heart disease of native coronary artery without angina pectoris: Secondary | ICD-10-CM

## 2023-09-07 LAB — DIGOXIN LEVEL: Digoxin Level: <0.2 ng/mL — ABNORMAL LOW (ref 0.8–2.0)

## 2023-09-07 LAB — BASIC METABOLIC PANEL WITH GFR
Anion gap: 10 (ref 5–15)
BUN: 6 mg/dL — ABNORMAL LOW (ref 8–23)
CO2: 25 mmol/L (ref 22–32)
Calcium: 9.4 mg/dL (ref 8.9–10.3)
Chloride: 104 mmol/L (ref 98–111)
Creatinine, Ser: 0.8 mg/dL (ref 0.61–1.24)
GFR, Estimated: >60 mL/min (ref >60)
Glucose, Bld: 90 mg/dL (ref 70–99)
Potassium: 3.9 mmol/L (ref 3.5–5.1)
Sodium: 139 mmol/L (ref 135–145)

## 2023-09-07 LAB — URIC ACID: Uric Acid, Serum: 3.6 mg/dL — ABNORMAL LOW (ref 3.7–8.6)

## 2023-09-07 NOTE — Patient Instructions (Addendum)
 Medication Changes:  START ENTRESTO 24-26 TWICE DAILY   Lab Work:  Go over to the [facility]. Go pass the gift shop and have your blood work completed.  We will only call you if the results are abnormal or if the provider would like to make medication changes.   Follow-Up in: 3 WEEKS WITH DR. Shirlee Latch ON [facility] APRIL 30th 2:45 PM.  At the [facility], you and your health needs are our priority. We have a designated team specialized in the treatment of Heart Failure. This Care Team includes your primary Heart Failure Specialized Cardiologist (physician), Advanced Practice Providers (APPs- Physician Assistants and Nurse Practitioners), and Pharmacist who all work together to provide you with the care you need, when you need it.   You may see any of the following providers on your designated Care Team at your next follow up:  Dr. Arvilla Meres Dr. Marca Ancona Dr. Dorthula Nettles Dr. Theresia Bough Clarisa Kindred, FNP Enos Fling, RPH-CPP  Please be sure to bring in all your medications bottles to every appointment.   Need to Contact us:  If you have any questions or concerns before your next appointment please send [place] a message through [place] or call our office at [phone].    TO LEAVE A MESSAGE FOR THE NURSE SELECT OPTION 2, PLEASE LEAVE A MESSAGE INCLUDING: YOUR NAME DATE OF BIRTH CALL BACK NUMBER REASON FOR CALL**this is important as we prioritize the call backs  YOU WILL RECEIVE A CALL BACK THE SAME DAY AS LONG AS YOU CALL BEFORE 4:00 PM

## 2023-09-08 ENCOUNTER — Telehealth: Payer: Self-pay

## 2023-09-08 MED ORDER — COLCHICINE 0.6 MG PO TABS: ORAL_TABLET | ORAL | 0 refills | Status: DC

## 2023-09-08 MED ORDER — ENTRESTO 24-26 MG PO TABS: 1.0000 | ORAL_TABLET | Freq: Two times a day (BID) | ORAL | 3 refills | Status: AC

## 2023-09-08 NOTE — Telephone Encounter (Addendum)
 Lab results and medication changes below reviewed with patient per Clarisa Kindred, FNP. Colchicine sent to [facility] per pt request. Pt states a medication was supposed to started at his appt yesterday, but that his pharmacy stated they never received it. Entresto Rx sent to pt's pharmacy now. Pt had no further questions or concerns.      ----- Message from Delma Freeze sent at 09/08/2023  8:27 AM EDT ----- It's all a one time dose. 1.2mg  to start and then 0,6mg  one hour later X 1. ----- Message ----- From: Simonne Maffucci, RN Sent: 09/08/2023   8:18 AM EDT To: Delma Freeze, FNP  How long do you want them to take the 0.6 mg colchicine? Is it once daily until symptoms resolve? Thanks

## 2023-09-08 NOTE — Progress Notes (Signed)
 How long do you want them to take the 0.6 mg colchicine? Is it once daily until symptoms resolve? Thanks

## 2023-09-18 ENCOUNTER — Emergency Department

## 2023-09-18 ENCOUNTER — Emergency Department
Admission: EM | Admit: 2023-09-18 | Discharge: 2023-09-18 | Disposition: A | Discharge status: Home / Self Care | Attending: Emergency Medicine | Admitting: Emergency Medicine

## 2023-09-18 ENCOUNTER — Other Ambulatory Visit: Payer: Self-pay

## 2023-09-18 DIAGNOSIS — M79671 Pain in right foot: Secondary | ICD-10-CM | POA: Diagnosis present

## 2023-09-18 MED ORDER — GABAPENTIN 300 MG PO CAPS
300.0000 mg | ORAL_CAPSULE | Freq: Once | ORAL | Status: AC
  Administered 2023-09-18: 300 mg via ORAL
  Filled 2023-09-18: qty 3

## 2023-09-18 MED ORDER — GABAPENTIN 300 MG PO CAPS: 300.0000 mg | ORAL_CAPSULE | Freq: Three times a day (TID) | ORAL | 0 refills | Status: AC | PRN

## 2023-09-18 NOTE — ED Triage Notes (Signed)
 Pt BIB [facility] from home d/t right foot pain x2 weeks. Pt states he is in so much pain he hasn't been sleeping in weeks. However reports he has been sleeping during the day & awake at nights. Pt has hx of CHF, Afib, gout. Hasn't taken his medications in 2 days because he didn't want to get up to take them d/t needing to sleep because he has been awake all night.  No swelling or deformity visualized.

## 2023-09-18 NOTE — ED Provider Notes (Signed)
 [facility] Provider Note    Event Date/Time   First MD Initiated Contact with Patient 09/18/23 [phone]     (approximate)   History   Foot Pain   HPI  Darren Rogers is a 68 y.o. male who presents to the emergency department today because of concerns for right foot pain.  Pain has been present for roughly 2 weeks.  He denies any trauma prior to the pain starting.  Has had some associated swelling.  At a cardiologist appointment he mentioned this to the cardiologist who treated him with colchicine  thinking it might be gout.  Patient denies any significant improvement after that treatment. He denies similar symptoms in the past.      Physical Exam   Triage Vital Signs: ED Triage Vitals  Encounter Vitals Group     BP 09/18/23 0339 108/67     Systolic BP Percentile --      Diastolic BP Percentile --      Pulse Rate 09/18/23 0339 90     Resp 09/18/23 0339 17     Temp 09/18/23 0339 98.1 F (36.7 C)     Temp src --      SpO2 09/18/23 0339 97 %     Weight 09/18/23 0340 149 lb 14.6 oz (68 kg)     Height 09/18/23 0340 5\' 6"  (1.676 m)     Head Circumference --      Peak Flow --      Pain Score 09/18/23 0340 10     Pain Loc --      Pain Education --      Exclude from Growth Chart --     Most recent vital signs: Vitals:   09/18/23 0400 09/18/23 0500  BP: (!) 112/56 119/62  Pulse: 90 78  Resp:    Temp:    SpO2: 100% 95%   General: Awake, alert, oriented. CV:  Good peripheral perfusion.  Resp:  Normal effort.  Abd:  No distention.  Other:  Right foot with very mild dorsal swelling. No erythema. No warmth. Tender to palpation.    ED Results / Procedures / Treatments   Labs (all labs ordered are listed, but only abnormal results are displayed) Labs Reviewed - No data to display   EKG  None.   RADIOLOGY I independently interpreted and visualized the right foot. My interpretation: No fracture Radiology interpretation:  IMPRESSION:  1.  No  acute osseous abnormality identified.  2. Retained 7 mm metallic foreign body along the plantar soft  tissues of the lateral foot.     PROCEDURES:  Critical Care performed: No   MEDICATIONS ORDERED IN ED: Medications  gabapentin  (NEURONTIN ) capsule 300 mg (300 mg Oral Given 09/18/23 0439)     IMPRESSION / MDM / ASSESSMENT AND PLAN / ED COURSE  I reviewed the triage vital signs and the nursing notes.                              Differential diagnosis includes, but is not limited to, fracture, dislocation, contusion  Patient's presentation is most consistent with acute presentation with potential threat to life or bodily function.   Patient presented to the emergency department today because of concerns for right foot pain for a couple of weeks.  On exam does have some tenderness to the foot and slight dorsal swelling.  However no warmth or erythema.  No skin lesions.  At this time  I have very low suspicion for infection or gout.  Did obtain a x-ray which did not show any acute osseous abnormality.  Does have a history of neuropathy so we will treat with gabapentin .  At this time I do not feel any further emergent workup is necessary.  Will have patient follow-up with podiatry.  FINAL CLINICAL IMPRESSION(S) / ED DIAGNOSES   Final diagnoses:  Right foot pain    Note:  This document was prepared using Dragon voice recognition software and may include unintentional dictation errors.    Marylynn Soho, MD 09/18/23 0530

## 2023-09-29 ENCOUNTER — Encounter: Admitting: Cardiology

## 2023-10-01 ENCOUNTER — Other Ambulatory Visit: Payer: Self-pay

## 2023-10-01 ENCOUNTER — Emergency Department
Admission: EM | Admit: 2023-10-01 | Discharge: 2023-10-01 | Disposition: A | Discharge status: Home / Self Care | Attending: Emergency Medicine | Admitting: Emergency Medicine

## 2023-10-01 ENCOUNTER — Encounter: Payer: Self-pay | Admitting: Emergency Medicine

## 2023-10-01 ENCOUNTER — Emergency Department

## 2023-10-01 DIAGNOSIS — M79671 Pain in right foot: Secondary | ICD-10-CM | POA: Diagnosis present

## 2023-10-01 DIAGNOSIS — I5020 Unspecified systolic (congestive) heart failure: Secondary | ICD-10-CM | POA: Insufficient documentation

## 2023-10-01 DIAGNOSIS — M10071 Idiopathic gout, right ankle and foot: Secondary | ICD-10-CM | POA: Insufficient documentation

## 2023-10-01 MED ORDER — COLCHICINE 0.6 MG PO TABS: 0.6000 mg | ORAL_TABLET | Freq: Every day | ORAL | 0 refills | Status: DC

## 2023-10-01 MED ORDER — COLCHICINE 0.6 MG PO TABS
1.8000 mg | ORAL_TABLET | Freq: Once | ORAL | Status: AC
  Administered 2023-10-01: 1.8 mg via ORAL
  Filled 2023-10-01: qty 3

## 2023-10-01 NOTE — ED Notes (Signed)
 Imaging only at this time per provider

## 2023-10-01 NOTE — Discharge Instructions (Signed)
 You have been diagnosed with gout attack, please take 1 tablet of colchicine  at home and taking 1 hour.  Please take 1 tablet by mouth daily for 10 days.  Please call Dr. Lydia Sams and make an appointment for a follow-up.  You can follow low purine eating plan, to decrease the frequency of the pain.  Please come back to ED or go to your PCP if you have new symptoms symptoms worsen

## 2023-10-01 NOTE — ED Triage Notes (Addendum)
 Pt in ambulatory via [facility] with R foot pain - states 103mo ago he was dx with possible gout. States he was given 3 doses of pills for trx of this and symptoms improved for a short while but is now unbearable. Denies any new injury or trauma. Redness and warmth noted from foot up to shin. +2 pedal pulse present

## 2023-10-01 NOTE — ED Provider Notes (Signed)
 [facility] Provider Note    Event Date/Time   First MD Initiated Contact with Patient 10/01/23 2225     (approximate)   History   Foot Pain    HPI  Darren Rogers is a 68 y.o. male      with a past medical history of systolic heart failure, A-fib, alcohol induced acute pancreatitis,  chronic right leg pain, PAD who presents to the ED complaining of right foot pain  According to the patient, pain started 4 months ago, cardiologist diagnosed gout.  He received 3 tablets of colchicine  without improvement.  Patient states pain increases when he is drinking beer, or eating red meat.  Patient denies fever.  States he is not able to bare weight in the right foot.  Patient is taking Eliquis .     Physical Exam   Triage Vital Signs: ED Triage Vitals  Encounter Vitals Group     BP 10/01/23 2155 (!) 133/102     Systolic BP Percentile --      Diastolic BP Percentile --      Pulse Rate 10/01/23 2155 90     Resp 10/01/23 2155 18     Temp 10/01/23 2155 97.7 F (36.5 C)     Temp src --      SpO2 10/01/23 2155 98 %     Weight 10/01/23 2157 149 lb 14.6 oz (68 kg)     Height --      Head Circumference --      Peak Flow --      Pain Score 10/01/23 2157 10     Pain Loc --      Pain Education --      Exclude from Growth Chart --     Most recent vital signs: Vitals:   10/01/23 2155  BP: (!) 133/102  Pulse: 90  Resp: 18  Temp: 97.7 F (36.5 C)  SpO2: 98%     Constitutional: Alert, NAD. Able to speak in complete sentences without cough or dyspnea,  Eyes: Conjunctivae are normal.  Head: Atraumatic. Nose: No congestion/rhinnorhea. Mouth/Throat: Mucous membranes are moist.   Neck: Painless ROM. Supple. No JVD, nodes, thyromegaly  Cardiovascular:   Good peripheral circulation.RRR no murmurs, gallops, rubs  Respiratory: Normal respiratory effort.  No retractions. Clear to auscultation bilaterally without wheezing or crackles  Gastrointestinal: Soft and  nontender.  Musculoskeletal:  Right ankle: Dry skin, no deformity, tender to palpation in the dorsal area of the foot, and in the lateral area.   Neurologic:  MAE spontaneously. No gross focal neurologic deficits are appreciated.  Skin:  Skin is warm, dry and intact. No rash noted. Psychiatric: Mood and affect are normal. Speech and behavior are normal.    ED Results / Procedures / Treatments   Labs (all labs ordered are listed, but only abnormal results are displayed) Labs Reviewed - No data to display   EKG  RADIOLOGY I independently reviewed and interpreted imaging and agree with radiologists findings.      PROCEDURES:  Critical Care performed:   Procedures   MEDICATIONS ORDERED IN ED: Medications  colchicine  tablet 1.8 mg (has no administration in time range)   Clinical Course as of 10/01/23 2325  Fri Oct 01, 2023  2311 DG Foot Complete Right . No acute fracture or dislocation. 2. Thin wire like linear foreign body measuring 6 mm in length in the soft tissues of the lateral plantar surface of the foot at the level of the cuboid.   [  AE]    Clinical Course User Index [AE] Awilda Lennox, PA-C    IMPRESSION / MDM / ASSESSMENT AND PLAN / ED COURSE  I reviewed the triage vital signs and the nursing notes.  Differential diagnosis includes, but is not limited to, gout, arthritis, fracture, foreign body  Patient's presentation is most consistent with acute complicated illness / injury requiring diagnostic workup.  Patient's diagnosis is consistent with gout. I independently reviewed and interpreted imaging and agree with radiologists findings no fracture or dislocation. . I did review the patient's allergies and medications.During admission patient received colchicine  2 tablets.  Patient is going to take 1 tablet 1 hour later at home . The patient is in stable and satisfactory condition for discharge home.  Patient will be discharged home with prescriptions for  colchicine .  I did recommend low purine diet to the patient.  Patient is to follow up with rheumatology as needed or otherwise directed. Patient is given ED precautions to return to the ED for any worsening or new symptoms. Discussed plan of care with patient, answered all of patient's questions, Patient agreeable to plan of care. Advised patient to take medications according to the instructions on the label. Discussed possible side effects of new medications. Patient verbalized understanding.    FINAL CLINICAL IMPRESSION(S) / ED DIAGNOSES   Final diagnoses:  Acute idiopathic gout of right foot     Rx / DC Orders   ED Discharge Orders          Ordered    colchicine  0.6 MG tablet  Daily        10/01/23 2324             Note:  This document was prepared using Dragon voice recognition software and may include unintentional dictation errors.   Awilda Lennox, PA-C 10/01/23 2325    Shane Darling, MD 10/01/23 [phone]

## 2023-10-03 ENCOUNTER — Emergency Department
Admission: EM | Admit: 2023-10-03 | Discharge: 2023-10-03 | Disposition: A | Discharge status: Home / Self Care | Attending: Emergency Medicine | Admitting: Emergency Medicine

## 2023-10-03 ENCOUNTER — Other Ambulatory Visit: Payer: Self-pay

## 2023-10-03 ENCOUNTER — Encounter: Payer: Self-pay | Admitting: Emergency Medicine

## 2023-10-03 DIAGNOSIS — F17200 Nicotine dependence, unspecified, uncomplicated: Secondary | ICD-10-CM | POA: Diagnosis not present

## 2023-10-03 DIAGNOSIS — M109 Gout, unspecified: Secondary | ICD-10-CM | POA: Diagnosis not present

## 2023-10-03 DIAGNOSIS — Z7901 Long term (current) use of anticoagulants: Secondary | ICD-10-CM | POA: Diagnosis not present

## 2023-10-03 DIAGNOSIS — I5023 Acute on chronic systolic (congestive) heart failure: Secondary | ICD-10-CM | POA: Diagnosis not present

## 2023-10-03 DIAGNOSIS — Z79899 Other long term (current) drug therapy: Secondary | ICD-10-CM | POA: Diagnosis not present

## 2023-10-03 DIAGNOSIS — M79671 Pain in right foot: Secondary | ICD-10-CM | POA: Diagnosis present

## 2023-10-03 DIAGNOSIS — I2510 Atherosclerotic heart disease of native coronary artery without angina pectoris: Secondary | ICD-10-CM | POA: Insufficient documentation

## 2023-10-03 DIAGNOSIS — I110 Hypertensive heart disease with heart failure: Secondary | ICD-10-CM | POA: Diagnosis not present

## 2023-10-03 MED ORDER — HYDROCODONE-ACETAMINOPHEN 5-325 MG PO TABS
1.0000 | ORAL_TABLET | Freq: Once | ORAL | Status: AC
  Administered 2023-10-03: 1 via ORAL
  Filled 2023-10-03: qty 1

## 2023-10-03 NOTE — ED Provider Notes (Signed)
 [facility] Provider Note    Event Date/Time   First MD Initiated Contact with Patient 10/03/23 [phone]     (approximate)   History   Foot Pain   HPI  Darren Rogers is a 68 y.o. male who returns to the ED via EMS from home with a chief complaint of right foot pain.  Patient seen in the ED last evening for same, diagnosed with gout, placed on colchicine .  States he has taken 2 doses of colchicine  without relief of symptoms.  Reports pain worse at night and with EtOH.  Used EtOH tonight to try to temper the pain.  Denies acute fall/injury/trauma.  Voices no other complaints or injuries.     Past Medical History   Past Medical History:  Diagnosis Date   Chronic HFrEF (heart failure with reduced ejection fraction) (HCC)    a. 03/29/2014: EF 55-60%, mild LVH, normal RVSP;  b. 05/2015 Echo: EF 60-65%, triv AI, mild MR; c. 03/2021 Echo: EF 55-60%, no rwma, mild LVH, nl RV fxn, mildly dil LA, mild MR; d. 11/2022 Echo: EF 35-40%, mild LVH, nl RV fxn, RVSP 53.11mmhg. Mild BAE. Mild to mod MR/TR/PR. Mild AI. Ao root 42mm.   Claudication (HCC)    a. 06/2015 ABI: R - 0.73, L - 0.73. 30-49% bilat SFA stenosis. 50-74% R Profunda stenosis; b. 01/2017 ABI: R 0.89, L 0.88, TBI R 0.65, L 1.0.   Erectile dysfunction    Essential hypertension    Hyperlipidemia    Non-obstructive CAD    a. 01/27/2014 Cath: LM nl, LAD mild diff dz w/o obs, LCX no sig obs - scattered 20-30% mLCx, RCA no obs dz, EF 70%; b. 06/2015 MV; EF 60%, no ischemia; c. 07/2020 MV: No ischemia/scar; d. 11/2022 Cath: LM nl, LAD 35ost/p, LCX 20d, LPDA fills from dLAD, LPAV 100 CTO, RCA small, nl.   Persistent atrial fibrillation (HCC)    a. Pt says Dx >62yrs ago w/ ? RFCA @ [facility];  b. Recurrent 12/2013;  b. Rx Flecainide  and Xarelto -->Intermittent compliance; c. 03/2021 Zio: 100% Afib w/ avg rate of 99 bpm. Rare PVCs.   PVC's (premature ventricular contractions)    a. 05/2017 Zio: 4000 PVCs in 48 hrs (2%). Brief run of SVT.    Tobacco abuse    a. ongoing - 1 ppd.     Active Problem List   Patient Active Problem List   Diagnosis Date Noted   History of medication noncompliance 07/21/2023   Acute respiratory failure with hypoxia (HCC) 07/14/2023   Dilated cardiomyopathy (HCC) 07/14/2023   Alcoholic cirrhosis of liver with ascites (HCC) 07/11/2023   Anasarca 07/11/2023   Overweight (BMI 25.0-29.9) 07/11/2023   Acute on chronic HFrEF (heart failure with reduced ejection fraction) (HCC) 07/11/2023   CHF (congestive heart failure) (HCC) 07/10/2023   Chronic systolic CHF (congestive heart failure) (HCC) 03/22/2023   Myocardial injury 03/22/2023   Fall 03/22/2023   HLD (hyperlipidemia) 03/22/2023   Acute HFrEF (heart failure with reduced ejection fraction) (HCC) 12/23/2022   Elevated troponin 12/23/2022   Chronic atrial fibrillation with RVR (HCC) 08/06/2022   GERD without esophagitis 08/06/2022   Hyponatremia 08/06/2022   Hyperkalemia 08/06/2022   Atrial fibrillation, chronic (HCC) 05/19/2022   GERD (gastroesophageal reflux disease) 05/19/2022   AKI (acute kidney injury) (HCC) 03/10/2021   Lactic acidosis 03/10/2021   Acute encephalopathy 03/10/2021   Symptomatic bradycardia 03/09/2021   Abdominal pain, RUQ    Alcoholic pancreatitis 12/07/2020   Non-adherence to medical treatment  12/07/2020   Atrial fibrillation (HCC) 11/20/2020   Abscess of buccal space of mouth 11/19/2020   Dental abscess 11/19/2020   Facial cellulitis_left 11/19/2020   Cellulitis of face    Atrial fibrillation with RVR (HCC) 08/06/2020   Demand ischemia (HCC)    Polysubstance abuse (HCC)    Rapid atrial fibrillation (HCC) 08/05/2020   Alcohol use disorder, moderate, dependence (HCC) 08/05/2020   Cocaine abuse (HCC) 08/05/2020   Alcohol abuse 10/20/2019   Depression 10/20/2019   A-fib (HCC) 01/15/2017   Personal history of tobacco use, presenting hazards to health 08/17/2016   BPH associated with nocturia 07/30/2016   IFG  (impaired fasting glucose) 07/30/2016   Other chest pain 08/22/2015   Essential hypertension    PAD (peripheral artery disease) (HCC)    Drug-induced erectile dysfunction 01/22/2015   CAD (coronary artery disease) 04/11/2014   CAD in native artery 04/11/2014   Paroxysmal atrial fibrillation (HCC)    NSTEMI (non-ST elevated myocardial infarction) (HCC) 01/29/2014   Tobacco abuse    Hyperlipidemia    Atrial fibrillation with rapid ventricular response (HCC) 01/27/2014     Past Surgical History   Past Surgical History:  Procedure Laterality Date   CARDIAC CATHETERIZATION     CARDIAC CATHETERIZATION     [facility]   LEFT HEART CATH Right 01/27/2014   Procedure: LEFT HEART CATH;  Surgeon: Arlander Bellman, MD;  Location: [facility] CATH LAB;  Service: Cardiovascular;  Laterality: Right;   RIGHT/LEFT HEART CATH AND CORONARY ANGIOGRAPHY N/A 12/23/2022   Procedure: RIGHT/LEFT HEART CATH AND CORONARY ANGIOGRAPHY;  Surgeon: Sammy Crisp, MD;  Location: [facility] INVASIVE CV LAB;  Service: Cardiovascular;  Laterality: N/A;     Home Medications   Prior to Admission medications   Medication Sig Start Date End Date Taking? Authorizing Provider  amiodarone  (PACERONE ) 200 MG tablet Take 2 tablets (400 mg total) by mouth 2 (two) times daily for 5 days, THEN 1 tablet (200 mg total) 2 (two) times daily for 7 days, THEN 1 tablet (200 mg total) daily. 07/23/23 10/21/23  Luna Salinas, MD  apixaban  (ELIQUIS ) 5 MG TABS tablet Take 1 tablet (5 mg total) by mouth 2 (two) times daily. 07/23/23   Luna Salinas, MD  atorvastatin  (LIPITOR) 40 MG tablet Take 1 tablet (40 mg total) by mouth daily. 07/23/23 01/19/24  Luna Salinas, MD  colchicine  0.6 MG tablet Take 1 tablet (0.6 mg total) by mouth daily. 10/01/23   Evans, Alexandra, PA-C  dapagliflozin  propanediol (FARXIGA ) 10 MG TABS tablet Take 1 tablet (10 mg total) by mouth daily. 07/24/23   Amin, Sumayya, MD  digoxin  (LANOXIN ) 0.125 MG tablet Take 0.5 tablets (0.0625 mg total) by  mouth daily. 07/24/23   Amin, Sumayya, MD  furosemide  (LASIX ) 40 MG tablet Take 1 tablet (40 mg total) by mouth 2 (two) times daily. 07/23/23   Amin, Sumayya, MD  gabapentin  (NEURONTIN ) 300 MG capsule Take 1 capsule (300 mg total) by mouth 3 (three) times daily as needed. 09/18/23 09/17/24  Goodman, Graydon, MD  metoprolol  succinate (TOPROL -XL) 25 MG 24 hr tablet Take 1 tablet (25 mg total) by mouth 2 (two) times daily. 07/23/23   Amin, Sumayya, MD  Multiple Vitamin (MULTIVITAMIN WITH MINERALS) TABS tablet Take 1 tablet by mouth daily. Patient not taking: Reported on 09/07/2023 12/29/22   Alexander, Natalie, DO  nicotine  (NICODERM CQ  - DOSED IN MG/24 HOURS) 21 mg/24hr patch Place 1 patch (21 mg total) onto the skin daily. 03/27/23   Aisha Hove, MD  nitroGLYCERIN  (  NITROSTAT ) 0.4 MG SL tablet Place 1 tablet (0.4 mg total) under the tongue every 5 (five) minutes x 3 doses as needed for chest pain. 12/28/22   Alexander, Natalie, DO  pantoprazole  (PROTONIX ) 40 MG tablet Take 1 tablet (40 mg total) by mouth daily. 07/23/23   Amin, Sumayya, MD  polyethylene glycol powder (GLYCOLAX /MIRALAX ) 17 GM/SCOOP powder Take 17 g by mouth daily as needed for mild constipation. Mix as directed. 07/23/23   Luna Salinas, MD  sacubitril -valsartan  (ENTRESTO ) 24-26 MG Take 1 tablet by mouth 2 (two) times daily. 09/08/23   Charlette Console, FNP  spironolactone  (ALDACTONE ) 25 MG tablet Take 0.5 tablets (12.5 mg total) by mouth daily. Patient taking differently: Take by mouth daily. 07/23/23   Amin, Sumayya, MD     Allergies  Patient has no known allergies.   Family History   Family History  Problem Relation Age of Onset   Coronary artery disease Father 69   Diabetes Mother    Diabetes Sister    Diabetes Brother    Diabetes Maternal Grandmother    Diabetes Sister    Diabetes Sister      Physical Exam  Triage Vital Signs: ED Triage Vitals  Encounter Vitals Group     BP 10/03/23 0028 (!) 110/90     Systolic BP  Percentile --      Diastolic BP Percentile --      Pulse Rate 10/03/23 0025 86     Resp 10/03/23 0025 18     Temp 10/03/23 0025 97.6 F (36.4 C)     Temp Source 10/03/23 0025 Oral     SpO2 10/03/23 0025 98 %     Weight 10/03/23 0027 149 lb 14.6 oz (68 kg)     Height --      Head Circumference --      Peak Flow --      Pain Score 10/03/23 0027 10     Pain Loc --      Pain Education --      Exclude from Growth Chart --     Updated Vital Signs: BP (!) 110/90   Pulse 86   Temp 97.6 F (36.4 C) (Oral)   Resp 18   Wt 68 kg   SpO2 98%   BMI 24.20 kg/m    General: Awake, no distress.  Yells and flinches even before I examined him. CV:  Good peripheral perfusion.  Resp:  Normal effort.  Abd:  No distention.  Other:  Right foot: Tender to palpation dorsal aspect beneath great toe.  No appreciable warmth/erythema.  Able to wiggle toes freely.  Full range of motion ankle without pain.  2+ distal pulses.  Brisk, less than 5-second capillary refill.   ED Results / Procedures / Treatments  Labs (all labs ordered are listed, but only abnormal results are displayed) Labs Reviewed - No data to display   EKG  None   RADIOLOGY None   Official radiology report(s): No results found.   PROCEDURES:  Critical Care performed: No  Procedures   MEDICATIONS ORDERED IN ED: Medications  HYDROcodone-acetaminophen  (NORCO/VICODIN) 5-325 MG per tablet 1 tablet (1 tablet Oral Given 10/03/23 0120)     IMPRESSION / MDM / ASSESSMENT AND PLAN / ED COURSE  I reviewed the triage vital signs and the nursing notes.                             68 year old male who  returns for continued right foot pain secondary to gout.  I personally reviewed patient's ED visit from last night including his x-rays.  Cautioned him against using excessive alcohol.  1 time dose of Norco will given now, placed in podiatric shoe, provide walker.  Advised patient to follow-up with podiatry, follow-up information  included.  Strict return precautions given.  Patient verbalizes understanding and agrees with plan of care.  Patient's presentation is most consistent with acute, uncomplicated illness.    FINAL CLINICAL IMPRESSION(S) / ED DIAGNOSES   Final diagnoses:  Foot pain, right  Acute gout of right foot, unspecified cause     Rx / DC Orders   ED Discharge Orders     None        Note:  This document was prepared using Dragon voice recognition software and may include unintentional dictation errors.   Megin Consalvo J, MD 10/03/23 [phone]

## 2023-10-03 NOTE — ED Triage Notes (Signed)
 Fall bracelet and grip socks applied by this RN due to patient being a high fall risk.

## 2023-10-03 NOTE — ED Notes (Signed)
 Pt Aox4 cleared and discharged. All discharge teachings given to pt. Ortho boot placed per MD order. Pt verbalizes back understanding of all teachings. Departed in wheelchair

## 2023-10-03 NOTE — Discharge Instructions (Signed)
 Continue to take your Colchicine  as prescribed yesterday.  You may add Tylenol  as needed for more severe pain.  Wear podiatric shoe and use walker to balance as you walk as needed for comfort.  Do not drink alcohol heavily because this worsens your pain.  Return to the ER for worsening symptoms or other concerns.

## 2023-10-03 NOTE — ED Triage Notes (Signed)
 Pt in with continued R foot pain - seen for gout to the foot yesterday, was d/c on colchicine , states he has taken 2 doses since and is having no improvement of pain. Endorses ETOH use tonight

## 2023-10-04 ENCOUNTER — Telehealth: Payer: Self-pay | Admitting: Family

## 2023-10-04 NOTE — Progress Notes (Deleted)
 Advanced Heart Failure Clinic Note    PCP: Pcp, No Cardiologist: Antionette Kirks, MD / Laneta Pintos, NP (last seen 03/23)  Chief Complaint: shortness of breath/ right foot swelling with toe tenderness  HPI:  Darren Rogers is a 68 y/o male with a history of persistent atrial fibrillation, nonobstructive CAD, hypertension, hyperlipidemia, noncompliance, and polysubstance abuse, hyponatremia, alcoholic liver cirrhosis and chronic heart failure.   Previously diagnosed with atrial fibrillation greater than 20 years ago and reports history of some type of ablation at [facility]. He had recurrent atrial fibrillation in August 2015, and in that setting, experience chest pain. He underwent diagnostic catheterization which showed mild, nonobstructive CAD. A-fib was previously managed with flecainide  and Xarelto  however, compliance was poor.   January 2017, he had recurrent chest pain and atrial fibrillation and underwent stress testing which was nonischemic. He also reported lower extremity claudication and underwent ABIs, which were abnormal with suggestion of mild to moderate bilateral SFA and right profunda stenoses. Recurrent atrial fibrillation in August 2018 in the setting of noncompliance and was resumed on metoprolol , flecainide , and Xarelto  however, was lost to follow-up.   Multiple admissions in 2022 in the setting of rapid atrial fibrillation and atypical chest pain in the setting of noncompliance. It was felt that A-fib was likely transitioning to permanent and a ZIO monitor was placed at discharge. This subsequently showed 100% atrial fibrillation burden with an average heart rate of 92 bpm.Again, did not continue follow-up.  Continued to have multiple admissions during 2024 for alcohol induced pancreatitis or atrial fibrillation. Most recent admission was 07/10/23 with increased shortness of breath, pedal edema and malaise. Drinks 48 oz beer BID and not taking his medications. Found to have  anasarca, chest x-ray showed cardiomegaly with pulmonary edema, significant elevated BNP. Patient was started on V diuretics for CHF exacerbation & then transitioned to oral diuretics. Echocardiogram with EF of 30 to 35%. Developed AF RVR and was placed on amiodarone  infusion & converted back to NSR. Converted to p.o. amiodarone , he should be taking 400 mg twice daily for next 5 days followed by 200 mg twice daily for 7 days and then 200 mg daily. Hyponatremia, hypokalemia and hypophosphatemia during hospitalization which were corrected before discharge.   He presents today for a HF follow-up visit with a chief complaint of minimal shortness of breath. Has associated fatigue and right foot swelling/ toe tender to touch; can't stand to have sheets touch it. Denies chest pain, cough, palpitations, abdominal distention or dizziness. Some difficulty sleeping due to pain in right foot/ toes. Itching that was present at last visit is much improved since stopping medications.   At last visit, spironolactone  was decreased to 12.5mg  daily along with reduction of digoxin  to 0.0625mg  daily. Has his medications with him today and is taking everything as listed. BID medications are being taken at 8am and then again at 8pm.   Smoking 1/2 ppd cigarettes, was smoking 1 ppd. Has drank 1 beer recently. Denies drug use  ROS: All systems negative except what is listed in HPI, PMH and Problem List   Past Medical History:  Diagnosis Date   Chronic HFrEF (heart failure with reduced ejection fraction) (HCC)    a. 03/29/2014: EF 55-60%, mild LVH, normal RVSP;  b. 05/2015 Echo: EF 60-65%, triv AI, mild Darren; c. 03/2021 Echo: EF 55-60%, no rwma, mild LVH, nl RV fxn, mildly dil LA, mild Darren; d. 11/2022 Echo: EF 35-40%, mild LVH, nl RV fxn, RVSP 53.23mmhg. Mild BAE. Mild to  mod Darren/TR/PR. Mild AI. Ao root 42mm.   Claudication (HCC)    a. 06/2015 ABI: R - 0.73, L - 0.73. 30-49% bilat SFA stenosis. 50-74% R Profunda stenosis; b. 01/2017  ABI: R 0.89, L 0.88, TBI R 0.65, L 1.0.   Erectile dysfunction    Essential hypertension    Hyperlipidemia    Non-obstructive CAD    a. 01/27/2014 Cath: LM nl, LAD mild diff dz w/o obs, LCX no sig obs - scattered 20-30% mLCx, RCA no obs dz, EF 70%; b. 06/2015 MV; EF 60%, no ischemia; c. 07/2020 MV: No ischemia/scar; d. 11/2022 Cath: LM nl, LAD 35ost/p, LCX 20d, LPDA fills from dLAD, LPAV 100 CTO, RCA small, nl.   Persistent atrial fibrillation (HCC)    a. Pt says Dx >76yrs ago w/ ? RFCA @ [facility];  b. Recurrent 12/2013;  b. Rx Flecainide  and Xarelto -->Intermittent compliance; c. 03/2021 Zio: 100% Afib w/ avg rate of 99 bpm. Rare PVCs.   PVC's (premature ventricular contractions)    a. 05/2017 Zio: 4000 PVCs in 48 hrs (2%). Brief run of SVT.   Tobacco abuse    a. ongoing - 1 ppd.    Current Outpatient Medications  Medication Sig Dispense Refill   amiodarone  (PACERONE ) 200 MG tablet Take 2 tablets (400 mg total) by mouth 2 (two) times daily for 5 days, THEN 1 tablet (200 mg total) 2 (two) times daily for 7 days, THEN 1 tablet (200 mg total) daily. 112 tablet 0   apixaban  (ELIQUIS ) 5 MG TABS tablet Take 1 tablet (5 mg total) by mouth 2 (two) times daily. 180 tablet 3   atorvastatin  (LIPITOR) 40 MG tablet Take 1 tablet (40 mg total) by mouth daily. 90 tablet 1   colchicine  0.6 MG tablet Take 1 tablet (0.6 mg total) by mouth daily. 10 tablet 0   dapagliflozin  propanediol (FARXIGA ) 10 MG TABS tablet Take 1 tablet (10 mg total) by mouth daily. 90 tablet 2   digoxin  (LANOXIN ) 0.125 MG tablet Take 0.5 tablets (0.0625 mg total) by mouth daily. 45 tablet 1   furosemide  (LASIX ) 40 MG tablet Take 1 tablet (40 mg total) by mouth 2 (two) times daily. 180 tablet 2   gabapentin  (NEURONTIN ) 300 MG capsule Take 1 capsule (300 mg total) by mouth 3 (three) times daily as needed. 15 capsule 0   metoprolol  succinate (TOPROL -XL) 25 MG 24 hr tablet Take 1 tablet (25 mg total) by mouth 2 (two) times daily. 180 tablet 2    Multiple Vitamin (MULTIVITAMIN WITH MINERALS) TABS tablet Take 1 tablet by mouth daily. (Patient not taking: Reported on 09/07/2023)     nicotine  (NICODERM CQ  - DOSED IN MG/24 HOURS) 21 mg/24hr patch Place 1 patch (21 mg total) onto the skin daily. 28 patch 0   nitroGLYCERIN  (NITROSTAT ) 0.4 MG SL tablet Place 1 tablet (0.4 mg total) under the tongue every 5 (five) minutes x 3 doses as needed for chest pain. 30 tablet 0   pantoprazole  (PROTONIX ) 40 MG tablet Take 1 tablet (40 mg total) by mouth daily. 90 tablet 2   polyethylene glycol powder (GLYCOLAX /MIRALAX ) 17 GM/SCOOP powder Take 17 g by mouth daily as needed for mild constipation. Mix as directed. 238 g 0   sacubitril -valsartan  (ENTRESTO ) 24-26 MG Take 1 tablet by mouth 2 (two) times daily. 60 tablet 3   spironolactone  (ALDACTONE ) 25 MG tablet Take 0.5 tablets (12.5 mg total) by mouth daily. (Patient taking differently: Take by mouth daily.) 45 tablet 3   No  current facility-administered medications for this visit.    No Known Allergies    Social History   Socioeconomic History   Marital status: Single    Spouse name: Not on file   Number of children: Not on file   Years of education: Not on file   Highest education level: 12th grade  Occupational History   Not on file  Tobacco Use   Smoking status: Every Day    Current packs/day: 0.50    Average packs/day: 0.5 packs/day for 30.0 years (15.0 ttl pk-yrs)    Types: Cigarettes   Smokeless tobacco: Never  Vaping Use   Vaping status: Never Used  Substance and Sexual Activity   Alcohol use: Yes    Comment: 4 (40 ounce beers a day)   Drug use: Not Currently    Types: Cocaine   Sexual activity: Not on file  Other Topics Concern   Not on file  Social History Narrative   The patient lives in [place]  with his sister. He works in a substance abuse program. He is a long-time smoker, one pack per day. ETOH use 4 ( 40oz beers a day).No illicit drugs. He is not married.    Social Drivers of Corporate investment banker Strain: Low Risk  (07/13/2023)   Overall Financial Resource Strain (CARDIA)    Difficulty of Paying Living Expenses: Not very hard  Food Insecurity: No Food Insecurity (07/13/2023)   Hunger Vital Sign    Worried About Running Out of Food in the Last Year: Never true    Ran Out of Food in the Last Year: Never true  Transportation Needs: Unmet Transportation Needs (07/13/2023)   PRAPARE - Administrator, Civil Service (Medical): Yes    Lack of Transportation (Non-Medical): Yes  Physical Activity: Not on file  Stress: Not on file  Social Connections: Moderately Isolated (07/11/2023)   Social Connection and Isolation Panel [NHANES]    Frequency of Communication with Friends and Family: More than three times a week    Frequency of Social Gatherings with Friends and Family: Three times a week    Attends Religious Services: More than 4 times per year    Active Member of Clubs or Organizations: No    Attends Banker Meetings: Never    Marital Status: Never married  Intimate Partner Violence: Not At Risk (07/11/2023)   Humiliation, Afraid, Rape, and Kick questionnaire    Fear of Current or Ex-Partner: No    Emotionally Abused: No    Physically Abused: No    Sexually Abused: No      Family History  Problem Relation Age of Onset   Coronary artery disease Father 51   Diabetes Mother    Diabetes Sister    Diabetes Brother    Diabetes Maternal Grandmother    Diabetes Sister    Diabetes Sister    There were no vitals filed for this visit.  Wt Readings from Last 3 Encounters:  10/01/23 149 lb 14.6 oz (68 kg)  09/18/23 149 lb 14.6 oz (68 kg)  09/07/23 149 lb (67.6 kg)   Lab Results  Component Value Date   CREATININE 0.80 09/07/2023   CREATININE 0.86 07/27/2023   CREATININE 1.01 07/22/2023    PHYSICAL EXAM:  General: Well appearing. No resp difficulty HEENT: normal Neck: supple, no JVD Cor: Regular rhythm,  rate. No rubs, gallops. 2/6 systolic murmur Lungs: clear Abdomen: soft, nontender, nondistended. Extremities: no cyanosis, clubbing, rash, 1+ pitting edema  right lower leg, right great toe tender to touch Neuro: alert & oriented X 3. Moves all 4 extremities w/o difficulty. Affect pleasant   ECG: not done (he deferred today)   ASSESSMENT & PLAN:  1: Chronic heart failure with reduced ejection fraction- - suspect due to alcohol use/ atrial fibrillation/ obstructive CAD; cath 07/24 showed chronic total occlusion of distal LCx/LPLA - NYHA class II - euvolemic - weight up 4 pounds from last visit here 6 weeks ago - Echo 07/13/23: EF 30-35%, RV moderately reduced, mildly elevated PA pressure, RA moderately dilated, moderate Darren, severe TR, mild AR - continue farxiga  10mg  daily - continue digoxin  0.0625mg  daily  - continue furosemide  40mg  BID - continue metoprolol  succinate 25mg  BID; consider titration - continue spironolactone  12.5mg  daily; consider increasing to 25mg  daily if labs remain stable - begin entresto  24/26mg  BID since he's consistently taking BID medications - dig level 07/27/23 was 0.9; recheck today since now taking a lower dose of digoxin  - encouraged to make lowest sodium options when eating out - BNP 07/17/23 reviewed and was 585.4  2: HTN- - BP 161/83 - starting entresto  per above - still has no PCP as [facility] is not taking appointments; CMA will work on this - BMET 07/27/23 reviewed and showed sodium 140, potassium 4.3, creatinine 0.86 & GFR 94 - BMET today & again at next visit  3: Atrial fibrillation- - NS for [facility] appt 08/03/23 - regular rate today - continue amiodarone  200mg  daily  - continue apixaban  5mg  BID - DCCV 02/25  4: Polysubstance use- - smokes 1/2 ppd cigarettes - 1 beer recently - denies drug use - cessation discussed  5: Nonobstructive CAD- - no angina - continue atorvastatin  40mg  daily - LDL 03/22/23 reviewed and was 106 - lipo (a)  12/21/22 was 235.2 - recheck lipid panel in a few months since he's now being consistent with taking his medications - [facility] 12/23/22: Severe single-vessel coronary artery disease with chronic total occlusion of distal LCx/LPDA.  LPDA is supplied by left-to-left collaterals.  There is mild-moderate, non-obstructive disease involving the LAD. Upper normal to mildly elevated left and right heart filling pressures (LVEDP 17 mmHg, PCWP 18 mmHg, RA 7 mmHg). Moderate pulmonary hypertension (mean PA 37 mmHg, PVR 5 WU) Mildly reduced Fick cardiac output/index (CO 3.8 L/min, CI 2.1 L/min/m^2).  6: ? Gout/ right toe pain- - most likely symptoms are due to gout - uric acid today  - depending uric acid result & renal function result, may need treatment with colchicine  since he has no PCP   Return in 3 weeks with MD, sooner if needed.   Charlette Console, FNP 10/04/23

## 2023-10-04 NOTE — Telephone Encounter (Signed)
 Called to confirm/remind patient of their appointment at the [facility] on 10/05/23.   Appointment:   [] Confirmed  [x] Left mess   [] No answer/No voice mail  [] VM Full/unable to leave message  [] Phone not in service  Patient reminded to bring all medications and/or complete list.  Confirmed patient has transportation. Gave directions, instructed to utilize valet parking.

## 2023-10-05 ENCOUNTER — Encounter: Admitting: Family

## 2023-10-05 ENCOUNTER — Telehealth: Payer: Self-pay | Admitting: Family

## 2023-10-05 NOTE — Telephone Encounter (Signed)
 Patient did not show for his Heart Failure Clinic appointment on 10/05/23.

## 2023-10-05 NOTE — Progress Notes (Incomplete)
 [facility] - HEART FAILURE CLINIC - PHARMACIST COUNSELING NOTE  Guideline-Directed Medical Therapy/Evidence Based Medicine  ACE/ARB/ARNI: {AR_ARNI/ACEi/ARB:25599} Beta Blocker: {AR_Beta blocker:25598} Aldosterone Antagonist: {AR_Mineralocorticoid receptor antagonists:25602} Diuretic: {AR_Diuretics:25604} SGLT2i: {AR_SGLT2i:25603}  Adherence Assessment  Do you ever forget to take your medication? [] Yes [] No  Do you ever skip doses due to side effects? [] Yes [] No  Do you have trouble affording your medicines? [] Yes [] No  Are you ever unable to pick up your medication due to transportation difficulties? [] Yes [] No  Do you ever stop taking your medications because you don't believe they are helping? [] Yes [] No  Do you check your weight daily? [] Yes [] No   Adherence strategy: ***  Barriers to obtaining medications: ***  Vital signs: HR ***, BP ***, weight (pounds) *** ECHO: Date ***, EF ***, notes *** Cath: Date ***, EF ***, notes ***     Latest Ref Rng & Units 09/07/2023    4:22 PM 07/27/2023    3:46 PM 07/22/2023    6:33 AM  BMP  Glucose 70 - 99 mg/dL 90  85  98   BUN 8 - 23 mg/dL 6  10  20    Creatinine 0.61 - 1.24 mg/dL 1.61  0.96  0.45   BUN/Creat Ratio 10 - 24  12    Sodium 135 - 145 mmol/L 139  140  135   Potassium 3.5 - 5.1 mmol/L 3.9  4.3  3.5   Chloride 98 - 111 mmol/L 104  99  99   CO2 22 - 32 mmol/L 25  25  26    Calcium  8.9 - 10.3 mg/dL 9.4  9.7  9.3     Past Medical History:  Diagnosis Date   Chronic HFrEF (heart failure with reduced ejection fraction) (HCC)    a. 03/29/2014: EF 55-60%, mild LVH, normal RVSP;  b. 05/2015 Echo: EF 60-65%, triv AI, mild MR; c. 03/2021 Echo: EF 55-60%, no rwma, mild LVH, nl RV fxn, mildly dil LA, mild MR; d. 11/2022 Echo: EF 35-40%, mild LVH, nl RV fxn, RVSP 53.34mmhg. Mild BAE. Mild to mod MR/TR/PR. Mild AI. Ao root 42mm.   Claudication (HCC)    a. 06/2015 ABI: R - 0.73, L - 0.73. 30-49% bilat SFA stenosis.  50-74% R Profunda stenosis; b. 01/2017 ABI: R 0.89, L 0.88, TBI R 0.65, L 1.0.   Erectile dysfunction    Essential hypertension    Hyperlipidemia    Non-obstructive CAD    a. 01/27/2014 Cath: LM nl, LAD mild diff dz w/o obs, LCX no sig obs - scattered 20-30% mLCx, RCA no obs dz, EF 70%; b. 06/2015 MV; EF 60%, no ischemia; c. 07/2020 MV: No ischemia/scar; d. 11/2022 Cath: LM nl, LAD 35ost/p, LCX 20d, LPDA fills from dLAD, LPAV 100 CTO, RCA small, nl.   Persistent atrial fibrillation (HCC)    a. Pt says Dx >70yrs ago w/ ? RFCA @ [facility];  b. Recurrent 12/2013;  b. Rx Flecainide  and Xarelto -->Intermittent compliance; c. 03/2021 Zio: 100% Afib w/ avg rate of 99 bpm. Rare PVCs.   PVC's (premature ventricular contractions)    a. 05/2017 Zio: 4000 PVCs in 48 hrs (2%). Brief run of SVT.   Tobacco abuse    a. ongoing - 1 ppd.    ASSESSMENT *** year old {Gender Description:[id number]} who presents to the HF clinic ***  Recent ED Visit (past 6 months): Date - ***, CC - ***  PLAN     Time spent: 15 minutes  Craven Do, PharmD Pharmacy  Resident  10/05/2023 12:15 PM

## 2023-10-15 ENCOUNTER — Encounter: Payer: Self-pay | Admitting: Intensive Care

## 2023-10-15 ENCOUNTER — Other Ambulatory Visit: Payer: Self-pay

## 2023-10-15 ENCOUNTER — Emergency Department
Admission: EM | Admit: 2023-10-15 | Discharge: 2023-10-15 | Disposition: A | Discharge status: Home / Self Care | Attending: Emergency Medicine | Admitting: Emergency Medicine

## 2023-10-15 DIAGNOSIS — M109 Gout, unspecified: Secondary | ICD-10-CM

## 2023-10-15 DIAGNOSIS — I2510 Atherosclerotic heart disease of native coronary artery without angina pectoris: Secondary | ICD-10-CM | POA: Insufficient documentation

## 2023-10-15 DIAGNOSIS — I10 Essential (primary) hypertension: Secondary | ICD-10-CM | POA: Insufficient documentation

## 2023-10-15 DIAGNOSIS — M79671 Pain in right foot: Secondary | ICD-10-CM | POA: Diagnosis present

## 2023-10-15 HISTORY — DX: Gout, unspecified: M10.9

## 2023-10-15 LAB — COMPREHENSIVE METABOLIC PANEL WITH GFR
ALT: 13 U/L (ref 0–44)
AST: 25 U/L (ref 15–41)
Albumin: 4 g/dL (ref 3.5–5.0)
Alkaline Phosphatase: 64 U/L (ref 38–126)
Anion gap: 11 (ref 5–15)
BUN: 7 mg/dL — ABNORMAL LOW (ref 8–23)
CO2: 22 mmol/L (ref 22–32)
Calcium: 9 mg/dL (ref 8.9–10.3)
Chloride: 102 mmol/L (ref 98–111)
Creatinine, Ser: 0.7 mg/dL (ref 0.61–1.24)
GFR, Estimated: >60 mL/min (ref >60)
Glucose, Bld: 84 mg/dL (ref 70–99)
Potassium: 3.9 mmol/L (ref 3.5–5.1)
Sodium: 135 mmol/L (ref 135–145)
Total Bilirubin: 0.8 mg/dL (ref 0.0–1.2)
Total Protein: 7.6 g/dL (ref 6.5–8.1)

## 2023-10-15 LAB — CBC WITH DIFFERENTIAL/PLATELET
Abs Immature Granulocytes: 0.03 10*3/uL (ref 0.00–0.07)
Basophils Absolute: 0 10*3/uL (ref 0.0–0.1)
Basophils Relative: 1 %
Eosinophils Absolute: 0.1 10*3/uL (ref 0.0–0.5)
Eosinophils Relative: 1 %
HCT: 41 % (ref 39.0–52.0)
Hemoglobin: 13.4 g/dL (ref 13.0–17.0)
Immature Granulocytes: 1 %
Lymphocytes Relative: 58 %
Lymphs Abs: 3.3 10*3/uL (ref 0.7–4.0)
MCH: 31.5 pg (ref 26.0–34.0)
MCHC: 32.7 g/dL (ref 30.0–36.0)
MCV: 96.5 fL (ref 80.0–100.0)
Monocytes Absolute: 0.5 10*3/uL (ref 0.1–1.0)
Monocytes Relative: 9 %
Neutro Abs: 1.7 10*3/uL (ref 1.7–7.7)
Neutrophils Relative %: 30 %
Platelets: 280 10*3/uL (ref 150–400)
RBC: 4.25 MIL/uL (ref 4.22–5.81)
RDW: 20.6 % — ABNORMAL HIGH (ref 11.5–15.5)
WBC: 5.6 10*3/uL (ref 4.0–10.5)
nRBC: 0 % (ref 0.0–0.2)

## 2023-10-15 LAB — URIC ACID: Uric Acid, Serum: 6.4 mg/dL (ref 3.7–8.6)

## 2023-10-15 MED ORDER — COLCHICINE 0.6 MG PO TABS: 0.6000 mg | ORAL_TABLET | Freq: Every day | ORAL | 1 refills | Status: AC

## 2023-10-15 MED ORDER — HYDROCODONE-ACETAMINOPHEN 5-325 MG PO TABS
1.0000 | ORAL_TABLET | Freq: Once | ORAL | Status: AC
  Administered 2023-10-15: 1 via ORAL
  Filled 2023-10-15: qty 1

## 2023-10-15 NOTE — ED Provider Notes (Signed)
 [facility] Provider Note    Event Date/Time   First MD Initiated Contact with Patient 10/15/23 1305     (approximate)   History   Foot Pain   HPI  Darren Rogers is a 68 y.o. male   presents to the ED with complaint of right foot pain.  Patient reports he has a history of gout and has run out of his gout medication.  He presents to the ED via EMS who reported that patient had already had 2 "40s" earlier today.  Patient has a history of hypertension, polysubstance abuse, A-fib, CAD, non-STEMI, PAD, BPH, AKI, GERD, alcoholic cirrhosis and dilated cardiomyopathy.      Physical Exam   Triage Vital Signs: ED Triage Vitals  Encounter Vitals Group     BP 10/15/23 1202 116/72     Systolic BP Percentile --      Diastolic BP Percentile --      Pulse Rate 10/15/23 1202 74     Resp 10/15/23 1202 16     Temp 10/15/23 1202 97.7 F (36.5 C)     Temp Source 10/15/23 1202 Oral     SpO2 10/15/23 1202 100 %     Weight 10/15/23 1204 149 lb 14.6 oz (68 kg)     Height 10/15/23 1204 5\' 6"  (1.676 m)     Head Circumference --      Peak Flow --      Pain Score 10/15/23 1203 3     Pain Loc --      Pain Education --      Exclude from Growth Chart --     Most recent vital signs: Vitals:   10/15/23 1202  BP: 116/72  Pulse: 74  Resp: 16  Temp: 97.7 F (36.5 C)  SpO2: 100%     General: Awake, no distress.  CV:  Good peripheral perfusion.  Resp:  Normal effort.  Abd:  No distention.  Other:  Right foot without deformity but markedly tender to light touch especially over the first MP joint and distal metatarsal.  Skin is intact.  Fungal nails are present.  Slight warm to touch.  Pulses are present.   ED Results / Procedures / Treatments   Labs (all labs ordered are listed, but only abnormal results are displayed) Labs Reviewed  CBC WITH DIFFERENTIAL/PLATELET - Abnormal; Notable for the following components:      Result Value   RDW 20.6 (*)    All other  components within normal limits  COMPREHENSIVE METABOLIC PANEL WITH GFR - Abnormal; Notable for the following components:   BUN 7 (*)    All other components within normal limits  URIC ACID      PROCEDURES:  Critical Care performed:   Procedures   MEDICATIONS ORDERED IN ED: Medications  HYDROcodone -acetaminophen  (NORCO/VICODIN) 5-325 MG per tablet 1 tablet (has no administration in time range)     IMPRESSION / MDM / ASSESSMENT AND PLAN / ED COURSE  I reviewed the triage vital signs and the nursing notes.   Differential diagnosis includes, but is not limited to, acute gout flare, cellulitis, fracture, muscle skeletal pain.  68 year old male presents to the ED with complaint of flareup of his gout.  Patient does not have a PCP and states that his cardiologist is working to get him someone.  A referral was put in for him.  Exam is consistent with an acute gout flare.  Also spoke to patient about alcohol consumption increasing his gout attacks.  A prescription for colchicine  was sent to the pharmacy for him to begin taking.  This medication is what he has been taking in the past and is documented in multiple records.      Patient's presentation is most consistent with acute, uncomplicated illness.  FINAL CLINICAL IMPRESSION(S) / ED DIAGNOSES   Final diagnoses:  None     Rx / DC Orders   ED Discharge Orders          Ordered    colchicine  0.6 MG tablet  Daily        10/15/23 1330             Note:  This document was prepared using Dragon voice recognition software and may include unintentional dictation errors.   Stafford Eagles, PA-C 10/15/23 1343    Kandee Orion, MD 10/15/23 [phone]

## 2023-10-15 NOTE — ED Triage Notes (Signed)
 Patient c/o gout pain in right foot. Reports intermittent flares for months.  Arrived by EMS From home. EMS reports patient has drank two 40s (alcohol) today.  Reports he is out of his prescription for the gout

## 2023-10-15 NOTE — ED Notes (Signed)
 See triage notes. Patient c/o gout. Patient stated the pain is effecting his AFib

## 2023-10-15 NOTE — Discharge Instructions (Addendum)
 Follow up with Urgent Care if any continued problems. Get your medication at your pharmacy for your colchicine .  Also a referral has been put in for you to get a primary care provider.  You may get a phone call from the hospital.  As we discussed drinking alcohol will increase the frequency and pain of your gout.

## 2023-11-04 ENCOUNTER — Telehealth: Payer: Self-pay | Admitting: Family

## 2023-11-04 NOTE — Telephone Encounter (Signed)
 Called to confirm/remind patient of their appointment at the [facility] on 11/05/23.   Appointment:   [x] Confirmed  [] Left mess   [] No answer/No voice mail  [] VM Full/unable to leave message  [] Phone not in service  Patient reminded to bring all medications and/or complete list.  Confirmed patient has transportation. Gave directions, instructed to utilize valet parking.

## 2023-11-05 ENCOUNTER — Encounter: Admitting: Family

## 2023-11-05 ENCOUNTER — Telehealth: Payer: Self-pay | Admitting: Family

## 2023-11-05 NOTE — Progress Notes (Deleted)
 Advanced Heart Failure Clinic Note    PCP: Pcp, No Cardiologist: Antionette Kirks, MD / Laneta Pintos, NP (last seen 03/23)  Chief Complaint:    HPI:  Darren Rogers is a 68 y/o male with a history of persistent atrial fibrillation, nonobstructive CAD, hypertension, hyperlipidemia, noncompliance, and polysubstance abuse, hyponatremia, alcoholic liver cirrhosis and chronic heart failure.   Previously diagnosed with atrial fibrillation greater than 20 years ago and reports history of some type of ablation at [facility]. He had recurrent atrial fibrillation in August 2015, and in that setting, experience chest pain. He underwent diagnostic catheterization which showed mild, nonobstructive CAD. A-fib was previously managed with flecainide  and Xarelto  however, compliance was poor.   January 2017, he had recurrent chest pain and atrial fibrillation and underwent stress testing which was nonischemic. He also reported lower extremity claudication and underwent ABIs, which were abnormal with suggestion of mild to moderate bilateral SFA and right profunda stenoses. Recurrent atrial fibrillation in August 2018 in the setting of noncompliance and was resumed on metoprolol , flecainide , and Xarelto  however, was lost to follow-up.   Multiple admissions in 2022 in the setting of rapid atrial fibrillation and atypical chest pain in the setting of noncompliance. It was felt that A-fib was likely transitioning to permanent and a ZIO monitor was placed at discharge. This subsequently showed 100% atrial fibrillation burden with an average heart rate of 92 bpm.Again, did not continue follow-up.  Continued to have multiple admissions during 2024 for alcohol induced pancreatitis or atrial fibrillation. Most recent admission was 07/10/23 with increased shortness of breath, pedal edema and malaise. Drinks 48 oz beer BID and not taking his medications. Found to have anasarca, chest x-ray showed cardiomegaly with pulmonary edema,  significant elevated BNP. Patient was started on V diuretics for CHF exacerbation & then transitioned to oral diuretics. Echocardiogram with EF of 30 to 35%. Developed AF RVR and was placed on amiodarone  infusion & converted back to NSR. Converted to p.o. amiodarone , he should be taking 400 mg twice daily for next 5 days followed by 200 mg twice daily for 7 days and then 200 mg daily. Hyponatremia, hypokalemia and hypophosphatemia during hospitalization which were corrected before discharge.   Seen in [facility] 04/25 where entresto  24/26mg  BID was started.   Has been in the ED 4 times since last visit due to foot pain/ gout.   He presents today for a HF follow-up visit with a chief complaint of    Smoking 1/2 ppd cigarettes, was smoking 1 ppd. Has drank 1 beer recently. Denies drug use  ROS: All systems negative except what is listed in HPI, PMH and Problem List   Past Medical History:  Diagnosis Date   Chronic HFrEF (heart failure with reduced ejection fraction) (HCC)    a. 03/29/2014: EF 55-60%, mild LVH, normal RVSP;  b. 05/2015 Echo: EF 60-65%, triv AI, mild Darren; c. 03/2021 Echo: EF 55-60%, no rwma, mild LVH, nl RV fxn, mildly dil LA, mild Darren; d. 11/2022 Echo: EF 35-40%, mild LVH, nl RV fxn, RVSP 53.11mmhg. Mild BAE. Mild to mod Darren/TR/PR. Mild AI. Ao root 42mm.   Claudication (HCC)    a. 06/2015 ABI: R - 0.73, L - 0.73. 30-49% bilat SFA stenosis. 50-74% R Profunda stenosis; b. 01/2017 ABI: R 0.89, L 0.88, TBI R 0.65, L 1.0.   Erectile dysfunction    Essential hypertension    Gout    Hyperlipidemia    Non-obstructive CAD    a. 01/27/2014 Cath: LM nl, LAD  mild diff dz w/o obs, LCX no sig obs - scattered 20-30% mLCx, RCA no obs dz, EF 70%; b. 06/2015 MV; EF 60%, no ischemia; c. 07/2020 MV: No ischemia/scar; d. 11/2022 Cath: LM nl, LAD 35ost/p, LCX 20d, LPDA fills from dLAD, LPAV 100 CTO, RCA small, nl.   Persistent atrial fibrillation (HCC)    a. Pt says Dx >94yrs ago w/ ? RFCA @ [facility];  b. Recurrent  12/2013;  b. Rx Flecainide  and Xarelto -->Intermittent compliance; c. 03/2021 Zio: 100% Afib w/ avg rate of 99 bpm. Rare PVCs.   PVC's (premature ventricular contractions)    a. 05/2017 Zio: 4000 PVCs in 48 hrs (2%). Brief run of SVT.   Tobacco abuse    a. ongoing - 1 ppd.    Current Outpatient Medications  Medication Sig Dispense Refill   amiodarone  (PACERONE ) 200 MG tablet Take 2 tablets (400 mg total) by mouth 2 (two) times daily for 5 days, THEN 1 tablet (200 mg total) 2 (two) times daily for 7 days, THEN 1 tablet (200 mg total) daily. 112 tablet 0   apixaban  (ELIQUIS ) 5 MG TABS tablet Take 1 tablet (5 mg total) by mouth 2 (two) times daily. 180 tablet 3   atorvastatin  (LIPITOR) 40 MG tablet Take 1 tablet (40 mg total) by mouth daily. 90 tablet 1   colchicine  0.6 MG tablet Take 1 tablet (0.6 mg total) by mouth daily. 20 tablet 1   dapagliflozin  propanediol (FARXIGA ) 10 MG TABS tablet Take 1 tablet (10 mg total) by mouth daily. 90 tablet 2   digoxin  (LANOXIN ) 0.125 MG tablet Take 0.5 tablets (0.0625 mg total) by mouth daily. 45 tablet 1   furosemide  (LASIX ) 40 MG tablet Take 1 tablet (40 mg total) by mouth 2 (two) times daily. 180 tablet 2   gabapentin  (NEURONTIN ) 300 MG capsule Take 1 capsule (300 mg total) by mouth 3 (three) times daily as needed. 15 capsule 0   metoprolol  succinate (TOPROL -XL) 25 MG 24 hr tablet Take 1 tablet (25 mg total) by mouth 2 (two) times daily. 180 tablet 2   Multiple Vitamin (MULTIVITAMIN WITH MINERALS) TABS tablet Take 1 tablet by mouth daily. (Patient not taking: Reported on 09/07/2023)     nicotine  (NICODERM CQ  - DOSED IN MG/24 HOURS) 21 mg/24hr patch Place 1 patch (21 mg total) onto the skin daily. 28 patch 0   nitroGLYCERIN  (NITROSTAT ) 0.4 MG SL tablet Place 1 tablet (0.4 mg total) under the tongue every 5 (five) minutes x 3 doses as needed for chest pain. 30 tablet 0   pantoprazole  (PROTONIX ) 40 MG tablet Take 1 tablet (40 mg total) by mouth daily. 90 tablet 2    polyethylene glycol powder (GLYCOLAX /MIRALAX ) 17 GM/SCOOP powder Take 17 g by mouth daily as needed for mild constipation. Mix as directed. 238 g 0   sacubitril -valsartan  (ENTRESTO ) 24-26 MG Take 1 tablet by mouth 2 (two) times daily. 60 tablet 3   spironolactone  (ALDACTONE ) 25 MG tablet Take 0.5 tablets (12.5 mg total) by mouth daily. (Patient taking differently: Take by mouth daily.) 45 tablet 3   No current facility-administered medications for this visit.    No Known Allergies    Social History   Socioeconomic History   Marital status: Single    Spouse name: Not on file   Number of children: Not on file   Years of education: Not on file   Highest education level: 12th grade  Occupational History   Not on file  Tobacco Use  Smoking status: Every Day    Current packs/day: 0.50    Average packs/day: 0.5 packs/day for 30.0 years (15.0 ttl pk-yrs)    Types: Cigarettes   Smokeless tobacco: Never  Vaping Use   Vaping status: Never Used  Substance and Sexual Activity   Alcohol use: Yes    Comment: 4 (40 ounce beers a day)   Drug use: Not Currently    Types: Cocaine   Sexual activity: Not on file  Other Topics Concern   Not on file  Social History Narrative   The patient lives in [place]  with his sister. He works in a substance abuse program. He is a long-time smoker, one pack per day. ETOH use 4 ( 40oz beers a day).No illicit drugs. He is not married.   Social Drivers of Corporate investment banker Strain: Low Risk  (07/13/2023)   Overall Financial Resource Strain (CARDIA)    Difficulty of Paying Living Expenses: Not very hard  Food Insecurity: No Food Insecurity (07/13/2023)   Hunger Vital Sign    Worried About Running Out of Food in the Last Year: Never true    Ran Out of Food in the Last Year: Never true  Transportation Needs: Unmet Transportation Needs (07/13/2023)   PRAPARE - Administrator, Civil Service (Medical): Yes    Lack of  Transportation (Non-Medical): Yes  Physical Activity: Not on file  Stress: Not on file  Social Connections: Moderately Isolated (07/11/2023)   Social Connection and Isolation Panel [NHANES]    Frequency of Communication with Friends and Family: More than three times a week    Frequency of Social Gatherings with Friends and Family: Three times a week    Attends Religious Services: More than 4 times per year    Active Member of Clubs or Organizations: No    Attends Banker Meetings: Never    Marital Status: Never married  Intimate Partner Violence: Not At Risk (07/11/2023)   Humiliation, Afraid, Rape, and Kick questionnaire    Fear of Current or Ex-Partner: No    Emotionally Abused: No    Physically Abused: No    Sexually Abused: No      Family History  Problem Relation Age of Onset   Coronary artery disease Father 44   Diabetes Mother    Diabetes Sister    Diabetes Brother    Diabetes Maternal Grandmother    Diabetes Sister    Diabetes Sister       PHYSICAL EXAM:  General: Well appearing. No resp difficulty HEENT: normal Neck: supple, no JVD Cor: Regular rhythm, rate. No rubs, gallops. 2/6 systolic murmur Lungs: clear Abdomen: soft, nontender, nondistended. Extremities: no cyanosis, clubbing, rash, 1+ pitting edema right lower leg, right great toe tender to touch Neuro: alert & oriented X 3. Moves all 4 extremities w/o difficulty. Affect pleasant   ECG: not done   ASSESSMENT & PLAN:  1: Chronic heart failure with reduced ejection fraction- - suspect due to alcohol use/ atrial fibrillation/ obstructive CAD; cath 07/24 showed chronic total occlusion of distal LCx/LPLA - NYHA class II - euvolemic - weight 149 pounds from last visit here 2 months ago - Echo 07/13/23: EF 30-35%, RV moderately reduced, mildly elevated PA pressure, RA moderately dilated, moderate Darren, severe TR, mild AR - continue farxiga  10mg  daily - continue digoxin  0.0625mg  daily  -  continue furosemide  40mg  BID - continue metoprolol  succinate 25mg  BID; consider titration - continue spironolactone  12.5mg  daily; consider increasing  to 25mg  daily if labs remain stable - continue entresto  24/26mg  BID  - dig level 07/27/23 was 0.9 - encouraged to make lowest sodium options when eating out - BNP 07/17/23 reviewed and was 585.4  2: HTN- - BP  - still has no PCP as [facility] is not taking appointments - BMET 10/15/23 reviewed: sodium 135, potassium 3.9, creatinine 0.7 & GFR >60 - BMET today   3: Atrial fibrillation- - DCCV 02/25 - NS for [facility] appt 08/03/23 - continue amiodarone  200mg  daily  - continue apixaban  5mg  BID  4: Polysubstance use- - smokes 1/2 ppd cigarettes - 1 beer recently - denies drug use - cessation discussed  5: Nonobstructive CAD- - no angina - continue atorvastatin  40mg  daily - LDL 03/22/23 reviewed and was 106 - lipo (a) 12/21/22 was 235.2 - lipid panel today - R/LHC 12/23/22: Severe single-vessel coronary artery disease with chronic total occlusion of distal LCx/LPDA.  LPDA is supplied by left-to-left collaterals.  There is mild-moderate, non-obstructive disease involving the LAD. Upper normal to mildly elevated left and right heart filling pressures (LVEDP 17 mmHg, PCWP 18 mmHg, RA 7 mmHg). Moderate pulmonary hypertension (mean PA 37 mmHg, PVR 5 WU) Mildly reduced Fick cardiac output/index (CO 3.8 L/min, CI 2.1 L/min/m^2).  6: ? Gout/ right toe pain- - has had multiple ED visits with foot pain/ gout     Darren Console, FNP 11/05/23

## 2023-11-05 NOTE — Telephone Encounter (Signed)
 Patient did not show for his Heart Failure Clinic appointment on 11/05/23.

## 2023-11-19 ENCOUNTER — Other Ambulatory Visit: Payer: Self-pay | Admitting: Family

## 2023-11-19 ENCOUNTER — Other Ambulatory Visit: Payer: Self-pay

## 2023-12-20 ENCOUNTER — Other Ambulatory Visit: Payer: Self-pay

## 2024-01-02 ENCOUNTER — Emergency Department
Admission: EM | Admit: 2024-01-02 | Discharge: 2024-01-03 | Disposition: A | Discharge status: Home / Self Care | Attending: Emergency Medicine | Admitting: Emergency Medicine

## 2024-01-02 ENCOUNTER — Other Ambulatory Visit: Payer: Self-pay

## 2024-01-02 DIAGNOSIS — I110 Hypertensive heart disease with heart failure: Secondary | ICD-10-CM | POA: Insufficient documentation

## 2024-01-02 DIAGNOSIS — M109 Gout, unspecified: Secondary | ICD-10-CM | POA: Insufficient documentation

## 2024-01-02 DIAGNOSIS — I2510 Atherosclerotic heart disease of native coronary artery without angina pectoris: Secondary | ICD-10-CM | POA: Insufficient documentation

## 2024-01-02 DIAGNOSIS — M79671 Pain in right foot: Secondary | ICD-10-CM | POA: Insufficient documentation

## 2024-01-02 DIAGNOSIS — F1010 Alcohol abuse, uncomplicated: Secondary | ICD-10-CM | POA: Diagnosis not present

## 2024-01-02 DIAGNOSIS — X501XXA Overexertion from prolonged static or awkward postures, initial encounter: Secondary | ICD-10-CM | POA: Insufficient documentation

## 2024-01-02 DIAGNOSIS — Y908 Blood alcohol level of 240 mg/100 ml or more: Secondary | ICD-10-CM | POA: Insufficient documentation

## 2024-01-02 DIAGNOSIS — I509 Heart failure, unspecified: Secondary | ICD-10-CM | POA: Diagnosis not present

## 2024-01-02 DIAGNOSIS — I4891 Unspecified atrial fibrillation: Secondary | ICD-10-CM | POA: Diagnosis not present

## 2024-01-02 DIAGNOSIS — I252 Old myocardial infarction: Secondary | ICD-10-CM | POA: Insufficient documentation

## 2024-01-02 LAB — COMPREHENSIVE METABOLIC PANEL WITH GFR
ALT: 26 U/L (ref 0–44)
AST: 44 U/L — ABNORMAL HIGH (ref 15–41)
Albumin: 4.1 g/dL (ref 3.5–5.0)
Alkaline Phosphatase: 83 U/L (ref 38–126)
Anion gap: 11 (ref 5–15)
BUN: 5 mg/dL — ABNORMAL LOW (ref 8–23)
CO2: 24 mmol/L (ref 22–32)
Calcium: 8.9 mg/dL (ref 8.9–10.3)
Chloride: 98 mmol/L (ref 98–111)
Creatinine, Ser: 0.72 mg/dL (ref 0.61–1.24)
GFR, Estimated: >60 mL/min (ref >60)
Glucose, Bld: 70 mg/dL (ref 70–99)
Potassium: 4.5 mmol/L (ref 3.5–5.1)
Sodium: 133 mmol/L — ABNORMAL LOW (ref 135–145)
Total Bilirubin: 1.1 mg/dL (ref 0.0–1.2)
Total Protein: 7.8 g/dL (ref 6.5–8.1)

## 2024-01-02 LAB — CBC WITH DIFFERENTIAL/PLATELET
Abs Immature Granulocytes: 0.02 K/uL (ref 0.00–0.07)
Basophils Absolute: 0 K/uL (ref 0.0–0.1)
Basophils Relative: 1 %
Eosinophils Absolute: 0.1 K/uL (ref 0.0–0.5)
Eosinophils Relative: 1 %
HCT: 43.7 % (ref 39.0–52.0)
Hemoglobin: 14.9 g/dL (ref 13.0–17.0)
Immature Granulocytes: 0 %
Lymphocytes Relative: 39 %
Lymphs Abs: 2.3 K/uL (ref 0.7–4.0)
MCH: 31.4 pg (ref 26.0–34.0)
MCHC: 34.1 g/dL (ref 30.0–36.0)
MCV: 92 fL (ref 80.0–100.0)
Monocytes Absolute: 0.5 K/uL (ref 0.1–1.0)
Monocytes Relative: 8 %
Neutro Abs: 3.1 K/uL (ref 1.7–7.7)
Neutrophils Relative %: 51 %
Platelets: 208 K/uL (ref 150–400)
RBC: 4.75 MIL/uL (ref 4.22–5.81)
RDW: 14.8 % (ref 11.5–15.5)
WBC: 6.1 K/uL (ref 4.0–10.5)
nRBC: 0 % (ref 0.0–0.2)

## 2024-01-02 LAB — ETHANOL: Alcohol, Ethyl (B): 298 mg/dL — ABNORMAL HIGH (ref <15)

## 2024-01-02 MED ORDER — SODIUM CHLORIDE 0.9 % IV BOLUS
1000.0000 mL | Freq: Once | INTRAVENOUS | Status: AC
  Administered 2024-01-02: 1000 mL via INTRAVENOUS

## 2024-01-02 MED ORDER — PREDNISONE 20 MG PO TABS
60.0000 mg | ORAL_TABLET | Freq: Once | ORAL | Status: AC
  Administered 2024-01-02: 60 mg via ORAL
  Filled 2024-01-02: qty 3

## 2024-01-02 NOTE — ED Triage Notes (Signed)
 [place] via [facility]. Pt has right sided leg pain. Pt has hX of gout. Pt Is caox4, in no acute distress. Pt fell last night and thinks he broke his left toe. ED staff is familiar with patient. Vitals for EMS: 131/72 82 16 RR  99% RA

## 2024-01-02 NOTE — Discharge Instructions (Signed)
 Follow up with primary care.  Return to the ER for symptoms that change or worsen.

## 2024-01-02 NOTE — ED Provider Notes (Signed)
 [facility] Provider Note    Event Date/Time   First MD Initiated Contact with Patient 01/02/24 1957     (approximate)   History   Gout   HPI  Zebulan Hinshaw is a 68 y.o. male [place] via EMS from the woods where he was [place] beer. History of atrial fibrillation, NSTEMI, CAD, hypertension, alcohol abuse, cocaine abuse, pancreatitis, GERD, CHF and as listed in EMR presents to the emergency department for treatment and evaluation of right leg pain and right foot pain.  Patient states he got up last night and twisted his foot and ankle and has had pain since he states it feels like gout.   Physical Exam    Vitals:   01/02/24 1937  BP: 133/69  Pulse: 83  Resp: 18  Temp: (!) 97.5 F (36.4 C)  SpO2: 100%    General: Awake, no distress.  CV:  Good peripheral perfusion. DP pulses 2+ bilaterally. Resp:  Normal effort.  Abd:  No distention.  Other:     ED Results / Procedures / Treatments   Labs (all labs ordered are listed, but only abnormal results are displayed)  Labs Reviewed  COMPREHENSIVE METABOLIC PANEL WITH GFR - Abnormal; Notable for the following components:      Result Value   Sodium 133 (*)    BUN 5 (*)    AST 44 (*)    All other components within normal limits  ETHANOL - Abnormal; Notable for the following components:   Alcohol, Ethyl (B) 298 (*)    All other components within normal limits  CBC WITH DIFFERENTIAL/PLATELET  ETHANOL  TROPONIN I (HIGH SENSITIVITY)    EKG  Sinus rhythm, rate of 89.   RADIOLOGY  Image and radiology report reviewed and interpreted by me. Radiology report consistent with the same.  Not indicated.  PROCEDURES:  Critical Care performed: No  Procedures   MEDICATIONS ORDERED IN ED:  Medications  LORazepam  (ATIVAN ) tablet 0-4 mg (has no administration in time range)    Followed by  LORazepam  (ATIVAN ) tablet 0-4 mg (has no administration in time range)  predniSONE  (DELTASONE ) tablet 60  mg (60 mg Oral Given 01/02/24 2054)  sodium chloride  0.9 % bolus 1,000 mL (0 mLs Intravenous Stopped 01/03/24 0022)     IMPRESSION / MDM / ASSESSMENT AND PLAN / ED COURSE   I have reviewed the triage note and vital signs. Vital signs are stable.    Differential diagnosis includes, but is not limited to, gout, sprain, alcohol intoxication, polysubstance abuse  Patient's presentation is most consistent with acute illness / injury with system symptoms.  68 year old male presenting to the emergency department via EMS with complaint of right lower extremity pain.  Initially told me that his left foot was hurt then said it was the right. Either way, on exam there is no contusion, edema, deformity and he is observed moving ankles and toes. There is mild erythema over the MTP of the right great toe. He does seem intoxicated. ETOH level is pending.   Labs indicated a very mild hyponatremia with a sodium of 133, normal kidney function, AST of 44 with ALT of 26.  CBC is normal.  Alcohol is 298.  IV fluids ordered.  Patient requesting sandwich tray and was provided to him by his nurse.  Clinical Course as of 01/03/24 0049  Mon Jan 03, 2024  0012 Patient up for discharge. When Medic went in to give paperwork, he began to complain of chest pain.  He states it is sharp in nature. He has not taken any of his medications today and is not sure when he took them last. Cardiac workup started. CIWA entered as well. [CT]  [id number] Care relinquished to Dr. Willo. Patient to be moved to [facility]. [CT]    Clinical Course User Index [CT] Kipper Buch B, FNP     FINAL CLINICAL IMPRESSION(S) / ED DIAGNOSES   Final diagnoses:  Right foot pain     Rx / DC Orders   ED Discharge Orders     None        Note:  This document was prepared using Dragon voice recognition software and may include unintentional dictation errors.   Herlinda Kirk NOVAK, FNP 01/03/24 0050    Willo Dunnings, MD 01/03/24 [phone]

## 2024-01-03 ENCOUNTER — Emergency Department

## 2024-01-03 DIAGNOSIS — M79671 Pain in right foot: Secondary | ICD-10-CM | POA: Diagnosis not present

## 2024-01-03 LAB — ETHANOL: Alcohol, Ethyl (B): 179 mg/dL — ABNORMAL HIGH (ref <15)

## 2024-01-03 LAB — TROPONIN I (HIGH SENSITIVITY): Troponin I (High Sensitivity): 13 ng/L (ref <18)

## 2024-01-03 MED ORDER — LORAZEPAM 1 MG PO TABS
0.0000 mg | ORAL_TABLET | Freq: Four times a day (QID) | ORAL | Status: DC
  Administered 2024-01-03: 1 mg via ORAL
  Filled 2024-01-03: qty 1

## 2024-01-03 MED ORDER — KETOROLAC TROMETHAMINE 30 MG/ML IJ SOLN
15.0000 mg | Freq: Once | INTRAMUSCULAR | Status: AC
  Administered 2024-01-03: 15 mg via INTRAVENOUS
  Filled 2024-01-03: qty 1

## 2024-01-03 MED ORDER — LORAZEPAM 1 MG PO TABS: 0.0000 mg | ORAL_TABLET | Freq: Two times a day (BID) | ORAL | Status: DC

## 2024-01-03 MED ORDER — OXYCODONE-ACETAMINOPHEN 5-325 MG PO TABS
1.0000 | ORAL_TABLET | Freq: Once | ORAL | Status: DC
  Filled 2024-01-03: qty 1

## 2024-01-03 NOTE — ED Notes (Signed)
 Pt's fluids were finished when he stated that he was having chest pain. Pt states its left sided pain that doesn't radiate at this time.  Pt states it's a 10/10.  PA-C Cari notified of same.  Pt was noted to be sleeping when walked back into the room to obtain a EKG.

## 2024-01-03 NOTE — ED Notes (Signed)
 Dr. Suzanne made aware of pt elevated BP and dizziness. Dr. Suzanne stated it is okay to discharge pt and told pt to take his BP medication when he gets home.

## 2024-01-03 NOTE — ED Notes (Signed)
 Pt up to bathroom with assistance and W/C.

## 2024-01-03 NOTE — ED Notes (Signed)
 Report received from Jenn, Radiation protection practitioner.

## 2024-01-03 NOTE — ED Provider Notes (Signed)
-----------------------------------------   12:44 AM on 01/03/2024 -----------------------------------------  Blood pressure 133/69, pulse 83, temperature (!) 97.5 F (36.4 C), resp. rate 18, height 5' 6 (1.676 m), SpO2 100%.  Assuming care from NP Triplett.  In short, Darren Rogers is a 68 y.o. male with a chief complaint of Gout .  Refer to the original H&P for additional details.  The current plan of care is to follow-up labs for chest pain and reassess for sobriety.  ----------------------------------------- 4:09 AM on 01/03/2024 ----------------------------------------- Troponin within normal limits and chest x-ray is unremarkable.  On reassessment, patient now more awake and alert, complaining only of pain in his right foot and denies chest pain.  He reports no recent trauma to the foot, states pain is similar to prior gout flares.  We will treat with IV Toradol  and patient appropriate for discharge home with outpatient rheumatology follow-up.  He was counseled to return to the ED for new or worsening symptoms, patient agrees with plan.   Willo Dunnings, MD 01/03/24 [phone]

## 2024-01-03 NOTE — ED Notes (Signed)
 Unable to complete CIWA at this time d/t pt sleeping.

## 2024-01-03 NOTE — ED Notes (Signed)
 Unable to perform CIWA d/t pt sleeping at this time.

## 2024-01-06 ENCOUNTER — Other Ambulatory Visit: Payer: Self-pay

## 2024-01-06 ENCOUNTER — Emergency Department
Admission: EM | Admit: 2024-01-06 | Discharge: 2024-01-06 | Disposition: A | Discharge status: Home / Self Care | Attending: Emergency Medicine | Admitting: Emergency Medicine

## 2024-01-06 ENCOUNTER — Encounter: Payer: Self-pay | Admitting: Emergency Medicine

## 2024-01-06 DIAGNOSIS — M79671 Pain in right foot: Secondary | ICD-10-CM | POA: Insufficient documentation

## 2024-01-06 MED ORDER — ACETAMINOPHEN 500 MG PO TABS
1000.0000 mg | ORAL_TABLET | Freq: Once | ORAL | Status: AC
  Administered 2024-01-06: 1000 mg via ORAL
  Filled 2024-01-06: qty 2

## 2024-01-06 MED ORDER — COLCHICINE 0.6 MG PO TABS
1.2000 mg | ORAL_TABLET | Freq: Once | ORAL | Status: AC
  Administered 2024-01-06: 1.2 mg via ORAL
  Filled 2024-01-06 (×2): qty 2

## 2024-01-06 MED ORDER — PREDNISONE 20 MG PO TABS: 20.0000 mg | ORAL_TABLET | Freq: Every day | ORAL | 0 refills | Status: AC

## 2024-01-06 MED ORDER — COLCHICINE 0.6 MG PO TABS
0.6000 mg | ORAL_TABLET | Freq: Once | ORAL | Status: AC
  Administered 2024-01-06: 0.6 mg via ORAL
  Filled 2024-01-06 (×2): qty 1

## 2024-01-06 NOTE — ED Notes (Signed)
Patient is sleeping on stretcher. NAD. 

## 2024-01-06 NOTE — ED Notes (Signed)
 Cab voucher given to patient. Patient is waiting outside in the wheelchair for the cab. First Nurse Burnard is aware.

## 2024-01-06 NOTE — ED Notes (Signed)
 Pt c/o pain to his R foot and Leg

## 2024-01-06 NOTE — ED Triage Notes (Signed)
 Pt BIB [facility] from home for R foot pain and intoxication. Pt with Hx of gout and has been having chronic pain. Pt denies CP, SOB, N/V. Pt ABCs intact. RR even and unlabored. Pt in NAD.   Past Medical History:  Diagnosis Date   Chronic HFrEF (heart failure with reduced ejection fraction) (HCC)    a. 03/29/2014: EF 55-60%, mild LVH, normal RVSP;  b. 05/2015 Echo: EF 60-65%, triv AI, mild MR; c. 03/2021 Echo: EF 55-60%, no rwma, mild LVH, nl RV fxn, mildly dil LA, mild MR; d. 11/2022 Echo: EF 35-40%, mild LVH, nl RV fxn, RVSP 53.60mmhg. Mild BAE. Mild to mod MR/TR/PR. Mild AI. Ao root 42mm.   Claudication (HCC)    a. 06/2015 ABI: R - 0.73, L - 0.73. 30-49% bilat SFA stenosis. 50-74% R Profunda stenosis; b. 01/2017 ABI: R 0.89, L 0.88, TBI R 0.65, L 1.0.   Erectile dysfunction    Essential hypertension    Gout    Hyperlipidemia    Non-obstructive CAD    a. 01/27/2014 Cath: LM nl, LAD mild diff dz w/o obs, LCX no sig obs - scattered 20-30% mLCx, RCA no obs dz, EF 70%; b. 06/2015 MV; EF 60%, no ischemia; c. 07/2020 MV: No ischemia/scar; d. 11/2022 Cath: LM nl, LAD 35ost/p, LCX 20d, LPDA fills from dLAD, LPAV 100 CTO, RCA small, nl.   Persistent atrial fibrillation (HCC)    a. Pt says Dx >92yrs ago w/ ? RFCA @ [facility];  b. Recurrent 12/2013;  b. Rx Flecainide  and Xarelto -->Intermittent compliance; c. 03/2021 Zio: 100% Afib w/ avg rate of 99 bpm. Rare PVCs.   PVC's (premature ventricular contractions)    a. 05/2017 Zio: 4000 PVCs in 48 hrs (2%). Brief run of SVT.   Tobacco abuse    a. ongoing - 1 ppd.

## 2024-01-06 NOTE — ED Notes (Signed)
 Pt sleeping in stretcher. ABCs intact. RR even and unlabored. Pt in NAD. Bed in lowest locked position. Call bell in reach.

## 2024-01-06 NOTE — Discharge Instructions (Addendum)
 You were seen in the emergency department for foot pain.  You were intoxicated when you arrived to the emergency department.  It is importantly work on not drinking alcohol as this is likely causing a flareup of your gout.  Stay hydrated and drink plenty of fluids.  You can take NSAIDs for pain control.  You are given a short course of steroids to help with the pain.  Return for any worsening symptoms.  Pain control:  Ibuprofen  (motrin /aleve/advil ) - You can take 3 tablets (600 mg) every 6 hours as needed for pain/fever.  Acetaminophen  (tylenol ) - You can take 2 extra strength tablets (1000 mg) every 6 hours as needed for pain/fever.  You can alternate these medications or take them together.  Make sure you eat food/drink water when taking these medications.  Prednisone  -you are given a prescription for a steroid.  It is important that you take this medication with food.  This medication can cause an upset stomach.  It also can increase your glucose if you have a history of diabetes, so it is important that you check your glucose frequently while you are on this medication.

## 2024-01-06 NOTE — ED Notes (Signed)
 Patient was up to the bathroom via wheelchair.

## 2024-01-06 NOTE — ED Notes (Signed)
 VOL/Pt here medically

## 2024-01-06 NOTE — ED Notes (Signed)
 Pt resting comfortably in bed at this time. Pt was provided with snacks and drinks. Pt ABCs intact. RR even and unlabored. Pt in NAD. Bed in lowest locked position. Call bell in reach. Denies needs at this time.

## 2024-01-06 NOTE — ED Provider Notes (Signed)
 [facility] Provider Note    Event Date/Time   First MD Initiated Contact with Patient 01/06/24 [phone]     (approximate)   History   Foot Pain   HPI  Darren Rogers is a 68 y.o. male past medical history significant for gout, alcohol abuse, who presents to the emergency department with foot pain.  Patient states that he came into the emergency department for right foot pain.  Does endorse drinking alcohol tonight.  States that he has ongoing pain.  He has been taking ibuprofen  and Tylenol .  Denies any falls or trauma.  States that he has neuropathy.  States that this pain has been similar to times in the past.  On chart review patient was evaluated in the emergency department a couple of days ago for foot pain.  Patient had a workup at that time and was diagnosed with gout.  On chart review patient has been evaluated multiple times for gout.     Physical Exam   Triage Vital Signs: ED Triage Vitals  Encounter Vitals Group     BP 01/06/24 0325 139/76     Girls Systolic BP Percentile --      Girls Diastolic BP Percentile --      Boys Systolic BP Percentile --      Boys Diastolic BP Percentile --      Pulse Rate 01/06/24 0325 69     Resp 01/06/24 0325 16     Temp 01/06/24 0325 97.6 F (36.4 C)     Temp Source 01/06/24 0325 Oral     SpO2 01/06/24 0325 97 %     Weight 01/06/24 0330 149 lb 14.6 oz (68 kg)     Height 01/06/24 0330 5' 6 (1.676 m)     Head Circumference --      Peak Flow --      Pain Score 01/06/24 0330 8     Pain Loc --      Pain Education --      Exclude from Growth Chart --     Most recent vital signs: Vitals:   01/06/24 0325  BP: 139/76  Pulse: 69  Resp: 16  Temp: 97.6 F (36.4 C)  SpO2: 97%    Physical Exam Constitutional:      Appearance: He is well-developed.  HENT:     Head: Atraumatic.  Eyes:     Conjunctiva/sclera: Conjunctivae normal.  Cardiovascular:     Rate and Rhythm: Regular rhythm.  Pulmonary:      Effort: No respiratory distress.  Musculoskeletal:        General: Tenderness present. No deformity. Normal range of motion.     Cervical back: Normal range of motion.     Left lower leg: No edema.     Comments: Right foot tenderness to palpation with no overlying erythema or warmth.  No induration.  Skin:    General: Skin is warm.  Neurological:     Mental Status: He is alert. Mental status is at baseline.      IMPRESSION / MDM / ASSESSMENT AND PLAN / ED COURSE  I reviewed the triage vital signs and the nursing notes.  Differential diagnosis including gout, pseudogout, musculoskeletal strain, neuropathy.     Labs (all labs ordered are listed, but only abnormal results are displayed) Labs interpreted as -    Labs Reviewed - No data to display  On chart review patient had lab work done on 01/03/2024, patient had a troponin, alcohol level,  CBC and CMP at that time.  No significant electrolyte abnormalities.  Creatinine was at baseline.  Do not feel that repeat lab work would be helpful at this time.  Have a low suspicion for electrolyte abnormality or ACS, no chest pain or shortness of breath.  No new falls or trauma.  Do not feel that repeat imaging is necessary at this time.  Patient has a good pulse in that foot.  No concern for peripheral arterial disease.  Clinical picture is not consistent with a septic joint.  Given colchicine   Patient sleeping in the emergency department but whenever he wakes up states that he is not feeling any better.  Given a second dose of colchicine  and Tylenol   Again sleeping in the emergency department and eating food without any difficulties.  When he wakes up states that he still hurting.  Do a course of steroids to help with his gout.  I do not see any history of poorly controlled diabetes.  Discussed again to follow-up with his primary care physician and with rheumatology, he was given their information to follow-up during his last ER visit.      PROCEDURES:  Critical Care performed: No  Procedures  Patient's presentation is most consistent with acute complicated illness / injury requiring diagnostic workup.   MEDICATIONS ORDERED IN ED: Medications  acetaminophen  (TYLENOL ) tablet 1,000 mg (1,000 mg Oral Given 01/06/24 0402)  colchicine  tablet 1.2 mg (1.2 mg Oral Given 01/06/24 0402)  colchicine  tablet 0.6 mg (0.6 mg Oral Given 01/06/24 9391)    FINAL CLINICAL IMPRESSION(S) / ED DIAGNOSES   Final diagnoses:  Foot pain, right     Rx / DC Orders   ED Discharge Orders          Ordered    predniSONE  (DELTASONE ) 20 MG tablet  Daily with breakfast        01/06/24 9356             Note:  This document was prepared using Dragon voice recognition software and may include unintentional dictation errors.   Suzanne Kirsch, MD 01/06/24 [phone]

## 2024-03-25 ENCOUNTER — Emergency Department
Admission: EM | Admit: 2024-03-25 | Discharge: 2024-03-26 | Disposition: A | Discharge status: Home / Self Care | Attending: Emergency Medicine | Admitting: Emergency Medicine

## 2024-03-25 ENCOUNTER — Other Ambulatory Visit: Payer: Self-pay

## 2024-03-25 DIAGNOSIS — Y908 Blood alcohol level of 240 mg/100 ml or more: Secondary | ICD-10-CM | POA: Diagnosis not present

## 2024-03-25 DIAGNOSIS — M109 Gout, unspecified: Secondary | ICD-10-CM | POA: Diagnosis not present

## 2024-03-25 DIAGNOSIS — G629 Polyneuropathy, unspecified: Secondary | ICD-10-CM | POA: Diagnosis not present

## 2024-03-25 DIAGNOSIS — F10920 Alcohol use, unspecified with intoxication, uncomplicated: Secondary | ICD-10-CM

## 2024-03-25 DIAGNOSIS — F10129 Alcohol abuse with intoxication, unspecified: Secondary | ICD-10-CM | POA: Insufficient documentation

## 2024-03-25 DIAGNOSIS — E871 Hypo-osmolality and hyponatremia: Secondary | ICD-10-CM | POA: Diagnosis not present

## 2024-03-25 DIAGNOSIS — M79673 Pain in unspecified foot: Secondary | ICD-10-CM | POA: Diagnosis present

## 2024-03-25 LAB — URINE DRUG SCREEN, QUALITATIVE (ARMC ONLY)
Amphetamines, Ur Screen: NOT DETECTED
Barbiturates, Ur Screen: NOT DETECTED
Benzodiazepine, Ur Scrn: NOT DETECTED
Cannabinoid 50 Ng, Ur ~~LOC~~: NOT DETECTED
Cocaine Metabolite,Ur ~~LOC~~: NOT DETECTED
MDMA (Ecstasy)Ur Screen: NOT DETECTED
Methadone Scn, Ur: NOT DETECTED
Opiate, Ur Screen: NOT DETECTED
Phencyclidine (PCP) Ur S: NOT DETECTED
Tricyclic, Ur Screen: NOT DETECTED

## 2024-03-25 LAB — COMPREHENSIVE METABOLIC PANEL WITH GFR
ALT: 24 U/L (ref 0–44)
AST: 39 U/L (ref 15–41)
Albumin: 4.7 g/dL (ref 3.5–5.0)
Alkaline Phosphatase: 85 U/L (ref 38–126)
Anion gap: 13 (ref 5–15)
BUN: 6 mg/dL — ABNORMAL LOW (ref 8–23)
CO2: 23 mmol/L (ref 22–32)
Calcium: 9.4 mg/dL (ref 8.9–10.3)
Chloride: 92 mmol/L — ABNORMAL LOW (ref 98–111)
Creatinine, Ser: 0.75 mg/dL (ref 0.61–1.24)
GFR, Estimated: >60 mL/min (ref >60)
Glucose, Bld: 75 mg/dL (ref 70–99)
Potassium: 3.8 mmol/L (ref 3.5–5.1)
Sodium: 128 mmol/L — ABNORMAL LOW (ref 135–145)
Total Bilirubin: 0.6 mg/dL (ref 0.0–1.2)
Total Protein: 8.5 g/dL — ABNORMAL HIGH (ref 6.5–8.1)

## 2024-03-25 LAB — CBC
HCT: 42.6 % (ref 39.0–52.0)
Hemoglobin: 14.8 g/dL (ref 13.0–17.0)
MCH: 32.1 pg (ref 26.0–34.0)
MCHC: 34.7 g/dL (ref 30.0–36.0)
MCV: 92.4 fL (ref 80.0–100.0)
Platelets: 273 K/uL (ref 150–400)
RBC: 4.61 MIL/uL (ref 4.22–5.81)
RDW: 16.7 % — ABNORMAL HIGH (ref 11.5–15.5)
WBC: 6.9 K/uL (ref 4.0–10.5)
nRBC: 0 % (ref 0.0–0.2)

## 2024-03-25 LAB — ETHANOL: Alcohol, Ethyl (B): 293 mg/dL — ABNORMAL HIGH (ref <15)

## 2024-03-25 MED ORDER — SODIUM CHLORIDE 0.9 % IV BOLUS: 1000.0000 mL | Freq: Once | INTRAVENOUS | Status: DC

## 2024-03-25 MED ORDER — COLCHICINE 0.6 MG PO TABS: 0.6000 mg | ORAL_TABLET | Freq: Once | ORAL | Status: DC

## 2024-03-25 MED ORDER — COLCHICINE 0.6 MG PO TABS
1.2000 mg | ORAL_TABLET | Freq: Once | ORAL | Status: AC
Start: 2024-03-25 — End: 2024-03-25
  Administered 2024-03-25: 1.2 mg via ORAL
  Filled 2024-03-25: qty 2

## 2024-03-25 NOTE — ED Notes (Signed)
 Patient dressed out in triage with tech and security, belongings secured.

## 2024-03-25 NOTE — ED Notes (Signed)
 Patient given a sandwich tray. No acute needs at this time.

## 2024-03-25 NOTE — ED Provider Notes (Incomplete)
 [facility] Provider Note    Event Date/Time   First MD Initiated Contact with Patient 03/25/24 2303     (approximate)   History   Psychiatric Evaluation, Depression, and Alcohol Problem   HPI  Darren Rogers is a 68 y.o. male   Past medical history of alcohol use and gout who presents to the emergency department with alcohol intoxication and foot pain.  He also has near longstanding neuropathy in his pain from his right buttocks all the way down to his leg.  He admits to drinking alcohol tonight.  In triage he mentioned passive suicidal ideation but he makes no report of this to me, stating that he only wants to be treated for the pain in his foot and leg.  He reports no new trauma.  He reports no falls.  He reports no other coingestions.  He denies SI HI or AVH.  He states the colchicine  has worked well for him in the past.   External Medical Documents Reviewed: Previous hospital notes      Physical Exam   Triage Vital Signs: ED Triage Vitals  Encounter Vitals Group     BP 03/25/24 2126 (!) 160/96     Girls Systolic BP Percentile --      Girls Diastolic BP Percentile --      Boys Systolic BP Percentile --      Boys Diastolic BP Percentile --      Pulse Rate 03/25/24 2126 79     Resp 03/25/24 2126 16     Temp 03/25/24 2126 97.7 F (36.5 C)     Temp Source 03/25/24 2126 Oral     SpO2 03/25/24 2126 100 %     Weight 03/25/24 2125 149 lb 14.6 oz (68 kg)     Height 03/25/24 2125 5' 6 (1.676 m)     Head Circumference --      Peak Flow --      Pain Score 03/25/24 2127 2     Pain Loc --      Pain Education --      Exclude from Growth Chart --     Most recent vital signs: Vitals:   03/25/24 2126  BP: (!) 160/96  Pulse: 79  Resp: 16  Temp: 97.7 F (36.5 C)  SpO2: 100%    General: Awake, no distress.  CV:  Good peripheral perfusion.  Resp:  Normal effort.  Abd:  No distention.  Other:  Awake alert, hypertensive otherwise vital  signs normal.  No obvious swelling redness warmth to the affected right foot.  Neurovascular intact, ranging in all directions at the toes, ankles and knee.   ED Results / Procedures / Treatments   Labs (all labs ordered are listed, but only abnormal results are displayed) Labs Reviewed  COMPREHENSIVE METABOLIC PANEL WITH GFR - Abnormal; Notable for the following components:      Result Value   Sodium 128 (*)    Chloride 92 (*)    BUN 6 (*)    Total Protein 8.5 (*)    All other components within normal limits  ETHANOL - Abnormal; Notable for the following components:   Alcohol, Ethyl (B) 293 (*)    All other components within normal limits  CBC - Abnormal; Notable for the following components:   RDW 16.7 (*)    All other components within normal limits  URINE DRUG SCREEN, QUALITATIVE ([facility] ONLY)     I ordered and reviewed the above labs they  are notable for alcohol of 293 and sodium 128.    PROCEDURES:  Critical Care performed: No  Procedures   MEDICATIONS ORDERED IN ED: Medications  sodium chloride  0.9 % bolus 1,000 mL (1,000 mLs Intravenous Not Given 03/25/24 2353)  colchicine  tablet 1.2 mg (1.2 mg Oral Given 03/25/24 2339)    IMPRESSION / MDM / ASSESSMENT AND PLAN / ED COURSE  I reviewed the triage vital signs and the nursing notes.                                Patient's presentation is most consistent with acute presentation with potential threat to life or bodily function.  Differential diagnosis includes, but is not limited to, gout, septic joint, trauma, injury, neuropathy, alcohol intoxication, considered but less likely acute decompensated psychiatric illness   MDM:   History of gout with a gout flare reported by patient pain in the first MTP area of the foot consistent with gout, no clinical signs consistent with more sinister etiologies like a septic joint and no reported injuries to suggest fractures dislocations etc.  Neurovascular intact and  ischemia.  Will treat with colchicine .  He is also intoxicated.  He has some hyponatremia probably associated with his alcohol use will give a liter of normal saline.  He is intoxicated and will be held for sobriety reassessment prior to disposition.  He reported some passive suicidal ideation to triage but denies to me.  I see no reason to IVC him at this time.  Pain well-controlled resting comfortably throughout the night in the ED.  Safer discharge in the morning.        FINAL CLINICAL IMPRESSION(S) / ED DIAGNOSES   Final diagnoses:  Acute gout of right foot, unspecified cause  Alcoholic intoxication without complication     Rx / DC Orders   ED Discharge Orders     None        Note:  This document was prepared using Dragon voice recognition software and may include unintentional dictation errors.    Cyrena Mylar, MD 03/26/24 9944    Cyrena Mylar, MD 03/26/24 [phone]

## 2024-03-25 NOTE — ED Triage Notes (Signed)
 Patient BIB [place] [facility] with complaint of depression and ETOH abuse.   Last alcohol consumed at 2100.   Patient denies SI thoughts

## 2024-03-26 NOTE — Discharge Instructions (Addendum)
 Take acetaminophen 650 mg and ibuprofen 400 mg every 6 hours for pain.  Take with food.  Thank you for choosing [place] for your health care today!  Please see your primary doctor this week for a follow up appointment.   If you have any new, worsening, or unexpected symptoms call your doctor right away or come back to the emergency department for reevaluation.  It was my pleasure to care for you today.   Daneil Dan Modesto Charon, MD

## 2024-03-26 NOTE — ED Notes (Signed)
 Pt given patient belongings bag, in the bathroom at this time changing clothes.

## 2024-03-26 NOTE — ED Notes (Signed)
 Spoke with patient regarding discharge plans. Pt refuses to allow staff to call the one emergency contact who is able to drive. Pt states they usually just give me a cab voucher to get me home. This RN explained cab vouchers were not a guaruntee. Pt states understanding.

## 2024-03-26 NOTE — ED Notes (Signed)
 Pt upset about being discharged. Pt states this isn't right, I haven't even been here 24 hours and your already discharging me. I'm gonna have to contact a lawyer about this.  Pt educated about medication compliance and alcohol abuse.

## 2024-03-26 NOTE — ED Notes (Signed)
 Pt called out asking for a towel with the hottest water you can put because his leg was cramping real bad. Pt given 2 hot packs to relieve pain/cramps in RLE. Pt grateful with no further requests at this time.

## 2024-06-24 ENCOUNTER — Emergency Department

## 2024-06-24 ENCOUNTER — Emergency Department
Admission: EM | Admit: 2024-06-24 | Discharge: 2024-06-24 | Disposition: A | Discharge status: Home / Self Care | Attending: Emergency Medicine | Admitting: Emergency Medicine

## 2024-06-24 DIAGNOSIS — M79671 Pain in right foot: Secondary | ICD-10-CM | POA: Diagnosis present

## 2024-06-24 DIAGNOSIS — B353 Tinea pedis: Secondary | ICD-10-CM | POA: Diagnosis not present

## 2024-06-24 LAB — BASIC METABOLIC PANEL WITH GFR
Anion gap: 18 — ABNORMAL HIGH (ref 5–15)
Anion gap: 11 (ref 5–15)
BUN: <5 mg/dL — ABNORMAL LOW (ref 8–23)
BUN: 5 mg/dL — ABNORMAL LOW (ref 8–23)
CO2: 17 mmol/L — ABNORMAL LOW (ref 22–32)
CO2: 24 mmol/L (ref 22–32)
Calcium: 9.1 mg/dL (ref 8.9–10.3)
Calcium: 8.8 mg/dL — ABNORMAL LOW (ref 8.9–10.3)
Chloride: 95 mmol/L — ABNORMAL LOW (ref 98–111)
Chloride: 102 mmol/L (ref 98–111)
Creatinine, Ser: 0.81 mg/dL (ref 0.61–1.24)
Creatinine, Ser: 0.86 mg/dL (ref 0.61–1.24)
GFR, Estimated: >60 mL/min
GFR, Estimated: >60 mL/min
Glucose, Bld: 92 mg/dL (ref 70–99)
Glucose, Bld: 163 mg/dL — ABNORMAL HIGH (ref 70–99)
Potassium: 4.8 mmol/L (ref 3.5–5.1)
Potassium: 3.6 mmol/L (ref 3.5–5.1)
Sodium: 130 mmol/L — ABNORMAL LOW (ref 135–145)
Sodium: 138 mmol/L (ref 135–145)

## 2024-06-24 LAB — HEPATIC FUNCTION PANEL
ALT: 24 U/L (ref 0–44)
AST: 36 U/L (ref 15–41)
Albumin: 4.3 g/dL (ref 3.5–5.0)
Alkaline Phosphatase: 87 U/L (ref 38–126)
Bilirubin, Direct: 0.2 mg/dL (ref 0.0–0.2)
Indirect Bilirubin: 0.1 mg/dL — ABNORMAL LOW (ref 0.3–0.9)
Total Bilirubin: 0.3 mg/dL (ref 0.0–1.2)
Total Protein: 7.1 g/dL (ref 6.5–8.1)

## 2024-06-24 LAB — TROPONIN T, HIGH SENSITIVITY
Troponin T High Sensitivity: 10 ng/L (ref 0–19)
Troponin T High Sensitivity: 9 ng/L (ref 0–19)

## 2024-06-24 LAB — CBC WITH DIFFERENTIAL/PLATELET
Abs Immature Granulocytes: 0.02 10*3/uL (ref 0.00–0.07)
Basophils Absolute: 0.1 10*3/uL (ref 0.0–0.1)
Basophils Relative: 1 %
Eosinophils Absolute: 0.1 10*3/uL (ref 0.0–0.5)
Eosinophils Relative: 1 %
HCT: 41.2 % (ref 39.0–52.0)
Hemoglobin: 14.5 g/dL (ref 13.0–17.0)
Immature Granulocytes: 0 %
Lymphocytes Relative: 56 %
Lymphs Abs: 2.8 10*3/uL (ref 0.7–4.0)
MCH: 32.4 pg (ref 26.0–34.0)
MCHC: 35.2 g/dL (ref 30.0–36.0)
MCV: 92.2 fL (ref 80.0–100.0)
Monocytes Absolute: 0.2 10*3/uL (ref 0.1–1.0)
Monocytes Relative: 4 %
Neutro Abs: 1.9 10*3/uL (ref 1.7–7.7)
Neutrophils Relative %: 38 %
Platelets: 230 10*3/uL (ref 150–400)
RBC: 4.47 MIL/uL (ref 4.22–5.81)
RDW: 14.8 % (ref 11.5–15.5)
WBC: 5 10*3/uL (ref 4.0–10.5)
nRBC: 0 % (ref 0.0–0.2)

## 2024-06-24 LAB — MAGNESIUM: Magnesium: 2.2 mg/dL (ref 1.7–2.4)

## 2024-06-24 LAB — PHOSPHORUS: Phosphorus: 2.9 mg/dL (ref 2.5–4.6)

## 2024-06-24 LAB — BETA-HYDROXYBUTYRIC ACID: Beta-Hydroxybutyric Acid: 0.13 mmol/L (ref 0.05–0.27)

## 2024-06-24 MED ORDER — THIAMINE HCL 100 MG/ML IJ SOLN
100.0000 mg | Freq: Once | INTRAMUSCULAR | Status: AC
  Administered 2024-06-24: 100 mg via INTRAVENOUS
  Filled 2024-06-24: qty 2

## 2024-06-24 MED ORDER — DEXTROSE-SODIUM CHLORIDE 5-0.9 % IV SOLN: INTRAVENOUS | Status: DC

## 2024-06-24 NOTE — ED Notes (Signed)
 Pt given DC instructions. Pt verbalized understanding of follow up care. Pt taken from ED in wheelchair by this RN.

## 2024-06-24 NOTE — ED Triage Notes (Addendum)
 Pt to ED from home BIB [facility] with c/o right foot pain. Pt reports the pain has been ongoing for the last 3 years but swelling became worse over night. Hx of neuropathy and gout. Foot noted to be dry with scabs. Denies injury, Pulses intact. Reports taking aspirin  for pain today with no relief. Per EMS, pt's cbg was 58, gave 15g oral glucose, repeat was 79.

## 2024-06-24 NOTE — ED Notes (Signed)
"   Provided patient with a second turkey sandwich and ginger ale per request. "

## 2024-06-24 NOTE — ED Provider Notes (Signed)
 "  [facility] Provider Note    Event Date/Time   First MD Initiated Contact with Patient 06/24/24 0149     (approximate)   History   Foot Pain   HPI Darren Rogers is a 69 y.o. male who presents by EMS reporting right foot pain.  He said that he has had issues for the last 3 years but the foot has become more painful lately and he is having trouble putting weight on it.  He thinks he has had a little bit of swelling.  He said he does not look at his foot regularly but the skin has been changing.  He does not go to a doctor for the foot problems.  He has a history of both neuropathy and gout.  Of note, he said that he does not have diabetes but his blood glucose was low according to EMS and they gave him 15 g of oral glucose.  However he was having no obvious symptoms as a result of this issue.     Physical Exam   Triage Vital Signs: ED Triage Vitals  Encounter Vitals Group     BP 06/24/24 0148 (!) 159/82     Girls Systolic BP Percentile --      Girls Diastolic BP Percentile --      Boys Systolic BP Percentile --      Boys Diastolic BP Percentile --      Pulse Rate 06/24/24 0200 68     Resp 06/24/24 0200 14     Temp 06/24/24 0148 97.7 F (36.5 C)     Temp src --      SpO2 06/24/24 0147 100 %     Weight 06/24/24 0149 69.5 kg (153 lb 4.8 oz)     Height 06/24/24 0149 1.676 m (5' 6)     Head Circumference --      Peak Flow --      Pain Score --      Pain Loc --      Pain Education --      Exclude from Growth Chart --     Most recent vital signs: Vitals:   06/24/24 0615 06/24/24 0617  BP: 138/81 (!) 142/59  Pulse: 97   Resp: 20 18  Temp:  97.9 F (36.6 C)  SpO2: 96% 95%    General: Awake, alert, pleasant and conversant.  Patient is asking to stay in the hospital for his foot pain and also going for a meal tray or a sandwich. CV:  Good peripheral perfusion.  Regular rate and rhythm. Resp:  Normal effort. Speaking easily and comfortably, no  accessory muscle usage nor intercostal retractions.   Abd:  No distention.  Other:  Patient has very dry and flaky skin on both of his feet.  However the right foot appears worse with some mild swelling and an obvious line of demarcation which I suspect represents the border of tinea infection as it has covered most of the plantar surface of the foot and between the toes.  He has 1 small piece of skin that is peeled away and is causing some pain at the bottom of the foot.  See photo     ED Results / Procedures / Treatments   Labs (all labs ordered are listed, but only abnormal results are displayed) Labs Reviewed  BASIC METABOLIC PANEL WITH GFR - Abnormal; Notable for the following components:      Result Value   Sodium 130 (*)  Chloride 95 (*)    CO2 17 (*)    BUN <5 (*)    Anion gap 18 (*)    All other components within normal limits  HEPATIC FUNCTION PANEL - Abnormal; Notable for the following components:   Indirect Bilirubin 0.1 (*)    All other components within normal limits  CBC WITH DIFFERENTIAL/PLATELET  BETA-HYDROXYBUTYRIC ACID  MAGNESIUM   PHOSPHORUS  CBC WITH DIFFERENTIAL/PLATELET  BASIC METABOLIC PANEL WITH GFR  TROPONIN T, HIGH SENSITIVITY  TROPONIN T, HIGH SENSITIVITY    ED ECG REPORT I, Darleene Dome, the attending physician, personally viewed and interpreted this ECG.  Date: 06/24/2024 EKG Time: 1:52 AM Rate: 67 Rhythm: normal sinus rhythm QRS Axis: normal Intervals: normal ST/T Wave abnormalities: Non-specific ST segment / T-wave changes, but no clear evidence of acute ischemia. Narrative Interpretation: no definitive evidence of acute ischemia; does not meet STEMI criteria.  ED ECG REPORT I, Darleene Dome, the attending physician, personally viewed and interpreted this ECG.  Date: 06/24/2024 EKG Time: 6:09 AM Rate: 96 Rhythm: A-flutter QRS Axis: normal Intervals: Normal other than A-fib ST/T Wave abnormalities: Non-specific ST segment / T-wave  changes, but no clear evidence of acute ischemia. Narrative Interpretation: no definitive evidence of acute ischemia; does not meet STEMI criteria. Yeah definitely   RADIOLOGY See ED course for details   PROCEDURES:  Critical Care performed: No  Procedures    IMPRESSION / MDM / ASSESSMENT AND PLAN / ED COURSE  I reviewed the triage vital signs and the nursing notes.                              Differential diagnosis includes, but is not limited to, tinea pedis, foot cellulitis, osteomyelitis  Patient's presentation is most consistent with acute presentation with potential threat to life or bodily function.  Labs/studies ordered (see ED course for additional labs and studies that may have been added later): BMP, CBC, foot x-rays  Interventions/Medications given:  Medications  dextrose  5 %-0.9 % sodium chloride  infusion ( Intravenous New Bag/Given 06/24/24 0611)  thiamine  (VITAMIN B1) injection 100 mg (100 mg Intravenous Given 06/24/24 0610)    (Note:  hospital course my include additional interventions and/or labs/studies not listed above.)   Patient has chronic neuropathy and gout but this does not look like gout.  I think he has a significant fungal infection that has gotten increasingly sensitive over time.  Out of an abundance of caution I will check foot x-rays to make sure there is no evidence of foreign body or osteomyelitis so we will verify his renal function and that he has no significant leukocytosis, but anticipate he may benefit from topical antifungal treatment as a first line of treatment.  I explained to the patient multiple times that he must follow-up with a podiatrist and that I will give him information.  He wants to be admitted but I explained that there is no indication at this time that he meets criteria for inpatient treatment.     Clinical Course as of 06/24/24 0718  Sat Jun 24, 2024  0433 CBC with Differential/Platelet Normal CBC [CF]  0434 DG  Foot Complete Right I independently viewed and interpreted the patient's foot x-rays.  He has generalized osteopenia but no evidence of acute fracture or dislocation.  Of note he has a foreign body in the plantar surface but the radiologist confirmed that it has been there since at least last year (demonstrated and  an x-ray from Oct 01, 2023 [CF]  0556 Patient's labs are notable for hyponatremia, CO2 of 17, and anion gap of 18.  His vital signs are stable, but I went back and ask him and he confirmed that he drinks at least 3 40-ounce cans of beer a day.  Difficult to know at this point whether he is suffering from a little bit of alcoholic ketoacidosis versus beer Potomania.  He has eaten 2 meal trays since getting here and is tolerating oral intake without difficulty and his issues seem more chronic than acute.  However, given the lab abnormalities, I am adding on a beta hydroxybutyric acid, magnesium , and phosphorus levels, and I have ordered thiamine  100 mg IV and D5 normal saline 1 L IV bolus.    I anticipate that we will recheck a metabolic panel about 7:30 AM to see if his issues have corrected, if not, it may be necessary to admit him.  He understands and agrees with the plan. [CF]  [phone] The patient complained of chest pain.  EKG was repeated and shows some ST depression in the lateral leads [CF]  0643 Beta-Hydroxybutyric Acid: 0.13 Within normal limits [CF]  0717 Add-on troponin was negative.  Transferring ED care to Dr. Rexford to reassess after repeat lab work at about 7:30 AM [CF]    Clinical Course User Index [CF] Gordan Huxley, MD      FINAL CLINICAL IMPRESSION(S) / ED DIAGNOSES   Final diagnoses:  Foot pain, right  Tinea pedis of both feet     Rx / DC Orders   ED Discharge Orders          Ordered    Ambulatory Referral to Primary Care (Establish Care)        06/24/24 0452             Note:  This document was prepared using Dragon voice recognition software and  may include unintentional dictation errors.   Gordan Huxley, MD 06/24/24 [phone]  "

## 2024-06-24 NOTE — ED Notes (Signed)
 Reports of chest pain 10/10 that just came on. MD made aware, this RN performed an EKG and gave to provider.

## 2024-06-24 NOTE — Discharge Instructions (Addendum)
 Your evaluation was generally reassuring and you need to start treating your feet for fungal infection.  We recommend you use an over-the-counter medications such as terbinafine (Lamisil AT, or something similar) according to the instructions on the box.  In the meantime, it is important that you call the office of Dr. Lennie and schedule a follow-up appointment with him or one of his podiatry colleagues.  You have had a long-term issue with your feet and you need to see a specialist to help you get it better.  We have also referred you to the primary care community so you can get set up with a primary care provider, but if you already have 1, we recommend you follow-up with that provider at the next available opportunity.
